# Patient Record
Sex: Male | Born: 1945 | Race: White | Hispanic: No | Marital: Married | Smoking: Former smoker
Health system: Southern US, Community
[De-identification: ages and names within clinical notes are randomized; demographics above are authoritative.]

## PROBLEM LIST (undated history)

## (undated) DIAGNOSIS — K589 Irritable bowel syndrome without diarrhea: Secondary | ICD-10-CM

## (undated) DIAGNOSIS — J309 Allergic rhinitis, unspecified: Secondary | ICD-10-CM

## (undated) DIAGNOSIS — I4891 Unspecified atrial fibrillation: Secondary | ICD-10-CM

## (undated) DIAGNOSIS — K219 Gastro-esophageal reflux disease without esophagitis: Secondary | ICD-10-CM

## (undated) DIAGNOSIS — E785 Hyperlipidemia, unspecified: Secondary | ICD-10-CM

## (undated) DIAGNOSIS — I209 Angina pectoris, unspecified: Secondary | ICD-10-CM

## (undated) DIAGNOSIS — G2581 Restless legs syndrome: Secondary | ICD-10-CM

## (undated) DIAGNOSIS — Z8719 Personal history of other diseases of the digestive system: Secondary | ICD-10-CM

## (undated) DIAGNOSIS — I499 Cardiac arrhythmia, unspecified: Secondary | ICD-10-CM

## (undated) DIAGNOSIS — I739 Peripheral vascular disease, unspecified: Secondary | ICD-10-CM

## (undated) DIAGNOSIS — M1990 Unspecified osteoarthritis, unspecified site: Secondary | ICD-10-CM

## (undated) DIAGNOSIS — J449 Chronic obstructive pulmonary disease, unspecified: Secondary | ICD-10-CM

## (undated) DIAGNOSIS — I2510 Atherosclerotic heart disease of native coronary artery without angina pectoris: Secondary | ICD-10-CM

## (undated) DIAGNOSIS — J45909 Unspecified asthma, uncomplicated: Secondary | ICD-10-CM

## (undated) DIAGNOSIS — I219 Acute myocardial infarction, unspecified: Secondary | ICD-10-CM

## (undated) DIAGNOSIS — R7303 Prediabetes: Secondary | ICD-10-CM

## (undated) DIAGNOSIS — E039 Hypothyroidism, unspecified: Secondary | ICD-10-CM

## (undated) DIAGNOSIS — T7840XA Allergy, unspecified, initial encounter: Secondary | ICD-10-CM

## (undated) DIAGNOSIS — K4190 Unilateral femoral hernia, without obstruction or gangrene, not specified as recurrent: Secondary | ICD-10-CM

## (undated) DIAGNOSIS — I2699 Other pulmonary embolism without acute cor pulmonale: Secondary | ICD-10-CM

## (undated) DIAGNOSIS — J42 Unspecified chronic bronchitis: Secondary | ICD-10-CM

## (undated) DIAGNOSIS — I10 Essential (primary) hypertension: Secondary | ICD-10-CM

## (undated) HISTORY — DX: Allergy, unspecified, initial encounter: T78.40XA

## (undated) HISTORY — DX: Allergic rhinitis, unspecified: J30.9

## (undated) HISTORY — PX: OTHER SURGICAL HISTORY: SHX169

## (undated) HISTORY — PX: CORONARY ANGIOPLASTY: SHX604

## (undated) HISTORY — PX: FEMORAL ARTERY STENT: SHX1583

## (undated) HISTORY — PX: EYE SURGERY: SHX253

## (undated) HISTORY — PX: CORONARY ANGIOPLASTY WITH STENT PLACEMENT: SHX49

## (undated) HISTORY — PX: UPPER GASTROINTESTINAL ENDOSCOPY: SHX188

## (undated) HISTORY — DX: Hyperlipidemia, unspecified: E78.5

## (undated) HISTORY — PX: DOPPLER ECHOCARDIOGRAPHY: SHX263

## (undated) HISTORY — PX: TONSILLECTOMY: SUR1361

---

## 1995-05-19 DIAGNOSIS — I219 Acute myocardial infarction, unspecified: Secondary | ICD-10-CM

## 1995-05-19 DIAGNOSIS — I209 Angina pectoris, unspecified: Secondary | ICD-10-CM

## 1995-05-19 HISTORY — DX: Acute myocardial infarction, unspecified: I21.9

## 1995-05-19 HISTORY — DX: Angina pectoris, unspecified: I20.9

## 1999-04-24 ENCOUNTER — Encounter: Payer: Self-pay | Admitting: Cardiovascular Disease

## 1999-04-24 ENCOUNTER — Ambulatory Visit (HOSPITAL_COMMUNITY): Admission: RE | Admit: 1999-04-24 | Discharge: 1999-04-24 | Payer: Self-pay | Admitting: Cardiovascular Disease

## 1999-12-30 ENCOUNTER — Encounter: Payer: Self-pay | Admitting: Emergency Medicine

## 1999-12-30 ENCOUNTER — Emergency Department (HOSPITAL_COMMUNITY): Admission: EM | Admit: 1999-12-30 | Discharge: 1999-12-30 | Payer: Self-pay | Admitting: Emergency Medicine

## 2002-05-18 HISTORY — PX: CARDIAC CATHETERIZATION: SHX172

## 2002-07-03 ENCOUNTER — Encounter: Payer: Self-pay | Admitting: Emergency Medicine

## 2002-07-03 ENCOUNTER — Inpatient Hospital Stay (HOSPITAL_COMMUNITY): Admission: EM | Admit: 2002-07-03 | Discharge: 2002-07-05 | Payer: Self-pay | Admitting: Emergency Medicine

## 2002-10-22 ENCOUNTER — Encounter: Payer: Self-pay | Admitting: Emergency Medicine

## 2002-10-22 ENCOUNTER — Inpatient Hospital Stay (HOSPITAL_COMMUNITY): Admission: EM | Admit: 2002-10-22 | Discharge: 2002-10-23 | Payer: Self-pay | Admitting: Emergency Medicine

## 2004-12-01 ENCOUNTER — Encounter: Admission: RE | Admit: 2004-12-01 | Discharge: 2004-12-01 | Payer: Self-pay | Admitting: Cardiovascular Disease

## 2004-12-08 ENCOUNTER — Ambulatory Visit (HOSPITAL_COMMUNITY): Admission: RE | Admit: 2004-12-08 | Discharge: 2004-12-09 | Payer: Self-pay | Admitting: Cardiovascular Disease

## 2006-05-20 ENCOUNTER — Ambulatory Visit: Payer: Self-pay | Admitting: Internal Medicine

## 2006-06-21 ENCOUNTER — Ambulatory Visit: Payer: Self-pay | Admitting: Internal Medicine

## 2006-08-16 ENCOUNTER — Ambulatory Visit: Payer: Self-pay | Admitting: Internal Medicine

## 2006-10-18 ENCOUNTER — Ambulatory Visit: Payer: Self-pay | Admitting: Internal Medicine

## 2007-01-11 ENCOUNTER — Observation Stay (HOSPITAL_COMMUNITY): Admission: RE | Admit: 2007-01-11 | Discharge: 2007-01-12 | Payer: Self-pay | Admitting: Cardiovascular Disease

## 2008-06-11 ENCOUNTER — Encounter: Admission: RE | Admit: 2008-06-11 | Discharge: 2008-06-11 | Payer: Self-pay | Admitting: Cardiovascular Disease

## 2008-10-16 HISTORY — PX: HERNIA REPAIR: SHX51

## 2008-10-16 HISTORY — PX: LAPAROSCOPIC CHOLECYSTECTOMY: SUR755

## 2008-11-06 ENCOUNTER — Encounter (INDEPENDENT_AMBULATORY_CARE_PROVIDER_SITE_OTHER): Payer: Self-pay | Admitting: General Surgery

## 2008-11-06 ENCOUNTER — Ambulatory Visit (HOSPITAL_COMMUNITY): Admission: RE | Admit: 2008-11-06 | Discharge: 2008-11-06 | Payer: Self-pay | Admitting: General Surgery

## 2009-03-28 ENCOUNTER — Encounter: Admission: RE | Admit: 2009-03-28 | Discharge: 2009-03-28 | Payer: Self-pay | Admitting: Gastroenterology

## 2009-05-16 ENCOUNTER — Telehealth: Payer: Self-pay | Admitting: Internal Medicine

## 2009-05-21 ENCOUNTER — Encounter: Payer: Self-pay | Admitting: Internal Medicine

## 2009-05-23 ENCOUNTER — Ambulatory Visit: Payer: Self-pay | Admitting: Internal Medicine

## 2009-05-23 DIAGNOSIS — Z720 Tobacco use: Secondary | ICD-10-CM | POA: Insufficient documentation

## 2009-06-26 ENCOUNTER — Ambulatory Visit: Payer: Self-pay | Admitting: Internal Medicine

## 2009-06-26 DIAGNOSIS — R0602 Shortness of breath: Secondary | ICD-10-CM | POA: Insufficient documentation

## 2010-06-17 NOTE — Assessment & Plan Note (Signed)
Summary: 1 month w/pft & smw/apc   Copy to:  Dr. Manson Passey [facility]) Primary Provider/Referring Provider:  Dr. Lawana Pai [facility])  CC:  PFT and SMW results - no complaints.  History of Present Illness: History of Present Illness: 10/21/06 PROBLEM: 1. Asthmatic bronchitis. 2. Esophageal reflux.   HISTORY:  Still with mild congestive cough, productive most days off and on, but does clear his sputum.  No blood, nothing purulent, no chest pain or fever.  He asked for an antibiotic to hold for the summer in case of travel.  He quit Advair partly because of cost and thrush.   May 23, 2009- As/Bronchitis, GERD He had noted more exertional dyspnea in mid December- now improved but still hoarse with some sinus pressure. He declines flu shot- says they make him sick. He had CXR then CT at [facility].- I've reviewed images with him. It looks like pneumonia in left lung at the time. He denies significant fever, chills, sputum. Had scratchy throat. He was given biaxin then injection, prednisone, Avelox. Finished prednisone last night. Chest is better, but head still stuffy. In last 2 days has begun able to cough up some phlegm- gray/ white. Still smokes, estimating 1/2 PPD. We discussed smoking cessation and support. PFT 2008 WNL.  June 26, 2009- Asthmatic bronchitis/ COPD, GERD, Tobacco. CAD Feels well, coming for PFT review. He chooses not to get flu vax- discussed. PFT- WNL despite audible mild wheeze/ rhonchi and his smoking hx. - 97%. 97%, 97% 452 meters. This is a good performance. He notes dry mouth at night- unsure about snoring but assumes mouth breathing.  .  Medications Prior to Update: 1)  Vytorin 10-40 Mg Tabs (Ezetimibe-Simvastatin) .... Take 1 Tablet By Mouth Once A Day 2)  Plavix 75 Mg Tabs (Clopidogrel Bisulfate) .... Take 1 Tablet By Mouth Once A Day 3)  Bystolic 2.5 Mg Tabs (Nebivolol Hcl) .... Take 1 Tablet By Mouth Once A Day 4)  Cozaar 50 Mg Tabs  (Losartan Potassium) .... Take 1 Tablet By Mouth Once A Day 5)  Protonix 40 Mg Tbec (Pantoprazole Sodium) .... Take 1 Tablet By Mouth Once A Day 6)  Ambien 10 Mg Tabs (Zolpidem Tartrate) .... Take 1 Tab By Mouth At Bedtime 7)  Zyrtec Allergy 10 Mg Tabs (Cetirizine Hcl) .... Take 1 Tablet By Mouth Once A Day 8)  Proair Hfa 108 (90 Base) Mcg/act Aers (Albuterol Sulfate) .... 2 Puffs Every 4 To 6 Hours 9)  Atrovent Hfa 17 Mcg/act Aers (Ipratropium Bromide Hfa) .... 2 Puffs Every 4 To 6 Hours 10)  Avelox 400 Mg Tabs (Moxifloxacin Hcl) .... Take 1 Tablet By Mouth Once A Day 11)  Hydromet 5-1.5 Mg/50ml Syrp (Hydrocodone-Homatropine) .... As Directed  Current Medications (verified): 1)  Vytorin 10-40 Mg Tabs (Ezetimibe-Simvastatin) .... Take 1 Tablet By Mouth Once A Day 2)  Plavix 75 Mg Tabs (Clopidogrel Bisulfate) .... Take 1 Tablet By Mouth Once A Day 3)  Bystolic 2.5 Mg Tabs (Nebivolol Hcl) .... Take 1 Tablet By Mouth Once A Day 4)  Cozaar 50 Mg Tabs (Losartan Potassium) .... Take 1 Tablet By Mouth Once A Day 5)  Protonix 40 Mg Tbec (Pantoprazole Sodium) .... Take 1 Tablet By Mouth Once A Day 6)  Ambien 10 Mg Tabs (Zolpidem Tartrate) .... Take 1 Tab By Mouth At Bedtime 7)  Zyrtec Allergy 10 Mg Tabs (Cetirizine Hcl) .... Take 1 Tablet By Mouth Once A Day 8)  Atrovent Hfa 17 Mcg/act Aers (Ipratropium Bromide  Hfa) .... 2 Puffs Every 4 To 6 Hours  Allergies (verified): 1)  ! Sulfa 2)  ! Augmentin 3)  ! Levaquin 4)  ! Mobic (Meloxicam) 5)  ! 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   Risk Factors: Alcohol Use: <1 (05/23/2009)  Risk Factors: Smoking Status: current (05/23/2009) Packs/Day: 0.5 (05/23/2009)  Review of Systems      See HPI  The patient denies anorexia, fever, weight loss, weight gain, vision loss, decreased hearing, hoarseness, chest pain, syncope, dyspnea on exertion, peripheral edema, prolonged cough, headaches, hemoptysis, abdominal pain, and severe indigestion/heartburn.    Vital Signs:  Patient profile:   65 year old male Height:      69.5 inches Weight:      201 pounds BMI:     29.36 O2 Sat:      96 % on Room air Pulse rate:   201 / minute BP sitting:   114 / 66  (left arm) Cuff size:   regular  Vitals Entered By: Boone Master CNA (June 26, 2009 10:45 AM)  O2 Flow:  Room air CC: PFT and SMW results - no complaints Comments Medications reviewed with patient Daytime contact number verified with patient. Boone Master CNA  June 26, 2009 10:46 AM    Physical Exam  Additional Exam:  General: A/Ox3; pleasant and cooperative, NAD, calm SKIN: no rash, lesions NODES: no lymphadenopathy HEENT: [place]/AT, EOM- WNL, Conjuctivae- clear, PERRLA, TM-WNL, Nose- clear, Throat- clear and wnl NECK: Supple w/ fair ROM, JVD- none, normal carotid impulses w/o bruits Thyroid-  CHEST: Wheeze and coarse breath sounds, unlabored HEART: RRR, no m/g/r heard ABDOMEN: Soft and nl; nml bowel sounds; no organomegaly or masses noted UJW:[id number], nl pulses, no edema  NEURO: Grossly intact to observation      Impression & Recommendations:  Problem # 1:  BRONCHITIS, CHRONIC (ICD-491.9)  Mild chronic bronchitis, without impact on PFT. I emphasized his smoking as gradually likely to make his breathing worse and already impacting his coronary disease hx. Smoking cessation reviewed. I offerred to see him as needed or in a year- his choice.  Other Orders: Est. Patient Level III (47829) Tobacco use cessation  intermediate 3-10 minutes (56213)  Patient Instructions: 1)  Please schedule a follow-up appointment as needed. 2)  I'll  be happy to see you as needed.

## 2010-06-17 NOTE — Assessment & Plan Note (Signed)
Summary: SIX MIN WALK  Nurse Visit   Vital Signs:  Patient profile:   65 year old male Pulse rate:   57 / minute BP sitting:   120 / 78  Medications Prior to Update: 1)  Vytorin 10-40 Mg Tabs (Ezetimibe-Simvastatin) .... Take 1 Tablet By Mouth Once A Day 2)  Plavix 75 Mg Tabs (Clopidogrel Bisulfate) .... Take 1 Tablet By Mouth Once A Day 3)  Bystolic 2.5 Mg Tabs (Nebivolol Hcl) .... Take 1 Tablet By Mouth Once A Day 4)  Cozaar 50 Mg Tabs (Losartan Potassium) .... Take 1 Tablet By Mouth Once A Day 5)  Protonix 40 Mg Tbec (Pantoprazole Sodium) .... Take 1 Tablet By Mouth Once A Day 6)  Ambien 10 Mg Tabs (Zolpidem Tartrate) .... Take 1 Tab By Mouth At Bedtime 7)  Zyrtec Allergy 10 Mg Tabs (Cetirizine Hcl) .... Take 1 Tablet By Mouth Once A Day 8)  Proair Hfa 108 (90 Base) Mcg/act Aers (Albuterol Sulfate) .... 2 Puffs Every 4 To 6 Hours 9)  Atrovent Hfa 17 Mcg/act Aers (Ipratropium Bromide Hfa) .... 2 Puffs Every 4 To 6 Hours 10)  Avelox 400 Mg Tabs (Moxifloxacin Hcl) .... Take 1 Tablet By Mouth Once A Day 11)  Hydromet 5-1.5 Mg/40ml Syrp (Hydrocodone-Homatropine) .... As Directed  Allergies: 1)  ! Sulfa 2)  ! Augmentin 3)  ! Levaquin 4)  ! Mobic (Meloxicam) 5)  ! Skelaxin (Metaxalone)  Orders Added: 1)  Pulmonary Stress (6 min walk) [94620]   Six Minute Walk Test Medications taken before test(dose and time): 1)  Vytorin 10-40 Mg Tabs (Ezetimibe-Simvastatin) .... Take 1 Tablet By Mouth Once A Day 2)  Plavix 75 Mg Tabs (Clopidogrel Bisulfate) .... Take 1 Tablet By Mouth Once A Day 3)  Bystolic 2.5 Mg Tabs (Nebivolol Hcl) .... Take 1 Tablet By Mouth Once A Day 4)  Cozaar 50 Mg Tabs (Losartan Potassium) .... Take 1 Tablet By Mouth Once A Day 5)  Protonix 40 Mg Tbec (Pantoprazole Sodium) .... Take 1 Tablet By Mouth Once A Day 7)  Zyrtec Allergy 10 Mg Tabs (Cetirizine Hcl) .... Take 1 Tablet By Mouth Once A Day 10)  Avelox 400 Mg Tabs (Moxifloxacin Hcl) .... Take 1 Tablet By Mouth  Once A Day 11)  Hydromet 5-1.5 Mg/52ml Syrp (Hydrocodone-Homatropine) .... As Directed Pt took theses meds at 7:30 am  Supplemental oxygen during the test: No  Lap counter(place a tick mark inside a square for each lap completed) lap 1 complete  lap 2 complete   lap 3 complete   lap 4 complete  lap 5 complete  lap 6 complete  lap 7 complete   lap 8 complete   lap 9 complete   Baseline  BP sitting: 120/ 78 Heart rate: 57 Dyspnea ( Borg scale) 0 Fatigue (Borg scale) 0 SPO2 97  End Of Test  BP sitting: 128/ 78 Heart rate: 79 Dyspnea ( Borg scale) 0.5 Fatigue (Borg scale) 0 SPO2 97  2 Minutes post  BP sitting: 122/ 74 Heart rate: 61 SPO2 97  Stopped or paused before six minutes? No  Interpretation: Number of laps  9 X 48 meters =   432 meters+ final partial lap: 20 meters =    452 meters   Total distance walked in six minutes: 452 meters  Tech ID: Tivis Ringer, CNA (June 26, 2009 10:18 AM) Jeremy Johann Comments Pt completed test w/ 0 rest breaks and 0 complaints.

## 2010-06-17 NOTE — Miscellaneous (Signed)
Summary: Orders Update pft charges  Clinical Lists Changes  Orders: Added new Service order of Carbon Monoxide diffusing w/capacity (94720) - Signed Added new Service order of Lung Volumes (94240) - Signed Added new Service order of Spirometry (Pre & Post) (94060) - Signed 

## 2010-06-17 NOTE — Assessment & Plan Note (Signed)
Summary: follow up per Dr. Cecille Amsterdam phone note/kcw   Copy to:  Dr. Manson Passey [facility]) Primary Provider/Referring Provider:  Dr. Lawana Pai [facility])  CC:  Pt here for follow up on scan. Pt c/o S.O.B with activity since 05/07/2009.  History of Present Illness: 10/21/06 PROBLEM: 1. Asthmatic bronchitis. 2. Esophageal reflux.   HISTORY:  Still with mild congestive cough, productive most days off and on, but does clear his sputum.  No blood, nothing purulent, no chest pain or fever.  He asked for an antibiotic to hold for the summer in case of travel.  He quit Advair partly because of cost and thrush.   May 23, 2009- As/Bronchitis, GERD He had noted more exertional dyspnea in mid December- now improved but still hoarse with some sinus pressure. He declines flu shot- says they make him sick. He had CXR then CT at [facility].- I've reviewed images with him. It looks like pneumonia in left lung at the time. He denies significant fever, chills, sputum. Had scratchy throat. He was given biaxin then injection, prednisone, Avelox. Finished prednisone last night. Chest is better, but head still stuffy. In last 2 days has begun able to cough up some phlegm- gray/ white. Still smokes, estimating 1/2 PPD. We discussed smoking cessation and support. PFT 2008 WNL. [name]  Preventive Screening-Counseling & Management  Alcohol-Tobacco     Alcohol drinks/day: <1     Alcohol type: all     Smoking Status: current     Packs/Day: 0.5     Year Started: 1960  Current Medications (verified): 1)  Vytorin 10-40 Mg Tabs (Ezetimibe-Simvastatin) .... Take 1 Tablet By Mouth Once A Day 2)  Plavix 75 Mg Tabs (Clopidogrel Bisulfate) .... Take 1 Tablet By Mouth Once A Day 3)  Bystolic 2.5 Mg Tabs (Nebivolol Hcl) .... Take 1 Tablet By Mouth Once A Day 4)  Cozaar 50 Mg Tabs (Losartan Potassium) .... Take 1 Tablet By Mouth Once A Day 5)  Protonix 40 Mg Tbec (Pantoprazole Sodium) .... Take 1 Tablet By Mouth  Once A Day 6)  Ambien 10 Mg Tabs (Zolpidem Tartrate) .... Take 1 Tab By Mouth At Bedtime 7)  Zyrtec Allergy 10 Mg Tabs (Cetirizine Hcl) .... Take 1 Tablet By Mouth Once A Day 8)  Proair Hfa 108 (90 Base) Mcg/act Aers (Albuterol Sulfate) .... 2 Puffs Every 4 To 6 Hours 9)  Atrovent Hfa 17 Mcg/act Aers (Ipratropium Bromide Hfa) .... 2 Puffs Every 4 To 6 Hours 10)  Avelox 400 Mg Tabs (Moxifloxacin Hcl) .... Take 1 Tablet By Mouth Once A Day 11)  Hydromet 5-1.5 Mg/9ml Syrp (Hydrocodone-Homatropine) .... As Directed  Allergies (verified): 1)  ! Sulfa 2)  ! Augmentin 3)  ! Levaquin 4)  ! Mobic (Meloxicam) 5)  ! 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  Smoking Status:  current Packs/Day:  0.5 Alcohol drinks/day:  <1  Review of Systems  See HPI       The patient complains of dyspnea on exertion and prolonged cough.  The patient denies anorexia, fever, weight loss, weight gain, vision loss, decreased hearing, hoarseness, chest pain, syncope, peripheral edema, hemoptysis, abdominal pain, and severe indigestion/heartburn.    Vital Signs:  Patient profile:   65 year old male Height:      69.5 inches Weight:      203.38 pounds BMI:     29.71 O2 Sat:       96 % on Room air Pulse rate:   65 / minute BP sitting:   122 / 72  (left arm) Cuff size:   regular  Vitals Entered By: Zackery Barefoot CMA (May 23, 2009 9:31 AM)  O2 Flow:  Room air  CC: Pt here for follow up on scan. Pt c/o S.O.B with activity since 05/07/2009 Comments Medications reviewed with patient Zackery Barefoot CMA  May 23, 2009 9:32 AM    Physical Exam  Additional Exam:  General: A/Ox3; pleasant and cooperative, NAD, calm SKIN: no rash, lesions NODES: no lymphadenopathy HEENT: [place]/AT, EOM- WNL, Conjuctivae- clear, PERRLA, TM-WNL, Nose- clear, Throat- clear and wnl NECK: Supple w/ fair ROM, JVD- none, normal carotid impulses w/o bruits Thyroid-  CHEST: Wheeze aqnbd coarse breath sounds HEART: RRR, no m/g/r heard ABDOMEN: Soft and nl; nml bowel sounds; no organomegaly or masses noted VPX:TGGY, nl pulses, no edema  NEURO: Grossly intact to observation      Impression & Recommendations:  Problem # 1:  BRONCHITIS, CHRONIC (ICD-491.9)  Probable acute viral type exacerbation superimposed on chronic tobacco abuse with mild chronic bronchitis. It is time to get new PFT for comparison with 3 years ago. He has a mild residual rhinitis that can be treated symptomatically  Problem # 2:  TOBACCO ABUSE (ICD-305.1)  Cessation discussed  Medications Added to Medication List This Visit: 1)  Vytorin 10-40 Mg Tabs (Ezetimibe-simvastatin) .... Take 1 tablet by mouth once a day 2)  Plavix 75 Mg Tabs (Clopidogrel bisulfate) .... Take 1 tablet by mouth once a day 3)  Bystolic 2.5 Mg Tabs (Nebivolol hcl) .... Take 1 tablet by mouth once a day 4)  Cozaar 50 Mg Tabs (Losartan potassium) .... Take 1 tablet by mouth once a day 5)  Protonix 40 Mg Tbec (Pantoprazole sodium) .... Take 1 tablet by mouth once a day 6)  Ambien 10 Mg Tabs (Zolpidem tartrate) .... Take 1 tab by mouth at bedtime 7)  Zyrtec Allergy 10 Mg Tabs (Cetirizine hcl) .... Take 1 tablet by mouth once a  day 8)  Proair Hfa 108 (90 Base) Mcg/act Aers (Albuterol sulfate) .... 2 puffs every 4 to 6 hours 9)  Atrovent Hfa 17 Mcg/act Aers (Ipratropium bromide hfa) .... 2 puffs every 4 to 6 hours 10)  Avelox 400 Mg Tabs (Moxifloxacin hcl) .... Take 1 tablet by mouth once a day 11)  Hydromet 5-1.5 Mg/81ml Syrp (Hydrocodone-homatropine) .... As directed  Other Orders: Est. Patient Level III (69485) Tobacco use cessation intermediate 3-10 minutes (46270)  Patient Instructions: 1)  Please schedule a follow-up appointment in 1 month. 2)  Schedule PFT and 6 MWT 3)   Consider Sudafed-PE as an otc decongestant for head congestion.

## 2010-06-17 NOTE — Progress Notes (Signed)
Summary: dr brown called- requests f/u appt for pt  Phone Note From Other Clinic   Caller: dr Gillis Ends Call For: Sims Laday Summary of Call: dr states that pt's COPD has worsened. sats were 91 % today. dr states that pt had an abnormal cxr and is also scheduled for a CT on 05/21/2009. dr brown would like dr Durward Matranga to see pt after that time (although not the same day). please call office at [phone] and ask for clinical staff (anyone) and give them date/ time of appt w/ cy. note: dr Anaya Bovee last saw pt in 2008.  Initial call taken by: Tivis Ringer,  May 16, 2009 3:45 PM  Follow-up for Phone Call        [facility]. Carron Curie CMA  May 16, 2009 3:49 PM  CY had an opening on 1/4/1 at 2:30 but pt had CT scan scheduled for that time, so I scheduled him to see CY on 05/23/09 at 11:15. Pt aware to bring CT scan with him.Carron Curie CMA  May 16, 2009 4:52 PM

## 2010-06-25 IMAGING — RF DG ESOPHAGUS
12 of 14 series · 19 of 24 positions shown · non-contrast
Comparison: Chest radiograph 11/05/2008

CLINICAL DATA: Dysphagia

ESOPHOGRAM/BARIUM SWALLOW
TECHNIQUE: Combined double contrast and single contrast
examination performed using effervescent crystals, thick barium
liquid, and thin barium liquid.
Fluoroscopy time:  2.3 minutes.

[Series 1: run · 4 of 7 slices shown (1 of 12)]
[im 1/7]
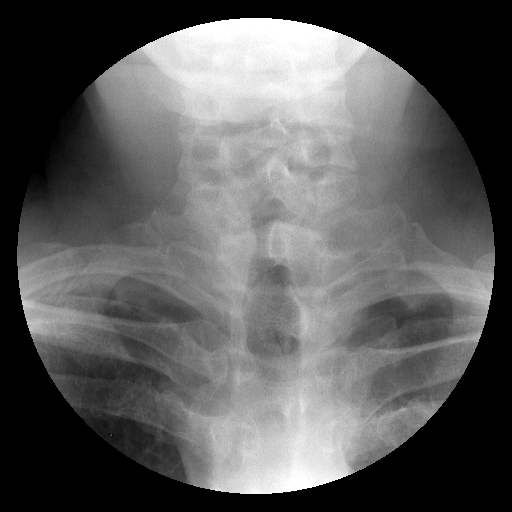
[im 2/7]
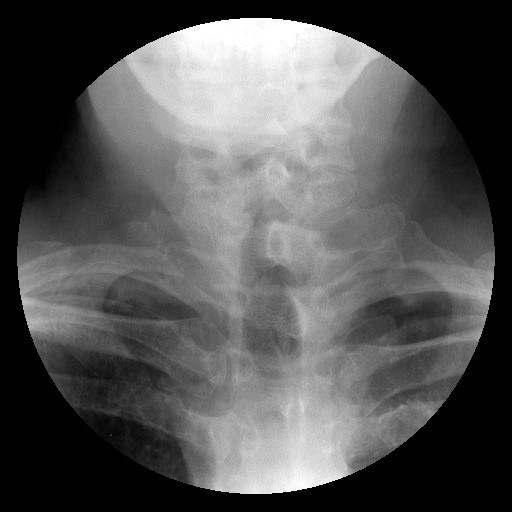
[im 5/7]
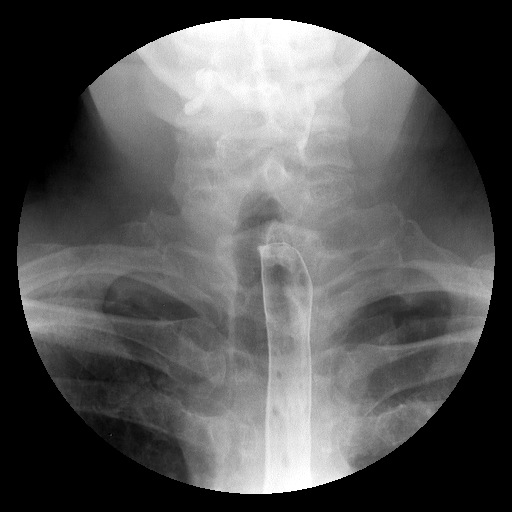
[im 7/7]
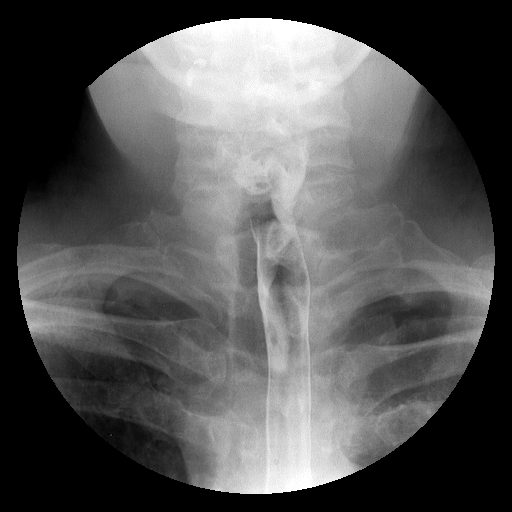

[Series 2: run · 5 of 9 slices shown (2 of 12)]
[im 1/9]
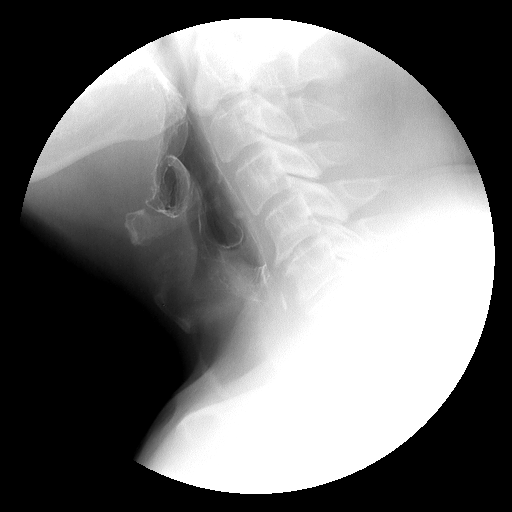
[im 2/9]
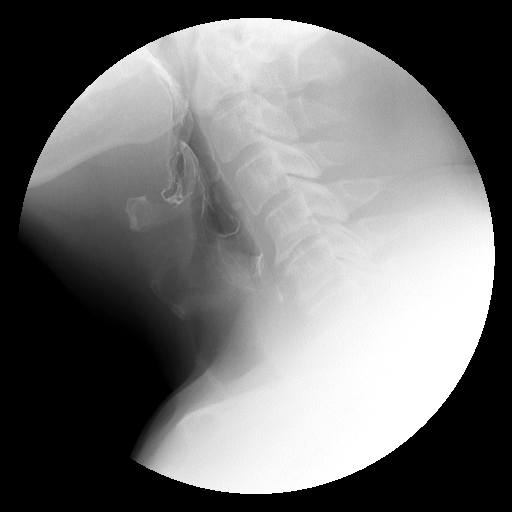
[im 5/9]
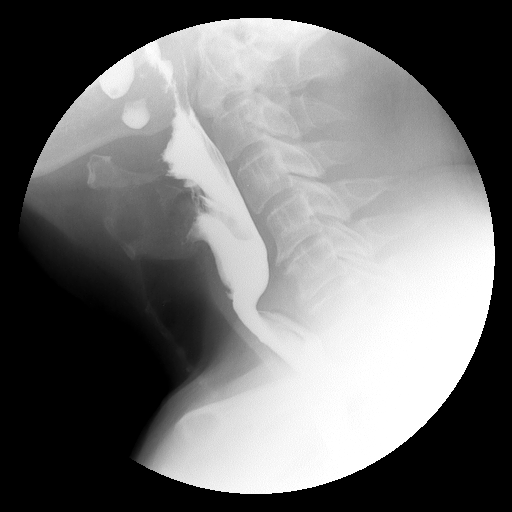
[im 6/9]
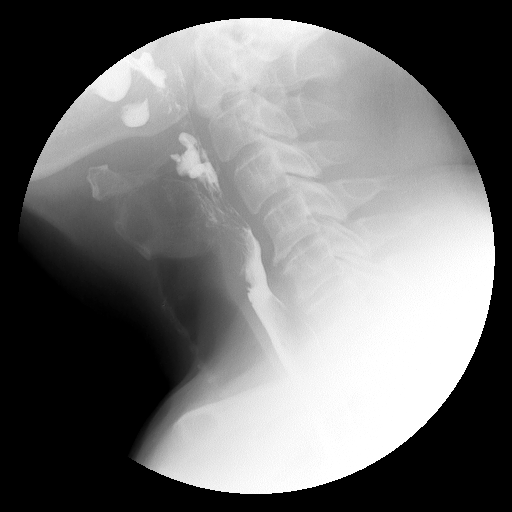
[im 7/9]
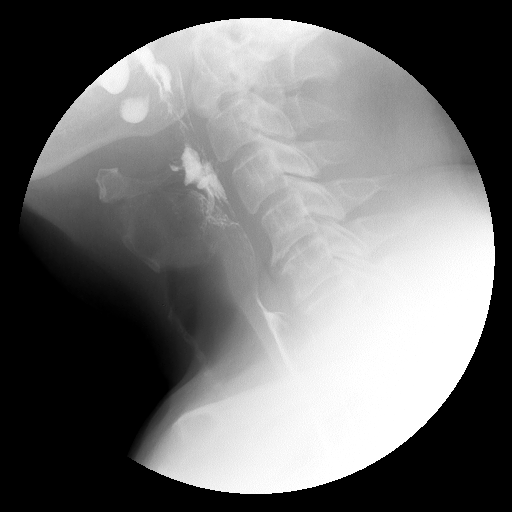

[Series 3: run · 1 of 1 slices shown (3 of 12)]
[im 1/1]
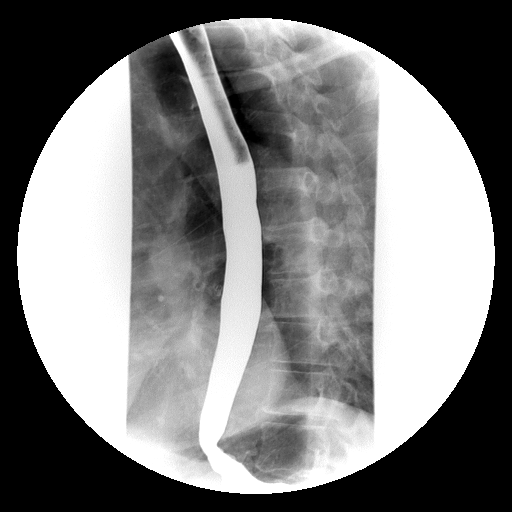

[Series 4: run · 1 of 1 slices shown (4 of 12)]
[im 1/1]
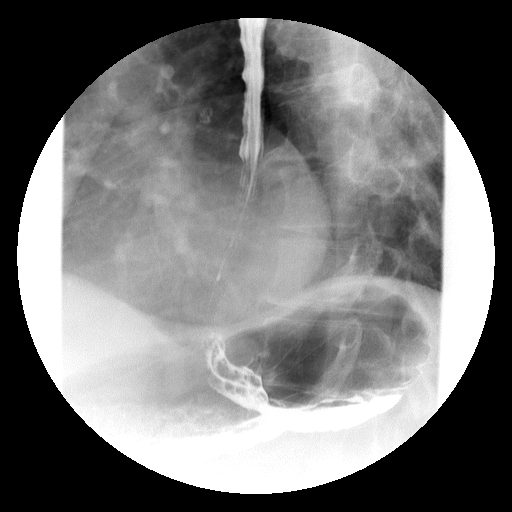

[Series 5: run · 1 of 1 slices shown (5 of 12)]
[im 1/1]
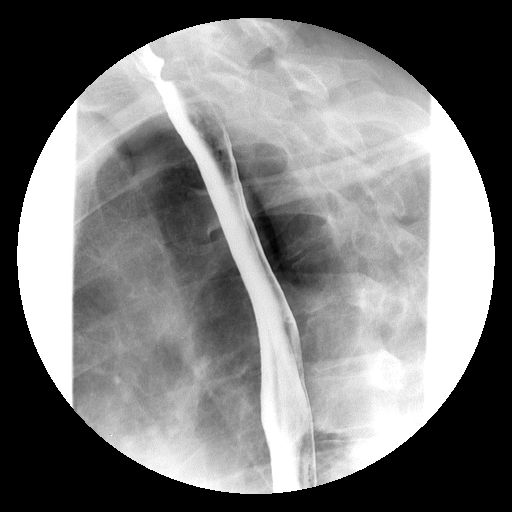

[Series 6: run · 1 of 1 slices shown (6 of 12)]
[im 1/1]
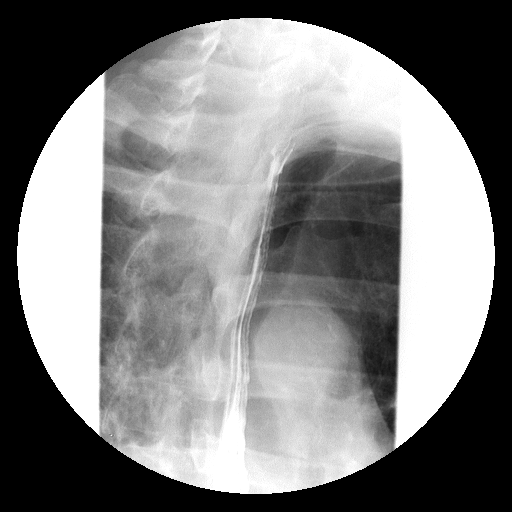

[Series 8: run · 1 of 1 slices shown (7 of 12)]
[im 1/1]
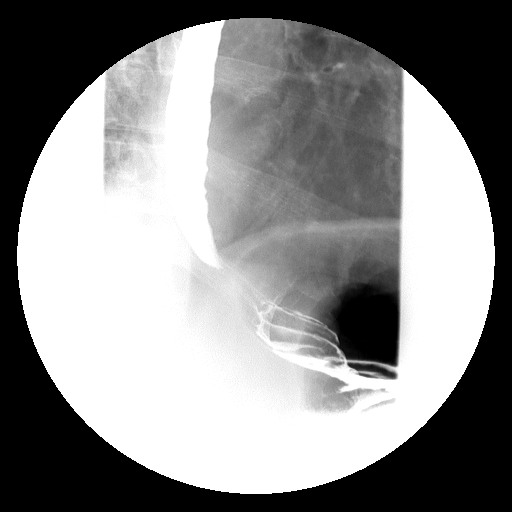

[Series 9: run · 1 of 1 slices shown (8 of 12)]
[im 1/1]
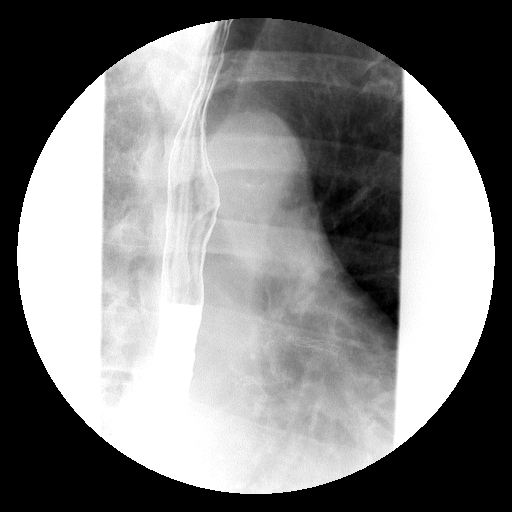

[Series 10: run · 1 of 1 slices shown (9 of 12)]
[im 1/1]
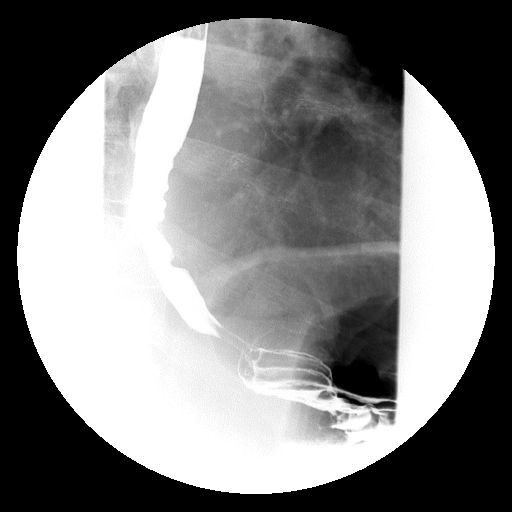

[Series 11: run · 1 of 1 slices shown (10 of 12)]
[im 1/1]
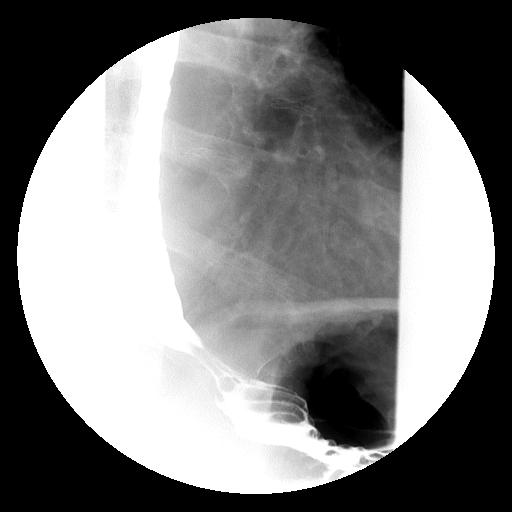

[Series 13: run · 1 of 1 slices shown (11 of 12)]
[im 1/1]
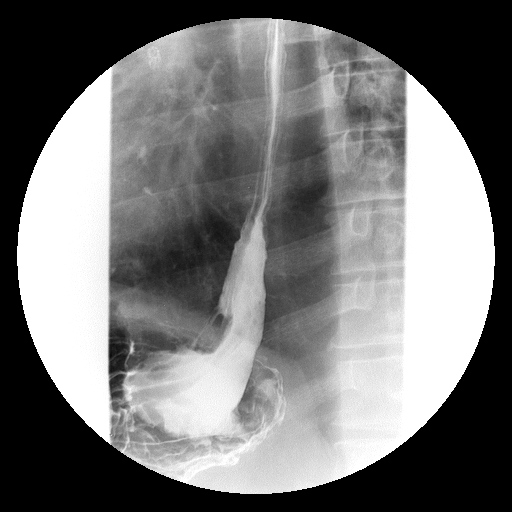

[Series 14: run · 1 of 1 slices shown (12 of 12)]
[im 1/1]
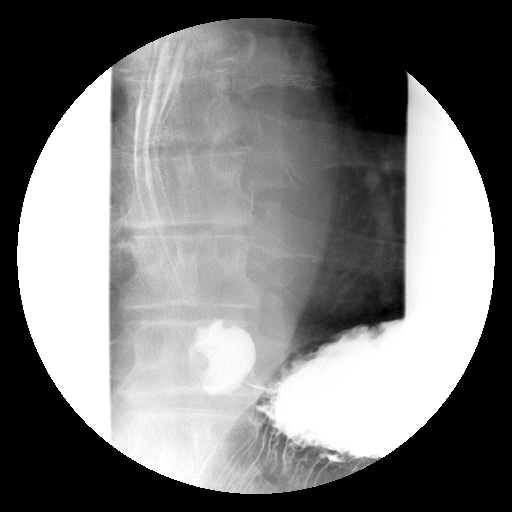

[19 of 24 positions shown; findings below may reference images not displayed]

FINDINGS: No evidence of swallowing dysfunction.  No evidence of
stricture or mass in the high cervical esophagus.  There is no
esophageal mucosal irregularity within the thoracic esophagus,
distal esophagus, or gastroesophageal junction.  A small hiatal
hernia was demonstrated.  13 mm barium tablet passed GE junction
easily.

Mild esophageal dysmotility was observed with the barium bolus
escaping the primary stripping wave.  There was subsequent high
reflux of contrast into the cervical esophagus.  No tertiary
contractions noted.  Mild gastroesophageal reflux is also
demonstrated.
IMPRESSION: 1.  No evidence of mucosal irregularity within the esophagus.
2.  Small hiatal hernia.
3.  Mild esophageal reflux.
4.  Mild esophageal dysmotility resulting in  reflux of barium into
the cervical esophagus.

## 2010-08-25 LAB — DIFFERENTIAL
Basophils Absolute: 0.1 10*3/uL (ref 0.0–0.1)
Basophils Relative: 1 % (ref 0–1)
Eosinophils Absolute: 0.7 10*3/uL (ref 0.0–0.7)
Eosinophils Relative: 6 % — ABNORMAL HIGH (ref 0–5)
Lymphocytes Relative: 22 % (ref 12–46)
Lymphs Abs: 2.7 10*3/uL (ref 0.7–4.0)
Monocytes Absolute: 0.9 10*3/uL (ref 0.1–1.0)
Monocytes Relative: 8 % (ref 3–12)
Neutro Abs: 7.6 10*3/uL (ref 1.7–7.7)
Neutrophils Relative %: 64 % (ref 43–77)

## 2010-08-25 LAB — CBC
HCT: 42 % (ref 39.0–52.0)
Hemoglobin: 14.4 g/dL (ref 13.0–17.0)
MCHC: 34.3 g/dL (ref 30.0–36.0)
MCV: 87.2 fL (ref 78.0–100.0)
Platelets: 197 10*3/uL (ref 150–400)
RBC: 4.81 MIL/uL (ref 4.22–5.81)
RDW: 14 % (ref 11.5–15.5)
WBC: 11.9 10*3/uL — ABNORMAL HIGH (ref 4.0–10.5)

## 2010-08-25 LAB — COMPREHENSIVE METABOLIC PANEL
ALT: 27 U/L (ref 0–53)
AST: 21 U/L (ref 0–37)
Albumin: 3.5 g/dL (ref 3.5–5.2)
Alkaline Phosphatase: 84 U/L (ref 39–117)
BUN: 14 mg/dL (ref 6–23)
CO2: 29 mEq/L (ref 19–32)
Calcium: 9.2 mg/dL (ref 8.4–10.5)
Chloride: 108 mEq/L (ref 96–112)
Creatinine, Ser: 1.18 mg/dL (ref 0.4–1.5)
GFR calc Af Amer: >60 mL/min (ref >60)
GFR calc non Af Amer: >60 mL/min (ref >60)
Glucose, Bld: 98 mg/dL (ref 70–99)
Potassium: 4.5 mEq/L (ref 3.5–5.1)
Sodium: 140 mEq/L (ref 135–145)
Total Bilirubin: 0.6 mg/dL (ref 0.3–1.2)
Total Protein: 6.2 g/dL (ref 6.0–8.3)

## 2010-09-30 NOTE — Cardiovascular Report (Signed)
Brian Brock, PARRILLO NO.:  [id number]   MEDICAL RECORD NO.:  [id number]          PATIENT TYPE:  OBV   LOCATION:  2037                         FACILITY:  [facility]   PHYSICIAN:  Nanetta Batty, M.D.   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 Alanda Amass, now with  recurrent claudication symptoms and Doppler suggesting right SFA  stenosis.  The remainder of his history can be obtained from Dr.  Kandis Cocking diagnostic angiogram report.  Angiogram performed by Dr.  Alanda Amass revealed a 70% segmental mid right SFA stenosis with a patent  left SFA stent.   DESCRIPTION OF PROCEDURE:  The patient received 2500 units of heparin  intravenously.  Contralateral access was obtained with a 6-French  crossover sheath.  A Wholey wire was then advanced across the right SFA  lesion and PTA was performed with a 4 x 2 Powerflex.  Stenting was  performed with a 7 x 4 Smart stent and post dilatation with a 6 x.4  Powerflex at nominal pressures, resulting in reduction of 70% segmental  mid right SFA stenosis to 0% residual.  The patient tolerated the  procedure well.  The diagonal wire was removed and the sheath was  withdrawn back across the iliac bifurcation.  It will be removed if the  ACT is below 200.  Pressure will be on the groin to achieve hemostasis.  The patient left the lab in stable condition.  He will be hydrated,  discharged home the morning and we will get followup Dopplers and ABIs,  after which he will be seen by Dr. Dr. Alanda Amass in the office for  followup.      Nanetta Batty, M.D.  Electronically Signed     JB/MEDQ  D:  01/11/2007  T:  01/12/2007  Job:  [id number]   cc:   [facility] 2nd Floor PV Angiographic Suite  Richard A. Alanda Amass, M.D.  Roney Marion MD  [name], MD, FCCP, FACP

## 2010-09-30 NOTE — Assessment & Plan Note (Signed)
 HEALTHCARE                             PULMONARY OFFICE NOTE   Brian Brock, Brian Brock                    MRN:          [id number]  DATE:10/18/2006                            DOB:          [birth year]    PROBLEM:  1. Asthmatic bronchitis.  2. Esophageal reflux.   HISTORY:  Still with mild congestive cough, productive most days off and  on, but does clear his sputum.  No blood, nothing purulent, no chest  pain or fever.  He asked for an antibiotic to hold for the summer in  case of travel.  He quit Advair partly because of cost and thrush.   MEDICATIONS:  1. Vytorin 10/20 mg.  2. Metoprolol 12.5 mg.  3. Plavix 75 mg.  4. Cozaar 50 mg.  5. AcipHex 10 mg.  6. Zyrtec.  7. Asmanex 1 puff daily.  8. P.r.n. use of Ambien 10 mg.  9. Flomax.  10.Mucinex DM.   Drug intolerant to Sulfa.   OBJECTIVE:  Weight 207 pounds, blood pressure 110/71, pulse 51, room air  saturation 97%.  Breath sounds are diminished with a few crackles.  Work up breathing is  not increased, there is no dullness.  HEART SOUNDS:  Regular and normal.  I find no adenopathy or edema.   IMPRESSION:  Chronic asthmatic bronchitis noting that pulmonary function  tests showed only mild slowly and small airway flows in February.   PLAN:  Try changing Asmanex to Symbicort 160/4.5 mg 2 puffs b.i.d. with  mouth care.  Doxycycline 100 mg for 7 days to hold.  Schedule return in  4 months, earlier p.r.n.     [name], MD, Brian Brock, FACP  Electronically Signed    CDY/MedQ  DD: 10/21/2006  DT: 10/21/2006  Job #: [id number]   cc:   Roney Marion  Richard A. Alanda Amass, M.D.

## 2010-09-30 NOTE — Op Note (Signed)
NAMECASTER, FAYETTE             ACCOUNT NO.:  [id number]   MEDICAL RECORD NO.:  [id number]          PATIENT TYPE:  AMB   LOCATION:  SDS                          FACILITY:  [facility]   PHYSICIAN:  Richard A. Alanda Amass, M.D.DATE OF BIRTH:  February 19, 1946   DATE OF PROCEDURE:  01/11/2007  DATE OF DISCHARGE:                               OPERATIVE REPORT   PROCEDURE:  Retrograde abdominal aortic catheterization, abdominal  aortic angiogram, midstream PA projection, bilateral iliac angiogram  midstream PA projection, left lower extremity runoff using DSA and step  table imaging, right lower extremity runoff using step table and DSA  imaging.   BRIEF HISTORY AND INDICATIONS FOR PROCEDURE:  Mr. Viney is a long  term patient of mine who is 65 years of age married with one child and  two grandchildren.  He still smokes about a half a pack a day and has  hyperlipidemia, COPD, chronic bronchitis.  He is trying quit smoking.  He has had a negative sleep study in the past.  He has coronary artery  disease which is stable and peripheral arterial disease.  Last  catheterization in June 2004 showed widely patent LAD stent, widely  patent proximal circumflex stent, widely patent proximal LAD and  proximal RCA and mid RCA stents with EF of 55%, mild anterolateral  hypokinesis.  He has had a follow up negative Cardiolite for ischemia as  an outpatient on May 24, 2006, and has no angina.  He has known  peripheral arterial disease and underwent left SFA nitinol self  expanding Smart stenting with PTA on December 08, 2004, by me with excellent  angiographic result of the mid left SFA.  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  The patient was brought in for diagnostic  angiography and possible PTA in this setting.  Informed consent was  obtained to proceed with peripheral angiography and with audio and video  taping.   DESCRIPTION OF PROCEDURE:  The patient was brought to the [facility] second  floor lab in a post absorptive state after 5 mg Valium p.o.  premedication.  Preoperative laboratory was normal with creatinine of 1  and BUN 19 with normal coags.  Both groins were prepped and draped in  the usual manner.  1% Xylocaine was used for local anesthesia.  The LCF  was entered with a single anterior puncture using an 18 thin wall needle  and a 6 French short side arm sheath was inserted without difficulty  with the J-tip Teflon coated guidewire.  Abdominal aortic angiogram was  done through a 5 French pigtail catheter at 20 mL 20 mL per second with  the automated Medrad injection above the level of the renal arteries.  A  second injection was done above the iliac bifurcation at the same  settings.  Because of the patient's size and field of view, the left  lower extremity runoffs and right lower extremity  runoffs were done  separately.  The catheter was removed, left lower extremity runoff was  done through the side arm sheath at 35, 5 mL per second, with  visualization to the left foot.  The right iliac was then accessed with  the IMA catheter and a Wholey wire and the sheath and catheter exchanged  for a 6 French Terumo crossover sheath which was stable in the REIA.  Right lower extremity runoff was done at the same settings using DSA and  step table imaging in a slightly oblique projection.  The patient was  given 2 mg of Versed at the beginning of the procedure for sedation.  The side arm sheath was flushed pending review of his cineangiograms  essentially showing progression of disease in the mid right SFA and he  will be prepared for PPI with Dr. Allyson Sabal after further review.   Arterial pressures were monitored throughout the procedure and ranged   100-110.  Fluoroscopy showed good position with no fracture of his left  mid SFA stent from his prior intervention.   Abdominal aortic angiogram in the midstream PA projection showed patent  SMA and celiac axis.  Single normal renal arteries bilaterally which  were smooth.  The infrarenal abdominal aorta had mild irregularity in  the distal third and no stenosis or aneurysm.  There was minimal  calcification of the proximal common iliacs bilaterally and proximally  eccentric 30-40% RCIA narrowing and 30% LCIA narrowing with good  residual lumen.  The hypogastrics were intact bilaterally and the  external iliacs were widely patent and smooth.   Left SFA profunda junction was intact.  There was approximately 30%  irregularity and narrowing within the previously placed mid left SFA  stent that was felt to be minor angiographically.  There was excellent  runoff with normal popliteal and there was good three vessel runoff to  the left foot.   The right SFA profunda junction showed a normal profunda.  There was 30%  tandem narrowing in the proximal right SFA.  There was 70-80% segmental  narrowing of the mid right SFA with mild calcification.  The flow was  slow beyond this and the popliteal had no significant stenosis and there  was slow three vessel runoff to the right foot as noted on previous  angiography.   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  He is a  candidate for a PPI and stenting with probable balloon dilatation and  stenting of his right mid SFA.  He does not have any restenosis of the  left SFA.  Further attempts at cigarettes cessation should be made along  with continued medical therapy.   CATHETERIZATION DIAGNOSIS:  1. Peripheral arterial disease - recurrent claudication, right lower      extremity with increased Doppler velocities compatible with      progression of stenosis -  symptomatic.  2. Prior nitinol self-expanding left mid SFA Smart stenting December 08, 2004, with good long term result patent on this study.  3. Negative Cardiolite for ischemia January 2008 with known coronary      disease.  4. Prior PCI with three vessel coronary stenting and last follow up      angiography October 23, 2002, patent proximal LAD, patent circumflex,      and patent proximal and mid RCA stents.  5. Hyperlipidemia.  6. Systemic hypertension.  7. Cigarette abuse,  continued.  8. Chronic obstructive pulmonary disease, chronic bronchitis.      Richard A. Alanda Amass, M.D.  Electronically Signed     RAW/MEDQ  D:  01/11/2007  T:  01/11/2007  Job:  [id number]   cc:   Nanetta Batty, M.D.  [name], MD, FCCP, FACP

## 2010-09-30 NOTE — Op Note (Signed)
Brian Brock, NAVARRETTE             ACCOUNT NO.:  [id number]   MEDICAL RECORD NO.:  [id number]          PATIENT TYPE:  AMB   LOCATION:  SDS                          FACILITY:  [facility]   PHYSICIAN:  Ollen Gross. Vernell Morgans, M.D. DATE OF BIRTH:  1945/10/03   DATE OF PROCEDURE:  11/06/2008  DATE OF DISCHARGE:  11/06/2008                               OPERATIVE REPORT   PREOPERATIVE DIAGNOSIS:  Gallstones.   POSTOPERATIVE DIAGNOSES:  Gallstones and umbilical hernia.   PROCEDURE:  1. Laparoscopic cholecystectomy with intraoperative cholangiogram.  2. Umbilical hernia repair.   SURGEON:  Ollen Gross. Vernell Morgans, MD   ASSISTANT:  Ardeth Sportsman, MD   ANESTHESIA:  General endotracheal.   PROCEDURE:  After informed consent was obtained, the patient was brought  to the operating room and placed in the supine position on the operating  room table.  After adequate induction of general anesthesia, the  patient's abdomen was prepped with Betadine and draped in the usual  sterile manner.  The area below the umbilicus was infiltrated with 0.25%  Marcaine.  A small incision was made with 15 blade knife.  This incision  was carried down through the subcutaneous tissue bluntly with hemostat  and [employer] retractors until the linea alba was identified.  The linea  alba was incised with a 15 blade knife and each side was grasped with  Kocher clamps and elevated anteriorly.  The preperitoneal space was then  probed bluntly with a hemostat until the peritoneum was opened and  access was gained to the abdominal cavity.  A 0 Vicryl purse-string  stitch was placed in the fascia around the opening.  Hasson cannula was  placed through the opening and anchored in place with the previously  placed Vicryl purse-string stitch.  The abdomen was then insufflated  with carbon dioxide without difficulty.  The patient was placed in head-  up position, rotated slightly with the right side up.  Next, the  laparoscope was inserted  through the Hasson cannula.  The right upper  quadrant was inspected.  The dome of gallbladder and liver were readily  identified.  The epigastric region was then infiltrated with 0.25%  Marcaine.  A small incision was made with a 15 blade knife.  A 10-mm  port was placed bluntly through this incision into the abdominal cavity  under direct vision.  Sites were then chosen laterally on the right side  of the abdomen with placement of 5-mm ports.  Each of these areas was  infiltrated with 0.25% Marcaine.  Small stab incisions were made with a  15 blade knife and 5-mm ports were placed bluntly through these  incisions into the abdominal cavity under direct vision.  A blunt  grasper was placed through the lateral-most 5-mm port and used to grasp  the dome of gallbladder and elevated anteriorly and superiorly.  Another  blunt grasper was placed through the other 5-mm port and used to retract  on the body and neck of the gallbladder and dissectors were placed  through the epigastric port using electrocautery.  The peritoneal  reflection was opened at  the gallbladder neck area.  Blunt dissection  was then carried out in this area until the gallbladder neck and cystic  duct junction was readily identified and a good window was created.  A  single clip was placed on the gallbladder neck.  A small ductotomy was  made with a laparoscopic scissors.  A Cook catheter was then placed  percutaneously through the anterior abdominal wall under direct vision.  The [facility] catheter was flushed and then inserted into the cystic duct,  anchored in place with the clip.  Cholangiogram was obtained that showed  no filling defects, good emptying into the duodenum, and adequate length  on the cystic duct.  Anchoring clip and catheters were removed from the  patient.  Three clips were placed proximally on the cystic duct and duct  was divided between the 2 sets of clips.  Posterior to this, the cystic  artery was  identified and again dissected bluntly in a circumferential  manner until a good window was created.  Two clips were placed  proximally and distally on the artery and the artery was divided between  the 2.  Next, a laparoscopic hook cautery device was used to separate  the gallbladder from liver bed prior to completely detaching the  gallbladder from the liver bed.  The liver bed was inspected and several  small bleeding points were coagulated with electrocautery and the small  vessel was controlled with another clip.  The gallbladder was detached  and wrested away from the liver bed without difficulty.  The laparoscope  was then moved to the epigastric port.  A gallbladder grasper was placed  through the Hasson Cannula and used to grasp the neck of the  gallbladder.  The gallbladder was then removed through the  infraumbilical port with the Hasson cannula without difficulty.  The  fascial defect was closed with the previously placed Vicryl purse-string  stitch as well as another figure-of-eight 0 Vicryl stitch.  The patient  also had a little umbilical hernia just above this and the subcutaneous  dissection was extended sharply with Metzenbaum scissors until the  hernia was opened.  The preperitoneal fat was reduced and the fascial  defect was closed with a couple of figure-of-eight 0 Vicryl stitches as  well.  The abdomen was then irrigated with copious amounts of saline  until the effluent was clear.  The liver bed was inspected again and  found to be hemostatic.  The ports were then all removed under direct  vision.  The gas was allowed to escape.  The skin incisions were all  closed with interrupted 4-0 Monocryl subcuticular stitches.  Dermabond  dressings were applied.  The patient tolerated the procedure well.  At  the end of the case, all needle, sponge, and instrument counts were  correct.      Ollen Gross. Vernell Morgans, M.D.  Electronically Signed     PST/MEDQ  D:  11/06/2008  T:   11/06/2008  Job:  [id number]

## 2010-10-03 NOTE — Cardiovascular Report (Signed)
NAME:  Brian Brock, ESCHBACH                       ACCOUNT NO.:  [id number]   MEDICAL RECORD NO.:  [id number]                   PATIENT TYPE:  INP   LOCATION:  6599                                 FACILITY:  [facility]   PHYSICIAN:  Richard A. Alanda Amass, M.D.          DATE OF BIRTH:  11/20/1945   DATE OF PROCEDURE:  07/04/2002  DATE OF DISCHARGE:                              CARDIAC CATHETERIZATION   PROCEDURES:  Retrograde central aortic catheterization, selective coronary  angiography by Judkins technique, pre and post intracoronary nitroglycerin  administration, left ventricular angiogram, RAO, LAO projections,  subselective left internal mammary artery, abdominal aortic angiogram, mid  stream PA projection, right iliac angiogram, hand injection, weight-adjusted  heparin, Plavix 300, continued Integrilin infusion, intravascular ultrasound  interrogation, high-grade proximal - mid stenosis, direct stenting, DES  Cypher stent, proximal - mid circumflex, intravascular ultrasound  interrogation left anterior descending.   BRIEF HISTORY:  The patient's history is well-outlined in his accompanying  records, and old charts.  He is well known to me long-term.  He is a 65-year-  old white married father of one child with 2 grandchildren.  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  He is a chronic smoker and  still smokes 1 to 1-1/2 packs of cigarettes per day for almost 40 years.  He  has advanced strong family history of coronary disease with his father dying  of an MI at 13, one brother with open heart surgery at 62.  The patient has  had multiple prior coronary procedures for symptomatic angina.   He had LAD stent with a PS 1530, April 06, 1996.  At that time, he had  mid RCA stent with a 3.0 x 15 ACS Multi-Link stent.   He had progression of disease, and on April 02, 1997, he had a tandem  stent to the LAD and a proximal 3.0 x 15 AVE and a proximal RCA stent  placed  (3.0/15 AVE).  He had an old total diagonal #2 occlusion with a small  vessel, EF 50-55% with a mild mid anterolateral wall motion abnormality.   He was restudied April 24, 1999, and found to have no re-stenosis of his  tandem LAD, proximal and mid RCA stents with 40-50% mid LAD narrowing and  40% proximal - mid circumflex narrowing.  He has been treated medically.  He  does have some iliac disease and a history of mild claudication.  Lipids  have been under control with medical therapy and he has not had recurrent  angina until the day prior to admission.  He has had two days of typical  substernal chest pain with rest and exertion, compatible with acute coronary  syndrome and was admitted on 07/03/2002.  Myocardial __________ of his PA  valve. Serial enzymes and EKGs, creatinine is 1.1.  CPK MB with troponins  were negative.   DESCRIPTION OF PROCEDURE:  The patient was taken  to cath room #7 in the  postabsorptive state after 5 mg of Valium p.o. premedication.  The right  groin was prepped, draped in the usual manner.  Xylocaine 1% was used for  local anesthesia and the RFA was entered with a single anterior puncture  using an 18 thin wall needle and a 6 [place] short Daig side-arm sheath was  inserted without difficulty. Diagnostic catheterization was done with a 6  French 4 cm taper preformed Cordis coronary and pigtail catheters using  Omnipaque dye throughout the procedure. LV angiogram was done at 25 mL 14  mL/sec RAO and 20 mL 12 mL/sec LAO projection.  Pullback pressure of the CA  showed no gradient across the aortic valve.  Subselective LIMA showed widely  patent LIMA.  No subclavian stenosis.  Abdominal angiogram was done in the  mid stream PA projection.  The injector malfunctioned and he got about 10 to  15 mL above the level of the renal arteries, but this demonstrated patency  single normal renal arteries bilaterally.  The infrarenal aorta showed mild   atherosclerotic disease with no obstruction or aneurysm formation. A second  injection was done above the iliac bifurcation showing 30% RCIA calcific  narrowing with good lumen and 40-50% calcific LCIA narrowing.  The  hypogastrics were intact bilaterally.  There was apparent high-grade 90%  stenosis of the right hypogastric with good antegrade flow.  The right  external iliac appeared normal.   PRESSURES:  1. LV:  130/0, LVEDP 16-18 mmHg.  CA 130/70 mmHg.  There is no gradient     across the aortic valve on catheter pullback.   Fluoroscopy showed the stents in the LAD and RCA and +2 calcification of the  LAD and RCA.   The LV angiogram demonstrated a small area of hypo - akinesis of the mid  anterolateral wall and posterior apical segment with EF approximately 55%  and no mitral regurgitation present.   The patient tolerated the diagnostic procedure well.   The main left coronary had no significant stenosis and was relatively short.   The LAD had segmental smooth narrowing of approximately 40% angiographically  from the ostia up to the first diagonal.  The first diagonal was moderately  large with good antegrade flow and no significant stenosis.   The second diagonal from the junction of the proximal and mid third of the  LAD was occluded and this was old and there was very faint antegrade filling  of a very small branch.   Two tandem stents were well positioned between the first and second diagonal  covering a large septal perforator which had good flow.  There was 30-40%  intimal proliferation and narrowing in the proximal third, 20% in the  midportion, no significant narrowing in the distal portion of the  overlapping stents with excellent residual lumen and smooth.  There was some  eccentric 40-50% narrowing of the LAD beyond the diagonal #2 that was  clearly focal in the bend and another 30-40% smooth narrowing in the mid LAD before the third diagonal.  The remainder of the  LAD was large, coursed the  apex of the heart where it bifurcated with no significant stenosis.   The circumflex gave off a very small thin OM-1 that had a 70% proximal  narrowing with good flow.   There was then segmental 50% narrowing and focal 75% eccentric narrowing of  the mid circumflex at the level of a very small OM-2 crossing the PAVG  branch which gave off an atrial branch superiorly and a PAVG branch  inferiorly with good flow.   The right coronary was a dominant vessel.  The proximal stent was well-  visualized with less than 30% narrowing. The mid stent was well-visualized  with 40% smooth narrowing.  The vessel was widely patent with good flow to a  large PDA and small PLA branch and a large bifurcating RV branch in the  midportion.   DISCUSSION:  It was difficult to tell what this patient's culprit lesion  was.  His history is very typical of acute coronary syndrome and unstable  angina.  It was felt best to proceed with IVUS interrogation in this setting  with angiographic suspicion that there was progression of disease at least  in the mid circumflex.  The right coronary appeared good angiographically  with no significant stenosis.   The patient was given weight-adjusted heparin monitoring ACTs throughout the  procedure, received a total of 4000 units.  ACTs were monitored.  The final  ACT after the procedure was done was 223 seconds, but ranged up to 280  seconds during IVUS interrogation and intervention.   The circumflex was intubated with a JL4 6 [place] Scimed guiding catheter.  The circumflex lesion was crossed with an Asahi 0.014 inch soft wire.  Lantus probe Scimed IVUS was then used for interrogation of the circumflex  artery across the mid lesion and PAVG branch.   This demonstrated a lesion diameter of 1.7 x 2.1 mm, less than 3 mm sq  across the CSA.  The proximal reference circ was 3.4 mm and the mid to  distal reference circ was 2.8 mm.  It was felt  that this was a lesion of  approximately 75% probably hemodynamically significant, especially from the  focal angiographic appearance, and it was elected to proceed with stenting.  Initially, we chose a Cypher 3.0/13 stent. It was difficult to cross because  of the bend and backup, so a stent was removed.  The buddy wire was then  crossed with a HTF 0.014 extra-support wire.  The side wire was then removed  and then we were able to pass the stent across the lesion.  Dilatation was  done at 11-33. The lesion was then again dilated at 12-35.  Injection showed  some question of pseudo dissection proximal to the stent which felt was  probably due to the extra-support guide wire and circ tortuosity.  The  patient was given 200 of IC nitroglycerin and repeat injections were taken  and then the guide wire was removed and another repeat injection was taken.  There appeared to be a slightly hypodense area just proximal to the stent. It was not clear whether this was a focal dissection of size mismatch.  There was good TIMI-3 flow throughout the vessel.  Integrilin was continued.  The patient was given 300 mg of Plavix during the procedure.   It was felt best to re-IVUS the circ and this was done crossing this with a  Asahi soft guide wire and the same Atlantis IVUS.  This demonstrated  excellent stent expansion in the circumflex with diameter of 3.0/3.2 mm and  a cross-sectional area of greater than 6.1 mm sq.  The stent expansion was  3.0 to 3.2 mm by IVUS interrogation.  Specifically, there was no dissection  visualized proximal to the stent and the side branch PAVG was intact.   We then approached the LAD.  The guiding catheter was changed.  The IVUS  system was removed from the circumflex to a JL 3.5. 6 [place] guiding  catheter.  The LAD was crossed with an Asahi soft 0.014 inch guide wire and  the same Atlantis catheter was used for IVUS interrogation of the LAD.   The mid LAD segmental  smooth area was 2.2 x 2.1 mm (CSA 3.1 mm sq).   The proximal - mid focal area of the LAD was 2.6 x 2.2 mm (CSA 4.5 mm sq).  The segmental proximal LAD was 3.4 x 2.9 mm (CSA 7.5 mm sq).  Based on this  IVUS interrogation, there was felt to be adequate lumen and there was no  significant change from the prior IVUS interrogation and a flow of 700 to  the LAD so no intervention was performed.   The dilatation system was removed, final ACT was 223 seconds.  The patient  did have some indigestion which was relieved with sitting up, which she felt  might be related to his Plavix, but no ischemic chest pain or ECG changes.  He tolerated the procedure well, transferred to the holding area for ACT  measurement and subsequent sheath removal.  We plan to continue Integrilin  for 18 hours, aspirin and Plavix, GI therapy and counseling for smoking  cessation.   If the patient has recurrent chest pain, he may require followup Cardiolite  for further assessment of his LAD, although his lesions did not appear  hemodynamically significant angiographically or by IVUS interrogation as  outlined above.   I can also not rule out associated upper GI symptoms in this patient.   CATHETERIZATION DIAGNOSES:  1. Arteriosclerotic heart disease - unstable angina without myocardial     infarction.  2. Successful culprit lesion primary stent with intravascular ultrasound     guidance pre and post mid circumflex for progression of disease.  3. Intravascular ultrasound interrogation to the left anterior descending.     No significant change since April 24, 1999, (see above), moderate     disease with no re-stenosis of tandem proximal left anterior descending     stents.  4. No re-stenosis proximal and mid right coronary artery stents (see above).  5. Well preserved left ventricular function __________ anterior wall motion     abnormality, 55% ejection fraction. 6. Peripheral vascular disease, stable, mild common  iliac disease, left     greater than right.  7. Normal renal arteries, mild hypertension.  8. Hyperlipidemia, on therapy.  9. Continued cigarette abuse, chronic obstructive pulmonary disease, chronic     bronchitis.  10.      Gastroesophageal reflux disease.                                                  Richard A. Alanda Amass, M.D.    RAW/MEDQ  D:  07/04/2002  T:  07/04/2002  Job:  [id number]   cc:   [facility]   Nanetta Batty, M.D.  [address], [address]  [place]  [place] [address]  Fax: [phone]   Lawana Pai, M.D.  [place]

## 2010-10-03 NOTE — Discharge Summary (Signed)
NAME:  Brian Brock, Brian Brock                       ACCOUNT NO.:  [id number]   MEDICAL RECORD NO.:  [id number]                   PATIENT TYPE:  INP   LOCATION:  2001                                 FACILITY:  [facility]   PHYSICIAN:  Darlin Priestly, M.D.             DATE OF BIRTH:  1945-08-22   DATE OF ADMISSION:  10/22/2002  DATE OF DISCHARGE:  10/23/2002                                 DISCHARGE SUMMARY   DISCHARGE DIAGNOSES:  1. Chest pain, negative myocardial infarction.  2. Hypertension, controlled.  3. Hyperlipidemia, controlled.  4. Chronic obstructive pulmonary disease.  5. Peripheral vascular disease.  6. History of coronary disease with previous stent.   CONDITION ON DISCHARGE:  Improved.   PROCEDURES:  October 23, 2002, Combined left heart catheterization by Dr.  Madaline Savage.   DISCHARGE MEDICATIONS:  1. Toprol XL 25 mg daily.  2. Plavix 75 mg daily.  3. Cozaar 50 mg daily.  4. Zocor 40 mg daily.  5. Zyrtec 10 mg daily.  6. Enteric coated aspirin 325 mg daily.  7. Pletal 50 mg b.i.d.  8. Ambien 5 mg every evening.  9. Protonix 40 mg daily.  10.      Nitroglycerin sublingual 1/150th under tongue as needed for chest     pain.   DISCHARGE INSTRUCTIONS:  1. If further chest pain please call the office at [phone].  2. No strenuous activity, no sexual activity, no lifting over 10 pounds for     3 days and then resume regular activities.  May return to work November 03, 2002.  3. Low fat, low salt diet.  4. Wash catheterization site with soap and water.  Call if any bleeding,     swelling or drainage.  5. On November 02, 2002 have your 2-dimensional echocardiogram done as     scheduled.  6. Follow up with Darcella Gasman. Annie Paras, N.P. on November 10, 2002 at 9:45 A.M.   HISTORY OF PRESENT ILLNESS:  The patient is a 65 year old male with history  of hypertension, hyperlipidemia, chronic obstructive pulmonary disease,  peripheral vascular disease and coronary disease including  stenting to the  left anterior descending and right coronary artery.  He was admitted in  February of 2004 with chest pain, had cardiac catheterization on the 17th of  February and received a Cypher stent to the left circumflex.  The patient  had done well until October 22, 2002 on the Friday prior to admission he  developed substernal chest pain radiating to the epigastric area that lasted  20 minutes, resolved after nitroglycerin.   The patient felt fairly well but then had right arm pain with shortness of  breath the morning of his admission. He had no nausea or diaphoresis.  He  took one nitroglycerin without relief.  By the time he got to the emergency  room the chest pain was resolved.  He was admitted, placed  on intravenous  heparin and nitroglycerin with plans for cardiac catheterization.   PAST MEDICAL HISTORY:  As stated.   ALLERGIES:  SULFA.   FAMILY HISTORY, SOCIAL HISTORY, REVIEW OF SYSTEMS:  Please see history and  physical.   MEDICATIONS PRIOR TO ADMISSION:  1. Toprol 25 mg daily.  2. Plavix 75 mg daily.  3. Cozaar 50.  4. Zocor 40.  5. Zyrtec 10.  6. Aspirin 325  7. Ambien 5.  8. Pletal 50 twice  a day.   PHYSICAL EXAMINATION:  VITAL SIGNS:  At discharge blood pressure was 115/60,  pulse 61, respirations 20, temperature 97.3.  GENERAL:  Alert, oriented male.  LUNGS:  Clear to auscultation bilaterally.  HEART:  Regular rate and rhythm.  Normal S1 and S2.  EXTREMITIES:  Without edema.  Catheterization site stable.   LABORATORY DATA:  Hemoglobin 13.8, hematocrit 40.7, white blood cell count  6.3, MCV 84.6, platelet count 220,000, neutrophils 58, lymphs 33, monos 7,  eosinophils 1, basos 1.   PTT 68 on heparin. ProTime 12.3, INR 0.9.  Sodium 137, potassium 4, chloride  105, cO2 25, glucose 99, BUN 17, creatinine 1.0. Calcium 9.5, total protein  6.6, albumin 3.8, AST 29, ALT 28, ALP 81, total bilirubin 0.7.   Cardiac enzymes, initial CK 219, MB 7.9, troponin 0.01,  second CK 175, MB  5.9, troponin less than 0.01, third CK 142, MB 5.0.   Cholesterol total 161, triglycerides 104, HDL 45, LDL 95.   Chest x-ray on admission stable chest. No active findings. Lungs clear.   Electrocardiogram on admission sinus bradycardia, rate 53.  No other  changes.  Normal electrocardiogram.   Cardiac catheterization done October 23, 2002 previous stents to right coronary  artery, circumflex and left anterior descending were patent.  Ejection  fraction was 55%, he had no MR.   HOSPITAL COURSE:  The patient was admitted by Dr. Jenne Campus on October 22, 2002  with unstable angina and was placed on intravenous heparin and nitroglycerin  and admitted to a telemetry bed.   Cardiac enzymes initially CK-MB were mildly elevated, troponin remained  negative throughout the hospitalization.  The patient was taken to the  cardiac catheterization laboratory on October 23, 2002 and stents were found to  be patent.  He did have 100% diagonal lesion that was stenosed and  chronically occluded.  He was given medical therapy, reassurance to patient  and was discharged home on that day post procedure.  He will follow up with  the nurse practitioner and Dr. Jenne Campus.     Darcella Gasman. Valarie Merino                     Darlin Priestly, M.D.    LRI/MEDQ  D:  11/20/2002  T:  11/20/2002  Job:  [id number]   cc:   Dr. Soyla Dryer [facility]    cc:   Dr. Soyla Dryer [facility]

## 2010-10-03 NOTE — Assessment & Plan Note (Signed)
[facility]                             PULMONARY OFFICE NOTE   Brian Brock, Brian Brock                    MRN:          [id number]  DATE:06/21/2006                            DOB:          January 17, 1946    PROBLEM:  1. Asthmatic bronchitis.  2. Esophageal reflux.   HISTORY:  Head and chest congestion come and go. He has not been using  the Advair. He says his typical bronchitis season is through fall until  spring. Advair had given a sore mouth. He does wake with a dry mouth and  agrees he probably snores some. We discussed this.   MEDICATIONS:  1. Vytorin 10/20.  2. Toprol.  3. Plavix 75 mg.  4. Cozaar 50 mg.  5. AcipHex 10 mg.  6. Zyrtec 10 mg.  7. Advair 150/50.  8. Spiriva.  9. P.r.n. use of Ambien.  10.Flomax.  11.Mucinex D.M.   DRUG INTOLERANCES:  SEPTRA.   OBJECTIVE:  Weight is 211 pounds.  Blood pressure is 108/60.  Pulse  regular at 52.  Room air saturation is 96%.  There is minimal nasal congestion and turbinate edema with no  significant mucus. His oropharynx is clear with no pharyngeal erythema  or post-nasal drip. No stridor. Voice quality is normal.  CHEST: Is quiet and clear.  Heart sounds normal.   PFTs: Minimal obstructive airways disease, small airway flows, where  there is insignificant response to bronchodilator. Normal measured lung  volumes and diffusion capacity.   IMPRESSION:  Mild bronchitis and rhinitis.   PLAN:  1. Try Breathe Right strips.  2. Try Asmanex sample one puff daily with backup prescription to use      instead of Advair. We      are looking at this is easier on his mouth and on whether it works      as well at lower cost.  3. Schedule return two months, but earlier p.r.n.     [name], MD, Tonny Bollman, FACP  Electronically Signed    CDY/MedQ  DD: 06/22/2006  DT: 06/22/2006  Job #: [id number]   cc:   Roney Marion, MD  Richard A. Alanda Amass, M.D.

## 2010-10-03 NOTE — Assessment & Plan Note (Signed)
[facility]                             PULMONARY OFFICE NOTE   COTE, MAYABB                    MRN:          [id number]  DATE:05/20/2006                            DOB:          1945/12/21    PROBLEM:  A 65 year old man referred through the courtesy of Dr. Lawana Pai  because of persistent bronchitis with chest congestion and dyspnea.   HISTORY:  Starting about 3 months ago he has had a sense of chest  congestion and gurgle, especially supine with cough which is non-  positional.  He is better on antibiotics.  He wakes during the night  coughing.  There is history of bronchitis routinely in October and  November of the last several years.  He is not bringing up much phlegm.  There is a history of allergic rhinitis and asthma.  He is currently  finishing a course of Levaquin and Advair being used over the past 2  weeks seems to help.  Dr. Lawana Pai sent notes describing 4 visits to that  office since October.  He had been treated previously with Avalox.   MEDICATIONS:  1. Vytorin 10/20.  2. Toprol 12.5 mg.  3. Plavix 75 mg.  4. Cozaar 50 mg.  5. AcipHex 10 mg.  6. Zyrtec 10 mg.  7. Advair 250/50.  8. Levaquin for 2 more days.  9. Ambien p.r.n.  10.Flomax.  11.Mucinex DM.   He is allergic to SULFA.   REVIEW OF SYSTEMS:  Some exertional shortness of breath.  Cough is  productive of scant amounts of white or yellow sputum is noted with no  blood.  No chest pain.  No definite fever or chills.  Nasal congestion,  occasionally difficulty breathing through the nose and sneezing,  especially in the spring and fall.  AcipHex prevents heartburn, which he  would otherwise notice.  He does not recognize that he is refluxing.  There has been no rash.  No peripheral edema.   PAST HISTORY:  1. Pneumonia.  2. Myocardial infarction.  3. Asthma.  4. Elevated cholesterol.  5. Allergic rhinitis.  6. Angioplasty with stent x3.  7. He converted to  tuberculosis skin test and took INH in the past.  8. No intolerance to latex, contrast dye or aspirin.   SOCIAL HISTORY:  Smoking 1 pack per day until 1 week ago when he says he  quit.  He does drink alcohol.  Married with children.  He is retired  from Patent examiner.  Wife is a patient of ours.  He sees Dr. Alanda Amass  for his cardiac care.   FAMILY HISTORY:  Father had myocardial infarction.  Mother had  bronchitis, died with Alzheimer's.   OBJECTIVE:  Weight 210 pounds.  BP 108/64.  Pulse regular 65.  Room air  saturation 96%.  Well-developed, well-nourished man.  Nasal airway unobstructed.  Voice quality is normal.  Pharynx is red.  He has a congested-sounding cough with end expiratory wheeze  bilaterally.  No dullness or increased work at breathing.  I do not find adenopathy or neck vein distention.  There is no stridor.  HEART:  Sounds are regular without murmur or gallop.  There is no enlargement of liver or spleen.  EXTREMITIES:  Without tremor, cyanosis, clubbing or edema.  CHEST X-RAY:  Films brought from his primary office show some prominent  markings, but no obvious acute process.   IMPRESSION:  Cough consistent with a chronic asthmatic bronchitis,  likely related to his prolonged smoking use, allergy and reflux.   PLAN:  1. Reflux precautions and elevate head of bed.  2. Continue efforts at smoking cessation.  He has been able to stop      for a week, and we discussed supportive measures available.  3. Trial of Advair 250/50 one puff b.i.d.  4. Spiriva 1 daily.  5. Schedule pulmonary function test.  6. Finish the Levaquin.  7. Schedule return in 1 month, earlier p.r.n.   I appreciate the chance to meet him.     [name], MD, Tonny Bollman, FACP  Electronically Signed    CDY/MedQ  DD: 05/22/2006  DT: 05/22/2006  Job #: [id number]   cc:   Roney Marion, MD  Richard A. Alanda Amass, M.D.

## 2010-10-03 NOTE — Discharge Summary (Signed)
NAME:  Brian Brock, Brian Brock                       ACCOUNT NO.:  [id number]   MEDICAL RECORD NO.:  [id number]                   PATIENT TYPE:  INP   LOCATION:  6522                                 FACILITY:  [facility]   PHYSICIAN:  Richard A. Alanda Amass, M.D.          DATE OF BIRTH:  [birth year]   DATE OF ADMISSION:  07/03/2002  DATE OF DISCHARGE:  07/05/2002                                 DISCHARGE SUMMARY   ADMISSION DIAGNOSES:  1. Unstable angina.  2. Coronary artery disease.     a. History of left anterior descending tandem stenting in 1997 and 1998.     b. History of right coronary artery stenting in 1997 and 1998.     c. Known occluded second diagonal.     d. Last catheterization on April 24, 1999.     e. Last Cardiolite on November 10, 2001, with mild anterolateral ischemia.  3. Normal left ventricular function.  4. Hypertension.  5. Hyperlipidemia.  6. Chronic obstructive pulmonary disease.  7. Peripheral vascular disease with decreased ankle brachial indexes of 0.88     bilaterally.  8. Tobacco use.   DISCHARGE DIAGNOSES:  1. Unstable angina.  2. Coronary artery disease.     a. History of left anterior descending tandem stenting in 1997 and 1998.     b. History of right coronary artery stenting in 1997 and 1998.     c. Known occluded second diagonal.     d. Last catheterization on April 24, 1999.     e. Last Cardiolite on November 10, 2001, with mild anterolateral ischemia.  3. Normal left ventricular function.  4. Hypertension.  5. Hyperlipidemia.  6. Chronic obstructive pulmonary disease.  7. Peripheral vascular disease with decreased ankle brachial indexes of 0.88     bilaterally.  8. Tobacco use.  9. Status post cardiac catheterization on July 04, 2002, by Richard A.     Alanda Amass, M.D.  He did perform IVUS-guided PTCA and stent to the left     circumflex using a Cypher stent.   HISTORY OF PRESENT ILLNESS:  The patient is a 65 year old, white, married  male who  presented to the emergency room on July 03, 2002, with a  complaint of chest pain.  His chest pain began about 11:30 p.m. and lasted  about 20 minutes.  There was radiation to bilateral arms, as well as the  left lateral breast/axilla region.  As well, he did develop some shortness  of breath.  He walked in the hallways and it seemed to ease off some on its  own.  He had been lying, watching TV, and brushing his teeth when the pain  had originally started.  He then had recurrent pain at about 8:30 a.m.,  which then eased off and he went to the dentist as planned.  He then had  recurrent pain multiple times and finally took some nitroglycerin and had  relief.  He continued to  have intermittent pain and came to the emergency  room.   At that time, EKG showed no significant abnormalities.  He was stable with  blood pressure 124/70 and heart rate 57.   At that time, he was seen and evaluated by Richard A. Alanda Amass, M.D.  It  was felt that his symptoms were worrisome for angina.  He planned for repeat  cardiac catheterization.  The risks and benefits of the procedure were  discussed and the patient was willing to proceed.   HOSPITAL COURSE:  On July 04, 2002, the patient underwent cardiac  catheterization by Richard A. Alanda Amass, M.D.  Please see the dictated  report as it is lengthy.  He was found to have no restenosis in the  previously placed stents.  He did succeed with IVUS-guided intervention to  the circumflex.  He used a Cypher stent.  He also performed IVUS to the LAD.  This was stable and no intervention required.  The patient tolerated the  procedure well with no complications.   On July 05, 2002, the patient is without any further chest pain.  He is  afebrile at 97.1 degrees and pulse 68.  The blood pressure has been between  108-140 systolic over 50.  Labs were stable post procedure.  He is  maintaining sinus rhythm.  His groin site is well healed without hematoma  or  bleed.  At this time, he was seen and evaluated by Nanetta Batty, M.D., who  deemed him stable for discharge home.   HOSPITAL CONSULTS:  None.   HOSPITAL PROCEDURES:  1. Cardiac catheterization on July 04, 2002, by Richard A. Alanda Amass,     M.D.  Please see his dictated report for details as it is not available     to me at this time.  He was found to have no restenosis of the LAD     proximal stent x 2.  There was 40% segmental proximal LAD disease which     was unchanged.  There was an old totally occluded second diagonal.  There     was no restenosis in the proximal RCA stenosis.  No restenosis of the mid     RCA stent.  There was some borderline segmental and focal mid circumflex     and mid LAD lesions.  Dr. Alanda Amass planned to proceed with IVUS     interrogation.   He performed IVUS to the circumflex lesion.  He then proceeded with  intervention and stenting using Cypher stent there.  He then proceed with  IVUS of the LAD.  This was felt to require no intervention.  The patient  tolerated the procedure well and had no complications.  He did use  Integrilin and planned to continue this for 18 hours post procedure.   EKGs had shown normal sinus rhythm and nonspecific ST-T change.  He  maintained sinus rhythm throughout the hospitalization with no arrhythmia.   LABORATORY DATA:  TSH normal at 1.528.  The lipid profile showed a total  cholesterol of 147, triglycerides 231, HDL 39, and LDL 62.  Cardiac enzymes  showed CKs of 164, 143, and 130, CK-MBs of 4.6, 3.5, and 2.9, and troponins  of 0.02, 0.01, and 0.01.  Liver function tests were all normal.  Sodium 139,  potassium 3.9, glucose 94, BUN 16, creatinine 1.0.  These remained stable  post procedure.  White count 8000, hemoglobin 14.6, hematocrit 42.6,  platelets 269.  These also remained stable.  PT 12.5, INR 0.9, PTT  29. Thereafter, the PTT was elevated with IV heparin.   DISCHARGE MEDICATIONS:  1. Plavix 75 mg once  a day for three months.  2. Enteric-coated aspirin 325 mg once a day.  3. Pletal 100 mg twice a day.  4. Zyrtec 10 mg as needed.  5. Toprol XL 50 mg a day.  6. Cozaar 50 mg a day.  7. Zocor 40 mg each night.  8. Vitamins E, C, and folic acid as before.  9. Wellbutrin 150 mg once a day for five days and then twice a day.  10.      Nitroglycerin 0.4 mg sublingual as directed.   ACTIVITY:  No strenuous activity, lifting greater than 5 pounds, driving, or  sexual activity for three days.  He is also given a note to stay out of work  until his follow-up visit on July 12, 2002.  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   DIET:  Low-salt, low-fat, low-cholesterol diet.   WOUND CARE:  May wash the groin site with warm water and soap.  Call [phone] for any bleeding or increased size or pain of the groin site.   SPECIAL INSTRUCTIONS:  We have discussed smoking cessation at length.  As  well, we have discussed the need for dietary modification at length.   FOLLOW-UP:  He has an appointment to see Abelino Derrick, P.A., on July 12, 2002, at 2 p.m.  At that time, he will need to address return to work  issues.  As well, he can follow up on his smoking cessation at that time.     Mary B. Easley, P.A.-C.                   Richard A. Alanda Amass, M.D.    MBE/MEDQ  D:  07/05/2002  T:  07/05/2002  Job:  [id number]

## 2010-10-03 NOTE — Assessment & Plan Note (Signed)
[facility]                             PULMONARY OFFICE NOTE   Brian Brock, Brian Brock                    MRN:          [id number]  DATE:08/16/2006                            DOB:          [birth year]    PROBLEM:  1. Asthmatic bronchitis.  2. Esophageal reflux.   HISTORY:  Asmanex is being taking at one puff daily, but he is concerned  about the cost.  Persistent congested cough.  Doxycycline may have  helped in the past.  He describes thick yellow sputum, variable amounts  of esophageal dysphagia, nothing really acute, no blood, no chest pain  or fever.   MEDICATIONS:  1. Vytorin 10/20.  2. Toprol 12.5 mg.  3. Plavix 75 mg.  4. Cozaar 50 mg.  5. AcipHex 10 mg.  6. Zyrtec 10 mg.  7. Asmanex one puff daily.  8. He uses Ambien, Flonase and Mucinex DM p.r.n.   He is off of Advair and Spiriva.   DRUG INTOLERANCES:  SULFA.   OBJECTIVE:  Weight 212 pounds.  BP 104/64, pulse regular at 56, room air  saturation 97%.  Somewhat congested cough, unlabored, no dullness.  Heart sounds regular without murmur.  No adenopathy.  Pharynx is not red.   IMPRESSION:  Chronic bronchitis.   PLAN:  Biaxin 500 mg b.i.d. for 10 days.  Try increasing Asmanex while  he still has it to use one puff b.i.d. and we will reassess for longer-  term when we see how he responds to these therapies.  Consider  prednisone taper later.  Schedule return in 2 months, earlier p.r.n.     [name], MD, Brian Brock, FACP  Electronically Signed    CDY/MedQ  DD: 08/22/2006  DT: 08/23/2006  Job #: [id number]   cc:   Roney Marion MD  Richard A. Alanda Amass, M.D.

## 2010-10-03 NOTE — Discharge Summary (Signed)
NAMERICHARDS, PHERIGO NO.:  [id number]   MEDICAL RECORD NO.:  [id number]          PATIENT TYPE:  OBV   LOCATION:  2037                         FACILITY:  [facility]   PHYSICIAN:  Nanetta Batty, M.D.   DATE OF BIRTH:  [birth year]   DATE OF ADMISSION:  01/11/2007  DATE OF DISCHARGE:  01/12/2007                               DISCHARGE SUMMARY   Mr. Roessner is a 65 year old male patient of Dr. Susa Griffins who  came into the hospital for PV angiogram.  He had a history of past  peripheral vascular disease with a Smart stent placed to his mid left  SFA in July 2006.  He, at that time, had a 50-70% stenosis in his mid  right SFA.  He also has coronary artery disease with a multiple PCIs in  the past, last cath June 2004 at which time he had a patent LAD stent,  circumflex stent, and a patent proximal and mid-RCA.  He apparently was  having increased claudication, and his Dopplers had worsened, thus he  came in for a PV angiogram.  This revealed he had a progression of his  mid right SFA stenosis 70-80%.  His left SFA and was patent.  Dr. Allyson Sabal  then performed a PTA and stenting using a Smart stent a 7 x 4 to his  right SFA.  The result was reduced from 70 to 0% .  The following day he  was stable.  His pressure was 118/61, pulse of 53, respirations 20,  temperature 98.5.  He had walked in the hall.  He had no complaints of  discomfort.  He was seen by Dr. Allyson Sabal and discharged home.   LABS:  Hemoglobin 12.9, hematocrit 37.8, WBC 9.1, platelets 229.  Sodium  141, potassium 3.7, BUN 17, creatinine 1.05.  His total cholesterol was  127, triglycerides 354, HDL was 22 and LDL was 34.   DISCHARGE MEDICATIONS:  1. Aspirin 81 mg a day.  2. Toprol XL 25 mg one-half every day.  3. Plavix 75 mg a day.  4. Cozaar 50 mg a day.  5. Zyrtec 10 mg a day.  6. Ambien 10 mg a day.  7. Aciphex 20 mg a day.  8. His Zocor was reduced to 40 mg a day.  9. He was told to start Tricor 145  mg a day.  10.He will also continue his fish oil capsules two per day.   DISCHARGE DIAGNOSES:  1. Claudication and worsened lower extremity Dopplers.  2. Status post PV angiogram revealing progression of his right SFA      disease with subsequent Smart stenting performed by Dr. Allyson Sabal.  3. Known coronary disease, stable with no recent angina.  4. Dyslipidemia, medication suggested, he has high triglycerides and      low HDL.  5. History of hypertension.  6. History of tobacco abuse.      Lezlie Octave, N.P.      Nanetta Batty, M.D.  Electronically Signed    BB/MEDQ  D:  02/11/2007  T:  02/11/2007  Job:  [id number]   cc:   Quita Skye.  Artis Flock, M.D.  [name], MD, FCCP, FACP

## 2010-10-03 NOTE — Op Note (Signed)
NAMEWILLEM, Brock             ACCOUNT NO.:  [id number]   MEDICAL RECORD NO.:  [id number]          PATIENT TYPE:  OIB   LOCATION:  2899                         FACILITY:  [facility]   PHYSICIAN:  Richard A. Alanda Amass, M.D.DATE OF BIRTH:  [birth year]   DATE OF PROCEDURE:  12/08/2004  DATE OF DISCHARGE:                                 OPERATIVE REPORT   PROCEDURE:  Retrograde abdominal aortic catheterization, abdominal aortic  angiogram, midstream PA projection, bilateral iliac angiography PA and  oblique projections, bilateral lower extremity runoff using DSA and step  table imaging, left SFA angiogram, left SFA PTA and self-expanding nitinol  smart 7 x 4 stent with dilatation with 6 x 4 Cordis Powerflex balloon,  weight adjusted heparin, continued aspirin and Plavix.   ESTIMATED BLOOD LOSS:  Approximately 15 mL.   ACCESS:  R CFA percutaneous uncomplicated - contralateral crossover 6 -  French Terumo sheath.   PROCEDURE:  Patient brought to the 6th floor PV lab in a post absorptive  state after 5 milligrams Valium p.o. premedication.  He was given 2  milligrams of Versed for sedation in the lab. 1% Xylocaine was used for  local anesthesia and RCFA was entered with single anterior puncture using 18  thin-wall needle and a 6-French short side-arm sheath was inserted without  difficulty. A Wholey wire was used to traverse the iliac system and for  guidewire exchange. A 5-French pigtail catheter was placed above the level  of the renal arteries and abdominal aortic angiogram was done in the  midstream PA projection. The second injection was done above the iliac  bifurcation both with DSA. Bilateral lower extremity runoff was done at 85  mL 8 cc per second through the pigtail catheter using step table imaging and  DSA. The catheter was removed right iliac angiography was done in the  oblique projection by hand with DSA. A 5-French crossover catheter was then  used to access the left  common iliac system. Left external left iliac  angiography was done in the oblique projections by hand. The patient  tolerated the diagnostic procedure well.   Arterial pressures were monitored throughout the procedure and ranged 120 to  140 mmHg and the patient was in sinus rhythm. It is noteworthy that he had  significant snoring and mild sleep apnea with sedation during the procedure  and he has a history of this along with continued cigarette abuse.   The abdominal angiogram: The celiac and SMA were widely patent. The renal  arteries were single with no significant stenosis bilaterally. The IMA was  intact. There was mild to moderate diffuse infrarenal atherosclerotic  disease with no aneurysm or severe stenosis.   The iliacs were relatively small bilaterally. There was irregularity of both  common iliacs and 20-30% narrowing bilaterally. The hypogastrics were intact  bilaterally.   The external iliacs had mild irregularity were otherwise with 30% narrowing  on the left and no other significant narrowing with good flow.   The SFA profunda junctions were intact bilaterally.   The right SFA had mild irregularities proximally and the area of the mid SFA  mildly segmental to the proximal portion of Hunter's canal there was 50-70%  irregularity and narrowing. There is good flow beyond this and the popliteal  was widely patent and smooth. The anterior tibial came off below the knee  joint but rather high but was patent and was three-vessel runoff to the  right lower extremity.   Left SFA had irregularities without significant stenosis proximally. There  was mildly segmental approximately 3 cm area of 80-85% complex  atherosclerotic stenosis of the mid SFA just before Hunter's canal. Beyond  this there was mild irregularities. The popliteal had no significant  stenosis. The L A T arose rather high superiorly near the joint space. There  is three-vessel runoff to the left foot.   It  was elected to proceed with PPI in this setting with symptomatic  claudication, positive Dopplers from November 05, 2004, showing 360 cm/sec  stenosis in the left mid SFA corresponding to his high-grade lesion. A right  SFA had 234 cm/sec velocities in the midportion. RABI 0.995 LADI 0.94.   The patient was given 3500 units of heparin, kept on aspirin and Plavix.  ACT was monitored. The left common iliac had been accessed with a crossover  catheter.  A Wholey wire was used to traverse the iliac system and a 6-  [place] Terumo sheath was passed over the wire. The dilator was removed and  because the narrow angle iliac bifurcation, we then used a crossover  catheter within the sheath to help move the sheath around the iliac  bifurcation. It was positioned in the L E I A and stable with the catheter  removed. Spot injections of the left SFA and oblique projection was done  with DSA. The lesion was crossed with 0.035 inches Wholey wire. The was  dilated with a 5 mm x 2 cm Cordis Powerflex balloon at 8-30. Scout injection  was obtained and the lesion was then stented with a 7 mm x 4 cm Cordis Smart  stent which was positioned fluoroscopically and deployed under fluoroscopic  control covering the lesion. The stent was then postdilated with an upgraded  6 mm x 4 cm Cordis Powerflex balloon at 9-40 and repeated at 10-40. The  balloon was removed and final injection shows excellent angiographic results  of 80-85% reduced to 0% with no dissection and good flow. The sheath was  pulled back to the right external iliac area under fluoroscopic control.  Guidewire removed, sheath flushed. The patient was transferred to the  holding area for ACT measurement, sheath removal and pressure hemostasis. He  tolerated the procedure well.   The patient has had successful left SFA PTA and nitinol self-expanding stent  4 x 4 claudication and abnormal Dopplers. He also has right lower extremity claudication not as  severe as the left and moderately increased velocities  in the right SFA. I would recommend continued medical therapy and  observation of this.  He is a candidate for right SFA intervention is  necessary. Strong counseling again on discontinuation of smoking which he is  trying to do and continued medical therapy of his associated problems.  Fortunately from a cardiac standpoint, he has been stable and without angina  and with a borderline Cardiolite December 2005. Would have a low threshold  for recatheterization should he have any recurrent angina in this setting.   CATHETERIZATION DIAGNOSIS:  1.  ASPVD - bilateral claudication.  2.  Successful left SFA PTA and stent.  3.  Moderately severe right SFA stenosis  with elevated velocities on duplex      scanning three-vessel runoff bilaterally.  4.  Systemic hypertension under control, normal renal arteries.  5.  Coronary artery disease. (A) LAD PS 1530 stent 11/97 plus mid RCA ACS      stent. (B) tandem LAD and proximal RCA stainless steel balloon      expandable stents 11/98 (C) Cypher stent with IVUS interrogation      circumflex 02/04 (D) no restenosis on recatheterization 06/04 with EF 50-      55%, mild lateral hypokinesis, medical therapy recommended.  6.  Continued cigarette abuse.  7.  Hyperlipidemia.  8.  GERD.  9.  Probable sleep apnea.   Fardeen is retired from [employer] in 2004.  He does some  part time work now.  His wife still works for the police department. He is a  smoker, has PAD, CAD as outlined above with associated hyperlipidemia and  continued cigarette abuse. Probable sleep apnea. He has one child and two  grandchildren.       RAW/MEDQ  D:  12/08/2004  T:  12/08/2004  Job:  [id number]   cc:   CP lab   Dr. Roney Marion, [place]

## 2010-10-03 NOTE — Cardiovascular Report (Signed)
NAME:  Brian Brock, Brian Brock                       ACCOUNT NO.:  [id number]   MEDICAL RECORD NO.:  [id number]                   PATIENT TYPE:  INP   LOCATION:  2001                                 FACILITY:  [facility]   PHYSICIAN:  Madaline Savage, M.D.             DATE OF BIRTH:  Apr 06, 1946   DATE OF PROCEDURE:  10/23/2002  DATE OF DISCHARGE:                              CARDIAC CATHETERIZATION   PROCEDURES PERFORMED:  1. Selective coronary angiography by Judkins technique.  2. Retrograde left heart catheterization.  3. Left ventricular angiography.   ENTRY SITE:  Right femoral.   DYE USED:  Omnipaque.   COMPLICATIONS:  None.   PATIENT PROFILE:  The patient is a 65 year old white married male patient of  Brian Brock, M.D. who last had percutaneous intervention of his  left circumflex on July 04, 2002 by Brian Brock, M.D.  A Cypher  3 x 13 mm stent was placed in the proximal portion of the vessel.   The patient presented to the hospital on October 22, 2002 with shortness of  breath and some atypical chest pain.  His EKGs have shown to be normal.  His  troponin was 0.01 and his CK was 219 with 7.9 and a positive relative index.  The patient entered the catheterization laboratory today with stable vital  signs.  His laboratory work showed the following results:  His hemoglobin  was 13.1, hematocrit 39%, platelet count 218,000, white blood cell count  7100.  Electrolytes were normal.  Blood urea nitrogen 17, creatinine 1.0,  and liver function studies were normal.   RESULTS:  PRESSURES: The left ventricular pressure was 120/4, end-diastolic  pressure 8.  Central aortic pressure was 120/65, mean of 85.  No aortic  valve gradient by pullback technique.   ANGIOGRAPHIC RESULTS:  1. The left main coronary artery was fairly short and normal in appearance.  2. The left anterior descending coronary artery was patent throughout its     course with no lesions.  There was  noted to be a radial opaque stent in     the proximal portion of the LAD just after a tiny optional diagonal     branch and just before the septal perforator branch.  There was an     occluded second diagonal branch that was long and hair like that received     collateral flow from the first diagonal.  3. The left circumflex coronary artery was nondominant.  It gave rise to one     bifurcating obtuse marginal branch and a tiny hair like intermediate     ramus branch off of the left main.  There was a patent radial opaque     stent in the circumflex just before the origin of the first obtuse     marginal branch.  This appeared widely patent.  The distal circumflex was     normal.  4. The right coronary  artery was large and dominant giving rise to both     posterolateral and posterior descending branches.  There were two radial     opaque stents in the RCA, one was proximal just beyond the origin which     was patent and the second stent was mid portion of the vessel just before     the acute angle of the heart with patent distal right coronary artery,     posterior descending artery, and posterolateral branches.   LEFT VENTRICULAR ANGIOGRAPHY:  Left ventricular angiography in an RAO view  showed normal ejection fraction estimate of 55%.  There was anterolateral  wall hypokinesis of mild degree.  No mitral regurgitation was seen.   FINAL DIAGNOSES:  1. Widely patent stents to left anterior descending, proximal circumflex,     proximal right and mid right.  2. Chronically totally occluded second diagonal branch of the left anterior     descending.  3. Mild hypokinesis anterolateral wall of left ventricle with ejection     fraction 55%.   PLAN:  Medical therapy and reassurance to patient.  If more chest pain  occurs a Cardiolite may help delineate if there is an area that needs to be  reevaluated.                                               Madaline Savage, M.D.    WHG/MEDQ  D:   10/23/2002  T:  10/23/2002  Job:  [id number]   cc:   Gerlene Burdock A. Alanda Brock, M.D.  [phone] [address]  [place]  [place] [address]  Fax: [phone]   Cath Lab

## 2011-02-27 LAB — BASIC METABOLIC PANEL
BUN: 17
BUN: 19
CO2: 27
CO2: 28
Calcium: 9
Calcium: 9.4
Chloride: 105
Chloride: 105
Creatinine, Ser: 1
Creatinine, Ser: 1.05
GFR calc Af Amer: >60
GFR calc Af Amer: >60
GFR calc non Af Amer: >60
GFR calc non Af Amer: >60
Glucose, Bld: 107 — ABNORMAL HIGH
Glucose, Bld: 97
Potassium: 3.7
Potassium: 4
Sodium: 139
Sodium: 141

## 2011-02-27 LAB — LIPID PANEL
Cholesterol: 127
HDL: 22 — ABNORMAL LOW
LDL Cholesterol: 34
Total CHOL/HDL Ratio: 5.8
Triglycerides: 354 — ABNORMAL HIGH
VLDL: 71 — ABNORMAL HIGH

## 2011-02-27 LAB — CBC
HCT: 37.8 — ABNORMAL LOW
Hemoglobin: 12.9 — ABNORMAL LOW
MCHC: 34.2
MCV: 84.8
Platelets: 229
RBC: 4.46
RDW: 13.4
WBC: 9.1

## 2011-05-21 DIAGNOSIS — J441 Chronic obstructive pulmonary disease with (acute) exacerbation: Secondary | ICD-10-CM | POA: Diagnosis not present

## 2011-07-14 DIAGNOSIS — J988 Other specified respiratory disorders: Secondary | ICD-10-CM | POA: Diagnosis not present

## 2011-07-14 DIAGNOSIS — R0989 Other specified symptoms and signs involving the circulatory and respiratory systems: Secondary | ICD-10-CM | POA: Diagnosis not present

## 2011-09-01 DIAGNOSIS — I739 Peripheral vascular disease, unspecified: Secondary | ICD-10-CM | POA: Diagnosis not present

## 2011-09-01 DIAGNOSIS — E782 Mixed hyperlipidemia: Secondary | ICD-10-CM | POA: Diagnosis not present

## 2011-09-01 DIAGNOSIS — I2510 Atherosclerotic heart disease of native coronary artery without angina pectoris: Secondary | ICD-10-CM | POA: Diagnosis not present

## 2011-09-02 DIAGNOSIS — Z79899 Other long term (current) drug therapy: Secondary | ICD-10-CM | POA: Diagnosis not present

## 2011-09-02 DIAGNOSIS — Z125 Encounter for screening for malignant neoplasm of prostate: Secondary | ICD-10-CM | POA: Diagnosis not present

## 2011-10-19 DIAGNOSIS — R300 Dysuria: Secondary | ICD-10-CM | POA: Diagnosis not present

## 2011-10-19 DIAGNOSIS — B3749 Other urogenital candidiasis: Secondary | ICD-10-CM | POA: Diagnosis not present

## 2012-02-19 DIAGNOSIS — J441 Chronic obstructive pulmonary disease with (acute) exacerbation: Secondary | ICD-10-CM | POA: Diagnosis not present

## 2012-03-25 DIAGNOSIS — I739 Peripheral vascular disease, unspecified: Secondary | ICD-10-CM | POA: Diagnosis not present

## 2012-03-25 DIAGNOSIS — I70219 Atherosclerosis of native arteries of extremities with intermittent claudication, unspecified extremity: Secondary | ICD-10-CM | POA: Diagnosis not present

## 2012-03-25 HISTORY — PX: OTHER SURGICAL HISTORY: SHX169

## 2012-03-29 DIAGNOSIS — I2510 Atherosclerotic heart disease of native coronary artery without angina pectoris: Secondary | ICD-10-CM | POA: Diagnosis not present

## 2012-03-29 DIAGNOSIS — I739 Peripheral vascular disease, unspecified: Secondary | ICD-10-CM | POA: Diagnosis not present

## 2012-04-05 DIAGNOSIS — Z79899 Other long term (current) drug therapy: Secondary | ICD-10-CM | POA: Diagnosis not present

## 2012-04-29 ENCOUNTER — Other Ambulatory Visit (HOSPITAL_COMMUNITY): Payer: Self-pay | Admitting: Cardiovascular Disease

## 2012-04-29 DIAGNOSIS — I739 Peripheral vascular disease, unspecified: Secondary | ICD-10-CM

## 2012-04-29 DIAGNOSIS — I2510 Atherosclerotic heart disease of native coronary artery without angina pectoris: Secondary | ICD-10-CM

## 2012-05-06 ENCOUNTER — Ambulatory Visit (HOSPITAL_COMMUNITY)
Admission: RE | Admit: 2012-05-06 | Discharge: 2012-05-06 | Disposition: A | Discharge status: Home / Self Care | Payer: Medicare Other | Source: Ambulatory Visit | Attending: Cardiovascular Disease | Admitting: Cardiovascular Disease

## 2012-05-06 DIAGNOSIS — F17200 Nicotine dependence, unspecified, uncomplicated: Secondary | ICD-10-CM | POA: Diagnosis not present

## 2012-05-06 DIAGNOSIS — I10 Essential (primary) hypertension: Secondary | ICD-10-CM | POA: Diagnosis not present

## 2012-05-06 DIAGNOSIS — E785 Hyperlipidemia, unspecified: Secondary | ICD-10-CM | POA: Diagnosis not present

## 2012-05-06 DIAGNOSIS — E669 Obesity, unspecified: Secondary | ICD-10-CM | POA: Insufficient documentation

## 2012-05-06 DIAGNOSIS — Z8249 Family history of ischemic heart disease and other diseases of the circulatory system: Secondary | ICD-10-CM | POA: Insufficient documentation

## 2012-05-06 DIAGNOSIS — I739 Peripheral vascular disease, unspecified: Secondary | ICD-10-CM | POA: Insufficient documentation

## 2012-05-06 DIAGNOSIS — I2510 Atherosclerotic heart disease of native coronary artery without angina pectoris: Secondary | ICD-10-CM | POA: Insufficient documentation

## 2012-05-06 HISTORY — PX: NM MYOVIEW LTD: HXRAD82

## 2012-05-06 MED ORDER — REGADENOSON 0.4 MG/5ML IV SOLN
0.4000 mg | Freq: Once | INTRAVENOUS | Status: AC
  Administered 2012-05-06: 0.4 mg via INTRAVENOUS

## 2012-05-06 MED ORDER — TECHNETIUM TC 99M SESTAMIBI GENERIC - CARDIOLITE
11.0000 | Freq: Once | INTRAVENOUS | Status: AC | PRN
  Administered 2012-05-06: 11 via INTRAVENOUS

## 2012-05-06 MED ORDER — TECHNETIUM TC 99M SESTAMIBI GENERIC - CARDIOLITE
30.4000 | Freq: Once | INTRAVENOUS | Status: AC | PRN
  Administered 2012-05-06: 30 via INTRAVENOUS

## 2012-05-06 NOTE — Procedures (Addendum)
[facility] [address] [place] [address] [place] [address] [phone]  Cardiology Nuclear Med Study  Brian Brock is a 66 y.o. male     MRN : [id number]     DOB: 07-24-1945  Procedure Date: 05/06/2012  Nuclear Med Background Indication for Stress Test:  Evaluation for Ischemia,PTCA/Stent Patency, Abnormal EKG (NS-ST changes) History:  '97 Stents LAD,RCA, '98 Stent LAD, '06 (L) SFA Stent, '08 (R) SFA, 01-11-07 Last cath Cardiac Risk Factors: Family History - CAD, Hypertension, Lipids, Obesity and Smoker  Symptoms:  Chest Pain and PVD   Nuclear Pre-Procedure Caffeine/Decaff Intake:  8:00pm NPO After: 6:00am   IV Site: R Antecubital x 1, tolerated well IV 0.9% NS with Angio Cath:  22g  Chest Size (in):  44 IV Started by: Irean Hong, RN  Height: 5\' 9"  (1.753 m)  Cup Size: n/a  BMI:  Body mass index is 28.35 kg/(m^2). Weight:  192 lb (87.091 kg)   Tech Comments:  Took Bystolic this am    Nuclear Med Study 1 or 2 day study: 1 day  Stress Test Type:  Pension scheme manager  Order Authorizing Provider:  Susa Griffins, MD   Resting Radionuclide: Technetium 27m Sestamibi  Resting Radionuclide Dose: 11.0 mCi   Stress Radionuclide:  Technetium 10m Sestamibi  Stress Radionuclide Dose: 30.3 mCi           Stress Protocol Rest HR: 49 Stress HR: 73  Rest BP: 137/77 Stress BP: 137/72  Exercise Time (min): n/a METS: n/a   Predicted Max HR: 154 bpm % Max HR: 47.4 bpm Rate Pressure Product: 21308   Dose of Adenosine (mg):  n/a Dose of Lexiscan: 0.4 mg  Dose of Atropine (mg): n/a Dose of Dobutamine: n/a mcg/kg/min (at max HR)  Stress Test Technologist: Esperanza Sheets, CCT Nuclear Technologist: Gonzella Lex, CNMT   Rest Procedure:  Myocardial perfusion imaging was performed at rest 45 minutes following the intravenous administration of Technetium 70m Sestamibi. Stress Procedure:  The patient received IV Lexiscan 0.4 mg over 15-seconds.   Technetium 19m Sestamibi injected at 30-seconds.  There were no significant changes with Lexiscan.  Quantitative spect images were obtained after a 45 minute delay.  Transient Ischemic Dilatation (Normal <1.22):  1.13 Lung/Heart Ratio (Normal <0.45):  0.41 QGS EDV:  n/a ml QGS ESV:  n/a ml LV Ejection Fraction: Study not gated        Rest ECG: NSR - Normal EKG  Stress ECG: No significant change from baseline ECG  QPS Raw Data Images:  Normal; no motion artifact; normal heart/lung ratio. Stress Images:  Normal homogeneous uptake in all areas of the myocardium. Rest Images:  Normal homogeneous uptake in all areas of the myocardium. Subtraction (SDS):  No evidence of ischemia.  Impression Exercise Capacity:  Lexiscan with no exercise. BP Response:  Normal blood pressure response. Clinical Symptoms:  Jaw pain ECG Impression:  No significant ST segment change suggestive of ischemia. Comparison with Prior Nuclear Study: No significant change from previous study  Overall Impression:  Normal stress nuclear study.  LV Wall Motion:  NL LV Function; NL Wall Motion   Runell Gess, MD  05/06/2012 2:33 PM

## 2012-05-13 ENCOUNTER — Encounter (HOSPITAL_COMMUNITY): Payer: Self-pay

## 2012-05-24 DIAGNOSIS — J018 Other acute sinusitis: Secondary | ICD-10-CM | POA: Diagnosis not present

## 2012-05-24 DIAGNOSIS — J209 Acute bronchitis, unspecified: Secondary | ICD-10-CM | POA: Diagnosis not present

## 2012-08-04 DIAGNOSIS — J209 Acute bronchitis, unspecified: Secondary | ICD-10-CM | POA: Diagnosis not present

## 2012-08-17 DIAGNOSIS — B356 Tinea cruris: Secondary | ICD-10-CM | POA: Diagnosis not present

## 2012-08-17 DIAGNOSIS — L538 Other specified erythematous conditions: Secondary | ICD-10-CM | POA: Diagnosis not present

## 2012-08-17 DIAGNOSIS — B351 Tinea unguium: Secondary | ICD-10-CM | POA: Diagnosis not present

## 2012-08-23 DIAGNOSIS — B9689 Other specified bacterial agents as the cause of diseases classified elsewhere: Secondary | ICD-10-CM | POA: Diagnosis not present

## 2012-08-23 DIAGNOSIS — A499 Bacterial infection, unspecified: Secondary | ICD-10-CM | POA: Diagnosis not present

## 2012-08-23 DIAGNOSIS — J209 Acute bronchitis, unspecified: Secondary | ICD-10-CM | POA: Diagnosis not present

## 2012-08-23 DIAGNOSIS — R062 Wheezing: Secondary | ICD-10-CM | POA: Diagnosis not present

## 2012-09-05 ENCOUNTER — Emergency Department (HOSPITAL_COMMUNITY): Payer: Medicare Other

## 2012-09-05 ENCOUNTER — Encounter (HOSPITAL_COMMUNITY): Payer: Self-pay | Admitting: *Deleted

## 2012-09-05 ENCOUNTER — Encounter: Payer: Self-pay | Admitting: Cardiology

## 2012-09-05 ENCOUNTER — Encounter: Payer: Self-pay | Admitting: Emergency Medicine

## 2012-09-05 ENCOUNTER — Inpatient Hospital Stay (HOSPITAL_COMMUNITY)
Admission: EM | Admit: 2012-09-05 | Discharge: 2012-09-18 | DRG: 234 | Disposition: A | Discharge status: Home / Self Care | Payer: Medicare Other | Attending: Thoracic Surgery (Cardiothoracic Vascular Surgery) | Admitting: Thoracic Surgery (Cardiothoracic Vascular Surgery)

## 2012-09-05 DIAGNOSIS — E785 Hyperlipidemia, unspecified: Secondary | ICD-10-CM

## 2012-09-05 DIAGNOSIS — R7309 Other abnormal glucose: Secondary | ICD-10-CM | POA: Diagnosis not present

## 2012-09-05 DIAGNOSIS — Z79899 Other long term (current) drug therapy: Secondary | ICD-10-CM | POA: Diagnosis not present

## 2012-09-05 DIAGNOSIS — I498 Other specified cardiac arrhythmias: Secondary | ICD-10-CM | POA: Diagnosis not present

## 2012-09-05 DIAGNOSIS — I200 Unstable angina: Secondary | ICD-10-CM | POA: Diagnosis not present

## 2012-09-05 DIAGNOSIS — I252 Old myocardial infarction: Secondary | ICD-10-CM

## 2012-09-05 DIAGNOSIS — F17200 Nicotine dependence, unspecified, uncomplicated: Secondary | ICD-10-CM

## 2012-09-05 DIAGNOSIS — Z9861 Coronary angioplasty status: Secondary | ICD-10-CM | POA: Diagnosis not present

## 2012-09-05 DIAGNOSIS — I519 Heart disease, unspecified: Secondary | ICD-10-CM | POA: Diagnosis not present

## 2012-09-05 DIAGNOSIS — I739 Peripheral vascular disease, unspecified: Secondary | ICD-10-CM | POA: Diagnosis not present

## 2012-09-05 DIAGNOSIS — I70209 Unspecified atherosclerosis of native arteries of extremities, unspecified extremity: Secondary | ICD-10-CM | POA: Diagnosis present

## 2012-09-05 DIAGNOSIS — E8779 Other fluid overload: Secondary | ICD-10-CM | POA: Diagnosis not present

## 2012-09-05 DIAGNOSIS — R7989 Other specified abnormal findings of blood chemistry: Secondary | ICD-10-CM | POA: Diagnosis present

## 2012-09-05 DIAGNOSIS — J438 Other emphysema: Secondary | ICD-10-CM | POA: Diagnosis not present

## 2012-09-05 DIAGNOSIS — I808 Phlebitis and thrombophlebitis of other sites: Secondary | ICD-10-CM | POA: Diagnosis not present

## 2012-09-05 DIAGNOSIS — J9819 Other pulmonary collapse: Secondary | ICD-10-CM | POA: Diagnosis not present

## 2012-09-05 DIAGNOSIS — Z7982 Long term (current) use of aspirin: Secondary | ICD-10-CM

## 2012-09-05 DIAGNOSIS — J90 Pleural effusion, not elsewhere classified: Secondary | ICD-10-CM | POA: Diagnosis not present

## 2012-09-05 DIAGNOSIS — Z881 Allergy status to other antibiotic agents status: Secondary | ICD-10-CM

## 2012-09-05 DIAGNOSIS — Z882 Allergy status to sulfonamides status: Secondary | ICD-10-CM

## 2012-09-05 DIAGNOSIS — Z720 Tobacco use: Secondary | ICD-10-CM | POA: Diagnosis present

## 2012-09-05 DIAGNOSIS — I2510 Atherosclerotic heart disease of native coronary artery without angina pectoris: Secondary | ICD-10-CM | POA: Diagnosis not present

## 2012-09-05 DIAGNOSIS — D62 Acute posthemorrhagic anemia: Secondary | ICD-10-CM | POA: Diagnosis not present

## 2012-09-05 DIAGNOSIS — I4891 Unspecified atrial fibrillation: Secondary | ICD-10-CM | POA: Diagnosis not present

## 2012-09-05 DIAGNOSIS — R079 Chest pain, unspecified: Secondary | ICD-10-CM | POA: Diagnosis not present

## 2012-09-05 DIAGNOSIS — R072 Precordial pain: Secondary | ICD-10-CM | POA: Diagnosis not present

## 2012-09-05 DIAGNOSIS — Z8249 Family history of ischemic heart disease and other diseases of the circulatory system: Secondary | ICD-10-CM | POA: Diagnosis not present

## 2012-09-05 DIAGNOSIS — Z888 Allergy status to other drugs, medicaments and biological substances status: Secondary | ICD-10-CM

## 2012-09-05 DIAGNOSIS — J449 Chronic obstructive pulmonary disease, unspecified: Secondary | ICD-10-CM | POA: Diagnosis present

## 2012-09-05 DIAGNOSIS — Y832 Surgical operation with anastomosis, bypass or graft as the cause of abnormal reaction of the patient, or of later complication, without mention of misadventure at the time of the procedure: Secondary | ICD-10-CM | POA: Diagnosis not present

## 2012-09-05 DIAGNOSIS — Z7902 Long term (current) use of antithrombotics/antiplatelets: Secondary | ICD-10-CM | POA: Diagnosis not present

## 2012-09-05 DIAGNOSIS — J4489 Other specified chronic obstructive pulmonary disease: Secondary | ICD-10-CM | POA: Diagnosis present

## 2012-09-05 DIAGNOSIS — Y9210 Unspecified residential institution as the place of occurrence of the external cause: Secondary | ICD-10-CM | POA: Diagnosis not present

## 2012-09-05 HISTORY — DX: Atherosclerotic heart disease of native coronary artery without angina pectoris: I25.10

## 2012-09-05 HISTORY — DX: Hyperlipidemia, unspecified: E78.5

## 2012-09-05 HISTORY — DX: Chronic obstructive pulmonary disease, unspecified: J44.9

## 2012-09-05 HISTORY — DX: Acute myocardial infarction, unspecified: I21.9

## 2012-09-05 HISTORY — DX: Peripheral vascular disease, unspecified: I73.9

## 2012-09-05 LAB — COMPREHENSIVE METABOLIC PANEL
ALT: 56 U/L — ABNORMAL HIGH (ref 0–53)
AST: 38 U/L — ABNORMAL HIGH (ref 0–37)
Albumin: 3.2 g/dL — ABNORMAL LOW (ref 3.5–5.2)
Alkaline Phosphatase: 94 U/L (ref 39–117)
BUN: 14 mg/dL (ref 6–23)
CO2: 26 mEq/L (ref 19–32)
Calcium: 9.2 mg/dL (ref 8.4–10.5)
Chloride: 101 mEq/L (ref 96–112)
Creatinine, Ser: 1 mg/dL (ref 0.50–1.35)
GFR calc Af Amer: 89 mL/min — ABNORMAL LOW (ref >90)
GFR calc non Af Amer: 76 mL/min — ABNORMAL LOW (ref >90)
Glucose, Bld: 126 mg/dL — ABNORMAL HIGH (ref 70–99)
Potassium: 3.8 mEq/L (ref 3.5–5.1)
Sodium: 136 mEq/L (ref 135–145)
Total Bilirubin: 0.5 mg/dL (ref 0.3–1.2)
Total Protein: 6.4 g/dL (ref 6.0–8.3)

## 2012-09-05 LAB — HEMOGLOBIN A1C
Hgb A1c MFr Bld: 6.5 % — ABNORMAL HIGH (ref <5.7)
Mean Plasma Glucose: 140 mg/dL — ABNORMAL HIGH (ref <117)

## 2012-09-05 LAB — POCT I-STAT TROPONIN I: Troponin i, poc: 0.01 ng/mL (ref 0.00–0.08)

## 2012-09-05 LAB — CBC WITH DIFFERENTIAL/PLATELET
Basophils Absolute: 0 10*3/uL (ref 0.0–0.1)
Basophils Relative: 0 % (ref 0–1)
Eosinophils Absolute: 0.1 10*3/uL (ref 0.0–0.7)
Eosinophils Relative: 1 % (ref 0–5)
HCT: 40.9 % (ref 39.0–52.0)
Hemoglobin: 14.5 g/dL (ref 13.0–17.0)
Lymphocytes Relative: 21 % (ref 12–46)
Lymphs Abs: 2.9 10*3/uL (ref 0.7–4.0)
MCH: 29.3 pg (ref 26.0–34.0)
MCHC: 35.5 g/dL (ref 30.0–36.0)
MCV: 82.6 fL (ref 78.0–100.0)
Monocytes Absolute: 1 10*3/uL (ref 0.1–1.0)
Monocytes Relative: 7 % (ref 3–12)
Neutro Abs: 9.9 10*3/uL — ABNORMAL HIGH (ref 1.7–7.7)
Neutrophils Relative %: 71 % (ref 43–77)
Platelets: 186 10*3/uL (ref 150–400)
RBC: 4.95 MIL/uL (ref 4.22–5.81)
RDW: 14.3 % (ref 11.5–15.5)
WBC: 14 10*3/uL — ABNORMAL HIGH (ref 4.0–10.5)

## 2012-09-05 LAB — TROPONIN I
Troponin I: <0.3 ng/mL (ref <0.30)
Troponin I: <0.3 ng/mL (ref <0.30)

## 2012-09-05 LAB — HEPARIN LEVEL (UNFRACTIONATED): Heparin Unfractionated: 0.17 IU/mL — ABNORMAL LOW (ref 0.30–0.70)

## 2012-09-05 MED ORDER — ZOLPIDEM TARTRATE 5 MG PO TABS: 5.0000 mg | ORAL_TABLET | Freq: Every evening | ORAL | Status: DC | PRN

## 2012-09-05 MED ORDER — ASPIRIN EC 81 MG PO TBEC
81.0000 mg | DELAYED_RELEASE_TABLET | Freq: Every day | ORAL | Status: DC
  Administered 2012-09-06 – 2012-09-11 (×5): 81 mg via ORAL
  Filled 2012-09-05 (×7): qty 1

## 2012-09-05 MED ORDER — ASPIRIN 81 MG PO CHEW
324.0000 mg | CHEWABLE_TABLET | Freq: Once | ORAL | Status: AC
  Administered 2012-09-05: 324 mg via ORAL

## 2012-09-05 MED ORDER — CHOLESTYRAMINE 4 G PO PACK
1.0000 | PACK | Freq: Two times a day (BID) | ORAL | Status: DC
  Administered 2012-09-05 – 2012-09-18 (×23): 1 via ORAL
  Filled 2012-09-05 (×33): qty 1

## 2012-09-05 MED ORDER — ALPRAZOLAM 0.25 MG PO TABS: 0.2500 mg | ORAL_TABLET | Freq: Two times a day (BID) | ORAL | Status: DC | PRN

## 2012-09-05 MED ORDER — RISAQUAD PO CAPS
1.0000 | ORAL_CAPSULE | Freq: Every day | ORAL | Status: DC
  Administered 2012-09-07 – 2012-09-11 (×4): 1 via ORAL
  Filled 2012-09-05 (×7): qty 1

## 2012-09-05 MED ORDER — NITROGLYCERIN 0.4 MG SL SUBL: 0.4000 mg | SUBLINGUAL_TABLET | SUBLINGUAL | Status: DC | PRN

## 2012-09-05 MED ORDER — ASPIRIN 81 MG PO CHEW: 324.0000 mg | CHEWABLE_TABLET | ORAL | Status: DC

## 2012-09-05 MED ORDER — MOMETASONE FURO-FORMOTEROL FUM 100-5 MCG/ACT IN AERO: 2.0000 | INHALATION_SPRAY | Freq: Two times a day (BID) | RESPIRATORY_TRACT | Status: DC | PRN

## 2012-09-05 MED ORDER — EZETIMIBE 10 MG PO TABS
10.0000 mg | ORAL_TABLET | Freq: Every day | ORAL | Status: DC
  Administered 2012-09-05 – 2012-09-17 (×12): 10 mg via ORAL
  Filled 2012-09-05 (×15): qty 1

## 2012-09-05 MED ORDER — NEBIVOLOL HCL 2.5 MG PO TABS
2.5000 mg | ORAL_TABLET | Freq: Every day | ORAL | Status: DC
  Administered 2012-09-06 – 2012-09-11 (×5): 2.5 mg via ORAL
  Filled 2012-09-05 (×7): qty 1

## 2012-09-05 MED ORDER — REGADENOSON 0.4 MG/5ML IV SOLN
0.4000 mg | Freq: Once | INTRAVENOUS | Status: AC
  Administered 2012-09-06: 0.4 mg via INTRAVENOUS
  Filled 2012-09-05: qty 5

## 2012-09-05 MED ORDER — ONDANSETRON HCL 4 MG/2ML IJ SOLN: 4.0000 mg | Freq: Four times a day (QID) | INTRAMUSCULAR | Status: DC | PRN

## 2012-09-05 MED ORDER — HYDROCODONE-ACETAMINOPHEN 5-325 MG PO TABS
1.0000 | ORAL_TABLET | Freq: Four times a day (QID) | ORAL | Status: DC | PRN
  Administered 2012-09-08 (×2): 1 via ORAL
  Filled 2012-09-05: qty 2
  Filled 2012-09-05: qty 1

## 2012-09-05 MED ORDER — CLOPIDOGREL BISULFATE 75 MG PO TABS
75.0000 mg | ORAL_TABLET | Freq: Every day | ORAL | Status: DC
  Administered 2012-09-06 – 2012-09-07 (×2): 75 mg via ORAL
  Filled 2012-09-05 (×3): qty 1

## 2012-09-05 MED ORDER — ACETAMINOPHEN 325 MG PO TABS: 650.0000 mg | ORAL_TABLET | ORAL | Status: DC | PRN

## 2012-09-05 MED ORDER — ASPIRIN 300 MG RE SUPP
300.0000 mg | RECTAL | Status: DC
  Filled 2012-09-05: qty 1

## 2012-09-05 MED ORDER — LOSARTAN POTASSIUM 50 MG PO TABS
50.0000 mg | ORAL_TABLET | Freq: Every day | ORAL | Status: DC
  Administered 2012-09-05 – 2012-09-07 (×3): 50 mg via ORAL
  Filled 2012-09-05 (×5): qty 1

## 2012-09-05 MED ORDER — HEPARIN (PORCINE) IN NACL 100-0.45 UNIT/ML-% IJ SOLN
1500.0000 [IU]/h | INTRAMUSCULAR | Status: DC
  Administered 2012-09-05: 1150 [IU]/h via INTRAVENOUS
  Administered 2012-09-06 (×2): 1500 [IU]/h via INTRAVENOUS
  Filled 2012-09-05 (×4): qty 250

## 2012-09-05 MED ORDER — ASPIRIN 81 MG PO CHEW
CHEWABLE_TABLET | ORAL | Status: AC
  Filled 2012-09-05: qty 4

## 2012-09-05 MED ORDER — ATORVASTATIN CALCIUM 40 MG PO TABS
40.0000 mg | ORAL_TABLET | Freq: Every day | ORAL | Status: DC
  Administered 2012-09-06: 40 mg via ORAL
  Filled 2012-09-05 (×3): qty 1

## 2012-09-05 MED ORDER — PANTOPRAZOLE SODIUM 40 MG PO TBEC
40.0000 mg | DELAYED_RELEASE_TABLET | Freq: Two times a day (BID) | ORAL | Status: DC
  Administered 2012-09-05 – 2012-09-11 (×13): 40 mg via ORAL
  Filled 2012-09-05 (×13): qty 1

## 2012-09-05 MED ORDER — HEPARIN BOLUS VIA INFUSION
4000.0000 [IU] | Freq: Once | INTRAVENOUS | Status: AC
  Administered 2012-09-05: 4000 [IU] via INTRAVENOUS

## 2012-09-05 NOTE — ED Notes (Signed)
Pt has had mi and seven stents in past.  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  No sob or sweating.  No pain, but reports tightness

## 2012-09-05 NOTE — H&P (Signed)
Brian Brock is an 67 y.o. male.   Chief Complaint:  Chest Pain  HPI: The patient is a 67 y/o male, who has been followed at [facility] by Dr. Alanda Amass. He has known CAD. He has had prior proximal LAD, proximal circumflex and proximal-mid RCA stents from prior interventions by Dr. Alanda Amass. His last catheterization was in 2004 which showed patent LAD, patent circumflex and patent RCA stents. His last Myoview was in November 2013 and was normal. He had an Echo at that time which revealed normal systolic function with an EF of >55%. He also has known PAD with remote left SFA stenting in 2006 and right SFA stenting in 2008.  His most rencent ABIs, measured in November 2013, demonstrated a right ABI of 1.0 and a left ABI of 1.0 with no significant increased velocities and no evidence of restenosis. His other past history includes tobacco abuse. He reports smoking 1/2 ppd. He reports that he was in his usual state of health until this morning when he developed resting chest pain. He had no pain associated with his prior stents/MIs. This was a new type of pain, described as substernal and dull/achy/pressure-like. He had radiation to the neck. No exacerbating factors. Non-positional and did not change with exertion. No other associated symptoms. He denies SOB, diaphoresis, n/v, syncope/presyncope. No PND/orthopnea, hematochezia, melena or hematuria. The pain was responsive to SL NTG x 2. The pain was relieved within 20 minutes. He denies further chest pain. He reports compliance with all of his home medications, including daily ASA and plavix.     Past Medical History  Diagnosis Date  . Coronary artery disease   . Myocardial infarct   . Asthma   . COPD (chronic obstructive pulmonary disease)     Past Surgical History  Procedure Laterality Date  . Stents      seven cardiac stents    No family history on file. Social History:  reports that he has been smoking.  He does not have any smokeless tobacco  history on file. He reports that  drinks alcohol. He reports that he does not use illicit drugs.  Allergies:  Allergies  Allergen Reactions  . Amoxicillin-Pot Clavulanate   . Levofloxacin   . Meloxicam   . Metaxalone   . Sulfonamide Derivatives      (Not in a hospital admission)  Results for orders placed during the hospital encounter of 09/05/12 (from the past 48 hour(s))  CBC WITH DIFFERENTIAL     Status: Abnormal   Collection Time    09/05/12 12:10 PM      Result Value Range   WBC 14.0 (*) 4.0 - 10.5 K/uL   RBC 4.95  4.22 - 5.81 MIL/uL   Hemoglobin 14.5  13.0 - 17.0 g/dL   HCT 16.1  09.6 - 04.5 %   MCV 82.6  78.0 - 100.0 fL   MCH 29.3  26.0 - 34.0 pg   MCHC 35.5  30.0 - 36.0 g/dL   RDW 40.9  81.1 - 91.4 %   Platelets 186  150 - 400 K/uL   Neutrophils Relative 71  43 - 77 %   Neutro Abs 9.9 (*) 1.7 - 7.7 K/uL   Lymphocytes Relative 21  12 - 46 %   Lymphs Abs 2.9  0.7 - 4.0 K/uL   Monocytes Relative 7  3 - 12 %   Monocytes Absolute 1.0  0.1 - 1.0 K/uL   Eosinophils Relative 1  0 - 5 %  Eosinophils Absolute 0.1  0.0 - 0.7 K/uL   Basophils Relative 0  0 - 1 %   Basophils Absolute 0.0  0.0 - 0.1 K/uL  COMPREHENSIVE METABOLIC PANEL     Status: Abnormal   Collection Time    09/05/12 12:10 PM      Result Value Range   Sodium 136  135 - 145 mEq/L   Potassium 3.8  3.5 - 5.1 mEq/L   Chloride 101  96 - 112 mEq/L   CO2 26  19 - 32 mEq/L   Glucose, Bld 126 (*) 70 - 99 mg/dL   BUN 14  6 - 23 mg/dL   Creatinine, Ser 4.09  0.50 - 1.35 mg/dL   Calcium 9.2  8.4 - 81.1 mg/dL   Total Protein 6.4  6.0 - 8.3 g/dL   Albumin 3.2 (*) 3.5 - 5.2 g/dL   AST 38 (*) 0 - 37 U/L   ALT 56 (*) 0 - 53 U/L   Alkaline Phosphatase 94  39 - 117 U/L   Total Bilirubin 0.5  0.3 - 1.2 mg/dL   GFR calc non Af Amer 76 (*) >90 mL/min   GFR calc Af Amer 89 (*) >90 mL/min   Comment:            The eGFR has been calculated     using the CKD EPI equation.     This calculation has not been      validated in all clinical     situations.     eGFR's persistently     <90 mL/min signify     possible Chronic Kidney Disease.  POCT I-STAT TROPONIN I     Status: None   Collection Time    09/05/12 12:28 PM      Result Value Range   Troponin i, poc 0.01  0.00 - 0.08 ng/mL   Comment 3            Comment: Due to the release kinetics of cTnI,     a negative result within the first hours     of the onset of symptoms does not rule out     myocardial infarction with certainty.     If myocardial infarction is still suspected,     repeat the test at appropriate intervals.   Dg Chest 2 View  09/05/2012  *RADIOLOGY REPORT*  Clinical Data: Chest pain  CHEST - 2 VIEW  Comparison: Chest radiograph November 05, 2008 and chest CT May 21, 2009  Findings:  There is underlying emphysematous change.  There is minimal scarring in the left base.  There is no edema or consolidation.  Heart size and pulmonary vascularity are normal. No adenopathy.  No bone lesions.  IMPRESSION: No edema or consolidation.  There is a degree of underlying emphysematous change.   Original Report Authenticated By: Bretta Bang, M.D.     Review of Systems  Constitutional: Negative for fever, chills, malaise/fatigue and diaphoresis.  HENT: Positive for neck pain. Negative for congestion and sore throat.   Respiratory: Positive for cough. Negative for shortness of breath and wheezing.   Cardiovascular: Positive for chest pain. Negative for palpitations, orthopnea, claudication, leg swelling and PND.  Gastrointestinal: Negative for nausea, vomiting, abdominal pain, blood in stool and melena.  Genitourinary: Negative for hematuria.  Musculoskeletal: Negative for back pain.  Neurological: Negative for dizziness, loss of consciousness and weakness.    Blood pressure 138/70, pulse 55, temperature 97.6 F (36.4 C), temperature source Oral, resp. rate  15, SpO2 97.00%. Physical Exam  Constitutional: He appears well-developed and  well-nourished. No distress.  HENT:  Head: Normocephalic and atraumatic.  Eyes: Conjunctivae and EOM are normal. Pupils are equal, round, and reactive to light.  Neck: Normal range of motion. No JVD present. Carotid bruit is not present.  Cardiovascular: Normal rate, regular rhythm, normal heart sounds and intact distal pulses.  Exam reveals no gallop and no friction rub.   No murmur heard. Pulses:      Radial pulses are 2+ on the right side, and 2+ on the left side.       Dorsalis pedis pulses are 2+ on the right side, and 2+ on the left side.  Respiratory: Effort normal and breath sounds normal. No respiratory distress. He has no wheezes. He has no rales.  Musculoskeletal: He exhibits no edema.  Skin: Skin is warm and dry. He is not diaphoretic.  Psychiatric: He has a normal mood and affect. His behavior is normal.     Assessment/Plan Principal Problem:   Chest pain at rest Active Problems:   CAD- remote stenting to LAD, Circumflex and RCA   HYPERLIPIDEMIA   TOBACCO ABUSE   BRONCHITIS, CHRONIC  Plan: Pt describes resting substernal chest pressure that was relieved with SL NTG. The patient's symptoms are concerning for unstable angina. His EKG also shows new TWIs, compared to prior EKG in 11/13. Considering past history of CAD, history of tobacco abuse, symptomatology and EKG findings, will admit to our service. Will cycle enzymes to rule out MI. If he rules in, plan for [facility] tomorrow. If he rules out, plan for [facility] NST tomorrow. Will place on IV heparin. Will make NPO starting at midnight.   Allayne Butcher, PA-C  09/05/2012, 2:36 PM   Agree with note written by Boyce Medici  PAC  Known CAD as outlined above as well as PVOD and ongoing CRF. Pt of RAW. Pt had worrisome CP lasting 15-20 min relieved with SL NTG. There are more pronounced lat TWI. Currently pain free. Exam benign. Labs ok. Plan admit, IV hep, cycle enz, lexiscan myoview in am if enz neg and no recurrent  CP. Otherwise cath.   Runell Gess 09/05/2012 4:02 PM

## 2012-09-05 NOTE — Progress Notes (Signed)
ANTICOAGULATION CONSULT NOTE - Initial Consult  Pharmacy Consult for Heparin Indication: chest pain/ACS  Allergies  Allergen Reactions  . Amoxicillin-Pot Clavulanate     Unknown reaction  . Levofloxacin     Unknown reaction  . Meloxicam     Unknown reaction  . Metaxalone     Unknown reaction  . Sulfonamide Derivatives     unknown reaction    Patient Measurements: Height: 5' 9.5" (176.5 cm) Weight: 192 lb (87.091 kg) IBW/kg (Calculated) : 71.85 Heparin Dosing Weight: 87 kg  Vital Signs: Temp: 97.6 F (36.4 C) (04/21 1159) Temp src: Oral (04/21 1159) BP: 135/69 mmHg (04/21 1500) Pulse Rate: 59 (04/21 1500)  Labs:  Recent Labs  09/05/12 1210  HGB 14.5  HCT 40.9  PLT 186  CREATININE 1.00    Estimated Creatinine Clearance: 80.2 ml/min (by C-G formula based on Cr of 1).   Medical History: Past Medical History  Diagnosis Date  . Coronary artery disease   . Myocardial infarct   . Asthma   . COPD (chronic obstructive pulmonary disease)     Assessment: 67 y.o. M who presented to the [facility] on 09/05/12 with CP. The patient has known CAD and prior stents. Pharmacy was consulted to start heparin for anticoagulation while awaiting further cardiac evaluation.  Per discussion with the patient, he has had no recent bleeding or surgeries, or hx CVA. The patient was taking ASA and plavix PTA. Baseline Hgb/Hct/Plt wnl, CrCl>60 ml/min, Heparin Wt: 87 kg  Goal of Therapy:  Heparin level 0.3-0.7 units/ml Monitor platelets by anticoagulation protocol: Yes   Plan:  1. Heparin bolus of 4000 units x 1  2. Initiate heparin drip at a rate of 1150 units/hr (11.5 ml/hr) 3. Will continue to monitor for any signs/symptoms of bleeding and will follow up with heparin level in 6 hours   Georgina Pillion, PharmD, BCPS Clinical Pharmacist Pager: [phone] 09/05/2012 3:29 PM

## 2012-09-05 NOTE — Plan of Care (Signed)
Problem: Phase II Progression Outcomes Goal: Stress Test if indicated Outcome: Progressing Scheduled for 4/22

## 2012-09-05 NOTE — ED Provider Notes (Signed)
History     CSN: [id number]  Arrival date & time 09/05/12  1151   First MD Initiated Contact with Patient 09/05/12 1208      Chief Complaint  Patient presents with  . Chest Pain    (Consider location/radiation/quality/duration/timing/severity/associated sxs/prior treatment) Patient is a 67 y.o. male presenting with chest pain. The history is provided by the patient and the spouse.  Chest Pain Associated symptoms: fatigue   Associated symptoms: no abdominal pain, no back pain, no headache, no nausea, no shortness of breath and not vomiting    acute onset of chest pain at 0955. Lasted for 25 minutes eventually resolved with taking 2 nitroglycerin. Patient did not have any aspirin today so far. Patient has a history of coronary artery disease is followed by [facility] heart and vascular. Has had 7 stents last one was 4-5 years ago. Patient is now chest pain-free the pain when present was on both sides left and right was described as a 6-8/10. As stated lasted for 25 minutes and went away with nitroglycerin. Associated with shortness of breath diaphoresis nausea vomiting.  Past Medical History  Diagnosis Date  . Coronary artery disease   . Myocardial infarct   . Asthma   . COPD (chronic obstructive pulmonary disease)     Past Surgical History  Procedure Laterality Date  . Stents      seven cardiac stents    No family history on file.  History  Substance Use Topics  . Smoking status: Current Every Day Smoker  . Smokeless tobacco: Not on file  . Alcohol Use: Yes     Comment: occ      Review of Systems  Constitutional: Positive for fatigue.  HENT: Negative for neck pain.   Eyes: Negative for redness.  Respiratory: Negative for shortness of breath.   Cardiovascular: Positive for chest pain. Negative for leg swelling.  Gastrointestinal: Negative for nausea, vomiting and abdominal pain.  Genitourinary: Negative for dysuria.  Musculoskeletal: Negative for back pain.   Skin: Negative for rash.  Neurological: Negative for headaches.  Hematological: Does not bruise/bleed easily.  Psychiatric/Behavioral: Negative for confusion.    Allergies  Amoxicillin-pot clavulanate; Levofloxacin; Meloxicam; Metaxalone; and Sulfonamide derivatives  Home Medications  No current outpatient prescriptions on file.  BP 132/73  Pulse 57  Temp(Src) 97.6 F (36.4 C) (Oral)  Resp 20  SpO2 96%  Physical Exam  Nursing note and vitals reviewed. Constitutional: He is oriented to person, place, and time. He appears well-developed and well-nourished. No distress.  HENT:  Head: Normocephalic and atraumatic.  Mouth/Throat: Oropharynx is clear and moist.  Eyes: Conjunctivae and EOM are normal. Pupils are equal, round, and reactive to light.  Neck: Normal range of motion. Neck supple.  Cardiovascular: Normal rate and regular rhythm.   No murmur heard. Pulmonary/Chest: Effort normal and breath sounds normal. He has no rales.  Abdominal: Bowel sounds are normal. There is no tenderness.  Musculoskeletal: Normal range of motion. He exhibits no edema.  Neurological: He is alert and oriented to person, place, and time. No cranial nerve deficit. He exhibits normal muscle tone.  Skin: Skin is warm. No rash noted.    ED Course  Procedures (including critical care time)  Labs Reviewed  CBC WITH DIFFERENTIAL - Abnormal; Notable for the following:    WBC 14.0 (*)    Neutro Abs 9.9 (*)    All other components within normal limits  COMPREHENSIVE METABOLIC PANEL - Abnormal; Notable for the following:    Glucose,  Bld 126 (*)    Albumin 3.2 (*)    AST 38 (*)    ALT 56 (*)    GFR calc non Af Amer 76 (*)    GFR calc Af Amer 89 (*)    All other components within normal limits  POCT I-STAT TROPONIN I   Dg Chest 2 View  09/05/2012  *RADIOLOGY REPORT*  Clinical Data: Chest pain  CHEST - 2 VIEW  Comparison: Chest radiograph November 05, 2008 and chest CT May 21, 2009  Findings:  There  is underlying emphysematous change.  There is minimal scarring in the left base.  There is no edema or consolidation.  Heart size and pulmonary vascularity are normal. No adenopathy.  No bone lesions.  IMPRESSION: No edema or consolidation.  There is a degree of underlying emphysematous change.   Original Report Authenticated By: Bretta Bang, M.D.    Results for orders placed during the hospital encounter of 09/05/12  CBC WITH DIFFERENTIAL      Result Value Range   WBC 14.0 (*) 4.0 - 10.5 K/uL   RBC 4.95  4.22 - 5.81 MIL/uL   Hemoglobin 14.5  13.0 - 17.0 g/dL   HCT 40.9  81.1 - 91.4 %   MCV 82.6  78.0 - 100.0 fL   MCH 29.3  26.0 - 34.0 pg   MCHC 35.5  30.0 - 36.0 g/dL   RDW 78.2  95.6 - 21.3 %   Platelets 186  150 - 400 K/uL   Neutrophils Relative 71  43 - 77 %   Neutro Abs 9.9 (*) 1.7 - 7.7 K/uL   Lymphocytes Relative 21  12 - 46 %   Lymphs Abs 2.9  0.7 - 4.0 K/uL   Monocytes Relative 7  3 - 12 %   Monocytes Absolute 1.0  0.1 - 1.0 K/uL   Eosinophils Relative 1  0 - 5 %   Eosinophils Absolute 0.1  0.0 - 0.7 K/uL   Basophils Relative 0  0 - 1 %   Basophils Absolute 0.0  0.0 - 0.1 K/uL  COMPREHENSIVE METABOLIC PANEL      Result Value Range   Sodium 136  135 - 145 mEq/L   Potassium 3.8  3.5 - 5.1 mEq/L   Chloride 101  96 - 112 mEq/L   CO2 26  19 - 32 mEq/L   Glucose, Bld 126 (*) 70 - 99 mg/dL   BUN 14  6 - 23 mg/dL   Creatinine, Ser 0.86  0.50 - 1.35 mg/dL   Calcium 9.2  8.4 - 57.8 mg/dL   Total Protein 6.4  6.0 - 8.3 g/dL   Albumin 3.2 (*) 3.5 - 5.2 g/dL   AST 38 (*) 0 - 37 U/L   ALT 56 (*) 0 - 53 U/L   Alkaline Phosphatase 94  39 - 117 U/L   Total Bilirubin 0.5  0.3 - 1.2 mg/dL   GFR calc non Af Amer 76 (*) >90 mL/min   GFR calc Af Amer 89 (*) >90 mL/min  POCT I-STAT TROPONIN I      Result Value Range   Troponin i, poc 0.01  0.00 - 0.08 ng/mL   Comment 3             Date: 09/05/2012  Rate: 59  Rhythm: sinus bradycardia  QRS Axis: normal  Intervals: normal   ST/T Wave abnormalities: nonspecific T wave changes  Conduction Disutrbances:none  Narrative Interpretation:   Old EKG Reviewed: none available  1. Chest pain       MDM  Patient with known coronary artery disease. Has had 7 stents in the past last one placed 4-5 years ago. New onset of chest pain this morning at 955. Lasted for 25 minutes and resolved. No chest pain here in the emergency department. It resolved with 2 nitroglycerin. Have contacted his cardiologist's [facility] to see him. Troponin was negative EKG without acute changes and chest x-ray without evidence of pneumonia or pulmonary edema. Cardiology will see and disc though. The patient has remained pain-free in the department.        Shelda Jakes, MD 09/05/12 1318

## 2012-09-06 ENCOUNTER — Observation Stay (HOSPITAL_COMMUNITY): Payer: Medicare Other

## 2012-09-06 ENCOUNTER — Encounter (HOSPITAL_COMMUNITY): Payer: Self-pay | Admitting: Cardiology

## 2012-09-06 DIAGNOSIS — I739 Peripheral vascular disease, unspecified: Secondary | ICD-10-CM | POA: Diagnosis present

## 2012-09-06 DIAGNOSIS — R7989 Other specified abnormal findings of blood chemistry: Secondary | ICD-10-CM | POA: Diagnosis present

## 2012-09-06 DIAGNOSIS — R072 Precordial pain: Secondary | ICD-10-CM | POA: Diagnosis not present

## 2012-09-06 HISTORY — PX: OTHER SURGICAL HISTORY: SHX169

## 2012-09-06 LAB — BASIC METABOLIC PANEL
BUN: 15 mg/dL (ref 6–23)
CO2: 27 mEq/L (ref 19–32)
Calcium: 8.6 mg/dL (ref 8.4–10.5)
Chloride: 103 mEq/L (ref 96–112)
Creatinine, Ser: 1.02 mg/dL (ref 0.50–1.35)
GFR calc Af Amer: 86 mL/min — ABNORMAL LOW (ref >90)
GFR calc non Af Amer: 75 mL/min — ABNORMAL LOW (ref >90)
Glucose, Bld: 125 mg/dL — ABNORMAL HIGH (ref 70–99)
Potassium: 3.9 mEq/L (ref 3.5–5.1)
Sodium: 138 mEq/L (ref 135–145)

## 2012-09-06 LAB — LIPID PANEL
Cholesterol: 155 mg/dL (ref 0–200)
HDL: 52 mg/dL (ref >39)
LDL Cholesterol: 67 mg/dL (ref 0–99)
Total CHOL/HDL Ratio: 3 RATIO
Triglycerides: 179 mg/dL — ABNORMAL HIGH (ref <150)
VLDL: 36 mg/dL (ref 0–40)

## 2012-09-06 LAB — CBC
HCT: 39.3 % (ref 39.0–52.0)
Hemoglobin: 13.8 g/dL (ref 13.0–17.0)
MCH: 28.9 pg (ref 26.0–34.0)
MCHC: 35.1 g/dL (ref 30.0–36.0)
MCV: 82.4 fL (ref 78.0–100.0)
Platelets: 173 10*3/uL (ref 150–400)
RBC: 4.77 MIL/uL (ref 4.22–5.81)
RDW: 14.4 % (ref 11.5–15.5)
WBC: 12.2 10*3/uL — ABNORMAL HIGH (ref 4.0–10.5)

## 2012-09-06 LAB — HEPARIN LEVEL (UNFRACTIONATED)
Heparin Unfractionated: 0.43 IU/mL (ref 0.30–0.70)
Heparin Unfractionated: 0.49 IU/mL (ref 0.30–0.70)

## 2012-09-06 LAB — PROTIME-INR
INR: 0.94 (ref 0.00–1.49)
Prothrombin Time: 12.5 seconds (ref 11.6–15.2)

## 2012-09-06 LAB — TROPONIN I: Troponin I: <0.3 ng/mL (ref <0.30)

## 2012-09-06 MED ORDER — SODIUM CHLORIDE 0.9 % IV SOLN: 1.0000 mL/kg/h | INTRAVENOUS | Status: DC

## 2012-09-06 MED ORDER — TECHNETIUM TC 99M SESTAMIBI GENERIC - CARDIOLITE
10.0000 | Freq: Once | INTRAVENOUS | Status: AC | PRN
  Administered 2012-09-06: 10 via INTRAVENOUS

## 2012-09-06 MED ORDER — REGADENOSON 0.4 MG/5ML IV SOLN
INTRAVENOUS | Status: AC
  Filled 2012-09-06: qty 5

## 2012-09-06 MED ORDER — ALPRAZOLAM 0.25 MG PO TABS: 0.2500 mg | ORAL_TABLET | Freq: Three times a day (TID) | ORAL | Status: DC | PRN

## 2012-09-06 MED ORDER — DIAZEPAM 5 MG PO TABS
5.0000 mg | ORAL_TABLET | ORAL | Status: AC
  Administered 2012-09-07: 5 mg via ORAL
  Filled 2012-09-06: qty 1

## 2012-09-06 MED ORDER — HEPARIN BOLUS VIA INFUSION
2500.0000 [IU] | Freq: Once | INTRAVENOUS | Status: AC
  Administered 2012-09-06: 2500 [IU] via INTRAVENOUS
  Filled 2012-09-06: qty 2500

## 2012-09-06 MED ORDER — TECHNETIUM TC 99M SESTAMIBI GENERIC - CARDIOLITE
30.0000 | Freq: Once | INTRAVENOUS | Status: AC | PRN
  Administered 2012-09-06: 30 via INTRAVENOUS

## 2012-09-06 NOTE — Progress Notes (Signed)
Subjective:  No chest pain this am.  Objective:  Vital Signs in the last 24 hours: Temp:  [97.6 F (36.4 C)-98.2 F (36.8 C)] 98.2 F (36.8 C) (04/22 0556) Pulse Rate:  [51-68] 55 (04/22 0928) Resp:  [15-22] 18 (04/22 0556) BP: (102-147)/(61-77) 120/61 mmHg (04/22 0928) SpO2:  [93 %-98 %] 93 % (04/22 0556) Weight:  [87.091 kg (192 lb)] 87.091 kg (192 lb) (04/21 1430)  Intake/Output from previous day:  Intake/Output Summary (Last 24 hours) at 09/06/12 0929 Last data filed at 09/05/12 1700  Gross per 24 hour  Intake 394.88 ml  Output      0 ml  Net 394.88 ml    Physical Exam: General appearance: alert, cooperative and no distress Lungs: clear to auscultation bilaterally Heart: regular rate and rhythm   Rate: 68  Rhythm: normal sinus rhythm  Lab Results:  Recent Labs  09/05/12 1210 09/06/12 0455  WBC 14.0* 12.2*  HGB 14.5 13.8  PLT 186 173    Recent Labs  09/05/12 1210 09/06/12 0455  NA 136 138  K 3.8 3.9  CL 101 103  CO2 26 27  GLUCOSE 126* 125*  BUN 14 15  CREATININE 1.00 1.02    Recent Labs  09/05/12 2217 09/06/12 0455  TROPONINI <0.30 <0.30   Hepatic Function Panel  Recent Labs  09/05/12 1210  PROT 6.4  ALBUMIN 3.2*  AST 38*  ALT 56*  ALKPHOS 94  BILITOT 0.5    Recent Labs  09/06/12 0455  CHOL 155    Recent Labs  09/06/12 0455  INR 0.94    Imaging: Imaging results have been reviewed  Cardiac Studies:  Assessment/Plan:   Principal Problem:   Unstable angina Active Problems:    EKG abnormality- transient TWI   HYPERLIPIDEMIA   BRONCHITIS, CHRONIC- finnished course of ABs and steroids 4/19   CAD- prior LAD and RCA stenting ('97 and '98) CFX stent '04   Elevated LFTs   TOBACCO ABUSE   PVD- Lt SFA stent '04, Rt SFA stent '08   Plan- Myoview today. Troponin negative X 3.   Corine Shelter PA-C 09/06/2012, 9:29 AM

## 2012-09-06 NOTE — Progress Notes (Signed)
ANTICOAGULATION CONSULT NOTE - Follow Up Consult  Pharmacy Consult for Heparin Indication: chest pain/ACS  Allergies  Allergen Reactions  . Amoxicillin-Pot Clavulanate     Unknown reaction  . Levofloxacin     Unknown reaction  . Meloxicam     Unknown reaction  . Metaxalone     Unknown reaction  . Sulfonamide Derivatives     unknown reaction    Patient Measurements: Height: 5' 9.5" (176.5 cm) Weight: 192 lb (87.091 kg) IBW/kg (Calculated) : 71.85 Heparin Dosing Weight: 87 kg  Vital Signs: Temp: 98.4 F (36.9 C) (04/22 1345) Temp src: Oral (04/22 0556) BP: 125/74 mmHg (04/22 1345) Pulse Rate: 61 (04/22 1345)  Labs:  Recent Labs  09/05/12 1210 09/05/12 1714 09/05/12 2217 09/05/12 2324 09/06/12 0455 09/06/12 1256  HGB 14.5  --   --   --  13.8  --   HCT 40.9  --   --   --  39.3  --   PLT 186  --   --   --  173  --   LABPROT  --   --   --   --  12.5  --   INR  --   --   --   --  0.94  --   HEPARINUNFRC  --   --   --  0.17* 0.43 0.49  CREATININE 1.00  --   --   --  1.02  --   TROPONINI  --  <0.30 <0.30  --  <0.30  --     Estimated Creatinine Clearance: 78.6 ml/min (by C-G formula based on Cr of 1.02).   Medical History: Past Medical History  Diagnosis Date  . Coronary artery disease     Multiple stents  . Myocardial infarct   . Asthma   . COPD (chronic obstructive pulmonary disease)   . Peripheral vascular disease     prior stenting  . Dyslipidemia     Assessment: 67 y.o. M who presented to the [facility] on 09/05/12 with CP. Pt now s/p stress test with abnormal results and plans for cardiac cath tomorrow.  Heparin level (0.49) is at-goal on 1500 units/hr.   Goal of Therapy:  Heparin level 0.3-0.7 units/ml Monitor platelets by anticoagulation protocol: Yes   Plan:  1. Continue IV heparin infusion at 1500 units/hr.  2. Daily heparin level and CBC 3. Follow-up cath plans for 4/23.  [employer], Pharm.D., BCPS Clinical Pharmacist Pager  [phone] 09/06/2012 2:49 PM

## 2012-09-06 NOTE — Care Management (Signed)
UR Completed Capri Veals Graves-Bigelow, RN,BSN [phone]  

## 2012-09-06 NOTE — Progress Notes (Signed)
ANTICOAGULATION CONSULT NOTE - Follow Up Consult  Pharmacy Consult for Heparin Indication: chest pain/ACS  Allergies  Allergen Reactions  . Amoxicillin-Pot Clavulanate     Unknown reaction  . Levofloxacin     Unknown reaction  . Meloxicam     Unknown reaction  . Metaxalone     Unknown reaction  . Sulfonamide Derivatives     unknown reaction    Patient Measurements: Height: 5' 9.5" (176.5 cm) Weight: 192 lb (87.091 kg) IBW/kg (Calculated) : 71.85 Heparin Dosing Weight: 87 kg  Vital Signs: Temp: 98.1 F (36.7 C) (04/21 2119) Temp src: Oral (04/21 2119) BP: 118/71 mmHg (04/21 2119) Pulse Rate: 58 (04/21 2119)  Labs:  Recent Labs  09/05/12 1210 09/05/12 1714 09/05/12 2217 09/05/12 2324  HGB 14.5  --   --   --   HCT 40.9  --   --   --   PLT 186  --   --   --   HEPARINUNFRC  --   --   --  0.17*  CREATININE 1.00  --   --   --   TROPONINI  --  <0.30 <0.30  --     Estimated Creatinine Clearance: 80.2 ml/min (by C-G formula based on Cr of 1).   Medical History: Past Medical History  Diagnosis Date  . Coronary artery disease   . Myocardial infarct   . Asthma   . COPD (chronic obstructive pulmonary disease)     Assessment: 67 y.o. M who presented to the [facility] on 09/05/12 with CP. The patient has known CAD and prior stents. Pharmacy was consulted to start heparin for anticoagulation while awaiting further cardiac evaluation.  Per discussion with the patient, he has had no recent bleeding or surgeries, or hx CVA. The patient was taking ASA and plavix PTA. Baseline Hgb/Hct/Plt wnl, CrCl>60 ml/min, Heparin Wt: 87 kg  Heparin level (0.17) is below-goal on 1150 units/hr. No problem with line / infusion and no bleeding per RN.   Goal of Therapy:  Heparin level 0.3-0.7 units/ml Monitor platelets by anticoagulation protocol: Yes   Plan:  1. Heparin IV bolus 2500 units x 1, then increase IV infusion to 1500 units/hr.  2. Heparin level in 6 hours.   09/06/2012 12:07  AM

## 2012-09-06 NOTE — Progress Notes (Signed)
Myoview abnormal, read as small ant/apical ischemia. I reviewed EKG changes with MD and they are impressive-TWI in lead 1 and AVL that completely resolved. He is not the kind of pt to come to the hospital unless something was wrong. He is set up for cath with Dr Tresa Endo in am. Discussed with the pt and his wife and they are agreeable.  Corine Shelter PA-C 09/06/2012 1:21 PM

## 2012-09-06 NOTE — Progress Notes (Signed)
ANTICOAGULATION CONSULT NOTE - Follow Up Consult  Pharmacy Consult for Heparin Indication: chest pain/ACS  Allergies  Allergen Reactions  . Amoxicillin-Pot Clavulanate     Unknown reaction  . Levofloxacin     Unknown reaction  . Meloxicam     Unknown reaction  . Metaxalone     Unknown reaction  . Sulfonamide Derivatives     unknown reaction    Patient Measurements: Height: 5' 9.5" (176.5 cm) Weight: 192 lb (87.091 kg) IBW/kg (Calculated) : 71.85 Heparin Dosing Weight: 87 kg  Vital Signs: Temp: 98.2 F (36.8 C) (04/22 0556) Temp src: Oral (04/22 0556) BP: 102/66 mmHg (04/22 0556) Pulse Rate: 68 (04/22 0556)  Labs:  Recent Labs  09/05/12 1210 09/05/12 1714 09/05/12 2217 09/05/12 2324 09/06/12 0455  HGB 14.5  --   --   --  13.8  HCT 40.9  --   --   --  39.3  PLT 186  --   --   --  173  LABPROT  --   --   --   --  12.5  INR  --   --   --   --  0.94  HEPARINUNFRC  --   --   --  0.17* 0.43  CREATININE 1.00  --   --   --  1.02  TROPONINI  --  <0.30 <0.30  --  <0.30    Estimated Creatinine Clearance: 78.6 ml/min (by C-G formula based on Cr of 1.02).   Medical History: Past Medical History  Diagnosis Date  . Coronary artery disease   . Myocardial infarct   . Asthma   . COPD (chronic obstructive pulmonary disease)     Assessment: 67 y.o. M who presented to the [facility] on 09/05/12 with CP. The patient has known CAD and prior stents. Pharmacy was consulted to start heparin for anticoagulation while awaiting further cardiac evaluation.  Per discussion with the patient, he has had no recent bleeding or surgeries, or hx CVA. The patient was taking ASA and plavix PTA. Baseline Hgb/Hct/Plt wnl, CrCl>60 ml/min, Heparin Wt: 87 kg  Heparin level (0.43) is at-goal on 1500 units/hr.   Goal of Therapy:  Heparin level 0.3-0.7 units/ml Monitor platelets by anticoagulation protocol: Yes   Plan:  1. Continue IV heparin infusion at 1500 units/hr.  2. Heparin level in 6  hours to confirm dosing.  09/06/2012 7:23 AM

## 2012-09-07 ENCOUNTER — Encounter (HOSPITAL_COMMUNITY)
Admission: EM | Disposition: A | Discharge status: Home / Self Care | Payer: Self-pay | Source: Home / Self Care | Attending: Cardiovascular Disease

## 2012-09-07 ENCOUNTER — Encounter (HOSPITAL_COMMUNITY): Payer: Self-pay | Admitting: Thoracic Surgery (Cardiothoracic Vascular Surgery)

## 2012-09-07 ENCOUNTER — Other Ambulatory Visit: Payer: Self-pay | Admitting: *Deleted

## 2012-09-07 DIAGNOSIS — I2510 Atherosclerotic heart disease of native coronary artery without angina pectoris: Secondary | ICD-10-CM

## 2012-09-07 HISTORY — PX: LEFT HEART CATHETERIZATION WITH CORONARY ANGIOGRAM: SHX5451

## 2012-09-07 HISTORY — PX: CARDIAC CATHETERIZATION: SHX172

## 2012-09-07 LAB — SURGICAL PCR SCREEN
MRSA, PCR: NEGATIVE
Staphylococcus aureus: NEGATIVE

## 2012-09-07 LAB — BASIC METABOLIC PANEL
BUN: 13 mg/dL (ref 6–23)
CO2: 26 mEq/L (ref 19–32)
Calcium: 8.6 mg/dL (ref 8.4–10.5)
Chloride: 108 mEq/L (ref 96–112)
Creatinine, Ser: 1 mg/dL (ref 0.50–1.35)
GFR calc Af Amer: 89 mL/min — ABNORMAL LOW (ref >90)
GFR calc non Af Amer: 76 mL/min — ABNORMAL LOW (ref >90)
Glucose, Bld: 115 mg/dL — ABNORMAL HIGH (ref 70–99)
Potassium: 3.8 mEq/L (ref 3.5–5.1)
Sodium: 140 mEq/L (ref 135–145)

## 2012-09-07 LAB — CBC
HCT: 37 % — ABNORMAL LOW (ref 39.0–52.0)
Hemoglobin: 12.9 g/dL — ABNORMAL LOW (ref 13.0–17.0)
MCH: 28.9 pg (ref 26.0–34.0)
MCHC: 34.9 g/dL (ref 30.0–36.0)
MCV: 82.8 fL (ref 78.0–100.0)
Platelets: 180 10*3/uL (ref 150–400)
RBC: 4.47 MIL/uL (ref 4.22–5.81)
RDW: 14.7 % (ref 11.5–15.5)
WBC: 10.1 10*3/uL (ref 4.0–10.5)

## 2012-09-07 LAB — HEPARIN LEVEL (UNFRACTIONATED): Heparin Unfractionated: 0.54 IU/mL (ref 0.30–0.70)

## 2012-09-07 LAB — POCT ACTIVATED CLOTTING TIME: Activated Clotting Time: 116 seconds

## 2012-09-07 SURGERY — LEFT HEART CATHETERIZATION WITH CORONARY ANGIOGRAM
Anesthesia: LOCAL

## 2012-09-07 MED ORDER — HEPARIN (PORCINE) IN NACL 100-0.45 UNIT/ML-% IJ SOLN
1600.0000 [IU]/h | INTRAMUSCULAR | Status: DC
  Administered 2012-09-07: 1500 [IU]/h via INTRAVENOUS
  Administered 2012-09-08: 1600 [IU]/h via INTRAVENOUS
  Filled 2012-09-07 (×2): qty 250

## 2012-09-07 MED ORDER — FENTANYL CITRATE 0.05 MG/ML IJ SOLN
INTRAMUSCULAR | Status: AC
  Filled 2012-09-07: qty 2

## 2012-09-07 MED ORDER — ACETAMINOPHEN 325 MG PO TABS
650.0000 mg | ORAL_TABLET | ORAL | Status: DC | PRN
  Administered 2012-09-07 – 2012-09-08 (×4): 650 mg via ORAL
  Filled 2012-09-07 (×4): qty 2

## 2012-09-07 MED ORDER — ATORVASTATIN CALCIUM 80 MG PO TABS
80.0000 mg | ORAL_TABLET | Freq: Every day | ORAL | Status: DC
  Administered 2012-09-07 – 2012-09-17 (×10): 80 mg via ORAL
  Filled 2012-09-07 (×12): qty 1

## 2012-09-07 MED ORDER — HEPARIN (PORCINE) IN NACL 2-0.9 UNIT/ML-% IJ SOLN
INTRAMUSCULAR | Status: AC
  Filled 2012-09-07: qty 1000

## 2012-09-07 MED ORDER — MIDAZOLAM HCL 2 MG/2ML IJ SOLN
INTRAMUSCULAR | Status: AC
  Filled 2012-09-07: qty 2

## 2012-09-07 MED ORDER — LIDOCAINE HCL (PF) 1 % IJ SOLN
INTRAMUSCULAR | Status: AC
  Filled 2012-09-07: qty 30

## 2012-09-07 MED ORDER — ONDANSETRON HCL 4 MG/2ML IJ SOLN: 4.0000 mg | Freq: Four times a day (QID) | INTRAMUSCULAR | Status: DC | PRN

## 2012-09-07 MED ORDER — SODIUM CHLORIDE 0.9 % IV SOLN
INTRAVENOUS | Status: DC
  Administered 2012-09-07: 19:00:00 via INTRAVENOUS
  Administered 2012-09-10: 10 mL/h via INTRAVENOUS

## 2012-09-07 NOTE — Consult Note (Signed)
Reason for Consult:2 vessel CAD with unstable coronary syndrome Referring Physician: Dr. Kelly  Brian Brock is an 66 y.o. male.  HPI: 66 yo male with a long history of CAD and multiple previous stents presents with a cc/o chest pain.  Brian Brock is a 66 yo man who has a strong family history of CAD(father and brother), dyslipdemia, and a long standing history of tobacco abuse. He has had multiple previous PCI with stents in all 3 major coronaries, most recently in 2004. He had done well since that time until 3 days ago when he experienced an episode of prolonged CP. He was at work talking with a customer when he developed the sudden onset of severe substernal chest pain. This lasted about 15- 20 minutes. He suspected it was his heart and sought medical attention. He was admitted and ruled out for MI but a stress Cardiolite study was positive for apical ischemia. Today he underwent cardiac cathererization which revealed severe 2 vessel CAD with tandem 90% stenoses in the LAD and a 60-70% stenosis in the circumflex. The stent in the RCA was patent and there was no significant disease in the RCA. He currently is pain free.  Past Medical History  Diagnosis Date  . Coronary artery disease     Multiple stents  . Myocardial infarct   . Asthma   . COPD (chronic obstructive pulmonary disease)   . Peripheral vascular disease     prior stenting  . Dyslipidemia     Past Surgical History  Procedure Laterality Date  . Coronary stent placement  '97, '98, '04    seven cardiac stents  . Peripheral vascular dis Bilateral 7/06  8/08    Lt SFA '06, Rt SFA 8/08  . Cholecystectomy  June 2010    Family History  Problem Relation Age of Onset  . Coronary artery disease Father 44  . Coronary artery disease Brother 60    CABG in 2000    Social History:  reports that he has been smoking Cigarettes.  He has a 54 pack-year smoking history. He does not have any smokeless tobacco history on file. He  reports that  drinks alcohol. He reports that he does not use illicit drugs.  Allergies:  Allergies  Allergen Reactions  . Amoxicillin-Pot Clavulanate     Unknown reaction  . Levofloxacin     Unknown reaction  . Meloxicam     Unknown reaction  . Metaxalone     Unknown reaction  . Sulfonamide Derivatives     unknown reaction    Medications:  Prior to Admission:  Prescriptions prior to admission  Medication Sig Dispense Refill  . acidophilus (RISAQUAD) CAPS Take 1 capsule by mouth daily.      . aspirin EC 81 MG tablet Take 81 mg by mouth daily.      . cholestyramine (QUESTRAN) 4 G packet Take 1 packet by mouth 2 (two) times daily with a meal.      . clopidogrel (PLAVIX) 75 MG tablet Take 75 mg by mouth daily.      . ezetimibe (ZETIA) 10 MG tablet Take 10 mg by mouth every evening.      . losartan (COZAAR) 50 MG tablet Take 50 mg by mouth every evening.      . nebivolol (BYSTOLIC) 2.5 MG tablet Take 2.5 mg by mouth daily.      . pantoprazole (PROTONIX) 40 MG tablet Take 40 mg by mouth 2 (two) times daily.      .   simvastatin (ZOCOR) 80 MG tablet Take 80 mg by mouth at bedtime.      . HYDROcodone-acetaminophen (NORCO/VICODIN) 5-325 MG per tablet Take 1 tablet by mouth every 6 (six) hours as needed for pain.        Results for orders placed during the hospital encounter of 09/05/12 (from the past 48 hour(s))  HEMOGLOBIN A1C     Status: Abnormal   Collection Time    09/05/12  5:05 PM      Result Value Range   Hemoglobin A1C 6.5 (*) <5.7 %   Comment: (NOTE)                                                                               According to the ADA Clinical Practice Recommendations for 2011, when     HbA1c is used as a screening test:      >=6.5%   Diagnostic of Diabetes Mellitus               (if abnormal result is confirmed)     5.7-6.4%   Increased risk of developing Diabetes Mellitus     References:Diagnosis and Classification of Diabetes Mellitus,Diabetes      Care,2011,34(Suppl 1):S62-S69 and Standards of Medical Care in             Diabetes - 2011,Diabetes Care,2011,34 (Suppl 1):S11-S61.   Mean Plasma Glucose 140 (*) <117 mg/dL  TROPONIN I     Status: None   Collection Time    09/05/12  5:14 PM      Result Value Range   Troponin I <0.30  <0.30 ng/mL   Comment:            Due to the release kinetics of cTnI,     a negative result within the first hours     of the onset of symptoms does not rule out     myocardial infarction with certainty.     If myocardial infarction is still suspected,     repeat the test at appropriate intervals.  TROPONIN I     Status: None   Collection Time    09/05/12 10:17 PM      Result Value Range   Troponin I <0.30  <0.30 ng/mL   Comment:            Due to the release kinetics of cTnI,     a negative result within the first hours     of the onset of symptoms does not rule out     myocardial infarction with certainty.     If myocardial infarction is still suspected,     repeat the test at appropriate intervals.  HEPARIN LEVEL (UNFRACTIONATED)     Status: Abnormal   Collection Time    09/05/12 11:24 PM      Result Value Range   Heparin Unfractionated 0.17 (*) 0.30 - 0.70 IU/mL   Comment:            IF HEPARIN RESULTS ARE BELOW     EXPECTED VALUES, AND PATIENT     DOSAGE HAS BEEN CONFIRMED,     SUGGEST FOLLOW UP TESTING     OF ANTITHROMBIN III LEVELS.  TROPONIN   I     Status: None   Collection Time    09/06/12  4:55 AM      Result Value Range   Troponin I <0.30  <0.30 ng/mL   Comment:            Due to the release kinetics of cTnI,     a negative result within the first hours     of the onset of symptoms does not rule out     myocardial infarction with certainty.     If myocardial infarction is still suspected,     repeat the test at appropriate intervals.  CBC     Status: Abnormal   Collection Time    09/06/12  4:55 AM      Result Value Range   WBC 12.2 (*) 4.0 - 10.5 K/uL   RBC 4.77  4.22 -  5.81 MIL/uL   Hemoglobin 13.8  13.0 - 17.0 g/dL   HCT 39.3  39.0 - 52.0 %   MCV 82.4  78.0 - 100.0 fL   MCH 28.9  26.0 - 34.0 pg   MCHC 35.1  30.0 - 36.0 g/dL   RDW 14.4  11.5 - 15.5 %   Platelets 173  150 - 400 K/uL  BASIC METABOLIC PANEL     Status: Abnormal   Collection Time    09/06/12  4:55 AM      Result Value Range   Sodium 138  135 - 145 mEq/L   Potassium 3.9  3.5 - 5.1 mEq/L   Chloride 103  96 - 112 mEq/L   CO2 27  19 - 32 mEq/L   Glucose, Bld 125 (*) 70 - 99 mg/dL   BUN 15  6 - 23 mg/dL   Creatinine, Ser 1.02  0.50 - 1.35 mg/dL   Calcium 8.6  8.4 - 10.5 mg/dL   GFR calc non Af Amer 75 (*) >90 mL/min   GFR calc Af Amer 86 (*) >90 mL/min   Comment:            The eGFR has been calculated     using the CKD EPI equation.     This calculation has not been     validated in all clinical     situations.     eGFR's persistently     <90 mL/min signify     possible Chronic Kidney Disease.  PROTIME-INR     Status: None   Collection Time    09/06/12  4:55 AM      Result Value Range   Prothrombin Time 12.5  11.6 - 15.2 seconds   INR 0.94  0.00 - 1.49  LIPID PANEL     Status: Abnormal   Collection Time    09/06/12  4:55 AM      Result Value Range   Cholesterol 155  0 - 200 mg/dL   Triglycerides 179 (*) <150 mg/dL   HDL 52  >39 mg/dL   Total CHOL/HDL Ratio 3.0     VLDL 36  0 - 40 mg/dL   LDL Cholesterol 67  0 - 99 mg/dL   Comment:            Total Cholesterol/HDL:CHD Risk     Coronary Heart Disease Risk Table                         Men   Women      1/2 Average Risk   3.4     3.3      Average Risk       5.0   4.4      2 X Average Risk   9.6   7.1      3 X Average Risk  23.4   11.0                Use the calculated Patient Ratio     above and the CHD Risk Table     to determine the patient's CHD Risk.                ATP III CLASSIFICATION (LDL):      <100     mg/dL   Optimal      100-129  mg/dL   Near or Above                        Optimal      130-159  mg/dL    Borderline      160-189  mg/dL   High      >190     mg/dL   Very High  HEPARIN LEVEL (UNFRACTIONATED)     Status: None   Collection Time    09/06/12  4:55 AM      Result Value Range   Heparin Unfractionated 0.43  0.30 - 0.70 IU/mL   Comment:            IF HEPARIN RESULTS ARE BELOW     EXPECTED VALUES, AND PATIENT     DOSAGE HAS BEEN CONFIRMED,     SUGGEST FOLLOW UP TESTING     OF ANTITHROMBIN III LEVELS.  HEPARIN LEVEL (UNFRACTIONATED)     Status: None   Collection Time    09/06/12 12:56 PM      Result Value Range   Heparin Unfractionated 0.49  0.30 - 0.70 IU/mL   Comment:            IF HEPARIN RESULTS ARE BELOW     EXPECTED VALUES, AND PATIENT     DOSAGE HAS BEEN CONFIRMED,     SUGGEST FOLLOW UP TESTING     OF ANTITHROMBIN III LEVELS.  CBC     Status: Abnormal   Collection Time    09/07/12  5:12 AM      Result Value Range   WBC 10.1  4.0 - 10.5 K/uL   RBC 4.47  4.22 - 5.81 MIL/uL   Hemoglobin 12.9 (*) 13.0 - 17.0 g/dL   HCT 37.0 (*) 39.0 - 52.0 %   MCV 82.8  78.0 - 100.0 fL   MCH 28.9  26.0 - 34.0 pg   MCHC 34.9  30.0 - 36.0 g/dL   RDW 14.7  11.5 - 15.5 %   Platelets 180  150 - 400 K/uL  HEPARIN LEVEL (UNFRACTIONATED)     Status: None   Collection Time    09/07/12  5:12 AM      Result Value Range   Heparin Unfractionated 0.54  0.30 - 0.70 IU/mL   Comment:            IF HEPARIN RESULTS ARE BELOW     EXPECTED VALUES, AND PATIENT     DOSAGE HAS BEEN CONFIRMED,     SUGGEST FOLLOW UP TESTING     OF ANTITHROMBIN III LEVELS.  BASIC METABOLIC PANEL     Status: Abnormal   Collection Time    09/07/12  5:12 AM      Result Value   Range   Sodium 140  135 - 145 mEq/L   Potassium 3.8  3.5 - 5.1 mEq/L   Chloride 108  96 - 112 mEq/L   CO2 26  19 - 32 mEq/L   Glucose, Bld 115 (*) 70 - 99 mg/dL   BUN 13  6 - 23 mg/dL   Creatinine, Ser 1.00  0.50 - 1.35 mg/dL   Calcium 8.6  8.4 - 10.5 mg/dL   GFR calc non Af Amer 76 (*) >90 mL/min   GFR calc Af Amer 89 (*) >90 mL/min    Comment:            The eGFR has been calculated     using the CKD EPI equation.     This calculation has not been     validated in all clinical     situations.     eGFR's persistently     <90 mL/min signify     possible Chronic Kidney Disease.    Nm Myocar Multi W/spect W/wall Motion / Ef  09/06/2012  *RADIOLOGY REPORT*  Clinical Data:  66-year-old male with substernal chest pain. History of myocardial infarction and prior coronary artery stenting.  MYOCARDIAL IMAGING WITH SPECT (REST AND PHARMACOLOGIC-STRESS) GATED LEFT VENTRICULAR WALL MOTION STUDY LEFT VENTRICULAR EJECTION FRACTION  Technique:  Resting myocardial SPECT imaging was initially performed after intravenous administration of radiopharmaceutical. Myocardial SPECT was subsequently performed after additional radiopharmaceutical injection during pharmacologic-stress supervised by the Cardiology staff.  Quantitative gated imaging was also performed to evaluate left ventricular wall motion, and estimate left ventricular ejection fraction.  Radiopharmaceutical:  Tc-99m Cardiolite 10mCi at rest and 30mCi during stress.  Comparison: none  Findings:  Technique: Study is adequate.  Perfusion: There is a small focus of decreased counts within the apical segment of the anterolateral wall which improves from stress to rest.  No additional evidence of ischemia or infarction.  Wall motion:  No focal wall motion abnormality.  Normal contractility.  Left ventricular ejection fraction:  Calculated left ventricular ejection fraction = 66%.  End-diastolic volume equals 72 ml End-systolic volume equals 25 ml  IMPRESSION:  1. Small region of reversible ischemia within the apical segment of the anterolateral wall. 2.  Normal wall motion. 3.  Left ventricular ejection fraction equal66%  These results will be called to the ordering clinician or representative by the Radiologist Assistant, and communication documented in the PACS Dashboard.   Original Report  Authenticated By: Stewart Edmunds, M.D.     Review of Systems  Constitutional: Positive for malaise/fatigue.  Respiratory: Positive for shortness of breath.        Recent exacerbation of COPD/ bronchitis, treated with prednisone and antibiotics  Cardiovascular: Positive for chest pain.  Neurological: Negative.   All other systems reviewed and are negative.   Blood pressure 184/67, pulse 53, temperature 97.6 F (36.4 C), temperature source Oral, resp. rate 18, height 5' 9.5" (1.765 m), weight 192 lb (87.091 kg), SpO2 99.00%. Physical Exam  Vitals reviewed. Constitutional: He is oriented to person, place, and time. He appears well-developed and well-nourished. No distress.  HENT:  Head: Normocephalic and atraumatic.  Eyes: EOM are normal. Pupils are equal, round, and reactive to light.  Neck: Neck supple. No thyromegaly present.  No carotid bruits  Cardiovascular: Normal rate, regular rhythm, normal heart sounds and intact distal pulses.  Exam reveals no gallop and no friction rub.   No murmur heard. Respiratory: Effort normal and breath sounds normal. He has no wheezes. He has no   rales.  GI: Soft. There is no tenderness.  Musculoskeletal: He exhibits no edema.  Lymphadenopathy:    He has no cervical adenopathy.  Neurological: He is alert and oriented to person, place, and time. No cranial nerve deficit.  No focal motor deficit  Skin: Skin is warm and dry.  Psychiatric: He has a normal mood and affect.    CARDIAC CATHETERIZATION 09/07/12 AO: 130/65  LV: 130/12  LM: 30 - 40% smooth distal  LAD: 90% eccentric ostial, patent proximal stent, 90% eccentric stenosis post stent and septal  LCx: 60% ostial and proximal with patent LCX stent prior to OM take-off  RCA: patent proximal stent; without significant disease  L subclavian/LIMA: patent and suitable for CABG  LV: EF 60% with minimal focal mid inferior hypokinesis  AO: No RAS  Tolerated well  Rec: Surgical consultation for  probable CABG.  KELLY,THOMAS A, MD, FACC  09/07/2012  Assessment/Plan: 66 yo man with multiple CRF and known CAD presents with new onset unstable angina. At catheterization he has severe 2 vessel CAD including ostial LAD disease. CABG is indicated for survival benefit and relief of symptoms.   He is a candidate for off pump grafting  I have discussed with the patient and his wife the general nature of the procedure, the need for general anesthesia, and the incisions to be used. I discussed the expected hospital stay, overall recovery and short and long term outcomes. They understand the risks include, but are not limited to death, stroke, MI, DVT/PE, bleeding, possible need for transfusion, infections, irregular heart rhythms, and other organ system dysfunction, including respiratory, renal, or GI complications. He accepts these risks and agrees to proceed.  He has been on plavix and we need to allow for plavix "wash out" prior to surgery to avoid an excessive risk of bleeding and associated complications. Will stop plavix today and plan for CABG on Monday 4/28  Babita Amaker C 09/07/2012, 12:50 PM      

## 2012-09-07 NOTE — CV Procedure (Signed)
Cardiac Catheterization  [name], 67 y.o., male  Full note dictated; see diagram in chart  DICTATION # [phone], [id number]  AO: 130/65 LV: 130/12  LM: 30 - 40% smooth distal LAD: 90% eccentric ostial, patent proximal stent, 90% eccentric stenosis post stent and septal LCx: 60% ostial and proximal with patent LCX stent prior to OM take-off RCA: patent proximal stent; without significant disease  L subclavian/LIMA: patent and suitable for CABG  LV: EF 60% with minimal focal mid inferior hypokinesis AO: No RAS  Tolerated well  Rec: Surgical consultation for probable CABG.  Lennette Bihari, MD, [facility] 09/07/2012 11:46 AM

## 2012-09-07 NOTE — Progress Notes (Signed)
ANTICOAGULATION CONSULT NOTE - Follow Up Consult  Pharmacy Consult for Heparin Indication: chest pain/ACS  Allergies  Allergen Reactions  . Amoxicillin-Pot Clavulanate     Unknown reaction  . Levofloxacin     Unknown reaction  . Meloxicam     Unknown reaction  . Metaxalone     Unknown reaction  . Sulfonamide Derivatives     unknown reaction    Patient Measurements: Height: 5' 9.5" (176.5 cm) Weight: 192 lb (87.091 kg) IBW/kg (Calculated) : 71.85 Heparin Dosing Weight: 87 kg  Vital Signs: Temp: 97.6 F (36.4 C) (04/23 1228) Temp src: Oral (04/23 1228) BP: 184/67 mmHg (04/23 1200) Pulse Rate: 53 (04/23 1228)  Labs:  Recent Labs  09/05/12 1210 09/05/12 1714 09/05/12 2217  09/06/12 0455 09/06/12 1256 09/07/12 0512  HGB 14.5  --   --   --  13.8  --  12.9*  HCT 40.9  --   --   --  39.3  --  37.0*  PLT 186  --   --   --  173  --  180  LABPROT  --   --   --   --  12.5  --   --   INR  --   --   --   --  0.94  --   --   HEPARINUNFRC  --   --   --   < > 0.43 0.49 0.54  CREATININE 1.00  --   --   --  1.02  --  1.00  TROPONINI  --  <0.30 <0.30  --  <0.30  --   --   < > = values in this interval not displayed.  Estimated Creatinine Clearance: 80.2 ml/min (by C-G formula based on Cr of 1).  Assessment: 67 y.o. M who presented to the [facility] on 09/05/12 with CP. Pt now s/p stress test with abnormal results, cath today shows multivessel disease. Surgical consult pending.  Heparin level (0.5) is at-goal on 1500 units/hr prior to cath this morning. Orders to resume tonight   Goal of Therapy:  Heparin level 0.3-0.7 units/ml Monitor platelets by anticoagulation protocol: Yes   Plan:  1. Restart IV heparin infusion at 1500 units/hr.  2. Daily heparin level and CBC 3. Follow-up surgical plans  Sheppard Coil PharmD., BCPS Clinical Pharmacist Pager [phone] 09/07/2012 1:03 PM

## 2012-09-07 NOTE — Cardiovascular Report (Signed)
Brian Brock, Brian Brock NO.:  [id number]  MEDICAL RECORD NO.:  [id number]  LOCATION:  2917                         FACILITY:  [facility]  PHYSICIAN:  Brian Brock, M.D.     DATE OF BIRTH:  Mar 15, 1946  DATE OF PROCEDURE:  09/07/2012 DATE OF DISCHARGE:                           CARDIAC CATHETERIZATION   INDICATIONS:  Brian Brock is a 67 year old gentleman who has known coronary artery disease and has had prior percutaneous coronary interventions with stenting to his LAD and right coronary artery in 1997, in 1998, and in 2004 to his proximal left circumflex coronary artery.  He also has peripheral vascular disease and is status post stenting to his left SFA in 2004 and right SFA in 2008.  He had been relatively stable.  Unfortunately, there is a history of tobacco use, hyperlipidemia, and the buddy presented to [facility] on this admission on September 05, 2012, with chest pain and T-wave changes.  He was seen by Dr. Allyson Brock.  He subsequently was referred for nuclear perfusion study which demonstrated reversible ischemia in the apical segment of the antral wall with preserved ejection fraction at 66%.  Patient now presents for definitive diagnostic cardiac catheterization.  PROCEDURE:  After premedication with Versed 2 mg plus fentanyl 50 mcg, the patient was prepped and draped in usual fashion.  His right femoral artery was punctured anteriorly and a 5-French sheath was inserted without difficulty.  Diagnostic cardiac catheterization was done utilizing 5-French Judkins 4 left and right coronary catheters.  The right catheter was also used for selective angiography into the left subclavian internal mammary artery system due to high likelihood for CABG revascularization surgery.  A 5-French pigtail catheter was used for RAO ventriculography.  Distal aortography was also performed to further evaluate aortoiliac disease.  At this point, I did break scrub, discussed  options with the patient.  Hemostasis was obtained by direct manual pressure.  The patient tolerated the procedure well.  He is pain free upon leaving the cath lab, but due to his significant coronary anatomy, he will be transported to transitional care unit for closer observation.  HEMODYNAMIC DATA:  Central aortic pressure 130/65.  Left ventricular pressure 130/12.  ANGIOGRAPHIC DATA:  Left main coronary artery had a 30-40% smooth distal taper and bifurcated into LAD and left circumflex system.  The LAD had eccentric 90% very ostial stenosis proximal to the previously placed LAD stent.  The LAD stent was patent.  However, beyond the stent after septal perforating artery, there was 90% eccentric proximal mid LAD stenosis.  The LAD did extend and wrap around the LV apex.  The circumflex vessel did have ostial narrowing of approximately 60% followed by another area of 60% narrowing before the stent.  The stent was patent.  Beyond the stented segment, the vessel gave rise to a moderate-sized obtuse marginal branch which was bifurcated distally and seemed to reach the LV apex.  Selective angiography into the right coronary artery revealed a patent RCA stent.  The RCA supplied a PDA vessel.  There was faint collateralization from the PDA to possible septal vessel.  This was hard to visualize most views.  Selective angiography in the left subclavian system  revealed a patent left subclavian artery and LIMA system suitable for CABG surgery.  RAO ventriculography revealed normal global LV function.  There was a suggestion of very minimal inferior hypocontractility.  Ejection fraction was approximately 60%.  Distal aortography revealed widely patent renal arteries.  There did not appear to be any significant abnormality in the abdominal aorta and the proximal iliac system.  IMPRESSION: 1. Preserved LV function with minimal focal region of mild inferior     hypocontractility. 2.  Native coronary artery disease with smooth 30-40% distal tapering     of the left main, 90% eccentric ostial LAD stenosis proximal to a     previously placed proximal LAD stent, and 90% LAD stenosis after     the stented segment and septal perforating artery; 60% ostial     circumflex stenosis followed by 60% proximal stenosis prior to the     previously placed patent circumflex stents. 3. Patent proximal right coronary artery stent with no significant     obstructive disease and evidence for faint collateralization from     the PDA vessel to the left system. 4. No evidence for renal artery stenosis or significant aortic disease     with patent proximal iliac vessels.  RECOMMENDATIONS:  [name] has a history of longstanding multivessel disease.  He now has evidence for ostial 90% eccentric stenosis extending to the left main takeoff with 60% ostial circumflex narrowing consistent with left main equivalent disease.  Surgical consultation will be obtained for consideration of CABG revascularization surgery. The patient has been on Plavix which may delay timing of surgery.  He will be transported to the transitional care unit.  He will subsequently be heparinized pending CABG surgery.          ______________________________ Brian Brock, M.D.     Brian Brock/MEDQ  D:  09/07/2012  T:  09/07/2012  Job:  [id number]  cc:   Brian Brock, M.D.

## 2012-09-07 NOTE — Plan of Care (Signed)
Problem: Phase II Progression Outcomes Goal: Cath/PCI Day Path if indicated Outcome: Progressing Scheduled 4/23

## 2012-09-07 NOTE — Progress Notes (Signed)
[facility] Progress Note  Subjective:  No further chest pain  Objective:   Vital Signs in the last 24 hours: Temp:  [98 F (36.7 C)-98.6 F (37 C)] 98 F (36.7 C) (04/23 0639) Pulse Rate:  [55-81] 62 (04/23 0639) Resp:  [17-18] 18 (04/23 0639) BP: (113-144)/(56-77) 144/77 mmHg (04/23 0639) SpO2:  [93 %-94 %] 94 % (04/23 0639)  Intake/Output from previous day:    Scheduled: . acidophilus  1 capsule Oral Daily  . aspirin EC  81 mg Oral Daily  . atorvastatin  40 mg Oral q1800  . cholestyramine  1 packet Oral BID WC  . clopidogrel  75 mg Oral Daily  . ezetimibe  10 mg Oral q1800  . losartan  50 mg Oral q1800  . nebivolol  2.5 mg Oral Daily  . pantoprazole  40 mg Oral BID    Physical Exam:   General appearance: alert, cooperative and no distress Neck: no carotid bruit, no JVD and supple, symmetrical, trachea midline Lungs: clear to auscultation bilaterally Heart: regular rate and rhythm Abdomen: soft, non-tender; bowel sounds normal; no masses,  no organomegaly Extremities: no edema, redness or tenderness in the calves or thighs   Rate: 62   Rhythm: normal sinus rhythm  Lab Results:    Recent Labs  09/06/12 0455 09/07/12 0512  NA 138 140  K 3.9 3.8  CL 103 108  CO2 27 26  GLUCOSE 125* 115*  BUN 15 13  CREATININE 1.02 1.00    Recent Labs  09/05/12 2217 09/06/12 0455  TROPONINI <0.30 <0.30   Hepatic Function Panel  Recent Labs  09/05/12 1210  PROT 6.4  ALBUMIN 3.2*  AST 38*  ALT 56*  ALKPHOS 94  BILITOT 0.5    Recent Labs  09/06/12 0455  INR 0.94   BNP (last 3 results) No results found for this basename: PROBNP,  in the last 8760 hours Lipid Panel  Lipid Panel     Component Value Date/Time   CHOL 155 09/06/2012 0455   TRIG 179* 09/06/2012 0455   HDL 52 09/06/2012 0455   CHOLHDL 3.0 09/06/2012 0455   VLDL 36 09/06/2012 0455   LDLCALC 67 09/06/2012 0455      Imaging:  Dg Chest 2 View  09/05/2012   *RADIOLOGY REPORT*  Clinical Data: Chest pain  CHEST - 2 VIEW  Comparison: Chest radiograph November 05, 2008 and chest CT May 21, 2009  Findings:  There is underlying emphysematous change.  There is minimal scarring in the left base.  There is no edema or consolidation.  Heart size and pulmonary vascularity are normal. No adenopathy.  No bone lesions.  IMPRESSION: No edema or consolidation.  There is a degree of underlying emphysematous change.   Original Report Authenticated By: Bretta Bang, M.D.    Nm Myocar Multi W/spect W/wall Motion / Ef  09/06/2012  *RADIOLOGY REPORT*  Clinical Data:  67 year old male with substernal chest pain. History of myocardial infarction and prior coronary artery stenting.  MYOCARDIAL IMAGING WITH SPECT (REST AND PHARMACOLOGIC-STRESS) GATED LEFT VENTRICULAR WALL MOTION STUDY LEFT VENTRICULAR EJECTION FRACTION  Technique:  Resting myocardial SPECT imaging was initially performed after intravenous administration of radiopharmaceutical. Myocardial SPECT was subsequently performed after additional radiopharmaceutical injection during pharmacologic-stress supervised by the Cardiology staff.  Quantitative gated imaging was also performed to evaluate left ventricular wall motion, and estimate left ventricular ejection fraction.  Radiopharmaceutical:  Tc-8m Cardiolite at rest and during stress.  Comparison: none  Findings:  Technique: Study is adequate.  Perfusion: There is a small focus of decreased counts within the apical segment of the anterolateral wall which improves from stress to rest.  No additional evidence of ischemia or infarction.  Wall motion:  No focal wall motion abnormality.  Normal contractility.  Left ventricular ejection fraction:  Calculated left ventricular ejection fraction = 66%.  End-diastolic volume equals 72 ml End-systolic volume equals 25 ml  IMPRESSION:  1. Small region of reversible ischemia within the apical segment of the anterolateral wall.  2.  Normal wall motion. 3.  Left ventricular ejection fraction equal66%  These results will be called to the ordering clinician or representative by the Radiologist Assistant, and communication documented in the PACS Dashboard.   Original Report Authenticated By: Genevive Bi, M.D.       Assessment/Plan:   Principal Problem:   Unstable angina Active Problems:   HYPERLIPIDEMIA   TOBACCO ABUSE   BRONCHITIS, CHRONIC- finnished course of ABs and steroids 4/19   CAD- prior LAD and RCA stenting ('97 and '98) CFX stent '04   Elevated LFTs    EKG abnormality- transient TWI   PVD- Lt SFA stent '04, Rt SFA stent '08  Discussed nuclear findings and plan for cath with possible PCI if necessary with patient and wife. Plan this am.    Lennette Bihari, MD, [facility] 09/07/2012, 8:32 AM

## 2012-09-08 ENCOUNTER — Inpatient Hospital Stay (HOSPITAL_COMMUNITY): Payer: Medicare Other

## 2012-09-08 LAB — HEPARIN LEVEL (UNFRACTIONATED)
Heparin Unfractionated: 0.23 IU/mL — ABNORMAL LOW (ref 0.30–0.70)
Heparin Unfractionated: 0.36 IU/mL (ref 0.30–0.70)

## 2012-09-08 LAB — CBC
HCT: 35.9 % — ABNORMAL LOW (ref 39.0–52.0)
Hemoglobin: 12.3 g/dL — ABNORMAL LOW (ref 13.0–17.0)
MCH: 28.5 pg (ref 26.0–34.0)
MCHC: 34.3 g/dL (ref 30.0–36.0)
MCV: 83.3 fL (ref 78.0–100.0)
Platelets: 168 10*3/uL (ref 150–400)
RBC: 4.31 MIL/uL (ref 4.22–5.81)
RDW: 14.6 % (ref 11.5–15.5)
WBC: 8.6 10*3/uL (ref 4.0–10.5)

## 2012-09-08 MED ORDER — LORATADINE 10 MG PO TABS
10.0000 mg | ORAL_TABLET | Freq: Every day | ORAL | Status: DC
  Administered 2012-09-08 – 2012-09-18 (×10): 10 mg via ORAL
  Filled 2012-09-08 (×11): qty 1

## 2012-09-08 MED ORDER — HEPARIN (PORCINE) IN NACL 100-0.45 UNIT/ML-% IJ SOLN
1500.0000 [IU]/h | INTRAMUSCULAR | Status: DC
  Administered 2012-09-08 – 2012-09-10 (×2): 1700 [IU]/h via INTRAVENOUS
  Administered 2012-09-10 – 2012-09-11 (×2): 1500 [IU]/h via INTRAVENOUS
  Filled 2012-09-08 (×8): qty 250

## 2012-09-08 MED ORDER — ALBUTEROL SULFATE (5 MG/ML) 0.5% IN NEBU
2.5000 mg | INHALATION_SOLUTION | Freq: Once | RESPIRATORY_TRACT | Status: AC
  Administered 2012-09-08: 2.5 mg via RESPIRATORY_TRACT

## 2012-09-08 MED ORDER — LOSARTAN POTASSIUM 50 MG PO TABS
100.0000 mg | ORAL_TABLET | Freq: Every day | ORAL | Status: DC
  Administered 2012-09-08 – 2012-09-11 (×4): 100 mg via ORAL
  Filled 2012-09-08 (×5): qty 2

## 2012-09-08 NOTE — Progress Notes (Signed)
ANTICOAGULATION CONSULT NOTE - Follow Up Consult  Pharmacy Consult for Heparin Indication: chest pain/ACS  Allergies  Allergen Reactions  . Amoxicillin-Pot Clavulanate     Unknown reaction  . Levofloxacin     Unknown reaction  . Meloxicam     Unknown reaction  . Metaxalone     Unknown reaction  . Sulfonamide Derivatives     unknown reaction    Patient Measurements: Height: 5' 9.5" (176.5 cm) Weight: 192 lb (87.091 kg) IBW/kg (Calculated) : Brian.85 Heparin Dosing Weight: 87 kg  Vital Signs: Temp: 97.6 F (36.4 C) (04/24 0405) Temp src: Oral (04/24 0405) BP: 126/65 mmHg (04/24 0405)  Labs:  Recent Labs  09/05/12 1210 09/05/12 1714 09/05/12 2217  09/06/12 0455 09/06/12 1256 09/07/12 0512 09/08/12 0450  HGB 14.5  --   --   --  13.8  --  12.9* 12.3*  HCT 40.9  --   --   --  39.3  --  37.0* 35.9*  PLT 186  --   --   --  173  --  180 168  LABPROT  --   --   --   --  12.5  --   --   --   INR  --   --   --   --  0.94  --   --   --   HEPARINUNFRC  --   --   --   < > 0.43 0.49 0.54 0.23*  CREATININE 1.00  --   --   --  1.02  --  1.00  --   TROPONINI  --  <0.30 <0.30  --  <0.30  --   --   --   < > = values in this interval not displayed.  Estimated Creatinine Clearance: 80.2 ml/min (by C-G formula based on Cr of 1).  Assessment: 67 y.o. Brock who presented to the [facility] on 09/05/12 with CP.  Heparin level (0.23) is below goal on 1500 units/hr.  Was previously therapeutic on this dose.  No issues with infusion per RN  Goal of Therapy:  Heparin level 0.3-0.7 units/ml Monitor platelets by anticoagulation protocol: Yes   Plan:  1.Increase IV heparin infusion to 1600 units/hr.  2. Heparin level in 6 hours 3. Follow-up surgical plans  Talbert Cage PharmD Clinical Pharmacist Pager [phone] 09/08/2012 6:09 AM

## 2012-09-08 NOTE — Progress Notes (Signed)
CARDIAC REHAB PHASE I   PRE:  Rate/Rhythm: 59SB  BP:  Supine:   Sitting: 142/80  Standing:    SaO2:   MODE:  Ambulation: 170 ft   POST:  Rate/Rhythm: 77SR  BP:  Supine:   Sitting: 132/56  Standing:    SaO2: 97%RA 1405-1443 Received preop consult. Pt walked 170 ft with steady gait. Tolerated well. Pt could have walked farther but pt's RN stated that cardiologist only wants short walks. Pt denied CP. Gave pt [facility] booklet and encouraged him to watch preop video with wife before surgery. Gave him information needed to put video on. Pt demonstrated 2500 ml on IS. Encouraged use in preparation for surgery. Discussed importance of sternal precautions and pt demonstrated getting up without using arms. Discussed importance of increasing activity after surgery and pt states he will be ready to walk. Luetta Nutting RN BSN   Luetta Nutting, RN BSN  09/08/2012 2:38 PM

## 2012-09-08 NOTE — Progress Notes (Signed)
PFT completed. Unconfirmed results placed in Progress Notes of [facility]. 

## 2012-09-08 NOTE — Progress Notes (Signed)
[facility]  Subjective: No complaints other than a HA.  Objective: Vital signs in last 24 hours: Temp:  [97.6 F (36.4 C)-98.2 F (36.8 C)] 97.9 F (36.6 C) (04/24 0745) Pulse Rate:  [50-69] 64 (04/23 1600) Resp:  [17-21] 18 (04/23 2355) BP: (126-184)/(59-77) 159/68 mmHg (04/24 0745) SpO2:  [95 %-100 %] 97 % (04/24 0745) Weight:  [87.5 kg (192 lb 14.4 oz)] 87.5 kg (192 lb 14.4 oz) (04/24 0600) Last BM Date: 09/06/12  Intake/Output from previous day: 04/23 0701 - 04/24 0700 In: 1841.2 [P.O.:670; I.V.:1171.2] Out: 1050 [Urine:1050] Intake/Output this shift:    Medications Current Facility-Administered Medications  Medication Dose Route Frequency Provider Last Rate Last Dose  . 0.9 %  sodium chloride infusion   Intravenous Continuous Wilburt Finlay, PA-C 10 mL/hr at 09/07/12 1900    . acetaminophen (TYLENOL) tablet 650 mg  650 mg Oral Q4H PRN Lennette Bihari, MD   650 mg at 09/07/12 2159  . acidophilus (RISAQUAD) capsule 1 capsule  1 capsule Oral Daily Brittainy Simmons, PA-C   1 capsule at 09/07/12 0801  . ALPRAZolam Prudy Feeler) tablet 0.25 mg  0.25 mg Oral TID PRN Abelino Derrick, PA-C      . aspirin EC tablet 81 mg  81 mg Oral Daily Brittainy Simmons, PA-C   81 mg at 09/06/12 2110  . atorvastatin (LIPITOR) tablet 80 mg  80 mg Oral q1800 Lennette Bihari, MD   80 mg at 09/07/12 1829  . cholestyramine (QUESTRAN) packet 1 packet  1 packet Oral BID WC Robbie Lis, PA-C   1 packet at 09/07/12 2159  . ezetimibe (ZETIA) tablet 10 mg  10 mg Oral q1800 Brittainy Simmons, PA-C   10 mg at 09/07/12 2142  . heparin ADULT infusion 100 units/mL (25000 units/250 mL)  1,600 Units/hr Intravenous Continuous Runell Gess, MD 16 mL/hr at 09/08/12 0620 1,600 Units/hr at 09/08/12 0620  . HYDROcodone-acetaminophen (NORCO/VICODIN) 5-325 MG per tablet 1 tablet  1 tablet Oral Q6H PRN Brittainy Simmons, PA-C      . losartan (COZAAR) tablet 50 mg  50 mg Oral q1800 Eda Paschal  Kilroy, PA-C   50 mg at 09/07/12 2100  . nebivolol (BYSTOLIC) tablet 2.5 mg  2.5 mg Oral Daily Brittainy Simmons, PA-C   2.5 mg at 09/06/12 1206  . nitroGLYCERIN (NITROSTAT) SL tablet 0.4 mg  0.4 mg Sublingual Q5 Min x 3 PRN Brittainy Simmons, PA-C      . ondansetron (ZOFRAN) injection 4 mg  4 mg Intravenous Q6H PRN Lennette Bihari, MD      . pantoprazole (PROTONIX) EC tablet 40 mg  40 mg Oral BID Brittainy Simmons, PA-C   40 mg at 09/07/12 2141  . zolpidem (AMBIEN) tablet 5 mg  5 mg Oral QHS PRN,MR X 1 Brittainy Simmons, PA-C        PE: General appearance: alert, cooperative and no distress Lungs: clear to auscultation bilaterally Heart: regular rate and rhythm, S1, S2 normal, no murmur, click, rub or gallop Extremities: No LEE Pulses: 2+ and symmetric Skin: Right groin:  Moderate size area on ecchymosis. No hematoma and nontender. Neurologic: Grossly normal  Lab Results:   Recent Labs  09/06/12 0455 09/07/12 0512 09/08/12 0450  WBC 12.2* 10.1 8.6  HGB 13.8 12.9* 12.3*  HCT 39.3 37.0* 35.9*  PLT 173 180 168   BMET  Recent Labs  09/05/12 1210 09/06/12 0455 09/07/12 0512  NA 136 138 140  K 3.8 3.9 3.8  CL 101 103 108  CO2 26 27 26   GLUCOSE 126* 125* 115*  BUN 14 15 13   CREATININE 1.00 1.02 1.00  CALCIUM 9.2 8.6 8.6   PT/INR  Recent Labs  09/06/12 0455  LABPROT 12.5  INR 0.94   Cholesterol  Recent Labs  09/06/12 0455  CHOL 155   Lipid Panel     Component Value Date/Time   CHOL 155 09/06/2012 0455   TRIG 179* 09/06/2012 0455   HDL 52 09/06/2012 0455   CHOLHDL 3.0 09/06/2012 0455   VLDL 36 09/06/2012 0455   LDLCALC 67 09/06/2012 0455   Cardiac Panel (last 3 results)  Recent Labs  09/05/12 1714 09/05/12 2217 09/06/12 0455  TROPONINI <0.30 <0.30 <0.30   Left heart cath Full note dictated; see diagram in chart  DICTATION # [id number], [id number]  AO: 130/65  LV: 130/12  LM: 30 - 40% smooth distal  LAD: 90% eccentric ostial, patent proximal stent, 90%  eccentric stenosis post stent and septal  LCx: 60% ostial and proximal with patent LCX stent prior to OM take-off  RCA: patent proximal stent; without significant disease  L subclavian/LIMA: patent and suitable for CABG  LV: EF 60% with minimal focal mid inferior hypokinesis  AO: No RAS  Tolerated well  Rec: Surgical consultation for probable CABG.  Lennette Bihari, MD, [facility]  09/07/2012  11:46 AM  Assessment/Plan  Principal Problem:   Unstable angina Active Problems:   HYPERLIPIDEMIA   TOBACCO ABUSE   BRONCHITIS, CHRONIC- finnished course of ABs and steroids 4/19   CAD- prior LAD and RCA stenting ('97 and '98) CFX stent '04   Elevated LFTs    EKG abnormality- transient TWI   PVD- Lt SFA stent '04, Rt SFA stent '08  Plan:    S/P Left heart cath by Dr. Tresa Endo revealing LM: 30 - 40% smooth distal  LAD: 90% eccentric ostial, patent proximal stent, 90% eccentric stenosis post stent and septal  LCx: 60% ostial and proximal with patent LCX stent prior to OM take-off  RCA: patent proximal stent; without significant disease. EF preserved at 60%  Surgical consult completed and he is scheduled for CABG Monday, 4/28.   Plavix stopped.  Current meds: ASA, lipitor, cozaar, bystolic.    LOS: 3 days    HAGER, BRYAN 09/08/2012 8:31 AM  I have seen and evaluated the patient this AM along with Wilburt Finlay, PA. I agree with his findings, examination as well as impression recommendations.  Cath with severe ostial & proximal LAD stenosis -- plan is Plavix "washout" & CABG on Monday.  No further CP or SOB -- has strolled in hallway.    BP is elevated -- will increase ARB dose.  On low dose Bystolic, but has had some bradycardia in low 50s, so will hold off on increasing dose for now.  On Statin + Zetia.  Undergoing pre-CABG evaluation.  Will keep in TCU/SDU today, but may consider transfer to [facility] over the weekend if he remains relatively stable.  ON IV Heparin (full anticoagulation - so no  additional VTE prophylaxis needed) On PPI for GI prophylaxis.  Marykay Lex, M.D., M.S. [facility] [address] [place], [place]  [address]  [phone] Pager # [phone] 09/08/2012 8:57 AM

## 2012-09-08 NOTE — Progress Notes (Signed)
Pre-op Cardiac Surgery  Carotid Findings:  No ICA stenosis.  Vertebral artery flow is antegrade.  Upper Extremity Right Left  Brachial Pressures    Radial Waveforms    Ulnar Waveforms    Palmar Arch (Allen's Test)     Findings:      Lower  Extremity Right Left  Dorsalis Pedis    Anterior Tibial    Posterior Tibial    Ankle/Brachial Indices      Findings:

## 2012-09-08 NOTE — Progress Notes (Signed)
ANTICOAGULATION CONSULT NOTE - Follow Up Consult  Pharmacy Consult for Heparin Indication: chest pain/ACS  Allergies  Allergen Reactions  . Amoxicillin-Pot Clavulanate     Unknown reaction  . Levofloxacin     Unknown reaction  . Meloxicam     Unknown reaction  . Metaxalone     Unknown reaction  . Sulfonamide Derivatives     unknown reaction    Patient Measurements: Height: 5' 9.5" (176.5 cm) Weight: 192 lb 14.4 oz (87.5 kg) IBW/kg (Calculated) : 71.85 Heparin Dosing Weight: 87 kg  Vital Signs: Temp: 97.5 F (36.4 C) (04/24 1145) Temp src: Oral (04/24 1145) BP: 138/66 mmHg (04/24 1145)  Labs:  Recent Labs  09/05/12 1714 09/05/12 2217  09/06/12 0455  09/07/12 0512 09/08/12 0450 09/08/12 1150  HGB  --   --   < > 13.8  --  12.9* 12.3*  --   HCT  --   --   --  39.3  --  37.0* 35.9*  --   PLT  --   --   --  173  --  180 168  --   LABPROT  --   --   --  12.5  --   --   --   --   INR  --   --   --  0.94  --   --   --   --   HEPARINUNFRC  --   --   < > 0.43  < > 0.54 0.23* 0.36  CREATININE  --   --   --  1.02  --  1.00  --   --   TROPONINI <0.30 <0.30  --  <0.30  --   --   --   --   < > = values in this interval not displayed.  Estimated Creatinine Clearance: 80.3 ml/min (by C-G formula based on Cr of 1).  Assessment: 67 y.o. M who presented to the [facility] on 09/05/12 with CP.  Heparin level (0.3) is at goal on 1600 units/hr.  No issues with infusion per RN, no bleeding has been noted.  Noted plans for plavix washout over weekend and CABG 4/28.  Goal of Therapy:  Heparin level 0.3-0.7 units/ml Monitor platelets by anticoagulation protocol: Yes   Plan:  1.Increase IV heparin infusion to 1700 units/hr.  2. Daily HL  Sheppard Coil PharmD., BCPS Clinical Pharmacist Pager [phone] 09/08/2012 12:45 PM

## 2012-09-09 LAB — CBC
HCT: 36.9 % — ABNORMAL LOW (ref 39.0–52.0)
Hemoglobin: 12.7 g/dL — ABNORMAL LOW (ref 13.0–17.0)
MCH: 28.7 pg (ref 26.0–34.0)
MCHC: 34.4 g/dL (ref 30.0–36.0)
MCV: 83.3 fL (ref 78.0–100.0)
Platelets: 179 10*3/uL (ref 150–400)
RBC: 4.43 MIL/uL (ref 4.22–5.81)
RDW: 14.5 % (ref 11.5–15.5)
WBC: 8.7 10*3/uL (ref 4.0–10.5)

## 2012-09-09 LAB — HEPARIN LEVEL (UNFRACTIONATED): Heparin Unfractionated: 0.58 IU/mL (ref 0.30–0.70)

## 2012-09-09 MED ORDER — ALPRAZOLAM 0.25 MG PO TABS: 0.2500 mg | ORAL_TABLET | ORAL | Status: DC | PRN

## 2012-09-09 NOTE — Progress Notes (Addendum)
No complaints  BP 131/55  Pulse 55  Temp(Src) 98 F (36.7 C) (Oral)  Resp 18  Ht 5' 9.5" (1.765 m)  Wt 192 lb 14.4 oz (87.5 kg)  BMI 28.09 kg/m2  SpO2 100%   Intake/Output Summary (Last 24 hours) at 09/09/12 1632 Last data filed at 09/09/12 1500  Gross per 24 hour  Intake   1221 ml  Output      0 ml  Net   1221 ml    Exam unchanged  Carotids normal   For CABG Monday 4/28, possibly off pump.   All questions answered  Preop orders written

## 2012-09-09 NOTE — Progress Notes (Signed)
[facility]  Subjective: Feels fine. Denies CP/SOB.  Objective: Vital signs in last 24 hours: Temp:  [97.5 F (36.4 C)-98.2 F (36.8 C)] 97.7 F (36.5 C) (04/25 0725) Pulse Rate:  [55] 55 (04/25 0202) Resp:  [16-20] 20 (04/24 1600) BP: (100-138)/(60-68) 122/62 mmHg (04/25 0202) SpO2:  [95 %-98 %] 95 % (04/25 0725) Last BM Date: 09/06/12  Intake/Output from previous day: 04/24 0701 - 04/25 0700 In: 855 [P.O.:240; I.V.:615] Out: -  Intake/Output this shift:    Medications Current Facility-Administered Medications  Medication Dose Route Frequency Provider Last Rate Last Dose  . 0.9 %  sodium chloride infusion   Intravenous Continuous Wilburt Finlay, PA-C 10 mL/hr at 09/09/12 0600    . acetaminophen (TYLENOL) tablet 650 mg  650 mg Oral Q4H PRN Lennette Bihari, MD   650 mg at 09/08/12 1643  . acidophilus (RISAQUAD) capsule 1 capsule  1 capsule Oral Daily Brittainy Simmons, PA-C   1 capsule at 09/07/12 0801  . ALPRAZolam Prudy Feeler) tablet 0.25 mg  0.25 mg Oral TID PRN Abelino Derrick, PA-C      . aspirin EC tablet 81 mg  81 mg Oral Daily Brittainy Simmons, PA-C   81 mg at 09/08/12 0900  . atorvastatin (LIPITOR) tablet 80 mg  80 mg Oral q1800 Lennette Bihari, MD   80 mg at 09/08/12 1700  . cholestyramine (QUESTRAN) packet 1 packet  1 packet Oral BID WC Brittainy Simmons, PA-C   1 packet at 09/08/12 2214  . ezetimibe (ZETIA) tablet 10 mg  10 mg Oral q1800 Brittainy Simmons, PA-C   10 mg at 09/08/12 1700  . heparin ADULT infusion 100 units/mL (25000 units/250 mL)  1,700 Units/hr Intravenous Continuous Severiano Gilbert, RPH 17 mL/hr at 09/09/12 0600 1,700 Units/hr at 09/09/12 0600  . HYDROcodone-acetaminophen (NORCO/VICODIN) 5-325 MG per tablet 1 tablet  1 tablet Oral Q6H PRN Robbie Lis, PA-C   1 tablet at 09/08/12 2213  . loratadine (CLARITIN) tablet 10 mg  10 mg Oral Daily Wilburt Finlay, PA-C   10 mg at 09/08/12 1134  . losartan (COZAAR) tablet 100 mg  100 mg  Oral q1800 Wilburt Finlay, PA-C   100 mg at 09/08/12 1700  . nebivolol (BYSTOLIC) tablet 2.5 mg  2.5 mg Oral Daily Brittainy Simmons, PA-C   2.5 mg at 09/08/12 0900  . nitroGLYCERIN (NITROSTAT) SL tablet 0.4 mg  0.4 mg Sublingual Q5 Min x 3 PRN Brittainy Simmons, PA-C      . ondansetron (ZOFRAN) injection 4 mg  4 mg Intravenous Q6H PRN Lennette Bihari, MD      . pantoprazole (PROTONIX) EC tablet 40 mg  40 mg Oral BID Brittainy Simmons, PA-C   40 mg at 09/08/12 2213  . zolpidem (AMBIEN) tablet 5 mg  5 mg Oral QHS PRN,MR X 1 Brittainy Simmons, PA-C        PE: General appearance: alert, cooperative and no distress Lungs: clear to auscultation bilaterally Heart: regular rate and rhythm Extremities: no LEE Pulses: 2+ and symmetric Skin: warm and dry Neurologic: Grossly normal  Lab Results:   Recent Labs  09/07/12 0512 09/08/12 0450 09/09/12 0415  WBC 10.1 8.6 8.7  HGB 12.9* 12.3* 12.7*  HCT 37.0* 35.9* 36.9*  PLT 180 168 179   BMET  Recent Labs  09/07/12 0512  NA 140  K 3.8  CL 108  CO2 26  GLUCOSE 115*  BUN 13  CREATININE 1.00  CALCIUM 8.6  Assessment/Plan  Principal Problem:   Unstable angina Active Problems:   CAD- prior LAD and RCA stenting ('97 and '98) CFX stent '04   HYPERLIPIDEMIA   TOBACCO ABUSE   BRONCHITIS, CHRONIC- finnished course of ABs and steroids 4/19   Elevated LFTs    EKG abnormality- transient TWI   PVD- Lt SFA stent '04, Rt SFA stent '08  Plan: Admitted with unstable angina. NST was abnormal. Subsequent LHC revealed severe ostial and proximal LAD stenosis. CABG is tentatively planned for Monday, 4/28. He is CP free. Continue Plavix washout. Keep on IV heparin. HR and BP both stable. NSR on telemetry. Will continue to monitor over the weekend.     LOS: 4 days    Brittainy M. Delmer Islam 09/09/2012 7:45 AM  I have seen and evaluated the patient this AM along with Boyce Medici, PA. I agree with her findings, examination as well as  impression recommendations.  Remains relatively stable with Multi-Vessel CAD - tolerated short walks. Continue current med Rx in addition to IV Heparin.    I think he is probably stable enough to move to Telemetry.    Marykay Lex, M.D., M.S. [facility] [address] [place], [place]  [address]  [phone] Pager # [phone] 09/09/2012 8:47 AM

## 2012-09-09 NOTE — Progress Notes (Signed)
Report called to receiving Rn [id number]. Will transfer via WC/monitor.

## 2012-09-09 NOTE — Progress Notes (Signed)
ANTICOAGULATION CONSULT NOTE - Follow Up Consult  Pharmacy Consult for Heparin Indication: chest pain/ACS-awaiting CABG  Allergies  Allergen Reactions  . Amoxicillin-Pot Clavulanate     Unknown reaction  . Levofloxacin     Unknown reaction  . Meloxicam     Unknown reaction  . Metaxalone     Unknown reaction  . Sulfonamide Derivatives     unknown reaction    Patient Measurements: Height: 5' 9.5" (176.5 cm) Weight: 192 lb 14.4 oz (87.5 kg) IBW/kg (Calculated) : 71.85 Heparin Dosing Weight: 87 kg  Vital Signs: Temp: 97.7 F (36.5 C) (04/25 0725) Temp src: Oral (04/25 0725) BP: 135/67 mmHg (04/25 0727) Pulse Rate: 55 (04/25 0202)  Labs:  Recent Labs  09/07/12 0512 09/08/12 0450 09/08/12 1150 09/09/12 0415  HGB 12.9* 12.3*  --  12.7*  HCT 37.0* 35.9*  --  36.9*  PLT 180 168  --  179  HEPARINUNFRC 0.54 0.23* 0.36 0.58  CREATININE 1.00  --   --   --     Estimated Creatinine Clearance: 80.3 ml/min (by C-G formula based on Cr of 1).  Assessment: 67 y.o. M who presented to the [facility] on 09/05/12 with CP.  Heparin level (0.5) is at goal on 1700 units/hr.  No issues with infusion per RN, no bleeding has been noted.  Noted plans for plavix washout over weekend and CABG 4/28.  Goal of Therapy:  Heparin level 0.3-0.7 units/ml Monitor platelets by anticoagulation protocol: Yes   Plan:  1.Continue IV heparin infusion to 1700 units/hr.  2. Daily HL  Sheppard Coil PharmD., BCPS Clinical Pharmacist Pager [phone] 09/09/2012 9:28 AM

## 2012-09-09 NOTE — Progress Notes (Signed)
1430 Pt observed walking independently. Since ed completed we will see after surgery.Brian Brock DunlapRNBSN

## 2012-09-10 LAB — BASIC METABOLIC PANEL
BUN: 12 mg/dL (ref 6–23)
CO2: 23 mEq/L (ref 19–32)
Calcium: 9.4 mg/dL (ref 8.4–10.5)
Chloride: 104 mEq/L (ref 96–112)
Creatinine, Ser: 1.03 mg/dL (ref 0.50–1.35)
GFR calc Af Amer: 85 mL/min — ABNORMAL LOW (ref >90)
GFR calc non Af Amer: 74 mL/min — ABNORMAL LOW (ref >90)
Glucose, Bld: 103 mg/dL — ABNORMAL HIGH (ref 70–99)
Potassium: 4.1 mEq/L (ref 3.5–5.1)
Sodium: 138 mEq/L (ref 135–145)

## 2012-09-10 LAB — CBC
HCT: 36.6 % — ABNORMAL LOW (ref 39.0–52.0)
HCT: 39 % (ref 39.0–52.0)
Hemoglobin: 12.6 g/dL — ABNORMAL LOW (ref 13.0–17.0)
Hemoglobin: 13.7 g/dL (ref 13.0–17.0)
MCH: 28.7 pg (ref 26.0–34.0)
MCH: 29.3 pg (ref 26.0–34.0)
MCHC: 34.4 g/dL (ref 30.0–36.0)
MCHC: 35.1 g/dL (ref 30.0–36.0)
MCV: 83.4 fL (ref 78.0–100.0)
MCV: 83.3 fL (ref 78.0–100.0)
Platelets: 162 10*3/uL (ref 150–400)
Platelets: 188 10*3/uL (ref 150–400)
RBC: 4.39 MIL/uL (ref 4.22–5.81)
RBC: 4.68 MIL/uL (ref 4.22–5.81)
RDW: 14.5 % (ref 11.5–15.5)
RDW: 14.3 % (ref 11.5–15.5)
WBC: 9.5 10*3/uL (ref 4.0–10.5)
WBC: 9.7 10*3/uL (ref 4.0–10.5)

## 2012-09-10 LAB — TYPE AND SCREEN
ABO/RH(D): B POS
Antibody Screen: NEGATIVE

## 2012-09-10 LAB — HEPARIN LEVEL (UNFRACTIONATED)
Heparin Unfractionated: 0.76 IU/mL — ABNORMAL HIGH (ref 0.30–0.70)
Heparin Unfractionated: 0.59 IU/mL (ref 0.30–0.70)

## 2012-09-10 LAB — ABO/RH: ABO/RH(D): B POS

## 2012-09-10 MED ORDER — BISACODYL 5 MG PO TBEC
5.0000 mg | DELAYED_RELEASE_TABLET | Freq: Once | ORAL | Status: AC
  Administered 2012-09-11: 5 mg via ORAL
  Filled 2012-09-10: qty 1

## 2012-09-10 NOTE — Progress Notes (Signed)
Pre-op Cardiac Surgery  Carotid Findings:  No ICA stenosis.  Vertebral artery flow is antegrade.  Upper Extremity Right Left  Brachial Pressures 111 Triphasic 114 Triphasic  Radial Waveforms Triphasic Triphasic  Ulnar Waveforms Triphasic Triphasic  Palmar Arch (Allen's Test) Abnormal Normal    Findings:  Doppler waveforms on the right diminish 50% with radial compression and remain normal with ulnar compression  Lower  Extremity Right Left  Dorsalis Pedis 123 Triphasic 123 Triphasic      Posterior Tibial 118 Triphasic 127 Biphasic  Ankle/Brachial Indices 1.01 1.1   Findings:  ABIs and Doppler waveforms are within normal limits bilaterally at rest.

## 2012-09-10 NOTE — Progress Notes (Signed)
Subjective:  Continues to be stable with no further angina.   Tolerating short walks.  Objective:  Vital Signs in the last 24 hours: Temp:  [97.7 F (36.5 C)-98.2 F (36.8 C)] 97.7 F (36.5 C) (04/26 0509) Pulse Rate:  [62] 62 (04/26 0509) Resp:  [18] 18 (04/26 0509) BP: (108-131)/(55-71) 108/65 mmHg (04/26 0509) SpO2:  [94 %-100 %] 94 % (04/26 0509)  Intake/Output from previous day: 04/25 0701 - 04/26 0700 In: 1056 [P.O.:840; I.V.:216] Out: -  Intake/Output from this shift: Total I/O In: 360 [P.O.:360] Out: -   Physical Exam: General appearance: alert, cooperative, appears stated age and no distress Neck: no adenopathy, no carotid bruit and no JVD Lungs: clear to auscultation bilaterally, normal percussion bilaterally and non-labored Heart: regular rate and rhythm, S1, S2 normal, no murmur, click, rub or gallop Abdomen: soft, non-tender; bowel sounds normal; no masses,  no organomegaly Extremities: extremities normal, atraumatic, no cyanosis or edema Pulses: 2+ and symmetric Neurologic: Alert and oriented X 3, normal strength and tone. Normal symmetric reflexes. Normal coordination and gait  Lab Results:  Recent Labs  09/09/12 0415 09/10/12 0545  WBC 8.7 9.5  HGB 12.7* 12.6*  PLT 179 162    Cardiac Studies: Cardiac Cath: LM: 30 - 40% smooth distal; LAD: 90% eccentric ostial, patent proximal stent, 90% eccentric stenosis post stent and septal; LCx: 60% ostial and proximal with patent LCX stent prior to OM take-off.  RCA: patent proximal stent; without significant disease;; L subclavian/LIMA: patent and suitable for CABG. LV: EF 60% with minimal focal mid inferior hypokinesis  Assessment/Plan:  Principal Problem:   Unstable angina Active Problems:   CAD (coronary artery disease), native coronary artery - Severe Ostial & Proximal LAD ~90%; Ostial Circumflex ostial ~60%; Plan CABG   HYPERLIPIDEMIA   TOBACCO ABUSE   CAD- prior LAD and RCA stenting ('97 and '98) CFX  stent '04   PVD- Lt SFA stent '04, Rt SFA stent '08   BRONCHITIS, CHRONIC- finnished course of ABs and steroids 4/19   Elevated LFTs    EKG abnormality- transient TWI  Continues to be stable -- BP & HR stable on BB & ACE (mild bradycardia to high 40s o/n -- no room to titrate Bystolic).  No further Angina; PRN NTG  On statin & Zetia & Questran for HLD.  Continuing IV Heparin due to severity of LAD disease.   On PPI for GI prophylaxis.    LOS: 5 days  15 min with patient  Brian Brock W 09/10/2012, 9:46 AM

## 2012-09-10 NOTE — Progress Notes (Signed)
6:53 AM    Heparin level 0.76 no bleeding noted. Decreasing heparin to 1500 units/hr with 6 hour recheck HL in 6 hours.  Janice Coffin

## 2012-09-10 NOTE — Progress Notes (Signed)
ANTICOAGULATION CONSULT NOTE - Follow Up Consult  Pharmacy Consult for Heparin Indication: chest pain/ACS-awaiting CABG  Allergies  Allergen Reactions  . Amoxicillin-Pot Clavulanate     Unknown reaction  . Levofloxacin     Unknown reaction  . Meloxicam     Unknown reaction  . Metaxalone     Unknown reaction  . Sulfonamide Derivatives     unknown reaction    Patient Measurements: Height: 5' 9.5" (176.5 cm) Weight: 192 lb 14.4 oz (87.5 kg) IBW/kg (Calculated) : 71.85 Heparin Dosing Weight: 87 kg  Vital Signs: Temp: 97.7 F (36.5 C) (04/26 0509) Temp src: Oral (04/26 0509) BP: 108/65 mmHg (04/26 0509) Pulse Rate: 62 (04/26 0509)  Labs:  Recent Labs  09/09/12 0415 09/10/12 0545 09/10/12 0942 09/10/12 1305  HGB 12.7* 12.6* 13.7  --   HCT 36.9* 36.6* 39.0  --   PLT 179 162 188  --   HEPARINUNFRC 0.58 0.76*  --  0.59  CREATININE  --   --  1.03  --     Estimated Creatinine Clearance: 77.9 ml/min (by C-G formula based on Cr of 1.03).  Assessment: 67 y.o. M who presented to the [facility] on 09/05/12 with CP.  Heparin level (0.59) is at goal on 1500 units/hr.    Noted plans for plavix washout over weekend and CABG 4/28.  Goal of Therapy:  Heparin level 0.3-0.7 units/ml Monitor platelets by anticoagulation protocol: Yes   Plan:  1.Continue IV heparin infusion to 1500 units/hr.  2. Daily heparin level and CBC  Zondra Lawlor, Pharm.D., BCPS Clinical Pharmacist Pager [phone] 09/10/2012 2:06 PM

## 2012-09-11 LAB — URINALYSIS, ROUTINE W REFLEX MICROSCOPIC
Bilirubin Urine: NEGATIVE
Glucose, UA: NEGATIVE mg/dL
Hgb urine dipstick: NEGATIVE
Ketones, ur: NEGATIVE mg/dL
Leukocytes, UA: NEGATIVE
Nitrite: NEGATIVE
Protein, ur: NEGATIVE mg/dL
Specific Gravity, Urine: 1.013 (ref 1.005–1.030)
Urobilinogen, UA: 0.2 mg/dL (ref 0.0–1.0)
pH: 5.5 (ref 5.0–8.0)

## 2012-09-11 LAB — BLOOD GAS, ARTERIAL
Acid-Base Excess: 1.5 mmol/L (ref 0.0–2.0)
Bicarbonate: 25.5 mEq/L — ABNORMAL HIGH (ref 20.0–24.0)
Drawn by: 36274
FIO2: 0.21 %
O2 Saturation: 97.1 %
Patient temperature: 98.6
TCO2: 26.7 mmol/L (ref 0–100)
pCO2 arterial: 39.8 mmHg (ref 35.0–45.0)
pH, Arterial: 7.422 (ref 7.350–7.450)
pO2, Arterial: 72.4 mmHg — ABNORMAL LOW (ref 80.0–100.0)

## 2012-09-11 LAB — CBC
HCT: 35.3 % — ABNORMAL LOW (ref 39.0–52.0)
Hemoglobin: 12.3 g/dL — ABNORMAL LOW (ref 13.0–17.0)
MCH: 28.8 pg (ref 26.0–34.0)
MCHC: 34.8 g/dL (ref 30.0–36.0)
MCV: 82.7 fL (ref 78.0–100.0)
Platelets: 167 10*3/uL (ref 150–400)
RBC: 4.27 MIL/uL (ref 4.22–5.81)
RDW: 14.8 % (ref 11.5–15.5)
WBC: 8.3 10*3/uL (ref 4.0–10.5)

## 2012-09-11 LAB — HEPARIN LEVEL (UNFRACTIONATED): Heparin Unfractionated: 0.53 IU/mL (ref 0.30–0.70)

## 2012-09-11 MED ORDER — LEVOFLOXACIN IN D5W 500 MG/100ML IV SOLN: 500.0000 mg | INTRAVENOUS | Status: DC

## 2012-09-11 MED ORDER — CHLORHEXIDINE GLUCONATE 4 % EX LIQD
60.0000 mL | Freq: Once | CUTANEOUS | Status: AC
Start: 2012-09-12 — End: 2012-09-12
  Administered 2012-09-12: 4 via TOPICAL
  Filled 2012-09-11: qty 60

## 2012-09-11 MED ORDER — VANCOMYCIN HCL 10 G IV SOLR
1500.0000 mg | INTRAVENOUS | Status: DC
  Administered 2012-09-12: 1500 mg via INTRAVENOUS
  Filled 2012-09-11 (×2): qty 1500

## 2012-09-11 MED ORDER — NITROGLYCERIN IN D5W 200-5 MCG/ML-% IV SOLN
2.0000 ug/min | INTRAVENOUS | Status: AC
  Administered 2012-09-12: 5 ug/min via INTRAVENOUS
  Filled 2012-09-11: qty 250

## 2012-09-11 MED ORDER — CHLORHEXIDINE GLUCONATE 4 % EX LIQD
60.0000 mL | Freq: Once | CUTANEOUS | Status: AC
  Administered 2012-09-11: 4 via TOPICAL
  Filled 2012-09-11: qty 60

## 2012-09-11 MED ORDER — DEXTROSE 5 % IV SOLN
750.0000 mg | INTRAVENOUS | Status: DC
  Filled 2012-09-11: qty 750

## 2012-09-11 MED ORDER — MAGNESIUM SULFATE 50 % IJ SOLN
40.0000 meq | INTRAMUSCULAR | Status: DC
  Filled 2012-09-11: qty 10

## 2012-09-11 MED ORDER — PHENYLEPHRINE HCL 10 MG/ML IJ SOLN
30.0000 ug/min | INTRAVENOUS | Status: AC
  Administered 2012-09-12: 25 ug/min via INTRAVENOUS
  Filled 2012-09-11: qty 2

## 2012-09-11 MED ORDER — TEMAZEPAM 15 MG PO CAPS: 15.0000 mg | ORAL_CAPSULE | Freq: Once | ORAL | Status: AC | PRN

## 2012-09-11 MED ORDER — DOPAMINE-DEXTROSE 3.2-5 MG/ML-% IV SOLN
2.0000 ug/kg/min | INTRAVENOUS | Status: DC
  Filled 2012-09-11: qty 250

## 2012-09-11 MED ORDER — DEXMEDETOMIDINE HCL IN NACL 400 MCG/100ML IV SOLN
0.1000 ug/kg/h | INTRAVENOUS | Status: AC
  Administered 2012-09-12: 0.2 ug/kg/h via INTRAVENOUS
  Filled 2012-09-11: qty 100

## 2012-09-11 MED ORDER — SODIUM CHLORIDE 0.9 % IV SOLN
INTRAVENOUS | Status: AC
  Administered 2012-09-12: 69.8 mL/h via INTRAVENOUS
  Filled 2012-09-11: qty 40

## 2012-09-11 MED ORDER — EPINEPHRINE HCL 1 MG/ML IJ SOLN
0.5000 ug/min | INTRAVENOUS | Status: DC
  Filled 2012-09-11: qty 4

## 2012-09-11 MED ORDER — DEXTROSE 5 % IV SOLN
1.5000 g | INTRAVENOUS | Status: AC
  Administered 2012-09-12: 1.5 g via INTRAVENOUS
  Administered 2012-09-12: .75 g via INTRAVENOUS
  Filled 2012-09-11: qty 1.5

## 2012-09-11 MED ORDER — POTASSIUM CHLORIDE 2 MEQ/ML IV SOLN
80.0000 meq | INTRAVENOUS | Status: DC
  Filled 2012-09-11: qty 40

## 2012-09-11 MED ORDER — DIAZEPAM 5 MG PO TABS: 5.0000 mg | ORAL_TABLET | Freq: Once | ORAL | Status: DC

## 2012-09-11 MED ORDER — SODIUM CHLORIDE 0.9 % IV SOLN
INTRAVENOUS | Status: AC
  Administered 2012-09-12: 1 [IU]/h via INTRAVENOUS
  Filled 2012-09-11: qty 1

## 2012-09-11 MED ORDER — PLASMA-LYTE 148 IV SOLN
INTRAVENOUS | Status: AC
  Administered 2012-09-12: 10:00:00
  Filled 2012-09-11: qty 2.5

## 2012-09-11 MED ORDER — METOPROLOL TARTRATE 12.5 MG HALF TABLET
12.5000 mg | ORAL_TABLET | Freq: Once | ORAL | Status: AC
  Administered 2012-09-12: 12.5 mg via ORAL
  Filled 2012-09-11: qty 1

## 2012-09-11 NOTE — Progress Notes (Signed)
Patient ID: Brian Brock, male   DOB: [birth year], 67 y.o.   MRN: [id number] Subjective:  Continues to be stable with no further angina.   Tolerating short walks.  Enjoyed a bath today.    Objective:  Vital Signs in the last 24 hours: Temp:  [97.8 F (36.6 C)-98.6 F (37 C)] 97.8 F (36.6 C) (04/27 0500) Pulse Rate:  [65-67] 65 (04/27 0935) Resp:  [16-20] 20 (04/27 0500) BP: (119-125)/(61-67) 124/66 mmHg (04/27 0935) SpO2:  [97 %-98 %] 98 % (04/27 0500)  Intake/Output from previous day: 04/26 0701 - 04/27 0700 In: 1120 [P.O.:1120] Out: -  Intake/Output from this shift: Total I/O In: 720 [P.O.:720] Out: -   Physical Exam: General appearance: alert, cooperative, appears stated age and no distress Neck: no adenopathy, no carotid bruit and no JVD Lungs: CATB, normal percussion bilaterally and non-labored Heart: regular rate and rhythm, S1, S2 normal, no M</R/G Abdomen: soft, NT/ND/NABS; no HSM Extremities: extremities normal, atraumatic, no cyanosis or edema Pulses: 2+ and symmetric Neurologic: Alert and oriented X 3,  Lab Results:  Recent Labs  09/10/12 0942 09/11/12 0515  WBC 9.7 8.3  HGB 13.7 12.3*  PLT 188 167    Cardiac Studies: Cardiac Cath: LM: 30 - 40% smooth distal; LAD: 90% eccentric ostial, patent proximal stent, 90% eccentric stenosis post stent and septal; LCx: 60% ostial and proximal with patent LCX stent prior to OM take-off.  RCA: patent proximal stent; without significant disease;; L subclavian/LIMA: patent and suitable for CABG. LV: EF 60% with minimal focal mid inferior hypokinesis  Assessment/Plan:  Principal Problem:   Unstable angina Active Problems:   CAD (coronary artery disease), native coronary artery - Severe Ostial & Proximal LAD ~90%; Ostial Circumflex ostial ~60%; Plan CABG   HYPERLIPIDEMIA   TOBACCO ABUSE   CAD- prior LAD and RCA stenting ('97 and '98) CFX stent '04   PVD- Lt SFA stent '04, Rt SFA stent '08   BRONCHITIS, CHRONIC-  finnished course of ABs and steroids 4/19   Elevated LFTs    EKG abnormality- transient TWI  Remains stable -- No recurrent angina or dyspnea, tolerating wals, BP & HR stable on BB & ACE (mild bradycardia to high 40s o/n -- no room to titrate Bystolic).  ; PRN NTG  On statin & Zetia & Questran for HLD.  Continuing IV Heparin due to severity of LAD disease.   On PPI for GI prophylaxis.  Plan for CABG in AM    LOS: 6 days  10 min with patient  Anna-Marie Coller W 09/11/2012, 9:49 AM

## 2012-09-11 NOTE — Progress Notes (Signed)
ANTICOAGULATION CONSULT NOTE - Follow Up Consult  Pharmacy Consult for Heparin Indication: chest pain/ACS-awaiting CABG  Allergies  Allergen Reactions  . Amoxicillin-Pot Clavulanate     Unknown reaction  . Levofloxacin     Unknown reaction  . Meloxicam     Unknown reaction  . Metaxalone     Unknown reaction  . Sulfonamide Derivatives     unknown reaction    Patient Measurements: Height: 5' 9.5" (176.5 cm) Weight: 192 lb 14.4 oz (87.5 kg) IBW/kg (Calculated) : 71.85 Heparin Dosing Weight: 87 kg  Vital Signs: Temp: 97.8 F (36.6 C) (04/27 0500) BP: 124/66 mmHg (04/27 0935) Pulse Rate: 65 (04/27 0935)  Labs:  Recent Labs  09/10/12 0545 09/10/12 0942 09/10/12 1305 09/11/12 0515  HGB 12.6* 13.7  --  12.3*  HCT 36.6* 39.0  --  35.3*  PLT 162 188  --  167  HEPARINUNFRC 0.76*  --  0.59 0.53  CREATININE  --  1.03  --   --     Estimated Creatinine Clearance: 77.9 ml/min (by C-G formula based on Cr of 1.03).  Assessment: 67 y.o. M who presented to the [facility] on 09/05/12 with CP.  Heparin level (0.53) is at goal on 1500 units/hr.    Noted plans for plavix washout over weekend and CABG 4/28.  Goal of Therapy:  Heparin level 0.3-0.7 units/ml Monitor platelets by anticoagulation protocol: Yes   Plan:  1.Continue IV heparin infusion to 1500 units/hr.  2. Daily heparin level and CBC  Newel Oien, Pharm.D., BCPS Clinical Pharmacist Pager [phone] 09/11/2012 11:30 AM

## 2012-09-12 ENCOUNTER — Inpatient Hospital Stay (HOSPITAL_COMMUNITY): Payer: Medicare Other

## 2012-09-12 ENCOUNTER — Encounter (HOSPITAL_COMMUNITY): Payer: Self-pay | Admitting: Certified Registered"

## 2012-09-12 ENCOUNTER — Encounter (HOSPITAL_COMMUNITY)
Admission: EM | Disposition: A | Discharge status: Home / Self Care | Payer: Self-pay | Source: Home / Self Care | Attending: Cardiovascular Disease

## 2012-09-12 ENCOUNTER — Encounter: Payer: Self-pay | Admitting: Cardiovascular Disease

## 2012-09-12 ENCOUNTER — Inpatient Hospital Stay (HOSPITAL_COMMUNITY): Payer: Medicare Other | Admitting: Certified Registered"

## 2012-09-12 DIAGNOSIS — I2510 Atherosclerotic heart disease of native coronary artery without angina pectoris: Secondary | ICD-10-CM

## 2012-09-12 HISTORY — PX: CORONARY ARTERY BYPASS GRAFT: SHX141

## 2012-09-12 LAB — SURGICAL PCR SCREEN
MRSA, PCR: NEGATIVE
Staphylococcus aureus: NEGATIVE

## 2012-09-12 LAB — POCT I-STAT 3, ART BLOOD GAS (G3+)
Acid-base deficit: 1 mmol/L (ref 0.0–2.0)
Acid-base deficit: 2 mmol/L (ref 0.0–2.0)
Bicarbonate: 26.1 mEq/L — ABNORMAL HIGH (ref 20.0–24.0)
Bicarbonate: 24.3 mEq/L — ABNORMAL HIGH (ref 20.0–24.0)
Bicarbonate: 23.8 mEq/L (ref 20.0–24.0)
O2 Saturation: 99 %
O2 Saturation: 98 %
O2 Saturation: 90 %
Patient temperature: 34.2
Patient temperature: 36.7
Patient temperature: 37.3
TCO2: 28 mmol/L (ref 0–100)
TCO2: 26 mmol/L (ref 0–100)
TCO2: 25 mmol/L (ref 0–100)
pCO2 arterial: 44 mmHg (ref 35.0–45.0)
pCO2 arterial: 43.6 mmHg (ref 35.0–45.0)
pCO2 arterial: 43.6 mmHg (ref 35.0–45.0)
pH, Arterial: 7.368 (ref 7.350–7.450)
pH, Arterial: 7.353 (ref 7.350–7.450)
pH, Arterial: 7.346 — ABNORMAL LOW (ref 7.350–7.450)
pO2, Arterial: 127 mmHg — ABNORMAL HIGH (ref 80.0–100.0)
pO2, Arterial: 107 mmHg — ABNORMAL HIGH (ref 80.0–100.0)
pO2, Arterial: 64 mmHg — ABNORMAL LOW (ref 80.0–100.0)

## 2012-09-12 LAB — CBC
HCT: 34 % — ABNORMAL LOW (ref 39.0–52.0)
HCT: 34.3 % — ABNORMAL LOW (ref 39.0–52.0)
HCT: 34.5 % — ABNORMAL LOW (ref 39.0–52.0)
Hemoglobin: 12.1 g/dL — ABNORMAL LOW (ref 13.0–17.0)
Hemoglobin: 11.9 g/dL — ABNORMAL LOW (ref 13.0–17.0)
Hemoglobin: 11.9 g/dL — ABNORMAL LOW (ref 13.0–17.0)
MCH: 29.5 pg (ref 26.0–34.0)
MCH: 28.9 pg (ref 26.0–34.0)
MCH: 28.6 pg (ref 26.0–34.0)
MCHC: 35.6 g/dL (ref 30.0–36.0)
MCHC: 34.7 g/dL (ref 30.0–36.0)
MCHC: 34.5 g/dL (ref 30.0–36.0)
MCV: 82.9 fL (ref 78.0–100.0)
MCV: 83.3 fL (ref 78.0–100.0)
MCV: 82.9 fL (ref 78.0–100.0)
Platelets: 172 10*3/uL (ref 150–400)
Platelets: 142 10*3/uL — ABNORMAL LOW (ref 150–400)
Platelets: 152 10*3/uL (ref 150–400)
RBC: 4.1 MIL/uL — ABNORMAL LOW (ref 4.22–5.81)
RBC: 4.12 MIL/uL — ABNORMAL LOW (ref 4.22–5.81)
RBC: 4.16 MIL/uL — ABNORMAL LOW (ref 4.22–5.81)
RDW: 14.6 % (ref 11.5–15.5)
RDW: 14.6 % (ref 11.5–15.5)
RDW: 14.5 % (ref 11.5–15.5)
WBC: 9 10*3/uL (ref 4.0–10.5)
WBC: 10.3 10*3/uL (ref 4.0–10.5)
WBC: 12.4 10*3/uL — ABNORMAL HIGH (ref 4.0–10.5)

## 2012-09-12 LAB — POCT I-STAT GLUCOSE
Glucose, Bld: 126 mg/dL — ABNORMAL HIGH (ref 70–99)
Operator id: 3402

## 2012-09-12 LAB — POCT I-STAT 4, (NA,K, GLUC, HGB,HCT)
Glucose, Bld: 93 mg/dL (ref 70–99)
Glucose, Bld: 92 mg/dL (ref 70–99)
Glucose, Bld: 128 mg/dL — ABNORMAL HIGH (ref 70–99)
Glucose, Bld: 134 mg/dL — ABNORMAL HIGH (ref 70–99)
HCT: 35 % — ABNORMAL LOW (ref 39.0–52.0)
HCT: 32 % — ABNORMAL LOW (ref 39.0–52.0)
HCT: 34 % — ABNORMAL LOW (ref 39.0–52.0)
HCT: 37 % — ABNORMAL LOW (ref 39.0–52.0)
Hemoglobin: 11.9 g/dL — ABNORMAL LOW (ref 13.0–17.0)
Hemoglobin: 10.9 g/dL — ABNORMAL LOW (ref 13.0–17.0)
Hemoglobin: 11.6 g/dL — ABNORMAL LOW (ref 13.0–17.0)
Hemoglobin: 12.6 g/dL — ABNORMAL LOW (ref 13.0–17.0)
Potassium: 3.9 mEq/L (ref 3.5–5.1)
Potassium: 4.2 mEq/L (ref 3.5–5.1)
Potassium: 4.4 mEq/L (ref 3.5–5.1)
Potassium: 4.3 mEq/L (ref 3.5–5.1)
Sodium: 141 mEq/L (ref 135–145)
Sodium: 140 mEq/L (ref 135–145)
Sodium: 140 mEq/L (ref 135–145)
Sodium: 140 mEq/L (ref 135–145)

## 2012-09-12 LAB — POCT I-STAT, CHEM 8
BUN: 11 mg/dL (ref 6–23)
Calcium, Ion: 1.25 mmol/L (ref 1.13–1.30)
Chloride: 108 mEq/L (ref 96–112)
Creatinine, Ser: 0.9 mg/dL (ref 0.50–1.35)
Glucose, Bld: 118 mg/dL — ABNORMAL HIGH (ref 70–99)
HCT: 36 % — ABNORMAL LOW (ref 39.0–52.0)
Hemoglobin: 12.2 g/dL — ABNORMAL LOW (ref 13.0–17.0)
Potassium: 4.6 mEq/L (ref 3.5–5.1)
Sodium: 137 mEq/L (ref 135–145)
TCO2: 25 mmol/L (ref 0–100)

## 2012-09-12 LAB — PROTIME-INR
INR: 0.99 (ref 0.00–1.49)
Prothrombin Time: 13 seconds (ref 11.6–15.2)

## 2012-09-12 LAB — GLUCOSE, CAPILLARY
Glucose-Capillary: 106 mg/dL — ABNORMAL HIGH (ref 70–99)
Glucose-Capillary: 108 mg/dL — ABNORMAL HIGH (ref 70–99)
Glucose-Capillary: 102 mg/dL — ABNORMAL HIGH (ref 70–99)
Glucose-Capillary: 96 mg/dL (ref 70–99)
Glucose-Capillary: 114 mg/dL — ABNORMAL HIGH (ref 70–99)
Glucose-Capillary: 124 mg/dL — ABNORMAL HIGH (ref 70–99)

## 2012-09-12 LAB — APTT: aPTT: 29 seconds (ref 24–37)

## 2012-09-12 LAB — CREATININE, SERUM
Creatinine, Ser: 0.99 mg/dL (ref 0.50–1.35)
GFR calc Af Amer: >90 mL/min (ref >90)
GFR calc non Af Amer: 83 mL/min — ABNORMAL LOW (ref >90)

## 2012-09-12 LAB — HEPARIN LEVEL (UNFRACTIONATED): Heparin Unfractionated: 0.49 IU/mL (ref 0.30–0.70)

## 2012-09-12 LAB — MAGNESIUM: Magnesium: 2.6 mg/dL — ABNORMAL HIGH (ref 1.5–2.5)

## 2012-09-12 SURGERY — OFF PUMP CORONARY ARTERY BYPASS GRAFTING (CABG)
Anesthesia: General | Site: Chest | Wound class: Clean

## 2012-09-12 MED ORDER — MIDAZOLAM HCL 5 MG/5ML IJ SOLN
INTRAMUSCULAR | Status: DC | PRN
  Administered 2012-09-12: 3 mg via INTRAVENOUS
  Administered 2012-09-12 (×2): 2 mg via INTRAVENOUS
  Administered 2012-09-12: 3 mg via INTRAVENOUS

## 2012-09-12 MED ORDER — PHENYLEPHRINE HCL 10 MG/ML IJ SOLN
0.0000 ug/min | INTRAVENOUS | Status: DC
  Administered 2012-09-12: 5 ug/min via INTRAVENOUS
  Administered 2012-09-13: 20 ug/min via INTRAVENOUS
  Filled 2012-09-12: qty 2

## 2012-09-12 MED ORDER — ASPIRIN EC 325 MG PO TBEC
325.0000 mg | DELAYED_RELEASE_TABLET | Freq: Every day | ORAL | Status: DC
  Administered 2012-09-13 – 2012-09-18 (×6): 325 mg via ORAL
  Filled 2012-09-12 (×6): qty 1

## 2012-09-12 MED ORDER — LACTATED RINGERS IV SOLN
INTRAVENOUS | Status: DC | PRN
  Administered 2012-09-12 (×2): via INTRAVENOUS

## 2012-09-12 MED ORDER — DEXTROSE 5 % IV SOLN
1.5000 g | Freq: Two times a day (BID) | INTRAVENOUS | Status: AC
  Administered 2012-09-12 – 2012-09-14 (×4): 1.5 g via INTRAVENOUS
  Filled 2012-09-12 (×4): qty 1.5

## 2012-09-12 MED ORDER — PHENYLEPHRINE HCL 10 MG/ML IJ SOLN: 10.0000 mg | INTRAVENOUS | Status: DC | PRN

## 2012-09-12 MED ORDER — ASPIRIN 81 MG PO CHEW
324.0000 mg | CHEWABLE_TABLET | Freq: Every day | ORAL | Status: DC
  Filled 2012-09-12: qty 4

## 2012-09-12 MED ORDER — PROPOFOL 10 MG/ML IV BOLUS
INTRAVENOUS | Status: DC | PRN
  Administered 2012-09-12: 30 mg via INTRAVENOUS

## 2012-09-12 MED ORDER — METOPROLOL TARTRATE 25 MG/10 ML ORAL SUSPENSION
12.5000 mg | Freq: Two times a day (BID) | ORAL | Status: DC
  Filled 2012-09-12 (×7): qty 5

## 2012-09-12 MED ORDER — MAGNESIUM SULFATE 40 MG/ML IJ SOLN
4.0000 g | Freq: Once | INTRAMUSCULAR | Status: AC
  Administered 2012-09-12: 4 g via INTRAVENOUS
  Filled 2012-09-12: qty 100

## 2012-09-12 MED ORDER — INSULIN ASPART 100 UNIT/ML ~~LOC~~ SOLN
0.0000 [IU] | SUBCUTANEOUS | Status: DC
  Administered 2012-09-13: 4 [IU] via SUBCUTANEOUS

## 2012-09-12 MED ORDER — HEMOSTATIC AGENTS (NO CHARGE) OPTIME
TOPICAL | Status: DC | PRN
  Administered 2012-09-12: 1 via TOPICAL

## 2012-09-12 MED ORDER — LACTATED RINGERS IV SOLN
INTRAVENOUS | Status: DC | PRN
  Administered 2012-09-12: 07:00:00 via INTRAVENOUS

## 2012-09-12 MED ORDER — SODIUM CHLORIDE 0.9 % IJ SOLN
3.0000 mL | Freq: Two times a day (BID) | INTRAMUSCULAR | Status: DC
  Administered 2012-09-13 – 2012-09-14 (×3): 3 mL via INTRAVENOUS

## 2012-09-12 MED ORDER — SODIUM CHLORIDE 0.9 % IJ SOLN: 3.0000 mL | INTRAMUSCULAR | Status: DC | PRN

## 2012-09-12 MED ORDER — ONDANSETRON HCL 4 MG/2ML IJ SOLN
4.0000 mg | Freq: Four times a day (QID) | INTRAMUSCULAR | Status: DC | PRN
  Administered 2012-09-13: 4 mg via INTRAVENOUS
  Filled 2012-09-12: qty 2

## 2012-09-12 MED ORDER — DEXMEDETOMIDINE HCL IN NACL 200 MCG/50ML IV SOLN: 0.1000 ug/kg/h | INTRAVENOUS | Status: DC

## 2012-09-12 MED ORDER — INSULIN ASPART 100 UNIT/ML ~~LOC~~ SOLN
0.0000 [IU] | SUBCUTANEOUS | Status: AC
  Administered 2012-09-12: 2 [IU] via SUBCUTANEOUS

## 2012-09-12 MED ORDER — ARTIFICIAL TEARS OP OINT
TOPICAL_OINTMENT | OPHTHALMIC | Status: DC | PRN
  Administered 2012-09-12: 1 via OPHTHALMIC

## 2012-09-12 MED ORDER — FAMOTIDINE IN NACL 20-0.9 MG/50ML-% IV SOLN
20.0000 mg | Freq: Two times a day (BID) | INTRAVENOUS | Status: AC
  Administered 2012-09-12: 20 mg via INTRAVENOUS

## 2012-09-12 MED ORDER — 0.9 % SODIUM CHLORIDE (POUR BTL) OPTIME
TOPICAL | Status: DC | PRN
  Administered 2012-09-12: 1000 mL

## 2012-09-12 MED ORDER — ACETAMINOPHEN 160 MG/5ML PO SOLN
975.0000 mg | Freq: Four times a day (QID) | ORAL | Status: AC
  Filled 2012-09-12: qty 40.6

## 2012-09-12 MED ORDER — METOPROLOL TARTRATE 12.5 MG HALF TABLET
12.5000 mg | ORAL_TABLET | Freq: Two times a day (BID) | ORAL | Status: DC
  Administered 2012-09-13 – 2012-09-15 (×4): 12.5 mg via ORAL
  Filled 2012-09-12 (×7): qty 1

## 2012-09-12 MED ORDER — SODIUM CHLORIDE 0.9 % IV SOLN: 250.0000 mL | INTRAVENOUS | Status: DC

## 2012-09-12 MED ORDER — HEPARIN SODIUM (PORCINE) 1000 UNIT/ML IJ SOLN
INTRAMUSCULAR | Status: DC | PRN
  Administered 2012-09-12: 12000 [IU] via INTRAVENOUS
  Administered 2012-09-12: 2000 [IU] via INTRAVENOUS

## 2012-09-12 MED ORDER — METOCLOPRAMIDE HCL 5 MG/ML IJ SOLN
10.0000 mg | Freq: Four times a day (QID) | INTRAMUSCULAR | Status: AC
  Administered 2012-09-12 – 2012-09-13 (×4): 10 mg via INTRAVENOUS
  Filled 2012-09-12 (×4): qty 2

## 2012-09-12 MED ORDER — SODIUM CHLORIDE 0.9 % IV SOLN
INTRAVENOUS | Status: DC
  Administered 2012-09-12: 12:00:00 via INTRAVENOUS

## 2012-09-12 MED ORDER — MIDAZOLAM HCL 2 MG/2ML IJ SOLN
2.0000 mg | INTRAMUSCULAR | Status: DC | PRN
  Filled 2012-09-12: qty 2

## 2012-09-12 MED ORDER — LACTATED RINGERS IV SOLN: 500.0000 mL | Freq: Once | INTRAVENOUS | Status: AC | PRN

## 2012-09-12 MED ORDER — INSULIN REGULAR BOLUS VIA INFUSION
0.0000 [IU] | Freq: Three times a day (TID) | INTRAVENOUS | Status: DC
  Filled 2012-09-12: qty 10

## 2012-09-12 MED ORDER — LIDOCAINE HCL (CARDIAC) 20 MG/ML IV SOLN
INTRAVENOUS | Status: DC | PRN
  Administered 2012-09-12: 20 mg via INTRAVENOUS

## 2012-09-12 MED ORDER — DOCUSATE SODIUM 100 MG PO CAPS
200.0000 mg | ORAL_CAPSULE | Freq: Every day | ORAL | Status: DC
  Administered 2012-09-13 – 2012-09-18 (×6): 200 mg via ORAL
  Filled 2012-09-12 (×6): qty 2

## 2012-09-12 MED ORDER — ACETAMINOPHEN 500 MG PO TABS
1000.0000 mg | ORAL_TABLET | Freq: Four times a day (QID) | ORAL | Status: AC
  Administered 2012-09-13 – 2012-09-17 (×18): 1000 mg via ORAL
  Filled 2012-09-12 (×23): qty 2

## 2012-09-12 MED ORDER — ALBUMIN HUMAN 5 % IV SOLN
250.0000 mL | INTRAVENOUS | Status: AC | PRN
  Administered 2012-09-12 – 2012-09-13 (×3): 250 mL via INTRAVENOUS
  Filled 2012-09-12: qty 250

## 2012-09-12 MED ORDER — METOPROLOL TARTRATE 1 MG/ML IV SOLN: 2.5000 mg | INTRAVENOUS | Status: DC | PRN

## 2012-09-12 MED ORDER — SODIUM CHLORIDE 0.9 % IJ SOLN
OROMUCOSAL | Status: DC | PRN
  Administered 2012-09-12: 10:00:00 via TOPICAL

## 2012-09-12 MED ORDER — PANTOPRAZOLE SODIUM 40 MG PO TBEC
40.0000 mg | DELAYED_RELEASE_TABLET | Freq: Every day | ORAL | Status: DC
  Administered 2012-09-14 – 2012-09-18 (×5): 40 mg via ORAL
  Filled 2012-09-12 (×5): qty 1

## 2012-09-12 MED ORDER — CLOPIDOGREL BISULFATE 75 MG PO TABS
75.0000 mg | ORAL_TABLET | Freq: Every day | ORAL | Status: DC
  Administered 2012-09-13 – 2012-09-18 (×6): 75 mg via ORAL
  Filled 2012-09-12 (×8): qty 1

## 2012-09-12 MED ORDER — BISACODYL 5 MG PO TBEC
10.0000 mg | DELAYED_RELEASE_TABLET | Freq: Every day | ORAL | Status: DC
  Administered 2012-09-13 – 2012-09-16 (×3): 10 mg via ORAL
  Filled 2012-09-12 (×4): qty 2

## 2012-09-12 MED ORDER — INSULIN REGULAR HUMAN 100 UNIT/ML IJ SOLN
INTRAMUSCULAR | Status: DC
  Filled 2012-09-12: qty 1

## 2012-09-12 MED ORDER — POTASSIUM CHLORIDE 10 MEQ/50ML IV SOLN: 10.0000 meq | INTRAVENOUS | Status: AC

## 2012-09-12 MED ORDER — MORPHINE SULFATE 2 MG/ML IJ SOLN
1.0000 mg | INTRAMUSCULAR | Status: AC | PRN
  Administered 2012-09-12 (×2): 2 mg via INTRAVENOUS

## 2012-09-12 MED ORDER — LACTATED RINGERS IV SOLN
INTRAVENOUS | Status: DC
  Administered 2012-09-12: 12:00:00 via INTRAVENOUS

## 2012-09-12 MED ORDER — DIAZEPAM 5 MG PO TABS
5.0000 mg | ORAL_TABLET | Freq: Once | ORAL | Status: AC
  Administered 2012-09-12: 5 mg via ORAL
  Filled 2012-09-12: qty 1

## 2012-09-12 MED ORDER — NITROGLYCERIN IN D5W 200-5 MCG/ML-% IV SOLN: 0.0000 ug/min | INTRAVENOUS | Status: DC

## 2012-09-12 MED ORDER — PROTAMINE SULFATE 10 MG/ML IV SOLN
INTRAVENOUS | Status: DC | PRN
  Administered 2012-09-12: 30 mg via INTRAVENOUS
  Administered 2012-09-12 (×2): 20 mg via INTRAVENOUS
  Administered 2012-09-12: 50 mg via INTRAVENOUS

## 2012-09-12 MED ORDER — SODIUM CHLORIDE 0.45 % IV SOLN
INTRAVENOUS | Status: DC
  Administered 2012-09-12: 12:00:00 via INTRAVENOUS
  Administered 2012-09-13: 20 mL/h via INTRAVENOUS

## 2012-09-12 MED ORDER — BISACODYL 10 MG RE SUPP: 10.0000 mg | Freq: Every day | RECTAL | Status: DC

## 2012-09-12 MED ORDER — CEFUROXIME SODIUM 1.5 G IJ SOLR
1.5000 g | Freq: Two times a day (BID) | INTRAMUSCULAR | Status: DC
  Filled 2012-09-12 (×2): qty 1.5

## 2012-09-12 MED ORDER — OXYCODONE HCL 5 MG PO TABS
5.0000 mg | ORAL_TABLET | ORAL | Status: DC | PRN
  Administered 2012-09-13: 10 mg via ORAL
  Administered 2012-09-13: 5 mg via ORAL
  Administered 2012-09-13: 10 mg via ORAL
  Administered 2012-09-14 – 2012-09-15 (×5): 5 mg via ORAL
  Administered 2012-09-16: 10 mg via ORAL
  Administered 2012-09-16 – 2012-09-17 (×2): 5 mg via ORAL
  Filled 2012-09-12: qty 1
  Filled 2012-09-12: qty 2
  Filled 2012-09-12: qty 1
  Filled 2012-09-12: qty 2
  Filled 2012-09-12: qty 1
  Filled 2012-09-12 (×2): qty 2
  Filled 2012-09-12 (×4): qty 1

## 2012-09-12 MED ORDER — ROCURONIUM BROMIDE 100 MG/10ML IV SOLN
INTRAVENOUS | Status: DC | PRN
  Administered 2012-09-12: 60 mg via INTRAVENOUS
  Administered 2012-09-12 (×2): 50 mg via INTRAVENOUS
  Administered 2012-09-12: 40 mg via INTRAVENOUS

## 2012-09-12 MED ORDER — MORPHINE SULFATE 2 MG/ML IJ SOLN
2.0000 mg | INTRAMUSCULAR | Status: DC | PRN
  Administered 2012-09-12: 4 mg via INTRAVENOUS
  Administered 2012-09-12: 2 mg via INTRAVENOUS
  Administered 2012-09-13 (×2): 4 mg via INTRAVENOUS
  Administered 2012-09-13 (×2): 2 mg via INTRAVENOUS
  Administered 2012-09-13 – 2012-09-14 (×2): 4 mg via INTRAVENOUS
  Filled 2012-09-12 (×2): qty 2
  Filled 2012-09-12 (×2): qty 1
  Filled 2012-09-12 (×3): qty 2
  Filled 2012-09-12 (×3): qty 1

## 2012-09-12 MED ORDER — ACETAMINOPHEN 10 MG/ML IV SOLN
1000.0000 mg | Freq: Once | INTRAVENOUS | Status: AC
  Administered 2012-09-12: 1000 mg via INTRAVENOUS
  Filled 2012-09-12: qty 100

## 2012-09-12 MED ORDER — SUFENTANIL CITRATE 50 MCG/ML IV SOLN
INTRAVENOUS | Status: DC | PRN
  Administered 2012-09-12: 20 ug via INTRAVENOUS
  Administered 2012-09-12: 10 ug via INTRAVENOUS
  Administered 2012-09-12: 5 ug via INTRAVENOUS
  Administered 2012-09-12: 10 ug via INTRAVENOUS
  Administered 2012-09-12 (×2): 20 ug via INTRAVENOUS
  Administered 2012-09-12: 10 ug via INTRAVENOUS
  Administered 2012-09-12: 20 ug via INTRAVENOUS
  Administered 2012-09-12: 5 ug via INTRAVENOUS
  Administered 2012-09-12: 10 ug via INTRAVENOUS
  Administered 2012-09-12 (×2): 5 ug via INTRAVENOUS

## 2012-09-12 MED ORDER — VANCOMYCIN HCL IN DEXTROSE 1-5 GM/200ML-% IV SOLN
1000.0000 mg | Freq: Once | INTRAVENOUS | Status: AC
  Administered 2012-09-12: 1000 mg via INTRAVENOUS
  Filled 2012-09-12: qty 200

## 2012-09-12 MED FILL — Sodium Chloride Irrigation Soln 0.9%: Qty: 3000 | Status: AC

## 2012-09-12 MED FILL — Sodium Chloride IV Soln 0.9%: INTRAVENOUS | Qty: 2000 | Status: AC

## 2012-09-12 MED FILL — Heparin Sodium (Porcine) Inj 1000 Unit/ML: INTRAMUSCULAR | Qty: 30 | Status: AC

## 2012-09-12 SURGICAL SUPPLY — 92 items
ATTRACTOMAT 16X20 MAGNETIC DRP (DRAPES) ×2 IMPLANT
BAG DECANTER FOR FLEXI CONT (MISCELLANEOUS) ×2 IMPLANT
BANDAGE ELASTIC 4 VELCRO ST LF (GAUZE/BANDAGES/DRESSINGS) ×2 IMPLANT
BANDAGE ELASTIC 6 VELCRO ST LF (GAUZE/BANDAGES/DRESSINGS) ×2 IMPLANT
BANDAGE GAUZE ELAST BULKY 4 IN (GAUZE/BANDAGES/DRESSINGS) ×2 IMPLANT
BASKET HEART (ORDER IN 25'S) (MISCELLANEOUS)
BASKET HEART (ORDER IN 25S) (MISCELLANEOUS) IMPLANT
BENZOIN TINCTURE PRP APPL 2/3 (GAUZE/BANDAGES/DRESSINGS) ×2 IMPLANT
BLADE STERNUM SYSTEM 6 (BLADE) ×2 IMPLANT
CANISTER SUCTION 2500CC (MISCELLANEOUS) ×2 IMPLANT
CANNULA VESSEL W/WING W/VALVE (CANNULA) ×2 IMPLANT
CARDIAC SUCTION (MISCELLANEOUS) ×2 IMPLANT
CATH CPB KIT HENDRICKSON (MISCELLANEOUS) IMPLANT
CATH ROBINSON RED A/P 18FR (CATHETERS) ×2 IMPLANT
CATH THORACIC 36FR (CATHETERS) IMPLANT
CATH THORACIC 36FR RT ANG (CATHETERS) IMPLANT
CLIP RETRACTION 3.0MM CORONARY (MISCELLANEOUS) ×2 IMPLANT
CLIP TI MEDIUM 24 (CLIP) IMPLANT
CLIP TI WIDE RED SMALL 24 (CLIP) ×4 IMPLANT
CLOTH BEACON ORANGE TIMEOUT ST (SAFETY) IMPLANT
CLSR STERI-STRIP ANTIMIC 1/2X4 (GAUZE/BANDAGES/DRESSINGS) ×2 IMPLANT
CONN ST 1/4X3/8  BEN (MISCELLANEOUS) ×2
CONN ST 1/4X3/8 BEN (MISCELLANEOUS) ×2 IMPLANT
COVER SURGICAL LIGHT HANDLE (MISCELLANEOUS) ×2 IMPLANT
CRADLE DONUT ADULT HEAD (MISCELLANEOUS) ×2 IMPLANT
DRAIN CHANNEL 32F RND 10.7 FF (WOUND CARE) ×4 IMPLANT
DRAPE CARDIOVASCULAR INCISE (DRAPES) ×1
DRAPE SLUSH/WARMER DISC (DRAPES) ×2 IMPLANT
DRAPE SRG 135X102X78XABS (DRAPES) ×1 IMPLANT
DRSG COVADERM 4X14 (GAUZE/BANDAGES/DRESSINGS) ×2 IMPLANT
ELECT REM PT RETURN 9FT ADLT (ELECTROSURGICAL) ×4
ELECTRODE REM PT RTRN 9FT ADLT (ELECTROSURGICAL) ×2 IMPLANT
GLOVE BIO SURGEON STRL SZ 6 (GLOVE) ×10 IMPLANT
GLOVE BIOGEL PI IND STRL 7.0 (GLOVE) ×9 IMPLANT
GLOVE BIOGEL PI INDICATOR 7.0 (GLOVE) ×9
GLOVE EUDERMIC 7 POWDERFREE (GLOVE) ×6 IMPLANT
GOWN PREVENTION PLUS XLARGE (GOWN DISPOSABLE) ×4 IMPLANT
GOWN STRL NON-REIN LRG LVL3 (GOWN DISPOSABLE) ×16 IMPLANT
HEMOSTAT POWDER SURGIFOAM 1G (HEMOSTASIS) ×6 IMPLANT
HEMOSTAT SURGICEL 2X14 (HEMOSTASIS) ×2 IMPLANT
INSERT FOGARTY XLG (MISCELLANEOUS) IMPLANT
KIT BASIN OR (CUSTOM PROCEDURE TRAY) ×2 IMPLANT
KIT ROOM TURNOVER OR (KITS) ×2 IMPLANT
KIT SUCTION CATH 14FR (SUCTIONS) ×4 IMPLANT
KIT VASOVIEW W/TROCAR VH 2000 (KITS) ×2 IMPLANT
MARKER GRAFT CORONARY BYPASS (MISCELLANEOUS) ×2 IMPLANT
MISTER BLOWER ×2 IMPLANT
NS IRRIG 1000ML POUR BTL (IV SOLUTION) ×10 IMPLANT
PACK OPEN HEART (CUSTOM PROCEDURE TRAY) ×2 IMPLANT
PAD ARMBOARD 7.5X6 YLW CONV (MISCELLANEOUS) ×4 IMPLANT
PENCIL BUTTON HOLSTER BLD 10FT (ELECTRODE) ×2 IMPLANT
PUNCH AORTIC ROTATE 4.0MM (MISCELLANEOUS) IMPLANT
PUNCH AORTIC ROTATE 4.5MM 8IN (MISCELLANEOUS) IMPLANT
PUNCH AORTIC ROTATE 5MM 8IN (MISCELLANEOUS) IMPLANT
SPONGE GAUZE 4X4 12PLY (GAUZE/BANDAGES/DRESSINGS) ×4 IMPLANT
STRIP CLOSURE SKIN 1/2X4 (GAUZE/BANDAGES/DRESSINGS) ×2 IMPLANT
SUT BONE WAX W31G (SUTURE) ×2 IMPLANT
SUT MNCRL AB 4-0 PS2 18 (SUTURE) ×2 IMPLANT
SUT PROLENE 3 0 SH DA (SUTURE) ×2 IMPLANT
SUT PROLENE 4 0 RB 1 (SUTURE)
SUT PROLENE 4 0 SH DA (SUTURE) IMPLANT
SUT PROLENE 4-0 RB1 .5 CRCL 36 (SUTURE) IMPLANT
SUT PROLENE 6 0 C 1 30 (SUTURE) ×10 IMPLANT
SUT PROLENE 7 0 BV1 MDA (SUTURE) ×2 IMPLANT
SUT PROLENE 8 0 BV175 6 (SUTURE) IMPLANT
SUT SILK  1 MH (SUTURE) ×2
SUT SILK 1 MH (SUTURE) ×2 IMPLANT
SUT STEEL 6MS V (SUTURE) IMPLANT
SUT STEEL STERNAL CCS#1 18IN (SUTURE) IMPLANT
SUT STEEL SZ 6 DBL 3X14 BALL (SUTURE) ×6 IMPLANT
SUT VIC AB 1 CTX 36 (SUTURE) ×2
SUT VIC AB 1 CTX36XBRD ANBCTR (SUTURE) ×2 IMPLANT
SUT VIC AB 2-0 CT1 27 (SUTURE) ×1
SUT VIC AB 2-0 CT1 TAPERPNT 27 (SUTURE) ×1 IMPLANT
SUT VIC AB 2-0 CTX 27 (SUTURE) IMPLANT
SUT VIC AB 3-0 SH 27 (SUTURE)
SUT VIC AB 3-0 SH 27X BRD (SUTURE) IMPLANT
SUT VIC AB 3-0 X1 27 (SUTURE) IMPLANT
SUT VICRYL 4-0 PS2 18IN ABS (SUTURE) IMPLANT
SUTURE E-PAK OPEN HEART (SUTURE) ×2 IMPLANT
SYS GUIDANT ACHIEVE OFF PUMP (MISCELLANEOUS) ×2 IMPLANT
SYSTEM HEARTSTRING SEAL 3.8 (VASCULAR PRODUCTS) ×1 IMPLANT
SYSTEM HEARTSTRING SEAL 3.8MM (VASCULAR PRODUCTS) ×1
SYSTEM SAHARA CHEST DRAIN ATS (WOUND CARE) ×2 IMPLANT
TAPES RETRACTO (MISCELLANEOUS) ×2 IMPLANT
TOWEL OR 17X24 6PK STRL BLUE (TOWEL DISPOSABLE) ×4 IMPLANT
TOWEL OR 17X26 10 PK STRL BLUE (TOWEL DISPOSABLE) ×4 IMPLANT
TRAY FOLEY IC TEMP SENS 14FR (CATHETERS) ×2 IMPLANT
TUBE FEEDING 8FR 16IN STR KANG (MISCELLANEOUS) IMPLANT
TUBING INSUFFLATION 10FT LAP (TUBING) ×2 IMPLANT
UNDERPAD 30X30 INCONTINENT (UNDERPADS AND DIAPERS) ×2 IMPLANT
WATER STERILE IRR 1000ML POUR (IV SOLUTION) ×4 IMPLANT

## 2012-09-12 NOTE — Anesthesia Preprocedure Evaluation (Addendum)
Anesthesia Evaluation  Patient identified by MRN, date of birth, ID band Patient awake    Reviewed: Allergy & Precautions, H&P , NPO status , Patient's Chart, lab work & pertinent test results, reviewed documented beta blocker date and time   Airway Mallampati: II TM Distance: >3 FB Neck ROM: Full    Dental  (+) Edentulous Upper, Dental Advisory Given and Partial Lower   Pulmonary shortness of breath, asthma , COPD COPD inhaler, Current Smoker,  breath sounds clear to auscultation  Pulmonary exam normal       Cardiovascular hypertension, Pt. on medications and Pt. on home beta blockers + angina with exertion and at rest + CAD (stents x2 occluded, RCA stent patent, normal LVF), + Past MI, + Cardiac Stents and + Peripheral Vascular Disease Rhythm:Regular Rate:Bradycardia     Neuro/Psych negative neurological ROS     GI/Hepatic negative GI ROS, Neg liver ROS,   Endo/Other  negative endocrine ROS  Renal/GU negative Renal ROS     Musculoskeletal   Abdominal   Peds  Hematology   Anesthesia Other Findings   Reproductive/Obstetrics                          Anesthesia Physical Anesthesia Plan  ASA: III  Anesthesia Plan: General   Post-op Pain Management:    Induction: Intravenous  Airway Management Planned: Oral ETT  Additional Equipment: Arterial line, CVP and PA Cath  Intra-op Plan:   Post-operative Plan: Post-operative intubation/ventilation  Informed Consent: I have reviewed the patients History and Physical, chart, labs and discussed the procedure including the risks, benefits and alternatives for the proposed anesthesia with the patient or authorized representative who has indicated his/her understanding and acceptance.   Dental advisory given  Plan Discussed with: CRNA and Surgeon  Anesthesia Plan Comments: (Plan routine monitors, A line, PA catheter, GETA with post op ventilation)         Anesthesia Quick Evaluation

## 2012-09-12 NOTE — Preoperative (Signed)
Beta Blockers   Reason not to administer Beta Blockers:Not Applicable, last dose 09/12/12 at 05:32am

## 2012-09-12 NOTE — Progress Notes (Addendum)
Received from OR with bedside report from CRNA.  VSS on low dose neo gtt.  MAP 70's, CI 1.8.  SR without ectopy, pacemaker off.  Incisions intact, dressings C/D/I.  CT output minimal.  UOP adequate.  Lab values WNL.  Bair hugger on for core temp 93.7.  See flowsheet for full assessment details.  Will continue to closely monitor.

## 2012-09-12 NOTE — Transfer of Care (Signed)
Immediate Anesthesia Transfer of Care Note  Patient: Brian Brock  Procedure(s) Performed: Procedure(s) with comments: OFF PUMP CORONARY ARTERY BYPASS GRAFTING (CABG) (N/A) - Times two using left internal mammary artery and endoscopically harvested right saphenous vein  Patient Location: SICU  Anesthesia Type:General  Level of Consciousness: Patient remains intubated per anesthesia plan  Airway & Oxygen Therapy: Patient remains intubated per anesthesia plan and Patient placed on Ventilator (see vital sign flow sheet for setting)  Post-op Assessment: Report given to PACU RN  Post vital signs: Reviewed and stable  Complications: No apparent anesthesia complications

## 2012-09-12 NOTE — Anesthesia Postprocedure Evaluation (Signed)
  Anesthesia Post-op Note  Patient: Brian Brock  Procedure(s) Performed: Procedure(s) with comments: OFF PUMP CORONARY ARTERY BYPASS GRAFTING (CABG) (N/A) - Times two using left internal mammary artery and endoscopically harvested right saphenous vein  Patient Location: SICU  Anesthesia Type:General  Level of Consciousness: Patient remains intubated per anesthesia plan  Airway and Oxygen Therapy: Patient remains intubated per anesthesia plan and Patient placed on Ventilator (see vital sign flow sheet for setting)  Post-op Pain: none  Post-op Assessment: Post-op Vital signs reviewed, Patient's Cardiovascular Status Stable and Respiratory Function Stable  Post-op Vital Signs: Reviewed and stable  Complications: No apparent anesthesia complications

## 2012-09-12 NOTE — Anesthesia Procedure Notes (Addendum)
Procedure Name: Intubation Date/Time: 09/12/2012 7:48 AM Performed by: Jefm Miles E Pre-anesthesia Checklist: Patient identified, Timeout performed, Emergency Drugs available, Suction available and Patient being monitored Patient Re-evaluated:Patient Re-evaluated prior to inductionOxygen Delivery Method: Circle system utilized Preoxygenation: Pre-oxygenation with 100% oxygen Intubation Type: IV induction Laryngoscope Size: Miller and 2 Grade View: Grade I Tube type: Oral Tube size: 8.0 mm Number of attempts: 1 Airway Equipment and Method: Stylet and Oral airway Placement Confirmation: ETT inserted through vocal cords under direct vision,  positive ETCO2 and breath sounds checked- equal and bilateral Secured at: 24 cm Tube secured with: Tape Dental Injury: Teeth and Oropharynx as per pre-operative assessment     PA catheter:  Routine monitors. Timeout, sterile prep, drape, FBP R neck.  Trendelenburg position.  1% Lido local, finder and trocar RIJ 1st pass with US guidance.  Cordis placed over J wire. PA catheter in easily.  Sterile dressing applied.  Patient tolerated well, VSS.  Sandford Craze, MD  [phone]

## 2012-09-12 NOTE — Interval H&P Note (Signed)
History and Physical Interval Note:  09/12/2012 7:30 AM  Brian Brock  has presented today for surgery, with the diagnosis of CAD  The various methods of treatment have been discussed with the patient and family. After consideration of risks, benefits and other options for treatment, the patient has consented to  Procedure(s): CORONARY ARTERY BYPASS GRAFTING (CABG) (N/A) as a surgical intervention .  The patient's history has been reviewed, patient examined, no change in status, stable for surgery.  I have reviewed the patient's chart and labs.  Questions were answered to the patient's satisfaction.     Bretta Fees C

## 2012-09-12 NOTE — Procedures (Signed)
Extubation Procedure Note  Patient Details:   Name: Brian Brock DOB: 26-Jul-1945 MRN: [id number]   Airway Documentation:     Evaluation  O2 sats: stable throughout Complications: No apparent complications Patient did tolerate procedure well. Bilateral Breath Sounds: Clear   Yes  Pt tolerated rapid wean, VC, -20 NIF, and positive for cuff leak. Pt extubated to 4lpm White, no dypsnea or stridor noted after extubation. Pt resting comfortably at this time, vitals are within normal limits. RT will continue to monitor.   Beatris Si 09/12/2012, 6:13 PM

## 2012-09-12 NOTE — Progress Notes (Signed)
5% albumin x 2 given for CI 1.6, and pacer on AAI @ 80.  Index improved to > 1.8.  MAP 70's.  Will continue to monitor.

## 2012-09-12 NOTE — Progress Notes (Signed)
Just extubated  C/o pain  BP 109/62  Pulse 79  Temp(Src) 98.2 F (36.8 C) (Core (Comment))  Resp 20  Ht 5\' 9"  (1.753 m)  Wt 192 lb 14.4 oz (87.5 kg)  BMI 28.47 kg/m2  SpO2 95%  CI= 2.42   Intake/Output Summary (Last 24 hours) at 09/12/12 1813 Last data filed at 09/12/12 1800  Gross per 24 hour  Intake 3933.39 ml  Output   2590 ml  Net 1343.39 ml    Doing well Will not use toradol due to his reaction to meloxicam

## 2012-09-12 NOTE — H&P (View-Only) (Signed)
Reason for Consult:2 vessel CAD with unstable coronary syndrome Referring Physician: Dr. [name] is an 67 y.o. male.  HPI: 67 yo male with a long history of CAD and multiple previous stents presents with a cc/o chest pain.  Brian Brock is a 67 yo man who has a strong family history of CAD(father and brother), dyslipdemia, and a long standing history of tobacco abuse. He has had multiple previous PCI with stents in all 3 major coronaries, most recently in 2004. He had done well since that time until 3 days ago when he experienced an episode of prolonged CP. He was at work talking with a customer when he developed the sudden onset of severe substernal chest pain. This lasted about 15- 20 minutes. He suspected it was his heart and sought medical attention. He was admitted and ruled out for MI but a stress Cardiolite study was positive for apical ischemia. Today he underwent cardiac cathererization which revealed severe 2 vessel CAD with tandem 90% stenoses in the LAD and a 60-70% stenosis in the circumflex. The stent in the RCA was patent and there was no significant disease in the RCA. He currently is pain free.  Past Medical History  Diagnosis Date  . Coronary artery disease     Multiple stents  . Myocardial infarct   . Asthma   . COPD (chronic obstructive pulmonary disease)   . Peripheral vascular disease     prior stenting  . Dyslipidemia     Past Surgical History  Procedure Laterality Date  . Coronary stent placement  '97, '98, '04    seven cardiac stents  . Peripheral vascular dis Bilateral 7/06  8/08    Lt SFA '06, Rt SFA 8/08  . Cholecystectomy  June 2010    Family History  Problem Relation Age of Onset  . Coronary artery disease Father 64  . Coronary artery disease Brother 68    CABG in 2000    Social History:  reports that he has been smoking Cigarettes.  He has a 54 pack-year smoking history. He does not have any smokeless tobacco history on file. He  reports that  drinks alcohol. He reports that he does not use illicit drugs.  Allergies:  Allergies  Allergen Reactions  . Amoxicillin-Pot Clavulanate     Unknown reaction  . Levofloxacin     Unknown reaction  . Meloxicam     Unknown reaction  . Metaxalone     Unknown reaction  . Sulfonamide Derivatives     unknown reaction    Medications:  Prior to Admission:  Prescriptions prior to admission  Medication Sig Dispense Refill  . acidophilus (RISAQUAD) CAPS Take 1 capsule by mouth daily.      [name] aspirin EC 81 MG tablet Take 81 mg by mouth daily.      . cholestyramine (QUESTRAN) 4 G packet Take 1 packet by mouth 2 (two) times daily with a meal.      . clopidogrel (PLAVIX) 75 MG tablet Take 75 mg by mouth daily.      [name] ezetimibe (ZETIA) 10 MG tablet Take 10 mg by mouth every evening.      [name] losartan (COZAAR) 50 MG tablet Take 50 mg by mouth every evening.      . nebivolol (BYSTOLIC) 2.5 MG tablet Take 2.5 mg by mouth daily.      . pantoprazole (PROTONIX) 40 MG tablet Take 40 mg by mouth 2 (two) times daily.      [name]  simvastatin (ZOCOR) 80 MG tablet Take 80 mg by mouth at bedtime.      [name] HYDROcodone-acetaminophen (NORCO/VICODIN) 5-325 MG per tablet Take 1 tablet by mouth every 6 (six) hours as needed for pain.        Results for orders placed during the hospital encounter of 09/05/12 (from the past 48 hour(s))  HEMOGLOBIN A1C     Status: Abnormal   Collection Time    09/05/12  5:05 PM      Result Value Range   Hemoglobin A1C 6.5 (*) <5.7 %   Comment: (NOTE)                                                                               According to the ADA Clinical Practice Recommendations for 2011, when     HbA1c is used as a screening test:      >=6.5%   Diagnostic of Diabetes Mellitus               (if abnormal result is confirmed)     5.7-6.4%   Increased risk of developing Diabetes Mellitus     References:Diagnosis and Classification of Diabetes Mellitus,Diabetes      Care,2011,34(Suppl 1):S62-S69 and Standards of Medical Care in             Diabetes - 2011,Diabetes Care,2011,34 (Suppl 1):S11-S61.   Mean Plasma Glucose 140 (*) <117 mg/dL  TROPONIN I     Status: None   Collection Time    09/05/12  5:14 PM      Result Value Range   Troponin I <0.30  <0.30 ng/mL   Comment:            Due to the release kinetics of cTnI,     a negative result within the first hours     of the onset of symptoms does not rule out     myocardial infarction with certainty.     If myocardial infarction is still suspected,     repeat the test at appropriate intervals.  TROPONIN I     Status: None   Collection Time    09/05/12 10:17 PM      Result Value Range   Troponin I <0.30  <0.30 ng/mL   Comment:            Due to the release kinetics of cTnI,     a negative result within the first hours     of the onset of symptoms does not rule out     myocardial infarction with certainty.     If myocardial infarction is still suspected,     repeat the test at appropriate intervals.  HEPARIN LEVEL (UNFRACTIONATED)     Status: Abnormal   Collection Time    09/05/12 11:24 PM      Result Value Range   Heparin Unfractionated 0.17 (*) 0.30 - 0.70 IU/mL   Comment:            IF HEPARIN RESULTS ARE BELOW     EXPECTED VALUES, AND PATIENT     DOSAGE HAS BEEN CONFIRMED,     SUGGEST FOLLOW UP TESTING     OF ANTITHROMBIN III LEVELS.  TROPONIN  I     Status: None   Collection Time    09/06/12  4:55 AM      Result Value Range   Troponin I <0.30  <0.30 ng/mL   Comment:            Due to the release kinetics of cTnI,     a negative result within the first hours     of the onset of symptoms does not rule out     myocardial infarction with certainty.     If myocardial infarction is still suspected,     repeat the test at appropriate intervals.  CBC     Status: Abnormal   Collection Time    09/06/12  4:55 AM      Result Value Range   WBC 12.2 (*) 4.0 - 10.5 K/uL   RBC 4.77  4.22 -  5.81 MIL/uL   Hemoglobin 13.8  13.0 - 17.0 g/dL   HCT 16.1  09.6 - 04.5 %   MCV 82.4  78.0 - 100.0 fL   MCH 28.9  26.0 - 34.0 pg   MCHC 35.1  30.0 - 36.0 g/dL   RDW 40.9  81.1 - 91.4 %   Platelets 173  150 - 400 K/uL  BASIC METABOLIC PANEL     Status: Abnormal   Collection Time    09/06/12  4:55 AM      Result Value Range   Sodium 138  135 - 145 mEq/L   Potassium 3.9  3.5 - 5.1 mEq/L   Chloride 103  96 - 112 mEq/L   CO2 27  19 - 32 mEq/L   Glucose, Bld 125 (*) 70 - 99 mg/dL   BUN 15  6 - 23 mg/dL   Creatinine, Ser 7.82  0.50 - 1.35 mg/dL   Calcium 8.6  8.4 - 95.6 mg/dL   GFR calc non Af Amer 75 (*) >90 mL/min   GFR calc Af Amer 86 (*) >90 mL/min   Comment:            The eGFR has been calculated     using the CKD EPI equation.     This calculation has not been     validated in all clinical     situations.     eGFR's persistently     <90 mL/min signify     possible Chronic Kidney Disease.  PROTIME-INR     Status: None   Collection Time    09/06/12  4:55 AM      Result Value Range   Prothrombin Time 12.5  11.6 - 15.2 seconds   INR 0.94  0.00 - 1.49  LIPID PANEL     Status: Abnormal   Collection Time    09/06/12  4:55 AM      Result Value Range   Cholesterol 155  0 - 200 mg/dL   Triglycerides 213 (*) <150 mg/dL   HDL 52  >08 mg/dL   Total CHOL/HDL Ratio 3.0     VLDL 36  0 - 40 mg/dL   LDL Cholesterol 67  0 - 99 mg/dL   Comment:            Total Cholesterol/HDL:CHD Risk     Coronary Heart Disease Risk Table                         Men   Women      1/2 Average Risk   3.4  3.3      Average Risk       5.0   4.4      2 X Average Risk   9.6   7.1      3 X Average Risk  23.4   11.0                Use the calculated Patient Ratio     above and the CHD Risk Table     to determine the patient's CHD Risk.                ATP III CLASSIFICATION (LDL):      <100     mg/dL   Optimal      811-914  mg/dL   Near or Above                        Optimal      130-159  mg/dL    Borderline      782-956  mg/dL   High      >213     mg/dL   Very High  HEPARIN LEVEL (UNFRACTIONATED)     Status: None   Collection Time    09/06/12  4:55 AM      Result Value Range   Heparin Unfractionated 0.43  0.30 - 0.70 IU/mL   Comment:            IF HEPARIN RESULTS ARE BELOW     EXPECTED VALUES, AND PATIENT     DOSAGE HAS BEEN CONFIRMED,     SUGGEST FOLLOW UP TESTING     OF ANTITHROMBIN III LEVELS.  HEPARIN LEVEL (UNFRACTIONATED)     Status: None   Collection Time    09/06/12 12:56 PM      Result Value Range   Heparin Unfractionated 0.49  0.30 - 0.70 IU/mL   Comment:            IF HEPARIN RESULTS ARE BELOW     EXPECTED VALUES, AND PATIENT     DOSAGE HAS BEEN CONFIRMED,     SUGGEST FOLLOW UP TESTING     OF ANTITHROMBIN III LEVELS.  CBC     Status: Abnormal   Collection Time    09/07/12  5:12 AM      Result Value Range   WBC 10.1  4.0 - 10.5 K/uL   RBC 4.47  4.22 - 5.81 MIL/uL   Hemoglobin 12.9 (*) 13.0 - 17.0 g/dL   HCT 08.6 (*) 57.8 - 46.9 %   MCV 82.8  78.0 - 100.0 fL   MCH 28.9  26.0 - 34.0 pg   MCHC 34.9  30.0 - 36.0 g/dL   RDW 62.9  52.8 - 41.3 %   Platelets 180  150 - 400 K/uL  HEPARIN LEVEL (UNFRACTIONATED)     Status: None   Collection Time    09/07/12  5:12 AM      Result Value Range   Heparin Unfractionated 0.54  0.30 - 0.70 IU/mL   Comment:            IF HEPARIN RESULTS ARE BELOW     EXPECTED VALUES, AND PATIENT     DOSAGE HAS BEEN CONFIRMED,     SUGGEST FOLLOW UP TESTING     OF ANTITHROMBIN III LEVELS.  BASIC METABOLIC PANEL     Status: Abnormal   Collection Time    09/07/12  5:12 AM      Result Value  Range   Sodium 140  135 - 145 mEq/L   Potassium 3.8  3.5 - 5.1 mEq/L   Chloride 108  96 - 112 mEq/L   CO2 26  19 - 32 mEq/L   Glucose, Bld 115 (*) 70 - 99 mg/dL   BUN 13  6 - 23 mg/dL   Creatinine, Ser 1.61  0.50 - 1.35 mg/dL   Calcium 8.6  8.4 - 09.6 mg/dL   GFR calc non Af Amer 76 (*) >90 mL/min   GFR calc Af Amer 89 (*) >90 mL/min    Comment:            The eGFR has been calculated     using the CKD EPI equation.     This calculation has not been     validated in all clinical     situations.     eGFR's persistently     <90 mL/min signify     possible Chronic Kidney Disease.    Nm Myocar Multi W/spect W/wall Motion / Ef  09/06/2012  *RADIOLOGY REPORT*  Clinical Data:  67 year old male with substernal chest pain. History of myocardial infarction and prior coronary artery stenting.  MYOCARDIAL IMAGING WITH SPECT (REST AND PHARMACOLOGIC-STRESS) GATED LEFT VENTRICULAR WALL MOTION STUDY LEFT VENTRICULAR EJECTION FRACTION  Technique:  Resting myocardial SPECT imaging was initially performed after intravenous administration of radiopharmaceutical. Myocardial SPECT was subsequently performed after additional radiopharmaceutical injection during pharmacologic-stress supervised by the Cardiology staff.  Quantitative gated imaging was also performed to evaluate left ventricular wall motion, and estimate left ventricular ejection fraction.  Radiopharmaceutical:  Tc-59m Cardiolite at rest and during stress.  Comparison: none  Findings:  Technique: Study is adequate.  Perfusion: There is a small focus of decreased counts within the apical segment of the anterolateral wall which improves from stress to rest.  No additional evidence of ischemia or infarction.  Wall motion:  No focal wall motion abnormality.  Normal contractility.  Left ventricular ejection fraction:  Calculated left ventricular ejection fraction = 66%.  End-diastolic volume equals 72 ml End-systolic volume equals 25 ml  IMPRESSION:  1. Small region of reversible ischemia within the apical segment of the anterolateral wall. 2.  Normal wall motion. 3.  Left ventricular ejection fraction equal66%  These results will be called to the ordering clinician or representative by the Radiologist Assistant, and communication documented in the PACS Dashboard.   Original Report  Authenticated By: Genevive Bi, M.D.     Review of Systems  Constitutional: Positive for malaise/fatigue.  Respiratory: Positive for shortness of breath.        Recent exacerbation of COPD/ bronchitis, treated with prednisone and antibiotics  Cardiovascular: Positive for chest pain.  Neurological: Negative.   All other systems reviewed and are negative.   Blood pressure 184/67, pulse 53, temperature 97.6 F (36.4 C), temperature source Oral, resp. rate 18, height 5' 9.5" (1.765 m), weight 192 lb (87.091 kg), SpO2 99.00%. Physical Exam  Vitals reviewed. Constitutional: He is oriented to person, place, and time. He appears well-developed and well-nourished. No distress.  HENT:  Head: Normocephalic and atraumatic.  Eyes: EOM are normal. Pupils are equal, round, and reactive to light.  Neck: Neck supple. No thyromegaly present.  No carotid bruits  Cardiovascular: Normal rate, regular rhythm, normal heart sounds and intact distal pulses.  Exam reveals no gallop and no friction rub.   No murmur heard. Respiratory: Effort normal and breath sounds normal. He has no wheezes. He has no  rales.  GI: Soft. There is no tenderness.  Musculoskeletal: He exhibits no edema.  Lymphadenopathy:    He has no cervical adenopathy.  Neurological: He is alert and oriented to person, place, and time. No cranial nerve deficit.  No focal motor deficit  Skin: Skin is warm and dry.  Psychiatric: He has a normal mood and affect.    CARDIAC CATHETERIZATION 09/07/12 AO: 130/65  LV: 130/12  LM: 30 - 40% smooth distal  LAD: 90% eccentric ostial, patent proximal stent, 90% eccentric stenosis post stent and septal  LCx: 60% ostial and proximal with patent LCX stent prior to OM take-off  RCA: patent proximal stent; without significant disease  L subclavian/LIMA: patent and suitable for CABG  LV: EF 60% with minimal focal mid inferior hypokinesis  AO: No RAS  Tolerated well  Rec: Surgical consultation for  probable CABG.  Brian Bihari, MD, [facility]  09/07/2012  Assessment/Plan: 66 yo man with multiple CRF and known CAD presents with new onset unstable angina. At catheterization he has severe 2 vessel CAD including ostial LAD disease. CABG is indicated for survival benefit and relief of symptoms.   He is a candidate for off pump grafting  I have discussed with the patient and his wife the general nature of the procedure, the need for general anesthesia, and the incisions to be used. I discussed the expected hospital stay, overall recovery and short and long term outcomes. They understand the risks include, but are not limited to death, stroke, MI, DVT/PE, bleeding, possible need for transfusion, infections, irregular heart rhythms, and other organ system dysfunction, including respiratory, renal, or GI complications. He accepts these risks and agrees to proceed.  He has been on plavix and we need to allow for plavix "wash out" prior to surgery to avoid an excessive risk of bleeding and associated complications. Will stop plavix today and plan for CABG on Monday 4/28  Cherokee Boccio C 09/07/2012, 12:50 PM

## 2012-09-12 NOTE — Brief Op Note (Addendum)
09/05/2012 - 09/12/2012  10:37 AM  PATIENT:  Brian Brock  67 y.o. male  PRE-OPERATIVE DIAGNOSIS:  Coronary Artery Disease  POST-OPERATIVE DIAGNOSIS:  Coronary Artery Disease  PROCEDURE:    OFF PUMP CORONARY ARTERY BYPASS GRAFTING x 2 (LIMA-LAD, SVG-Cx)   ENDOSCOPIC VEIN HARVEST RIGHT THIGH  SURGEON:  Loreli Slot, MD  ASSISTANT: Coral Ceo, PA-C  ANESTHESIA:   general  PATIENT CONDITION:  ICU - intubated and hemodynamically stable.  PRE-OPERATIVE WEIGHT: 87.5 kg   Good targets and conduits

## 2012-09-12 NOTE — Progress Notes (Signed)
Precedex off since 1500.  Pt now wakes to verbal stimuli, follows commands, nods in answer to questions.  CPAP trial initiated per RT.  Pt tolerating well.  VSS.  Will continue to monitor.

## 2012-09-13 ENCOUNTER — Inpatient Hospital Stay (HOSPITAL_COMMUNITY): Payer: Medicare Other

## 2012-09-13 LAB — POCT I-STAT, CHEM 8
BUN: 15 mg/dL (ref 6–23)
Calcium, Ion: 1.23 mmol/L (ref 1.13–1.30)
Chloride: 98 mEq/L (ref 96–112)
Creatinine, Ser: 1 mg/dL (ref 0.50–1.35)
Glucose, Bld: 139 mg/dL — ABNORMAL HIGH (ref 70–99)
HCT: 35 % — ABNORMAL LOW (ref 39.0–52.0)
Hemoglobin: 11.9 g/dL — ABNORMAL LOW (ref 13.0–17.0)
Potassium: 4.5 mEq/L (ref 3.5–5.1)
Sodium: 133 mEq/L — ABNORMAL LOW (ref 135–145)
TCO2: 27 mmol/L (ref 0–100)

## 2012-09-13 LAB — GLUCOSE, CAPILLARY
Glucose-Capillary: 114 mg/dL — ABNORMAL HIGH (ref 70–99)
Glucose-Capillary: 123 mg/dL — ABNORMAL HIGH (ref 70–99)
Glucose-Capillary: 128 mg/dL — ABNORMAL HIGH (ref 70–99)
Glucose-Capillary: 144 mg/dL — ABNORMAL HIGH (ref 70–99)
Glucose-Capillary: 167 mg/dL — ABNORMAL HIGH (ref 70–99)

## 2012-09-13 LAB — BASIC METABOLIC PANEL
BUN: 15 mg/dL (ref 6–23)
CO2: 24 mEq/L (ref 19–32)
Calcium: 8.3 mg/dL — ABNORMAL LOW (ref 8.4–10.5)
Chloride: 102 mEq/L (ref 96–112)
Creatinine, Ser: 1.14 mg/dL (ref 0.50–1.35)
GFR calc Af Amer: 76 mL/min — ABNORMAL LOW (ref >90)
GFR calc non Af Amer: 65 mL/min — ABNORMAL LOW (ref >90)
Glucose, Bld: 140 mg/dL — ABNORMAL HIGH (ref 70–99)
Potassium: 5.1 mEq/L (ref 3.5–5.1)
Sodium: 134 mEq/L — ABNORMAL LOW (ref 135–145)

## 2012-09-13 LAB — CBC
HCT: 33.2 % — ABNORMAL LOW (ref 39.0–52.0)
HCT: 33.3 % — ABNORMAL LOW (ref 39.0–52.0)
Hemoglobin: 11.5 g/dL — ABNORMAL LOW (ref 13.0–17.0)
Hemoglobin: 11.4 g/dL — ABNORMAL LOW (ref 13.0–17.0)
MCH: 28.8 pg (ref 26.0–34.0)
MCH: 28.6 pg (ref 26.0–34.0)
MCHC: 34.6 g/dL (ref 30.0–36.0)
MCHC: 34.2 g/dL (ref 30.0–36.0)
MCV: 83.2 fL (ref 78.0–100.0)
MCV: 83.7 fL (ref 78.0–100.0)
Platelets: 141 10*3/uL — ABNORMAL LOW (ref 150–400)
Platelets: 123 K/uL — ABNORMAL LOW (ref 150–400)
RBC: 3.99 MIL/uL — ABNORMAL LOW (ref 4.22–5.81)
RBC: 3.98 MIL/uL — ABNORMAL LOW (ref 4.22–5.81)
RDW: 14.5 % (ref 11.5–15.5)
RDW: 14.9 % (ref 11.5–15.5)
WBC: 14.8 10*3/uL — ABNORMAL HIGH (ref 4.0–10.5)
WBC: 13.3 K/uL — ABNORMAL HIGH (ref 4.0–10.5)

## 2012-09-13 LAB — CREATININE, SERUM
Creatinine, Ser: 1.02 mg/dL (ref 0.50–1.35)
GFR calc Af Amer: 86 mL/min — ABNORMAL LOW (ref >90)
GFR calc non Af Amer: 75 mL/min — ABNORMAL LOW (ref >90)

## 2012-09-13 LAB — MAGNESIUM
Magnesium: 2.3 mg/dL (ref 1.5–2.5)
Magnesium: 2 mg/dL (ref 1.5–2.5)

## 2012-09-13 MED ORDER — ENOXAPARIN SODIUM 40 MG/0.4ML ~~LOC~~ SOLN
40.0000 mg | Freq: Every day | SUBCUTANEOUS | Status: DC
  Administered 2012-09-13 – 2012-09-17 (×5): 40 mg via SUBCUTANEOUS
  Filled 2012-09-13 (×6): qty 0.4

## 2012-09-13 MED ORDER — INSULIN DETEMIR 100 UNIT/ML ~~LOC~~ SOLN
20.0000 [IU] | Freq: Every day | SUBCUTANEOUS | Status: DC
  Filled 2012-09-13: qty 0.2

## 2012-09-13 MED ORDER — INSULIN ASPART 100 UNIT/ML ~~LOC~~ SOLN
0.0000 [IU] | SUBCUTANEOUS | Status: DC
  Administered 2012-09-13: 4 [IU] via SUBCUTANEOUS
  Administered 2012-09-13 – 2012-09-14 (×5): 2 [IU] via SUBCUTANEOUS

## 2012-09-13 MED ORDER — INSULIN DETEMIR 100 UNIT/ML ~~LOC~~ SOLN
10.0000 [IU] | Freq: Every day | SUBCUTANEOUS | Status: DC
  Administered 2012-09-13 – 2012-09-16 (×3): 10 [IU] via SUBCUTANEOUS
  Filled 2012-09-13 (×5): qty 0.1

## 2012-09-13 MED ORDER — ALBUMIN HUMAN 5 % IV SOLN
12.5000 g | Freq: Once | INTRAVENOUS | Status: AC
  Administered 2012-09-13: 12.5 g via INTRAVENOUS
  Filled 2012-09-13: qty 250

## 2012-09-13 MED ORDER — FUROSEMIDE 10 MG/ML IJ SOLN
20.0000 mg | Freq: Once | INTRAMUSCULAR | Status: AC
  Administered 2012-09-13: 20 mg via INTRAVENOUS

## 2012-09-13 NOTE — Progress Notes (Signed)
Patient ID: Brian Brock, male   DOB: [birth year], 67 y.o.   MRN: [id number]                   [address]            [place] [address]          [phone]     1 Day Post-Op Procedure(s) (LRB): OFF PUMP CORONARY ARTERY BYPASS GRAFTING (CABG) (N/A)  Total Length of Stay:  LOS: 8 days  BP 138/67  Pulse 89  Temp(Src) 97.9 F (36.6 C) (Axillary)  Resp 17  Ht 5\' 9"  (1.753 m)  Wt 199 lb 4.7 oz (90.4 kg)  BMI 29.42 kg/m2  SpO2 96%  .Intake/Output     04/29 0701 - 04/30 0700   P.O. 720   I.V. (mL/kg) 322.6 (3.6)   NG/GT    IV Piggyback 300   Total Intake(mL/kg) 1342.6 (14.9)   Urine (mL/kg/hr) 915 (0.8)   Other    Blood    Chest Tube 50 (0)   Total Output 965   Net +377.6         . sodium chloride 20 mL/hr (09/13/12 1741)  . sodium chloride 20 mL/hr at 09/12/12 1200  . sodium chloride    . dexmedetomidine Stopped (09/13/12 0500)  . lactated ringers 20 mL/hr at 09/12/12 1200  . nitroGLYCERIN    . phenylephrine (NEO-SYNEPHRINE) Adult infusion 5 mcg/min (09/13/12 1000)     Lab Results  Component Value Date   WBC 13.3* 09/13/2012   HGB 11.9* 09/13/2012   HCT 35.0* 09/13/2012   PLT 123* 09/13/2012   GLUCOSE 139* 09/13/2012   CHOL 155 09/06/2012   TRIG 179* 09/06/2012   HDL 52 09/06/2012   LDLCALC 67 09/06/2012   ALT 56* 09/05/2012   AST 38* 09/05/2012   NA 133* 09/13/2012   K 4.5 09/13/2012   CL 98 09/13/2012   CREATININE 1.00 09/13/2012   BUN 15 09/13/2012   CO2 24 09/13/2012   INR 0.99 09/12/2012   HGBA1C 6.5* 09/05/2012   Stable post op, sinus rhythm   Delight Ovens MD  [facility] [phone] Office [phone] 09/13/2012 7:30 PM

## 2012-09-13 NOTE — Progress Notes (Signed)
1 Day Post-Op Procedure(s) (LRB): OFF PUMP CORONARY ARTERY BYPASS GRAFTING (CABG) (N/A) Subjective: Some incisional pain denies nausea  Objective: Vital signs in last 24 hours: Temp:  [93 F (33.9 C)-100.8 F (38.2 C)] 99.1 F (37.3 C) (04/29 0500) Pulse Rate:  [68-80] 79 (04/29 0700) Cardiac Rhythm:  [-] Atrial paced (04/28 2000) Resp:  [12-24] 13 (04/29 0700) BP: (81-114)/(51-72) 90/54 mmHg (04/29 0700) SpO2:  [95 %-100 %] 97 % (04/29 0700) Arterial Line BP: (83-137)/(45-69) 96/69 mmHg (04/29 0600) FiO2 (%):  [40 %-50 %] 40 % (04/28 1715) Weight:  [192 lb 14.4 oz (87.5 kg)-199 lb 4.7 oz (90.4 kg)] 199 lb 4.7 oz (90.4 kg) (04/29 0500)  Hemodynamic parameters for last 24 hours: PAP: (22-46)/(8-27) 38/23 mmHg CO:  [3.7 L/min-6.8 L/min] 5.2 L/min CI:  [1.8 L/min/m2-3.4 L/min/m2] 2.6 L/min/m2  Intake/Output from previous day: 04/28 0701 - 04/29 0700 In: 4973.2 [I.V.:3723.2; NG/GT:30; IV Piggyback:1220] Out: 3385 [Urine:2585; Blood:200; Chest Tube:550] Intake/Output this shift:    General appearance: alert and no distress Neurologic: intact Heart: regular rate and rhythm Lungs: diminished breath sounds bibasilar Abdomen: normal findings: soft, non-tender  Lab Results:  Recent Labs  09/12/12 1745 09/12/12 1746 09/13/12 0405  WBC 12.4*  --  14.8*  HGB 11.9* 12.2* 11.5*  HCT 34.5* 36.0* 33.2*  PLT 152  --  141*   BMET:  Recent Labs  09/10/12 0942  09/12/12 1746 09/13/12 0405  NA 138  < > 137 134*  K 4.1  < > 4.6 5.1  CL 104  --  108 102  CO2 23  --   --  24  GLUCOSE 103*  < > 118* 140*  BUN 12  --  11 15  CREATININE 1.03  < > 0.90 1.14  CALCIUM 9.4  --   --  8.3*  < > = values in this interval not displayed.  PT/INR:  Recent Labs  09/12/12 1145  LABPROT 13.0  INR 0.99   ABG    Component Value Date/Time   PHART 7.346* 09/12/2012 1859   HCO3 23.8 09/12/2012 1859   TCO2 25 09/12/2012 1859   ACIDBASEDEF 2.0 09/12/2012 1859   O2SAT 90.0 09/12/2012 1859    CBG (last 3)   Recent Labs  09/12/12 2001 09/13/12 0009 09/13/12 0412  GLUCAP 124* 114* 144*    Assessment/Plan: S/P Procedure(s) (LRB): OFF PUMP CORONARY ARTERY BYPASS GRAFTING (CABG) (N/A) POD # 1 OPCAB x 2 CV- good index, relatively hypotensive and still on neosynephrine- will give albumin, wean neo  Resume plavix- still has patent stent in RCA  RESP- pulmonary hygiene  RENAL- lytes and creatinine OK, weight up 9 lbs- diurese as BP allows  Anemia secondary to ABL - very mild, follow  CBG well controlled- transition to levimir + SSI  Lovenox for DVT proph   LOS: 8 days    Arturo Sofranko C 09/13/2012

## 2012-09-13 NOTE — Progress Notes (Signed)
Utilization Review Completed. 09/13/2012  

## 2012-09-13 NOTE — Progress Notes (Signed)
[facility]  Subjective: Chest sore.   Objective: Vital signs in last 24 hours: Temp:  [93 F (33.9 C)-100.8 F (38.2 C)] 99.1 F (37.3 C) (04/29 0500) Pulse Rate:  [68-80] 80 (04/29 0745) Resp:  [12-24] 13 (04/29 0745) BP: (81-114)/(51-72) 90/54 mmHg (04/29 0700) SpO2:  [95 %-100 %] 97 % (04/29 0745) Arterial Line BP: (73-137)/(45-69) 76/63 mmHg (04/29 0745) FiO2 (%):  [40 %-50 %] 40 % (04/28 1715) Weight:  [192 lb 14.4 oz (87.5 kg)-199 lb 4.7 oz (90.4 kg)] 199 lb 4.7 oz (90.4 kg) (04/29 0500) Last BM Date: 09/12/12  Intake/Output from previous day: 04/28 0701 - 04/29 0700 In: 4973.2 [I.V.:3723.2; NG/GT:30; IV Piggyback:1220] Out: 3385 [Urine:2585; Blood:200; Chest Tube:550] Intake/Output this shift:    Medications Current Facility-Administered Medications  Medication Dose Route Frequency Provider Last Rate Last Dose  . 0.45 % sodium chloride infusion   Intravenous Continuous Wilmon Pali, PA-C 20 mL/hr at 09/12/12 1200    . 0.9 %  sodium chloride infusion   Intravenous Continuous Wilmon Pali, PA-C 20 mL/hr at 09/12/12 1200    . 0.9 %  sodium chloride infusion  250 mL Intravenous Continuous Wilmon Pali, PA-C      . acetaminophen (TYLENOL) tablet 1,000 mg  1,000 mg Oral Q6H Wilmon Pali, PA-C   1,000 mg at 09/13/12 0534   Or  . acetaminophen (TYLENOL) solution 975 mg  975 mg Per Tube Q6H Gina L Collins, PA-C      . albumin human 5 % solution 250 mL  250 mL Intravenous Q15 min PRN Wilmon Pali, PA-C   250 mL at 09/13/12 0530  . aspirin EC tablet 325 mg  325 mg Oral Daily Wilmon Pali, PA-C   325 mg at 09/13/12 1610   Or  . aspirin chewable tablet 324 mg  324 mg Per Tube Daily Gina L Collins, PA-C      . atorvastatin (LIPITOR) tablet 80 mg  80 mg Oral q1800 Lennette Bihari, MD   80 mg at 09/11/12 1743  . bisacodyl (DULCOLAX) EC tablet 10 mg  10 mg Oral Daily Wilmon Pali, PA-C   10 mg at 09/13/12 9604   Or  . bisacodyl (DULCOLAX)  suppository 10 mg  10 mg Rectal Daily Gina L Collins, PA-C      . cefUROXime (ZINACEF) 1.5 g in dextrose 5 % 50 mL IVPB  1.5 g Intravenous Q12H Gina L Collins, PA-C   1.5 g at 09/13/12 0409  . cholestyramine (QUESTRAN) packet 1 packet  1 packet Oral BID WC Robbie Lis, PA-C   1 packet at 09/13/12 0931  . clopidogrel (PLAVIX) tablet 75 mg  75 mg Oral Q breakfast Loreli Slot, MD   75 mg at 09/13/12 0931  . dexmedetomidine (PRECEDEX) 200 MCG/50ML infusion  0.1-0.7 mcg/kg/hr Intravenous Continuous Wilmon Pali, PA-C   0.1 mcg/kg/hr at 09/13/12 0400  . docusate sodium (COLACE) capsule 200 mg  200 mg Oral Daily Wilmon Pali, PA-C   200 mg at 09/13/12 5409  . enoxaparin (LOVENOX) injection 40 mg  40 mg Subcutaneous QHS Loreli Slot, MD      . ezetimibe (ZETIA) tablet 10 mg  10 mg Oral q1800 Brittainy Simmons, PA-C   10 mg at 09/11/12 1743  . insulin aspart (novoLOG) injection 0-24 Units  0-24 Units Subcutaneous Q4H Loreli Slot, MD   2 Units at 09/13/12 [phone]  . insulin detemir (LEVEMIR) injection  20 Units  20 Units Subcutaneous Q1200 Loreli Slot, MD      . lactated ringers infusion   Intravenous Continuous Wilmon Pali, PA-C 20 mL/hr at 09/12/12 1200    . loratadine (CLARITIN) tablet 10 mg  10 mg Oral Daily Wilburt Finlay, PA-C   10 mg at 09/13/12 0939  . metoprolol (LOPRESSOR) injection 2.5-5 mg  2.5-5 mg Intravenous Q2H PRN Wilmon Pali, PA-C      . metoprolol tartrate (LOPRESSOR) tablet 12.5 mg  12.5 mg Oral BID Wilmon Pali, PA-C       Or  . metoprolol tartrate (LOPRESSOR) 25 mg/10 mL oral suspension 12.5 mg  12.5 mg Per Tube BID Wilmon Pali, PA-C      . midazolam (VERSED) injection 2 mg  2 mg Intravenous Q1H PRN Wilmon Pali, PA-C      . morphine 2 MG/ML injection 2-5 mg  2-5 mg Intravenous Q1H PRN Wilmon Pali, PA-C   4 mg at 09/13/12 0854  . nitroGLYCERIN 0.2 mg/mL in dextrose 5 % infusion  0-100 mcg/min Intravenous Continuous Gina L Collins,  PA-C      . ondansetron (ZOFRAN) injection 4 mg  4 mg Intravenous Q6H PRN Wilmon Pali, PA-C      . oxyCODONE (Oxy IR/ROXICODONE) immediate release tablet 5-10 mg  5-10 mg Oral Q3H PRN Wilmon Pali, PA-C      . [START ON 09/14/2012] pantoprazole (PROTONIX) EC tablet 40 mg  40 mg Oral Daily Gina L Collins, PA-C      . phenylephrine (NEO-SYNEPHRINE) 20,000 mcg in dextrose 5 % 250 mL infusion  0-100 mcg/min Intravenous Continuous Wilmon Pali, PA-C 15 mL/hr at 09/13/12 0535 20 mcg/min at 09/13/12 0535  . sodium chloride 0.9 % injection 3 mL  3 mL Intravenous Q12H Wilmon Pali, PA-C   3 mL at 09/13/12 0938  . sodium chloride 0.9 % injection 3 mL  3 mL Intravenous PRN Wilmon Pali, PA-C        PE: Lungs: clear to auscultation bilaterally Heart: regular rate and rhythm, S1, S2 normal, no murmur, click, rub or gallop Extremities: Trace right LEE.  None on the left. Pulses: 2+ and symmetric  Lab Results:   Recent Labs  09/12/12 1145  09/12/12 1745 09/12/12 1746 09/13/12 0405  WBC 10.3  --  12.4*  --  14.8*  HGB 11.9*  < > 11.9* 12.2* 11.5*  HCT 34.3*  < > 34.5* 36.0* 33.2*  PLT 142*  --  152  --  141*  < > = values in this interval not displayed. BMET  Recent Labs  09/12/12 1149 09/12/12 1745 09/12/12 1746 09/13/12 0405  NA 140  --  137 134*  K 4.3  --  4.6 5.1  CL  --   --  108 102  CO2  --   --   --  24  GLUCOSE 134*  --  118* 140*  BUN  --   --  11 15  CREATININE  --  0.99 0.90 1.14  CALCIUM  --   --   --  8.3*   PT/INR  Recent Labs  09/12/12 1145  LABPROT 13.0  INR 0.99    Assessment/Plan   Principal Problem:   Unstable angina Active Problems:   HYPERLIPIDEMIA   TOBACCO ABUSE   BRONCHITIS, CHRONIC- finnished course of ABs and steroids 4/19   CAD- prior LAD and RCA stenting ('97 and '98) CFX stent '04  Elevated LFTs    EKG abnormality- transient TWI   PVD- Lt SFA stent '04, Rt SFA stent '08   CAD (coronary artery disease), native coronary artery  - Severe Ostial & Proximal LAD ~90%; Ostial Circumflex ostial ~60%; Plan CABG  Plan:  POD #1 CABG x 2(LIMA to LAD, SVG to Cx).  Doing well.  Off pressors with stable BP. ASA, lipitior, plavix, Zetia, low dose.  lopressor.   LOS: 8 days    HAGER, BRYAN 09/13/2012 10:31 AM  I have seen and evaluated the patient this AM along with Wilburt Finlay, PA. I agree with his findings, examination as well as impression recommendations.  Looks well POD#1 -- pressors off, on low dose BB. Only notes chest soreness, otherwise stable.  BP&HR stable on current medications.  No bleed on Plavix that was restarted for RCA stent. On statin & zetia & Questran.  Will monitor, anticipate Txfr to tele today & d/c soon.   Marykay Lex, M.D., M.S. [facility] [address] [place], [place]  [address]  [phone] Pager # [phone] 09/13/2012 10:57 AM

## 2012-09-13 NOTE — Op Note (Signed)
NAMEMARIUS, BETTS NO.:  [id number]  MEDICAL RECORD NO.:  [id number]  LOCATION:  2301                         FACILITY:  [facility]  PHYSICIAN:  Salvatore Decent. Dorris Fetch, M.D.DATE OF BIRTH:  [birth year]  DATE OF PROCEDURE:  09/12/2012 DATE OF DISCHARGE:                              OPERATIVE REPORT   PREOPERATIVE DIAGNOSIS:  Severe two-vessel coronary disease with unstable angina.  POSTOPERATIVE DIAGNOSIS:  Severe two-vessel coronary disease with unstable angina.  PROCEDURE:  Median sternotomy, off pump coronary artery bypass grafting x2 (left internal mammary artery to left anterior descending, saphenous vein graft to obtuse marginal 1).  SURGEON:  Loreli Slot, MD.  ASSISTANT:  Coral Ceo, PA.  ANESTHESIA:  General.  FINDINGS:  Good quality targets, good quality conduits.  CLINICAL NOTE:  This patient is a 67 year old gentleman with a long history of coronary disease and multiple previous angioplasties.  He presented following an episode of prolonged chest pain.  He was admitted and ruled out for myocardial infarction, but a Cardiolite was positive. He underwent cardiac catheterization which revealed severe two-vessel disease with critical disease in the proximal LAD as well as 60% to 70% stenosis in the circumflex.  The stent to the right coronary was widely patent, and they were no hemodynamically significant stenoses in the right coronary circulation.  The patient was advised to undergo coronary artery bypass grafting.  The indications, risks, benefits, and alternatives were discussed in detail with the patient.  He understood and accepted the risks and agreed to proceed.  OPERATIVE NOTE:  This patient was brought to the preoperative holding area on September 12, 2012, there Anesthesia placed a Swan-Ganz catheter and arterial blood pressure monitoring line.  Intravenous antibiotics were administered.  He was taken to the operating room,  anesthetized, and intubated.  A Foley catheter was placed.  The chest, abdomen, and legs were prepped and draped in usual sterile fashion.  Incision made in the medial aspect of the right leg at the level of the knee.  The greater saphenous vein was harvested endoscopically. 2000 units of heparin was administered during the vessel harvest.  Simultaneously with this, a median sternotomy was performed and the left internal mammary artery was harvested using standard technique.  The left mammary artery and saphenous vein were good quality.  After the conduits have been harvested, the remainder of the full heparin dose was given. Anticoagulation was confirmed with ACT measurement of greater than 300. The patient was placed in Trendelenburg position and the table tipped slightly to the patient's right.  Deep pericardial stay sutures were placed and covered with Rumel tourniquets to prevent epicardial laceration. Traction on these brought the apex of the heart anteriorly.  The Maquet off-pump grafting device was used to expose the left circumflex. The suction cup was placed on the apex of the left ventricle and retracted to the right and then the stabilizer was placed at the site chosen for the anastomosis. Silastic tapes were placed proimally ans distally to the anastomotic site. There was transient hypotension with positioning, but recovered nicely with Neo-Synephrine to maintain adequate blood pressure throughout.  The distal end of the vein graft was Beveled. An arteriotomy was made in  the first obtuse marginal.  Traction was placed on silastic tapes proximal and distal to the anastomosis to achieve hemostasis.  An end-to-side anastomosis then was performed with a running 7-0 Prolene suture.  The anastomosis was de-aired and the silastic tapes were released before tying the suture.  The graft flushed easily.  The stabilizer was removed as well as the apical suction cup. The heart was allowed  to rest back in the pericardium in its normal location.  The vein graft was cut to length.  A heartstring 3 proximal anastomotic device was used.  A 3.8-mm punch aortotomy was made in the aorta.  The heartstring device was deployed within the aorta and there was acceptable hemostasis.  The proximal anastomosis for the vein graft then was performed to this arteriotomy with a running 6-0 Prolene suture.  At the completion of the anastomosis, the heartstring occlusion device was removed, de-airing the anastomosis as it was taken out.  The suture was tied.  There was no residual air in the vein graft.  Next, the left internal mammary artery was brought through a window in the pericardium.  The distal end was beveled.  This was a 2-mm good quality conduit with excellent flow.  Traction was applied on the deep pericardial stay sutures, bringing the apex of the heart anteriorly. This allowed adequate exposure of the LAD without requiring use of the apical suction.  The stabilizer was placed at the site chosen for the anastomosis.  Silastic tapes were placed proximally and distally, and an arteriotomy was made.  Gentle traction was applied on the silastic tapes.  The LAD was a 2-mm good quality conduit.  The mammary to LAD anastomosis was performed end-to-side with a running 8-0 Prolene suture. At the completion of the anastomosis, the bulldog clamp was removed from the mammary artery and the tapes were loosened bith proximally and distally.  After the anastomosis was de-aired, the suture was tied.  There was good hemostasis at the anastomosis.  The mammary pedicle was tacked to the epicardial surface of the heart with 6-0 Prolene sutures.  Final inspection of all anastomoses was made for hemostasis.  The deep pericardial stay sutures were removed.  Epicardial pacing wires were placed on the right ventricle and right atrium.  The chest was copiously irrigated with warm saline.  A test dose of  protamine was administered and was well tolerated.  The remainder of the protamine was administered without incident.  Hemostasis was achieved.  The pericardium was loosely reapproximated over the ascending aorta and base of the heart with interrupted 3-0 silk sutures.  A 32-French Blake drains were placed in the left pleural space and the mediastinum through separate subcostal incisions.  The sternum was closed with interrupted heavy gauge stainless steel wires.  The pectoralis fascia, subcutaneous tissue, and skin were closed in standard fashion as were the leg incisions. All sponge, Needle, and instrument counts were correct at the end of the procedure.  The patient was taken from the operating room to the surgical intensive care unit in good condition.     Salvatore Decent Dorris Fetch, M.D.     SCH/MEDQ  D:  09/12/2012  T:  09/13/2012  Job:  [id number]

## 2012-09-14 ENCOUNTER — Encounter (HOSPITAL_COMMUNITY): Payer: Self-pay | Admitting: Thoracic Surgery (Cardiothoracic Vascular Surgery)

## 2012-09-14 ENCOUNTER — Inpatient Hospital Stay (HOSPITAL_COMMUNITY): Payer: Medicare Other

## 2012-09-14 LAB — GLUCOSE, CAPILLARY
Glucose-Capillary: 128 mg/dL — ABNORMAL HIGH (ref 70–99)
Glucose-Capillary: 121 mg/dL — ABNORMAL HIGH (ref 70–99)
Glucose-Capillary: 125 mg/dL — ABNORMAL HIGH (ref 70–99)
Glucose-Capillary: 131 mg/dL — ABNORMAL HIGH (ref 70–99)
Glucose-Capillary: 134 mg/dL — ABNORMAL HIGH (ref 70–99)
Glucose-Capillary: 126 mg/dL — ABNORMAL HIGH (ref 70–99)
Glucose-Capillary: 155 mg/dL — ABNORMAL HIGH (ref 70–99)

## 2012-09-14 LAB — CBC
HCT: 33.6 % — ABNORMAL LOW (ref 39.0–52.0)
Hemoglobin: 11.4 g/dL — ABNORMAL LOW (ref 13.0–17.0)
MCH: 28.8 pg (ref 26.0–34.0)
MCHC: 33.9 g/dL (ref 30.0–36.0)
MCV: 84.8 fL (ref 78.0–100.0)
Platelets: 125 10*3/uL — ABNORMAL LOW (ref 150–400)
RBC: 3.96 MIL/uL — ABNORMAL LOW (ref 4.22–5.81)
RDW: 14.5 % (ref 11.5–15.5)
WBC: 13.3 10*3/uL — ABNORMAL HIGH (ref 4.0–10.5)

## 2012-09-14 LAB — BASIC METABOLIC PANEL
BUN: 16 mg/dL (ref 6–23)
CO2: 27 mEq/L (ref 19–32)
Calcium: 9.1 mg/dL (ref 8.4–10.5)
Chloride: 96 mEq/L (ref 96–112)
Creatinine, Ser: 0.98 mg/dL (ref 0.50–1.35)
GFR calc Af Amer: >90 mL/min (ref >90)
GFR calc non Af Amer: 84 mL/min — ABNORMAL LOW (ref >90)
Glucose, Bld: 128 mg/dL — ABNORMAL HIGH (ref 70–99)
Potassium: 4.7 mEq/L (ref 3.5–5.1)
Sodium: 130 mEq/L — ABNORMAL LOW (ref 135–145)

## 2012-09-14 MED ORDER — GUAIFENESIN ER 600 MG PO TB12
600.0000 mg | ORAL_TABLET | Freq: Two times a day (BID) | ORAL | Status: DC | PRN
  Administered 2012-09-14: 600 mg via ORAL
  Filled 2012-09-14: qty 1

## 2012-09-14 MED ORDER — AMIODARONE LOAD VIA INFUSION
150.0000 mg | Freq: Once | INTRAVENOUS | Status: AC
  Administered 2012-09-14: 150 mg via INTRAVENOUS
  Filled 2012-09-14: qty 83.34

## 2012-09-14 MED ORDER — AMIODARONE HCL IN DEXTROSE 360-4.14 MG/200ML-% IV SOLN
30.0000 mg/h | INTRAVENOUS | Status: AC
  Administered 2012-09-15 (×2): 30 mg/h via INTRAVENOUS
  Filled 2012-09-14 (×4): qty 200

## 2012-09-14 MED ORDER — ZOLPIDEM TARTRATE 5 MG PO TABS: 5.0000 mg | ORAL_TABLET | Freq: Every evening | ORAL | Status: DC | PRN

## 2012-09-14 MED ORDER — MAGNESIUM HYDROXIDE 400 MG/5ML PO SUSP: 30.0000 mL | Freq: Every day | ORAL | Status: DC | PRN

## 2012-09-14 MED ORDER — METOPROLOL TARTRATE 1 MG/ML IV SOLN
5.0000 mg | Freq: Once | INTRAVENOUS | Status: AC
  Administered 2012-09-14: 5 mg via INTRAVENOUS

## 2012-09-14 MED ORDER — ALPRAZOLAM 0.25 MG PO TABS
0.2500 mg | ORAL_TABLET | Freq: Four times a day (QID) | ORAL | Status: DC | PRN
  Administered 2012-09-14: 0.25 mg via ORAL
  Filled 2012-09-14: qty 1

## 2012-09-14 MED ORDER — METOPROLOL TARTRATE 1 MG/ML IV SOLN
INTRAVENOUS | Status: AC
  Filled 2012-09-14: qty 5

## 2012-09-14 MED ORDER — AMIODARONE HCL IN DEXTROSE 360-4.14 MG/200ML-% IV SOLN
60.0000 mg/h | INTRAVENOUS | Status: AC
  Administered 2012-09-14 (×2): 60 mg/h via INTRAVENOUS
  Filled 2012-09-14 (×2): qty 200

## 2012-09-14 MED ORDER — METOCLOPRAMIDE HCL 5 MG/ML IJ SOLN
10.0000 mg | Freq: Four times a day (QID) | INTRAMUSCULAR | Status: AC
  Administered 2012-09-14 (×4): 10 mg via INTRAVENOUS
  Filled 2012-09-14 (×4): qty 2

## 2012-09-14 MED ORDER — MOVING RIGHT ALONG BOOK
Freq: Once | Status: AC
  Administered 2012-09-14: 1
  Filled 2012-09-14: qty 1

## 2012-09-14 MED ORDER — SODIUM CHLORIDE 0.9 % IV SOLN
250.0000 mL | INTRAVENOUS | Status: DC | PRN
  Administered 2012-09-15: 250 mL via INTRAVENOUS

## 2012-09-14 MED ORDER — SODIUM CHLORIDE 0.9 % IJ SOLN: 3.0000 mL | INTRAMUSCULAR | Status: DC | PRN

## 2012-09-14 MED ORDER — ALUM & MAG HYDROXIDE-SIMETH 200-200-20 MG/5ML PO SUSP
15.0000 mL | ORAL | Status: DC | PRN
Start: 2012-09-14 — End: 2012-09-18

## 2012-09-14 MED ORDER — SODIUM CHLORIDE 0.9 % IJ SOLN
3.0000 mL | Freq: Two times a day (BID) | INTRAMUSCULAR | Status: DC
  Administered 2012-09-14 – 2012-09-17 (×8): 3 mL via INTRAVENOUS

## 2012-09-14 MED ORDER — AMIODARONE IV BOLUS ONLY 150 MG/100ML
150.0000 mg | Freq: Once | INTRAVENOUS | Status: DC
  Filled 2012-09-14 (×2): qty 100

## 2012-09-14 MED ORDER — INSULIN ASPART 100 UNIT/ML ~~LOC~~ SOLN
0.0000 [IU] | Freq: Three times a day (TID) | SUBCUTANEOUS | Status: DC
  Administered 2012-09-14 – 2012-09-17 (×7): 2 [IU] via SUBCUTANEOUS

## 2012-09-14 MED ORDER — POTASSIUM CHLORIDE CRYS ER 20 MEQ PO TBCR
20.0000 meq | EXTENDED_RELEASE_TABLET | Freq: Every day | ORAL | Status: DC
  Administered 2012-09-14 – 2012-09-18 (×5): 20 meq via ORAL
  Filled 2012-09-14 (×5): qty 1

## 2012-09-14 MED ORDER — FUROSEMIDE 40 MG PO TABS
40.0000 mg | ORAL_TABLET | Freq: Every day | ORAL | Status: DC
  Administered 2012-09-14 – 2012-09-17 (×4): 40 mg via ORAL
  Filled 2012-09-14 (×5): qty 1

## 2012-09-14 MED FILL — Potassium Chloride Inj 2 mEq/ML: INTRAVENOUS | Qty: 40 | Status: AC

## 2012-09-14 MED FILL — Magnesium Sulfate Inj 50%: INTRAMUSCULAR | Qty: 10 | Status: AC

## 2012-09-14 MED FILL — Sodium Chloride IV Soln 0.9%: INTRAVENOUS | Qty: 250 | Status: AC

## 2012-09-14 MED FILL — Vancomycin HCl For IV Soln 1 GM (Base Equivalent): INTRAVENOUS | Qty: 1500 | Status: AC

## 2012-09-14 NOTE — Progress Notes (Signed)
[facility]  Subjective: No complaints.  Objective: Vital signs in last 24 hours: Temp:  [97.4 F (36.3 C)-99.2 F (37.3 C)] 98.3 F (36.8 C) (04/30 0832) Pulse Rate:  [80-101] 91 (04/30 1000) Resp:  [14-30] 22 (04/30 1000) BP: (110-140)/(63-87) 123/72 mmHg (04/30 0900) SpO2:  [92 %-98 %] 97 % (04/30 1000) Weight:  [205 lb 4 oz (93.1 kg)] 205 lb 4 oz (93.1 kg) (04/30 0600) Last BM Date: 09/12/12  Intake/Output from previous day: 04/29 0701 - 04/30 0700 In: 1582.6 [P.O.:720; I.V.:562.6; IV Piggyback:300] Out: 1365 [Urine:1315; Chest Tube:50] Intake/Output this shift: Total I/O In: 280 [P.O.:240; I.V.:40] Out: -   Medications Current Facility-Administered Medications  Medication Dose Route Frequency Provider Last Rate Last Dose  . 0.45 % sodium chloride infusion   Intravenous Continuous Wilmon Pali, PA-C 20 mL/hr at 09/13/12 1741 20 mL/hr at 09/13/12 1741  . 0.9 %  sodium chloride infusion   Intravenous Continuous Wilmon Pali, PA-C 20 mL/hr at 09/12/12 1200    . 0.9 %  sodium chloride infusion  250 mL Intravenous Continuous Wilmon Pali, PA-C      . acetaminophen (TYLENOL) tablet 1,000 mg  1,000 mg Oral Q6H Wilmon Pali, PA-C   1,000 mg at 09/14/12 4098   Or  . acetaminophen (TYLENOL) solution 975 mg  975 mg Per Tube Q6H Gina L Collins, PA-C      . aspirin EC tablet 325 mg  325 mg Oral Daily Wilmon Pali, PA-C   325 mg at 09/14/12 1191   Or  . aspirin chewable tablet 324 mg  324 mg Per Tube Daily Gina L Collins, PA-C      . atorvastatin (LIPITOR) tablet 80 mg  80 mg Oral q1800 Lennette Bihari, MD   80 mg at 09/13/12 1725  . bisacodyl (DULCOLAX) EC tablet 10 mg  10 mg Oral Daily Wilmon Pali, PA-C   10 mg at 09/13/12 4782   Or  . bisacodyl (DULCOLAX) suppository 10 mg  10 mg Rectal Daily Wilmon Pali, PA-C      . cholestyramine Lanetta Inch) packet 1 packet  1 packet Oral BID WC Brittainy Simmons, PA-C   1 packet at 09/14/12 0900  .  clopidogrel (PLAVIX) tablet 75 mg  75 mg Oral Q breakfast Loreli Slot, MD   75 mg at 09/14/12 0908  . dexmedetomidine (PRECEDEX) 200 MCG/50ML infusion  0.1-0.7 mcg/kg/hr Intravenous Continuous Wilmon Pali, PA-C   0.1 mcg/kg/hr at 09/13/12 0400  . docusate sodium (COLACE) capsule 200 mg  200 mg Oral Daily Wilmon Pali, PA-C   200 mg at 09/14/12 0908  . enoxaparin (LOVENOX) injection 40 mg  40 mg Subcutaneous QHS Loreli Slot, MD   40 mg at 09/13/12 2134  . ezetimibe (ZETIA) tablet 10 mg  10 mg Oral q1800 Brittainy Simmons, PA-C   10 mg at 09/13/12 1725  . furosemide (LASIX) tablet 40 mg  40 mg Oral Daily Loreli Slot, MD   40 mg at 09/14/12 9562  . insulin aspart (novoLOG) injection 0-15 Units  0-15 Units Subcutaneous TID WC Loreli Slot, MD   2 Units at 09/14/12 0930  . insulin detemir (LEVEMIR) injection 10 Units  10 Units Subcutaneous Q1200 Loreli Slot, MD   10 Units at 09/13/12 1511  . lactated ringers infusion   Intravenous Continuous Wilmon Pali, PA-C 20 mL/hr at 09/12/12 1200    . loratadine (CLARITIN) tablet 10  mg  10 mg Oral Daily Wilburt Finlay, PA-C   10 mg at 09/14/12 0908  . metoCLOPramide (REGLAN) injection 10 mg  10 mg Intravenous Q6H Loreli Slot, MD   10 mg at 09/14/12 0950  . metoprolol (LOPRESSOR) injection 2.5-5 mg  2.5-5 mg Intravenous Q2H PRN Wilmon Pali, PA-C      . metoprolol tartrate (LOPRESSOR) tablet 12.5 mg  12.5 mg Oral BID Wilmon Pali, PA-C   12.5 mg at 09/14/12 1610   Or  . metoprolol tartrate (LOPRESSOR) 25 mg/10 mL oral suspension 12.5 mg  12.5 mg Per Tube BID Wilmon Pali, PA-C      . midazolam (VERSED) injection 2 mg  2 mg Intravenous Q1H PRN Wilmon Pali, PA-C      . morphine 2 MG/ML injection 2-5 mg  2-5 mg Intravenous Q1H PRN Wilmon Pali, PA-C   4 mg at 09/14/12 9604  . nitroGLYCERIN 0.2 mg/mL in dextrose 5 % infusion  0-100 mcg/min Intravenous Continuous Gina L Collins, PA-C      . ondansetron  (ZOFRAN) injection 4 mg  4 mg Intravenous Q6H PRN Wilmon Pali, PA-C   4 mg at 09/13/12 1302  . oxyCODONE (Oxy IR/ROXICODONE) immediate release tablet 5-10 mg  5-10 mg Oral Q3H PRN Wilmon Pali, PA-C   5 mg at 09/14/12 0911  . pantoprazole (PROTONIX) EC tablet 40 mg  40 mg Oral Daily Wilmon Pali, PA-C   40 mg at 09/14/12 0911  . phenylephrine (NEO-SYNEPHRINE) 20,000 mcg in dextrose 5 % 250 mL infusion  0-100 mcg/min Intravenous Continuous Wilmon Pali, PA-C 3.8 mL/hr at 09/13/12 1000 5 mcg/min at 09/13/12 1000  . potassium chloride SA (K-DUR,KLOR-CON) CR tablet 20 mEq  20 mEq Oral Daily Loreli Slot, MD   20 mEq at 09/14/12 5409  . sodium chloride 0.9 % injection 3 mL  3 mL Intravenous Q12H Gina L Collins, PA-C   3 mL at 09/14/12 1000  . sodium chloride 0.9 % injection 3 mL  3 mL Intravenous PRN Wilmon Pali, PA-C        PE: Lungs: clear to auscultation bilaterally  Heart: regular rate and rhythm, S1, S2 normal, no murmur, click, rub or gallop  Extremities: Trace right LEE. None on the left.  Pulses: 2+ and symmetric  Lab Results:   Recent Labs  09/13/12 0405 09/13/12 1700 09/13/12 1724 09/14/12 0500  WBC 14.8* 13.3*  --  13.3*  HGB 11.5* 11.4* 11.9* 11.4*  HCT 33.2* 33.3* 35.0* 33.6*  PLT 141* 123*  --  125*   BMET  Recent Labs  09/13/12 0405 09/13/12 1700 09/13/12 1724 09/14/12 0500  NA 134*  --  133* 130*  K 5.1  --  4.5 4.7  CL 102  --  98 96  CO2 24  --   --  27  GLUCOSE 140*  --  139* 128*  BUN 15  --  15 16  CREATININE 1.14 1.02 1.00 0.98  CALCIUM 8.3*  --   --  9.1   PT/INR  Recent Labs  09/12/12 1145  LABPROT 13.0  INR 0.99     Assessment/Plan  Principal Problem:   Unstable angina Active Problems:   HYPERLIPIDEMIA   TOBACCO ABUSE   BRONCHITIS, CHRONIC- finnished course of ABs and steroids 4/19   CAD- prior LAD and RCA stenting ('97 and '98) CFX stent '04   Elevated LFTs    EKG abnormality- transient TWI  PVD- Lt SFA stent  '04, Rt SFA stent '08   CAD (coronary artery disease), native coronary artery - Severe Ostial & Proximal LAD ~90%; Ostial Circumflex ostial ~60%; Plan CABG  Plan: POD #2 CABG x 2(LIMA to LAD, SVG to Cx). Doing well. Off pressors with stable BP. ASA, lipitior, plavix, Zetia, low dose. Lopressor.  He is transferring to 2000.  BP an HR stable.     LOS: 9 days    Brian Brock 09/14/2012 10:48 AM

## 2012-09-14 NOTE — Progress Notes (Signed)
Patient transferred to room 2038 via wheelchair to chair. All belongings and meds sent with patient and given to receiving RN, Vernona Rieger. Family updated and made aware of transfer. No acute distress noted. Safety maintained. Denies pain.

## 2012-09-14 NOTE — Progress Notes (Signed)
2 Days Post-Op Procedure(s) (LRB): OFF PUMP CORONARY ARTERY BYPASS GRAFTING (CABG) (N/A) Subjective: Feels better today Mild incisional pain Denies nausea  Objective: Vital signs in last 24 hours: Temp:  [97.4 F (36.3 Brock)-99.2 F (37.3 Brock)] 98.8 F (37.1 Brock) (04/30 0400) Pulse Rate:  [79-101] 89 (04/30 0700) Cardiac Rhythm:  [-] Normal sinus rhythm (04/30 0600) Resp:  [14-30] 16 (04/30 0700) BP: (104-140)/(57-87) 114/65 mmHg (04/30 0700) SpO2:  [92 %-98 %] 92 % (04/30 0700) Arterial Line BP: (75-77)/(62-65) 77/65 mmHg (04/29 0900) Weight:  [205 lb 4 oz (93.1 kg)] 205 lb 4 oz (93.1 kg) (04/30 0600)  Hemodynamic parameters for last 24 hours: PAP: (39-42)/(21-24) 39/21 mmHg  Intake/Output from previous day: 04/29 0701 - 04/30 0700 In: 1562.6 [P.O.:720; I.V.:542.6; IV Piggyback:300] Out: 1365 [Urine:1315; Chest Tube:50] Intake/Output this shift:    General appearance: alert and no distress Neurologic: intact Heart: regular rate and rhythm Lungs: diminished breath sounds bibasilar Abdomen: mildly distended, nontender Wound: clean and dry  Lab Results:  Recent Labs  09/13/12 1700 09/13/12 1724 09/14/12 0500  WBC 13.3*  --  13.3*  HGB 11.4* 11.9* 11.4*  HCT 33.3* 35.0* 33.6*  PLT 123*  --  125*   BMET:  Recent Labs  09/13/12 0405  09/13/12 1724 09/14/12 0500  NA 134*  --  133* 130*  K 5.1  --  4.5 4.7  CL 102  --  98 96  CO2 24  --   --  27  GLUCOSE 140*  --  139* 128*  BUN 15  --  15 16  CREATININE 1.14  < > 1.00 0.98  CALCIUM 8.3*  --   --  9.1  < > = values in this interval not displayed.  PT/INR:  Recent Labs  09/12/12 1145  LABPROT 13.0  INR 0.99   ABG    Component Value Date/Time   PHART 7.346* 09/12/2012 1859   HCO3 23.8 09/12/2012 1859   TCO2 27 09/13/2012 1724   ACIDBASEDEF 2.0 09/12/2012 1859   O2SAT 90.0 09/12/2012 1859   CBG (last 3)   Recent Labs  09/13/12 1941 09/13/12 2327 09/14/12 0358  GLUCAP 167* 121* 125*     Assessment/Plan: S/P Procedure(s) (LRB): OFF PUMP CORONARY ARTERY BYPASS GRAFTING (CABG) (N/A) Plan for transfer to step-down: see transfer orders CV- stable  RESP- bibasilar atelectasis L>R- pulmonary hygiene  RENAL- lytes and creatinine OK- wt up will diurese  ENDO- CBG mildly elevated- continue levimir for now  GI- gastric dilatation on CXR- reglan x 24 hours   LOS: 9 days    Brian Brock 09/14/2012

## 2012-09-14 NOTE — Progress Notes (Signed)
Verbal-phone report given to "Vernona Rieger, RN" on unit 2000. Patient to be transferred to room 2038. Family made aware of transfer. Central line DC'd in ICU as ordered. No acute distress noted. VSS. A/O x 4. Safety maintained.

## 2012-09-14 NOTE — Progress Notes (Signed)
I have seen and evaluated the patient this AM along with Wilburt Finlay, PA. I agree with his findings, examination as well as impression recommendations.  Looks good POD #2.  BP & HR stable.  No gross volume overload -- on low dose Lasix. No active issues -- no CP, arrhythmia or dyspnea. On aggressive Lipid regimen.  Back on ASA/Plavix for RCA stent.  With transfer to Tele today, anticipate d/c in the next few days.  Marykay Lex, M.D., M.S. [facility] [address] [place], [place]  [address]  [phone] Pager # [phone] 09/14/2012 11:10 AM

## 2012-09-14 NOTE — Progress Notes (Signed)
Dr. Laneta Simmers was notified after patient and family member verbalized the patient didn't feel well and according to remote monitoring patient's heart rate was sustaining 202. Immediately patient was returned to bed and an  EKG was performed. Md gave orders to give bolus of amiodarone and start a continuous drip. Patient's heart rate decreased slightly but continued to verbalize discomfort. Md. Laneta Simmers was notified once more and an order to give 5mg  of lopressor was given. Patient's heart rate decreased to 120's-140's. Oncoming nurse was instructed to call doctor again if the patient continues to look and feel bad and sustain elevated heart rates.

## 2012-09-14 NOTE — Progress Notes (Signed)
09/14/2012 1855 Nursing note Pt. HR still Afib rate 170 and sustaining after ordered amiodarone bolus and drip initiation. Pt. States he does not feel well. BP 115/79.  o2 saturation 93% on 4L [place]. Dr. Laneta Simmers paged and made aware. Verbal orders received and enacted for metoprolol 5mg  IV x1.  Pt. Tolerated well. BP after Metoprolol administration 107/69 HR 143. Will continue to closely monitor patient.  Walsie Smeltz, Blanchard Kelch

## 2012-09-15 ENCOUNTER — Inpatient Hospital Stay (HOSPITAL_COMMUNITY): Payer: Medicare Other

## 2012-09-15 LAB — CBC
HCT: 33.2 % — ABNORMAL LOW (ref 39.0–52.0)
Hemoglobin: 11.3 g/dL — ABNORMAL LOW (ref 13.0–17.0)
MCH: 28.5 pg (ref 26.0–34.0)
MCHC: 34 g/dL (ref 30.0–36.0)
MCV: 83.6 fL (ref 78.0–100.0)
Platelets: 122 10*3/uL — ABNORMAL LOW (ref 150–400)
RBC: 3.97 MIL/uL — ABNORMAL LOW (ref 4.22–5.81)
RDW: 14 % (ref 11.5–15.5)
WBC: 8 10*3/uL (ref 4.0–10.5)

## 2012-09-15 LAB — BASIC METABOLIC PANEL
BUN: 15 mg/dL (ref 6–23)
CO2: 27 mEq/L (ref 19–32)
Calcium: 9 mg/dL (ref 8.4–10.5)
Chloride: 96 mEq/L (ref 96–112)
Creatinine, Ser: 0.92 mg/dL (ref 0.50–1.35)
GFR calc Af Amer: >90 mL/min (ref >90)
GFR calc non Af Amer: 86 mL/min — ABNORMAL LOW (ref >90)
Glucose, Bld: 125 mg/dL — ABNORMAL HIGH (ref 70–99)
Potassium: 4.2 mEq/L (ref 3.5–5.1)
Sodium: 134 mEq/L — ABNORMAL LOW (ref 135–145)

## 2012-09-15 LAB — GLUCOSE, CAPILLARY
Glucose-Capillary: 115 mg/dL — ABNORMAL HIGH (ref 70–99)
Glucose-Capillary: 139 mg/dL — ABNORMAL HIGH (ref 70–99)
Glucose-Capillary: 103 mg/dL — ABNORMAL HIGH (ref 70–99)

## 2012-09-15 MED ORDER — AMIODARONE LOAD VIA INFUSION
150.0000 mg | Freq: Once | INTRAVENOUS | Status: AC
  Administered 2012-09-15: 150 mg via INTRAVENOUS
  Filled 2012-09-15: qty 83.34

## 2012-09-15 MED ORDER — AMIODARONE IV BOLUS ONLY 150 MG/100ML: 150.0000 mg | Freq: Once | INTRAVENOUS | Status: DC

## 2012-09-15 MED ORDER — LOSARTAN POTASSIUM 50 MG PO TABS
50.0000 mg | ORAL_TABLET | Freq: Every day | ORAL | Status: DC
  Administered 2012-09-15 – 2012-09-18 (×4): 50 mg via ORAL
  Filled 2012-09-15 (×4): qty 1

## 2012-09-15 MED ORDER — AMIODARONE HCL 200 MG PO TABS: 200.0000 mg | ORAL_TABLET | Freq: Two times a day (BID) | ORAL | Status: DC

## 2012-09-15 MED ORDER — METOPROLOL TARTRATE 25 MG PO TABS
25.0000 mg | ORAL_TABLET | Freq: Two times a day (BID) | ORAL | Status: DC
  Administered 2012-09-15 – 2012-09-17 (×5): 25 mg via ORAL
  Filled 2012-09-15 (×8): qty 1

## 2012-09-15 MED ORDER — AMIODARONE HCL 200 MG PO TABS
400.0000 mg | ORAL_TABLET | Freq: Two times a day (BID) | ORAL | Status: DC
  Administered 2012-09-16 – 2012-09-18 (×5): 400 mg via ORAL
  Filled 2012-09-15 (×7): qty 2

## 2012-09-15 NOTE — Progress Notes (Addendum)
[address]            [place] [address]          [phone]     3 Days Post-Op Procedure(s) (LRB): OFF PUMP CORONARY ARTERY BYPASS GRAFTING (CABG) (N/A)  Subjective: Feels "rough" this am.  Went into AF last night with rapid rate.  Now back in SR on Amio.  Appetite ok, breathing stable.   Objective: Vital signs in last 24 hours: Patient Vitals for the past 24 hrs:  BP Temp Temp src Pulse Resp SpO2 Weight  09/15/12 0445 137/63 mmHg 98.7 F (37.1 C) Oral 83 19 94 % 216 lb 0.8 oz (98 kg)  09/14/12 2338 - - - - - 94 % -  09/14/12 2109 118/62 mmHg - - - - - -  09/14/12 2015 113/76 mmHg 99.3 F (37.4 C) - 148 22 92 % -  09/14/12 1930 116/71 mmHg 98.9 F (37.2 C) - 145 - - -  09/14/12 1910 107/65 mmHg - - 160 - - -  09/14/12 1900 115/79 mmHg - - 174 - - -  09/14/12 1830 142/82 mmHg 101 F (38.3 C) Oral 93 - - -  09/14/12 1700 150/77 mmHg 98.7 F (37.1 C) Oral 93 - 94 % -  09/14/12 1300 125/68 mmHg 98.5 F (36.9 C) Oral 89 19 99 % -  09/14/12 1000 - - - 91 22 97 % -  09/14/12 0900 123/72 mmHg - - 87 16 97 % -  09/14/12 0832 - 98.3 F (36.8 C) Oral - - - -   Current Weight  09/15/12 216 lb 0.8 oz (98 kg)  PRE-OPERATIVE WEIGHT: 87.5 kg    Intake/Output from previous day: 04/30 0701 - 05/01 0700 In: 520 [P.O.:480; I.V.:40] Out: 500 [Urine:500]   CBGs 126-155-125-115   PHYSICAL EXAM:  Heart: RRR Lungs: Diminished BS in bases bilaterally Wound: Clean and dry Extremities: Mild LE edema    Lab Results: CBC: Recent Labs  09/14/12 0500 09/15/12 0540  WBC 13.3* 8.0  HGB 11.4* 11.3*  HCT 33.6* 33.2*  PLT 125* 122*   BMET:  Recent Labs  09/14/12 0500 09/15/12 0540  NA 130* 134*  K 4.7 4.2  CL 96 96  CO2 27 27  GLUCOSE 128* 125*  BUN 16 15  CREATININE 0.98 0.92  CALCIUM 9.1 9.0    PT/INR:  Recent Labs  09/12/12 1145  LABPROT 13.0  INR 0.99    CXR: Findings:  Right jugular line removed.  Enlargement  of cardiac silhouette post CABG.  Stable slightly prominent mediastinum likely postsurgical.  Pulmonary vascularity normal.  Bibasilar effusions and atelectasis greater on left.  Upper lungs clear.  No pneumothorax or acute osseous findings.  IMPRESSION:  Persistent bibasilar effusions atelectasis greater on left.  Little interval change.    Assessment/Plan: S/P Procedure(s) (LRB): OFF PUMP CORONARY ARTERY BYPASS GRAFTING (CABG) (N/A)  CV- AF, now in SR.  Continue po Lopressor, IV Amio for now. Hopefully can switch to po Amio soon if he remains in SR.  BPs trending up and Cr stable.  Will restart ARB.  Vol overload- diurese.  Hyperglycemia- continue low dose Levemir.  A1C= 6.5.  Tmax=101 overnight, likely atelectasis.  Continue aggressive pulm toilet and watch.  WBC trending down.  CRPI.    LOS: 10 days    COLLINS,GINA H 09/15/2012  patient seen and examined. Agree  with above. He had rapid a fib last night, now in SR on amiodarone. Will convert amiodarone to PO this evening.

## 2012-09-15 NOTE — Progress Notes (Signed)
Pt just back into afib, rate 140. Notified RN, will not ambulate right now. Ethelda Chick CES, ACSM 1:01 PM. 09/15/2012

## 2012-09-15 NOTE — Progress Notes (Signed)
09/14/12 2050  Pt converted to NSR before second Amio bolus could be given. EKG obtained. Pt NSR HR 90 on monitor.  Pt reports feeling less short of breath and more comfortable in the bed.  VSS. Will continue to monitor. Tansy Lorek, Avie Echevaria , RN

## 2012-09-15 NOTE — Progress Notes (Signed)
Pt given 12.5mg  PO Metoprolol at this time; will cont. To monitor; HR down to 114.

## 2012-09-15 NOTE — Progress Notes (Signed)
Pt HR back into a-fib; rate 130's-140's; pt sitting up in chair at this time; wife in room; PA paged to make aware; BP stable; Amio gtt still in place; plan to stop gtt tonight and start PO Amio; will cont. To monitor.

## 2012-09-15 NOTE — Progress Notes (Signed)
Pt is exhausted from afib last night. Requests to rest. Will f/u after lunch. Ethelda Chick CES, ACSM

## 2012-09-15 NOTE — Progress Notes (Signed)
PA in room at this time; order for Amio bolus; bolus infusing at this time; also order to increase Metoprolol; additional 12.5mg  PO to be given now; pt sitting in chair; wife in room; will cont. To monitor.

## 2012-09-15 NOTE — Progress Notes (Signed)
09/14/12 2030  MD notified for HR 130-150s Afib on the monitor after Amio bolus and drip initiation and a 5mg  IV lopressor dose. Pt BP 113/76 with c/o feeling "fluttery", clammy, and SHOB. New order received for another Amio IV bolus.  Will continue to monitor. Shallyn Constancio, Avie Echevaria , RN

## 2012-09-15 NOTE — Progress Notes (Signed)
Pt now in SR; HR 78; Amio gtt in place; will cont. To monitor.

## 2012-09-15 NOTE — Progress Notes (Signed)
[facility]  Subjective: Was short of breath last PM when in a-fib. Feels better this AM.  Objective: Vital signs in last 24 hours: Temp:  [98.5 F (36.9 C)-101 F (38.3 C)] 98.7 F (37.1 C) (05/01 0445) Pulse Rate:  [83-174] 83 (05/01 0445) Resp:  [19-22] 19 (05/01 0445) BP: (107-150)/(62-82) 137/63 mmHg (05/01 0445) SpO2:  [92 %-99 %] 94 % (05/01 0445) Weight:  [216 lb 0.8 oz (98 kg)] 216 lb 0.8 oz (98 kg) (05/01 0445) Last BM Date: 09/12/12  Intake/Output from previous day: 04/30 0701 - 05/01 0700 In: 520 [P.O.:480; I.V.:40] Out: 500 [Urine:500] Intake/Output this shift:    Medications Current Facility-Administered Medications  Medication Dose Route Frequency Provider Last Rate Last Dose  . 0.9 %  sodium chloride infusion  250 mL Intravenous PRN Loreli Slot, MD      . acetaminophen (TYLENOL) tablet 1,000 mg  1,000 mg Oral Q6H Wilmon Pali, PA-C   1,000 mg at 09/15/12 2952   Or  . acetaminophen (TYLENOL) solution 975 mg  975 mg Per Tube Q6H Wilmon Pali, PA-C      . ALPRAZolam Prudy Feeler) tablet 0.25 mg  0.25 mg Oral Q6H PRN Loreli Slot, MD   0.25 mg at 09/14/12 2340  . alum & mag hydroxide-simeth (MAALOX/MYLANTA) 200-200-20 MG/5ML suspension 15-30 mL  15-30 mL Oral Q4H PRN Loreli Slot, MD      . amiodarone (NEXTERONE PREMIX) 360 mg/200 mL dextrose IV infusion  30 mg/hr Intravenous Continuous Alleen Borne, MD 16.7 mL/hr at 09/15/12 0551 30 mg/hr at 09/15/12 0551  . amiodarone (NEXTERONE) IV bolus only 150 mg/100 mL  150 mg Intravenous Once Alleen Borne, MD      . aspirin EC tablet 325 mg  325 mg Oral Daily Wilmon Pali, PA-C   325 mg at 09/14/12 8413   Or  . aspirin chewable tablet 324 mg  324 mg Per Tube Daily Gina L Collins, PA-C      . atorvastatin (LIPITOR) tablet 80 mg  80 mg Oral q1800 Lennette Bihari, MD   80 mg at 09/14/12 1751  . bisacodyl (DULCOLAX) EC tablet 10 mg  10 mg Oral Daily Wilmon Pali,  PA-C   10 mg at 09/13/12 2440   Or  . bisacodyl (DULCOLAX) suppository 10 mg  10 mg Rectal Daily Wilmon Pali, PA-C      . cholestyramine Lanetta Inch) packet 1 packet  1 packet Oral BID WC Robbie Lis, PA-C   1 packet at 09/15/12 0831  . clopidogrel (PLAVIX) tablet 75 mg  75 mg Oral Q breakfast Loreli Slot, MD   75 mg at 09/15/12 0831  . docusate sodium (COLACE) capsule 200 mg  200 mg Oral Daily Wilmon Pali, PA-C   200 mg at 09/14/12 0908  . enoxaparin (LOVENOX) injection 40 mg  40 mg Subcutaneous QHS Loreli Slot, MD   40 mg at 09/14/12 2156  . ezetimibe (ZETIA) tablet 10 mg  10 mg Oral q1800 Brittainy Simmons, PA-C   10 mg at 09/14/12 1751  . furosemide (LASIX) tablet 40 mg  40 mg Oral Daily Loreli Slot, MD   40 mg at 09/14/12 1027  . guaiFENesin (MUCINEX) 12 hr tablet 600 mg  600 mg Oral Q12H PRN Loreli Slot, MD   600 mg at 09/14/12 2340  . insulin aspart (novoLOG) injection 0-15 Units  0-15 Units Subcutaneous TID WC Loreli Slot,  MD   2 Units at 09/14/12 1753  . insulin detemir (LEVEMIR) injection 10 Units  10 Units Subcutaneous Q1200 Loreli Slot, MD   10 Units at 09/14/12 1205  . loratadine (CLARITIN) tablet 10 mg  10 mg Oral Daily Wilburt Finlay, PA-C   10 mg at 09/14/12 0908  . losartan (COZAAR) tablet 50 mg  50 mg Oral Daily Gina L Collins, PA-C      . magnesium hydroxide (MILK OF MAGNESIA) suspension 30 mL  30 mL Oral Daily PRN Loreli Slot, MD      . metoprolol tartrate (LOPRESSOR) tablet 12.5 mg  12.5 mg Oral BID Wilmon Pali, PA-C   12.5 mg at 09/14/12 2156   Or  . metoprolol tartrate (LOPRESSOR) 25 mg/10 mL oral suspension 12.5 mg  12.5 mg Per Tube BID Wilmon Pali, PA-C      . ondansetron (ZOFRAN) injection 4 mg  4 mg Intravenous Q6H PRN Wilmon Pali, PA-C   4 mg at 09/13/12 1302  . oxyCODONE (Oxy IR/ROXICODONE) immediate release tablet 5-10 mg  5-10 mg Oral Q3H PRN Wilmon Pali, PA-C   5 mg at 09/14/12 2317   . pantoprazole (PROTONIX) EC tablet 40 mg  40 mg Oral Daily Wilmon Pali, PA-C   40 mg at 09/14/12 0911  . potassium chloride SA (K-DUR,KLOR-CON) CR tablet 20 mEq  20 mEq Oral Daily Loreli Slot, MD   20 mEq at 09/14/12 4098  . sodium chloride 0.9 % injection 3 mL  3 mL Intravenous Q12H Loreli Slot, MD   3 mL at 09/14/12 2135  . sodium chloride 0.9 % injection 3 mL  3 mL Intravenous PRN Loreli Slot, MD      . zolpidem [facility]) tablet 5 mg  5 mg Oral QHS PRN Loreli Slot, MD        PE: General appearance: alert, cooperative and no distress Lungs: clear to auscultation bilaterally Heart: regular rate and rhythm Extremities: no LEE Pulses: 2+ and symmetric Skin: warm and dry Neurologic: Grossly normal  Lab Results:   Recent Labs  09/13/12 1700 09/13/12 1724 09/14/12 0500 09/15/12 0540  WBC 13.3*  --  13.3* 8.0  HGB 11.4* 11.9* 11.4* 11.3*  HCT 33.3* 35.0* 33.6* 33.2*  PLT 123*  --  125* 122*   BMET  Recent Labs  09/13/12 0405  09/13/12 1724 09/14/12 0500 09/15/12 0540  NA 134*  --  133* 130* 134*  K 5.1  --  4.5 4.7 4.2  CL 102  --  98 96 96  CO2 24  --   --  27 27  GLUCOSE 140*  --  139* 128* 125*  BUN 15  --  15 16 15   CREATININE 1.14  < > 1.00 0.98 0.92  CALCIUM 8.3*  --   --  9.1 9.0  < > = values in this interval not displayed. PT/INR  Recent Labs  09/12/12 1145  LABPROT 13.0  INR 0.99    Assessment/Plan  Principal Problem:   Unstable angina Active Problems:   CAD- prior LAD and RCA stenting ('97 and '98) CFX stent '04   HYPERLIPIDEMIA   TOBACCO ABUSE   BRONCHITIS, CHRONIC- finnished course of ABs and steroids 4/19   Elevated LFTs    EKG abnormality- transient TWI   PVD- Lt SFA stent '04, Rt SFA stent '08   CAD (coronary artery disease), native coronary artery - Severe Ostial & Proximal LAD ~90%; Ostial Circumflex  ostial ~60%; Plan CABG  Plan: Pt went into a-fib last PM with a ventricular rate into the 130s.  He was placed on IV amiodarone and converted back into NSR. Currently in SR with HR in the 90s. Continue on IV amiodarone and PO Lopressor. BP is stable. No chest pain or SOB. He is on a good heart regimen of BB, ARB, ASA + Statin. Will continue to monitor. Will need f/u with Dr. Alanda Amass after discharge.     LOS: 10 days   Brittainy M. Delmer Islam 09/15/2012 9:48 AM  I have seen and evaluated the patient this AM along with Boyce Medici, PA. I agree with her findings, examination as well as impression recommendations.  Briefly went into Afib with rates in 160s -- chemically converted with Amiodarone & currently on IV load.  Feels better in NSR -- was quite SOB with some chest tightness while in Afib.  Can probably convert to PO amiodarone after IV Load complete -- 400mg  bid x 1 week, 200 mg bid x 1-2 weeks, then 200 mg daily (but would anticipate that it would not be necessary long term).  Gentle diuresis per CVTS  Suspect that Afib may be related to low grade Fever o/n -- ? Atelectasis related, counseled to use Incentive Spirometry. -- watch for SSx of infection\  BB & ARB -- SBP range from 110-160 makes titration tricky. On aggressive Lipid control regimen.    Marykay Lex, M.D., M.S. [facility] [address] [place], [place]  [address]  [phone] Pager # [phone] 09/15/2012 10:11 AM

## 2012-09-15 NOTE — Progress Notes (Signed)
Pt still not wanting to ambulate at this time; pt extremely fatigued; wife in room; will attempt ambulation at a later time; will cont. To monitor.

## 2012-09-15 NOTE — Progress Notes (Signed)
PA on unit at this time made aware of pt's current HR and rhythm; PA to see pt; will cont. To monitor.

## 2012-09-16 LAB — GLUCOSE, CAPILLARY
Glucose-Capillary: 118 mg/dL — ABNORMAL HIGH (ref 70–99)
Glucose-Capillary: 139 mg/dL — ABNORMAL HIGH (ref 70–99)
Glucose-Capillary: 96 mg/dL (ref 70–99)
Glucose-Capillary: 148 mg/dL — ABNORMAL HIGH (ref 70–99)

## 2012-09-16 MED ORDER — AMIODARONE HCL IN DEXTROSE 360-4.14 MG/200ML-% IV SOLN
30.0000 mg/h | INTRAVENOUS | Status: DC
  Administered 2012-09-16: 30 mg/h via INTRAVENOUS
  Filled 2012-09-16 (×6): qty 200

## 2012-09-16 NOTE — Progress Notes (Signed)
[facility]  Subjective: Currently w/o symptoms. He reports that he was very SOB last PM, when he went into a-fib. Ambulating with cardiac rehab w/o difficulty.  Objective: Vital signs in last 24 hours: Temp:  [98.6 F (37 C)-98.9 F (37.2 C)] 98.6 F (37 C) (05/02 0439) Pulse Rate:  [62-130] 83 (05/02 0439) Resp:  [21] 21 (05/01 1304) BP: (104-122)/(51-79) 109/59 mmHg (05/02 0439) SpO2:  [96 %-100 %] 100 % (05/02 0900) Weight:  [200 lb 13.4 oz (91.1 kg)] 200 lb 13.4 oz (91.1 kg) (05/02 0439) Last BM Date: 09/12/12  Intake/Output from previous day: 05/01 0701 - 05/02 0700 In: 2605 [P.O.:480; I.V.:1991] Out: 1350 [Urine:1350] Intake/Output this shift: Total I/O In: 240 [P.O.:240] Out: -   Medications Current Facility-Administered Medications  Medication Dose Route Frequency Provider Last Rate Last Dose  . 0.9 %  sodium chloride infusion  250 mL Intravenous PRN Loreli Slot, MD 10 mL/hr at 09/15/12 1333 250 mL at 09/15/12 1333  . acetaminophen (TYLENOL) tablet 1,000 mg  1,000 mg Oral Q6H Wilmon Pali, PA-C   1,000 mg at 09/16/12 0536   Or  . acetaminophen (TYLENOL) solution 975 mg  975 mg Per Tube Q6H Wilmon Pali, PA-C      . ALPRAZolam Prudy Feeler) tablet 0.25 mg  0.25 mg Oral Q6H PRN Loreli Slot, MD   0.25 mg at 09/14/12 2340  . alum & mag hydroxide-simeth (MAALOX/MYLANTA) 200-200-20 MG/5ML suspension 15-30 mL  15-30 mL Oral Q4H PRN Loreli Slot, MD      . amiodarone (NEXTERONE PREMIX) 360 mg/200 mL dextrose IV infusion  30 mg/hr Intravenous Continuous Purcell Nails, MD 16.7 mL/hr at 09/16/12 0249 30 mg/hr at 09/16/12 0249  . amiodarone (PACERONE) tablet 400 mg  400 mg Oral BID Loreli Slot, MD   400 mg at 09/16/12 1001  . aspirin EC tablet 325 mg  325 mg Oral Daily Wilmon Pali, PA-C   325 mg at 09/16/12 1002   Or  . aspirin chewable tablet 324 mg  324 mg Per Tube Daily Gina L Collins, PA-C      .  atorvastatin (LIPITOR) tablet 80 mg  80 mg Oral q1800 Lennette Bihari, MD   80 mg at 09/15/12 1648  . bisacodyl (DULCOLAX) EC tablet 10 mg  10 mg Oral Daily Wilmon Pali, PA-C   10 mg at 09/16/12 1002   Or  . bisacodyl (DULCOLAX) suppository 10 mg  10 mg Rectal Daily Wilmon Pali, PA-C      . cholestyramine Lanetta Inch) packet 1 packet  1 packet Oral BID WC Brittainy Simmons, PA-C   1 packet at 09/16/12 0801  . clopidogrel (PLAVIX) tablet 75 mg  75 mg Oral Q breakfast Loreli Slot, MD   75 mg at 09/16/12 0801  . docusate sodium (COLACE) capsule 200 mg  200 mg Oral Daily Wilmon Pali, PA-C   200 mg at 09/16/12 1002  . enoxaparin (LOVENOX) injection 40 mg  40 mg Subcutaneous QHS Loreli Slot, MD   40 mg at 09/15/12 2239  . ezetimibe (ZETIA) tablet 10 mg  10 mg Oral q1800 Brittainy Simmons, PA-C   10 mg at 09/15/12 1648  . furosemide (LASIX) tablet 40 mg  40 mg Oral Daily Loreli Slot, MD   40 mg at 09/16/12 1002  . guaiFENesin (MUCINEX) 12 hr tablet 600 mg  600 mg Oral Q12H PRN Loreli Slot, MD  600 mg at 09/14/12 2340  . insulin aspart (novoLOG) injection 0-15 Units  0-15 Units Subcutaneous TID WC Loreli Slot, MD   2 Units at 09/15/12 1157  . insulin detemir (LEVEMIR) injection 10 Units  10 Units Subcutaneous Q1200 Loreli Slot, MD   10 Units at 09/14/12 1205  . loratadine (CLARITIN) tablet 10 mg  10 mg Oral Daily Wilburt Finlay, PA-C   10 mg at 09/16/12 1001  . losartan (COZAAR) tablet 50 mg  50 mg Oral Daily Wilmon Pali, PA-C   50 mg at 09/16/12 1001  . magnesium hydroxide (MILK OF MAGNESIA) suspension 30 mL  30 mL Oral Daily PRN Loreli Slot, MD      . metoprolol tartrate (LOPRESSOR) tablet 25 mg  25 mg Oral BID Ardelle Balls, PA-C   25 mg at 09/16/12 1001  . ondansetron (ZOFRAN) injection 4 mg  4 mg Intravenous Q6H PRN Wilmon Pali, PA-C   4 mg at 09/13/12 1302  . oxyCODONE (Oxy IR/ROXICODONE) immediate release tablet 5-10 mg   5-10 mg Oral Q3H PRN Wilmon Pali, PA-C   5 mg at 09/16/12 0811  . pantoprazole (PROTONIX) EC tablet 40 mg  40 mg Oral Daily Wilmon Pali, PA-C   40 mg at 09/16/12 1002  . potassium chloride SA (K-DUR,KLOR-CON) CR tablet 20 mEq  20 mEq Oral Daily Loreli Slot, MD   20 mEq at 09/16/12 1001  . sodium chloride 0.9 % injection 3 mL  3 mL Intravenous Q12H Loreli Slot, MD   3 mL at 09/16/12 1003  . sodium chloride 0.9 % injection 3 mL  3 mL Intravenous PRN Loreli Slot, MD      . zolpidem [facility]) tablet 5 mg  5 mg Oral QHS PRN Loreli Slot, MD        PE: General appearance: alert, cooperative and no distress Lungs: decreased breath sounds over bilateral lung bases Heart: regular rate and rhythm Extremities: no LEE Pulses: 2+ and symmetric Skin: warm and dry Neurologic: Grossly normal  Lab Results:   Recent Labs  09/13/12 1700 09/13/12 1724 09/14/12 0500 09/15/12 0540  WBC 13.3*  --  13.3* 8.0  HGB 11.4* 11.9* 11.4* 11.3*  HCT 33.3* 35.0* 33.6* 33.2*  PLT 123*  --  125* 122*   BMET  Recent Labs  09/13/12 1724 09/14/12 0500 09/15/12 0540  NA 133* 130* 134*  K 4.5 4.7 4.2  CL 98 96 96  CO2  --  27 27  GLUCOSE 139* 128* 125*  BUN 15 16 15   CREATININE 1.00 0.98 0.92  CALCIUM  --  9.1 9.0    Assessment/Plan  Principal Problem:   Unstable angina Active Problems:   CAD- prior LAD and RCA stenting ('97 and '98) CFX stent '04   HYPERLIPIDEMIA   TOBACCO ABUSE   BRONCHITIS, CHRONIC- finnished course of ABs and steroids 4/19   Elevated LFTs    EKG abnormality- transient TWI   PVD- Lt SFA stent '04, Rt SFA stent '08   CAD (coronary artery disease), native coronary artery - Severe Ostial & Proximal LAD ~90%; Ostial Circumflex ostial ~60%; Plan CABG  Plan: S/P CABG. POD #4. Pt went into A-fib again last night and was placed back on IV Amiodarone. He converted back to NSR. He continues in NS with HR in the 80s. He is currently off of IV Amio  and back on PO, 400 mg BID. No increase in BB due to  BP. Per [facility]TT[facility], they will consider anticoagulation if AF persist. For now, continue with ASA, BB, ARB and statin. Will continue to follow.     LOS: 11 days    Brittainy M. Delmer Islam 09/16/2012 11:33 AM   Patient seen and examined. Agree with assessment and plan. Currently in sinus rhythm with PAC's. Tolerating amiodarone. Cardiac rehab post dc.   Lennette Bihari, MD, [facility] 09/16/2012 2:12 PM

## 2012-09-16 NOTE — Progress Notes (Signed)
09/16/2012 0110 Pt back in NSR HR 77. Will continue to monitor. Leelan Rajewski, Avie Echevaria , RN

## 2012-09-16 NOTE — Progress Notes (Signed)
CARDIAC REHAB PHASE I   PRE:  Rate/Rhythm: 85 SR    BP: sitting 128/62    SaO2: 90-91 RA  MODE:  Ambulation: 510 ft   POST:  Rate/Rhythm: 103 ST    BP: sitting 120/70     SaO2: 96 2L  Tolerated well. Much improved today from yesterday. Walked on O2 as SaO2 was borderline before walk. Used RW and 2L. Return to recliner. Left on O2 at pts request.  [id number]   Elissa Lovett Britton CES, ACSM 09/16/2012 12:44 PM

## 2012-09-16 NOTE — Progress Notes (Signed)
09/15/2012 2200  MD notified per PA request to clarify the d/c of IV amiodarone and the initiation of PO amiodarone tonight after a couple of episodes of Afib today.  New order received to continue IV amiodarone until the AM and d/c the IV amiodarone after first PO dose tomorrow 09/16/12. Will continue to monitor. Kaelan Amble, Avie Echevaria , RN

## 2012-09-16 NOTE — Progress Notes (Signed)
09/15/2012 2340 Pt back in Afib on the monitor HR 110's.  Pt states he can feel his heart "racing".  Pt remains on IV amiodarone.  No other complaints at this time. Will continue to monitor. Ediberto Sens, Avie Echevaria , RN

## 2012-09-16 NOTE — Progress Notes (Signed)
                    [address]            [place] [address]          [phone]     4 Days Post-Op Procedure(s) (LRB): OFF PUMP CORONARY ARTERY BYPASS GRAFTING (CABG) (N/A)  Subjective: Had recurrent AF overnight requiring restart of IV Amio.  Feels lousy this am.  Rested poorly, breathing short with exertion.   Objective: Vital signs in last 24 hours: Patient Vitals for the past 24 hrs:  BP Temp Temp src Pulse Resp SpO2 Weight  09/16/12 0439 109/59 mmHg 98.6 F (37 C) Oral 83 - 100 % 200 lb 13.4 oz (91.1 kg)  09/15/12 1953 104/51 mmHg 98.7 F (37.1 C) Oral 62 - 96 % -  09/15/12 1341 115/69 mmHg - - 114 - - -  09/15/12 1334 121/63 mmHg - - 128 - - -  09/15/12 1304 122/79 mmHg 98.9 F (37.2 C) Oral 130 21 97 % -  09/15/12 1110 178/75 mmHg - - 90 - 95 % -   Current Weight  09/16/12 200 lb 13.4 oz (91.1 kg)  PRE-OPERATIVE WEIGHT: 87.5 kg    Intake/Output from previous day: 05/01 0701 - 05/02 0700 In: 2605 [P.O.:480; I.V.:1991] Out: 1350 [Urine:1350]    PHYSICAL EXAM:  Heart: RRR Lungs: Decreased BS in bases Wound: Clean and dry Extremities: +LE edema    Lab Results: CBC: Recent Labs  09/14/12 0500 09/15/12 0540  WBC 13.3* 8.0  HGB 11.4* 11.3*  HCT 33.6* 33.2*  PLT 125* 122*   BMET:  Recent Labs  09/14/12 0500 09/15/12 0540  NA 130* 134*  K 4.7 4.2  CL 96 96  CO2 27 27  GLUCOSE 128* 125*  BUN 16 15  CREATININE 0.98 0.92  CALCIUM 9.1 9.0    PT/INR: No results found for this basename: LABPROT, INR,  in the last 72 hours    Assessment/Plan: S/P Procedure(s) (LRB): OFF PUMP CORONARY ARTERY BYPASS GRAFTING (CABG) (N/A) CV- AF, back in SR. Will try again toswitch to po Amio this am and stop IV. BPs low normal, so will not push beta blocker yet.  Consider anticoagulation if AF persists. Vol overload- diurese.  Hyperglycemia- wean Levemir, continue SSI. A1C= 6.5.   CRPI.    LOS: 11 days    Caroleena Paolini  H 09/16/2012

## 2012-09-16 NOTE — Progress Notes (Signed)
Pt ambulated 800 ft in hallway with RN using front wheel walker on 2L 02, pt tolerated well. Resting 02 level: 94%, after ambulation back in chair resting, 02 95%. Will continue to monitor.

## 2012-09-17 LAB — CBC
HCT: 29.6 % — ABNORMAL LOW (ref 39.0–52.0)
Hemoglobin: 10.2 g/dL — ABNORMAL LOW (ref 13.0–17.0)
MCH: 28.8 pg (ref 26.0–34.0)
MCHC: 34.5 g/dL (ref 30.0–36.0)
MCV: 83.6 fL (ref 78.0–100.0)
Platelets: 197 10*3/uL (ref 150–400)
RBC: 3.54 MIL/uL — ABNORMAL LOW (ref 4.22–5.81)
RDW: 14.1 % (ref 11.5–15.5)
WBC: 6.6 10*3/uL (ref 4.0–10.5)

## 2012-09-17 LAB — BASIC METABOLIC PANEL
BUN: 15 mg/dL (ref 6–23)
CO2: 28 mEq/L (ref 19–32)
Calcium: 8.8 mg/dL (ref 8.4–10.5)
Chloride: 100 mEq/L (ref 96–112)
Creatinine, Ser: 1.07 mg/dL (ref 0.50–1.35)
GFR calc Af Amer: 82 mL/min — ABNORMAL LOW (ref >90)
GFR calc non Af Amer: 70 mL/min — ABNORMAL LOW (ref >90)
Glucose, Bld: 91 mg/dL (ref 70–99)
Potassium: 3.8 mEq/L (ref 3.5–5.1)
Sodium: 138 mEq/L (ref 135–145)

## 2012-09-17 LAB — GLUCOSE, CAPILLARY
Glucose-Capillary: 101 mg/dL — ABNORMAL HIGH (ref 70–99)
Glucose-Capillary: 123 mg/dL — ABNORMAL HIGH (ref 70–99)
Glucose-Capillary: 127 mg/dL — ABNORMAL HIGH (ref 70–99)
Glucose-Capillary: 133 mg/dL — ABNORMAL HIGH (ref 70–99)

## 2012-09-17 NOTE — Progress Notes (Signed)
Pt ambulated just prior to my arrival for 329ft. Was not using O2, felt fine, no SOB at rest. Education completed with pt and spouse. Pt interested in CRPII, will refer to [place].   [phone] Blima Dessert, MS, ACSM-CES

## 2012-09-17 NOTE — Progress Notes (Signed)
Patient walked independently with wife in hallway w/out O2 or walker. No distress noted. Returned to room w/out incidence. Call bell and family near. Brian Brock

## 2012-09-17 NOTE — Progress Notes (Signed)
Chest tube sutures removed per protocol/order with steri-strips applied. Atrial EPW removed w/out difficulty; no bleeding or ectopy noted. LV EPW difficult to remove. Spoke with Coral Ceo; if Dr. Tyrone Sage unable to remove this evening will remove in am. Dr. Tyrone Sage aware of situation. LV EPW re-taped for future removal. Patient instructed to remain in bed x1 hr post removal; instructed to inform RN of any difficulties. VSS. Call bell and family near. Mamie Levers

## 2012-09-17 NOTE — Progress Notes (Addendum)
                    [address]            [place] [address]          [phone]     5 Days Post-Op Procedure(s) (LRB): OFF PUMP CORONARY ARTERY BYPASS GRAFTING (CABG) (N/A)  Subjective: Feels well, walking in halls with wife this am. No further AF since yesterday.   Objective: Vital signs in last 24 hours: Patient Vitals for the past 24 hrs:  BP Temp Temp src Pulse Resp SpO2 Weight  09/17/12 0559 102/62 mmHg 97.8 F (36.6 C) Oral 78 18 96 % 199 lb 8.3 oz (90.5 kg)  09/16/12 2119 - 98.6 F (37 C) Oral 89 18 100 % -  09/16/12 2116 109/65 mmHg - - 89 - - -  09/16/12 1411 103/51 mmHg 98.4 F (36.9 C) Oral 80 18 100 % -   Current Weight  09/17/12 199 lb 8.3 oz (90.5 kg)   PRE-OPERATIVE WEIGHT: 87.5 kg   Intake/Output from previous day: 05/02 0701 - 05/03 0700 In: 723 [P.O.:720; I.V.:3] Out: 400 [Urine:400]  CBGs [phone]    PHYSICAL EXAM:  Heart: RRR Lungs: Clear Wound: Clean and dry Extremities: Trace LE edema    Lab Results: CBC: Recent Labs  09/15/12 0540 09/17/12 0438  WBC 8.0 6.6  HGB 11.3* 10.2*  HCT 33.2* 29.6*  PLT 122* 197   BMET:  Recent Labs  09/15/12 0540 09/17/12 0438  NA 134* 138  K 4.2 3.8  CL 96 100  CO2 27 28  GLUCOSE 125* 91  BUN 15 15  CREATININE 0.92 1.07  CALCIUM 9.0 8.8    PT/INR: No results found for this basename: LABPROT, INR,  in the last 72 hours    Assessment/Plan: S/P Procedure(s) (LRB): OFF PUMP CORONARY ARTERY BYPASS GRAFTING (CABG) (N/A) CV- AF, now maintaining SR. Continue Amio, Lopressor. Consider anticoagulation if AF recurs.  Vol overload- diurese.  CRPI, pulm toilet. Possibly home in am if remains stable and no further arrhythmias.    LOS: 12 days    COLLINS,GINA H 09/17/2012  Home in am if no afib I have seen and examined [name] and agree with the above assessment  and plan.  Delight Ovens MD Beeper [phone] Office [phone] 09/17/2012 11:18  AM

## 2012-09-17 NOTE — Progress Notes (Signed)
[facility] Progress Note  Subjective:  No chest pain.  Objective:   Vital Signs in the last 24 hours: Temp:  [97.8 F (36.6 C)-98.6 F (37 C)] 97.8 F (36.6 C) (05/03 0559) Pulse Rate:  [78-89] 78 (05/03 0559) Resp:  [18] 18 (05/03 0559) BP: (102-109)/(51-65) 102/62 mmHg (05/03 0559) SpO2:  [96 %-100 %] 96 % (05/03 0559) Weight:  [90.5 kg (199 lb 8.3 oz)] 90.5 kg (199 lb 8.3 oz) (05/03 0559)  Intake/Output from previous day: 05/02 0701 - 05/03 0700 In: 723 [P.O.:720; I.V.:3] Out: 400 [Urine:400]  Scheduled: . acetaminophen  1,000 mg Oral Q6H   Or  . acetaminophen (TYLENOL) oral liquid 160 mg/5 mL  975 mg Per Tube Q6H  . amiodarone  400 mg Oral BID  . aspirin EC  325 mg Oral Daily   Or  . aspirin  324 mg Per Tube Daily  . atorvastatin  80 mg Oral q1800  . bisacodyl  10 mg Oral Daily   Or  . bisacodyl  10 mg Rectal Daily  . cholestyramine  1 packet Oral BID WC  . clopidogrel  75 mg Oral Q breakfast  . docusate sodium  200 mg Oral Daily  . enoxaparin (LOVENOX) injection  40 mg Subcutaneous QHS  . ezetimibe  10 mg Oral q1800  . furosemide  40 mg Oral Daily  . insulin aspart  0-15 Units Subcutaneous TID WC  . insulin detemir  10 Units Subcutaneous Q1200  . loratadine  10 mg Oral Daily  . losartan  50 mg Oral Daily  . metoprolol tartrate  25 mg Oral BID  . pantoprazole  40 mg Oral Daily  . potassium chloride  20 mEq Oral Daily  . sodium chloride  3 mL Intravenous Q12H    Physical Exam:   General appearance: alert, cooperative and no distress Neck: no JVD and supple, symmetrical, trachea midline Lungs: clear to auscultation bilaterally Heart: regular rate and rhythm and 1/6 sem Abdomen: soft, non-tender; bowel sounds normal; no masses,  no organomegaly Extremities: trivial edema   Rate: 86  Rhythm: normal sinus rhythm  Lab Results:    Recent Labs  09/15/12 0540 09/17/12 0438  NA 134* 138  K 4.2 3.8  CL 96 100  CO2 27 28   GLUCOSE 125* 91  BUN 15 15  CREATININE 0.92 1.07   No results found for this basename: TROPONINI, CK, MB,  in the last 72 hours Hepatic Function Panel No results found for this basename: PROT, ALBUMIN, AST, ALT, ALKPHOS, BILITOT, BILIDIR, IBILI,  in the last 72 hours No results found for this basename: INR,  in the last 72 hours BNP (last 3 results) No results found for this basename: PROBNP,  in the last 8760 hours  Lipid Panel     Component Value Date/Time   CHOL 155 09/06/2012 0455   TRIG 179* 09/06/2012 0455   HDL 52 09/06/2012 0455   CHOLHDL 3.0 09/06/2012 0455   VLDL 36 09/06/2012 0455   LDLCALC 67 09/06/2012 0455      Imaging:  No results found.    Assessment/Plan:   Principal Problem:   Unstable angina Active Problems:   HYPERLIPIDEMIA   TOBACCO ABUSE   BRONCHITIS, CHRONIC- finnished course of ABs and steroids 4/19   CAD- prior LAD and RCA stenting ('97 and '98) CFX stent '04   Elevated LFTs    EKG abnormality- transient TWI   PVD- Lt SFA stent '04, Rt SFA stent '  08   CAD (coronary artery disease), native coronary artery - Severe Ostial & Proximal LAD ~90%; Ostial Circumflex ostial ~60%; Plan CABG   Feels much better. No recurrent AF. Oxygen desaturation to mid 80'S with walking. Tolerating oral amiodarone.   Lennette Bihari, MD, [facility] 09/17/2012, 8:57 AM

## 2012-09-17 NOTE — Discharge Summary (Signed)
[address]            [place] [address]          [phone]         Discharge Summary  Name: Brian Brock DOB: [birth year] 67 y.o. MRN: [id number]   Admission Date: 09/05/2012 Discharge Date: 09/18/2012    Admitting Diagnosis: Chest pain   Discharge Diagnosis:  Severe 2 vessel coronary artery disease Unstable angina Postoperative atrial fibrillation Expected postoperative blood loss anemia History of tobacco abuse Right forearm thrombophlebitis  Past Medical History  Diagnosis Date  . Coronary artery disease     Multiple stents  . Myocardial infarct   . Asthma   . COPD (chronic obstructive pulmonary disease)   . Peripheral vascular disease     prior stenting  . Dyslipidemia      Procedures: OFF PUMP CORONARY ARTERY BYPASS GRAFTING x 2 (Left internal mammary artery to left anterior descending, saphenous vein graft to obtuse marginal)- 09/12/2012    HPI:  The patient is a 67 y.o. male with a known history of coronary artery disease, status post multiple previous stents.  He had been in his usual state of health until the date of this admission, when he developed acute onset substernal chest pain.  He took 2 sublingual nitroglycerin with relief.  He presented to the ER for further evaluation.  He was seen by cardiology, and was noted on EKG to have lateral T wave inversions.  He was subsequently admitted for workup and treatment.    Hospital Course:  The patient was admitted to [facility] on 09/05/2012.  He ruled out for myocardial infarction. A myoview study was performed which showed anterior and apical ischemia.  Cardiac catheterization was performed by Dr. Tresa Endo on 09/07/2012, and he was noted to have severe two vessel disease, which was not felt to be amenable to further percutaneous intervention.  A cardiac surgery consult was requested, and Dr. Dorris Fetch saw the patient. He felt the patient would benefit from CABG at  this time.  All risks, benefits and alternatives of surgery were explained in detail, and the patient agreed to proceed.   The patient was taken to the operating room and underwent the above procedure.    The postoperative course has been notable for atrial fibrillation.  He was initially started on IV Amiodarone, and converted to sinus rhythm. He did have one recurrence of rapid A Fib, but once again converted to sinus.  His rhythm has remained stable, and he has been switched to an oral dose of Amiodarone.  He has been started on a beta blocker, but the dose has not been titrated due to low systolic blood pressures.  Likewise, he has not been started on an ACE-inhibitor.  He was started on aspirin and a statin, and Plavix was resumed for his patent stent to the right coronary.  Overall, he is progressing well.  He was started on Lasix for volume overload and is diuresing well. He is tolerating a regular diet and is ambulating independently in the halls.  His hemoglobin A1C was 6.5 on admission, and he has been treated with sliding scale insulin. This will need further outpatient monitoring by his primary care MD.  He was started on Keflex for a right forearm thrombophlebitis.  He was evaluated on today's date and is ready for discharge home.     Recent  vital signs:  Filed Vitals:   09/17/12 0559  BP: 102/62  Pulse: 78  Temp: 97.8 F (36.6 C)  Resp: 18    Recent laboratory studies:  CBC: Recent Labs  09/15/12 0540 09/17/12 0438  WBC 8.0 6.6  HGB 11.3* 10.2*  HCT 33.2* 29.6*  PLT 122* 197   BMET:  Recent Labs  09/15/12 0540 09/17/12 0438  NA 134* 138  K 4.2 3.8  CL 96 100  CO2 27 28  GLUCOSE 125* 91  BUN 15 15  CREATININE 0.92 1.07  CALCIUM 9.0 8.8    PT/INR: No results found for this basename: LABPROT, INR,  in the last 72 hours   Discharge Medications:     Medication List    STOP taking these medications       HYDROcodone-acetaminophen 5-325 MG per tablet    Commonly known as:  NORCO/VICODIN     nebivolol 2.5 MG tablet  Commonly known as:  BYSTOLIC     simvastatin 80 MG tablet  Commonly known as:  ZOCOR      TAKE these medications       acidophilus Caps  Take 1 capsule by mouth daily.     amiodarone 400 MG tablet  Commonly known as:  PACERONE  Take 1 tablet (400 mg total) by mouth 2 (two) times daily. X 1 week, then decrease to 200 mg po bid     aspirin EC 81 MG tablet  Take 81 mg by mouth daily.     atorvastatin 80 MG tablet  Commonly known as:  LIPITOR  Take 1 tablet (80 mg total) by mouth daily.     cephALEXin 500 MG capsule  Commonly known as:  KEFLEX  Take 1 capsule (500 mg total) by mouth 3 (three) times daily. X 1 week     cholestyramine 4 G packet  Commonly known as:  QUESTRAN  Take 1 packet by mouth 2 (two) times daily with a meal.     clopidogrel 75 MG tablet  Commonly known as:  PLAVIX  Take 75 mg by mouth daily.     ezetimibe 10 MG tablet  Commonly known as:  ZETIA  Take 10 mg by mouth every evening.     furosemide 40 MG tablet  Commonly known as:  LASIX  Take 1 tablet (40 mg total) by mouth daily. X 1 week     losartan 50 MG tablet  Commonly known as:  COZAAR  Take 50 mg by mouth every evening.     metoprolol tartrate 25 MG tablet  Commonly known as:  LOPRESSOR  Take 1.5 tablets (37.5 mg total) by mouth 2 (two) times daily.     oxyCODONE 5 MG immediate release tablet  Commonly known as:  Oxy IR/ROXICODONE  Take 1-2 tablets (5-10 mg total) by mouth every 3 (three) hours as needed for pain.     pantoprazole 40 MG tablet  Commonly known as:  PROTONIX  Take 40 mg by mouth 2 (two) times daily.     potassium chloride SA 20 MEQ tablet  Commonly known as:  K-DUR,KLOR-CON  Take 1 tablet (20 mEq total) by mouth daily. X 1 week        Discharge Instructions:  The patient is to refrain from driving, heavy lifting or strenuous activity.  May shower daily and clean incisions with soap and water.  May  resume regular diet.   Follow Up:        Follow-up Information   Follow up  with Loreli Slot, MD. (Office will call to arrange an appointment)    Contact information:   [address] [place] [address] [phone]       Follow up with Nicki Guadalajara A, MD. Schedule an appointment as soon as possible for a visit in 2 weeks.   Contact information:   [address] [place] [address] [phone]        Adella Hare 09/17/2012, 10:13 AM

## 2012-09-17 NOTE — Progress Notes (Signed)
Ambulated 350 ft in hallway using front wheel walker without difficulty. Ambulated on room air; O2 sat 93%. Returned to room w/out incidence. Call bell and family near. Brian Brock

## 2012-09-17 NOTE — Progress Notes (Signed)
The patient ambulated around the unit one time using a front wheel walker and 3L of O2.  The patient's O2 saturation rate was 95% on 2L while resting before ambulation.  During ambulation, the patient's O2 fell into the mid-80s and was allowed to recover on 3L of O2 by standing in the hall.  The patient ambulated the rest of the way on 3L of O2 and his O2 sat remained around 93% for the rest of the walk.

## 2012-09-18 LAB — GLUCOSE, CAPILLARY: Glucose-Capillary: 108 mg/dL — ABNORMAL HIGH (ref 70–99)

## 2012-09-18 MED ORDER — FUROSEMIDE 40 MG PO TABS: 40.0000 mg | ORAL_TABLET | Freq: Every day | ORAL | Status: DC

## 2012-09-18 MED ORDER — CEPHALEXIN 500 MG PO CAPS
500.0000 mg | ORAL_CAPSULE | Freq: Three times a day (TID) | ORAL | Status: DC
  Administered 2012-09-18: 500 mg via ORAL
  Filled 2012-09-18 (×3): qty 1

## 2012-09-18 MED ORDER — OXYCODONE HCL 5 MG PO TABS: 5.0000 mg | ORAL_TABLET | ORAL | Status: DC | PRN

## 2012-09-18 MED ORDER — POTASSIUM CHLORIDE CRYS ER 20 MEQ PO TBCR: 20.0000 meq | EXTENDED_RELEASE_TABLET | Freq: Every day | ORAL | Status: DC

## 2012-09-18 MED ORDER — AMIODARONE HCL 400 MG PO TABS: 400.0000 mg | ORAL_TABLET | Freq: Two times a day (BID) | ORAL | Status: DC

## 2012-09-18 MED ORDER — METOPROLOL TARTRATE 25 MG PO TABS: 37.5000 mg | ORAL_TABLET | Freq: Two times a day (BID) | ORAL | Status: DC

## 2012-09-18 MED ORDER — ATORVASTATIN CALCIUM 80 MG PO TABS: 80.0000 mg | ORAL_TABLET | Freq: Every day | ORAL | Status: DC

## 2012-09-18 MED ORDER — METOPROLOL TARTRATE 12.5 MG HALF TABLET
37.5000 mg | ORAL_TABLET | Freq: Two times a day (BID) | ORAL | Status: DC
  Administered 2012-09-18: 37.5 mg via ORAL
  Filled 2012-09-18 (×2): qty 1

## 2012-09-18 MED ORDER — CEPHALEXIN 500 MG PO CAPS: 500.0000 mg | ORAL_CAPSULE | Freq: Three times a day (TID) | ORAL | Status: DC

## 2012-09-18 NOTE — Progress Notes (Signed)
[facility] Progress Note  Subjective:  No chest pain  Objective:   Vital Signs in the last 24 hours: Temp:  [98.4 F (36.9 C)-99.5 F (37.5 C)] 99.5 F (37.5 C) (05/04 0410) Pulse Rate:  [66-84] 72 (05/04 0410) Resp:  [16-20] 18 (05/04 0410) BP: (98-114)/(47-68) 106/64 mmHg (05/04 0410) SpO2:  [93 %-98 %] 94 % (05/04 0410) Weight:  [90.855 kg (200 lb 4.8 oz)] 90.855 kg (200 lb 4.8 oz) (05/04 0410)  Intake/Output from previous day: 05/03 0701 - 05/04 0700 In: 240 [P.O.:240] Out: -   Scheduled: . amiodarone  400 mg Oral BID  . aspirin EC  325 mg Oral Daily   Or  . aspirin  324 mg Per Tube Daily  . atorvastatin  80 mg Oral q1800  . bisacodyl  10 mg Oral Daily   Or  . bisacodyl  10 mg Rectal Daily  . cholestyramine  1 packet Oral BID WC  . clopidogrel  75 mg Oral Q breakfast  . docusate sodium  200 mg Oral Daily  . enoxaparin (LOVENOX) injection  40 mg Subcutaneous QHS  . ezetimibe  10 mg Oral q1800  . furosemide  40 mg Oral Daily  . insulin aspart  0-15 Units Subcutaneous TID WC  . loratadine  10 mg Oral Daily  . losartan  50 mg Oral Daily  . metoprolol tartrate  25 mg Oral BID  . pantoprazole  40 mg Oral Daily  . potassium chloride  20 mEq Oral Daily  . sodium chloride  3 mL Intravenous Q12H    Physical Exam:   General appearance: alert, cooperative and no distress Neck: no JVD and supple, symmetrical, trachea midline Lungs: clear to auscultation bilaterally Heart: regular rate and rhythm and 1/6 sem Abdomen: soft, non-tender; bowel sounds normal; no masses,  no organomegaly Extremities: trace edema   Rate: 95  Rhythm: normal sinus rhythm with PAC's.  Lab Results:    Recent Labs  09/17/12 0438  NA 138  K 3.8  CL 100  CO2 28  GLUCOSE 91  BUN 15  CREATININE 1.07   No results found for this basename: TROPONINI, CK, MB,  in the last 72 hours Hepatic Function Panel No results found for this basename: PROT, ALBUMIN,  AST, ALT, ALKPHOS, BILITOT, BILIDIR, IBILI,  in the last 72 hours No results found for this basename: INR,  in the last 72 hours BNP (last 3 results) No results found for this basename: PROBNP,  in the last 8760 hours Lipid Panel  Lipid Panel     Component Value Date/Time   CHOL 155 09/06/2012 0455   TRIG 179* 09/06/2012 0455   HDL 52 09/06/2012 0455   CHOLHDL 3.0 09/06/2012 0455   VLDL 36 09/06/2012 0455   LDLCALC 67 09/06/2012 0455      Imaging:  No results found.    Assessment/Plan:   Principal Problem:   Unstable angina Active Problems:   HYPERLIPIDEMIA   TOBACCO ABUSE   BRONCHITIS, CHRONIC- finnished course of ABs and steroids 4/19   CAD- prior LAD and RCA stenting ('97 and '98) CFX stent '04   Elevated LFTs    EKG abnormality- transient TWI   PVD- Lt SFA stent '04, Rt SFA stent '08   CAD (coronary artery disease), native coronary artery - Severe Ostial & Proximal LAD ~90%; Ostial Circumflex ostial ~60%; Plan CABG   DAy 6 s/p CABG. No recurrent AF. LV pacing wire still in, for removal today. Will  titrate lopressor for improved rate control to 37.5 mg bid. F/U with Dr. Alanda Amass post DC.   Lennette Bihari, MD, [facility] 09/18/2012, 8:31 AM

## 2012-09-18 NOTE — Progress Notes (Signed)
                    [address]            [place] [address]          [phone]     6 Days Post-Op Procedure(s) (LRB): OFF PUMP CORONARY ARTERY BYPASS GRAFTING (CABG) (N/A)  Subjective: Feels well, wants to go home.  Objective: Vital signs in last 24 hours: Patient Vitals for the past 24 hrs:  BP Temp Temp src Pulse Resp SpO2 Weight  09/18/12 0936 129/64 mmHg - - 80 - - -  09/18/12 0410 106/64 mmHg 99.5 F (37.5 C) Oral 72 18 94 % 200 lb 4.8 oz (90.855 kg)  09/17/12 2022 109/52 mmHg 98.4 F (36.9 C) Oral 84 20 98 % -  09/17/12 1356 98/47 mmHg 98.6 F (37 C) Oral 70 16 94 % -  09/17/12 1231 114/60 mmHg - - 66 - 93 % -  09/17/12 1215 103/61 mmHg - - 66 - 97 % -  09/17/12 1200 113/68 mmHg - - 73 - 95 % -  09/17/12 1028 110/64 mmHg - - 74 - - -   Current Weight  09/18/12 200 lb 4.8 oz (90.855 kg)  PRE-OPERATIVE WEIGHT: 87.5 kg    Intake/Output from previous day: 05/03 0701 - 05/04 0700 In: 240 [P.O.:240] Out: -   CBGs 127-133-108   PHYSICAL EXAM:  Heart: RRR Lungs: Clear Wound: Clean and dry Extremities: Trace LE edema.  R wrist with redness, firmness at old IV site.    Lab Results: CBC: Recent Labs  09/17/12 0438  WBC 6.6  HGB 10.2*  HCT 29.6*  PLT 197   BMET:  Recent Labs  09/17/12 0438  NA 138  K 3.8  CL 100  CO2 28  GLUCOSE 91  BUN 15  CREATININE 1.07  CALCIUM 8.8    PT/INR: No results found for this basename: LABPROT, INR,  in the last 72 hours    Assessment/Plan: S/P Procedure(s) (LRB): OFF PUMP CORONARY ARTERY BYPASS GRAFTING (CABG) (N/A) CV- AF, now maintaining SR. Continue Amio, Lopressor.  Vol overload- diurese.  R wrist thrombophlebitis- will start Keflex. D/c home today- instructions reviewed with patient.   LOS: 13 days    Lynde Ludwig H 09/18/2012

## 2012-09-18 NOTE — Progress Notes (Signed)
D/c instructions along with med list and scripts provided. IV d/c'd with catheter intact. Patient transported via wheelchair to lobby for d/c home. Belongings with patient and family. Mamie Levers

## 2012-09-20 NOTE — Addendum Note (Signed)
Addendum created 09/20/12 1224 by Adair Laundry, CRNA   Modules edited: Anesthesia Medication Administration

## 2012-09-30 ENCOUNTER — Telehealth: Payer: Self-pay | Admitting: Cardiovascular Disease

## 2012-09-30 ENCOUNTER — Other Ambulatory Visit: Payer: Self-pay | Admitting: *Deleted

## 2012-09-30 DIAGNOSIS — R609 Edema, unspecified: Secondary | ICD-10-CM

## 2012-09-30 MED ORDER — FUROSEMIDE 40 MG PO TABS: 40.0000 mg | ORAL_TABLET | Freq: Every day | ORAL | Status: DC

## 2012-09-30 MED ORDER — POTASSIUM CHLORIDE CRYS ER 20 MEQ PO TBCR: 20.0000 meq | EXTENDED_RELEASE_TABLET | Freq: Every day | ORAL | Status: DC

## 2012-09-30 NOTE — Telephone Encounter (Signed)
Mr Nephew called complaining of edema of his Rt foot. He just had CABG and did have a venectomy on that leg. He denies any calf pain or tenderness. I reassured him he may have some swelling in that leg and that it did not sound like anything to worry about at this time.  Corine Shelter PA-C 09/30/2012 3:09 PM

## 2012-09-30 NOTE — Telephone Encounter (Signed)
Brian Brock (R) foot is swelling and not sure if it may be a blood clot. Wants to know what he should do .Also he has some pain

## 2012-09-30 NOTE — Telephone Encounter (Signed)
Returned call.  Pt c/o R foot swelling since yesterday.  C/o sore/numbness in R calf only.  Stated his calf feels like the numbness you get when you go to the dentist.  Denied warmness or redness.  Stated both feet look and feel the same in color and temperature.  Denied CP or SOB.  Stated he has been walking a lot  Stated he has had feet elevated all day and they are still the same.  Stated he had open heart surgery on May 5th.

## 2012-10-04 ENCOUNTER — Other Ambulatory Visit: Payer: Self-pay | Admitting: *Deleted

## 2012-10-04 DIAGNOSIS — Z951 Presence of aortocoronary bypass graft: Secondary | ICD-10-CM | POA: Diagnosis not present

## 2012-10-04 DIAGNOSIS — I2510 Atherosclerotic heart disease of native coronary artery without angina pectoris: Secondary | ICD-10-CM | POA: Diagnosis not present

## 2012-10-04 DIAGNOSIS — E782 Mixed hyperlipidemia: Secondary | ICD-10-CM | POA: Diagnosis not present

## 2012-10-04 DIAGNOSIS — I739 Peripheral vascular disease, unspecified: Secondary | ICD-10-CM | POA: Diagnosis not present

## 2012-10-05 DIAGNOSIS — R5381 Other malaise: Secondary | ICD-10-CM | POA: Diagnosis not present

## 2012-10-05 DIAGNOSIS — R5383 Other fatigue: Secondary | ICD-10-CM | POA: Diagnosis not present

## 2012-10-05 DIAGNOSIS — R6889 Other general symptoms and signs: Secondary | ICD-10-CM | POA: Diagnosis not present

## 2012-10-11 ENCOUNTER — Encounter: Payer: Self-pay | Admitting: Thoracic Surgery (Cardiothoracic Vascular Surgery)

## 2012-10-11 ENCOUNTER — Ambulatory Visit
Admission: RE | Admit: 2012-10-11 | Discharge: 2012-10-11 | Disposition: A | Discharge status: Home / Self Care | Payer: Medicare Other | Source: Ambulatory Visit | Attending: Thoracic Surgery (Cardiothoracic Vascular Surgery) | Admitting: Thoracic Surgery (Cardiothoracic Vascular Surgery)

## 2012-10-11 ENCOUNTER — Ambulatory Visit (INDEPENDENT_AMBULATORY_CARE_PROVIDER_SITE_OTHER): Payer: Self-pay | Admitting: Thoracic Surgery (Cardiothoracic Vascular Surgery)

## 2012-10-11 ENCOUNTER — Other Ambulatory Visit: Payer: Self-pay | Admitting: *Deleted

## 2012-10-11 VITALS — Ht 69.0 in | Wt 200.0 lb

## 2012-10-11 DIAGNOSIS — I2510 Atherosclerotic heart disease of native coronary artery without angina pectoris: Secondary | ICD-10-CM

## 2012-10-11 DIAGNOSIS — Z951 Presence of aortocoronary bypass graft: Secondary | ICD-10-CM

## 2012-10-11 DIAGNOSIS — J90 Pleural effusion, not elsewhere classified: Secondary | ICD-10-CM | POA: Diagnosis not present

## 2012-10-11 MED ORDER — SIMVASTATIN 80 MG PO TABS: 80.0000 mg | ORAL_TABLET | Freq: Every day | ORAL | Status: DC

## 2012-10-11 MED ORDER — NEBIVOLOL HCL 2.5 MG PO TABS: 2.5000 mg | ORAL_TABLET | Freq: Every day | ORAL | Status: DC

## 2012-10-11 NOTE — Progress Notes (Signed)
HPI:  [name] is a 67 year old gentleman who had off-pump coronary bypass grafting x2 on April 28. Postoperatively he had some atrial fibrillation, that resolved with amiodarone and Lopressor. He did not require anticoagulation. He was discharged home on postoperative day #6.  He has multiple complaints. He says that he is tired, fatigues easily, is not sleeping well, and has a poor appetite. He has some discomfort, but rarely is taking any narcotics. He says that he felt this way in the past when he was treated with metoprolol. He says at that time he felt so poorly that he lost is will to live until the metoprolol was discontinued. He had been on Bystolic prior to surgery and did not have any of those affects from that medication.  Past Medical History  Diagnosis Date  . Coronary artery disease     Multiple stents  . Myocardial infarct   . Asthma   . COPD (chronic obstructive pulmonary disease)   . Peripheral vascular disease     prior stenting  . Dyslipidemia      Current Outpatient Prescriptions  Medication Sig Dispense Refill  . acidophilus (RISAQUAD) CAPS Take 1 capsule by mouth daily.      [name] aspirin EC 81 MG tablet Take 81 mg by mouth daily.      . cholestyramine (QUESTRAN) 4 G packet Take 1 packet by mouth 2 (two) times daily with a meal.      . clopidogrel (PLAVIX) 75 MG tablet Take 75 mg by mouth daily.      [name] ezetimibe (ZETIA) 10 MG tablet Take 10 mg by mouth every evening.      [name] losartan (COZAAR) 50 MG tablet Take 50 mg by mouth every evening.      [name] oxyCODONE (OXY IR/ROXICODONE) 5 MG immediate release tablet Take 1-2 tablets (5-10 mg total) by mouth every 3 (three) hours as needed for pain.  30 tablet  0  . pantoprazole (PROTONIX) 40 MG tablet Take 40 mg by mouth 2 (two) times daily.      [name] zolpidem (AMBIEN) 10 MG tablet Take 10 mg by mouth at bedtime as needed for sleep.      [name] HYDROcodone-acetaminophen (NORCO) 7.5-325 MG per tablet       . nebivolol (BYSTOLIC)  2.5 MG tablet Take 1 tablet (2.5 mg total) by mouth daily.  30 tablet  6  . simvastatin (ZOCOR) 80 MG tablet Take 1 tablet (80 mg total) by mouth at bedtime.  30 tablet  6  . triamcinolone cream (KENALOG) 0.5 %        No current facility-administered medications for this visit.    Physical Exam Ht 5\' 9"  (1.753 m)  Wt 200 lb (90.719 kg)  BMI 29.52 kg/m2  SpO25 33% 67 year old male in no acute distress Neurologic alert and oriented x3 with no focal deficits Lungs clear with equal breath sounds bilaterally Cardiac bradycardic and regular, normal S1 and S2 Sternum stable, incision clean dry and intact The leg incision well-healed no edema  Diagnostic Tests: Chest x-ray 10/11/2012 shows near complete resolution of his left pleural effusion  Impression: 67 year old male status post off-pump coronary bypass grafting x2 about a month ago. He really feels poorly currently. He is having a lot of trouble with nausea, abnormal taste sensations, and difficulty sleeping. He says he feels very tired and rundown. He has had all these issues in the past when treated with metoprolol. We will stop his metoprolol and start him on Bystolic 2.5  mg daily. I also think amiodarone may be playing a role, so we will stop that as he is now 4 weeks out and has had no evidence of recurrent atrial fibrillation. He also was switched from simvastatin to atorvastatin while in the hospital. We will put him back on simvastatin 80 mg daily and DC the atorvastatin. Hopefully with these medication changes he will be able to see some improvement in his symptomatology.  He drove to the office today without difficulty. I recommended that he only dry when he feels well. He should avoid high speeds and heavy traffic for the next several weeks. He should not drive if he feels poorly.  He is not to lift anything over 10 pounds for another 2 weeks.    Plan: Discontinue amiodarone  Discontinue metoprolol and start Bystolic 2.5 mg  by mouth daily  Discontinue atorvastatin and start simvastatin 80 mg by mouth daily  Return in 3 weeks to check on his progress.

## 2012-10-12 ENCOUNTER — Encounter: Payer: Self-pay | Admitting: Cardiovascular Disease

## 2012-10-13 DIAGNOSIS — Z79899 Other long term (current) drug therapy: Secondary | ICD-10-CM | POA: Diagnosis not present

## 2012-10-13 DIAGNOSIS — R7301 Impaired fasting glucose: Secondary | ICD-10-CM | POA: Diagnosis not present

## 2012-10-20 ENCOUNTER — Telehealth: Payer: Self-pay | Admitting: Cardiovascular Disease

## 2012-10-20 MED ORDER — CHOLESTYRAMINE 4 G PO PACK: 1.0000 | PACK | Freq: Two times a day (BID) | ORAL | Status: DC

## 2012-10-20 NOTE — Telephone Encounter (Signed)
Been 2 days waiting for his refill on Cholestyramine_please call today to [facility]-[phone]!

## 2012-10-20 NOTE — Telephone Encounter (Signed)
Called pharmacy refill [employer] [facility]

## 2012-10-25 ENCOUNTER — Telehealth: Payer: Self-pay | Admitting: Cardiovascular Disease

## 2012-10-25 NOTE — Telephone Encounter (Signed)
Brian Brock is needing a letter faxed over to his Therapist saying that he can have cardiac rehab.. Had open heart surgery about a month ago.[name] Please fax over to [facility].. Fax number [phone].[name] Attention Aniceto Boss   Thanks

## 2012-10-27 ENCOUNTER — Other Ambulatory Visit: Payer: Self-pay | Admitting: *Deleted

## 2012-10-27 DIAGNOSIS — I2510 Atherosclerotic heart disease of native coronary artery without angina pectoris: Secondary | ICD-10-CM

## 2012-10-27 NOTE — Telephone Encounter (Signed)
Talked to [facility] and informed him that it was faxed over . Pt. Stated understanding of information.

## 2012-10-31 DIAGNOSIS — M6281 Muscle weakness (generalized): Secondary | ICD-10-CM | POA: Diagnosis not present

## 2012-10-31 DIAGNOSIS — I2510 Atherosclerotic heart disease of native coronary artery without angina pectoris: Secondary | ICD-10-CM | POA: Diagnosis not present

## 2012-10-31 DIAGNOSIS — I4891 Unspecified atrial fibrillation: Secondary | ICD-10-CM | POA: Diagnosis not present

## 2012-11-01 ENCOUNTER — Ambulatory Visit (INDEPENDENT_AMBULATORY_CARE_PROVIDER_SITE_OTHER): Payer: Self-pay | Admitting: Thoracic Surgery (Cardiothoracic Vascular Surgery)

## 2012-11-01 ENCOUNTER — Encounter: Payer: Self-pay | Admitting: Thoracic Surgery (Cardiothoracic Vascular Surgery)

## 2012-11-01 VITALS — BP 124/73 | HR 52 | Resp 16 | Ht 69.0 in | Wt 191.0 lb

## 2012-11-01 DIAGNOSIS — I2510 Atherosclerotic heart disease of native coronary artery without angina pectoris: Secondary | ICD-10-CM

## 2012-11-01 DIAGNOSIS — Z951 Presence of aortocoronary bypass graft: Secondary | ICD-10-CM

## 2012-11-01 NOTE — Progress Notes (Signed)
HPI:  [name] returns today for a second postoperative visit. He had off-pump coronary bypass grafting x2 on April 28. His postoperative course was uncomplicated.  When he came or his first postoperative visit he complained of feeling tired and rundown. He was having a lot of problems with nausea. He is having difficulty sleeping. Food tasted funny. He felt that a lot of this was due to metoprolol which it had problems with before. He had taken nebivolol without any adverse effects previously. We stopped his amiodarone, changed his metoprolol to nebivolol, and changed his atorvastatin to simvastatin.  He says that he began feeling better almost immediately after the medication changes. His appetite is improved. He is starting sleep a little better. His exercise tolerance is improved. He is anxious to increase his physical activities. He specifically has questions about doing yard work.  Past Medical History  Diagnosis Date  . Coronary artery disease     Multiple stents  . Myocardial infarct   . Asthma   . COPD (chronic obstructive pulmonary disease)   . Peripheral vascular disease     prior stenting  . Dyslipidemia       Current Outpatient Prescriptions  Medication Sig Dispense Refill  . acidophilus (RISAQUAD) CAPS Take 1 capsule by mouth daily.      [name] aspirin EC 81 MG tablet Take 81 mg by mouth daily.      . cholestyramine (QUESTRAN) 4 G packet Take 1 packet by mouth 2 (two) times daily with a meal.  60 each  11  . clopidogrel (PLAVIX) 75 MG tablet Take 75 mg by mouth daily.      [name] ezetimibe (ZETIA) 10 MG tablet Take 10 mg by mouth every evening.      [name] HYDROcodone-acetaminophen (NORCO) 7.5-325 MG per tablet       . losartan (COZAAR) 50 MG tablet Take 50 mg by mouth every evening.      . nebivolol (BYSTOLIC) 2.5 MG tablet Take 1 tablet (2.5 mg total) by mouth daily.  30 tablet  6  . pantoprazole (PROTONIX) 40 MG tablet Take 40 mg by mouth 2 (two) times daily.      .  simvastatin (ZOCOR) 80 MG tablet Take 1 tablet (80 mg total) by mouth at bedtime.  30 tablet  6  . triamcinolone cream (KENALOG) 0.5 %       . zolpidem (AMBIEN) 10 MG tablet Take 10 mg by mouth at bedtime as needed for sleep.       No current facility-administered medications for this visit.    Physical Exam BP 124/73  Pulse 52  Resp 16  Ht 5\' 9"  (1.753 m)  Wt 191 lb (86.637 kg)  BMI 28.19 kg/m2  SpO4 31% 67 year old male in no acute distress Neurologic alert and oriented x3 with no focal deficits Cardiac regular rate and rhythm normal S1-S2 Sternum stable, incision clean dry and intact Lungs good breath sounds bilaterally Leg incision healing well, no peripheral edema  Diagnostic Tests: none  Impression: 67 year old gentleman who is now 6 weeks out from off-pump 40 bypass grafting x2. He looks great at this time. His exercise tolerance is good and continues to improve. He is still having some discomfort but has not taken any narcotics since the first week he was home. He will occasionally use ibuprofen.  At this point his activities are unrestricted. His sternum appears to be well-healed. I did advise him to build into his new activities gradually.  Plan: He will followup  with Dr. Susa Brock.  I will be happy to see him back any time if I can be of any further assistance with his care

## 2012-11-02 DIAGNOSIS — M6281 Muscle weakness (generalized): Secondary | ICD-10-CM | POA: Diagnosis not present

## 2012-11-02 DIAGNOSIS — I2510 Atherosclerotic heart disease of native coronary artery without angina pectoris: Secondary | ICD-10-CM | POA: Diagnosis not present

## 2012-11-02 DIAGNOSIS — I4891 Unspecified atrial fibrillation: Secondary | ICD-10-CM | POA: Diagnosis not present

## 2012-11-04 ENCOUNTER — Ambulatory Visit (INDEPENDENT_AMBULATORY_CARE_PROVIDER_SITE_OTHER): Payer: Medicare Other | Admitting: Cardiology

## 2012-11-04 ENCOUNTER — Encounter: Payer: Self-pay | Admitting: Cardiology

## 2012-11-04 ENCOUNTER — Telehealth: Payer: Self-pay | Admitting: Cardiovascular Disease

## 2012-11-04 VITALS — BP 116/76 | HR 43 | Ht 69.5 in | Wt 189.4 lb

## 2012-11-04 DIAGNOSIS — I959 Hypotension, unspecified: Secondary | ICD-10-CM

## 2012-11-04 DIAGNOSIS — R42 Dizziness and giddiness: Secondary | ICD-10-CM | POA: Diagnosis not present

## 2012-11-04 DIAGNOSIS — I480 Paroxysmal atrial fibrillation: Secondary | ICD-10-CM | POA: Insufficient documentation

## 2012-11-04 DIAGNOSIS — I2510 Atherosclerotic heart disease of native coronary artery without angina pectoris: Secondary | ICD-10-CM | POA: Diagnosis not present

## 2012-11-04 DIAGNOSIS — I498 Other specified cardiac arrhythmias: Secondary | ICD-10-CM

## 2012-11-04 DIAGNOSIS — I4891 Unspecified atrial fibrillation: Secondary | ICD-10-CM

## 2012-11-04 DIAGNOSIS — R001 Bradycardia, unspecified: Secondary | ICD-10-CM | POA: Insufficient documentation

## 2012-11-04 NOTE — Assessment & Plan Note (Signed)
No syncope or pre syncope 

## 2012-11-04 NOTE — Assessment & Plan Note (Signed)
Post CABG. Amiodarone stopped 2 weeks ago

## 2012-11-04 NOTE — Assessment & Plan Note (Signed)
HR 43 in the office today

## 2012-11-04 NOTE — Telephone Encounter (Signed)
Returned call.  Pt stated his BP has been dropping over the last 4 days.  Pt c/o dizziness and intermittent light HA w/ low BP.  Denied CP, SOB or vision changes.  BP: 89/58, 94/60, 98/59.  Today, BP 126/72 and 108/60s.  Pt had appt next Thursday w/ Dr. Alanda Amass and informed Dr. Alanda Amass is out of the office next week  Appt scheduled for today at 4:20pm w/ L. Diona Fanti, PA-C for evaluation and then f/u w/ Dr. Alanda Amass.  Pt verbalized understanding and agreed w/ plan.

## 2012-11-04 NOTE — Assessment & Plan Note (Signed)
Stable

## 2012-11-04 NOTE — Progress Notes (Signed)
14-day cardionet monitor placed. Patient given instructions and voiced understanding.

## 2012-11-04 NOTE — Telephone Encounter (Signed)
PLEASE CALL BLOOD PRESSURE GOING UP AND DOWN-SOME HEART FLUTERING!

## 2012-11-04 NOTE — Progress Notes (Signed)
11/04/2012 AMADI FRADY   12-Feb-1946  [id number]  Primary Physicia Governor Rooks, MD Primary Cardiologist: Dr Alanda Amass  HPI:  67 y/o with a history of CAD s/p multiple PCI in the past. He ultimately had CABG X 17 August 2012. Post op he had PAF. He was on Amiodarone and Metoprolol but felt poorly on this and his Metoprolol was changed to Bystolic 2.5mg  and his Amiodarone was stopped.  He has been chronically bradycardic but asymptomatic, even before his CABG. He was worried his B/P was too low and is seen now as an add on. He denies syncope. He says at times he gets "dizzy". He denies any palpitations.     Current Outpatient Prescriptions  Medication Sig Dispense Refill  . acidophilus (RISAQUAD) CAPS Take 1 capsule by mouth daily.      [name] aspirin EC 81 MG tablet Take 81 mg by mouth daily.      . cholestyramine (QUESTRAN) 4 G packet Take 1 packet by mouth 2 (two) times daily with a meal.  60 each  11  . clopidogrel (PLAVIX) 75 MG tablet Take 75 mg by mouth daily.      [name] ezetimibe (ZETIA) 10 MG tablet Take 10 mg by mouth every evening.      [name] losartan (COZAAR) 50 MG tablet Take 50 mg by mouth every evening.      . nebivolol (BYSTOLIC) 2.5 MG tablet Take 1 tablet (2.5 mg total) by mouth daily.  30 tablet  6  . pantoprazole (PROTONIX) 40 MG tablet Take 40 mg by mouth 2 (two) times daily.      . simvastatin (ZOCOR) 80 MG tablet Take 1 tablet (80 mg total) by mouth at bedtime.  30 tablet  6  . triamcinolone cream (KENALOG) 0.5 %       . zolpidem (AMBIEN) 10 MG tablet Take 10 mg by mouth at bedtime as needed for sleep.      [name] HYDROcodone-acetaminophen (NORCO) 7.5-325 MG per tablet        No current facility-administered medications for this visit.    Allergies  Allergen Reactions  . Metoprolol Other (See Comments)    Fatigue and malaise  . Amoxicillin-Pot Clavulanate     Unknown reaction - possibly rash or increased heart (patient cannot remember)   . Levofloxacin     Unknown  reaction  . Meloxicam     Unknown reaction  . Metaxalone     Unknown reaction  . Sulfonamide Derivatives     unknown reaction - bad rash    History   Social History  . Marital Status: Married    Spouse Name: N/A    Number of Children: N/A  . Years of Education: N/A   Occupational History  . Not on file.   Social History Main Topics  . Smoking status: Former Smoker -- 1.00 packs/day for 54 years    Types: Cigarettes    Quit date: 09/05/2012  . Smokeless tobacco: Not on file  . Alcohol Use: No  . Drug Use: No  . Sexually Active: Not on file   Other Topics Concern  . Not on file   Social History Narrative  . No narrative on file     Review of Systems: General: negative for chills, fever, night sweats or weight changes.  Cardiovascular: negative for chest pain, dyspnea on exertion, edema, orthopnea, palpitations, paroxysmal nocturnal dyspnea or shortness of breath Dermatological: negative for rash Respiratory: negative for cough or wheezing Urologic: negative for hematuria  Abdominal: negative for nausea, vomiting, diarrhea, bright red blood per rectum, melena, or hematemesis Neurologic: negative for visual changes, syncope, or dizziness All other systems reviewed and are otherwise negative except as noted above.    Blood pressure 116/76, pulse 43, height 5' 9.5" (1.765 m), weight 189 lb 6.4 oz (85.911 kg).  General appearance: alert, cooperative and no distress Lungs: clear to auscultation bilaterally Heart: regular rate and rhythm  EKG  EKG: normal EKG, normal sinus rhythm, unchanged from previous tracings, sinus bradycardia.  ASSESSMENT AND PLAN:   Bradycardia HR 43 in the office today  Dizziness No syncope or pre syncope  PAF (paroxysmal atrial fibrillation) Post CABG. Amiodarone stopped 2 weeks ago  CAD- CABG X 17 August 2012 Stable   PLAN  Stop Losartan. Check 2 week monitor, follow up with Dr Alanda Amass.   [facility] KPA-C 11/04/2012 5:45 PM

## 2012-11-04 NOTE — Patient Instructions (Signed)
Stop Losartan. Monitor Monday.

## 2012-11-07 DIAGNOSIS — I4891 Unspecified atrial fibrillation: Secondary | ICD-10-CM | POA: Diagnosis not present

## 2012-11-07 DIAGNOSIS — M6281 Muscle weakness (generalized): Secondary | ICD-10-CM | POA: Diagnosis not present

## 2012-11-07 DIAGNOSIS — I2510 Atherosclerotic heart disease of native coronary artery without angina pectoris: Secondary | ICD-10-CM | POA: Diagnosis not present

## 2012-11-09 DIAGNOSIS — I2510 Atherosclerotic heart disease of native coronary artery without angina pectoris: Secondary | ICD-10-CM | POA: Diagnosis not present

## 2012-11-09 DIAGNOSIS — I4891 Unspecified atrial fibrillation: Secondary | ICD-10-CM | POA: Diagnosis not present

## 2012-11-09 DIAGNOSIS — M6281 Muscle weakness (generalized): Secondary | ICD-10-CM | POA: Diagnosis not present

## 2012-11-11 DIAGNOSIS — M6281 Muscle weakness (generalized): Secondary | ICD-10-CM | POA: Diagnosis not present

## 2012-11-11 DIAGNOSIS — I4891 Unspecified atrial fibrillation: Secondary | ICD-10-CM | POA: Diagnosis not present

## 2012-11-11 DIAGNOSIS — I2510 Atherosclerotic heart disease of native coronary artery without angina pectoris: Secondary | ICD-10-CM | POA: Diagnosis not present

## 2012-11-14 DIAGNOSIS — M6281 Muscle weakness (generalized): Secondary | ICD-10-CM | POA: Diagnosis not present

## 2012-11-14 DIAGNOSIS — I4891 Unspecified atrial fibrillation: Secondary | ICD-10-CM | POA: Diagnosis not present

## 2012-11-14 DIAGNOSIS — I2510 Atherosclerotic heart disease of native coronary artery without angina pectoris: Secondary | ICD-10-CM | POA: Diagnosis not present

## 2012-11-16 DIAGNOSIS — I2510 Atherosclerotic heart disease of native coronary artery without angina pectoris: Secondary | ICD-10-CM | POA: Diagnosis not present

## 2012-11-16 DIAGNOSIS — M6281 Muscle weakness (generalized): Secondary | ICD-10-CM | POA: Diagnosis not present

## 2012-11-16 DIAGNOSIS — I4891 Unspecified atrial fibrillation: Secondary | ICD-10-CM | POA: Diagnosis not present

## 2012-11-17 ENCOUNTER — Other Ambulatory Visit: Payer: Self-pay | Admitting: *Deleted

## 2012-11-17 DIAGNOSIS — I4891 Unspecified atrial fibrillation: Secondary | ICD-10-CM | POA: Diagnosis not present

## 2012-11-17 DIAGNOSIS — M6281 Muscle weakness (generalized): Secondary | ICD-10-CM | POA: Diagnosis not present

## 2012-11-17 DIAGNOSIS — I2510 Atherosclerotic heart disease of native coronary artery without angina pectoris: Secondary | ICD-10-CM

## 2012-11-17 DIAGNOSIS — I495 Sick sinus syndrome: Secondary | ICD-10-CM | POA: Diagnosis not present

## 2012-11-17 DIAGNOSIS — R011 Cardiac murmur, unspecified: Secondary | ICD-10-CM

## 2012-11-21 DIAGNOSIS — I4891 Unspecified atrial fibrillation: Secondary | ICD-10-CM | POA: Diagnosis not present

## 2012-11-21 DIAGNOSIS — Z79899 Other long term (current) drug therapy: Secondary | ICD-10-CM | POA: Diagnosis not present

## 2012-11-21 DIAGNOSIS — I2510 Atherosclerotic heart disease of native coronary artery without angina pectoris: Secondary | ICD-10-CM | POA: Diagnosis not present

## 2012-11-21 DIAGNOSIS — M6281 Muscle weakness (generalized): Secondary | ICD-10-CM | POA: Diagnosis not present

## 2012-11-22 DIAGNOSIS — I2510 Atherosclerotic heart disease of native coronary artery without angina pectoris: Secondary | ICD-10-CM | POA: Diagnosis not present

## 2012-11-22 DIAGNOSIS — I4891 Unspecified atrial fibrillation: Secondary | ICD-10-CM | POA: Diagnosis not present

## 2012-11-22 DIAGNOSIS — M6281 Muscle weakness (generalized): Secondary | ICD-10-CM | POA: Diagnosis not present

## 2012-11-23 ENCOUNTER — Ambulatory Visit (HOSPITAL_COMMUNITY)
Admission: RE | Admit: 2012-11-23 | Discharge: 2012-11-23 | Disposition: A | Discharge status: Home / Self Care | Payer: Medicare Other | Source: Ambulatory Visit | Attending: Cardiovascular Disease | Admitting: Cardiovascular Disease

## 2012-11-23 DIAGNOSIS — I4891 Unspecified atrial fibrillation: Secondary | ICD-10-CM | POA: Insufficient documentation

## 2012-11-23 DIAGNOSIS — R011 Cardiac murmur, unspecified: Secondary | ICD-10-CM

## 2012-11-23 DIAGNOSIS — I959 Hypotension, unspecified: Secondary | ICD-10-CM | POA: Insufficient documentation

## 2012-11-23 DIAGNOSIS — R42 Dizziness and giddiness: Secondary | ICD-10-CM | POA: Insufficient documentation

## 2012-11-23 DIAGNOSIS — I2510 Atherosclerotic heart disease of native coronary artery without angina pectoris: Secondary | ICD-10-CM | POA: Diagnosis not present

## 2012-11-23 DIAGNOSIS — R0989 Other specified symptoms and signs involving the circulatory and respiratory systems: Secondary | ICD-10-CM | POA: Diagnosis not present

## 2012-11-23 DIAGNOSIS — R0609 Other forms of dyspnea: Secondary | ICD-10-CM | POA: Diagnosis not present

## 2012-11-23 DIAGNOSIS — I10 Essential (primary) hypertension: Secondary | ICD-10-CM | POA: Diagnosis not present

## 2012-11-23 NOTE — Progress Notes (Signed)
2D Echo Performed 11/23/2012    Aela Bohan, RCS  

## 2012-11-25 ENCOUNTER — Encounter: Payer: Self-pay | Admitting: Cardiovascular Disease

## 2012-11-25 DIAGNOSIS — I959 Hypotension, unspecified: Secondary | ICD-10-CM | POA: Diagnosis not present

## 2012-11-28 ENCOUNTER — Telehealth: Payer: Self-pay | Admitting: Cardiovascular Disease

## 2012-11-28 DIAGNOSIS — I2510 Atherosclerotic heart disease of native coronary artery without angina pectoris: Secondary | ICD-10-CM | POA: Diagnosis not present

## 2012-11-28 DIAGNOSIS — I4891 Unspecified atrial fibrillation: Secondary | ICD-10-CM | POA: Diagnosis not present

## 2012-11-28 DIAGNOSIS — M6281 Muscle weakness (generalized): Secondary | ICD-10-CM | POA: Diagnosis not present

## 2012-11-28 NOTE — Telephone Encounter (Signed)
JC, Mr. Goetze  Is returning your call .[name]  Thanks

## 2012-11-30 DIAGNOSIS — M6281 Muscle weakness (generalized): Secondary | ICD-10-CM | POA: Diagnosis not present

## 2012-11-30 DIAGNOSIS — I4891 Unspecified atrial fibrillation: Secondary | ICD-10-CM | POA: Diagnosis not present

## 2012-11-30 DIAGNOSIS — I2510 Atherosclerotic heart disease of native coronary artery without angina pectoris: Secondary | ICD-10-CM | POA: Diagnosis not present

## 2012-11-30 NOTE — Telephone Encounter (Signed)
Spoke to pt. And results given instructed pt. If he continued to have problems to let us know

## 2012-11-30 NOTE — Telephone Encounter (Signed)
Returning call from Friday. °

## 2012-12-02 DIAGNOSIS — I2510 Atherosclerotic heart disease of native coronary artery without angina pectoris: Secondary | ICD-10-CM | POA: Diagnosis not present

## 2012-12-02 DIAGNOSIS — M6281 Muscle weakness (generalized): Secondary | ICD-10-CM | POA: Diagnosis not present

## 2012-12-02 DIAGNOSIS — I4891 Unspecified atrial fibrillation: Secondary | ICD-10-CM | POA: Diagnosis not present

## 2012-12-05 DIAGNOSIS — M6281 Muscle weakness (generalized): Secondary | ICD-10-CM | POA: Diagnosis not present

## 2012-12-05 DIAGNOSIS — I4891 Unspecified atrial fibrillation: Secondary | ICD-10-CM | POA: Diagnosis not present

## 2012-12-05 DIAGNOSIS — I2510 Atherosclerotic heart disease of native coronary artery without angina pectoris: Secondary | ICD-10-CM | POA: Diagnosis not present

## 2012-12-07 DIAGNOSIS — M6281 Muscle weakness (generalized): Secondary | ICD-10-CM | POA: Diagnosis not present

## 2012-12-07 DIAGNOSIS — I4891 Unspecified atrial fibrillation: Secondary | ICD-10-CM | POA: Diagnosis not present

## 2012-12-07 DIAGNOSIS — I2510 Atherosclerotic heart disease of native coronary artery without angina pectoris: Secondary | ICD-10-CM | POA: Diagnosis not present

## 2012-12-08 DIAGNOSIS — M9981 Other biomechanical lesions of cervical region: Secondary | ICD-10-CM | POA: Diagnosis not present

## 2012-12-08 DIAGNOSIS — M999 Biomechanical lesion, unspecified: Secondary | ICD-10-CM | POA: Diagnosis not present

## 2012-12-08 DIAGNOSIS — M47812 Spondylosis without myelopathy or radiculopathy, cervical region: Secondary | ICD-10-CM | POA: Diagnosis not present

## 2012-12-09 DIAGNOSIS — M9981 Other biomechanical lesions of cervical region: Secondary | ICD-10-CM | POA: Diagnosis not present

## 2012-12-09 DIAGNOSIS — I2510 Atherosclerotic heart disease of native coronary artery without angina pectoris: Secondary | ICD-10-CM | POA: Diagnosis not present

## 2012-12-09 DIAGNOSIS — M47812 Spondylosis without myelopathy or radiculopathy, cervical region: Secondary | ICD-10-CM | POA: Diagnosis not present

## 2012-12-09 DIAGNOSIS — I4891 Unspecified atrial fibrillation: Secondary | ICD-10-CM | POA: Diagnosis not present

## 2012-12-09 DIAGNOSIS — M999 Biomechanical lesion, unspecified: Secondary | ICD-10-CM | POA: Diagnosis not present

## 2012-12-09 DIAGNOSIS — M6281 Muscle weakness (generalized): Secondary | ICD-10-CM | POA: Diagnosis not present

## 2012-12-12 DIAGNOSIS — I4891 Unspecified atrial fibrillation: Secondary | ICD-10-CM | POA: Diagnosis not present

## 2012-12-12 DIAGNOSIS — M25519 Pain in unspecified shoulder: Secondary | ICD-10-CM | POA: Diagnosis not present

## 2012-12-12 DIAGNOSIS — M6281 Muscle weakness (generalized): Secondary | ICD-10-CM | POA: Diagnosis not present

## 2012-12-12 DIAGNOSIS — I2510 Atherosclerotic heart disease of native coronary artery without angina pectoris: Secondary | ICD-10-CM | POA: Diagnosis not present

## 2012-12-13 DIAGNOSIS — J449 Chronic obstructive pulmonary disease, unspecified: Secondary | ICD-10-CM | POA: Diagnosis not present

## 2012-12-13 DIAGNOSIS — J4489 Other specified chronic obstructive pulmonary disease: Secondary | ICD-10-CM | POA: Diagnosis not present

## 2012-12-13 DIAGNOSIS — I10 Essential (primary) hypertension: Secondary | ICD-10-CM | POA: Diagnosis not present

## 2012-12-14 DIAGNOSIS — I2510 Atherosclerotic heart disease of native coronary artery without angina pectoris: Secondary | ICD-10-CM | POA: Diagnosis not present

## 2012-12-14 DIAGNOSIS — M6281 Muscle weakness (generalized): Secondary | ICD-10-CM | POA: Diagnosis not present

## 2012-12-14 DIAGNOSIS — I4891 Unspecified atrial fibrillation: Secondary | ICD-10-CM | POA: Diagnosis not present

## 2012-12-16 DIAGNOSIS — M6281 Muscle weakness (generalized): Secondary | ICD-10-CM | POA: Diagnosis not present

## 2012-12-16 DIAGNOSIS — I4891 Unspecified atrial fibrillation: Secondary | ICD-10-CM | POA: Diagnosis not present

## 2012-12-16 DIAGNOSIS — I2510 Atherosclerotic heart disease of native coronary artery without angina pectoris: Secondary | ICD-10-CM | POA: Diagnosis not present

## 2012-12-19 ENCOUNTER — Other Ambulatory Visit: Payer: Self-pay | Admitting: *Deleted

## 2012-12-19 DIAGNOSIS — M6281 Muscle weakness (generalized): Secondary | ICD-10-CM | POA: Diagnosis not present

## 2012-12-19 DIAGNOSIS — E782 Mixed hyperlipidemia: Secondary | ICD-10-CM

## 2012-12-19 DIAGNOSIS — I4891 Unspecified atrial fibrillation: Secondary | ICD-10-CM | POA: Diagnosis not present

## 2012-12-19 DIAGNOSIS — I2510 Atherosclerotic heart disease of native coronary artery without angina pectoris: Secondary | ICD-10-CM | POA: Diagnosis not present

## 2012-12-20 ENCOUNTER — Encounter: Payer: Self-pay | Admitting: Cardiovascular Disease

## 2012-12-21 ENCOUNTER — Other Ambulatory Visit: Payer: Self-pay

## 2012-12-26 DIAGNOSIS — I10 Essential (primary) hypertension: Secondary | ICD-10-CM | POA: Diagnosis not present

## 2012-12-30 DIAGNOSIS — M7530 Calcific tendinitis of unspecified shoulder: Secondary | ICD-10-CM | POA: Diagnosis not present

## 2012-12-30 DIAGNOSIS — M25519 Pain in unspecified shoulder: Secondary | ICD-10-CM | POA: Diagnosis not present

## 2012-12-30 DIAGNOSIS — M19019 Primary osteoarthritis, unspecified shoulder: Secondary | ICD-10-CM | POA: Diagnosis not present

## 2013-01-25 ENCOUNTER — Ambulatory Visit (INDEPENDENT_AMBULATORY_CARE_PROVIDER_SITE_OTHER): Payer: Medicare Other | Admitting: Internal Medicine

## 2013-01-25 ENCOUNTER — Encounter: Payer: Self-pay | Admitting: Internal Medicine

## 2013-01-25 VITALS — BP 140/82 | HR 75 | Ht 69.0 in | Wt 196.2 lb

## 2013-01-25 DIAGNOSIS — F17200 Nicotine dependence, unspecified, uncomplicated: Secondary | ICD-10-CM

## 2013-01-25 DIAGNOSIS — J42 Unspecified chronic bronchitis: Secondary | ICD-10-CM

## 2013-01-25 DIAGNOSIS — Z23 Encounter for immunization: Secondary | ICD-10-CM

## 2013-01-25 MED ORDER — MOMETASONE FURO-FORMOTEROL FUM 100-5 MCG/ACT IN AERO: 2.0000 | INHALATION_SPRAY | Freq: Two times a day (BID) | RESPIRATORY_TRACT | Status: DC

## 2013-01-25 MED ORDER — TIOTROPIUM BROMIDE MONOHYDRATE 18 MCG IN CAPS: 18.0000 ug | ORAL_CAPSULE | Freq: Every day | RESPIRATORY_TRACT | Status: DC

## 2013-01-25 NOTE — Patient Instructions (Addendum)
Sample Dulera 100   2 puffs then rinse mouth well, twice daily     Try this instead of the 200  Sample Spiriva 1 daily  Flu vax

## 2013-01-25 NOTE — Progress Notes (Signed)
01/25/13- 58 yoM former smoker (54 pk yrs) Former Investment banker, corporate and re-establish for asthma/ bronchitis complicated by CAD, GERD He had been followed here in 2011 for asthma with bronchitis complicated by GERD He thought it was time for a checkup, having noticed some increased chest congestion in the last 3 months. Persistent dry cough started 2 or 3 months after 2V CABG April, 2014. Primary physician gave prednisone for arthralgias, and amoxicillin. Tried a pro air inhaler in June for mild wheeze. Dropped off of Dulera inhaler which made his throat sore. Occasional brown phlegm. Denies blood, fever or shortness of breath with exertion Quit smoking April, 2014. Previous diagnosis of sleep apnea. PFT 06/26/09- WNL FEV1/FVC 0.75. CXR 10/11/12 IMPRESSION:  Improved effusions as described. No other focal abnormality is  noted.  Original Report Authenticated By: Alcide Clever, M.D.  Prior to Admission medications   Medication Sig Start Date End Date Taking? Authorizing Provider  albuterol (PROVENTIL HFA;VENTOLIN HFA) 108 (90 BASE) MCG/ACT inhaler Inhale 2 puffs into the lungs every 6 (six) hours as needed for wheezing.   Yes Historical Provider, MD  aspirin EC 81 MG tablet Take 81 mg by mouth daily.   Yes Historical Provider, MD  cholestyramine Lanetta Inch) 4 G packet Take 1 packet by mouth 2 (two) times daily with a meal. 10/20/12  Yes Governor Rooks, MD  clopidogrel (PLAVIX) 75 MG tablet Take 75 mg by mouth daily.   Yes Historical Provider, MD  ezetimibe (ZETIA) 10 MG tablet Take 10 mg by mouth every evening.   Yes Historical Provider, MD  levothyroxine (SYNTHROID, LEVOTHROID) 50 MCG tablet Take 1 tablet by mouth daily. 01/20/13  Yes Historical Provider, MD  mometasone-formoterol (DULERA) 200-5 MCG/ACT AERO Inhale 2 puffs into the lungs 2 (two) times daily.   Yes Historical Provider, MD  pantoprazole (PROTONIX) 40 MG tablet Take 40 mg by mouth 2 (two) times daily.   Yes Historical Provider, MD   simvastatin (ZOCOR) 80 MG tablet Take 1 tablet (80 mg total) by mouth at bedtime. 10/11/12  Yes Loreli Slot, MD  zolpidem (AMBIEN) 10 MG tablet Take 10 mg by mouth at bedtime as needed for sleep.   Yes Historical Provider, MD  mometasone-formoterol (DULERA) 100-5 MCG/ACT AERO Inhale 2 puffs into the lungs 2 (two) times daily. 01/25/13   Waymon Budge, MD  tiotropium (SPIRIVA) 18 MCG inhalation capsule Place 1 capsule (18 mcg total) into inhaler and inhale daily. 01/25/13   Waymon Budge, MD   Past Medical History  Diagnosis Date  . Coronary artery disease     Multiple stents  . Myocardial infarct   . Asthma   . COPD (chronic obstructive pulmonary disease)   . Peripheral vascular disease     prior stenting  . Dyslipidemia   . Hyperlipemia   . Sleep apnea   . Allergic rhinitis    Past Surgical History  Procedure Laterality Date  . Coronary stent placement  '97, '98, '04    seven cardiac stents  . Peripheral vascular dis Bilateral 7/06  8/08    Lt SFA '06, Rt SFA 8/08  . Cholecystectomy  June 2010  . Coronary artery bypass graft N/A 09/12/2012    Procedure: OFF PUMP CORONARY ARTERY BYPASS GRAFTING (CABG);  Surgeon: Loreli Slot, MD;  Location: [facility] OR;  Service: Open Heart Surgery;  Laterality: N/A;  Times two using left internal mammary artery and endoscopically harvested right saphenous vein   Family History  Problem Relation Age of Onset  .  Coronary artery disease Father 68  . Coronary artery disease Brother 26    CABG in 2000   History   Social History  . Marital Status: Married    Spouse Name: N/A    Number of Children: N/A  . Years of Education: N/A   Occupational History  . Not on file.   Social History Main Topics  . Smoking status: Former Smoker -- 1.00 packs/day for 54 years    Types: Cigarettes    Quit date: 09/05/2012  . Smokeless tobacco: Not on file  . Alcohol Use: No  . Drug Use: No  . Sexual Activity: Not on file   Other Topics  Concern  . Not on file   Social History Narrative  . No narrative on file   ROS-see HPI Constitutional:   No-   weight loss, night sweats, fevers, chills, fatigue, lassitude. HEENT:   No-  headaches, difficulty swallowing, tooth/dental problems, sore throat,       No-  sneezing, itching, ear ache, nasal congestion, post nasal drip,  CV:  No-   chest pain, orthopnea, PND, swelling in lower extremities, anasarca, dizziness, palpitations Resp: No-   shortness of breath with exertion or at rest.              No-   productive cough,  + non-productive cough,  No- coughing up of blood.              No-   change in color of mucus.  + wheezing.   Skin: No-   rash or lesions. GI:  No-   heartburn, indigestion, abdominal pain, nausea, vomiting, diarrhea,                 change in bowel habits, loss of appetite GU: No-   dysuria, change in color of urine, no urgency or frequency.  No- flank pain. MS:  No-   joint pain or swelling.  No- decreased range of motion.  No- back pain. Neuro-     nothing unusual Psych:  No- change in mood or affect. No depression or anxiety.  No memory loss.  OBJ- Physical Exam General- Alert, Oriented, Affect-appropriate, Distress- none acute Skin- rash-none, lesions- none, excoriation- none Lymphadenopathy- none Head- atraumatic            Eyes- Gross vision intact, PERRLA, conjunctivae and secretions clear            Ears- Hearing, canals-normal            Nose- Clear, no-Septal dev, mucus, polyps, erosion, perforation             Throat- Mallampati II , mucosa clear , drainage- none, tonsils- atrophic, +raspy voice, dentures Neck- flexible , trachea midline, no stridor , thyroid nl, carotid no bruit Chest - symmetrical excursion , unlabored           Heart/CV- RRR , no murmur , no gallop  , no rub, nl s1 s2                           - JVD- none , edema- none, stasis changes- none, varices- none           Lung- clear to P&A, wheeze- none, cough- none ,  dullness-none, rub- none           Chest wall-  Abd- tender-no, distended-no, bowel sounds-present, HSM- no Br/ Gen/ Rectal- Not done, not indicated Extrem- cyanosis- none, clubbing,  none, atrophy- none, strength- nl Neuro- grossly intact to observation

## 2013-02-04 NOTE — Assessment & Plan Note (Signed)
Stopped when he had MI/ CABG. Strongly motivated not to restart.

## 2013-02-04 NOTE — Assessment & Plan Note (Signed)
Plan- adjust Dulera to reduce hoarseness, give sample Spiriva and Flu vax. Anticipate need for PFT

## 2013-02-09 DIAGNOSIS — M25519 Pain in unspecified shoulder: Secondary | ICD-10-CM | POA: Diagnosis not present

## 2013-02-09 DIAGNOSIS — M4802 Spinal stenosis, cervical region: Secondary | ICD-10-CM | POA: Diagnosis not present

## 2013-02-09 DIAGNOSIS — M19019 Primary osteoarthritis, unspecified shoulder: Secondary | ICD-10-CM | POA: Diagnosis not present

## 2013-02-14 DIAGNOSIS — M25819 Other specified joint disorders, unspecified shoulder: Secondary | ICD-10-CM | POA: Diagnosis not present

## 2013-02-14 DIAGNOSIS — M542 Cervicalgia: Secondary | ICD-10-CM | POA: Diagnosis not present

## 2013-02-14 DIAGNOSIS — M25519 Pain in unspecified shoulder: Secondary | ICD-10-CM | POA: Diagnosis not present

## 2013-02-22 ENCOUNTER — Encounter: Payer: Self-pay | Admitting: Internal Medicine

## 2013-02-22 ENCOUNTER — Ambulatory Visit (INDEPENDENT_AMBULATORY_CARE_PROVIDER_SITE_OTHER): Payer: Medicare Other | Admitting: Internal Medicine

## 2013-02-22 VITALS — BP 140/82 | HR 55 | Ht 69.0 in | Wt 196.4 lb

## 2013-02-22 DIAGNOSIS — F17200 Nicotine dependence, unspecified, uncomplicated: Secondary | ICD-10-CM | POA: Diagnosis not present

## 2013-02-22 DIAGNOSIS — J42 Unspecified chronic bronchitis: Secondary | ICD-10-CM

## 2013-02-22 MED ORDER — AEROCHAMBER MV MISC: Status: DC

## 2013-02-22 NOTE — Patient Instructions (Signed)
Script for Aerochamber spacer tube to use with [facility]- See if this reduces the throat irritation but still allows the [facility] to manage your chronic bronchitis

## 2013-02-22 NOTE — Progress Notes (Signed)
01/25/13- 5 yoM former smoker (54 pk yrs) Former Investment banker, corporate and re-establish for asthma/ bronchitis complicated by CAD, GERD He had been followed here in 2011 for asthma with bronchitis complicated by GERD He thought it was time for a checkup, having noticed some increased chest congestion in the last 3 months. Persistent dry cough started 2 or 3 months after 2V CABG April, 2014. Primary physician gave prednisone for arthralgias, and amoxicillin. Tried a pro air inhaler in June for mild wheeze. Dropped off of Dulera inhaler which made his throat sore. Occasional brown phlegm. Denies blood, fever or shortness of breath with exertion Quit smoking April, 2014. Previous diagnosis of sleep apnea. PFT 06/26/09- WNL FEV1/FVC 0.75. CXR 10/11/12 IMPRESSION:  Improved effusions as described. No other focal abnormality is  noted.  Original Report Authenticated By: Alcide Clever, M.D.  02/22/13- 33 yoM former smoker (54 pk yrs) Former Patient- re-established for asthma/ bronchitis complicated by CAD/ CABG, GERD FOLLOWS FOR:  Still having some chest congestion. Had flu vax. Just off prednisone for shoulder. Still min congestion chest. Scant clear phlegm. Took Dulera 200, the 100. Both made him very hoarse despite rinse, so he quit. Used sample Spiriva.  ROS-see HPI Constitutional:   No-   weight loss, night sweats, fevers, chills, fatigue, lassitude. HEENT:   No-  headaches, difficulty swallowing, tooth/dental problems, sore throat,       No-  sneezing, itching, ear ache, nasal congestion, post nasal drip,  CV:  No-   chest pain, orthopnea, PND, swelling in lower extremities, anasarca, dizziness, palpitations Resp: No-   shortness of breath with exertion or at rest.              No-   productive cough,  + non-productive cough,  No- coughing up of blood.              No-   change in color of mucus.  + wheezing.   Skin: No-   rash or lesions. GI:  No-   heartburn, indigestion, abdominal pain, nausea,  vomiting,  GU:  MS:  No-   joint pain or swelling.  . Neuro-     nothing unusual Psych:  No- change in mood or affect. No depression or anxiety.  No memory loss.  OBJ- Physical Exam General- Alert, Oriented, Affect-appropriate, Distress- none acute Skin- rash-none, lesions- none, excoriation- none Lymphadenopathy- none Head- atraumatic            Eyes- Gross vision intact, PERRLA, conjunctivae and secretions clear            Ears- Hearing, canals-normal            Nose- Clear, no-Septal dev, mucus, polyps, erosion, perforation             Throat- Mallampati II , mucosa clear , drainage- none, tonsils- atrophic, +raspy voice, dentures Neck- flexible , trachea midline, no stridor , thyroid nl, carotid no bruit Chest - symmetrical excursion , unlabored           Heart/CV- RRR , no murmur , no gallop  , no rub, nl s1 s2                           - JVD- none , edema- none, stasis changes- none, varices- none           Lung- + coarse breath sounds, wheeze- none, cough- none , dullness-none, rub- none  Chest wall-  Abd-  Br/ Gen/ Rectal- Not done, not indicated Extrem- cyanosis- none, clubbing, none, atrophy- none, strength- nl Neuro- grossly intact to observation

## 2013-02-23 DIAGNOSIS — M25819 Other specified joint disorders, unspecified shoulder: Secondary | ICD-10-CM | POA: Diagnosis not present

## 2013-02-24 DIAGNOSIS — Z125 Encounter for screening for malignant neoplasm of prostate: Secondary | ICD-10-CM | POA: Diagnosis not present

## 2013-02-24 DIAGNOSIS — R300 Dysuria: Secondary | ICD-10-CM | POA: Diagnosis not present

## 2013-02-24 DIAGNOSIS — R82998 Other abnormal findings in urine: Secondary | ICD-10-CM | POA: Diagnosis not present

## 2013-03-02 DIAGNOSIS — M542 Cervicalgia: Secondary | ICD-10-CM | POA: Diagnosis not present

## 2013-03-02 DIAGNOSIS — M25819 Other specified joint disorders, unspecified shoulder: Secondary | ICD-10-CM | POA: Diagnosis not present

## 2013-03-02 DIAGNOSIS — M25519 Pain in unspecified shoulder: Secondary | ICD-10-CM | POA: Diagnosis not present

## 2013-03-02 DIAGNOSIS — M75120 Complete rotator cuff tear or rupture of unspecified shoulder, not specified as traumatic: Secondary | ICD-10-CM | POA: Diagnosis not present

## 2013-03-09 NOTE — Assessment & Plan Note (Signed)
Intends to stay off tobacco

## 2013-03-09 NOTE — Assessment & Plan Note (Signed)
Mild residual sense of chest congestion may be chronic bronchitis vs low level pulmonary vascular congestion. Plan- Add Aerochamber spacer to see if he can tolerate longer trial of Dulera.

## 2013-03-14 ENCOUNTER — Encounter: Payer: Self-pay | Admitting: Cardiovascular Disease

## 2013-03-14 ENCOUNTER — Ambulatory Visit (INDEPENDENT_AMBULATORY_CARE_PROVIDER_SITE_OTHER): Payer: Medicare Other | Admitting: Cardiovascular Disease

## 2013-03-14 VITALS — BP 140/80 | HR 76 | Ht 69.0 in | Wt 193.8 lb

## 2013-03-14 DIAGNOSIS — Z0000 Encounter for general adult medical examination without abnormal findings: Secondary | ICD-10-CM | POA: Diagnosis not present

## 2013-03-14 DIAGNOSIS — R5381 Other malaise: Secondary | ICD-10-CM

## 2013-03-14 DIAGNOSIS — N3000 Acute cystitis without hematuria: Secondary | ICD-10-CM | POA: Diagnosis not present

## 2013-03-14 DIAGNOSIS — I10 Essential (primary) hypertension: Secondary | ICD-10-CM

## 2013-03-14 DIAGNOSIS — I739 Peripheral vascular disease, unspecified: Secondary | ICD-10-CM

## 2013-03-14 DIAGNOSIS — I2510 Atherosclerotic heart disease of native coronary artery without angina pectoris: Secondary | ICD-10-CM | POA: Diagnosis not present

## 2013-03-14 DIAGNOSIS — K648 Other hemorrhoids: Secondary | ICD-10-CM | POA: Diagnosis not present

## 2013-03-14 DIAGNOSIS — R5383 Other fatigue: Secondary | ICD-10-CM

## 2013-03-14 DIAGNOSIS — Z79899 Other long term (current) drug therapy: Secondary | ICD-10-CM | POA: Diagnosis not present

## 2013-03-14 DIAGNOSIS — E119 Type 2 diabetes mellitus without complications: Secondary | ICD-10-CM | POA: Diagnosis not present

## 2013-03-14 DIAGNOSIS — Z01818 Encounter for other preprocedural examination: Secondary | ICD-10-CM

## 2013-03-14 DIAGNOSIS — E785 Hyperlipidemia, unspecified: Secondary | ICD-10-CM

## 2013-03-14 DIAGNOSIS — K602 Anal fissure, unspecified: Secondary | ICD-10-CM | POA: Diagnosis not present

## 2013-03-14 MED ORDER — ATORVASTATIN CALCIUM 40 MG PO TABS: 40.0000 mg | ORAL_TABLET | Freq: Every day | ORAL | Status: DC

## 2013-03-14 MED ORDER — LOSARTAN POTASSIUM 50 MG PO TABS: 50.0000 mg | ORAL_TABLET | Freq: Every day | ORAL | Status: DC

## 2013-03-14 NOTE — Patient Instructions (Addendum)
Your physician has recommended you make the following change in your medication: Stop Simvastatin, Start Atorvastatin 40 mg, Start losartan 50 mg  Your physician recommends that you return for lab work CMP, CBC, NMR LIPIDS, TSH  Your physician recommends that you return for lab work in: 2 weeks BMP  You are cleared for surgery   Your physician recommends that you schedule a follow-up appointment in: 2-3 months

## 2013-03-23 DIAGNOSIS — E119 Type 2 diabetes mellitus without complications: Secondary | ICD-10-CM | POA: Diagnosis not present

## 2013-03-24 DIAGNOSIS — S43499A Other sprain of unspecified shoulder joint, initial encounter: Secondary | ICD-10-CM | POA: Diagnosis not present

## 2013-03-24 DIAGNOSIS — M75110 Incomplete rotator cuff tear or rupture of unspecified shoulder, not specified as traumatic: Secondary | ICD-10-CM | POA: Diagnosis not present

## 2013-03-24 DIAGNOSIS — G8918 Other acute postprocedural pain: Secondary | ICD-10-CM | POA: Diagnosis not present

## 2013-03-24 DIAGNOSIS — M25819 Other specified joint disorders, unspecified shoulder: Secondary | ICD-10-CM | POA: Diagnosis not present

## 2013-03-24 DIAGNOSIS — S46819A Strain of other muscles, fascia and tendons at shoulder and upper arm level, unspecified arm, initial encounter: Secondary | ICD-10-CM | POA: Diagnosis not present

## 2013-03-24 DIAGNOSIS — M24119 Other articular cartilage disorders, unspecified shoulder: Secondary | ICD-10-CM | POA: Diagnosis not present

## 2013-03-29 ENCOUNTER — Other Ambulatory Visit (HOSPITAL_COMMUNITY): Payer: Self-pay | Admitting: Internal Medicine

## 2013-03-29 ENCOUNTER — Encounter (HOSPITAL_COMMUNITY): Payer: Self-pay | Admitting: *Deleted

## 2013-03-29 DIAGNOSIS — I739 Peripheral vascular disease, unspecified: Secondary | ICD-10-CM

## 2013-03-30 DIAGNOSIS — E291 Testicular hypofunction: Secondary | ICD-10-CM | POA: Diagnosis not present

## 2013-03-30 DIAGNOSIS — Z79899 Other long term (current) drug therapy: Secondary | ICD-10-CM | POA: Diagnosis not present

## 2013-04-04 DIAGNOSIS — M6281 Muscle weakness (generalized): Secondary | ICD-10-CM | POA: Diagnosis not present

## 2013-04-04 DIAGNOSIS — M25519 Pain in unspecified shoulder: Secondary | ICD-10-CM | POA: Diagnosis not present

## 2013-04-04 DIAGNOSIS — M75120 Complete rotator cuff tear or rupture of unspecified shoulder, not specified as traumatic: Secondary | ICD-10-CM | POA: Diagnosis not present

## 2013-04-07 DIAGNOSIS — M75120 Complete rotator cuff tear or rupture of unspecified shoulder, not specified as traumatic: Secondary | ICD-10-CM | POA: Diagnosis not present

## 2013-04-07 DIAGNOSIS — M6281 Muscle weakness (generalized): Secondary | ICD-10-CM | POA: Diagnosis not present

## 2013-04-07 DIAGNOSIS — M25519 Pain in unspecified shoulder: Secondary | ICD-10-CM | POA: Diagnosis not present

## 2013-04-10 DIAGNOSIS — M75120 Complete rotator cuff tear or rupture of unspecified shoulder, not specified as traumatic: Secondary | ICD-10-CM | POA: Diagnosis not present

## 2013-04-10 DIAGNOSIS — M6281 Muscle weakness (generalized): Secondary | ICD-10-CM | POA: Diagnosis not present

## 2013-04-10 DIAGNOSIS — M25519 Pain in unspecified shoulder: Secondary | ICD-10-CM | POA: Diagnosis not present

## 2013-04-11 ENCOUNTER — Encounter: Payer: Self-pay | Admitting: Cardiovascular Disease

## 2013-04-12 DIAGNOSIS — M6281 Muscle weakness (generalized): Secondary | ICD-10-CM | POA: Diagnosis not present

## 2013-04-12 DIAGNOSIS — M25519 Pain in unspecified shoulder: Secondary | ICD-10-CM | POA: Diagnosis not present

## 2013-04-12 DIAGNOSIS — M75120 Complete rotator cuff tear or rupture of unspecified shoulder, not specified as traumatic: Secondary | ICD-10-CM | POA: Diagnosis not present

## 2013-04-15 ENCOUNTER — Encounter: Payer: Self-pay | Admitting: Cardiovascular Disease

## 2013-04-15 DIAGNOSIS — I10 Essential (primary) hypertension: Secondary | ICD-10-CM | POA: Insufficient documentation

## 2013-04-15 NOTE — Progress Notes (Signed)
Patient ID: Brian Brock, male   DOB: 1945-06-16, 67 y.o.   MRN: [id number]     HPI: Brian Brock is a 67 y.o. male who is a former patient of Dr. Susa Griffins. He now presents to the office today for preoperative evaluation prior to undergoing rotator cuff surgery to be done by Dr. Isaias Cowman.  Brian Brock has known coronary artery disease and has undergone prior percutaneous coronary interventions with stenting to his LAD and right coronary artery in 1997, 1998, and in 2004 to his proximal left circumflex coronary artery. He also has established peripheral vascular disease and underwent stenting to his left SFA in 2004 and right SFA in 2008. There is a history of tobacco use, hyperlipidemia in April 2004 he presented to [facility] with chest pain and T wave abnormalities. He was seen by Dr. Allyson Sabal at that time and a nuclear perfusion study demonstrated reversible ischemia in the apical segment of the anterior wall. He underwent repeat cardiac catheterization on 09/07/2012 by me and was found to have preserved LV function with minimal focal region of mild inferior hypocontractility. He had significant progression of CAD with ostial 90% eccentric stenosis of the LAD extending into the distal left main proximal to a previously placed proximal LAD stent. There also is 90% stenosis of the LAD after the stented segment. The circumflex vessel is 60% ostial stenosis followed by 60% proximal stenosis prior to the previously placed stent. The RCA stent was patent.  CABG surgery was recommended and this was done by Dr. Dorris Fetch on 09/12/2012 with an off-pump LIMA to LAD and SVG to circumflex marginal 1 vessel. The patient has seen Dr. Alanda Amass in follow-up on 12/19/2012. He now is in need for rotator cuff surgery. He denies recent chest pain. He denies awareness of palpitations. He does have a history of hypertension, hyperlipidemia, hypothyroidism, and peripheral vascular disease.  Past  Medical History  Diagnosis Date  . Coronary artery disease     Multiple stents  . Myocardial infarct   . Asthma   . COPD (chronic obstructive pulmonary disease)   . Peripheral vascular disease     prior stenting  . Dyslipidemia   . Hyperlipemia   . Sleep apnea   . Allergic rhinitis     Past Surgical History  Procedure Laterality Date  . Coronary stent placement  '97, '98, '04    seven cardiac stents  . Peripheral vascular dis Bilateral 7/06  8/08    Lt SFA '06, Rt SFA 8/08  . Cholecystectomy  June 2010  . Coronary artery bypass graft N/A 09/12/2012    Procedure: OFF PUMP CORONARY ARTERY BYPASS GRAFTING (CABG);  Surgeon: Loreli Slot, MD;  Location: [facility] OR;  Service: Open Heart Surgery;  Laterality: N/A;  Times two using left internal mammary artery and endoscopically harvested right saphenous vein    Allergies  Allergen Reactions  . Metoprolol Other (See Comments)    Fatigue and malaise  . Amoxicillin-Pot Clavulanate     Unknown reaction - possibly rash or increased heart (patient cannot remember)   . Levofloxacin     Unknown reaction  . Meloxicam     Unknown reaction  . Metaxalone     Unknown reaction  . Sulfonamide Derivatives     unknown reaction - bad rash    Current Outpatient Prescriptions  Medication Sig Dispense Refill  . albuterol (PROVENTIL HFA;VENTOLIN HFA) 108 (90 BASE) MCG/ACT inhaler Inhale 2 puffs into the lungs every 6 (six) hours  as needed for wheezing.      [name] aspirin EC 81 MG tablet Take 81 mg by mouth daily.      . cetirizine (ZYRTEC) 10 MG tablet Take 10 mg by mouth daily.      . cholestyramine (QUESTRAN) 4 G packet Take 1 packet by mouth 2 (two) times daily with a meal.  60 each  11  . clopidogrel (PLAVIX) 75 MG tablet Take 75 mg by mouth daily.      [name] HYDROcodone-acetaminophen (NORCO) 7.5-325 MG per tablet Take 1 tablet by mouth every 6 (six) hours as needed for pain.      [name] levothyroxine (SYNTHROID, LEVOTHROID) 50 MCG tablet Take 1 tablet  by mouth daily.      . mometasone-formoterol (DULERA) 100-5 MCG/ACT AERO Inhale 2 puffs into the lungs 2 (two) times daily.  1 Inhaler  0  . NON FORMULARY daily. lacithin granules 2 TBSP daily.      . pantoprazole (PROTONIX) 40 MG tablet Take 40 mg by mouth 2 (two) times daily.      . simvastatin (ZOCOR) 80 MG tablet Take 1 tablet (80 mg total) by mouth at bedtime.  30 tablet  6  . Spacer/Aero-Holding Chambers (AEROCHAMBER MV) inhaler Use as instructed  1 each  0  . zolpidem (AMBIEN) 10 MG tablet Take 10 mg by mouth at bedtime as needed for sleep.      [name] atorvastatin (LIPITOR) 40 MG tablet Take 1 tablet (40 mg total) by mouth daily.  30 tablet  11  . ezetimibe (ZETIA) 10 MG tablet Take 10 mg by mouth every evening.      [name] losartan (COZAAR) 50 MG tablet Take 1 tablet (50 mg total) by mouth daily.  30 tablet  11   No current facility-administered medications for this visit.    History   Social History  . Marital Status: Married    Spouse Name: N/A    Number of Children: N/A  . Years of Education: N/A   Occupational History  . Not on file.   Social History Main Topics  . Smoking status: Former Smoker -- 1.00 packs/day for 54 years    Types: Cigarettes    Quit date: 09/05/2012  . Smokeless tobacco: Not on file  . Alcohol Use: No  . Drug Use: No  . Sexual Activity: Not on file   Other Topics Concern  . Not on file   Social History Narrative  . No narrative on file    Family History  Problem Relation Age of Onset  . Coronary artery disease Father 6  . 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 He is married has one child 4 grandchildren. He quit smoking in April 2014 after 54 years of tobacco use.  ROS is negative for fevers, chills or night sweats.  He denies skin rash. He denies visual changes. He denies change in hearing. He does take albuterol on an  as-needed basis for wheezing. He denies chest pressure. He denies recurrent anginal type symptoms. He denies change in bowel or bladder habits. He is unaware of bleeding in his urine but in the past has had bleeding issues rectally. He has been on Plavix. He denies recent claudication. He denies significant leg swelling. Other comprehensive 12 point system review is negative.  PE BP 140/80  Pulse 76  Ht 5\' 9"  (  1.753 m)  Wt 193 lb 12.8 oz (87.907 kg)  BMI 28.61 kg/m2  General: Alert, oriented, no distress.  Skin: normal turgor, no rashes HEENT: Normocephalic, atraumatic. Pupils round and reactive; sclera anicteric;no lid lag.  Nose without nasal septal hypertrophy Mouth/Parynx benign; Mallinpatti scale 3 Neck: No JVD, no carotid briuts Lungs: Decreased breath sounds without audible wheezing or rales Heart: RRR, s1 s2 normal with a 2/6 systolic murmur at the left sternal border. No diastolic murmur. Abdomen: soft, nontender; no hepatosplenomehaly, BS+; abdominal aorta nontender and not dilated by palpation. Pulses: Slightly decreased pulses at the dorsalis pedis and PT bilaterally. Extremities: no clubbing cyanosis or edema, Homan's sign negative  Neurologic: grossly nonfocal Psychologic: normal affect and mood.  ECG: Normal sinus rhythm at 76 beats per minute  LABS:  BMET    Component Value Date/Time   NA 138 09/17/2012 0438   K 3.8 09/17/2012 0438   CL 100 09/17/2012 0438   CO2 28 09/17/2012 0438   GLUCOSE 91 09/17/2012 0438   BUN 15 09/17/2012 0438   CREATININE 1.07 09/17/2012 0438   CALCIUM 8.8 09/17/2012 0438   GFRNONAA 70* 09/17/2012 0438   GFRAA 82* 09/17/2012 0438     Hepatic Function Panel     Component Value Date/Time   PROT 6.4 09/05/2012 1210   ALBUMIN 3.2* 09/05/2012 1210   AST 38* 09/05/2012 1210   ALT 56* 09/05/2012 1210   ALKPHOS 94 09/05/2012 1210   BILITOT 0.5 09/05/2012 1210     CBC    Component Value Date/Time   WBC 6.6 09/17/2012 0438   RBC 3.54* 09/17/2012 0438   HGB  10.2* 09/17/2012 0438   HCT 29.6* 09/17/2012 0438   PLT 197 09/17/2012 0438   MCV 83.6 09/17/2012 0438   MCH 28.8 09/17/2012 0438   MCHC 34.5 09/17/2012 0438   RDW 14.1 09/17/2012 0438   LYMPHSABS 2.9 09/05/2012 1210   MONOABS 1.0 09/05/2012 1210   EOSABS 0.1 09/05/2012 1210   BASOSABS 0.0 09/05/2012 1210     BNP No results found for this basename: probnp    Lipid Panel     Component Value Date/Time   CHOL 155 09/06/2012 0455   TRIG 179* 09/06/2012 0455   HDL 52 09/06/2012 0455   CHOLHDL 3.0 09/06/2012 0455   VLDL 36 09/06/2012 0455   LDLCALC 67 09/06/2012 0455     RADIOLOGY: No results found.    ASSESSMENT AND PLAN: Brian Brock has established coronary as well as peripheral vascular disease. Due to progressive CAD he underwent CABG surgery in April 2014 by Dr. Dorris Fetch. Previously he had undergone prior stenting to his proximal LAD circumflex coronary artery as well as right coronary artery. He is now doing well following his bypass surgery. Blood pressure however continues to be elevated. I am recommending resumption of losartan 50 mg. He apparently has been on simvastatin 80 mg per recommendations I recommend he discontinue simvastatin and change this to atorvastatin 40 mg. He was given clearance for his planned rotator cuff surgery. He will need to discontinue his Plavix. I am checking laboratory consisting of a CBC, CMP, and NMR lipoprofile as well as a TSH level. 2 weeks after initiating losartan therapy a Bmet will be obtained.  I'll see him in the office in 2-3 months for neurologic reassessment.     Lennette Bihari, MD, [facility]  04/15/2013 12:45 PM

## 2013-04-17 DIAGNOSIS — M6281 Muscle weakness (generalized): Secondary | ICD-10-CM | POA: Diagnosis not present

## 2013-04-17 DIAGNOSIS — M25519 Pain in unspecified shoulder: Secondary | ICD-10-CM | POA: Diagnosis not present

## 2013-04-17 DIAGNOSIS — M75120 Complete rotator cuff tear or rupture of unspecified shoulder, not specified as traumatic: Secondary | ICD-10-CM | POA: Diagnosis not present

## 2013-04-18 ENCOUNTER — Ambulatory Visit (HOSPITAL_COMMUNITY)
Admission: RE | Admit: 2013-04-18 | Discharge: 2013-04-18 | Disposition: A | Discharge status: Home / Self Care | Payer: Medicare Other | Source: Ambulatory Visit | Attending: Internal Medicine | Admitting: Internal Medicine

## 2013-04-18 DIAGNOSIS — I70209 Unspecified atherosclerosis of native arteries of extremities, unspecified extremity: Secondary | ICD-10-CM | POA: Insufficient documentation

## 2013-04-18 DIAGNOSIS — I70219 Atherosclerosis of native arteries of extremities with intermittent claudication, unspecified extremity: Secondary | ICD-10-CM | POA: Diagnosis not present

## 2013-04-18 DIAGNOSIS — I708 Atherosclerosis of other arteries: Secondary | ICD-10-CM | POA: Diagnosis not present

## 2013-04-18 DIAGNOSIS — I739 Peripheral vascular disease, unspecified: Secondary | ICD-10-CM | POA: Diagnosis not present

## 2013-04-18 NOTE — Progress Notes (Signed)
Arterial Duplex Lower Ext. Completed. Satomi Buda, BS, RDMS, RVT  

## 2013-04-20 DIAGNOSIS — M6281 Muscle weakness (generalized): Secondary | ICD-10-CM | POA: Diagnosis not present

## 2013-04-20 DIAGNOSIS — M75120 Complete rotator cuff tear or rupture of unspecified shoulder, not specified as traumatic: Secondary | ICD-10-CM | POA: Diagnosis not present

## 2013-04-20 DIAGNOSIS — M25519 Pain in unspecified shoulder: Secondary | ICD-10-CM | POA: Diagnosis not present

## 2013-04-24 DIAGNOSIS — M6281 Muscle weakness (generalized): Secondary | ICD-10-CM | POA: Diagnosis not present

## 2013-04-24 DIAGNOSIS — M75120 Complete rotator cuff tear or rupture of unspecified shoulder, not specified as traumatic: Secondary | ICD-10-CM | POA: Diagnosis not present

## 2013-04-24 DIAGNOSIS — M25519 Pain in unspecified shoulder: Secondary | ICD-10-CM | POA: Diagnosis not present

## 2013-04-27 DIAGNOSIS — J209 Acute bronchitis, unspecified: Secondary | ICD-10-CM | POA: Diagnosis not present

## 2013-04-27 DIAGNOSIS — M75120 Complete rotator cuff tear or rupture of unspecified shoulder, not specified as traumatic: Secondary | ICD-10-CM | POA: Diagnosis not present

## 2013-04-27 DIAGNOSIS — M25519 Pain in unspecified shoulder: Secondary | ICD-10-CM | POA: Diagnosis not present

## 2013-04-27 DIAGNOSIS — J218 Acute bronchiolitis due to other specified organisms: Secondary | ICD-10-CM | POA: Diagnosis not present

## 2013-04-27 DIAGNOSIS — M6281 Muscle weakness (generalized): Secondary | ICD-10-CM | POA: Diagnosis not present

## 2013-05-01 ENCOUNTER — Telehealth: Payer: Self-pay | Admitting: *Deleted

## 2013-05-02 DIAGNOSIS — M6281 Muscle weakness (generalized): Secondary | ICD-10-CM | POA: Diagnosis not present

## 2013-05-02 DIAGNOSIS — M75120 Complete rotator cuff tear or rupture of unspecified shoulder, not specified as traumatic: Secondary | ICD-10-CM | POA: Diagnosis not present

## 2013-05-02 DIAGNOSIS — M25519 Pain in unspecified shoulder: Secondary | ICD-10-CM | POA: Diagnosis not present

## 2013-05-04 DIAGNOSIS — M6281 Muscle weakness (generalized): Secondary | ICD-10-CM | POA: Diagnosis not present

## 2013-05-04 DIAGNOSIS — M25519 Pain in unspecified shoulder: Secondary | ICD-10-CM | POA: Diagnosis not present

## 2013-05-04 DIAGNOSIS — M75120 Complete rotator cuff tear or rupture of unspecified shoulder, not specified as traumatic: Secondary | ICD-10-CM | POA: Diagnosis not present

## 2013-05-08 DIAGNOSIS — M25519 Pain in unspecified shoulder: Secondary | ICD-10-CM | POA: Diagnosis not present

## 2013-05-08 DIAGNOSIS — M75120 Complete rotator cuff tear or rupture of unspecified shoulder, not specified as traumatic: Secondary | ICD-10-CM | POA: Diagnosis not present

## 2013-05-08 DIAGNOSIS — M6281 Muscle weakness (generalized): Secondary | ICD-10-CM | POA: Diagnosis not present

## 2013-05-10 DIAGNOSIS — M75120 Complete rotator cuff tear or rupture of unspecified shoulder, not specified as traumatic: Secondary | ICD-10-CM | POA: Diagnosis not present

## 2013-05-10 DIAGNOSIS — M25519 Pain in unspecified shoulder: Secondary | ICD-10-CM | POA: Diagnosis not present

## 2013-05-10 DIAGNOSIS — M6281 Muscle weakness (generalized): Secondary | ICD-10-CM | POA: Diagnosis not present

## 2013-05-15 DIAGNOSIS — M6281 Muscle weakness (generalized): Secondary | ICD-10-CM | POA: Diagnosis not present

## 2013-05-15 DIAGNOSIS — M75120 Complete rotator cuff tear or rupture of unspecified shoulder, not specified as traumatic: Secondary | ICD-10-CM | POA: Diagnosis not present

## 2013-05-15 DIAGNOSIS — M25519 Pain in unspecified shoulder: Secondary | ICD-10-CM | POA: Diagnosis not present

## 2013-05-17 DIAGNOSIS — M25519 Pain in unspecified shoulder: Secondary | ICD-10-CM | POA: Diagnosis not present

## 2013-05-17 DIAGNOSIS — R5383 Other fatigue: Secondary | ICD-10-CM | POA: Diagnosis not present

## 2013-05-17 DIAGNOSIS — E785 Hyperlipidemia, unspecified: Secondary | ICD-10-CM | POA: Diagnosis not present

## 2013-05-17 DIAGNOSIS — M6281 Muscle weakness (generalized): Secondary | ICD-10-CM | POA: Diagnosis not present

## 2013-05-17 DIAGNOSIS — M75120 Complete rotator cuff tear or rupture of unspecified shoulder, not specified as traumatic: Secondary | ICD-10-CM | POA: Diagnosis not present

## 2013-05-17 DIAGNOSIS — Z79899 Other long term (current) drug therapy: Secondary | ICD-10-CM | POA: Diagnosis not present

## 2013-05-17 DIAGNOSIS — R5381 Other malaise: Secondary | ICD-10-CM | POA: Diagnosis not present

## 2013-05-22 DIAGNOSIS — M25519 Pain in unspecified shoulder: Secondary | ICD-10-CM | POA: Diagnosis not present

## 2013-05-22 DIAGNOSIS — M75120 Complete rotator cuff tear or rupture of unspecified shoulder, not specified as traumatic: Secondary | ICD-10-CM | POA: Diagnosis not present

## 2013-05-22 DIAGNOSIS — M6281 Muscle weakness (generalized): Secondary | ICD-10-CM | POA: Diagnosis not present

## 2013-05-24 ENCOUNTER — Ambulatory Visit: Payer: Medicare Other | Admitting: Cardiovascular Disease

## 2013-05-25 DIAGNOSIS — M6281 Muscle weakness (generalized): Secondary | ICD-10-CM | POA: Diagnosis not present

## 2013-05-25 DIAGNOSIS — M25519 Pain in unspecified shoulder: Secondary | ICD-10-CM | POA: Diagnosis not present

## 2013-05-25 DIAGNOSIS — M47812 Spondylosis without myelopathy or radiculopathy, cervical region: Secondary | ICD-10-CM | POA: Diagnosis not present

## 2013-05-25 DIAGNOSIS — M75120 Complete rotator cuff tear or rupture of unspecified shoulder, not specified as traumatic: Secondary | ICD-10-CM | POA: Diagnosis not present

## 2013-05-25 DIAGNOSIS — M9981 Other biomechanical lesions of cervical region: Secondary | ICD-10-CM | POA: Diagnosis not present

## 2013-05-25 DIAGNOSIS — M999 Biomechanical lesion, unspecified: Secondary | ICD-10-CM | POA: Diagnosis not present

## 2013-05-29 ENCOUNTER — Ambulatory Visit (INDEPENDENT_AMBULATORY_CARE_PROVIDER_SITE_OTHER): Payer: Medicare Other | Admitting: Cardiovascular Disease

## 2013-05-29 ENCOUNTER — Encounter: Payer: Self-pay | Admitting: Cardiovascular Disease

## 2013-05-29 VITALS — BP 154/84 | HR 67 | Ht 69.0 in | Wt 209.7 lb

## 2013-05-29 DIAGNOSIS — M25519 Pain in unspecified shoulder: Secondary | ICD-10-CM | POA: Diagnosis not present

## 2013-05-29 DIAGNOSIS — M6281 Muscle weakness (generalized): Secondary | ICD-10-CM | POA: Diagnosis not present

## 2013-05-29 DIAGNOSIS — M75120 Complete rotator cuff tear or rupture of unspecified shoulder, not specified as traumatic: Secondary | ICD-10-CM | POA: Diagnosis not present

## 2013-05-29 DIAGNOSIS — E8881 Metabolic syndrome: Secondary | ICD-10-CM

## 2013-05-29 DIAGNOSIS — I739 Peripheral vascular disease, unspecified: Secondary | ICD-10-CM

## 2013-05-29 DIAGNOSIS — I10 Essential (primary) hypertension: Secondary | ICD-10-CM

## 2013-05-29 DIAGNOSIS — I2510 Atherosclerotic heart disease of native coronary artery without angina pectoris: Secondary | ICD-10-CM | POA: Diagnosis not present

## 2013-05-29 MED ORDER — LOSARTAN POTASSIUM 100 MG PO TABS: 100.0000 mg | ORAL_TABLET | Freq: Every day | ORAL | Status: DC

## 2013-05-29 MED ORDER — EZETIMIBE 10 MG PO TABS: 10.0000 mg | ORAL_TABLET | Freq: Every day | ORAL | Status: DC

## 2013-05-29 NOTE — Progress Notes (Signed)
Patient ID: Brian Brock, male   DOB: Dec 10, 1945, 68 y.o.   MRN: [id number]     HPI: Brian Brock is a 68 y.o. male who is a former patient of Dr. Terance Ice. He had established cardiology care with me in October prior to undergoing rotator cuff surgery which was done by Dr. Ardyth Gal.   Brian Brock has known coronary artery disease and also has a history of hypertension, hyperlipidemia, hypothyroidism, and peripheral vascular disease. Laboratory is also suggested metabolic syndrome. He has undergone prior percutaneous coronary interventions with stenting to his LAD and right coronary artery in 1997, 1998, and in 2004 to his proximal left circumflex coronary artery. He also has established peripheral vascular disease and underwent stenting to his left SFA in 2004 and right SFA in 2008. There is a history of tobacco use, hyperlipidemia in Brian 2004 he presented to [facility] with chest pain and T wave abnormalities. He was seen by Dr. Gwenlyn Found at that time and a nuclear perfusion study demonstrated reversible ischemia in the apical segment of the anterior wall. He underwent repeat cardiac catheterization on 09/07/2012 by me and was found to have preserved LV function with minimal focal region of mild inferior hypocontractility. He had significant progression of CAD with ostial 90% eccentric stenosis of the LAD extending into the distal left main proximal to a previously placed proximal LAD stent. There also is 90% stenosis of the LAD after the stented segment. The circumflex vessel is 60% ostial stenosis followed by 60% proximal stenosis prior to the previously placed stent. The RCA stent was patent.  CABG surgery was recommended and this was done by Dr. Roxan Hockey on 09/12/2012 with an off-pump LIMA to LAD and SVG to circumflex marginal 1 vessel.  When I saw Brian Brock, I gave him clearance to undergo rotator cuff surgery. He states he tolerated this well. He initially was pain-free  in his left shoulder but recently again has noticed some discomfort. He was also told by Dr. Maxie Better that he has some nerve issues as well that will need to be treated. He denies recent anginal symptoms. He does have a history of peripheral vascular disease and is status post right common femoral and proximal SFA stenting with left SFA stenting. He underwent a one-year followup [place] a duplex study on 04/18/2013. ABIs were excellent bilaterally at 1.1. It is less than 49% diameter reduction noted in the right common iliac, right common femoral and SFA stent, and was less than 50% diameter reduction in the right distal SFA. There was 50-69% diameter reduction in the left external iliac artery and less than 49% and left common femoral. The left SFA stent was widely patent.  Brian Brock also underwent recent lab work by Dr. Unk Lightning. Hemoglobin 14.5 hematocrit 43.43, she profile was normal. Of note serum glucose was 105. An MR profile revealed a significant increase LDL particle #[id number] due to predominant increase in small LDL particles at 1304. He did have significant increased insulin resistance cord 75 concordant with an atherogenic dyslipidemia pattern and metabolic syndrome.  Past Medical History  Diagnosis Date  . Coronary artery disease     Multiple stents  . Myocardial infarct   . Asthma   . COPD (chronic obstructive pulmonary disease)   . Peripheral vascular disease     prior stenting  . Dyslipidemia   . Hyperlipemia   . Sleep apnea   . Allergic rhinitis     Past Surgical History  Procedure Laterality Date  .  Coronary stent placement  '97, '98, '04    seven cardiac stents  . Peripheral vascular dis Bilateral 7/06  8/08    Lt SFA '06, Rt SFA 8/08  . Cholecystectomy  June 2010  . Coronary artery bypass graft N/A 09/12/2012    Procedure: OFF PUMP CORONARY ARTERY BYPASS GRAFTING (CABG);  Surgeon: Melrose Nakayama, MD;  Location: [facility];  Service: Open Heart Surgery;  Laterality: N/A;   Times two using left internal mammary artery and endoscopically harvested right saphenous vein    Allergies  Allergen Reactions  . Metoprolol Other (See Comments)    Fatigue and malaise  . Amoxicillin-Pot Clavulanate     Unknown reaction - possibly rash or increased heart (patient cannot remember)   . Levofloxacin     Unknown reaction  . Meloxicam     Unknown reaction  . Metaxalone     Unknown reaction  . Sulfonamide Derivatives     unknown reaction - bad rash    Current Outpatient Prescriptions  Medication Sig Dispense Refill  . albuterol (PROVENTIL HFA;VENTOLIN HFA) 108 (90 BASE) MCG/ACT inhaler Inhale 2 puffs into the lungs every 6 (six) hours as needed for wheezing.      [name] aspirin EC 81 MG tablet Take 81 mg by mouth daily.      [name] atorvastatin (LIPITOR) 40 MG tablet Take 1 tablet (40 mg total) by mouth daily.  30 tablet  11  . cetirizine (ZYRTEC) 10 MG tablet Take 10 mg by mouth daily.      . cholestyramine (QUESTRAN) 4 G packet Take 1 packet by mouth 2 (two) times daily with a meal.  60 each  11  . HYDROcodone-acetaminophen (NORCO) 7.5-325 MG per tablet Take 1 tablet by mouth every 6 (six) hours as needed for pain.      [name] levothyroxine (SYNTHROID, LEVOTHROID) 50 MCG tablet Take 1 tablet by mouth daily.      [name] losartan (COZAAR) 50 MG tablet Take 1 tablet (50 mg total) by mouth daily.  30 tablet  11  . mometasone-formoterol (DULERA) 100-5 MCG/ACT AERO Inhale 2 puffs into the lungs 2 (two) times daily.  1 Inhaler  0  . NON FORMULARY daily. lacithin granules 2 TBSP daily.      [name] zolpidem (AMBIEN) 10 MG tablet Take 10 mg by mouth at bedtime as needed for sleep.       No current facility-administered medications for this visit.    History   Social History  . Marital Status: Married    Spouse Name: N/A    Number of Children: N/A  . Years of Education: N/A   Occupational History  . Not on file.   Social History Main Topics  . Smoking status: Former Smoker -- 1.00 packs/day  for 54 years    Types: Cigarettes    Quit date: 09/05/2012  . Smokeless tobacco: Not on file  . Alcohol Use: No  . Drug Use: No  . Sexual Activity: Not on file   Other Topics Concern  . Not on file   Social History Narrative  . No narrative on file    Family History  Problem Relation Age of Onset  . Coronary artery disease Father 34  . 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 He is married has one child 4 grandchildren. He quit smoking in Brian 2014 after  54 years of tobacco use.  ROS is negative for fevers, chills or night sweats.  He denies skin rash. He denies visual changes. He denies change in hearing. He does take albuterol on an as-needed basis for wheezing. He denies chest pressure. He denies recurrent anginal type symptoms. He denies change in bowel or bladder habits. He is unaware of bleeding in his urine but in the past has had bleeding issues rectally. He has not been able to be active initially since his rotator cuff surgery. He does note improved range of motion in the left shoulder but also does note some twinges of pain which will followup with Dr. Maxie Better. He denies claudication. He denies tremor. He is unaware of overt diabetes. He denies cold or heat intolerance. Other copy is a 14 point system review is negative.   PE BP 154/84  Pulse 67  Ht 5\' 9"  (1.753 m)  Wt 209 lb 11.2 oz (95.119 kg)  BMI 30.95 kg/m2  General: Alert, oriented, no distress.  Skin: normal turgor, no rashes HEENT: Normocephalic, atraumatic. Pupils round and reactive; sclera anicteric;no lid lag.  Nose without nasal septal hypertrophy Mouth/Parynx benign; Mallinpatti scale 3 Neck: No JVD, no carotid briuts Lungs: Decreased breath sounds without audible wheezing or rales Chest wall: Nontender to palpation Heart: RRR, s1 s2 normal with a 2/6 systolic murmur at the left sternal  border. No diastolic murmur. Abdomen: soft, nontender; no hepatosplenomehaly, BS+; abdominal aorta nontender and not dilated by palpation. Pulses: Slightly decreased pulses at the dorsalis pedis and PT bilaterally. Back: No CVA tenderness Extremities: no clubbing cyanosis or edema, Homan's sign negative  Neurologic: grossly nonfocal; cranial nerves intact Psychologic: normal affect and mood.  ECG (independently read by me): Normal sinus rhythm at 67 beats per minute; normal intervals.  LABS:  BMET    Component Value Date/Time   NA 138 09/17/2012 0438   K 3.8 09/17/2012 0438   CL 100 09/17/2012 0438   CO2 28 09/17/2012 0438   GLUCOSE 91 09/17/2012 0438   BUN 15 09/17/2012 0438   CREATININE 1.07 09/17/2012 0438   CALCIUM 8.8 09/17/2012 0438   GFRNONAA 70* 09/17/2012 0438   GFRAA 82* 09/17/2012 0438     Hepatic Function Panel     Component Value Date/Time   PROT 6.4 09/05/2012 1210   ALBUMIN 3.2* 09/05/2012 1210   AST 38* 09/05/2012 1210   ALT 56* 09/05/2012 1210   ALKPHOS 94 09/05/2012 1210   BILITOT 0.5 09/05/2012 1210     CBC    Component Value Date/Time   WBC 6.6 09/17/2012 0438   RBC 3.54* 09/17/2012 0438   HGB 10.2* 09/17/2012 0438   HCT 29.6* 09/17/2012 0438   PLT 197 09/17/2012 0438   MCV 83.6 09/17/2012 0438   MCH 28.8 09/17/2012 0438   MCHC 34.5 09/17/2012 0438   RDW 14.1 09/17/2012 0438   LYMPHSABS 2.9 09/05/2012 1210   MONOABS 1.0 09/05/2012 1210   EOSABS 0.1 09/05/2012 1210   BASOSABS 0.0 09/05/2012 1210     BNP No results found for this basename: probnp    Lipid Panel     Component Value Date/Time   CHOL 155 09/06/2012 0455   TRIG 179* 09/06/2012 0455   HDL 52 09/06/2012 0455   CHOLHDL 3.0 09/06/2012 0455   VLDL 36 09/06/2012 0455   LDLCALC 67 09/06/2012 0455     RADIOLOGY: No results found.    ASSESSMENT AND PLAN: Mr. Antony Haste has established coronary as well as peripheral vascular  disease. Due to progressive CAD he underwent CABG surgery in Brian 2014 by Dr.  Roxan Hockey. Previously he had undergone prior stenting to his proximal LAD, circumflex coronary artery as well as right coronary artery. He is now doing well following his bypass surgery. Blood pressure however continues to be elevated. When I last saw him, I recommended he resume losartan at 50 mg daily. Presently, blood pressure today continues still to be somewhat labile and I will further titrate this to 100 mg. I reviewed his laboratory in detail with him. He has been on atorvastatin 40 mg which I placed him on in place of his previous dose of simvastatin 80 mg which he was on by Dr. Rollene Fare. He still has significant increased LDL particle number and increased LDL small particles. We discussed the possibility of adding Niaspan versus adding Zetia. At present, I will add Zetia 10 mg in an attempt to reduce his LDL particle number ideally less than 1000 and preferably less than 700. I discussed metabolic syndrome and hyperinsulinemia as well as insulin resistance and potential need for weight loss and increased exercise to avoid overt diabetes. I will see him in 6 proper evaluation   Troy Sine, MD, [facility]  05/29/2013 4:07 PM

## 2013-05-29 NOTE — Patient Instructions (Signed)
Your physician has recommended you make the following change in your medication: restart the zetia. Increase the losartan from 50 mg to 100 mg daily.  Your physician recommends that you schedule a follow-up appointment in: 6 MONTHS.

## 2013-05-30 DIAGNOSIS — M9981 Other biomechanical lesions of cervical region: Secondary | ICD-10-CM | POA: Diagnosis not present

## 2013-05-30 DIAGNOSIS — M999 Biomechanical lesion, unspecified: Secondary | ICD-10-CM | POA: Diagnosis not present

## 2013-05-30 DIAGNOSIS — M47812 Spondylosis without myelopathy or radiculopathy, cervical region: Secondary | ICD-10-CM | POA: Diagnosis not present

## 2013-06-01 DIAGNOSIS — M999 Biomechanical lesion, unspecified: Secondary | ICD-10-CM | POA: Diagnosis not present

## 2013-06-01 DIAGNOSIS — M47812 Spondylosis without myelopathy or radiculopathy, cervical region: Secondary | ICD-10-CM | POA: Diagnosis not present

## 2013-06-01 DIAGNOSIS — M9981 Other biomechanical lesions of cervical region: Secondary | ICD-10-CM | POA: Diagnosis not present

## 2013-06-02 DIAGNOSIS — M6281 Muscle weakness (generalized): Secondary | ICD-10-CM | POA: Diagnosis not present

## 2013-06-02 DIAGNOSIS — M25519 Pain in unspecified shoulder: Secondary | ICD-10-CM | POA: Diagnosis not present

## 2013-06-02 DIAGNOSIS — M75120 Complete rotator cuff tear or rupture of unspecified shoulder, not specified as traumatic: Secondary | ICD-10-CM | POA: Diagnosis not present

## 2013-06-05 DIAGNOSIS — M6281 Muscle weakness (generalized): Secondary | ICD-10-CM | POA: Diagnosis not present

## 2013-06-05 DIAGNOSIS — M75120 Complete rotator cuff tear or rupture of unspecified shoulder, not specified as traumatic: Secondary | ICD-10-CM | POA: Diagnosis not present

## 2013-06-05 DIAGNOSIS — M25519 Pain in unspecified shoulder: Secondary | ICD-10-CM | POA: Diagnosis not present

## 2013-06-06 DIAGNOSIS — M999 Biomechanical lesion, unspecified: Secondary | ICD-10-CM | POA: Diagnosis not present

## 2013-06-06 DIAGNOSIS — M9981 Other biomechanical lesions of cervical region: Secondary | ICD-10-CM | POA: Diagnosis not present

## 2013-06-06 DIAGNOSIS — M47812 Spondylosis without myelopathy or radiculopathy, cervical region: Secondary | ICD-10-CM | POA: Diagnosis not present

## 2013-06-08 DIAGNOSIS — M25519 Pain in unspecified shoulder: Secondary | ICD-10-CM | POA: Diagnosis not present

## 2013-06-08 DIAGNOSIS — M6281 Muscle weakness (generalized): Secondary | ICD-10-CM | POA: Diagnosis not present

## 2013-06-08 DIAGNOSIS — M75120 Complete rotator cuff tear or rupture of unspecified shoulder, not specified as traumatic: Secondary | ICD-10-CM | POA: Diagnosis not present

## 2013-06-12 DIAGNOSIS — M6281 Muscle weakness (generalized): Secondary | ICD-10-CM | POA: Diagnosis not present

## 2013-06-12 DIAGNOSIS — M25519 Pain in unspecified shoulder: Secondary | ICD-10-CM | POA: Diagnosis not present

## 2013-06-12 DIAGNOSIS — M75120 Complete rotator cuff tear or rupture of unspecified shoulder, not specified as traumatic: Secondary | ICD-10-CM | POA: Diagnosis not present

## 2013-06-13 DIAGNOSIS — M999 Biomechanical lesion, unspecified: Secondary | ICD-10-CM | POA: Diagnosis not present

## 2013-06-13 DIAGNOSIS — J218 Acute bronchiolitis due to other specified organisms: Secondary | ICD-10-CM | POA: Diagnosis not present

## 2013-06-13 DIAGNOSIS — J018 Other acute sinusitis: Secondary | ICD-10-CM | POA: Diagnosis not present

## 2013-06-13 DIAGNOSIS — M47812 Spondylosis without myelopathy or radiculopathy, cervical region: Secondary | ICD-10-CM | POA: Diagnosis not present

## 2013-06-13 DIAGNOSIS — E039 Hypothyroidism, unspecified: Secondary | ICD-10-CM | POA: Diagnosis not present

## 2013-06-13 DIAGNOSIS — M9981 Other biomechanical lesions of cervical region: Secondary | ICD-10-CM | POA: Diagnosis not present

## 2013-06-13 DIAGNOSIS — J209 Acute bronchitis, unspecified: Secondary | ICD-10-CM | POA: Diagnosis not present

## 2013-06-15 DIAGNOSIS — M75120 Complete rotator cuff tear or rupture of unspecified shoulder, not specified as traumatic: Secondary | ICD-10-CM | POA: Diagnosis not present

## 2013-06-15 DIAGNOSIS — M25519 Pain in unspecified shoulder: Secondary | ICD-10-CM | POA: Diagnosis not present

## 2013-06-15 DIAGNOSIS — M6281 Muscle weakness (generalized): Secondary | ICD-10-CM | POA: Diagnosis not present

## 2013-06-19 DIAGNOSIS — M75120 Complete rotator cuff tear or rupture of unspecified shoulder, not specified as traumatic: Secondary | ICD-10-CM | POA: Diagnosis not present

## 2013-06-19 DIAGNOSIS — M6281 Muscle weakness (generalized): Secondary | ICD-10-CM | POA: Diagnosis not present

## 2013-06-19 DIAGNOSIS — M25519 Pain in unspecified shoulder: Secondary | ICD-10-CM | POA: Diagnosis not present

## 2013-06-20 DIAGNOSIS — M47812 Spondylosis without myelopathy or radiculopathy, cervical region: Secondary | ICD-10-CM | POA: Diagnosis not present

## 2013-06-20 DIAGNOSIS — M999 Biomechanical lesion, unspecified: Secondary | ICD-10-CM | POA: Diagnosis not present

## 2013-06-20 DIAGNOSIS — N419 Inflammatory disease of prostate, unspecified: Secondary | ICD-10-CM | POA: Diagnosis not present

## 2013-06-20 DIAGNOSIS — M9981 Other biomechanical lesions of cervical region: Secondary | ICD-10-CM | POA: Diagnosis not present

## 2013-07-07 DIAGNOSIS — R059 Cough, unspecified: Secondary | ICD-10-CM | POA: Diagnosis not present

## 2013-07-07 DIAGNOSIS — J40 Bronchitis, not specified as acute or chronic: Secondary | ICD-10-CM | POA: Diagnosis not present

## 2013-07-07 DIAGNOSIS — R05 Cough: Secondary | ICD-10-CM | POA: Diagnosis not present

## 2013-07-07 DIAGNOSIS — J218 Acute bronchiolitis due to other specified organisms: Secondary | ICD-10-CM | POA: Diagnosis not present

## 2013-07-07 DIAGNOSIS — J209 Acute bronchitis, unspecified: Secondary | ICD-10-CM | POA: Diagnosis not present

## 2013-07-10 DIAGNOSIS — M25819 Other specified joint disorders, unspecified shoulder: Secondary | ICD-10-CM | POA: Diagnosis not present

## 2013-07-10 DIAGNOSIS — M75120 Complete rotator cuff tear or rupture of unspecified shoulder, not specified as traumatic: Secondary | ICD-10-CM | POA: Diagnosis not present

## 2013-07-10 DIAGNOSIS — M25519 Pain in unspecified shoulder: Secondary | ICD-10-CM | POA: Diagnosis not present

## 2013-07-10 DIAGNOSIS — M6281 Muscle weakness (generalized): Secondary | ICD-10-CM | POA: Diagnosis not present

## 2013-07-10 DIAGNOSIS — Z9889 Other specified postprocedural states: Secondary | ICD-10-CM | POA: Diagnosis not present

## 2013-07-18 DIAGNOSIS — M9981 Other biomechanical lesions of cervical region: Secondary | ICD-10-CM | POA: Diagnosis not present

## 2013-07-18 DIAGNOSIS — M75120 Complete rotator cuff tear or rupture of unspecified shoulder, not specified as traumatic: Secondary | ICD-10-CM | POA: Diagnosis not present

## 2013-07-18 DIAGNOSIS — M6281 Muscle weakness (generalized): Secondary | ICD-10-CM | POA: Diagnosis not present

## 2013-07-18 DIAGNOSIS — M47812 Spondylosis without myelopathy or radiculopathy, cervical region: Secondary | ICD-10-CM | POA: Diagnosis not present

## 2013-07-18 DIAGNOSIS — M999 Biomechanical lesion, unspecified: Secondary | ICD-10-CM | POA: Diagnosis not present

## 2013-07-18 DIAGNOSIS — M25519 Pain in unspecified shoulder: Secondary | ICD-10-CM | POA: Diagnosis not present

## 2013-07-20 DIAGNOSIS — M6281 Muscle weakness (generalized): Secondary | ICD-10-CM | POA: Diagnosis not present

## 2013-07-20 DIAGNOSIS — M75120 Complete rotator cuff tear or rupture of unspecified shoulder, not specified as traumatic: Secondary | ICD-10-CM | POA: Diagnosis not present

## 2013-07-20 DIAGNOSIS — M25519 Pain in unspecified shoulder: Secondary | ICD-10-CM | POA: Diagnosis not present

## 2013-07-21 DIAGNOSIS — N138 Other obstructive and reflux uropathy: Secondary | ICD-10-CM | POA: Diagnosis not present

## 2013-07-21 DIAGNOSIS — J441 Chronic obstructive pulmonary disease with (acute) exacerbation: Secondary | ICD-10-CM | POA: Diagnosis not present

## 2013-07-21 DIAGNOSIS — N401 Enlarged prostate with lower urinary tract symptoms: Secondary | ICD-10-CM | POA: Diagnosis not present

## 2013-07-21 DIAGNOSIS — N139 Obstructive and reflux uropathy, unspecified: Secondary | ICD-10-CM | POA: Diagnosis not present

## 2013-07-24 DIAGNOSIS — M6281 Muscle weakness (generalized): Secondary | ICD-10-CM | POA: Diagnosis not present

## 2013-07-24 DIAGNOSIS — M25519 Pain in unspecified shoulder: Secondary | ICD-10-CM | POA: Diagnosis not present

## 2013-07-24 DIAGNOSIS — M75120 Complete rotator cuff tear or rupture of unspecified shoulder, not specified as traumatic: Secondary | ICD-10-CM | POA: Diagnosis not present

## 2013-07-27 DIAGNOSIS — M6281 Muscle weakness (generalized): Secondary | ICD-10-CM | POA: Diagnosis not present

## 2013-07-27 DIAGNOSIS — M75120 Complete rotator cuff tear or rupture of unspecified shoulder, not specified as traumatic: Secondary | ICD-10-CM | POA: Diagnosis not present

## 2013-07-27 DIAGNOSIS — M25519 Pain in unspecified shoulder: Secondary | ICD-10-CM | POA: Diagnosis not present

## 2013-07-31 DIAGNOSIS — M75120 Complete rotator cuff tear or rupture of unspecified shoulder, not specified as traumatic: Secondary | ICD-10-CM | POA: Diagnosis not present

## 2013-07-31 DIAGNOSIS — M6281 Muscle weakness (generalized): Secondary | ICD-10-CM | POA: Diagnosis not present

## 2013-07-31 DIAGNOSIS — M25519 Pain in unspecified shoulder: Secondary | ICD-10-CM | POA: Diagnosis not present

## 2013-08-03 DIAGNOSIS — M25519 Pain in unspecified shoulder: Secondary | ICD-10-CM | POA: Diagnosis not present

## 2013-08-03 DIAGNOSIS — M6281 Muscle weakness (generalized): Secondary | ICD-10-CM | POA: Diagnosis not present

## 2013-08-03 DIAGNOSIS — M75120 Complete rotator cuff tear or rupture of unspecified shoulder, not specified as traumatic: Secondary | ICD-10-CM | POA: Diagnosis not present

## 2013-08-07 DIAGNOSIS — M6281 Muscle weakness (generalized): Secondary | ICD-10-CM | POA: Diagnosis not present

## 2013-08-07 DIAGNOSIS — M75120 Complete rotator cuff tear or rupture of unspecified shoulder, not specified as traumatic: Secondary | ICD-10-CM | POA: Diagnosis not present

## 2013-08-07 DIAGNOSIS — M25519 Pain in unspecified shoulder: Secondary | ICD-10-CM | POA: Diagnosis not present

## 2013-08-10 DIAGNOSIS — M75120 Complete rotator cuff tear or rupture of unspecified shoulder, not specified as traumatic: Secondary | ICD-10-CM | POA: Diagnosis not present

## 2013-08-10 DIAGNOSIS — M25519 Pain in unspecified shoulder: Secondary | ICD-10-CM | POA: Diagnosis not present

## 2013-08-10 DIAGNOSIS — M6281 Muscle weakness (generalized): Secondary | ICD-10-CM | POA: Diagnosis not present

## 2013-08-14 DIAGNOSIS — M6281 Muscle weakness (generalized): Secondary | ICD-10-CM | POA: Diagnosis not present

## 2013-08-14 DIAGNOSIS — M25519 Pain in unspecified shoulder: Secondary | ICD-10-CM | POA: Diagnosis not present

## 2013-08-14 DIAGNOSIS — M75120 Complete rotator cuff tear or rupture of unspecified shoulder, not specified as traumatic: Secondary | ICD-10-CM | POA: Diagnosis not present

## 2013-08-15 DIAGNOSIS — M999 Biomechanical lesion, unspecified: Secondary | ICD-10-CM | POA: Diagnosis not present

## 2013-08-15 DIAGNOSIS — M9981 Other biomechanical lesions of cervical region: Secondary | ICD-10-CM | POA: Diagnosis not present

## 2013-08-15 DIAGNOSIS — M47812 Spondylosis without myelopathy or radiculopathy, cervical region: Secondary | ICD-10-CM | POA: Diagnosis not present

## 2013-08-17 DIAGNOSIS — M47812 Spondylosis without myelopathy or radiculopathy, cervical region: Secondary | ICD-10-CM | POA: Diagnosis not present

## 2013-08-17 DIAGNOSIS — M75120 Complete rotator cuff tear or rupture of unspecified shoulder, not specified as traumatic: Secondary | ICD-10-CM | POA: Diagnosis not present

## 2013-08-17 DIAGNOSIS — M6281 Muscle weakness (generalized): Secondary | ICD-10-CM | POA: Diagnosis not present

## 2013-08-17 DIAGNOSIS — M999 Biomechanical lesion, unspecified: Secondary | ICD-10-CM | POA: Diagnosis not present

## 2013-08-17 DIAGNOSIS — M9981 Other biomechanical lesions of cervical region: Secondary | ICD-10-CM | POA: Diagnosis not present

## 2013-08-17 DIAGNOSIS — M25519 Pain in unspecified shoulder: Secondary | ICD-10-CM | POA: Diagnosis not present

## 2013-08-21 DIAGNOSIS — M5030 Other cervical disc degeneration, unspecified cervical region: Secondary | ICD-10-CM | POA: Diagnosis not present

## 2013-08-21 DIAGNOSIS — M5020 Other cervical disc displacement, unspecified cervical region: Secondary | ICD-10-CM | POA: Diagnosis not present

## 2013-08-21 DIAGNOSIS — Z9889 Other specified postprocedural states: Secondary | ICD-10-CM | POA: Diagnosis not present

## 2013-08-24 ENCOUNTER — Ambulatory Visit (INDEPENDENT_AMBULATORY_CARE_PROVIDER_SITE_OTHER): Payer: Medicare Other | Admitting: Internal Medicine

## 2013-08-24 ENCOUNTER — Encounter: Payer: Self-pay | Admitting: Internal Medicine

## 2013-08-24 VITALS — BP 124/64 | HR 56 | Ht 69.0 in | Wt 201.6 lb

## 2013-08-24 DIAGNOSIS — I2510 Atherosclerotic heart disease of native coronary artery without angina pectoris: Secondary | ICD-10-CM | POA: Diagnosis not present

## 2013-08-24 DIAGNOSIS — J42 Unspecified chronic bronchitis: Secondary | ICD-10-CM

## 2013-08-24 DIAGNOSIS — F17200 Nicotine dependence, unspecified, uncomplicated: Secondary | ICD-10-CM | POA: Diagnosis not present

## 2013-08-24 MED ORDER — THEOPHYLLINE ER 200 MG PO TB12: ORAL_TABLET | ORAL | Status: DC

## 2013-08-24 MED ORDER — FLUTICASONE FUROATE-VILANTEROL 100-25 MCG/INH IN AEPB: INHALATION_SPRAY | RESPIRATORY_TRACT | Status: DC

## 2013-08-24 NOTE — Progress Notes (Signed)
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 Persistent dry cough started 2 or 3 months after 2V CABG April, 2014. Primary physician gave prednisone for arthralgias, and amoxicillin. Tried a pro air inhaler in June for mild wheeze. Dropped off of Dulera inhaler which made his throat sore. Occasional brown phlegm. Denies blood, fever or shortness of breath with exertion Quit smoking April, 2014. Previous diagnosis of sleep apnea. PFT 06/26/09- WNL FEV1/FVC 0.75. CXR 10/11/12 IMPRESSION:  Improved effusions as described. No other focal abnormality is  noted.  Original Report Authenticated By: Inez Catalina, M.D.  02/22/13- 53 yoM former smoker (27 pk yrs) Former Patient- re-established for asthma/ bronchitis complicated by CAD/ CABG, GERD FOLLOWS FOR:  Still having some chest congestion. Had flu vax. Just off prednisone for shoulder. Still min congestion chest. Scant clear phlegm. Took Dulera 200, the 100. Both made him very hoarse despite rinse, so he quit. Used sample Spiriva.  08/24/13- 67 yoM former smoker (45 pk yrs) Former Patient- re-established for asthma/ bronchitis complicated by CAD/ CABG, GERD FOLLOWS FOR:c/o chest congestion since Jan.-finished 2 abx since then, pred. taper on 5mg  now,cough-yellow,no fcs,mild sob with exertion,occass. wheezing Dulera makes him hoarse. Minimal DOE.  Says CXR "ok" in January at [place].  ROS-see HPI Constitutional:   No-   weight loss, night sweats, fevers, chills, fatigue, lassitude. HEENT:   No-  headaches, difficulty swallowing, tooth/dental problems, sore throat,       No-  sneezing, itching, ear ache, nasal congestion, post nasal drip,  CV:  No-   chest pain, orthopnea, PND,  swelling in lower extremities, anasarca, dizziness, palpitations Resp: +shortness of breath with exertion or at rest.              No-   productive cough,  + non-productive cough,  No- coughing up of blood.              No-   change in color of mucus.  + wheezing.   Skin: No-   rash or lesions. GI:  No-   heartburn, indigestion, abdominal pain, nausea, vomiting,  GU:  MS:  No-   joint pain or swelling.  . Neuro-     nothing unusual Psych:  No- change in mood or affect. No depression or anxiety.  No memory loss.  OBJ- Physical Exam General- Alert, Oriented, Affect-appropriate, Distress- none acute Skin- rash-none, lesions- none, excoriation- none Lymphadenopathy- none Head- atraumatic            Eyes- Gross vision intact, PERRLA, conjunctivae and secretions clear            Ears- Hearing, canals-normal            Nose- Clear, no-Septal dev, mucus, polyps, erosion, perforation             Throat- Mallampati II , mucosa clear , drainage- none, tonsils- atrophic, +mild                     hoarseness, dentures Neck- flexible , trachea midline, no stridor , thyroid nl, carotid no bruit Chest - symmetrical excursion , unlabored           Heart/CV- RRR , no murmur , no gallop  , no rub,  nl s1 s2                           - JVD- none , edema- none, stasis changes- none, varices- none           Lung- + basilar wheeze, wheeze- none, cough- none , dullness-none, rub- none           Chest wall-  Abd-  Br/ Gen/ Rectal- Not done, not indicated Extrem- cyanosis- none, clubbing, none, atrophy- none, strength- nl Neuro- grossly intact to observation

## 2013-08-24 NOTE — Patient Instructions (Addendum)
Sample Breo Ellipta inhaler   1 puff then rinse mouth one time each day      Try this instead of Dulera  You can still use the albuterol rescue inhaler if needed  Script to try Theophylline for your breathing  We will call [facility] for your Xray report from January for our record

## 2013-08-31 DIAGNOSIS — M47812 Spondylosis without myelopathy or radiculopathy, cervical region: Secondary | ICD-10-CM | POA: Diagnosis not present

## 2013-08-31 DIAGNOSIS — M999 Biomechanical lesion, unspecified: Secondary | ICD-10-CM | POA: Diagnosis not present

## 2013-08-31 DIAGNOSIS — M9981 Other biomechanical lesions of cervical region: Secondary | ICD-10-CM | POA: Diagnosis not present

## 2013-09-07 DIAGNOSIS — M47812 Spondylosis without myelopathy or radiculopathy, cervical region: Secondary | ICD-10-CM | POA: Diagnosis not present

## 2013-09-07 DIAGNOSIS — M9981 Other biomechanical lesions of cervical region: Secondary | ICD-10-CM | POA: Diagnosis not present

## 2013-09-07 DIAGNOSIS — M999 Biomechanical lesion, unspecified: Secondary | ICD-10-CM | POA: Diagnosis not present

## 2013-09-07 NOTE — Telephone Encounter (Signed)
Encounter Closed on 09/07/13 by TP 

## 2013-09-13 DIAGNOSIS — M9981 Other biomechanical lesions of cervical region: Secondary | ICD-10-CM | POA: Diagnosis not present

## 2013-09-13 DIAGNOSIS — M47812 Spondylosis without myelopathy or radiculopathy, cervical region: Secondary | ICD-10-CM | POA: Diagnosis not present

## 2013-09-13 DIAGNOSIS — M999 Biomechanical lesion, unspecified: Secondary | ICD-10-CM | POA: Diagnosis not present

## 2013-09-24 NOTE — Assessment & Plan Note (Signed)
Exacerbation since January. Plan-try sample Breo instead of Dulera, Theophylline

## 2013-09-24 NOTE — Assessment & Plan Note (Signed)
Has remained off of cigarettes

## 2013-11-01 DIAGNOSIS — E119 Type 2 diabetes mellitus without complications: Secondary | ICD-10-CM | POA: Diagnosis not present

## 2013-11-01 DIAGNOSIS — F17200 Nicotine dependence, unspecified, uncomplicated: Secondary | ICD-10-CM | POA: Diagnosis not present

## 2013-11-01 DIAGNOSIS — E782 Mixed hyperlipidemia: Secondary | ICD-10-CM | POA: Diagnosis not present

## 2013-11-01 DIAGNOSIS — J029 Acute pharyngitis, unspecified: Secondary | ICD-10-CM | POA: Diagnosis not present

## 2013-11-30 DIAGNOSIS — M9981 Other biomechanical lesions of cervical region: Secondary | ICD-10-CM | POA: Diagnosis not present

## 2013-11-30 DIAGNOSIS — M47812 Spondylosis without myelopathy or radiculopathy, cervical region: Secondary | ICD-10-CM | POA: Diagnosis not present

## 2013-11-30 DIAGNOSIS — M999 Biomechanical lesion, unspecified: Secondary | ICD-10-CM | POA: Diagnosis not present

## 2014-02-08 DIAGNOSIS — S058X9A Other injuries of unspecified eye and orbit, initial encounter: Secondary | ICD-10-CM | POA: Diagnosis not present

## 2014-02-14 ENCOUNTER — Ambulatory Visit (INDEPENDENT_AMBULATORY_CARE_PROVIDER_SITE_OTHER): Payer: Medicare Other | Admitting: Cardiovascular Disease

## 2014-02-14 ENCOUNTER — Encounter: Payer: Self-pay | Admitting: Cardiovascular Disease

## 2014-02-14 VITALS — BP 118/76 | HR 66 | Ht 69.0 in | Wt 186.6 lb

## 2014-02-14 DIAGNOSIS — I4891 Unspecified atrial fibrillation: Secondary | ICD-10-CM | POA: Diagnosis not present

## 2014-02-14 DIAGNOSIS — E8881 Metabolic syndrome: Secondary | ICD-10-CM

## 2014-02-14 DIAGNOSIS — I739 Peripheral vascular disease, unspecified: Secondary | ICD-10-CM

## 2014-02-14 DIAGNOSIS — E782 Mixed hyperlipidemia: Secondary | ICD-10-CM

## 2014-02-14 DIAGNOSIS — I2510 Atherosclerotic heart disease of native coronary artery without angina pectoris: Secondary | ICD-10-CM | POA: Diagnosis not present

## 2014-02-14 DIAGNOSIS — I480 Paroxysmal atrial fibrillation: Secondary | ICD-10-CM

## 2014-02-14 DIAGNOSIS — R002 Palpitations: Secondary | ICD-10-CM

## 2014-02-14 DIAGNOSIS — E785 Hyperlipidemia, unspecified: Secondary | ICD-10-CM

## 2014-02-14 DIAGNOSIS — I10 Essential (primary) hypertension: Secondary | ICD-10-CM | POA: Diagnosis not present

## 2014-02-14 NOTE — Patient Instructions (Signed)
Your physician recommends that you return for lab work fasting.  Your physician wants you to follow-up in: 6 months or sooner if needed. You will receive a reminder letter in the mail two months in advance. If you don't receive a letter, please call our office to schedule the follow-up appointment. No changes were made today in your therapy.

## 2014-02-14 NOTE — Progress Notes (Signed)
Patient ID: Brian Brock, male   DOB: July 20, 1945, 68 y.o.   MRN: [id number]     HPI: Brian Brock is a 68 y.o. male who is a former patient of Dr. Terance Ice. He had established cardiology care with me in October 2014 prior to undergoing rotator cuff surgery by Dr. Ardyth Gal.   Brian Brock has known coronary artery disease and also has a history of hypertension, hyperlipidemia, hypothyroidism, peripheral vascular disease and probable metabolic syndrome. He has undergone prior percutaneous coronary interventions with stenting to his LAD and right coronary artery in 1997, 1998, and in 2004 to his proximal left circumflex coronary artery. He also has established peripheral vascular disease and underwent stenting to his left SFA in 2004 and right SFA in 2008. There is a history of tobacco use, hyperlipidemia in April 2004 he presented to [facility] with chest pain and T wave abnormalities. He was seen by Dr. Gwenlyn Found at that time and a nuclear perfusion study demonstrated reversible ischemia in the apical segment of the anterior wall. He underwent repeat cardiac catheterization on 09/07/2012 by me and was found to have preserved LV function with minimal focal region of mild inferior hypocontractility. He had significant progression of CAD with ostial 90% eccentric stenosis of the LAD extending into the distal left main proximal to a previously placed proximal LAD stent. There also is 90% stenosis of the LAD after the stented segment. The circumflex vessel is 60% ostial stenosis followed by 60% proximal stenosis prior to the previously placed stent. The RCA stent was patent.  CABG surgery was recommended and this was done by Dr. Roxan Hockey on 09/12/2012 with an off-pump LIMA to LAD and SVG to circumflex marginal 1 vessel.  When I saw Brian Brock, I gave him clearance to undergo rotator cuff surgery. He  tolerated this well.  He denies recent anginal symptoms. He does have a history of  peripheral vascular disease and is status post right common femoral and proximal SFA stenting with left SFA stenting. He underwent a one-year followup [place] a duplex study on 04/18/2013. ABIs were excellent bilaterally at 1.1. It is less than 49% diameter reduction noted in the right common iliac, right common femoral and SFA stent, and was less than 50% diameter reduction in the right distal SFA. There was 50-69% diameter reduction in the left external iliac artery and less than 49% and left common femoral. The left SFA stent was widely patent.  Lab work by Dr. Unk Lightning showed  Hemoglobin 14.5 hematocrit 43.43, serum glucose was 105. An NMR profile revealed a significant increase LDL particle #[id number] due to predominant increase in small LDL particles at 1304. He did have significant increased insulin resistance cord 75 concordant with an atherogenic dyslipidemia pattern and metabolic syndrome.  Approximately 2 weeks ago, after having Pepsi.  He had noticed some palpitations.  This seemed to resolve.  He denies any recent episodes of chest pressure.  He denies PND or orthopnea.  He denies presyncope or syncope.  He presents today for cardiology reevaluation.  Past Medical History  Diagnosis Date  . Coronary artery disease     Multiple stents  . Myocardial infarct   . Asthma   . COPD (chronic obstructive pulmonary disease)   . Peripheral vascular disease     prior stenting  . Dyslipidemia   . Hyperlipemia   . Sleep apnea   . Allergic rhinitis     Past Surgical History  Procedure Laterality Date  . Coronary stent  placement  '97, '98, '04    seven cardiac stents  . Peripheral vascular dis Bilateral 7/06  8/08    Lt SFA '06, Rt SFA 8/08  . Cholecystectomy  June 2010  . Coronary artery bypass graft N/A 09/12/2012    Procedure: OFF PUMP CORONARY ARTERY BYPASS GRAFTING (CABG);  Surgeon: Melrose Nakayama, MD;  Location: [facility];  Service: Open Heart Surgery;  Laterality: N/A;  Times two using  left internal mammary artery and endoscopically harvested right saphenous vein    Allergies  Allergen Reactions  . Metoprolol Other (See Comments)    Fatigue and malaise  . Amoxicillin-Pot Clavulanate     Unknown reaction - possibly rash or increased heart (patient cannot remember)   . Levofloxacin     Unknown reaction  . Meloxicam     Unknown reaction  . Metaxalone     Unknown reaction  . Sulfonamide Derivatives     unknown reaction - bad rash    Current Outpatient Prescriptions  Medication Sig Dispense Refill  . aspirin EC 81 MG tablet Take 81 mg by mouth daily.      [name] atorvastatin (LIPITOR) 40 MG tablet Take 1 tablet (40 mg total) by mouth daily.  30 tablet  11  . cetirizine (ZYRTEC) 10 MG tablet Take 10 mg by mouth daily.      . cholestyramine (QUESTRAN) 4 G packet Take 1 packet by mouth 2 (two) times daily with a meal.  60 each  11  . ezetimibe (ZETIA) 10 MG tablet Take 1 tablet (10 mg total) by mouth daily.  3090 tablet  3  . levothyroxine (SYNTHROID, LEVOTHROID) 50 MCG tablet Take 1 tablet by mouth daily.      [name] losartan (COZAAR) 100 MG tablet Take 1 tablet (100 mg total) by mouth daily.  90 tablet  3  . NON FORMULARY daily. lacithin granules 2 TBSP daily.       No current facility-administered medications for this visit.    History   Social History  . Marital Status: Married    Spouse Name: N/A    Number of Children: N/A  . Years of Education: N/A   Occupational History  . Not on file.   Social History Main Topics  . Smoking status: Former Smoker -- 0.50 packs/day for 54 years    Types: Cigarettes  . Smokeless tobacco: Not on file  . Alcohol Use: No  . Drug Use: No  . Sexual Activity: Not on file   Other Topics Concern  . Not on file   Social History Narrative  . No narrative on file    Family History  Problem Relation Age of Onset  . Coronary artery disease Father 56  . 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 He is married has one child 4 grandchildren. He quit smoking in April 2014 after 54 years of tobacco use.  ROS General: Negative; No fevers, chills, or night sweats;  HEENT: Negative; No changes in vision or hearing, sinus congestion, difficulty swallowing Pulmonary: Negative; No cough, wheezing, shortness of breath, hemoptysis Cardiovascular: Negative; No chest pain, presyncope, syncope, palpitations GI: Negative; No nausea, vomiting, diarrhea, or abdominal pain GU: Negative; No dysuria, hematuria, or difficulty voiding Musculoskeletal: Improved range of motion in the left shoulder, but also notes intermittent twinges of pain; no myalgias, or weakness Hematologic/Oncology: Negative; no  easy bruising, bleeding Endocrine: Negative; no heat/cold intolerance; no diabetes Neuro: Negative; no changes in balance, headaches Skin: Negative; No rashes or skin lesions Psychiatric: Negative; No behavioral problems, depression Sleep: Negative; No snoring, daytime sleepiness, hypersomnolence, bruxism, restless legs, hypnogognic hallucinations, no cataplexy Other comprehensive 14 point system review is negative.  PE BP 118/76  Pulse 66  Ht 5\' 9"  (1.753 m)  Wt 186 lb 9.6 oz (84.641 kg)  BMI 27.54 kg/m2  General: Alert, oriented, no distress.  Skin: normal turgor, no rashes HEENT: Normocephalic, atraumatic. Pupils round and reactive; sclera anicteric;no lid lag.  Nose without nasal septal hypertrophy Mouth/Parynx benign; Mallinpatti scale 3 Neck: No JVD, no carotid briuts Lungs: Decreased breath sounds without audible wheezing or rales Chest wall: Nontender to palpation Heart: RRR, s1 s2 normal with a 2/6 systolic murmur at the left sternal border. No diastolic murmur. Abdomen: soft, nontender; no hepatosplenomehaly, BS+; abdominal aorta nontender and not dilated by palpation. Pulses: Slightly decreased pulses  at the dorsalis pedis and PT bilaterally. Back: No CVA tenderness Extremities: no clubbing cyanosis or edema, Homan's sign negative  Neurologic: grossly nonfocal; cranial nerves intact Psychologic: normal affect and mood.  ECG (independently read by me): Normal sinus rhythm at 66 beats per minute.  No ectopy.  PR interval 150 ms.  QTc interval 421 ms  January 2015 ECG (independently read by me): Normal sinus rhythm at 67 beats per minute; normal intervals.  LABS:  BMET    Component Value Date/Time   NA 138 09/17/2012 0438   K 3.8 09/17/2012 0438   CL 100 09/17/2012 0438   CO2 28 09/17/2012 0438   GLUCOSE 91 09/17/2012 0438   BUN 15 09/17/2012 0438   CREATININE 1.07 09/17/2012 0438   CALCIUM 8.8 09/17/2012 0438   GFRNONAA 70* 09/17/2012 0438   GFRAA 82* 09/17/2012 0438     Hepatic Function Panel     Component Value Date/Time   PROT 6.4 09/05/2012 1210   ALBUMIN 3.2* 09/05/2012 1210   AST 38* 09/05/2012 1210   ALT 56* 09/05/2012 1210   ALKPHOS 94 09/05/2012 1210   BILITOT 0.5 09/05/2012 1210     CBC    Component Value Date/Time   WBC 6.6 09/17/2012 0438   RBC 3.54* 09/17/2012 0438   HGB 10.2* 09/17/2012 0438   HCT 29.6* 09/17/2012 0438   PLT 197 09/17/2012 0438   MCV 83.6 09/17/2012 0438   MCH 28.8 09/17/2012 0438   MCHC 34.5 09/17/2012 0438   RDW 14.1 09/17/2012 0438   LYMPHSABS 2.9 09/05/2012 1210   MONOABS 1.0 09/05/2012 1210   EOSABS 0.1 09/05/2012 1210   BASOSABS 0.0 09/05/2012 1210     BNP No results found for this basename: probnp    Lipid Panel     Component Value Date/Time   CHOL 155 09/06/2012 0455   TRIG 179* 09/06/2012 0455   HDL 52 09/06/2012 0455   CHOLHDL 3.0 09/06/2012 0455   VLDL 36 09/06/2012 0455   LDLCALC 67 09/06/2012 0455     RADIOLOGY: No results found.    ASSESSMENT AND PLAN: Brian Brock has established coronary as well as peripheral vascular disease. Due to progressive CAD he underwent CABG surgery in April 2014 by Dr. Roxan Hockey. Previously he had  undergone prior stenting to his proximal LAD, circumflex coronary artery as well as right coronary artery. He is now doing well following his bypass surgery.  His blood pressure today is well controlled with the increase in losartan to 100 mg daily.  He now is on atorvastatin and Zetia for treatment of significant hyperlipidemia.  Remotely, I have taken him off high-dose simvastatin.  He is on levothyroxine for hypothyroidism.  He is on aspirin for antiplatelet therapy.  I suspect his recent palpitations for caffeine mediated secondary to his Pepsi soft drinks.  I am recommending followup laboratory consisting of a CBC, comprehensive metabolic panel, TSH, lipid panel and will also check a hemoglobin A1c.  I will see him in 6 months for cardiology reevaluation.   Troy Sine, MD, [facility]  02/14/2014 12:51 PM

## 2014-02-15 ENCOUNTER — Encounter: Payer: Self-pay | Admitting: Cardiovascular Disease

## 2014-02-15 DIAGNOSIS — R002 Palpitations: Secondary | ICD-10-CM | POA: Insufficient documentation

## 2014-02-15 DIAGNOSIS — E785 Hyperlipidemia, unspecified: Secondary | ICD-10-CM | POA: Insufficient documentation

## 2014-02-20 DIAGNOSIS — E039 Hypothyroidism, unspecified: Secondary | ICD-10-CM | POA: Diagnosis not present

## 2014-02-20 DIAGNOSIS — E119 Type 2 diabetes mellitus without complications: Secondary | ICD-10-CM | POA: Diagnosis not present

## 2014-02-20 DIAGNOSIS — E8881 Metabolic syndrome: Secondary | ICD-10-CM | POA: Diagnosis not present

## 2014-02-20 DIAGNOSIS — I10 Essential (primary) hypertension: Secondary | ICD-10-CM | POA: Diagnosis not present

## 2014-02-23 ENCOUNTER — Ambulatory Visit: Payer: Medicare Other | Admitting: Internal Medicine

## 2014-02-27 ENCOUNTER — Ambulatory Visit (INDEPENDENT_AMBULATORY_CARE_PROVIDER_SITE_OTHER): Payer: Medicare Other | Admitting: Internal Medicine

## 2014-02-27 ENCOUNTER — Encounter: Payer: Self-pay | Admitting: Internal Medicine

## 2014-02-27 VITALS — BP 120/70 | HR 72 | Ht 69.0 in | Wt 189.0 lb

## 2014-02-27 DIAGNOSIS — I480 Paroxysmal atrial fibrillation: Secondary | ICD-10-CM

## 2014-02-27 DIAGNOSIS — I2510 Atherosclerotic heart disease of native coronary artery without angina pectoris: Secondary | ICD-10-CM | POA: Diagnosis not present

## 2014-02-27 DIAGNOSIS — Z23 Encounter for immunization: Secondary | ICD-10-CM | POA: Diagnosis not present

## 2014-02-27 DIAGNOSIS — J441 Chronic obstructive pulmonary disease with (acute) exacerbation: Secondary | ICD-10-CM

## 2014-02-27 DIAGNOSIS — Z720 Tobacco use: Secondary | ICD-10-CM | POA: Diagnosis not present

## 2014-02-27 DIAGNOSIS — F17200 Nicotine dependence, unspecified, uncomplicated: Secondary | ICD-10-CM

## 2014-02-27 NOTE — Assessment & Plan Note (Signed)
No obvious infection or wheeze Plan- smoking cessation, CXR, flu vax, medication talk

## 2014-02-27 NOTE — Progress Notes (Signed)
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 Persistent dry cough started 2 or 3 months after 2V CABG April, 2014. Primary physician gave prednisone for arthralgias, and amoxicillin. Tried a pro air inhaler in June for mild wheeze. Dropped off of Dulera inhaler which made his throat sore. Occasional brown phlegm. Denies blood, fever or shortness of breath with exertion Quit smoking April, 2014. Previous diagnosis of sleep apnea. PFT 06/26/09- WNL FEV1/FVC 0.75. CXR 10/11/12 IMPRESSION:  Improved effusions as described. No other focal abnormality is  noted.  Original Report Authenticated By: Inez Catalina, M.D.  02/22/13- 36 yoM former smoker (37 pk yrs) Former Patient- re-established for asthma/ bronchitis complicated by CAD/ CABG, GERD FOLLOWS FOR:  Still having some chest congestion. Had flu vax. Just off prednisone for shoulder. Still min congestion chest. Scant clear phlegm. Took Dulera 200, the 100. Both made him very hoarse despite rinse, so he quit. Used sample Spiriva.  08/24/13- 67 yoM former smoker (12 pk yrs) Former Patient- re-established for asthma/ bronchitis complicated by CAD/ CABG, GERD FOLLOWS FOR:c/o chest congestion since Jan.-finished 2 abx since then, pred. taper on 5mg  now,cough-yellow,no fcs,mild sob with exertion,occass. wheezing Dulera makes him hoarse. Minimal DOE.  Says CXR "ok" in January at [place].  02/27/14- 68 yoM smoker (54 pk yrs) Former Patient- re-established for asthma/ bronchitis complicated by CAD/ CABG, GERD FOLLOWS FOR:  breathing is doing ok.  congestion at times from smoking He has been smoking again- discussed. Variable congestion not related to weather, scant clear. Denies chest  pain, nodes, shortness of breath. Tried Breo- too expensive. Rarely uses SABA.  ROS-see HPI Constitutional:   No-   weight loss, night sweats, fevers, chills, fatigue, lassitude. HEENT:   No-  headaches, difficulty swallowing, tooth/dental problems, sore throat,       No-  sneezing, itching, ear ache, nasal congestion, post nasal drip,  CV:  No-   chest pain, orthopnea, PND, swelling in lower extremities, anasarca, dizziness, palpitations Resp: No-shortness of breath with exertion or at rest.              No-   productive cough,  + non-productive cough,  No- coughing up of blood.              No-   change in color of mucus.  No- wheezing.   Skin: No-   rash or lesions. GI:  No-   heartburn, indigestion, abdominal pain, nausea, vomiting,  GU:  MS:  No-   joint pain or swelling.  . Neuro-     nothing unusual Psych:  No- change in mood or affect. No depression or anxiety.  No memory loss.  OBJ- Physical Exam General- Alert, Oriented, Affect-appropriate, Distress- none acute Skin- rash-none, lesions- none, excoriation- none Lymphadenopathy- none Head- atraumatic            Eyes- Gross vision intact, PERRLA, conjunctivae and secretions clear            Ears- Hearing, canals-normal            Nose- Clear, no-Septal dev, mucus, polyps, erosion, perforation             Throat- Mallampati II , mucosa clear , drainage- none, tonsils- atrophic, dentures Neck- flexible ,  trachea midline, no stridor , thyroid nl, carotid no bruit Chest - symmetrical excursion , unlabored           Heart/CV- RRR , no murmur , no gallop  , no rub, nl s1 s2                           - JVD- none , edema- none, stasis changes- none, varices- none           Lung- + few sticky squeeks, wheeze- none, cough- none , dullness-none, rub- none           Chest wall-  Abd-  Br/ Gen/ Rectal- Not done, not indicated Extrem- cyanosis- none, clubbing, none, atrophy- none, strength- nl Neuro- grossly intact to observation

## 2014-02-27 NOTE — Assessment & Plan Note (Signed)
Rhythm feels regular on exam

## 2014-02-27 NOTE — Patient Instructions (Signed)
Please try harder to stop smoking- you know you want to quit !  Flu vax  Please call as needed

## 2014-02-27 NOTE — Assessment & Plan Note (Signed)
Counseling done 

## 2014-03-08 DIAGNOSIS — Z720 Tobacco use: Secondary | ICD-10-CM | POA: Diagnosis not present

## 2014-03-08 DIAGNOSIS — I10 Essential (primary) hypertension: Secondary | ICD-10-CM | POA: Diagnosis not present

## 2014-03-08 DIAGNOSIS — E119 Type 2 diabetes mellitus without complications: Secondary | ICD-10-CM | POA: Diagnosis not present

## 2014-03-08 DIAGNOSIS — E039 Hypothyroidism, unspecified: Secondary | ICD-10-CM | POA: Diagnosis not present

## 2014-03-15 ENCOUNTER — Other Ambulatory Visit: Payer: Self-pay | Admitting: Cardiovascular Disease

## 2014-03-15 NOTE — Telephone Encounter (Signed)
Rx was sent to pharmacy electronically. Patient needs lab work

## 2014-03-27 ENCOUNTER — Ambulatory Visit: Payer: Medicare Other | Admitting: Cardiovascular Disease

## 2014-04-10 ENCOUNTER — Other Ambulatory Visit: Payer: Self-pay | Admitting: Internal Medicine

## 2014-04-10 MED ORDER — ALBUTEROL SULFATE 108 (90 BASE) MCG/ACT IN AEPB: 2.0000 | INHALATION_SPRAY | Freq: Four times a day (QID) | RESPIRATORY_TRACT | Status: DC | PRN

## 2014-04-26 ENCOUNTER — Encounter (HOSPITAL_COMMUNITY): Payer: Self-pay | Admitting: Cardiovascular Disease

## 2014-05-04 DIAGNOSIS — J209 Acute bronchitis, unspecified: Secondary | ICD-10-CM | POA: Diagnosis not present

## 2014-05-08 DIAGNOSIS — M7541 Impingement syndrome of right shoulder: Secondary | ICD-10-CM | POA: Diagnosis not present

## 2014-05-08 DIAGNOSIS — M25511 Pain in right shoulder: Secondary | ICD-10-CM | POA: Diagnosis not present

## 2014-05-09 DIAGNOSIS — R062 Wheezing: Secondary | ICD-10-CM | POA: Diagnosis not present

## 2014-05-09 DIAGNOSIS — J209 Acute bronchitis, unspecified: Secondary | ICD-10-CM | POA: Diagnosis not present

## 2014-05-14 ENCOUNTER — Other Ambulatory Visit: Payer: Self-pay | Admitting: Cardiovascular Disease

## 2014-05-14 NOTE — Telephone Encounter (Signed)
Rx refill sent to patient pharmacy   

## 2014-05-30 DIAGNOSIS — M25552 Pain in left hip: Secondary | ICD-10-CM | POA: Diagnosis not present

## 2014-06-11 ENCOUNTER — Other Ambulatory Visit: Payer: Self-pay | Admitting: Cardiovascular Disease

## 2014-06-11 NOTE — Telephone Encounter (Signed)
Rx sent to pharmacy   

## 2014-06-18 ENCOUNTER — Telehealth: Payer: Self-pay | Admitting: Cardiovascular Disease

## 2014-06-18 ENCOUNTER — Other Ambulatory Visit: Payer: Self-pay | Admitting: Cardiovascular Disease

## 2014-06-18 DIAGNOSIS — S53402A Unspecified sprain of left elbow, initial encounter: Secondary | ICD-10-CM | POA: Diagnosis not present

## 2014-06-18 DIAGNOSIS — M79602 Pain in left arm: Secondary | ICD-10-CM | POA: Diagnosis not present

## 2014-06-18 DIAGNOSIS — I519 Heart disease, unspecified: Secondary | ICD-10-CM | POA: Diagnosis not present

## 2014-06-18 NOTE — Telephone Encounter (Signed)
Close this encounter,already one on this pt.m

## 2014-06-18 NOTE — Telephone Encounter (Signed)
EKG reviewed by Dr. Claiborne Billings. Q-waves noted, but not concerning. Mammie Lorenzo, PA sent patient for cardiac enzymes as precautionary measure. She thanked Therapist, sports for follow up call

## 2014-06-18 NOTE — Telephone Encounter (Signed)
Please call,have the lab results for the enzyme test.

## 2014-06-18 NOTE — Telephone Encounter (Signed)
Rx refill sent to patient pharmacy   

## 2014-06-18 NOTE — Telephone Encounter (Signed)
Spoke with [place], [place]. CK-MB was elevated. Troponin normal. She will fax labs to our office.   This is in reference to patient who presented with left neck and arm pain - reproducible with range of motion and noted with activity - same arm as previous rotator cuff surgery. Went to UC and had EKG. Was reviewed by cards and our recent EKG from Sept was sent to [place], [place] for review

## 2014-06-18 NOTE — Telephone Encounter (Signed)
Spoke with Mammie Lorenzo, PA @ Urgent Care. Patient presents with left neck and arm pain - same side as rotator cuff repair. It has been going on for 4-5 days. It is reproducible with ROM and activity. He denies chest pain. C/O "vauge" shortness of breath but this is not unusual for him. They did an EKG, which Lattie Haw, [place] states is abnormal. Faxed over copy of last EKG from September. They will fax copy of their EKG performed today for review.

## 2014-06-18 NOTE — Telephone Encounter (Signed)
Shown results to Dr. Claiborne Billings. He advised PCP follow up to assess if MSK like symptoms have improved.

## 2014-06-18 NOTE — Telephone Encounter (Signed)
Brian Brock, [place] faxed over cardiac enzymes  CKMB    17.1 Relative % Index  3.5 Troponin I   <0.01 CPK Total   488  Routed to Dr. Claiborne Billings to review labs  Labs have been placed in scan-basket for Dr. Claiborne Billings to review

## 2014-06-19 NOTE — Telephone Encounter (Signed)
acknowledged

## 2014-06-22 ENCOUNTER — Telehealth: Payer: Self-pay | Admitting: *Deleted

## 2014-06-22 DIAGNOSIS — E782 Mixed hyperlipidemia: Secondary | ICD-10-CM | POA: Diagnosis not present

## 2014-06-22 DIAGNOSIS — Z125 Encounter for screening for malignant neoplasm of prostate: Secondary | ICD-10-CM | POA: Diagnosis not present

## 2014-06-22 DIAGNOSIS — E1165 Type 2 diabetes mellitus with hyperglycemia: Secondary | ICD-10-CM | POA: Diagnosis not present

## 2014-06-22 DIAGNOSIS — Z79899 Other long term (current) drug therapy: Secondary | ICD-10-CM | POA: Diagnosis not present

## 2014-06-22 DIAGNOSIS — Z0000 Encounter for general adult medical examination without abnormal findings: Secondary | ICD-10-CM | POA: Diagnosis not present

## 2014-06-22 DIAGNOSIS — R300 Dysuria: Secondary | ICD-10-CM | POA: Diagnosis not present

## 2014-06-22 DIAGNOSIS — E039 Hypothyroidism, unspecified: Secondary | ICD-10-CM | POA: Diagnosis not present

## 2014-06-22 NOTE — Telephone Encounter (Signed)
Called patient - left message to call back.   Had spoke with UC PA on Monday 2/1 r/t neck/shoulder pain - slightly abnormal EKG per PA (reviewed by Dr. Claiborne Billings) and elevated CKMB (labs ordered by UC PA - also reviewed by Dr. Claiborne Billings). Dr. Claiborne Billings advised to contact patient in a few days for follow up to make sure he was doing ok.

## 2014-06-26 DIAGNOSIS — M5416 Radiculopathy, lumbar region: Secondary | ICD-10-CM | POA: Diagnosis not present

## 2014-06-26 DIAGNOSIS — M9903 Segmental and somatic dysfunction of lumbar region: Secondary | ICD-10-CM | POA: Diagnosis not present

## 2014-06-26 DIAGNOSIS — M9902 Segmental and somatic dysfunction of thoracic region: Secondary | ICD-10-CM | POA: Diagnosis not present

## 2014-06-26 DIAGNOSIS — M5413 Radiculopathy, cervicothoracic region: Secondary | ICD-10-CM | POA: Diagnosis not present

## 2014-07-05 ENCOUNTER — Encounter: Payer: Self-pay | Admitting: *Deleted

## 2014-07-06 ENCOUNTER — Ambulatory Visit (INDEPENDENT_AMBULATORY_CARE_PROVIDER_SITE_OTHER): Payer: Medicare Other | Admitting: Physician Assistant

## 2014-07-06 ENCOUNTER — Encounter: Payer: Self-pay | Admitting: Physician Assistant

## 2014-07-06 ENCOUNTER — Encounter: Payer: Self-pay | Admitting: Cardiovascular Disease

## 2014-07-06 VITALS — BP 146/80 | HR 66 | Ht 69.0 in | Wt 195.3 lb

## 2014-07-06 DIAGNOSIS — E785 Hyperlipidemia, unspecified: Secondary | ICD-10-CM | POA: Diagnosis not present

## 2014-07-06 DIAGNOSIS — I10 Essential (primary) hypertension: Secondary | ICD-10-CM | POA: Diagnosis not present

## 2014-07-06 DIAGNOSIS — I200 Unstable angina: Secondary | ICD-10-CM

## 2014-07-06 DIAGNOSIS — I25110 Atherosclerotic heart disease of native coronary artery with unstable angina pectoris: Secondary | ICD-10-CM | POA: Diagnosis not present

## 2014-07-06 DIAGNOSIS — R0789 Other chest pain: Secondary | ICD-10-CM

## 2014-07-06 NOTE — Patient Instructions (Signed)
Your physician has requested that you have a lexiscan myoview. For further information please visit [email or web address]. Please follow instruction sheet, as given.  Your physician recommends that you schedule a follow-up appointment in: after myoview with Dr. Claiborne Billings or an extender for your test results.

## 2014-07-06 NOTE — Progress Notes (Addendum)
Patient ID: Brian Brock, male   DOB: [birth year], 69 y.o.   MRN: [id number]    Date:  07/06/2014   ID:  Brian Brock, DOB [birth year], MRN [id number]  PCP:  [name], MD  Primary Cardiologist:  Claiborne Billings   Chief Complaint  Patient presents with  . Follow-up    First of the month experienced diaphoresis and SOB for about 3 days - seen at [facility] - told EKG looked different.  Recently walked the dog in 28 degree weather and had a discomfort in chest with SOB.  BP found to be 180/82 after 10 minutes 113/72.  Yesterday picked up 4 40 lb bags of salt and experienced a discomfort across chest lasting about 10 minutes.     History of Present Illness: Brian Brock is a 70 y.o. male  with known coronary artery disease and also has a history of hypertension, hyperlipidemia, hypothyroidism, peripheral vascular disease and probable metabolic syndrome.  He has undergone prior percutaneous coronary interventions with stenting to his LAD and right coronary artery in 1997, 1998, and in 2004 to his proximal left circumflex coronary artery. He also has established peripheral vascular disease and underwent stenting to his left SFA in 2004 and right SFA in 2008. There is a history of tobacco use, hyperlipidemia in April 2004 he presented to [facility] with chest pain and T wave abnormalities. He was seen by Dr. Gwenlyn Found at that time and a nuclear perfusion study demonstrated reversible ischemia in the apical segment of the anterior wall. He underwent repeat cardiac catheterization on 09/07/2012 by me and was found to have preserved LV function with minimal focal region of mild inferior hypocontractility. He had significant progression of CAD with ostial 90% eccentric stenosis of the LAD extending into the distal left main proximal to a previously placed proximal LAD stent. There also is 90% stenosis of the LAD after the stented segment. The circumflex vessel is 60% ostial stenosis followed by  60% proximal stenosis prior to the previously placed stent. The RCA stent was patent. CABG surgery was recommended and this was done by Dr. Roxan Hockey on 09/12/2012 with an off-pump LIMA to LAD and SVG to circumflex marginal 1 vessel.  He does have a history of peripheral vascular disease and is status post right common femoral and proximal SFA stenting with left SFA stenting. He underwent a one-year followup LE dopplers on 04/18/2013. ABIs were excellent bilaterally at 1.1. It is less than 49% diameter reduction noted in the right common iliac, right common femoral and SFA stent, and was less than 50% diameter reduction in the right distal SFA. There was 50-69% diameter reduction in the left external iliac artery and less than 49% and left common femoral. The left SFA stent was widely patent.   An NMR profile revealed a significant increase LDL particle #[id number] due to predominant increase in small LDL particles at 1304. He did have significant increased insulin resistance cord 75 concordant with an atherogenic dyslipidemia pattern and metabolic syndrome.  He presents chest discomfort, hypertension, diaphoresis. On February 1 experienced an episode of diaphoresis of breath.  The shortness of breath lasted approximately 3 days. Chest pain lasted for about 10 minutes.  This past Monday he also had some chest pain she described as a hurt like microwave of sensation which spread up and was arms. He checks his blood pressure was in the 180s in about 10 minutes later it subsided. His blood pressure returned normal. Yesterday he was moving  40 pound bags of salt he had put him into a wheelbarrow. This caused him to develop shortness of breath and chest pain. His blood pressure was elevated in the 170s as it did previously. And then after sure. His blood pressure returned to normal. 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  this was not for prolonged period he did not have any  symptoms.  Labs collected on 06/22/2014: Total cholesterol 151, triglycerides 170, HDL 37, LDL 80, free T4 1 1.02.  The patient currently denies nausea, vomiting, fever, orthopnea, dizziness, PND, cough, congestion, abdominal pain, hematochezia, melena, lower extremity edema, claudication.  Wt Readings from Last 3 Encounters:  07/06/14 195 lb 4.8 oz (88.587 kg)  02/27/14 189 lb (85.73 kg)  02/14/14 186 lb 9.6 oz (84.641 kg)     Past Medical History  Diagnosis Date  . Coronary artery disease     Multiple stents  . Myocardial infarct   . Asthma   . COPD (chronic obstructive pulmonary disease)   . Peripheral vascular disease     prior stenting  . Dyslipidemia   . Hyperlipemia   . Sleep apnea 12/29/2004    sleep study  interpretation  . Allergic rhinitis     Current Outpatient Prescriptions  Medication Sig Dispense Refill  . Albuterol Sulfate (PROAIR RESPICLICK) 209 (90 BASE) MCG/ACT AEPB Inhale 2 puffs into the lungs 4 (four) times daily as needed. 1 each prn  . aspirin EC 81 MG tablet Take 81 mg by mouth daily.    [name] atorvastatin (LIPITOR) 40 MG tablet Take 1 tablet (40 mg total) by mouth daily. *NEED LAB WORK* 30 tablet 0  . cetirizine (ZYRTEC) 10 MG tablet Take 10 mg by mouth daily.    . cholestyramine (QUESTRAN) 4 G packet Take 1 packet by mouth 2 (two) times daily with a meal. 60 each 11  . ciprofloxacin (CIPRO) 500 MG tablet Take 1 tablet by mouth 2 (two) times daily.    [name] levothyroxine (SYNTHROID, LEVOTHROID) 50 MCG tablet Take 1 tablet by mouth daily.    [name] losartan (COZAAR) 100 MG tablet TAKE ONE TABLET BY MOUTH EVERY DAY 90 tablet 1  . nabumetone (RELAFEN) 500 MG tablet Take 1 tablet by mouth 2 (two) times daily.    [name] NITROSTAT 0.4 MG SL tablet as needed.    . NON FORMULARY daily. lacithin granules 2 TBSP daily.    . pantoprazole (PROTONIX) 40 MG tablet Take 1 tablet by mouth daily.     No current facility-administered medications for this visit.    Allergies:     Allergies  Allergen Reactions  . Metoprolol Other (See Comments)    Fatigue and malaise  . Amoxicillin-Pot Clavulanate     Unknown reaction - possibly rash or increased heart (patient cannot remember)   . Fish Oil   . Levofloxacin     Unknown reaction  . Meloxicam     Unknown reaction  . Metaxalone     Unknown reaction  . Niaspan [Niacin]   . Sulfonamide Derivatives     unknown reaction - bad rash  . Tricor [Fenofibrate]     Social History:  The patient  reports that he has been smoking Cigarettes.  He has a 27 pack-year smoking history. He does not have any smokeless tobacco history on file. He reports that he does not drink alcohol or use illicit drugs.   Family history:   Family History  Problem Relation Age of Onset  .  Coronary artery disease Father 44  . Heart attack Father   . Coronary artery disease Brother 30    CABG in 2000    ROS:  Please see the history of present illness.  All other systems reviewed and negative.   PHYSICAL EXAM: VS:  BP 146/80 mmHg  Pulse 66  Ht 5\' 9"  (1.753 m)  Wt 195 lb 4.8 oz (88.587 kg)  BMI 28.83 kg/m2 Well nourished, well developed, in no acute distress HEENT: Pupils are equal round react to light accommodation extraocular movements are intact.  Neck: no JVDNo cervical lymphadenopathy. Cardiac: Regular rate and rhythm without murmurs rubs or gallops. Lungs:  clear to auscultation bilaterally, no wheezing, rhonchi or rales Abd: soft, nontender, positive bowel sounds all quadrants, no hepatosplenomegaly Ext: no lower extremity edema.  2+ radial and dorsalis pedis pulses. Skin: warm and dry Neuro:  Grossly normal  EKG:  Normal sinus rhythm rate 66 bpm T-wave inversion in aVL   ASSESSMENT AND PLAN:  Problem List Items Addressed This Visit    Unstable angina    Patient's episodes of chest discomfort seem to be correlated with the exertion. Symptoms are not similar to his previous angina however, they have subsided with rest. It  may be that his blood pressure is spiking and then he is developing this chest discomfort. This resolves usually after about 10 minutes. On one occasion he had been walking his dog and cold air. Perhaps some of this is a bronchoconstriction. Patient be scheduled for Lexiscan Myoview to rule out a cardiac etiology.  EKG shows no acute changes. He is in normal sinus rhythm with a rate of 66 bpm. The EKG from urgent care is scanned in the computer did show more prominent Q waves inferiorly. Lateral T-wave inversion was on prior EKGs      Relevant Medications   NITROSTAT 0.4 MG SL tablet   Hyperlipidemia (Chronic)    Continue statin      Relevant Medications   NITROSTAT 0.4 MG SL tablet   HTN (hypertension)    Blood pressure is currently elevated mildly. His blood pressure is usually well controlled at home except for when he is having these episodes of chest discomfort. He will use the sublingual nitroglycerin to help with blood pressure during these intermittent episodes. He'll use it cautiously is not to make himself hypotensive we discussed waiting 10 minutes in between each dose.      Relevant Medications   NITROSTAT 0.4 MG SL tablet   CAD (coronary artery disease), native coronary artery - Severe Ostial & Proximal LAD ~90%; Ostial Circumflex ostial ~60%; Plan CABG   Relevant Medications   NITROSTAT 0.4 MG SL tablet    Other Visit Diagnoses    Chest discomfort    -  Primary    Relevant Orders    EKG 12-Lead    Myocardial Perfusion Imaging

## 2014-07-06 NOTE — Assessment & Plan Note (Addendum)
Patient's episodes of chest discomfort seem to be correlated with the exertion. Symptoms are not similar to his previous angina however, they have subsided with rest. It may be that his blood pressure is spiking and then he is developing this chest discomfort. This resolves usually after about 10 minutes. On one occasion he had been walking his dog and cold air. Perhaps some of this is a bronchoconstriction. Patient be scheduled for Lexiscan Myoview to rule out a cardiac etiology.  EKG shows no acute changes. He is in normal sinus rhythm with a rate of 66 bpm. The EKG from urgent care is scanned in the computer did show more prominent Q waves inferiorly. Lateral T-wave inversion was on prior EKGs

## 2014-07-06 NOTE — Assessment & Plan Note (Signed)
Blood pressure is currently elevated mildly. His blood pressure is usually well controlled at home except for when he is having these episodes of chest discomfort. He will use the sublingual nitroglycerin to help with blood pressure during these intermittent episodes. He'll use it cautiously is not to make himself hypotensive we discussed waiting 10 minutes in between each dose.

## 2014-07-06 NOTE — Assessment & Plan Note (Signed)
Continue statin. 

## 2014-07-10 ENCOUNTER — Encounter: Payer: Self-pay | Admitting: Cardiovascular Disease

## 2014-07-11 ENCOUNTER — Telehealth (HOSPITAL_COMMUNITY): Payer: Self-pay

## 2014-07-11 NOTE — Telephone Encounter (Signed)
Encounter complete. 

## 2014-07-13 ENCOUNTER — Ambulatory Visit (HOSPITAL_COMMUNITY)
Admission: RE | Admit: 2014-07-13 | Discharge: 2014-07-13 | Disposition: A | Discharge status: Home / Self Care | Payer: Medicare Other | Source: Ambulatory Visit | Attending: Cardiology | Admitting: Cardiology

## 2014-07-13 DIAGNOSIS — Z8249 Family history of ischemic heart disease and other diseases of the circulatory system: Secondary | ICD-10-CM | POA: Diagnosis not present

## 2014-07-13 DIAGNOSIS — J449 Chronic obstructive pulmonary disease, unspecified: Secondary | ICD-10-CM | POA: Insufficient documentation

## 2014-07-13 DIAGNOSIS — R0609 Other forms of dyspnea: Secondary | ICD-10-CM | POA: Diagnosis not present

## 2014-07-13 DIAGNOSIS — Z951 Presence of aortocoronary bypass graft: Secondary | ICD-10-CM | POA: Insufficient documentation

## 2014-07-13 DIAGNOSIS — R0789 Other chest pain: Secondary | ICD-10-CM | POA: Insufficient documentation

## 2014-07-13 DIAGNOSIS — J45909 Unspecified asthma, uncomplicated: Secondary | ICD-10-CM | POA: Diagnosis not present

## 2014-07-13 DIAGNOSIS — E669 Obesity, unspecified: Secondary | ICD-10-CM | POA: Diagnosis not present

## 2014-07-13 DIAGNOSIS — I2510 Atherosclerotic heart disease of native coronary artery without angina pectoris: Secondary | ICD-10-CM | POA: Diagnosis not present

## 2014-07-13 DIAGNOSIS — I480 Paroxysmal atrial fibrillation: Secondary | ICD-10-CM | POA: Diagnosis not present

## 2014-07-13 DIAGNOSIS — I10 Essential (primary) hypertension: Secondary | ICD-10-CM | POA: Diagnosis not present

## 2014-07-13 DIAGNOSIS — I252 Old myocardial infarction: Secondary | ICD-10-CM | POA: Diagnosis not present

## 2014-07-13 MED ORDER — REGADENOSON 0.4 MG/5ML IV SOLN
0.4000 mg | Freq: Once | INTRAVENOUS | Status: AC
  Administered 2014-07-13: 0.4 mg via INTRAVENOUS

## 2014-07-13 MED ORDER — AMINOPHYLLINE 25 MG/ML IV SOLN
75.0000 mg | Freq: Once | INTRAVENOUS | Status: AC
  Administered 2014-07-13: 75 mg via INTRAVENOUS

## 2014-07-13 MED ORDER — TECHNETIUM TC 99M SESTAMIBI GENERIC - CARDIOLITE
10.5000 | Freq: Once | INTRAVENOUS | Status: AC | PRN
  Administered 2014-07-13: 11 via INTRAVENOUS

## 2014-07-13 MED ORDER — TECHNETIUM TC 99M SESTAMIBI GENERIC - CARDIOLITE
30.4000 | Freq: Once | INTRAVENOUS | Status: AC | PRN
  Administered 2014-07-13: 30 via INTRAVENOUS

## 2014-07-13 NOTE — Procedures (Addendum)
[place] [address] [place] [address] [phone]  Cardiology Nuclear Med Study  Brian Brock is a 69 y.o. male     MRN : [id number]     DOB: 1945-09-19  Procedure Date: 07/13/2014  Nuclear Med Background Indication for Stress Test:  Evaluation for Ischemia and Abnormal EKG History:  Asthma, COPD and CAD;MI;CABG x4-08/2012;STENT/PTCA- 8;PAF;BRADYCARDIA;CHRONIC BRONCHITIS Cardiac Risk Factors: Family History - CAD, Hypertension, Lipids, Obesity and PVD  Symptoms:  Chest Pain, DOE and Palpitations   Nuclear Pre-Procedure Caffeine/Decaff Intake:  7:00pm NPO After: 3:00am   IV Site: R Forearm  IV 0.9% NS with Angio Cath:  22g  Chest Size (in):  46" IV Started by: Rolene Course, RN  Height: 5\' 9"  (1.753 m)  Cup Size: n/a  BMI:  Body mass index is 28.78 kg/(m^2). Weight:  195 lb (88.451 kg)   Tech Comments:  n/a    Nuclear Med Study 1 or 2 day study: 1 day  Stress Test Type:  [place] Provider:  Shelva Majestic, MD   Resting Radionuclide: Technetium 90m Sestamibi  Resting Radionuclide Dose: 10.5 mCi   Stress Radionuclide:  Technetium 74m Sestamibi  Stress Radionuclide Dose: 30.4 mCi           Stress Protocol Rest HR: 56 Stress HR: 90  Rest BP: 125/77 Stress BP: 151/63  Exercise Time (min): n/a METS: n/a          Dose of Adenosine (mg):  n/a Dose of Lexiscan: 0.4 mg  Dose of Atropine (mg): n/a Dose of Dobutamine: n/a mcg/kg/min (at max HR)  Stress Test Technologist: Mellody Memos, CCT Nuclear Technologist: Imagene Riches, CNMT   Rest Procedure:  Myocardial perfusion imaging was performed at rest 45 minutes following the intravenous administration of Technetium 11m Sestamibi. Stress Procedure:  The patient received IV Lexiscan 0.4 mg over 15-seconds.  Technetium 58m Sestamibi injected IV at 30-seconds.  Patient experienced shortness of breath, head pressure, flushing and was administered 75  mg of Aminophylline IV at 5 minutes. There were no significant changes with Lexiscan.  Quantitative spect images were obtained after a 45 minute delay.  Transient Ischemic Dilatation (Normal <1.22):  1.20  QGS EDV:  113 ml QGS ESV:  55 ml LV Ejection Fraction: 52%    Rest ECG: Sinus bradycardia with lateral T wave changes.  Stress ECG: No significant ST segment change suggestive of ischemia.  QPS Raw Data Images:  Acquisition technically good; normal left ventricular size. Stress Images:  There is decreased uptake in the anterior wall and apex. Rest Images:  There is decreased uptake in the apex. Subtraction (SDS):  These findings are consistent with ischemia.  Impression Exercise Capacity:  Lexiscan with no exercise. BP Response:  Normal blood pressure response. Clinical Symptoms:  There is dyspnea. ECG Impression:  No significant ST segment change suggestive of ischemia. Comparison with Prior Nuclear Study: Compared to 09/06/12, ischemia is worse  Overall Impression:  High risk stress nuclear study with a large, severe, partially reversible anterior and apical defect consistent with prior apical infarct and severe ischemia in the distal anterior wall and apex.  LV Wall Motion:  Apical hypokinesis.   Kirk Ruths, MD  07/13/2014 12:37 PM

## 2014-07-16 ENCOUNTER — Other Ambulatory Visit: Payer: Self-pay | Admitting: Physician Assistant

## 2014-07-16 DIAGNOSIS — I200 Unstable angina: Secondary | ICD-10-CM

## 2014-07-16 MED ORDER — ISOSORBIDE MONONITRATE ER 30 MG PO TB24: 15.0000 mg | ORAL_TABLET | Freq: Every day | ORAL | Status: DC

## 2014-07-17 ENCOUNTER — Encounter: Payer: Self-pay | Admitting: Cardiovascular Disease

## 2014-07-17 ENCOUNTER — Ambulatory Visit (INDEPENDENT_AMBULATORY_CARE_PROVIDER_SITE_OTHER): Payer: Medicare Other | Admitting: Cardiovascular Disease

## 2014-07-17 VITALS — BP 135/76 | HR 76 | Ht 69.0 in | Wt 194.8 lb

## 2014-07-17 DIAGNOSIS — E8881 Metabolic syndrome: Secondary | ICD-10-CM

## 2014-07-17 DIAGNOSIS — E785 Hyperlipidemia, unspecified: Secondary | ICD-10-CM

## 2014-07-17 DIAGNOSIS — I25119 Atherosclerotic heart disease of native coronary artery with unspecified angina pectoris: Secondary | ICD-10-CM | POA: Diagnosis not present

## 2014-07-17 DIAGNOSIS — I2510 Atherosclerotic heart disease of native coronary artery without angina pectoris: Secondary | ICD-10-CM

## 2014-07-17 DIAGNOSIS — I739 Peripheral vascular disease, unspecified: Secondary | ICD-10-CM

## 2014-07-17 DIAGNOSIS — I480 Paroxysmal atrial fibrillation: Secondary | ICD-10-CM | POA: Diagnosis not present

## 2014-07-17 DIAGNOSIS — R9439 Abnormal result of other cardiovascular function study: Secondary | ICD-10-CM

## 2014-07-17 DIAGNOSIS — Z01812 Encounter for preprocedural laboratory examination: Secondary | ICD-10-CM | POA: Diagnosis not present

## 2014-07-17 DIAGNOSIS — I200 Unstable angina: Secondary | ICD-10-CM

## 2014-07-17 MED ORDER — METOPROLOL TARTRATE 25 MG PO TABS: 25.0000 mg | ORAL_TABLET | Freq: Two times a day (BID) | ORAL | Status: DC

## 2014-07-17 MED ORDER — LORAZEPAM 0.5 MG PO TABS: ORAL_TABLET | ORAL | Status: DC

## 2014-07-17 MED ORDER — LORAZEPAM 0.5 MG PO TABS: 0.5000 mg | ORAL_TABLET | Freq: Three times a day (TID) | ORAL | Status: DC

## 2014-07-17 NOTE — Patient Instructions (Addendum)
A chest x-ray takes a picture of the organs and structures inside the chest, including the heart, lungs, and blood vessels. This test can show several things, including, whether the heart is  enlarges; whether fluid is building up in the lungs. ( have done the same time as the blood work)  Your physician recommends that you return for lab work between 07/18/14 and 07/20/14  Your physician recommends that you schedule a follow-up appointment 2 weeks following the cath. This will be scheduled at the time of your discharge, and will probably be with the extender.  Your physician has requested that you have a cardiac catheterization. Cardiac catheterization is used to diagnose and/or treat various heart conditions. Doctors may recommend this procedure for a number of different reasons. The most common reason is to evaluate chest pain. Chest pain can be a symptom of coronary artery disease (CAD), and cardiac catheterization can show whether plaque is narrowing or blocking your heart's arteries. This procedure is also used to evaluate the valves, as well as measure the blood flow and oxygen levels in different parts of your heart. For further information please visit [email or web address]. Please follow instruction sheet, as given.  this will be done March 8th by Dr. Claiborne Billings.  Your physician has recommended you make the following change in your medication: INCREASE the isosorbide 30 mg to 1 pill. If symptoms persist increase to 2 tablets. Start new prescription for ativan.

## 2014-07-17 NOTE — Progress Notes (Signed)
Patient ID: [name], male   DOB: 01/05/1946, 69 y.o.   MRN: [id number]     HPI: FILMORE MOLYNEUX is a 69 y.o. male who is a former patient of Dr. Terance Ice. He had established cardiology care with me in October 2014 prior to undergoing rotator cuff surgery by Dr. Ardyth Gal.  I last saw him in September 2015.  He was recently evaluated by [name] with complaints of chest pain and presents for follow-up evaluation.  Mr. Layton has known CAD and has a history of hypertension, hyperlipidemia, hypothyroidism, peripheral vascular disease,probable metabolic syndrome and ongoing tobacco use. He has undergone prior percutaneous coronary interventions with stenting to his LAD and RCA  in 1997, 1998, and in 2004 to his proximal left circumflex coronary artery. He also has established PVD and underwent stenting to his left SFA in 2004 and right SFA in 2008. There is a history of tobacco use, hyperlipidemia and in April 2014 he presented to [facility] with chest pain and T wave abnormalities.  A nuclear perfusion study demonstrated reversible ischemia in the apical segment of the anterior wall. He underwent repeat cardiac catheterization on 09/07/2012 by me and was found to have preserved LV function with minimal focal region of mild inferior hypocontractility. He had significant progression of CAD with ostial 90% eccentric stenosis of the LAD extending into the distal left main proximal to a previously placed proximal LAD stent. There also is 90% stenosis of the LAD after the stented segment. The circumflex vessel is 60% ostial stenosis followed by 60% proximal stenosis prior to the previously placed stent. The RCA stent was patent.  CABG surgery was recommended and this was done by Dr. Roxan Hockey on 09/12/2012 with an off-pump LIMA to LAD and SVG to circumflex marginal 1 vessel.  When I saw Mr. Peddie, I gave him clearance to undergo rotator cuff surgery. He  tolerated this well.  He  denies recent anginal symptoms. He does have a history of peripheral vascular disease and is status post right common femoral and proximal SFA stenting with left SFA stenting. He underwent a one-year followup [place] a duplex study on 04/18/2013. ABIs were excellent bilaterally at 1.1. It is less than 49% diameter reduction noted in the right common iliac, right common femoral and SFA stent, and was less than 50% diameter reduction in the right distal SFA. There was 50-69% diameter reduction in the left external iliac artery and less than 49% and left common femoral. The left SFA stent was widely patent.  He states that he was remaining stable until February 1, when he developed an episode of increasing shortness of breath associated with diaphoresis.  Two weeks later in the cold weather he developed severe chest tightness with shortness of breath and blood pressure elevation to 253 systolically.  He was seen by [name] on 07/06/2014 after he had again developed some chest pain while moving 40 pounds of bags of salt that he had put in a wheelbarrow.  I reviewed his office note. He was referred for a nuclear perfusion study which was done on 07/13/2014.  This was significantly changed from his prior study and now was felt to be high risk with a large severe partially reversible anterior apical defect consistent with prior apical infarct, but now with evidence for severe ischemia in the distal anterior wall and apex.  There was apical hypokinesis.  Ejection fraction was 52%.  He was started on Imdur 15 mg by [name] and  presents to the office to see me today for follow-up evaluation.  Unfortunately he is still smoking approximately 7 cigarettes per day.   Past Medical History  Diagnosis Date  . Coronary artery disease     Multiple stents  . Myocardial infarct   . Asthma   . COPD (chronic obstructive pulmonary disease)   . Peripheral vascular disease     prior stenting  . Dyslipidemia   .  Hyperlipemia   . Sleep apnea 12/29/2004    sleep study  interpretation  . Allergic rhinitis     Past Surgical History  Procedure Laterality Date  . Coronary stent placement  '97, '98, '04    seven cardiac stents  . Peripheral vascular dis Bilateral 7/06  8/08    Lt SFA '06, Rt SFA 8/08  . Cholecystectomy  June 2010  . Left heart catheterization with coronary angiogram N/A 09/07/2012    Procedure: LEFT HEART CATHETERIZATION WITH CORONARY ANGIOGRAM;  Surgeon: Troy Sine, MD;  Location: [facility] CATH LAB;  Service: Cardiovascular;  Laterality: N/A;  . Coronary artery bypass graft N/A 09/12/2012    Procedure: OFF PUMP CORONARY ARTERY BYPASS GRAFTING (CABG);  Surgeon: Melrose Nakayama, MD;  Location: [place];  Service: Open Heart Surgery;  Laterality: N/A;  Times two using left internal mammary artery and endoscopically harvested right saphenous vein  ;LIMA-LAD and SVG -OM1  . Cardiac catheterization  2004    had remote tandem stents in 1997 and 1998 for LAD lesions, RCA stent in 1997 , midprto=ion, and a proximal RCA  SENT IN 1999, all DES; some were IVIS  guide.  . Coronary angioplasty    . Cardiac catheterization  09/07/2012    demonstrated severe 2- vessel disease,patent LAD,patient circ and patent right stents, but 90% ostial LAD whic was new.  . Doppler echocardiography  11/23/2012,09/30/2010     LV EF 50-55%----09/30/2010-LV EF = >55%  . Lexiscan stress test  09/06/2012  . Nm myoview ltd  05/06/2012    EF -STUDY NOT GATED, NORMAL STRESS NUCLEAR ,NORMAL LV FUNCTION  . Lower arterial doppler  03/25/2012    FGT CIA 0-49%; RGT CFA AND SFA AT STENT 0-49%; RGT DISTAL SFA = <50%; LFT. EIA  50-69%; LFT. CFA 0-49% ;;LFT. SFA  AT STENT NORMAL PATENCY  . Carotid doppler  100/24/11    RGT BULB and PROXIMAL ICA 0-49%; LFT. ICA  0-49 ; RGT SUBCLAVIAN ARTERY < 50% ; LFT SUBCLAVIAN normal patency; RGT  and LFT  VERTEBRAL ARTERIES  . Event monitor  11/04/2012-11/17/2012    Allergies  Allergen  Reactions  . Metoprolol Other (See Comments)    Fatigue and malaise  . Fish Oil   . Levofloxacin     Unknown reaction  . Meloxicam     Unknown reaction  . Metaxalone     Unknown reaction  . Niaspan [Niacin]   . Sulfonamide Derivatives     unknown reaction - bad rash  . Tricor [Fenofibrate]     Current Outpatient Prescriptions  Medication Sig Dispense Refill  . Albuterol Sulfate (PROAIR RESPICLICK) 462 (90 BASE) MCG/ACT AEPB Inhale 2 puffs into the lungs 4 (four) times daily as needed. 1 each prn  . aspirin EC 81 MG tablet Take 81 mg by mouth daily.    [name] atorvastatin (LIPITOR) 40 MG tablet Take 1 tablet (40 mg total) by mouth daily. *NEED LAB WORK* 30 tablet 0  . cetirizine (ZYRTEC) 10 MG tablet Take 10 mg by mouth daily.    [name]  cholestyramine (QUESTRAN) 4 G packet Take 1 packet by mouth 2 (two) times daily with a meal. 60 each 11  . isosorbide mononitrate (IMDUR) 30 MG 24 hr tablet Take 15 mg by mouth daily.     [name] levothyroxine (SYNTHROID, LEVOTHROID) 50 MCG tablet Take 1 tablet by mouth daily.    [name] losartan (COZAAR) 100 MG tablet TAKE ONE TABLET BY MOUTH EVERY DAY 90 tablet 1  . nabumetone (RELAFEN) 500 MG tablet Take 1 tablet by mouth 2 (two) times daily.    [name] NITROSTAT 0.4 MG SL tablet as needed.    . NON FORMULARY daily. lacithin granules 2 TBSP daily.    . pantoprazole (PROTONIX) 40 MG tablet Take 1 tablet by mouth daily.     No current facility-administered medications for this visit.    History   Social History  . Marital Status: Married    Spouse Name: N/A  . Number of Children: N/A  . Years of Education: N/A   Occupational History  . Not on file.   Social History Main Topics  . Smoking status: Current Every Day Smoker -- 0.50 packs/day for 54 years    Types: Cigarettes  . Smokeless tobacco: Not on file  . Alcohol Use: No  . Drug Use: No  . Sexual Activity: Not on file   Other Topics Concern  . 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 Heis married , is one child and 4 grandchildren.[name]  He continues to work part time in [employer].  His work is physical and often requires lifting.  He had smoked heavily for 54 years. He is now smoking 3-7 cigarettes per day.  He does not drink alcohol or use illicit drugs.  Family History  Problem Relation Age of Onset  . Coronary artery disease Father 29  . Heart attack Father   . Coronary artery disease Brother 6    CABG in 2000    ROS General: Negative; No fevers, chills, or night sweats;  HEENT: Negative; No changes in vision or hearing, sinus congestion, difficulty swallowing Pulmonary: Negative; No cough, wheezing, shortness of breath, hemoptysis Cardiovascular: Negative; No chest pain, presyncope, syncope, palpitations GI: Negative; No nausea, vomiting, diarrhea, or abdominal pain GU: Negative; No dysuria, hematuria, or difficulty voiding Musculoskeletal: Improved range of motion in the left shoulder, but also notes intermittent twinges of pain; no myalgias, or weakness Hematologic/Oncology: Negative; no easy bruising, bleeding Endocrine: Negative; no heat/cold intolerance; no diabetes Neuro: Negative; no changes in balance, headaches Skin: Negative; No rashes or skin lesions Psychiatric: Negative; No behavioral problems, depression Sleep: Negative; No snoring, daytime sleepiness, hypersomnolence, bruxism, restless legs, hypnogognic hallucinations, no cataplexy Other comprehensive 14 point system review is negative.  PE BP 135/76 mmHg  Pulse 76  Ht '5\' 9"'  (1.753 m)  Wt 194 lb 12.8 oz (88.361 kg)  BMI 28.75 kg/m2  General: Alert, oriented, no distress.  Skin: normal turgor, no rashes HEENT: Normocephalic, atraumatic. Pupils round and reactive; sclera anicteric;no lid lag.  Nose without nasal septal hypertrophy Mouth/Parynx benign; Mallinpatti scale 3 Neck: No JVD, no carotid bruits Lungs: Decreased breath sounds without audible wheezing  or rales Chest wall: Nontender to palpation Heart: RRR, s1 s2 normal with a 2/6 systolic murmur at the left sternal border. No diastolic murmur. Abdomen: soft, nontender; no hepatosplenomehaly, BS+; abdominal aorta nontender and not dilated by palpation. Pulses: Slightly decreased pulses at the dorsalis pedis and PT bilaterally. Back:  No CVA tenderness Extremities: no clubbing cyanosis or edema, Homan's sign negative  Neurologic: grossly nonfocal; cranial nerves intact Psychologic: normal affect and mood.  Prior ECG (independently read by me): Normal sinus rhythm at 66 beats per minute.  No ectopy.  PR interval 150 ms.  QTc interval 421 ms  January 2015 ECG (independently read by me): Normal sinus rhythm at 67 beats per minute; normal intervals.  LABS:  BMET  BMP Latest Ref Rng 09/17/2012 09/15/2012 09/14/2012  Glucose 70 - 99 mg/dL 91 125(H) 128(H)  BUN 6 - 23 mg/dL '15 15 16  ' Creatinine 0.50 - 1.35 mg/dL 1.07 0.92 0.98  Sodium 135 - 145 mEq/L 138 134(L) 130(L)  Potassium 3.5 - 5.1 mEq/L 3.8 4.2 4.7  Chloride 96 - 112 mEq/L 100 96 96  CO2 19 - 32 mEq/L '28 27 27  ' Calcium 8.4 - 10.5 mg/dL 8.8 9.0 9.1     Hepatic Function Panel    Hepatic Function Latest Ref Rng 09/05/2012 11/05/2008  Total Protein 6.0 - 8.3 g/dL 6.4 6.2  Albumin 3.5 - 5.2 g/dL 3.2(L) 3.5  AST 0 - 37 U/L 38(H) 21  ALT 0 - 53 U/L 56(H) 27  Alk Phosphatase 39 - 117 U/L 94 84  Total Bilirubin 0.3 - 1.2 mg/dL 0.5 0.6    CBC  CBC Latest Ref Rng 09/17/2012 09/15/2012 09/14/2012  WBC 4.0 - 10.5 K/uL 6.6 8.0 13.3(H)  Hemoglobin 13.0 - 17.0 g/dL 10.2(L) 11.3(L) 11.4(L)  Hematocrit 39.0 - 52.0 % 29.6(L) 33.2(L) 33.6(L)  Platelets 150 - 400 K/uL 197 122(L) 125(L)     BNP No results found for: PROBNP  Lipid Panel     Component Value Date/Time   CHOL 155 09/06/2012 0455   TRIG 179* 09/06/2012 0455   HDL 52 09/06/2012 0455   CHOLHDL 3.0 09/06/2012 0455   VLDL 36 09/06/2012 0455   LDLCALC 67 09/06/2012 0455      RADIOLOGY: No results found.    ASSESSMENT AND PLAN: Mr. Antony Haste has established coronary as well as peripheral vascular disease.  He is 2 years status post CABG surgery in April 2014 by Dr. Roxan Hockey. Previously he had undergone prior stenting to his proximal LAD, circumflex coronary artery as well as right coronary artery.  Over the past month he has experienced several episodes of chest pressure and  Diaphoresis worrisome for recurrent angina..  His nuclear perfusion study reveals a significant area of ischemia and a region of scar in the distal LAD territory.  I reviewed this with his old study and showed both the old and the new study to both he and his wife.  He is recently been started on isosorbide mononitrate 15 mg.  I recommended he increase this to 30 mg and if tolerates, he can increase this to 60 mg daily.  I am starting him on very low-dose beta blocker therapy since in the past he did not tolerate higher doses due to significant fatigue.  I recommended definitive cardiac catheterization. The risks and benefits of a cardiac catheterization including, but not limited to, death, stroke, MI, kidney damage and bleeding were discussed with the patient who indicates understanding and agrees to proceed.  The catheterization will be performed week on 07/24/2014 at [facility].  A complete set of blood work will be obtained for the catheterization.  He is on atorvastatin for hyperlipidemia.  I discussed the absolute importance of smoking cessation.  I gave him a prescription for lorazepam to assist with his anxiety with  reference to quitting smoking this week.  Time spent: 40 minutes   Troy Sine, MD, [facility]  07/17/2014 2:44 PM

## 2014-07-18 ENCOUNTER — Ambulatory Visit
Admission: RE | Admit: 2014-07-18 | Discharge: 2014-07-18 | Disposition: A | Discharge status: Home / Self Care | Payer: Medicare Other | Source: Ambulatory Visit | Attending: Cardiovascular Disease | Admitting: Cardiovascular Disease

## 2014-07-18 ENCOUNTER — Telehealth: Payer: Self-pay | Admitting: Cardiovascular Disease

## 2014-07-18 DIAGNOSIS — R9439 Abnormal result of other cardiovascular function study: Secondary | ICD-10-CM

## 2014-07-18 DIAGNOSIS — R079 Chest pain, unspecified: Secondary | ICD-10-CM | POA: Diagnosis not present

## 2014-07-18 DIAGNOSIS — Z01812 Encounter for preprocedural laboratory examination: Secondary | ICD-10-CM

## 2014-07-18 DIAGNOSIS — I2510 Atherosclerotic heart disease of native coronary artery without angina pectoris: Secondary | ICD-10-CM

## 2014-07-18 LAB — APTT: aPTT: 33 seconds (ref 24–37)

## 2014-07-18 LAB — CBC
HCT: 44.8 % (ref 39.0–52.0)
Hemoglobin: 14.5 g/dL (ref 13.0–17.0)
MCH: 28.2 pg (ref 26.0–34.0)
MCHC: 32.4 g/dL (ref 30.0–36.0)
MCV: 87 fL (ref 78.0–100.0)
MPV: 9.8 fL (ref 8.6–12.4)
Platelets: 273 10*3/uL (ref 150–400)
RBC: 5.15 MIL/uL (ref 4.22–5.81)
RDW: 14.1 % (ref 11.5–15.5)
WBC: 12.7 10*3/uL — ABNORMAL HIGH (ref 4.0–10.5)

## 2014-07-18 LAB — TSH: TSH: 3.875 u[IU]/mL (ref 0.350–4.500)

## 2014-07-18 LAB — PROTIME-INR
INR: 0.94 (ref <1.50)
Prothrombin Time: 12.6 seconds (ref 11.6–15.2)

## 2014-07-18 NOTE — Telephone Encounter (Signed)
[facility] requested ICD-10 code for patient for his PTT. Provided this information and order was approved.

## 2014-07-19 ENCOUNTER — Other Ambulatory Visit: Payer: Self-pay | Admitting: *Deleted

## 2014-07-19 DIAGNOSIS — Z01818 Encounter for other preprocedural examination: Secondary | ICD-10-CM

## 2014-07-19 LAB — COMPREHENSIVE METABOLIC PANEL
ALT: 21 U/L (ref 0–53)
AST: 22 U/L (ref 0–37)
Albumin: 3.9 g/dL (ref 3.5–5.2)
Alkaline Phosphatase: 91 U/L (ref 39–117)
BUN: 18 mg/dL (ref 6–23)
CO2: 25 mEq/L (ref 19–32)
Calcium: 9.1 mg/dL (ref 8.4–10.5)
Chloride: 105 mEq/L (ref 96–112)
Creat: 1.05 mg/dL (ref 0.50–1.35)
Glucose, Bld: 59 mg/dL — ABNORMAL LOW (ref 70–99)
Potassium: 4.4 mEq/L (ref 3.5–5.3)
Sodium: 140 mEq/L (ref 135–145)
Total Bilirubin: 0.7 mg/dL (ref 0.2–1.2)
Total Protein: 6.2 g/dL (ref 6.0–8.3)

## 2014-07-20 ENCOUNTER — Other Ambulatory Visit: Payer: Self-pay | Admitting: Cardiovascular Disease

## 2014-07-20 NOTE — Telephone Encounter (Signed)
Rx(s) sent to pharmacy electronically.  

## 2014-07-23 ENCOUNTER — Other Ambulatory Visit: Payer: Self-pay | Admitting: *Deleted

## 2014-07-23 ENCOUNTER — Ambulatory Visit: Payer: BLUE CROSS/BLUE SHIELD | Admitting: Cardiovascular Disease

## 2014-07-23 DIAGNOSIS — R9439 Abnormal result of other cardiovascular function study: Secondary | ICD-10-CM

## 2014-07-24 ENCOUNTER — Encounter (HOSPITAL_COMMUNITY)
Admission: RE | Disposition: A | Discharge status: Home / Self Care | Payer: Medicare Other | Source: Ambulatory Visit | Attending: Cardiovascular Disease

## 2014-07-24 ENCOUNTER — Encounter (HOSPITAL_COMMUNITY): Payer: Self-pay | Admitting: General Practice

## 2014-07-24 ENCOUNTER — Ambulatory Visit (HOSPITAL_COMMUNITY)
Admission: RE | Admit: 2014-07-24 | Discharge: 2014-07-25 | Disposition: A | Discharge status: Home / Self Care | Payer: Medicare Other | Source: Ambulatory Visit | Attending: Cardiovascular Disease | Admitting: Cardiovascular Disease

## 2014-07-24 DIAGNOSIS — E785 Hyperlipidemia, unspecified: Secondary | ICD-10-CM | POA: Insufficient documentation

## 2014-07-24 DIAGNOSIS — Z8249 Family history of ischemic heart disease and other diseases of the circulatory system: Secondary | ICD-10-CM | POA: Diagnosis not present

## 2014-07-24 DIAGNOSIS — J449 Chronic obstructive pulmonary disease, unspecified: Secondary | ICD-10-CM | POA: Diagnosis not present

## 2014-07-24 DIAGNOSIS — Z951 Presence of aortocoronary bypass graft: Secondary | ICD-10-CM | POA: Diagnosis not present

## 2014-07-24 DIAGNOSIS — Z7982 Long term (current) use of aspirin: Secondary | ICD-10-CM | POA: Insufficient documentation

## 2014-07-24 DIAGNOSIS — F17210 Nicotine dependence, cigarettes, uncomplicated: Secondary | ICD-10-CM | POA: Diagnosis not present

## 2014-07-24 DIAGNOSIS — I2510 Atherosclerotic heart disease of native coronary artery without angina pectoris: Secondary | ICD-10-CM

## 2014-07-24 DIAGNOSIS — J45909 Unspecified asthma, uncomplicated: Secondary | ICD-10-CM | POA: Insufficient documentation

## 2014-07-24 DIAGNOSIS — I25810 Atherosclerosis of coronary artery bypass graft(s) without angina pectoris: Secondary | ICD-10-CM | POA: Insufficient documentation

## 2014-07-24 DIAGNOSIS — E039 Hypothyroidism, unspecified: Secondary | ICD-10-CM | POA: Diagnosis not present

## 2014-07-24 DIAGNOSIS — I10 Essential (primary) hypertension: Secondary | ICD-10-CM | POA: Insufficient documentation

## 2014-07-24 DIAGNOSIS — I252 Old myocardial infarction: Secondary | ICD-10-CM | POA: Diagnosis not present

## 2014-07-24 DIAGNOSIS — Z01818 Encounter for other preprocedural examination: Secondary | ICD-10-CM

## 2014-07-24 DIAGNOSIS — I739 Peripheral vascular disease, unspecified: Secondary | ICD-10-CM | POA: Insufficient documentation

## 2014-07-24 DIAGNOSIS — Z955 Presence of coronary angioplasty implant and graft: Secondary | ICD-10-CM | POA: Diagnosis not present

## 2014-07-24 HISTORY — PX: LEFT HEART CATHETERIZATION WITH CORONARY/GRAFT ANGIOGRAM: SHX5450

## 2014-07-24 HISTORY — DX: Essential (primary) hypertension: I10

## 2014-07-24 HISTORY — DX: Angina pectoris, unspecified: I20.9

## 2014-07-24 HISTORY — DX: Unspecified asthma, uncomplicated: J45.909

## 2014-07-24 HISTORY — DX: Unspecified chronic bronchitis: J42

## 2014-07-24 HISTORY — PX: PERCUTANEOUS CORONARY STENT INTERVENTION (PCI-S): SHX5485

## 2014-07-24 HISTORY — DX: Unspecified osteoarthritis, unspecified site: M19.90

## 2014-07-24 HISTORY — DX: Gastro-esophageal reflux disease without esophagitis: K21.9

## 2014-07-24 HISTORY — DX: Hypothyroidism, unspecified: E03.9

## 2014-07-24 LAB — POCT ACTIVATED CLOTTING TIME: Activated Clotting Time: 491 seconds

## 2014-07-24 LAB — TROPONIN I: Troponin I: 0.03 ng/mL (ref <0.031)

## 2014-07-24 SURGERY — LEFT HEART CATHETERIZATION WITH CORONARY/GRAFT ANGIOGRAM
Anesthesia: LOCAL | Site: Groin | Laterality: Right

## 2014-07-24 MED ORDER — ISOSORBIDE MONONITRATE ER 30 MG PO TB24
30.0000 mg | ORAL_TABLET | Freq: Every day | ORAL | Status: DC
  Administered 2014-07-24: 18:00:00 30 mg via ORAL
  Filled 2014-07-24 (×3): qty 1

## 2014-07-24 MED ORDER — CYCLOBENZAPRINE HCL 10 MG PO TABS: 10.0000 mg | ORAL_TABLET | Freq: Three times a day (TID) | ORAL | Status: DC | PRN

## 2014-07-24 MED ORDER — CHOLESTYRAMINE 4 G PO PACK
1.0000 | PACK | Freq: Two times a day (BID) | ORAL | Status: DC
  Administered 2014-07-24: 1 via ORAL
  Filled 2014-07-24 (×5): qty 1

## 2014-07-24 MED ORDER — PANTOPRAZOLE SODIUM 40 MG PO TBEC
40.0000 mg | DELAYED_RELEASE_TABLET | Freq: Every day | ORAL | Status: DC
  Administered 2014-07-24: 40 mg via ORAL
  Filled 2014-07-24 (×2): qty 1

## 2014-07-24 MED ORDER — ACETAMINOPHEN 325 MG PO TABS: 650.0000 mg | ORAL_TABLET | ORAL | Status: DC | PRN

## 2014-07-24 MED ORDER — NITROGLYCERIN 0.4 MG SL SUBL: 0.4000 mg | SUBLINGUAL_TABLET | SUBLINGUAL | Status: DC | PRN

## 2014-07-24 MED ORDER — ASPIRIN 81 MG PO CHEW
81.0000 mg | CHEWABLE_TABLET | ORAL | Status: AC
  Administered 2014-07-24: 81 mg via ORAL

## 2014-07-24 MED ORDER — ASPIRIN EC 81 MG PO TBEC: 81.0000 mg | DELAYED_RELEASE_TABLET | Freq: Every day | ORAL | Status: DC

## 2014-07-24 MED ORDER — LEVOTHYROXINE SODIUM 50 MCG PO TABS
50.0000 ug | ORAL_TABLET | Freq: Every day | ORAL | Status: DC
  Filled 2014-07-24 (×4): qty 1

## 2014-07-24 MED ORDER — DIAZEPAM 5 MG PO TABS
ORAL_TABLET | ORAL | Status: AC
  Administered 2014-07-24: 5 mg via ORAL
  Filled 2014-07-24: qty 1

## 2014-07-24 MED ORDER — METOPROLOL TARTRATE 25 MG PO TABS
25.0000 mg | ORAL_TABLET | Freq: Two times a day (BID) | ORAL | Status: DC
  Administered 2014-07-24: 12.5 mg via ORAL
  Filled 2014-07-24 (×2): qty 1

## 2014-07-24 MED ORDER — ATORVASTATIN CALCIUM 80 MG PO TABS
80.0000 mg | ORAL_TABLET | Freq: Every day | ORAL | Status: DC
  Filled 2014-07-24 (×3): qty 1

## 2014-07-24 MED ORDER — HYDRALAZINE HCL 20 MG/ML IJ SOLN
10.0000 mg | Freq: Once | INTRAMUSCULAR | Status: AC
  Administered 2014-07-24: 13:00:00 10 mg via INTRAVENOUS
  Filled 2014-07-24: qty 1

## 2014-07-24 MED ORDER — IBUPROFEN 200 MG PO TABS: 600.0000 mg | ORAL_TABLET | Freq: Four times a day (QID) | ORAL | Status: DC | PRN

## 2014-07-24 MED ORDER — ALBUTEROL SULFATE (2.5 MG/3ML) 0.083% IN NEBU: 2.5000 mg | INHALATION_SOLUTION | Freq: Four times a day (QID) | RESPIRATORY_TRACT | Status: DC | PRN

## 2014-07-24 MED ORDER — SODIUM CHLORIDE 0.9 % IV SOLN
INTRAVENOUS | Status: DC
  Administered 2014-07-24: 11:00:00 via INTRAVENOUS

## 2014-07-24 MED ORDER — HYDRALAZINE HCL 20 MG/ML IJ SOLN: 10.0000 mg | Freq: Once | INTRAMUSCULAR | Status: DC

## 2014-07-24 MED ORDER — SODIUM CHLORIDE 0.9 % IJ SOLN: 3.0000 mL | Freq: Two times a day (BID) | INTRAMUSCULAR | Status: DC

## 2014-07-24 MED ORDER — TICAGRELOR 90 MG PO TABS
90.0000 mg | ORAL_TABLET | Freq: Two times a day (BID) | ORAL | Status: DC
  Administered 2014-07-24 – 2014-07-25 (×2): 90 mg via ORAL
  Filled 2014-07-24 (×3): qty 1

## 2014-07-24 MED ORDER — HYDRALAZINE HCL 20 MG/ML IJ SOLN
INTRAMUSCULAR | Status: AC
  Administered 2014-07-24: 12:00:00 10 mg
  Filled 2014-07-24: qty 1

## 2014-07-24 MED ORDER — METOPROLOL TARTRATE 25 MG PO TABS: 25.0000 mg | ORAL_TABLET | Freq: Two times a day (BID) | ORAL | Status: DC

## 2014-07-24 MED ORDER — ASPIRIN 81 MG PO CHEW
CHEWABLE_TABLET | ORAL | Status: AC
  Administered 2014-07-24: 81 mg via ORAL
  Filled 2014-07-24: qty 1

## 2014-07-24 MED ORDER — ASPIRIN 81 MG PO CHEW: 81.0000 mg | CHEWABLE_TABLET | Freq: Every day | ORAL | Status: DC

## 2014-07-24 MED ORDER — DIAZEPAM 5 MG PO TABS
5.0000 mg | ORAL_TABLET | ORAL | Status: AC
  Administered 2014-07-24: 5 mg via ORAL

## 2014-07-24 MED ORDER — ONDANSETRON HCL 4 MG/2ML IJ SOLN: 4.0000 mg | Freq: Four times a day (QID) | INTRAMUSCULAR | Status: DC | PRN

## 2014-07-24 MED ORDER — SODIUM CHLORIDE 0.9 % IJ SOLN: 3.0000 mL | INTRAMUSCULAR | Status: DC | PRN

## 2014-07-24 MED ORDER — ATORVASTATIN CALCIUM 40 MG PO TABS: 40.0000 mg | ORAL_TABLET | Freq: Every day | ORAL | Status: DC

## 2014-07-24 MED ORDER — SODIUM CHLORIDE 0.9 % IV SOLN
250.0000 mL | INTRAVENOUS | Status: DC | PRN
Start: 2014-07-24 — End: 2014-07-24
  Administered 2014-07-24: 07:00:00 via INTRAVENOUS

## 2014-07-24 NOTE — Progress Notes (Signed)
Patient c/o SOB.  Lung sounds clear, sats upper 90's.  Possible Brilinta effect.  Given caffeine drinks.  Patient states that SOB is worse during inflation of SCD or BP cuff.  B Blocker held earlier for low HR.  Given this evening along with isosorbide.  Clarification needed on home dose of BB; gave 1/2 dose ordered per pt. pref.

## 2014-07-24 NOTE — H&P (View-Only) (Signed)
Patient ID: Brian Brock, male   DOB: 1945-07-27, 69 y.o.   MRN: [id number]     HPI: Brian Brock is a 69 y.o. male who is a former patient of Dr. Terance Ice. He had established cardiology care with me in October 2014 prior to undergoing rotator cuff surgery by Dr. Ardyth Gal.  I last saw him in September 2015.  He was recently evaluated by [name] with complaints of chest pain and presents for follow-up evaluation.  Brian Brock has known CAD and has a history of hypertension, hyperlipidemia, hypothyroidism, peripheral vascular disease,probable metabolic syndrome and ongoing tobacco use. He has undergone prior percutaneous coronary interventions with stenting to his LAD and RCA  in 1997, 1998, and in 2004 to his proximal left circumflex coronary artery. He also has established PVD and underwent stenting to his left SFA in 2004 and right SFA in 2008. There is a history of tobacco use, hyperlipidemia and in April 2014 he presented to [facility] with chest pain and T wave abnormalities.  A nuclear perfusion study demonstrated reversible ischemia in the apical segment of the anterior wall. He underwent repeat cardiac catheterization on 09/07/2012 by me and was found to have preserved LV function with minimal focal region of mild inferior hypocontractility. He had significant progression of CAD with ostial 90% eccentric stenosis of the LAD extending into the distal left main proximal to a previously placed proximal LAD stent. There also is 90% stenosis of the LAD after the stented segment. The circumflex vessel is 60% ostial stenosis followed by 60% proximal stenosis prior to the previously placed stent. The RCA stent was patent.  CABG surgery was recommended and this was done by Dr. Roxan Hockey on 09/12/2012 with an off-pump LIMA to LAD and SVG to circumflex marginal 1 vessel.  When I saw Brian Brock, I gave him clearance to undergo rotator cuff surgery. He  tolerated this well.  He  denies recent anginal symptoms. He does have a history of peripheral vascular disease and is status post right common femoral and proximal SFA stenting with left SFA stenting. He underwent a one-year followup [place] a duplex study on 04/18/2013. ABIs were excellent bilaterally at 1.1. It is less than 49% diameter reduction noted in the right common iliac, right common femoral and SFA stent, and was less than 50% diameter reduction in the right distal SFA. There was 50-69% diameter reduction in the left external iliac artery and less than 49% and left common femoral. The left SFA stent was widely patent.  He states that he was remaining stable until February 1, when he developed an episode of increasing shortness of breath associated with diaphoresis.  Two weeks later in the cold weather he developed severe chest tightness with shortness of breath and blood pressure elevation to 283 systolically.  He was seen by [name] on 07/06/2014 after he had again developed some chest pain while moving 40 pounds of bags of salt that he had put in a wheelbarrow.  I reviewed his office note. He was referred for a nuclear perfusion study which was done on 07/13/2014.  This was significantly changed from his prior study and now was felt to be high risk with a large severe partially reversible anterior apical defect consistent with prior apical infarct, but now with evidence for severe ischemia in the distal anterior wall and apex.  There was apical hypokinesis.  Ejection fraction was 52%.  He was started on Imdur 15 mg by [name] and  presents to the office to see me today for follow-up evaluation.  Unfortunately he is still smoking approximately 7 cigarettes per day.   Past Medical History  Diagnosis Date  . Coronary artery disease     Multiple stents  . Myocardial infarct   . Asthma   . COPD (chronic obstructive pulmonary disease)   . Peripheral vascular disease     prior stenting  . Dyslipidemia   .  Hyperlipemia   . Sleep apnea 12/29/2004    sleep study  interpretation  . Allergic rhinitis     Past Surgical History  Procedure Laterality Date  . Coronary stent placement  '97, '98, '04    seven cardiac stents  . Peripheral vascular dis Bilateral 7/06  8/08    Lt SFA '06, Rt SFA 8/08  . Cholecystectomy  June 2010  . Left heart catheterization with coronary angiogram N/A 09/07/2012    Procedure: LEFT HEART CATHETERIZATION WITH CORONARY ANGIOGRAM;  Surgeon: Troy Sine, MD;  Location: [facility] CATH LAB;  Service: Cardiovascular;  Laterality: N/A;  . Coronary artery bypass graft N/A 09/12/2012    Procedure: OFF PUMP CORONARY ARTERY BYPASS GRAFTING (CABG);  Surgeon: Melrose Nakayama, MD;  Location: [place];  Service: Open Heart Surgery;  Laterality: N/A;  Times two using left internal mammary artery and endoscopically harvested right saphenous vein  ;LIMA-LAD and SVG -OM1  . Cardiac catheterization  2004    had remote tandem stents in 1997 and 1998 for LAD lesions, RCA stent in 1997 , midprto=ion, and a proximal RCA  SENT IN 1999, all DES; some were IVIS  guide.  . Coronary angioplasty    . Cardiac catheterization  09/07/2012    demonstrated severe 2- vessel disease,patent LAD,patient circ and patent right stents, but 90% ostial LAD whic was new.  . Doppler echocardiography  11/23/2012,09/30/2010     LV EF 50-55%----09/30/2010-LV EF = >55%  . Lexiscan stress test  09/06/2012  . Nm myoview ltd  05/06/2012    EF -STUDY NOT GATED, NORMAL STRESS NUCLEAR ,NORMAL LV FUNCTION  . Lower arterial doppler  03/25/2012    FGT CIA 0-49%; RGT CFA AND SFA AT STENT 0-49%; RGT DISTAL SFA = <50%; LFT. EIA  50-69%; LFT. CFA 0-49% ;;LFT. SFA  AT STENT NORMAL PATENCY  . Carotid doppler  100/24/11    RGT BULB and PROXIMAL ICA 0-49%; LFT. ICA  0-49 ; RGT SUBCLAVIAN ARTERY < 50% ; LFT SUBCLAVIAN normal patency; RGT  and LFT  VERTEBRAL ARTERIES  . Event monitor  11/04/2012-11/17/2012    Allergies  Allergen  Reactions  . Metoprolol Other (See Comments)    Fatigue and malaise  . Fish Oil   . Levofloxacin     Unknown reaction  . Meloxicam     Unknown reaction  . Metaxalone     Unknown reaction  . Niaspan [Niacin]   . Sulfonamide Derivatives     unknown reaction - bad rash  . Tricor [Fenofibrate]     Current Outpatient Prescriptions  Medication Sig Dispense Refill  . Albuterol Sulfate (PROAIR RESPICLICK) 267 (90 BASE) MCG/ACT AEPB Inhale 2 puffs into the lungs 4 (four) times daily as needed. 1 each prn  . aspirin EC 81 MG tablet Take 81 mg by mouth daily.    [name] atorvastatin (LIPITOR) 40 MG tablet Take 1 tablet (40 mg total) by mouth daily. *NEED LAB WORK* 30 tablet 0  . cetirizine (ZYRTEC) 10 MG tablet Take 10 mg by mouth daily.    [name]  cholestyramine (QUESTRAN) 4 G packet Take 1 packet by mouth 2 (two) times daily with a meal. 60 each 11  . isosorbide mononitrate (IMDUR) 30 MG 24 hr tablet Take 15 mg by mouth daily.     [name] levothyroxine (SYNTHROID, LEVOTHROID) 50 MCG tablet Take 1 tablet by mouth daily.    [name] losartan (COZAAR) 100 MG tablet TAKE ONE TABLET BY MOUTH EVERY DAY 90 tablet 1  . nabumetone (RELAFEN) 500 MG tablet Take 1 tablet by mouth 2 (two) times daily.    [name] NITROSTAT 0.4 MG SL tablet as needed.    . NON FORMULARY daily. lacithin granules 2 TBSP daily.    . pantoprazole (PROTONIX) 40 MG tablet Take 1 tablet by mouth daily.     No current facility-administered medications for this visit.    History   Social History  . Marital Status: Married    Spouse Name: N/A  . Number of Children: N/A  . Years of Education: N/A   Occupational History  . Not on file.   Social History Main Topics  . Smoking status: Current Every Day Smoker -- 0.50 packs/day for 54 years    Types: Cigarettes  . Smokeless tobacco: Not on file  . Alcohol Use: No  . Drug Use: No  . Sexual Activity: Not on file   Other Topics Concern  . 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 Heis married , is one child and 4 grandchildren.[name]  He continues to work part time in [employer].  His work is physical and often requires lifting.  He had smoked heavily for 54 years. He is now smoking 3-7 cigarettes per day.  He does not drink alcohol or use illicit drugs.  Family History  Problem Relation Age of Onset  . Coronary artery disease Father 54  . Heart attack Father   . Coronary artery disease Brother 12    CABG in 2000    ROS General: Negative; No fevers, chills, or night sweats;  HEENT: Negative; No changes in vision or hearing, sinus congestion, difficulty swallowing Pulmonary: Negative; No cough, wheezing, shortness of breath, hemoptysis Cardiovascular: Negative; No chest pain, presyncope, syncope, palpitations GI: Negative; No nausea, vomiting, diarrhea, or abdominal pain GU: Negative; No dysuria, hematuria, or difficulty voiding Musculoskeletal: Improved range of motion in the left shoulder, but also notes intermittent twinges of pain; no myalgias, or weakness Hematologic/Oncology: Negative; no easy bruising, bleeding Endocrine: Negative; no heat/cold intolerance; no diabetes Neuro: Negative; no changes in balance, headaches Skin: Negative; No rashes or skin lesions Psychiatric: Negative; No behavioral problems, depression Sleep: Negative; No snoring, daytime sleepiness, hypersomnolence, bruxism, restless legs, hypnogognic hallucinations, no cataplexy Other comprehensive 14 point system review is negative.  PE BP 135/76 mmHg  Pulse 76  Ht '5\' 9"'  (1.753 m)  Wt 194 lb 12.8 oz (88.361 kg)  BMI 28.75 kg/m2  General: Alert, oriented, no distress.  Skin: normal turgor, no rashes HEENT: Normocephalic, atraumatic. Pupils round and reactive; sclera anicteric;no lid lag.  Nose without nasal septal hypertrophy Mouth/Parynx benign; Mallinpatti scale 3 Neck: No JVD, no carotid bruits Lungs: Decreased breath sounds without audible wheezing  or rales Chest wall: Nontender to palpation Heart: RRR, s1 s2 normal with a 2/6 systolic murmur at the left sternal border. No diastolic murmur. Abdomen: soft, nontender; no hepatosplenomehaly, BS+; abdominal aorta nontender and not dilated by palpation. Pulses: Slightly decreased pulses at the dorsalis pedis and PT bilaterally. Back:  No CVA tenderness Extremities: no clubbing cyanosis or edema, Homan's sign negative  Neurologic: grossly nonfocal; cranial nerves intact Psychologic: normal affect and mood.  Prior ECG (independently read by me): Normal sinus rhythm at 66 beats per minute.  No ectopy.  PR interval 150 ms.  QTc interval 421 ms  January 2015 ECG (independently read by me): Normal sinus rhythm at 67 beats per minute; normal intervals.  LABS:  BMET  BMP Latest Ref Rng 09/17/2012 09/15/2012 09/14/2012  Glucose 70 - 99 mg/dL 91 125(H) 128(H)  BUN 6 - 23 mg/dL '15 15 16  ' Creatinine 0.50 - 1.35 mg/dL 1.07 0.92 0.98  Sodium 135 - 145 mEq/L 138 134(L) 130(L)  Potassium 3.5 - 5.1 mEq/L 3.8 4.2 4.7  Chloride 96 - 112 mEq/L 100 96 96  CO2 19 - 32 mEq/L '28 27 27  ' Calcium 8.4 - 10.5 mg/dL 8.8 9.0 9.1     Hepatic Function Panel    Hepatic Function Latest Ref Rng 09/05/2012 11/05/2008  Total Protein 6.0 - 8.3 g/dL 6.4 6.2  Albumin 3.5 - 5.2 g/dL 3.2(L) 3.5  AST 0 - 37 U/L 38(H) 21  ALT 0 - 53 U/L 56(H) 27  Alk Phosphatase 39 - 117 U/L 94 84  Total Bilirubin 0.3 - 1.2 mg/dL 0.5 0.6    CBC  CBC Latest Ref Rng 09/17/2012 09/15/2012 09/14/2012  WBC 4.0 - 10.5 K/uL 6.6 8.0 13.3(H)  Hemoglobin 13.0 - 17.0 g/dL 10.2(L) 11.3(L) 11.4(L)  Hematocrit 39.0 - 52.0 % 29.6(L) 33.2(L) 33.6(L)  Platelets 150 - 400 K/uL 197 122(L) 125(L)     BNP No results found for: PROBNP  Lipid Panel     Component Value Date/Time   CHOL 155 09/06/2012 0455   TRIG 179* 09/06/2012 0455   HDL 52 09/06/2012 0455   CHOLHDL 3.0 09/06/2012 0455   VLDL 36 09/06/2012 0455   LDLCALC 67 09/06/2012 0455      RADIOLOGY: No results found.    ASSESSMENT AND PLAN: Brian Brock has established coronary as well as peripheral vascular disease.  He is 2 years status post CABG surgery in April 2014 by Dr. Roxan Hockey. Previously he had undergone prior stenting to his proximal LAD, circumflex coronary artery as well as right coronary artery.  Over the past month he has experienced several episodes of chest pressure and  Diaphoresis worrisome for recurrent angina..  His nuclear perfusion study reveals a significant area of ischemia and a region of scar in the distal LAD territory.  I reviewed this with his old study and showed both the old and the new study to both he and his wife.  He is recently been started on isosorbide mononitrate 15 mg.  I recommended he increase this to 30 mg and if tolerates, he can increase this to 60 mg daily.  I am starting him on very low-dose beta blocker therapy since in the past he did not tolerate higher doses due to significant fatigue.  I recommended definitive cardiac catheterization. The risks and benefits of a cardiac catheterization including, but not limited to, death, stroke, MI, kidney damage and bleeding were discussed with the patient who indicates understanding and agrees to proceed.  The catheterization will be performed week on 07/24/2014 at [facility].  A complete set of blood work will be obtained for the catheterization.  He is on atorvastatin for hyperlipidemia.  I discussed the absolute importance of smoking cessation.  I gave him a prescription for lorazepam to assist with his anxiety with  reference to quitting smoking this week.  Time spent: 40 minutes   Troy Sine, MD, [facility]  07/17/2014 2:44 PM

## 2014-07-24 NOTE — Progress Notes (Signed)
Site area: right groin  Site Prior to Removal:  Level 0  Pressure Applied For 20 MINUTES    Minutes Beginning at 1430  Manual:   Yes.    Patient Status During Pull:  stable  Post Pull Groin Site:  Level 0  Post Pull Instructions Given:  Yes.    Post Pull Pulses Present:  Yes.    Dressing Applied:  Yes.    Comments:

## 2014-07-24 NOTE — CV Procedure (Signed)
Brian Brock is a 69 y.o. male    [id number]  [id number] LOCATION:  FACILITY: [place]  PHYSICIAN: Troy Sine, MD, [facility] 1946-03-27   DATE OF PROCEDURE:  07/24/2014    CARDIAC CATHETERIZATION     HISTORY:    RODDERICK HOLTZER is a 69 y.o. male with known CAD who remotely had undergone prior stenting of his proximal LAD, circumflex, and RCA.  He is 2 years status post CBG surgery in 2014.  He has experienced recurrent episodes of chest pain associated with diaphoresis over the past month.  A recent nuclear perfusion study revealed significant area of new ischemia in the mid distal LAD territory with evidence for  previous  region of scar.  He is referred for definitive cardiac catheterization and possible percutaneous coronary intervention.   PROCEDURE:  Left heart catheterization: coronary angiography of native coronary arteries and selective angiography into the vein graft supplying the circumflex marginal vessel in the LIMA graft, left ventriculography; percutaneous coronary intervention of the ostium and mid LAD with Angiosculpt scoring balloon and DES stenting.  The patient was brought to the [facility] cardiac catherization laboratory in the fasting state. He  was premedicated with Versed 2 mg and fentanyl 50 g initially. His right groin was prepped and shaved in usual sterile fashion. Xylocaine 1% was used for local anesthesia. A 5 French sheath was inserted into the R femoral artery. Diagnostic catheterizatiion was done with 5 [place] FL4, FR4, left bypass graft catheter,AL-1 catheter and pigtail catheters. Left ventriculography was done with 25cc Omnipaque contrast.  With the demonstration of an occluded left internal mammary artery graft which had supplied the LAD and with the 70% ostial LAD stenosis before the proximal LAD stent and 95-99% stenosis after the stent septal perforating artery, the decision was made to perform intervention on the native LAD system.  Angiomax bolus  plus infusion was administered.  The patient received Brilinta 100 mg orally.  He had received aspirin earlier this morning.  Since there was ostial LAD disease  initial predilatation was done with a 2.010 mm Angiosculpt scoring balloon at the mid LAD and ostium of the LAD with multiple inflations at both sites.  This was then upgraded to a 2.515 mm Angiosculpt scoring balloon and dilatation ostially and at the mid LAD was performed.  With each inflation the patient experienced jaw discomfort which was his anginal equivalent.  Since there was diffuse narrowing of 40-50% in the previously placed stent between both lesions the decision was made to place a long stent from the ostium to cover the mid lesion.  A Resolute integrity DES 3.034 mm stent was then inserted and careful attention was made to position this exactly at the LAD ostium which covered the entire proximal LAD and ended beyond the 99% stenosis.  This was deployed at 14 and 15 atm.  An [place] 3.527 mm balloon was used for post stent dilatation up to 3.41 mm in the entire stented segment.  [place] angiography confirmed an excellent angiographic result.  The patient had received several doses of intracardiac nitroglycerin.  He also received additional Versed and fentanyl during the procedure for additional sedation.  The arterial sheath was sutured in place.  Angiomax was discontinued.  He left the catheterization laboratory chest pain-free with stable hemodynamics.  HEMODYNAMICS:   Central Aorta: 135/61   Left Ventricle: 135/7  ANGIOGRAPHY:  Left main: Moderate size vessel which bifurcated into the LAD and left circumflex coronary artery.  LAD: Moderate size  vessel that had 70% ostial stenosis prior to a moderate size proximal branch..  The remotely placed LAD stent arose after this proximal branch and had narrowing of 40 to less than 50%.  Immediately after the proximal LAD stent was a 95-99% LAD stenosis after the septal perforating  artery which had 60% ostial narrowing.  The LAD was otherwise free of significant disease and extended to and wrapped around the apex.  There was no evidence for any competitive filling from the left internal mammary artery.  Left circumflex: 20% ostial narrowing followed by 30% stenosis.  The stent in the proximal circumflex was patent and ended proximal to the AV groove circumflex and marginal bifurcation.   Right coronary artery: Dominant vessel that had a patent ostial proximal stent and a patent mid stent.  The RCA supplied the PDA and ended in a small posterolateral vessel.  LIMA to LAD: The left internal mammary graft was occluded in its midsegment.  There appeared to be a diffuse region of narrowing.  There was no visualization of the LAD from the LIMA graft.  SVG to the obtuse marginal 1 vessel was widely patent.   Left ventriculography revealed low normal LV function with an ejection fraction of 50-55%.  There was small mild distal anterolateral hypocontractility.   IMPRESSION:  Multi vessel coronary artery disease with 70% ostial LAD stenosis, 40-50% narrowing in the proximal LAD stent and 95-99% stenosis after the stented segment in the native LAD with 60% ostial septal perforating artery stenosis; 20th percent ostial 30% proximal circumflex stenosis with widely patent circumflex stent; and widely patent proximal and mid RCA stents.  Occluded LIMA  graft in its midsegment prior to its anastomosis into the LAD.  Widely patent saphenous vein graft supplying the distal circumflex marginal vessel.  Successful percutaneous coronary intervention to the LAD at its ostium and in the midsegment treated with Angiosculpt scoring balloon, insertion of a 3.034 mm resolute stent extending from the ostium to the mid LAD, postdilated to 3.41 mm with the entire region been reduced to 0% and evidence for brisk TIMI-3 flow.  RECOMMENDATION:  The patient should continue on DAPT indefinitely.   Smoking cessation is essential.     Troy Sine, MD, [facility] 07/24/2014 10:44 AM

## 2014-07-24 NOTE — Interval H&P Note (Signed)
Cath Lab Visit (complete for each Cath Lab visit)  Clinical Evaluation Leading to the Procedure:   ACS: No.  Non-ACS:    Anginal Classification: CCS III  Anti-ischemic medical therapy: Maximal Therapy (2 or more classes of medications)  Non-Invasive Test Results: High-risk stress test findings: cardiac mortality >3%/year  Prior CABG: Previous CABG      History and Physical Interval Note:  07/24/2014 8:15 AM  Brian Brock  has presented today for surgery, with the diagnosis of abnormal stress test  The various methods of treatment have been discussed with the patient and family. After consideration of risks, benefits and other options for treatment, the patient has consented to  Procedure(s): LEFT HEART CATHETERIZATION WITH CORONARY/GRAFT ANGIOGRAM (N/A) as a surgical intervention .  The patient's history has been reviewed, patient examined, no change in status, stable for surgery.  I have reviewed the patient's chart and labs.  Questions were answered to the patient's satisfaction.     [name] A

## 2014-07-25 ENCOUNTER — Encounter (HOSPITAL_COMMUNITY): Payer: Self-pay | Admitting: Physician Assistant

## 2014-07-25 DIAGNOSIS — Z955 Presence of coronary angioplasty implant and graft: Secondary | ICD-10-CM | POA: Diagnosis not present

## 2014-07-25 DIAGNOSIS — Z716 Tobacco abuse counseling: Secondary | ICD-10-CM | POA: Diagnosis not present

## 2014-07-25 DIAGNOSIS — I25810 Atherosclerosis of coronary artery bypass graft(s) without angina pectoris: Secondary | ICD-10-CM | POA: Diagnosis not present

## 2014-07-25 DIAGNOSIS — I2510 Atherosclerotic heart disease of native coronary artery without angina pectoris: Secondary | ICD-10-CM | POA: Diagnosis not present

## 2014-07-25 DIAGNOSIS — Z951 Presence of aortocoronary bypass graft: Secondary | ICD-10-CM | POA: Diagnosis not present

## 2014-07-25 LAB — BASIC METABOLIC PANEL
Anion gap: 9 (ref 5–15)
BUN: 15 mg/dL (ref 6–23)
CO2: 23 mmol/L (ref 19–32)
Calcium: 8.7 mg/dL (ref 8.4–10.5)
Chloride: 109 mmol/L (ref 96–112)
Creatinine, Ser: 1.06 mg/dL (ref 0.50–1.35)
GFR calc Af Amer: 81 mL/min — ABNORMAL LOW (ref >90)
GFR calc non Af Amer: 70 mL/min — ABNORMAL LOW (ref >90)
Glucose, Bld: 96 mg/dL (ref 70–99)
Potassium: 3.9 mmol/L (ref 3.5–5.1)
Sodium: 141 mmol/L (ref 135–145)

## 2014-07-25 LAB — CBC
HCT: 32.4 % — ABNORMAL LOW (ref 39.0–52.0)
Hemoglobin: 10.7 g/dL — ABNORMAL LOW (ref 13.0–17.0)
MCH: 27.4 pg (ref 26.0–34.0)
MCHC: 33 g/dL (ref 30.0–36.0)
MCV: 83.1 fL (ref 78.0–100.0)
Platelets: 243 10*3/uL (ref 150–400)
RBC: 3.9 MIL/uL — ABNORMAL LOW (ref 4.22–5.81)
RDW: 15.4 % (ref 11.5–15.5)
WBC: 4.6 10*3/uL (ref 4.0–10.5)

## 2014-07-25 MED ORDER — IBUPROFEN 200 MG PO TABS: 200.0000 mg | ORAL_TABLET | Freq: Four times a day (QID) | ORAL | Status: DC | PRN

## 2014-07-25 MED ORDER — NICOTINE 14 MG/24HR TD PT24: 14.0000 mg | MEDICATED_PATCH | Freq: Every day | TRANSDERMAL | Status: DC

## 2014-07-25 MED ORDER — TICAGRELOR 90 MG PO TABS: 90.0000 mg | ORAL_TABLET | Freq: Two times a day (BID) | ORAL | Status: DC

## 2014-07-25 MED FILL — Fentanyl Citrate Inj 0.05 MG/ML: INTRAMUSCULAR | Qty: 2 | Status: AC

## 2014-07-25 MED FILL — Ticagrelor Tab 90 MG: ORAL | Qty: 2 | Status: AC

## 2014-07-25 MED FILL — Nitroglycerin IV Soln 100 MCG/ML in D5W: INTRA_ARTERIAL | Qty: 10 | Status: AC

## 2014-07-25 MED FILL — Sodium Chloride IV Soln 0.9%: INTRAVENOUS | Qty: 50 | Status: AC

## 2014-07-25 MED FILL — Midazolam HCl Inj 2 MG/2ML (Base Equivalent): INTRAMUSCULAR | Qty: 2 | Status: AC

## 2014-07-25 MED FILL — Lidocaine HCl Local Preservative Free (PF) Inj 1%: INTRAMUSCULAR | Qty: 30 | Status: AC

## 2014-07-25 MED FILL — Bivalirudin For IV Soln 250 MG: INTRAVENOUS | Qty: 250 | Status: AC

## 2014-07-25 MED FILL — Heparin Sodium (Porcine) 2 Unit/ML in Sodium Chloride 0.9%: INTRAMUSCULAR | Qty: 1500 | Status: AC

## 2014-07-25 NOTE — Discharge Instructions (Signed)
PLEASE REMEMBER TO BRING ALL OF YOUR MEDICATIONS TO EACH OF YOUR FOLLOW-UP OFFICE VISITS. ° °PLEASE ATTEND ALL SCHEDULED FOLLOW-UP APPOINTMENTS.  ° °Activity: Increase activity slowly as tolerated. You may shower, but no soaking baths (or swimming) for 1 week. No driving for 2 days. No lifting over 5 lbs for 1 week. No sexual activity for 1 week.  ° °You May Return to Work: in 1 week (if applicable) ° °Wound Care: You may wash cath site gently with soap and water. Keep cath site clean and dry. If you notice pain, swelling, bleeding or pus at your cath site, please call [phone]. ° ° ° °Cardiac Cath Site Care °Refer to this sheet in the next few weeks. These instructions provide you with information on caring for yourself after your procedure. Your caregiver may also give you more specific instructions. Your treatment has been planned according to current medical practices, but problems sometimes occur. Call your caregiver if you have any problems or questions after your procedure. °HOME CARE INSTRUCTIONS °· You may shower 24 hours after the procedure. Remove the bandage (dressing) and gently wash the site with plain soap and water. Gently pat the site dry.  °· Do not apply powder or lotion to the site.  °· Do not sit in a bathtub, swimming pool, or whirlpool for 5 to 7 days.  °· No bending, squatting, or lifting anything over 10 pounds (4.5 kg) as directed by your caregiver.  °· Inspect the site at least twice daily.  °· Do not drive home if you are discharged the same day of the procedure. Have someone else drive you.  °· You may drive 24 hours after the procedure unless otherwise instructed by your caregiver.  °What to expect: °· Any bruising will usually fade within 1 to 2 weeks.  °· Blood that collects in the tissue (hematoma) may be painful to the touch. It should usually decrease in size and tenderness within 1 to 2 weeks.  °SEEK IMMEDIATE MEDICAL CARE IF: °· You have unusual pain at the site or down the  affected limb.  °· You have redness, warmth, swelling, or pain at the site.  °· You have drainage (other than a small amount of blood on the dressing).  °· You have chills.  °· You have a fever or persistent symptoms for more than 72 hours.  °· You have a fever and your symptoms suddenly get worse.  °· Your leg becomes pale, cool, tingly, or numb.  °· You have heavy bleeding from the site. Hold pressure on the site.  °Document Released: 06/06/2010 Document Revised: 04/23/2011 Document Reviewed: 06/06/2010 °Exit[employer]® Patient Information ©2012 [employer]. ° °

## 2014-07-25 NOTE — Progress Notes (Addendum)
CARDIAC REHAB PHASE I   PRE:  Rate/Rhythm: 78 SR  BP:  Supine:   Sitting:   Standing: 153/66   SaO2:   MODE:  Ambulation: 800 ft   POST:  Rate/Rhythm: 93 SR  BP:  Supine:   Sitting: 176/61  Standing:    SaO2:  0805-0903 Pt walked 800 ft with steady gait. No CP. C/o slight SOB. Education completed with pt and wife who voiced understanding. Gave pt fake cigarette and smoking cessation handout and encouraged pt to quit. Gave heart healthy diet guidelines. Reviewed NTG use. Discussed CRP 2 but pt declined. Stated he worked with PT after CABG and would like to discuss with cardiologist returning to that. Stressed importance of brilinta with stent. He has brilinta card.  Graylon Good, RN BSN  07/25/2014 8:57 AM

## 2014-07-25 NOTE — Discharge Summary (Signed)
CARDIOLOGY DISCHARGE SUMMARY   Patient ID: Brian Brock MRN: [id number] DOB/AGE: 16-Dec-1945 69 y.o.  Admit date: 07/24/2014 Discharge date: 07/25/2014  PCP: [name], MD Primary Cardiologist: Dr. Claiborne Billings  Primary Discharge Diagnosis:  Coronary artery disease involving coronary bypass graft with other forms of angina pectoris  Secondary Discharge Diagnosis:      CAD (coronary artery disease) of artery bypass graft     Anemia  Procedures: Left heart catheterization: coronary angiography of native coronary arteries and selective angiography into the vein graft supplying the circumflex marginal vessel in the LIMA graft, left ventriculography; percutaneous coronary intervention of the ostium and mid LAD with Angiosculpt scoring balloon and DES stenting.  Hospital Course: Brian Brock is a 69 y.o. male with a long history of CAD. He had some episodes of chest pain and a nuclear perfusion study was high risk. He was scheduled for cardiac catheterization and came to the hospital for the procedure on 07/24/2014.  Cardiac catheterization results are below. He had percutaneous intervention to the LAD with angina skull balloon and drug-eluting stent. He tolerated the procedure well.  He had problems with hypertension during the night and some shortness of breath possibly secondary to Brilinta, but his symptoms improved with medical therapy.   There was some confusion regarding his beta blocker dosing but the patient states that he is taking the metoprolol for now, and we'll go back on the by systolic once it becomes generic.  On 07/25/2014, he was seen by Dr. Claiborne Billings and all data were reviewed. He was anemic postprocedure, but this is felt secondary to the procedure. His hemoglobin and hematocrit were at similar levels in 2014. He is having no obvious bleeding problems and his platelet count is normal.  His shortness of breath had improved and his vital signs were stable. He was  counseled on smoking cessation and agrees to try nicotine patch. He was seen by cardiac rehabilitation and educated on heart-healthy lifestyle modifications, stent restrictions and exercise guidelines.  No further inpatient workup is indicated and he is considered stable for discharge, to follow up as an outpatient.  BP 124/54 mmHg  Pulse 64  Temp(Src) 97.9 F (36.6 C) (Oral)  Resp 18  Ht 5\' 9"  (1.753 m)  Wt 191 lb 12.8 oz (87 kg)  BMI 28.31 kg/m2  SpO2 96% General: Well developed, well nourished, male in no acute distress Head: Eyes PERRLA, No xanthomas.   Normocephalic and atraumatic  Lungs: Rales bilateral bases Heart: HRRR S1 S2, without MRG.  Pulses are 2+ & equal. No carotid bruit. No JVD.  Abdomen: Bowel sounds are present, abdomen soft and non-tender without masses or  hernias noted. Msk: Normal strength and tone for age. Extremities: No clubbing, cyanosis or edema.  Right groin cath site with minimal ecchymosis, no hematoma. Bilateral femoral bruits are noted Skin:  No rashes or lesions noted. Neuro: Alert and oriented X 3. Psych:  Good affect, responds appropriately   Labs:  Lab Results  Component Value Date   WBC 4.6 07/25/2014   HGB 10.7* 07/25/2014   HCT 32.4* 07/25/2014   MCV 83.1 07/25/2014   PLT 243 07/25/2014     Recent Labs Lab 07/25/14 0507  NA 141  K 3.9  CL 109  CO2 23  BUN 15  CREATININE 1.06  CALCIUM 8.7  GLUCOSE 96    Recent Labs  07/24/14 1300  TROPONINI 0.03      Radiology: Dg Chest 2 View 07/18/2014  CLINICAL DATA:  Initial encounter for abnormal stressed test with 2 week history of chest pain.  EXAM: CHEST  2 VIEW  COMPARISON:  07/07/2013.  FINDINGS: Stable appearance of biapical pleural-parenchymal scarring. The lungs are clear without focal infiltrate, edema, pneumothorax or pleural effusion. Interstitial markings are diffusely coarsened with chronic features. The cardiopericardial silhouette is within normal limits for size. Patient  is status post CABG. Imaged bony structures of the thorax are intact.  IMPRESSION: Stable.  No acute findings.   Electronically Signed   By: Misty Stanley M.D.   On: 07/18/2014 11:49    Cardiac Cath: 07/25/2014 Left main: Moderate size vessel which bifurcated into the LAD and left circumflex coronary artery. LAD: Moderate size vessel that had 70% ostial stenosis prior to a moderate size proximal branch.. The remotely placed LAD stent arose after this proximal branch and had narrowing of 40 to less than 50%. Immediately after the proximal LAD stent was a 95-99% LAD stenosis after the septal perforating artery which had 60% ostial narrowing. The LAD was otherwise free of significant disease and extended to and wrapped around the apex. There was no evidence for any competitive filling from the left internal mammary artery. Left circumflex: 20% ostial narrowing followed by 30% stenosis. The stent in the proximal circumflex was patent and ended proximal to the AV groove circumflex and marginal bifurcation.  Right coronary artery: Dominant vessel that had a patent ostial proximal stent and a patent mid stent. The RCA supplied the PDA and ended in a small posterolateral vessel. LIMA to LAD: The left internal mammary graft was occluded in its midsegment. There appeared to be a diffuse region of narrowing. There was no visualization of the LAD from the LIMA graft. SVG to the obtuse marginal 1 vessel was widely patent. Left ventriculography revealed low normal LV function with an ejection fraction of 50-55%. There was small mild distal anterolateral hypocontractility. IMPRESSION: Multi vessel coronary artery disease with 70% ostial LAD stenosis, 40-50% narrowing in the proximal LAD stent and 95-99% stenosis after the stented segment in the native LAD with 60% ostial septal perforating artery stenosis; 20th percent ostial 30% proximal circumflex stenosis with widely patent circumflex stent; and widely  patent proximal and mid RCA stents. Occluded LIMA graft in its midsegment prior to its anastomosis into the LAD. Widely patent saphenous vein graft supplying the distal circumflex marginal vessel. Successful percutaneous coronary intervention to the LAD at its ostium and in the midsegment treated with Angiosculpt scoring balloon, insertion of a 3.034 mm resolute stent extending from the ostium to the mid LAD, postdilated to 3.41 mm with the entire region been reduced to 0% and evidence for brisk TIMI-3 flow. RECOMMENDATION: The patient should continue on DAPT indefinitely. Smoking cessation is essential.  EKG: 07/25/2014 Sinus rhythm Inferior Q waves and lateral T-wave abnormalities  FOLLOW UP PLANS AND APPOINTMENTS Allergies  Allergen Reactions  . Metoprolol Other (See Comments)    Fatigue and malaise  . Fish Oil   . Levofloxacin     Unknown reaction  . Meloxicam     Unknown reaction  . Metaxalone     Unknown reaction  . Niaspan [Niacin]   . Sulfonamide Derivatives     unknown reaction - bad rash  . Tricor [Fenofibrate]      Medication List    TAKE these medications        Albuterol Sulfate 108 (90 BASE) MCG/ACT Aepb  Commonly known as:  PROAIR RESPICLICK  Inhale 2 puffs into  the lungs 4 (four) times daily as needed.     aspirin EC 81 MG tablet  Take 81 mg by mouth daily.     atorvastatin 40 MG tablet  Commonly known as:  LIPITOR  TAKE ONE TABLET BY MOUTH EVERY DAY     cetirizine 10 MG tablet  Commonly known as:  ZYRTEC  Take 10 mg by mouth daily.     cholestyramine 4 G packet  Commonly known as:  QUESTRAN  Take 1 packet by mouth 2 (two) times daily with a meal.     cyclobenzaprine 10 MG tablet  Commonly known as:  FLEXERIL  Take 10 mg by mouth 3 (three) times daily as needed for muscle spasms.     ibuprofen 200 MG tablet  Commonly known as:  ADVIL,MOTRIN  Take 1 tablet (200 mg total) by mouth every 6 (six) hours as needed (pain).     isosorbide  mononitrate 30 MG 24 hr tablet  Commonly known as:  IMDUR  Take 30 mg by mouth daily.     levothyroxine 50 MCG tablet  Commonly known as:  SYNTHROID, LEVOTHROID  Take 1 tablet by mouth daily.     LORazepam 0.5 MG tablet  Commonly known as:  ATIVAN  Prn. Not to exceed 2 in 24 hours     losartan 100 MG tablet  Commonly known as:  COZAAR  TAKE ONE TABLET BY MOUTH EVERY DAY     metoprolol tartrate 25 MG tablet  Commonly known as:  LOPRESSOR  Take 1 tablet (25 mg total) by mouth 2 (two) times daily.     nicotine 14 mg/24hr patch  Commonly known as:  NICODERM CQ - dosed in mg/24 hours  Place 1 patch (14 mg total) onto the skin daily.     NITROSTAT 0.4 MG SL tablet  Generic drug:  nitroGLYCERIN  Place 0.4 mg under the tongue every 5 (five) minutes as needed for chest pain.     NON FORMULARY  daily. lacithin granules 2 TBSP daily.     pantoprazole 40 MG tablet  Commonly known as:  PROTONIX  Take 1 tablet by mouth daily.     ticagrelor 90 MG Tabs tablet  Commonly known as:  BRILINTA  Take 1 tablet (90 mg total) by mouth 2 (two) times daily.     VITAMIN B-12 PO  Take 1 tablet by mouth daily.         Follow-up Information    Follow up with Troy Sine, MD.   Specialty:  Cardiology   Why:  The office will call.   Contact information:   [address] [place] [address] [phone]       BRING ALL MEDICATIONS WITH YOU TO FOLLOW UP APPOINTMENTS  Time spent with patient to include physician time: 46 min Signed: Rosaria Ferries, PA-C 07/25/2014, 8:15 AM Co-Sign MD   Patient seen and examined. Agree with assessment and plan. Feels well. S/P Angiosculpt and DES stenting of native LAD from ostiom ti mid due to occluded LIMA graft. Discussed smoking cessation. Continue brilinta/81 mg ASA. Had mild dyspnea yesterday. Discussed 14% potential for mild dyspnea with brilinta which usually improves. Ambulate this am and DC.    Troy Sine, MD,  [facility] 07/25/2014 8:23 AM

## 2014-07-26 NOTE — Care Management Note (Signed)
    Page 1 of 1   07/26/2014     3:31:04 PM CARE MANAGEMENT NOTE 07/26/2014  Patient:  Brian Brock, Brian Brock   Account Number:  [id number]  Date Initiated:  07/25/2014  Documentation initiated by:  Kaitlyne Friedhoff  Subjective/Objective Assessment:   Pt s/p PCI on 07/24/14.     Action/Plan:   Pt to dc on Brilinta.   Anticipated DC Date:  07/25/2014   Anticipated DC Plan:  [place]  CM consult  Medication Assistance      Choice offered to / List presented to:             Status of service:  Completed, signed off Medicare Important Message given?  NO (If response is "NO", the following Medicare IM given date fields will be blank) Date Medicare IM given:   Medicare IM given by:   Date Additional Medicare IM given:   Additional Medicare IM given by:    Discharge Disposition:  HOME/SELF CARE  Per UR Regulation:  Reviewed for med. necessity/level of care/duration of stay  If discussed at [place] of Stay Meetings, dates discussed:    Comments:  07/25/14 Ellan Lambert, RN, BSN [phone]  PT COPAY WILL BE $47- [place] NOT REQUIRED

## 2014-07-27 ENCOUNTER — Telehealth: Payer: Self-pay | Admitting: Nurse Practitioner

## 2014-07-27 ENCOUNTER — Telehealth: Payer: Self-pay | Admitting: Cardiovascular Disease

## 2014-07-27 NOTE — Telephone Encounter (Signed)
New message      TCM appt on 08-01-14 per [facility].

## 2014-07-27 NOTE — Telephone Encounter (Signed)
Patient contacted regarding discharge from [facility] on 07/25/2014  Patient understands to follow up with provider Kathrene Alu on 08/01/14 at 9:30AM at [facility]. Patient understands discharge instructions? yes Patient understands medications and regiment? yes Patient understands to bring all medications to this visit? yes  Pt did not understand why his appointment was scheduled at the [facility] with Kathrene Alu since he was a patient of Dr Claiborne Billings at [place].   I tried to explain, pt advised to call [facility] if he had further questions.

## 2014-07-27 NOTE — Telephone Encounter (Signed)
Closed encounter °

## 2014-08-01 ENCOUNTER — Encounter: Payer: Self-pay | Admitting: Nurse Practitioner

## 2014-08-01 ENCOUNTER — Ambulatory Visit (INDEPENDENT_AMBULATORY_CARE_PROVIDER_SITE_OTHER): Payer: Medicare Other | Admitting: Nurse Practitioner

## 2014-08-01 VITALS — BP 110/68 | HR 54 | Ht 69.0 in | Wt 195.8 lb

## 2014-08-01 DIAGNOSIS — Z955 Presence of coronary angioplasty implant and graft: Secondary | ICD-10-CM

## 2014-08-01 DIAGNOSIS — R05 Cough: Secondary | ICD-10-CM

## 2014-08-01 DIAGNOSIS — R0600 Dyspnea, unspecified: Secondary | ICD-10-CM | POA: Diagnosis not present

## 2014-08-01 DIAGNOSIS — R059 Cough, unspecified: Secondary | ICD-10-CM

## 2014-08-01 LAB — BASIC METABOLIC PANEL
BUN: 23 mg/dL (ref 6–23)
CO2: 28 mEq/L (ref 19–32)
Calcium: 9.2 mg/dL (ref 8.4–10.5)
Chloride: 106 mEq/L (ref 96–112)
Creatinine, Ser: 1.15 mg/dL (ref 0.40–1.50)
GFR: 67.09 mL/min (ref >60.00)
Glucose, Bld: 90 mg/dL (ref 70–99)
Potassium: 4.2 mEq/L (ref 3.5–5.1)
Sodium: 138 mEq/L (ref 135–145)

## 2014-08-01 LAB — CBC
HCT: 40.6 % (ref 39.0–52.0)
Hemoglobin: 13.9 g/dL (ref 13.0–17.0)
MCHC: 34.2 g/dL (ref 30.0–36.0)
MCV: 83.8 fl (ref 78.0–100.0)
Platelets: 246 10*3/uL (ref 150.0–400.0)
RBC: 4.85 Mil/uL (ref 4.22–5.81)
RDW: 13.4 % (ref 11.5–15.5)
WBC: 10.9 10*3/uL — ABNORMAL HIGH (ref 4.0–10.5)

## 2014-08-01 MED ORDER — PRASUGREL HCL 10 MG PO TABS: 10.0000 mg | ORAL_TABLET | Freq: Every day | ORAL | Status: DC

## 2014-08-01 NOTE — Progress Notes (Signed)
CARDIOLOGY OFFICE NOTE  Date:  08/01/2014    [name] Date of Birth: 1946-01-12 Medical Record #[id number]  PCP:  [name], MD  Cardiologist:  Claiborne Billings    Chief Complaint  Patient presents with  . Coronary Artery Disease    Post PCI/TOC visit - seen for Dr. Claiborne Billings    History of Present Illness: Brian Brock is a 69 y.o. male who presents today for a post hospital/TOC visit. Seen for Dr. Claiborne Billings. He is a former patient of Dr. Terance Ice.  Mr. Trueheart has known CAD and has a history of hypertension, hyperlipidemia, hypothyroidism, peripheral vascular disease,probable metabolic syndrome and ongoing tobacco use. He has undergone prior percutaneous coronary interventions with stenting to his LAD and RCA in 1997, 1998, and in 2004 to his proximal left circumflex coronary artery. He also has established PVD and underwent stenting to his left SFA in 2004 and right SFA in 2008. In April 2014 he presented to [facility] with chest pain and T wave abnormalities. A nuclear perfusion study demonstrated reversible ischemia in the apical segment of the anterior wall. He underwent repeat cardiac catheterization on 09/07/2012 by me and was found to have preserved LV function with minimal focal region of mild inferior hypocontractility. He had significant progression of CAD with ostial 90% eccentric stenosis of the LAD extending into the distal left main proximal to a previously placed proximal LAD stent. There also is 90% stenosis of the LAD after the stented segment. The circumflex vessel is 60% ostial stenosis followed by 60% proximal stenosis prior to the previously placed stent. The RCA stent was patent. CABG surgery was recommended and this was done by Dr. Roxan Hockey on 09/12/2012 with an off-pump LIMA to LAD and SVG to circumflex marginal 1 vessel.  He was seen last month with severe chest tightness with shortness of breath and blood pressure elevation to 433  systolically. He was seen by [name] on 07/06/2014 after he had again developed some chest pain while moving 40 pounds of bags of salt that he had put in a wheelbarrow. He was referred for a nuclear perfusion study which was done on 07/13/2014. This was significantly changed from his prior study and now was felt to be high risk with a large severe partially reversible anterior apical defect consistent with prior apical infarct, but now with evidence for severe ischemia in the distal anterior wall and apex. There was apical hypokinesis. Ejection fraction was 52%. He was started on Imdur 15 mg and referred on for cardiac cath. He had continued to smoke.  He was started on Brilinta, switched from Coreg to Lopressor and put on Imdur.   Comes back today. Here alone. Doing ok. Brings in a form to return to work. No chest pain. He has stopped smoking for the past 9 days. He is a little short of breath but this seems better. He notes that he has had a cough however - says he is coughing "all the time" - feels like it is related to his medicine. Cath site ok. Not dizzy or lightheaded. Asking about returning to work. 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   Past Medical History  Diagnosis Date  . Coronary artery disease     Multiple stents  . COPD (chronic obstructive pulmonary disease)   . Peripheral vascular disease     prior stenting  . Dyslipidemia   . Hyperlipemia   . Allergic rhinitis   . Hypertension   .  Myocardial infarct 1997    "mild"  . Anginal pain 1997  . Childhood asthma   . Chronic bronchitis     Yearly  . Hypothyroidism   . GERD (gastroesophageal reflux disease)   . Arthritis     Back    Past Surgical History  Procedure Laterality Date  . Coronary angioplasty with stent placement  '97, '98, '04, '06, '08, '10    seven cardiac stents  . Femoral artery stent Bilateral 7/06  8/08    Lt SFA '06, Rt SFA 8/08  . Laparoscopic cholecystectomy  June 2010  . Left  heart catheterization with coronary angiogram N/A 09/07/2012    Procedure: LEFT HEART CATHETERIZATION WITH CORONARY ANGIOGRAM;  Surgeon: Troy Sine, MD;  Location: [facility] CATH LAB;  Service: Cardiovascular;  Laterality: N/A;  . Coronary artery bypass graft N/A 09/12/2012    Procedure: OFF PUMP CORONARY ARTERY BYPASS GRAFTING (CABG);  Surgeon: Melrose Nakayama, MD;  Location: [facility];  Service: Open Heart Surgery;  Laterality: N/A;  Times two using left internal mammary artery and endoscopically harvested right saphenous vein  ;LIMA-LAD and SVG -OM1  . Doppler echocardiography  11/23/2012,09/30/2010     LV EF 50-55%----09/30/2010-LV EF = >55%  . Lexiscan stress test  09/06/2012  . Nm myoview ltd  05/06/2012    EF -STUDY NOT GATED, NORMAL STRESS NUCLEAR ,NORMAL LV FUNCTION  . Lower arterial doppler  03/25/2012    FGT CIA 0-49%; RGT CFA AND SFA AT STENT 0-49%; RGT DISTAL SFA = <50%; LFT. EIA  50-69%; LFT. CFA 0-49% ;;LFT. SFA  AT STENT NORMAL PATENCY  . Carotid doppler  100/24/11    RGT BULB and PROXIMAL ICA 0-49%; LFT. ICA  0-49 ; RGT SUBCLAVIAN ARTERY < 50% ; LFT SUBCLAVIAN normal patency; RGT  and LFT  VERTEBRAL ARTERIES  . Event monitor  11/04/2012-11/17/2012    pt denies this hx on 07/24/2014  . Tonsillectomy  1950's  . Hernia repair  10/2008    "repaired w/gallbladder OR"  . Cardiac catheterization  2004    had remote tandem stents in 1997 and 1998 for LAD lesions, RCA stent in 1997 , midprto=ion, and a proximal RCA  SENT IN 1999, all DES; some were IVIS  guide.  . Coronary angioplasty    . Cardiac catheterization  09/07/2012    demonstrated severe 2- vessel disease,patent LAD,patient circ and patent right stents, but 90% ostial LAD whic was new.  . Left heart catheterization with coronary/graft angiogram N/A 07/24/2014    Procedure: LEFT HEART CATHETERIZATION WITH Beatrix Fetters;  Surgeon: Troy Sine, MD; LAD 70%, 50% ISR, mLAD 99%, CFX 30%, patent stent, RCA patent, LIMA-LAD  occluded, SVG-OM 1 patent, EF 50-55%  . Percutaneous coronary stent intervention (pci-s) Right 07/24/2014    Procedure: PERCUTANEOUS CORONARY STENT INTERVENTION (PCI-S);  Surgeon: Troy Sine, MD; ostial LAD PTCA with angioscope scoring balloon, 3.0 x 34 mm Resolute DES extending from the ostium to the mid LAD, covering all significant stenoses      Medications: Current Outpatient Prescriptions  Medication Sig Dispense Refill  . Albuterol Sulfate (PROAIR RESPICLICK) 099 (90 BASE) MCG/ACT AEPB Inhale 2 puffs into the lungs 4 (four) times daily as needed. 1 each prn  . aspirin EC 81 MG tablet Take 81 mg by mouth daily.    [name] atorvastatin (LIPITOR) 40 MG tablet TAKE ONE TABLET BY MOUTH EVERY DAY 30 tablet 1  . cetirizine (ZYRTEC) 10 MG tablet Take 10 mg by mouth daily.    [name]  cholestyramine (QUESTRAN) 4 G packet Take 1 packet by mouth 2 (two) times daily with a meal. 60 each 11  . Cyanocobalamin (VITAMIN B-12 PO) Take 1 tablet by mouth daily.    . cyclobenzaprine (FLEXERIL) 10 MG tablet Take 10 mg by mouth 3 (three) times daily as needed for muscle spasms.     . isosorbide mononitrate (IMDUR) 30 MG 24 hr tablet Take 15 mg by mouth daily.     [name] levothyroxine (SYNTHROID, LEVOTHROID) 25 MCG tablet Take 25 mcg by mouth daily before breakfast.     . LORazepam (ATIVAN) 0.5 MG tablet Prn. Not to exceed 2 in 24 hours (Patient taking differently: Take 0.5 mg by mouth 2 (two) times daily as needed for anxiety. Prn. Not to exceed 2 in 24 hours) 30 tablet 0  . losartan (COZAAR) 100 MG tablet TAKE ONE TABLET BY MOUTH EVERY DAY 90 tablet 1  . metoprolol tartrate (LOPRESSOR) 25 MG tablet Take 1 tablet (25 mg total) by mouth 2 (two) times daily. (Patient taking differently: Take 12.5 mg by mouth 2 (two) times daily. ) 60 tablet 6  . NITROSTAT 0.4 MG SL tablet Place 0.4 mg under the tongue every 5 (five) minutes as needed for chest pain.     . pantoprazole (PROTONIX) 40 MG tablet Take 1 tablet by mouth 2 (two) times  daily.     . NON FORMULARY daily. lacithin granules 2 TBSP daily.    . prasugrel (EFFIENT) 10 MG TABS tablet Take 1 tablet (10 mg total) by mouth daily. 90 tablet 3   No current facility-administered medications for this visit.    Allergies: Allergies  Allergen Reactions  . Metoprolol Other (See Comments)    Fatigue and malaise  . Fish Oil   . Levofloxacin     Unknown reaction  . Meloxicam     Unknown reaction  . Metaxalone     Unknown reaction  . Niaspan [Niacin]   . Sulfonamide Derivatives     unknown reaction - bad rash  . Tricor [Fenofibrate]     Social History: The patient  reports that he quit smoking 8 days ago. His smoking use included Cigarettes. He has a 14 pack-year smoking history. He has quit using smokeless tobacco. His smokeless tobacco use included Chew. He reports that he drinks about 4.2 oz of alcohol per week. He reports that he does not use illicit drugs.   Family History: The patient's family history includes Coronary artery disease (age of onset: 62) in his father; Coronary artery disease (age of onset: 39) in his brother; Heart attack in his father.   Review of Systems: Please see the history of present illness.   Otherwise, the review of systems is positive for shortness of breath, cough, muscle pain and headaches.   All other systems are reviewed and negative.   Physical Exam: VS:  BP 110/68 mmHg  Pulse 54  Ht 5\' 9"  (1.753 m)  Wt 195 lb 12.8 oz (88.814 kg)  BMI 28.90 kg/m2  SpO2 98% .  BMI Body mass index is 28.9 kg/(m^2).  Wt Readings from Last 3 Encounters:  08/01/14 195 lb 12.8 oz (88.814 kg)  07/25/14 191 lb 12.8 oz (87 kg)  07/17/14 194 lb 12.8 oz (88.361 kg)    General: Pleasant. Well developed, well nourished and in no acute distress.  HEENT: Normal. Neck: Supple, no JVD, carotid bruits, or masses noted.  Cardiac: Regular rate and rhythm. No murmurs, rubs, or gallops. No edema.  Respiratory:  Lungs are decreased bilaterally with  normal work of breathing. He has a strong cough. GI: Soft and nontender.  MS: No deformity or atrophy. Gait and ROM intact. Skin: Warm and dry. Color is normal.  Neuro:  Strength and sensation are intact and no gross focal deficits noted.  Psych: Alert, appropriate and with normal affect.   LABORATORY DATA:  EKG:  EKG is not ordered today.   Lab Results  Component Value Date   WBC 4.6 07/25/2014   HGB 10.7* 07/25/2014   HCT 32.4* 07/25/2014   PLT 243 07/25/2014   GLUCOSE 96 07/25/2014   CHOL 155 09/06/2012   TRIG 179* 09/06/2012   HDL 52 09/06/2012   LDLCALC 67 09/06/2012   ALT 21 07/17/2014   AST 22 07/17/2014   NA 141 07/25/2014   K 3.9 07/25/2014   CL 109 07/25/2014   CREATININE 1.06 07/25/2014   BUN 15 07/25/2014   CO2 23 07/25/2014   TSH 3.875 07/17/2014   INR 0.94 07/17/2014   HGBA1C 6.5* 09/05/2012    BNP (last 3 results) No results for input(s): BNP in the last 8760 hours.  ProBNP (last 3 results) No results for input(s): PROBNP in the last 8760 hours.   Other Studies Reviewed Today:  ANGIOGRAPHY:  Left main: Moderate size vessel which bifurcated into the LAD and left circumflex coronary artery.  LAD: Moderate size vessel that had 70% ostial stenosis prior to a moderate size proximal branch.. The remotely placed LAD stent arose after this proximal branch and had narrowing of 40 to less than 50%. Immediately after the proximal LAD stent was a 95-99% LAD stenosis after the septal perforating artery which had 60% ostial narrowing. The LAD was otherwise free of significant disease and extended to and wrapped around the apex. There was no evidence for any competitive filling from the left internal mammary artery.  Left circumflex: 20% ostial narrowing followed by 30% stenosis. The stent in the proximal circumflex was patent and ended proximal to the AV groove circumflex and marginal bifurcation.   Right coronary artery: Dominant vessel that had a patent  ostial proximal stent and a patent mid stent. The RCA supplied the PDA and ended in a small posterolateral vessel.  LIMA to LAD: The left internal mammary graft was occluded in its midsegment. There appeared to be a diffuse region of narrowing. There was no visualization of the LAD from the LIMA graft.  SVG to the obtuse marginal 1 vessel was widely patent.   Left ventriculography revealed low normal LV function with an ejection fraction of 50-55%. There was small mild distal anterolateral hypocontractility.   IMPRESSION:  Multi vessel coronary artery disease with 70% ostial LAD stenosis, 40-50% narrowing in the proximal LAD stent and 95-99% stenosis after the stented segment in the native LAD with 60% ostial septal perforating artery stenosis; 20th percent ostial 30% proximal circumflex stenosis with widely patent circumflex stent; and widely patent proximal and mid RCA stents.  Occluded LIMA graft in its midsegment prior to its anastomosis into the LAD.  Widely patent saphenous vein graft supplying the distal circumflex marginal vessel.  Successful percutaneous coronary intervention to the LAD at its ostium and in the midsegment treated with Angiosculpt scoring balloon, insertion of a 3.034 mm resolute stent extending from the ostium to the mid LAD, postdilated to 3.41 mm with the entire region been reduced to 0% and evidence for brisk TIMI-3 flow.  RECOMMENDATION:  The patient should continue on DAPT indefinitely. Smoking cessation is  essential.  Troy Sine, MD, [facility] 07/24/2014 10:44 AM   Echo Study Conclusions from 2014  - Left ventricle: The cavity size was normal. Wall thickness was normal. Systolic function was normal. The estimated ejection fraction was in the range of 50% to 55%. Possible mild hypokinesis of the anteroseptal myocardium. Left ventricular diastolic function parameters were normal. - Mitral valve: Valve area by pressure half-time: 2cm^2. -  Atrial septum: No defect or patent foramen ovale was identified.  Assessment/Plan: 1. Extensive CAD - prior CABG and multiple procedures - most recently with percutaneous coronary intervention to the LAD at its ostium and in the midsegment treated with Angiosculpt scoring balloon, insertion of a 3.034 mm resolute stent extending from the ostium to the mid LAD - he is to be on DAPT indefinitely. Little anemic post procedure - needs recheck of his labs today. See Dr. Claiborne Billings back in 6 weeks.   2. Extensive PAD  3. Ongoing tobacco abuse -  Says he has stopped. Hopefully this will be long term.  4. HTN - BP ok  5. HLD -  On statin  6. Cough - I doubt this is Brilinta but will change him to Effient - samples given - he will probably not be able to afford long term. If his cough persists, he will need to see PCP.   Current medicines are reviewed with the patient today.  The patient does not have concerns regarding medicines other than what has been noted above.  The following changes have been made:  See above.  Labs/ tests ordered today include:    Orders Placed This Encounter  Procedures  . Basic metabolic panel  . CBC     Disposition:   FU with Dr. Claiborne Billings in 6 weeks  Patient is agreeable to this plan and will call if any problems develop in the interim.   Signed: Burtis Junes, RN, ANP-C 08/01/2014 10:43 AM  [facility] [address] [place], [place]  [address] Phone: [phone] Fax: [phone]

## 2014-08-01 NOTE — Patient Instructions (Signed)
We will be checking the following labs today BMET and CBC  Stop Brilinta and start Effient 10 mg a day - we will try to give you samples - this has been sent to your pharmacy  Stay on your other medicines  See Dr. Claiborne Billings in 6 weeks  Ok to return to work on March 28  Ok to resume your regular activities  If your cough persists - see your primary care doctor  Congrats for not smoking!!!  Call the [place] office at [phone] if you have any questions, problems or concerns.

## 2014-08-07 DIAGNOSIS — K219 Gastro-esophageal reflux disease without esophagitis: Secondary | ICD-10-CM | POA: Diagnosis not present

## 2014-08-07 DIAGNOSIS — J40 Bronchitis, not specified as acute or chronic: Secondary | ICD-10-CM | POA: Diagnosis not present

## 2014-08-07 DIAGNOSIS — J0100 Acute maxillary sinusitis, unspecified: Secondary | ICD-10-CM | POA: Diagnosis not present

## 2014-09-17 ENCOUNTER — Other Ambulatory Visit: Payer: Self-pay | Admitting: Cardiovascular Disease

## 2014-09-17 NOTE — Telephone Encounter (Signed)
Rx(s) sent to pharmacy electronically.  

## 2014-09-18 ENCOUNTER — Ambulatory Visit (INDEPENDENT_AMBULATORY_CARE_PROVIDER_SITE_OTHER): Payer: Medicare Other | Admitting: Cardiovascular Disease

## 2014-09-18 ENCOUNTER — Encounter: Payer: Self-pay | Admitting: Cardiovascular Disease

## 2014-09-18 VITALS — BP 150/70 | HR 53 | Ht 69.0 in | Wt 200.0 lb

## 2014-09-18 DIAGNOSIS — I10 Essential (primary) hypertension: Secondary | ICD-10-CM

## 2014-09-18 DIAGNOSIS — I739 Peripheral vascular disease, unspecified: Secondary | ICD-10-CM | POA: Diagnosis not present

## 2014-09-18 DIAGNOSIS — R001 Bradycardia, unspecified: Secondary | ICD-10-CM | POA: Diagnosis not present

## 2014-09-18 DIAGNOSIS — Z720 Tobacco use: Secondary | ICD-10-CM

## 2014-09-18 DIAGNOSIS — I25810 Atherosclerosis of coronary artery bypass graft(s) without angina pectoris: Secondary | ICD-10-CM

## 2014-09-18 DIAGNOSIS — I2510 Atherosclerotic heart disease of native coronary artery without angina pectoris: Secondary | ICD-10-CM

## 2014-09-18 DIAGNOSIS — I200 Unstable angina: Secondary | ICD-10-CM

## 2014-09-18 DIAGNOSIS — E8881 Metabolic syndrome: Secondary | ICD-10-CM

## 2014-09-18 DIAGNOSIS — F17200 Nicotine dependence, unspecified, uncomplicated: Secondary | ICD-10-CM

## 2014-09-18 MED ORDER — LOSARTAN POTASSIUM 100 MG PO TABS
100.0000 mg | ORAL_TABLET | Freq: Every day | ORAL | Status: DC
Start: 2014-09-18 — End: 2015-07-26

## 2014-09-18 MED ORDER — ATORVASTATIN CALCIUM 40 MG PO TABS: 40.0000 mg | ORAL_TABLET | Freq: Every day | ORAL | Status: DC

## 2014-09-18 MED ORDER — NEBIVOLOL HCL 5 MG PO TABS: 5.0000 mg | ORAL_TABLET | Freq: Every day | ORAL | Status: DC

## 2014-09-18 NOTE — Patient Instructions (Signed)
Your physician has recommended you make the following change in your medication: start new prescription for Bystolic 5 mg. Start out with 1/2 tablet daily. If tolerating increase to 1 tablet daily.  Your physician recommends that you schedule a follow-up appointment in: 4 months.

## 2014-09-19 ENCOUNTER — Encounter: Payer: Self-pay | Admitting: Cardiovascular Disease

## 2014-09-19 NOTE — Progress Notes (Signed)
Patient ID: [name], male   DOB: [birth year], 69 y.o.   MRN: [id number]     HPI: ROMANO STIGGER is a 69 y.o. male who is a former patient of Dr. Terance Ice. He had established cardiology care with me in October 2014 prior to undergoing rotator cuff surgery by Dr. Ardyth Gal.  Resents to the office in follow-up of his recent cardiac catheterization and percutaneous coronary intervention.   Mr. Pease has known CAD, hypertension, hyperlipidemia, hypothyroidism, peripheral vascular disease,probable metabolic syndrome and ongoing tobacco use. He has undergone prior percutaneous coronary interventions with stenting to his LAD and RCA  in 1997, 1998, and in 2004 to his proximal left circumflex coronary artery. He also has established PVD and underwent stenting to his left SFA in 2004 and right SFA in 2008. There is a history of tobacco use, hyperlipidemia and in April 2014 he presented to [facility] with chest pain and T wave abnormalities.  A nuclear perfusion study demonstrated reversible ischemia in the apical segment of the anterior wall. He underwent repeat cardiac catheterization on 09/07/2012 by me and was found to have preserved LV function with minimal focal region of mild inferior hypocontractility. He had significant progression of CAD with ostial 90% eccentric stenosis of the LAD extending into the distal left main proximal to a previously placed proximal LAD stent. There also is 90% stenosis of the LAD after the stented segment. The circumflex vessel is 60% ostial stenosis followed by 60% proximal stenosis prior to the previously placed stent. The RCA stent was patent.  CABG surgery was recommended and this was done by Dr. Roxan Hockey on 09/12/2012 with an off-pump LIMA to LAD and SVG to circumflex marginal 1 vessel.  When I saw Mr. Nobbe, I gave him clearance to undergo rotator cuff surgery. He  tolerated this well.  He denies recent anginal symptoms. He does have a  history of peripheral vascular disease and is status post right common femoral and proximal SFA stenting with left SFA stenting. A one-year followup  duplex study on 04/18/2013 demonstrated ABIs were excellent bilaterally at 1.1 with less than 49% diameter reduction noted in the right common iliac, right common femoral and SFA stent, and was less than 50% diameter reduction in the right distal SFA. There was 50-69% diameter reduction in the left external iliac artery and less than 49% and left common femoral. The left SFA stent was widely patent.  In  February 2016 he developed an episode of increasing shortness of breath associated with diaphoresis.  Two weeks later in the cold weather he developed severe chest tightness with shortness of breath and blood pressure elevation to 592 systolically.  He was seen by [name] on 07/06/2014 after he had again developed some chest pain while moving 40 pounds of bags of salt that he had put in a wheelbarrow.  I reviewed his office note. A nuclear perfusion study which was done on 07/13/2014.  This was significantly changed from his prior study and now was felt to be high risk with a large severe partially reversible anterior apical defect consistent with prior apical infarct, but now with evidence for severe ischemia in the distal anterior wall and apex.  There was apical hypokinesis.  Ejection fraction was 52%.  He was started on nitrates and presented to my office in follow-up.  I recommended definitive cardiac catheterization and on 07/24/2014.  This was performed revealed multivessel native CAD was 70% ostial LAD stenosis, 40-50% stenosis in the proximal  LAD stent in 1995% stenosis after the stented segment in the native LAD with 60% ostial septal perforating artery stenosis.  There was 20 and 30% proximal circumflex stenoses with a widely patent circumflex stent and had a widely patent proximal and mid RCA stents.  His LIMA graft was occluded in its midsegment  prior to its anastomosis into the LAD.  He had widely patent vein graft supplying the distal circumflex marginal vessel.  He underwent successful PCI to the LAD at its ostium and in its midsegment treated with angioscope scoring balloon, insertion of a 3.034 mm Resolute DES stent extending from the ostium to the mid LAD, postdilated 3.41 mm with entire region been reduced to 0%.  Also, he has been without anginal symptoms.  He's remained off cigarettes for aproximately 3 weeks, but unfortunately he again is starting smoking.  He saw [name] in follow-up.  Because of his cough she changed his Brilinta to Effient.  He presents for evaluation.  Past Medical History  Diagnosis Date  . Coronary artery disease     Multiple stents  . COPD (chronic obstructive pulmonary disease)   . Peripheral vascular disease     prior stenting  . Dyslipidemia   . Hyperlipemia   . Allergic rhinitis   . Hypertension   . Myocardial infarct 1997    "mild"  . Anginal pain 1997  . Childhood asthma   . Chronic bronchitis     Yearly  . Hypothyroidism   . GERD (gastroesophageal reflux disease)   . Arthritis     Back    Past Surgical History  Procedure Laterality Date  . Coronary angioplasty with stent placement  '97, '98, '04, '06, '08, '10    seven cardiac stents  . Femoral artery stent Bilateral 7/06  8/08    Lt SFA '06, Rt SFA 8/08  . Laparoscopic cholecystectomy  June 2010  . Left heart catheterization with coronary angiogram N/A 09/07/2012    Procedure: LEFT HEART CATHETERIZATION WITH CORONARY ANGIOGRAM;  Surgeon: Troy Sine, MD;  Location: [facility] CATH LAB;  Service: Cardiovascular;  Laterality: N/A;  . Coronary artery bypass graft N/A 09/12/2012    Procedure: OFF PUMP CORONARY ARTERY BYPASS GRAFTING (CABG);  Surgeon: Melrose Nakayama, MD;  Location: [place];  Service: Open Heart Surgery;  Laterality: N/A;  Times two using left internal mammary artery and endoscopically harvested right saphenous  vein  ;LIMA-LAD and SVG -OM1  . Doppler echocardiography  11/23/2012,09/30/2010     LV EF 50-55%----09/30/2010-LV EF = >55%  . Lexiscan stress test  09/06/2012  . Nm myoview ltd  05/06/2012    EF -STUDY NOT GATED, NORMAL STRESS NUCLEAR ,NORMAL LV FUNCTION  . Lower arterial doppler  03/25/2012    FGT CIA 0-49%; RGT CFA AND SFA AT STENT 0-49%; RGT DISTAL SFA = <50%; LFT. EIA  50-69%; LFT. CFA 0-49% ;;LFT. SFA  AT STENT NORMAL PATENCY  . Carotid doppler  100/24/11    RGT BULB and PROXIMAL ICA 0-49%; LFT. ICA  0-49 ; RGT SUBCLAVIAN ARTERY < 50% ; LFT SUBCLAVIAN normal patency; RGT  and LFT  VERTEBRAL ARTERIES  . Event monitor  11/04/2012-11/17/2012    pt denies this hx on 07/24/2014  . Tonsillectomy  1950's  . Hernia repair  10/2008    "repaired w/gallbladder OR"  . Cardiac catheterization  2004    had remote tandem stents in 1997 and 1998 for LAD lesions, RCA stent in 1997 , midprto=ion, and a proximal RCA  SENT IN 1999, all DES; some were IVIS  guide.  . Coronary angioplasty    . Cardiac catheterization  09/07/2012    demonstrated severe 2- vessel disease,patent LAD,patient circ and patent right stents, but 90% ostial LAD whic was new.  . Left heart catheterization with coronary/graft angiogram N/A 07/24/2014    Procedure: LEFT HEART CATHETERIZATION WITH Beatrix Fetters;  Surgeon: Troy Sine, MD; LAD 70%, 50% ISR, mLAD 99%, CFX 30%, patent stent, RCA patent, LIMA-LAD occluded, SVG-OM 1 patent, EF 50-55%  . Percutaneous coronary stent intervention (pci-s) Right 07/24/2014    Procedure: PERCUTANEOUS CORONARY STENT INTERVENTION (PCI-S);  Surgeon: Troy Sine, MD; ostial LAD PTCA with angioscope scoring balloon, 3.0 x 34 mm Resolute DES extending from the ostium to the mid LAD, covering all significant stenoses     Allergies  Allergen Reactions  . Metoprolol Other (See Comments)    Fatigue and malaise  . Fish Oil   . Levofloxacin     Unknown reaction  . Meloxicam     Unknown  reaction  . Metaxalone     Unknown reaction  . Niaspan [Niacin]   . Sulfonamide Derivatives     unknown reaction - bad rash  . Tricor [Fenofibrate]     Current Outpatient Prescriptions  Medication Sig Dispense Refill  . Albuterol Sulfate (PROAIR RESPICLICK) 761 (90 BASE) MCG/ACT AEPB Inhale 2 puffs into the lungs 4 (four) times daily as needed. 1 each prn  . aspirin EC 81 MG tablet Take 81 mg by mouth daily.    [name] atorvastatin (LIPITOR) 40 MG tablet Take 1 tablet (40 mg total) by mouth daily. 30 tablet 6  . cetirizine (ZYRTEC) 10 MG tablet Take 10 mg by mouth daily.    . cholestyramine (QUESTRAN) 4 G packet Take 1 packet by mouth 2 (two) times daily with a meal. 60 each 11  . Cyanocobalamin (VITAMIN B-12 PO) Take 1 tablet by mouth daily.    . cyclobenzaprine (FLEXERIL) 10 MG tablet Take 10 mg by mouth 3 (three) times daily as needed for muscle spasms.     . isosorbide mononitrate (IMDUR) 30 MG 24 hr tablet Take 15 mg by mouth daily.     [name] levothyroxine (SYNTHROID, LEVOTHROID) 25 MCG tablet Take 25 mcg by mouth daily before breakfast.     . LORazepam (ATIVAN) 0.5 MG tablet Prn. Not to exceed 2 in 24 hours (Patient taking differently: Take 0.5 mg by mouth 2 (two) times daily as needed for anxiety. Prn. Not to exceed 2 in 24 hours) 30 tablet 0  . losartan (COZAAR) 100 MG tablet Take 1 tablet (100 mg total) by mouth daily. 30 tablet 6  . NITROSTAT 0.4 MG SL tablet Place 0.4 mg under the tongue every 5 (five) minutes as needed for chest pain.     . NON FORMULARY daily. lacithin granules 2 TBSP daily.    . pantoprazole (PROTONIX) 40 MG tablet Take 1 tablet by mouth 2 (two) times daily.     . prasugrel (EFFIENT) 10 MG TABS tablet Take 1 tablet (10 mg total) by mouth daily. 90 tablet 3  . nebivolol (BYSTOLIC) 5 MG tablet Take 1 tablet (5 mg total) by mouth daily. 30 tablet 6   No current facility-administered medications for this visit.    History   Social History  . Marital Status: Married      Spouse Name: N/A  . Number of Children: N/A  . Years of Education: N/A   Occupational History  .  Not on file.   Social History Main Topics  . Smoking status: Former Smoker -- 0.25 packs/day for 56 years    Types: Cigarettes    Quit date: 07/24/2014  . Smokeless tobacco: Former Systems developer    Types: Chew  . Alcohol Use: 4.2 oz/week    7 Glasses of wine per week  . Drug Use: No  . Sexual Activity: Not Currently   Other Topics Concern  . 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 Heis married , is one child and 4 grandchildren.[name]  He continues to work part time in [employer].  His work is physical and often requires lifting.  He had smoked heavily for 54 years. He is now smoking 3-cigarettes per day.  He does not drink alcohol or use illicit drugs.  Family History  Problem Relation Age of Onset  . Coronary artery disease Father 52  . Heart attack Father   . Coronary artery disease Brother 2    CABG in 2000    ROS General: Negative; No fevers, chills, or night sweats;  HEENT: Negative; No changes in vision or hearing, sinus congestion, difficulty swallowing Pulmonary: Negative; No cough, wheezing, shortness of breath, hemoptysis Cardiovascular: Negative; No chest pain, presyncope, syncope, palpitations GI: Negative; No nausea, vomiting, diarrhea, or abdominal pain GU: Negative; No dysuria, hematuria, or difficulty voiding Musculoskeletal: Improved range of motion in the left shoulder, but also notes intermittent twinges of pain; no myalgias, or weakness Hematologic/Oncology: Negative; no easy bruising, bleeding Endocrine: Negative; no heat/cold intolerance; no diabetes Neuro: Negative; no changes in balance, headaches Skin: Negative; No rashes or skin lesions Psychiatric: Negative; No behavioral problems, depression Sleep: Negative; No snoring, daytime sleepiness, hypersomnolence, bruxism, restless legs, hypnogognic  hallucinations, no cataplexy Other comprehensive 14 point system review is negative.  PE BP 150/70 mmHg  Pulse 53  Ht '5\' 9"'  (1.753 m)  Wt 200 lb (90.719 kg)  BMI 29.52 kg/m2  Wt Readings from Last 3 Encounters:  09/18/14 200 lb (90.719 kg)  08/01/14 195 lb 12.8 oz (88.814 kg)  07/25/14 191 lb 12.8 oz (87 kg)   General: Alert, oriented, no distress.  Skin: normal turgor, no rashes HEENT: Normocephalic, atraumatic. Pupils round and reactive; sclera anicteric;no lid lag.  Nose without nasal septal hypertrophy Mouth/Parynx benign; Mallinpatti scale 3 Neck: No JVD, no carotid bruits Lungs: Decreased breath sounds without audible wheezing or rales Chest wall: Nontender to palpation Heart: RRR, s1 s2 normal with a 2/6 systolic murmur at the left sternal border. No diastolic murmur. Abdomen: soft, nontender; no hepatosplenomehaly, BS+; abdominal aorta nontender and not dilated by palpation. Pulses: Slightly decreased pulses at the dorsalis pedis and PT bilaterally. Cardiac catheterization site well-healed Back: No CVA tenderness Extremities: no clubbing cyanosis or edema, Homan's sign negative  Neurologic: grossly nonfocal; cranial nerves intact Psychologic: normal affect and mood.  ECG (independently read by me): Sinus bradycardia 53 bpm.  Normal intervals.  No ST changes.  March 2016 Prior ECG (independently read by me): Normal sinus rhythm at 66 beats per minute.  No ectopy.  PR interval 150 ms.  QTc interval 421 ms  January 2015 ECG (independently read by me): Normal sinus rhythm at 67 beats per minute; normal intervals.  LABS:  BMET  BMP Latest Ref Rng 08/01/2014 07/25/2014 07/17/2014  Glucose 70 - 99 mg/dL 90 96 59(L)  BUN 6 - 23 mg/dL '23 15 18  ' Creatinine 0.40 - 1.50 mg/dL 1.15 1.06  1.05  Sodium 135 - 145 mEq/L 138 141 140  Potassium 3.5 - 5.1 mEq/L 4.2 3.9 4.4  Chloride 96 - 112 mEq/L 106 109 105  CO2 19 - 32 mEq/L '28 23 25  ' Calcium 8.4 - 10.5 mg/dL 9.2 8.7 9.1      Hepatic Function Panel    Hepatic Function Latest Ref Rng 07/17/2014 09/05/2012 11/05/2008  Total Protein 6.0 - 8.3 g/dL 6.2 6.4 6.2  Albumin 3.5 - 5.2 g/dL 3.9 3.2(L) 3.5  AST 0 - 37 U/L 22 38(H) 21  ALT 0 - 53 U/L 21 56(H) 27  Alk Phosphatase 39 - 117 U/L 91 94 84  Total Bilirubin 0.2 - 1.2 mg/dL 0.7 0.5 0.6    CBC  CBC Latest Ref Rng 08/01/2014 07/25/2014 07/17/2014  WBC 4.0 - 10.5 K/uL 10.9(H) 4.6 12.7(H)  Hemoglobin 13.0 - 17.0 g/dL 13.9 10.7(L) 14.5  Hematocrit 39.0 - 52.0 % 40.6 32.4(L) 44.8  Platelets 150.0 - 400.0 K/uL 246.0 243 273     BNP No results found for: PROBNP  Lipid Panel     Component Value Date/Time   CHOL 155 09/06/2012 0455   TRIG 179* 09/06/2012 0455   HDL 52 09/06/2012 0455   CHOLHDL 3.0 09/06/2012 0455   VLDL 36 09/06/2012 0455   LDLCALC 67 09/06/2012 0455     RADIOLOGY: No results found.    ASSESSMENT AND PLAN: Mr. Antony Haste is a 69 year old white male who has established coronary as well as peripheral vascular disease.  He is 2 years status post CABG surgery in April 2014 by Dr. Roxan Hockey. Previously he had undergone prior stenting to his proximal LAD, circumflex coronary artery as well as right coronary artery.  He recently had developed recurrent symptomatology and catheterization revealed an occluded LIMA graft and high-grade 70% ostial LAD stenosis with 95% in-stent restenosis in the previously placed stent.  He underwent successful Angiosculpt scoring balloon insertion of a 3.034 mm Resolute DES stent extending from the ostium to the mid LAD with excellent result.  He is been without anginal symptomatology since.  I doubt the Brilinta was responsible for his cough, but he was changed to Effient when he had seen the extender several weeks ago.  He denies recent bleeding.  He is bradycardic and complains of fatigue.  He has been taking metoprolol at just 12.5 mg twice a day.  I will discontinue metoprolol initially started him on  Bystolic at 2.5 mg.  He will monitor his heart rate and if he tolerates this titration to 5 mg may be done.  Had a long discussion again concerning complete cessation of tobacco.  I will see him in 4 months for reevaluation.  Time spent: 25 minutes   Troy Sine, MD, [facility]  09/19/2014 7:56 PM

## 2014-09-24 ENCOUNTER — Telehealth: Payer: Self-pay | Admitting: Cardiovascular Disease

## 2014-09-24 NOTE — Telephone Encounter (Signed)
Pt. waiting for response from Dr. Claiborne Billings, pt. States pharmacy has sent several request

## 2014-09-24 NOTE — Telephone Encounter (Signed)
Brian Brock is calling because the insurance company needs Dr. Claiborne Billings approval to change from generic to a name brand(which is Bystolic) .[name] Please call .[name]   Thanks

## 2014-09-26 ENCOUNTER — Telehealth: Payer: Self-pay | Admitting: *Deleted

## 2014-09-26 NOTE — Telephone Encounter (Signed)
Called and spoke with Danae Chen @ [employer] to get approval for Bystolic 5 mg. The PA was approved from January 2016 through to December 2016. Pharmacist @ [facility] in [place] notified. He will contact the patient.

## 2014-11-06 DIAGNOSIS — M5136 Other intervertebral disc degeneration, lumbar region: Secondary | ICD-10-CM | POA: Diagnosis not present

## 2014-11-15 DIAGNOSIS — M5136 Other intervertebral disc degeneration, lumbar region: Secondary | ICD-10-CM | POA: Diagnosis not present

## 2014-11-20 DIAGNOSIS — M5136 Other intervertebral disc degeneration, lumbar region: Secondary | ICD-10-CM | POA: Diagnosis not present

## 2014-11-20 DIAGNOSIS — M4806 Spinal stenosis, lumbar region: Secondary | ICD-10-CM | POA: Diagnosis not present

## 2014-12-20 ENCOUNTER — Ambulatory Visit (INDEPENDENT_AMBULATORY_CARE_PROVIDER_SITE_OTHER): Payer: Medicare Other | Admitting: Cardiovascular Disease

## 2014-12-20 ENCOUNTER — Encounter: Payer: Self-pay | Admitting: Cardiovascular Disease

## 2014-12-20 VITALS — BP 104/78 | HR 51 | Ht 69.0 in | Wt 199.1 lb

## 2014-12-20 DIAGNOSIS — E8881 Metabolic syndrome: Secondary | ICD-10-CM

## 2014-12-20 DIAGNOSIS — R001 Bradycardia, unspecified: Secondary | ICD-10-CM

## 2014-12-20 DIAGNOSIS — I200 Unstable angina: Secondary | ICD-10-CM | POA: Diagnosis not present

## 2014-12-20 DIAGNOSIS — I25810 Atherosclerosis of coronary artery bypass graft(s) without angina pectoris: Secondary | ICD-10-CM

## 2014-12-20 DIAGNOSIS — Z720 Tobacco use: Secondary | ICD-10-CM

## 2014-12-20 DIAGNOSIS — E785 Hyperlipidemia, unspecified: Secondary | ICD-10-CM

## 2014-12-20 DIAGNOSIS — F17200 Nicotine dependence, unspecified, uncomplicated: Secondary | ICD-10-CM

## 2014-12-20 DIAGNOSIS — R002 Palpitations: Secondary | ICD-10-CM | POA: Diagnosis not present

## 2014-12-20 DIAGNOSIS — I25700 Atherosclerosis of coronary artery bypass graft(s), unspecified, with unstable angina pectoris: Secondary | ICD-10-CM

## 2014-12-20 MED ORDER — CLOPIDOGREL BISULFATE 75 MG PO TABS: 75.0000 mg | ORAL_TABLET | Freq: Every day | ORAL | Status: DC

## 2014-12-20 MED ORDER — EZETIMIBE 10 MG PO TABS
10.0000 mg | ORAL_TABLET | Freq: Every day | ORAL | Status: DC
Start: 2014-12-20 — End: 2015-03-07

## 2014-12-20 MED ORDER — ATORVASTATIN CALCIUM 20 MG PO TABS
ORAL_TABLET | ORAL | Status: DC
Start: 2014-12-20 — End: 2015-02-12

## 2014-12-20 NOTE — Patient Instructions (Signed)
Your physician wants you to follow-up in: [place] will receive a reminder letter in the mail two months in advance. If you don't receive a letter, please call our office to schedule the follow-up appointment.   DECREASE ATORVASTATIN TO 20 MG EVERY OTHER DAY  START ZETIA 10 MG ONCE DAILY  STOP EFFIENT AFTER CURRENT SUPPLY AND SAMPLES  START PLAVIX 75 MG AFTER STOPPING EFFIENT- ONCE DAILY  Your physician recommends that you return for lab work in: [place] OFF [address]

## 2014-12-22 ENCOUNTER — Encounter: Payer: Self-pay | Admitting: Cardiovascular Disease

## 2014-12-22 NOTE — Progress Notes (Signed)
Patient ID: Brian Brock, male   DOB: [birth year], 69 y.o.   MRN: [id number]     HPI: Brian Brock is a 69 y.o. male who is a former patient of Dr. Terance Ice.  He establish cardiology care with me in October 2014.  I last saw him in May 2016.  He presents for follow up evaluation.  Brian Brock has known CAD, hypertension, hyperlipidemia, hypothyroidism, peripheral vascular disease,probable metabolic syndrome and ongoing tobacco use. He has undergone prior percutaneous coronary interventions with stenting to his LAD and RCA  in 1997, 1998, and in 2004 to his proximal left circumflex coronary artery. He also has established PVD and underwent stenting to his left SFA in 2004 and right SFA in 2008. There is a history of tobacco use, hyperlipidemia and in April 2014 he presented to [facility] with chest pain and T wave abnormalities.  A nuclear perfusion study demonstrated reversible ischemia in the apical segment of the anterior wall. He underwent repeat cardiac catheterization on 09/07/2012 by me and was found to have preserved LV function with minimal focal region of mild inferior hypocontractility. He had significant progression of CAD with ostial 90% eccentric stenosis of the LAD extending into the distal left main proximal to a previously placed proximal LAD stent. There also is 90% stenosis of the LAD after the stented segment. The circumflex vessel is 60% ostial stenosis followed by 60% proximal stenosis prior to the previously placed stent. The RCA stent was patent.  CABG surgery was recommended and this was done by Dr. Roxan Hockey on 09/12/2012 with an off-pump LIMA to LAD and SVG to circumflex marginal 1 vessel.  When I saw Brian Brock, I gave him clearance to undergo rotator cuff surgery. He  tolerated this well.  He denies recent anginal symptoms. He does have a history of peripheral vascular disease and is status post right common femoral and proximal SFA stenting with left SFA  stenting. A one-year followup  duplex study on 04/18/2013 demonstrated ABIs were excellent bilaterally at 1.1 with less than 49% diameter reduction noted in the right common iliac, right common femoral and SFA stent, and was less than 50% diameter reduction in the right distal SFA. There was 50-69% diameter reduction in the left external iliac artery and less than 49% and left common femoral. The left SFA stent was widely patent.  In  February 2016 he developed an episode of increasing shortness of breath associated with diaphoresis.  Two weeks later in the cold weather he developed severe chest tightness with shortness of breath and blood pressure elevation to 494 systolically.  He was seen by [name] on 07/06/2014 after he had again developed some chest pain while moving 40 pounds of bags of salt that he had put in a wheelbarrow.  I reviewed his office note. A nuclear perfusion study which was done on 07/13/2014.  This was significantly changed from his prior study and now was felt to be high risk with a large severe partially reversible anterior apical defect consistent with prior apical infarct, but now with evidence for severe ischemia in the distal anterior wall and apex.  There was apical hypokinesis.  Ejection fraction was 52%.  He was started on nitrates and presented to my office in follow-up.  I recommended definitive cardiac catheterization and on 07/24/2014.  This was performed revealed multivessel native CAD was 70% ostial LAD stenosis, 40-50% stenosis in the proximal LAD stent in 1995% stenosis after the stented segment in the native  LAD with 60% ostial septal perforating artery stenosis.  There was 20 and 30% proximal circumflex stenoses with a widely patent circumflex stent and had a widely patent proximal and mid RCA stents.  His LIMA graft was occluded in its midsegment prior to its anastomosis into the LAD.  He had widely patent vein graft supplying the distal circumflex marginal vessel.   He underwent successful PCI to the LAD at its ostium and in its midsegment treated with angioscope scoring balloon, insertion of a 3.034 mm Resolute DES stent extending from the ostium to the mid LAD, postdilated 3.41 mm with entire region been reduced to 0%.  He has been without anginal symptoms.  He's remained off cigarettes for aproximately 3 weeks, but unfortunately he again is starting smoking.  He saw [name] in follow-up.  Because of his cough she changed his Brilinta to Effient.  I saw him 3 months ago for evaluation at which time, I discontinued metoprolol and started him on Bystolic.  Recently, he has reduced his Lipitor to 20 mg daily.  Due to development of smile myalgias.  He is concerned about the cost of Effient.  He denies recurrent anginal symptoms.  He denies shortness of breath.  He denies PND, orthopnea.  He is unaware of palpitations.  Past Medical History  Diagnosis Date  . Coronary artery disease     Multiple stents  . COPD (chronic obstructive pulmonary disease)   . Peripheral vascular disease     prior stenting  . Dyslipidemia   . Hyperlipemia   . Allergic rhinitis   . Hypertension   . Myocardial infarct 1997    "mild"  . Anginal pain 1997  . Childhood asthma   . Chronic bronchitis     Yearly  . Hypothyroidism   . GERD (gastroesophageal reflux disease)   . Arthritis     Back    Past Surgical History  Procedure Laterality Date  . Coronary angioplasty with stent placement  '97, '98, '04, '06, '08, '10    seven cardiac stents  . Femoral artery stent Bilateral 7/06  8/08    Lt SFA '06, Rt SFA 8/08  . Laparoscopic cholecystectomy  June 2010  . Left heart catheterization with coronary angiogram N/A 09/07/2012    Procedure: LEFT HEART CATHETERIZATION WITH CORONARY ANGIOGRAM;  Surgeon: Troy Sine, MD;  Location: [facility] CATH LAB;  Service: Cardiovascular;  Laterality: N/A;  . Coronary artery bypass graft N/A 09/12/2012    Procedure: OFF PUMP CORONARY  ARTERY BYPASS GRAFTING (CABG);  Surgeon: Melrose Nakayama, MD;  Location: [facility];  Service: Open Heart Surgery;  Laterality: N/A;  Times two using left internal mammary artery and endoscopically harvested right saphenous vein  ;LIMA-LAD and SVG -OM1  . Doppler echocardiography  11/23/2012,09/30/2010     LV EF 50-55%----09/30/2010-LV EF = >55%  . Lexiscan stress test  09/06/2012  . Nm myoview ltd  05/06/2012    EF -STUDY NOT GATED, NORMAL STRESS NUCLEAR ,NORMAL LV FUNCTION  . Lower arterial doppler  03/25/2012    FGT CIA 0-49%; RGT CFA AND SFA AT STENT 0-49%; RGT DISTAL SFA = <50%; LFT. EIA  50-69%; LFT. CFA 0-49% ;;LFT. SFA  AT STENT NORMAL PATENCY  . Carotid doppler  100/24/11    RGT BULB and PROXIMAL ICA 0-49%; LFT. ICA  0-49 ; RGT SUBCLAVIAN ARTERY < 50% ; LFT SUBCLAVIAN normal patency; RGT  and LFT  VERTEBRAL ARTERIES  . Event monitor  11/04/2012-11/17/2012    pt denies  this hx on 07/24/2014  . Tonsillectomy  1950's  . Hernia repair  10/2008    "repaired w/gallbladder OR"  . Cardiac catheterization  2004    had remote tandem stents in 1997 and 1998 for LAD lesions, RCA stent in 1997 , midprto=ion, and a proximal RCA  SENT IN 1999, all DES; some were IVIS  guide.  . Coronary angioplasty    . Cardiac catheterization  09/07/2012    demonstrated severe 2- vessel disease,patent LAD,patient circ and patent right stents, but 90% ostial LAD whic was new.  . Left heart catheterization with coronary/graft angiogram N/A 07/24/2014    Procedure: LEFT HEART CATHETERIZATION WITH Beatrix Fetters;  Surgeon: Troy Sine, MD; LAD 70%, 50% ISR, mLAD 99%, CFX 30%, patent stent, RCA patent, LIMA-LAD occluded, SVG-OM 1 patent, EF 50-55%  . Percutaneous coronary stent intervention (pci-s) Right 07/24/2014    Procedure: PERCUTANEOUS CORONARY STENT INTERVENTION (PCI-S);  Surgeon: Troy Sine, MD; ostial LAD PTCA with angioscope scoring balloon, 3.0 x 34 mm Resolute DES extending from the ostium to the mid  LAD, covering all significant stenoses     Allergies  Allergen Reactions  . Metoprolol Other (See Comments)    Fatigue and malaise  . Fish Oil   . Levofloxacin     Unknown reaction  . Meloxicam     Unknown reaction  . Metaxalone     Unknown reaction  . Niaspan [Niacin]   . Sulfonamide Derivatives     unknown reaction - bad rash  . Tricor [Fenofibrate]     Current Outpatient Prescriptions  Medication Sig Dispense Refill  . Albuterol Sulfate (PROAIR RESPICLICK) 947 (90 BASE) MCG/ACT AEPB Inhale 2 puffs into the lungs 4 (four) times daily as needed. 1 each prn  . aspirin EC 81 MG tablet Take 81 mg by mouth daily.    [name] atorvastatin (LIPITOR) 20 MG tablet Take one tablet every other day 45 tablet 3  . cetirizine (ZYRTEC) 10 MG tablet Take 10 mg by mouth daily.    . cholestyramine (QUESTRAN) 4 G packet Take 1 packet by mouth 2 (two) times daily with a meal. 60 each 11  . Cyanocobalamin (VITAMIN B-12 PO) Take 1 tablet by mouth daily.    . cyclobenzaprine (FLEXERIL) 10 MG tablet Take 10 mg by mouth 3 (three) times daily as needed for muscle spasms.     . isosorbide mononitrate (IMDUR) 30 MG 24 hr tablet Take 15 mg by mouth daily.     [name] levothyroxine (SYNTHROID, LEVOTHROID) 25 MCG tablet Take 25 mcg by mouth daily before breakfast.     . losartan (COZAAR) 100 MG tablet Take 1 tablet (100 mg total) by mouth daily. 30 tablet 6  . nebivolol (BYSTOLIC) 5 MG tablet Take 1 tablet (5 mg total) by mouth daily. 30 tablet 6  . NITROSTAT 0.4 MG SL tablet Place 0.4 mg under the tongue every 5 (five) minutes as needed for chest pain.     . pantoprazole (PROTONIX) 40 MG tablet Take 1 tablet by mouth 2 (two) times daily.     . clopidogrel (PLAVIX) 75 MG tablet Take 1 tablet (75 mg total) by mouth daily. 90 tablet 3  . ezetimibe (ZETIA) 10 MG tablet Take 1 tablet (10 mg total) by mouth daily. 90 tablet 3  . nabumetone (RELAFEN) 500 MG tablet Take 1 tablet by mouth 2 (two) times daily. Take 1 tab twice  daily     No current facility-administered medications for this visit.  History   Social History  . Marital Status: Married    Spouse Name: N/A  . Number of Children: N/A  . Years of Education: N/A   Occupational History  . Not on file.   Social History Main Topics  . Smoking status: Former Smoker -- 0.25 packs/day for 56 years    Types: Cigarettes    Quit date: 07/24/2014  . Smokeless tobacco: Former Systems developer    Types: Chew  . Alcohol Use: 4.2 oz/week    7 Glasses of wine per week  . Drug Use: No  . Sexual Activity: Not Currently   Other Topics Concern  . 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 Heis married , is one child and 4 grandchildren.[name]  He continues to work part time in [employer].  His work is physical and often requires lifting.  He had smoked heavily for 54 years. He is now smoking 3-cigarettes per day.  He does not drink alcohol or use illicit drugs.  Family History  Problem Relation Age of Onset  . Coronary artery disease Father 1  . Heart attack Father   . Coronary artery disease Brother 53    CABG in 2000    ROS General: Negative; No fevers, chills, or night sweats;  HEENT: Negative; No changes in vision or hearing, sinus congestion, difficulty swallowing Pulmonary: Negative; No cough, wheezing, shortness of breath, hemoptysis Cardiovascular: Negative; No chest pain, presyncope, syncope, palpitations GI: Negative; No nausea, vomiting, diarrhea, or abdominal pain GU: Negative; No dysuria, hematuria, or difficulty voiding Musculoskeletal: Improved range of motion in the left shoulder, but also notes intermittent twinges of pain; positive for occasional myalgias  Hematologic/Oncology: Negative; no easy bruising, bleeding Endocrine: Negative; no heat/cold intolerance; no diabetes Neuro: Negative; no changes in balance, headaches Skin: Negative; No rashes or skin lesions Psychiatric: Negative; No  behavioral problems, depression Sleep: Negative; No snoring, daytime sleepiness, hypersomnolence, bruxism, restless legs, hypnogognic hallucinations, no cataplexy Other comprehensive 14 point system review is negative.  PE BP 104/78 mmHg  Pulse 51  Ht 5' 9" (1.753 m)  Wt 199 lb 2 oz (90.323 kg)  BMI 29.39 kg/m2  Wt Readings from Last 3 Encounters:  12/20/14 199 lb 2 oz (90.323 kg)  09/18/14 200 lb (90.719 kg)  08/01/14 195 lb 12.8 oz (88.814 kg)   General: Alert, oriented, no distress.  Skin: normal turgor, no rashes HEENT: Normocephalic, atraumatic. Pupils round and reactive; sclera anicteric;no lid lag.  Nose without nasal septal hypertrophy Mouth/Parynx benign; Mallinpatti scale 3 Neck: No JVD, no carotid bruits Lungs: Decreased breath sounds without audible wheezing or rales Chest wall: Nontender to palpation Heart: RRR, s1 s2 normal with a 2/6 systolic murmur at the left sternal border. No diastolic murmur.  No rubs thrills or heaves. Abdomen: soft, nontender; no hepatosplenomehaly, BS+; abdominal aorta nontender and not dilated by palpation. Pulses: Slightly decreased pulses at the dorsalis pedis and PT bilaterally. Cardiac catheterization site well-healed Back: No CVA tenderness Extremities: no clubbing cyanosis or edema, Homan's sign negative  Neurologic: grossly nonfocal; cranial nerves intact Psychologic: normal affect and mood.  ECG (independently read by me): Sinus bradycardia 51 bpm.  Normal intervals.  No significant ST changes.  May 2016 ECG (independently read by me): Sinus bradycardia 53 bpm.  Normal intervals.  No ST changes.  March 2016 Prior ECG (independently read by me): Normal sinus rhythm at 66 beats per minute.  No ectopy.  PR interval  150 ms.  QTc interval 421 ms  January 2015 ECG (independently read by me): Normal sinus rhythm at 67 beats per minute; normal intervals.  LABS:  BMET  BMP Latest Ref Rng 08/01/2014 07/25/2014 07/17/2014  Glucose 70 - 99  mg/dL 90 96 59(L)  BUN 6 - 23 mg/dL _0 Creatinine 0.40 - 1.50 mg/dL 1.15 1.06 1.05  Sodium 135 - 145 mEq/L 138 141 140  Potassium 3.5 - 5.1 mEq/L 4.2 3.9 4.4  Chloride 96 - 112 mEq/L 106 109 105  CO2 19 - 32 mEq/L _1 Calcium 8.4 - 10.5 mg/dL 9.2 8.7 9.1     Hepatic Function Panel    Hepatic Function Latest Ref Rng 07/17/2014 09/05/2012 11/05/2008  Total Protein 6.0 - 8.3 g/dL 6.2 6.4 6.2  Albumin 3.5 - 5.2 g/dL 3.9 3.2(L) 3.5  AST 0 - 37 U/L 22 38(H) 21  ALT 0 - 53 U/L 21 56(H) 27  Alk Phosphatase 39 - 117 U/L 91 94 84  Total Bilirubin 0.2 - 1.2 mg/dL 0.7 0.5 0.6    CBC  CBC Latest Ref Rng 08/01/2014 07/25/2014 07/17/2014  WBC 4.0 - 10.5 K/uL 10.9(H) 4.6 12.7(H)  Hemoglobin 13.0 - 17.0 g/dL 13.9 10.7(L) 14.5  Hematocrit 39.0 - 52.0 % 40.6 32.4(L) 44.8  Platelets 150.0 - 400.0 K/uL 246.0 243 273     BNP No results found for: PROBNP  Lipid Panel     Component Value Date/Time   CHOL 155 09/06/2012 0455   TRIG 179* 09/06/2012 0455   HDL 52 09/06/2012 0455   CHOLHDL 3.0 09/06/2012 0455   VLDL 36 09/06/2012 0455   LDLCALC 67 09/06/2012 0455     RADIOLOGY: No results found.    ASSESSMENT AND PLAN: Mr. Antony Haste is a 69 year old white male who has established CAD and PVD.   He is status post CABG surgery in April 2014 by Dr. Roxan Hockey. Previously he had undergone prior stenting to his proximal LAD, circumflex coronary artery as well as right coronary artery.  He recently had developed recurrent symptomatology and catheterization revealed an occluded LIMA graft and high-grade 70% ostial LAD stenosis with 95% in-stent restenosis in the previously placed stent.  He underwent successful Angiosculpt scoring balloon insertion of a 3.034 mm Resolute DES stent extending from the ostium to the mid LAD with excellent result.  He is been without anginal symptomatology since.  He is now significantly concerned about the cost of Effient.  I did provide him with a month  supply of samples.  However, we will then resume clopidogrel at 75 mg daily, but I will check a P2 white 12 blood tests to make certain he is Plavix responsive after 1-2 weeks of Plavix therapy.  He has experienced occasional myalgias and is on a reduced dose of atorvastatin, which had been 40 mg and now is 20 mg.  I've suggested he further reduce this to every other day, but so as not to increase his LDL cholesterol significantly.  I'm adding Zetia 10 mg daily.  He's not having any anginal symptoms.  He is bradycardic and controlled on low-dose Bystolic at 5 mg daily.  Again discussed the importance of continued tobacco cessation.  I will see him in 6 months for follow-up evaluation.  Time spent: 25 minutes   Troy Sine, MD, [facility]  12/22/2014 12:18 PM

## 2015-02-05 ENCOUNTER — Telehealth: Payer: Self-pay | Admitting: Cardiovascular Disease

## 2015-02-05 NOTE — Telephone Encounter (Signed)
Wants to change his Zetia Prescription because it is too expensive. He said he spoke with the pharmacist and they recommended, Crestor(generic).   Does he needs to stay on Liptor?  Take last effient tonight, start Plavix tomorrow night  Leave message, RX [place].

## 2015-02-05 NOTE — Telephone Encounter (Signed)
Pt called in stating that he wanted to speak with the nurse about changing his Zetia  Prescription because it is too expensive. He said he spoke with the pharmacist and they recommended, Crestor(generic). Please f/u with this pt.  Thanks

## 2015-02-07 NOTE — Telephone Encounter (Signed)
Can this encounter be closed?

## 2015-02-08 NOTE — Telephone Encounter (Signed)
Can this encounter be closed?

## 2015-02-11 NOTE — Telephone Encounter (Signed)
Cannot be on atorvastatin and crestor;  If no zetia, then dc lipitor and start crestor 20 mg with plan to titrate to 40 mg

## 2015-02-12 MED ORDER — ROSUVASTATIN CALCIUM 20 MG PO TABS: 20.0000 mg | ORAL_TABLET | Freq: Every day | ORAL | Status: DC

## 2015-02-12 NOTE — Telephone Encounter (Signed)
Talked with patient  Will start Crestor 20 mg and stop Lipitor  Explained to patient that his dosage will be increased up to 40 mg over time  Generic Crestor 20 mg RX sent to [facility] in [place]

## 2015-03-04 DIAGNOSIS — J449 Chronic obstructive pulmonary disease, unspecified: Secondary | ICD-10-CM | POA: Diagnosis not present

## 2015-03-06 ENCOUNTER — Other Ambulatory Visit: Payer: Self-pay | Admitting: Cardiovascular Disease

## 2015-03-06 MED ORDER — ISOSORBIDE MONONITRATE ER 30 MG PO TB24: 15.0000 mg | ORAL_TABLET | Freq: Every day | ORAL | Status: DC

## 2015-03-06 NOTE — Telephone Encounter (Signed)
°  STAT if patient is at the pharmacy , call can be transferred to refill team.   1. Which medications need to be refilled? Isosorbide   2. Which pharmacy/location is medication to be sent to?[place]   3. Do they need a 30 day or 90 day supply? [place]

## 2015-03-06 NOTE — Telephone Encounter (Signed)
Rx(s) sent to pharmacy electronically.  

## 2015-03-07 ENCOUNTER — Other Ambulatory Visit (HOSPITAL_COMMUNITY)
Admission: RE | Admit: 2015-03-07 | Discharge: 2015-03-07 | Disposition: A | Discharge status: Home / Self Care | Payer: Medicare Other | Source: Ambulatory Visit | Attending: Cardiovascular Disease | Admitting: Cardiovascular Disease

## 2015-03-07 ENCOUNTER — Ambulatory Visit (INDEPENDENT_AMBULATORY_CARE_PROVIDER_SITE_OTHER): Payer: Medicare Other | Admitting: Internal Medicine

## 2015-03-07 ENCOUNTER — Encounter: Payer: Self-pay | Admitting: Internal Medicine

## 2015-03-07 VITALS — BP 122/70 | HR 54 | Ht 69.0 in | Wt 195.8 lb

## 2015-03-07 DIAGNOSIS — J449 Chronic obstructive pulmonary disease, unspecified: Secondary | ICD-10-CM

## 2015-03-07 DIAGNOSIS — I25700 Atherosclerosis of coronary artery bypass graft(s), unspecified, with unstable angina pectoris: Secondary | ICD-10-CM | POA: Diagnosis not present

## 2015-03-07 DIAGNOSIS — F17200 Nicotine dependence, unspecified, uncomplicated: Secondary | ICD-10-CM

## 2015-03-07 DIAGNOSIS — I200 Unstable angina: Secondary | ICD-10-CM

## 2015-03-07 DIAGNOSIS — I25708 Atherosclerosis of coronary artery bypass graft(s), unspecified, with other forms of angina pectoris: Secondary | ICD-10-CM | POA: Diagnosis not present

## 2015-03-07 DIAGNOSIS — Z23 Encounter for immunization: Secondary | ICD-10-CM

## 2015-03-07 LAB — PLATELET INHIBITION P2Y12: Platelet Function  P2Y12: 101 [PRU] — ABNORMAL LOW (ref 194–418)

## 2015-03-07 MED ORDER — AEROCHAMBER MV MISC: Status: DC

## 2015-03-07 NOTE — Patient Instructions (Addendum)
Flu vax  Order- Script printed for aerochamber spacer tube for use with your Ruthe Mannan     (pt stated at end of visit that he has aerochamber at home-not given at today's OV).

## 2015-03-07 NOTE — Progress Notes (Signed)
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 Persistent dry cough started 2 or 3 months after 2V CABG April, 2014. Primary physician gave prednisone for arthralgias, and amoxicillin. Tried a pro air inhaler in June for mild wheeze. Dropped off of Dulera inhaler which made his throat sore. Occasional brown phlegm. Denies blood, fever or shortness of breath with exertion Quit smoking April, 2014. Previous diagnosis of sleep apnea. PFT 06/26/09- WNL FEV1/FVC 0.75. CXR 10/11/12 IMPRESSION:  Improved effusions as described. No other focal abnormality is  noted.  Original Report Authenticated By: Inez Catalina, M.D.  02/22/13- 39 yoM former smoker (35 pk yrs) Former Patient- re-established for asthma/ bronchitis complicated by CAD/ CABG, GERD FOLLOWS FOR:  Still having some chest congestion. Had flu vax. Just off prednisone for shoulder. Still min congestion chest. Scant clear phlegm. Took Dulera 200, the 100. Both made him very hoarse despite rinse, so he quit. Used sample Spiriva.  08/24/13- 67 yoM former smoker (55 pk yrs) Former Patient- re-established for asthma/ bronchitis complicated by CAD/ CABG, GERD FOLLOWS FOR:c/o chest congestion since Jan.-finished 2 abx since then, pred. taper on 5mg  now,cough-yellow,no fcs,mild sob with exertion,occass. wheezing Dulera makes him hoarse. Minimal DOE.  Says CXR "ok" in January at [place] [facility].  02/27/14- 68 yoM smoker (54 pk yrs) Former Patient- re-established for asthma/ bronchitis complicated by CAD/ CABG, GERD FOLLOWS FOR:  breathing is doing ok.  congestion at times from smoking He has been smoking again- discussed. Variable congestion not related to weather, scant clear. Denies chest  pain, nodes, shortness of breath. Tried Breo- too expensive. Rarely uses SABA.  03/07/15- 28 yoM smoker (54 pk yrs) followed for asthma/ bronchitis complicated by CAD/ CABG, GERD Follows for: pt states that his breathing is doing well. He is currently on doxy for chest congestion x3 days. Pt c/o wet cough with yellow mucus and some wheezing. Pt did use albuterol HFA and with relief.  He tells me he is doing well. Currently on doxycycline for acute bronchitis as noted. Murtis Sink but it makes him hoarse. CXR 07/18/14 IMPRESSION: FINDINGS: Stable appearance of biapical pleural-parenchymal scarring. The lungs are clear without focal infiltrate, edema, pneumothorax or pleural effusion. Interstitial markings are diffusely coarsened with chronic features. The cardiopericardial silhouette is within normal limits for size. Patient is status post CABG. Imaged bony structures of the thorax are intact. Stable. No acute findings. Electronically Signed  By: Misty Stanley M.D.  On: 07/18/2014 11:49  ROS-see HPI Constitutional:   No-   weight loss, night sweats, fevers, chills, fatigue, lassitude. HEENT:   No-  headaches, difficulty swallowing, tooth/dental problems, sore throat,       No-  sneezing, itching, ear ache, nasal congestion, post nasal drip,  CV:  No-   chest pain, orthopnea, PND, swelling in lower extremities, anasarca, dizziness, palpitations Resp: No-shortness of breath with exertion or at rest.              No-   productive cough,  + non-productive cough,  No- coughing up of blood.              No-   change in color of mucus.  No- wheezing.   Skin: No-   rash or lesions. GI:  No-  heartburn, indigestion, abdominal pain, nausea, vomiting,  GU:  MS:  No-   joint pain or swelling.  . Neuro-     nothing unusual Psych:  No- change in mood or affect. No depression or anxiety.  No memory loss.  OBJ- Physical Exam General- Alert, Oriented, Affect-appropriate, Distress- none  acute Skin- rash-none, lesions- none, excoriation- none Lymphadenopathy- none Head- atraumatic            Eyes- Gross vision intact, PERRLA, conjunctivae and secretions clear            Ears- Hearing, canals-normal            Nose- Clear, no-Septal dev, mucus, polyps, erosion, perforation             Throat- Mallampati II , mucosa clear , drainage- none, tonsils- atrophic, dentures Neck- flexible , trachea midline, no stridor , thyroid nl, carotid no bruit Chest - symmetrical excursion , unlabored           Heart/CV- RRR , no murmur , no gallop  , no rub, nl s1 s2                           - JVD- none , edema- none, stasis changes- none, varices- none           Lung- clear, wheeze- none, cough- none , dullness-none, rub- none           Chest wall-  Abd-  Br/ Gen/ Rectal- Not done, not indicated Extrem- cyanosis- none, clubbing, none, atrophy- none, strength- nl Neuro- grossly intact to observation

## 2015-03-09 DIAGNOSIS — J449 Chronic obstructive pulmonary disease, unspecified: Secondary | ICD-10-CM | POA: Insufficient documentation

## 2015-03-09 NOTE — Assessment & Plan Note (Signed)
He denies acute symptoms and continues active follow-up with cardiology

## 2015-03-09 NOTE — Assessment & Plan Note (Signed)
Currently controlled. We discussed use of his AeroChamber with his Dulera to reduce hoarseness so he can continue Dulera maintenance. Flu vaccine

## 2015-03-09 NOTE — Assessment & Plan Note (Signed)
Has remained abstinent since last visit

## 2015-04-23 DIAGNOSIS — J441 Chronic obstructive pulmonary disease with (acute) exacerbation: Secondary | ICD-10-CM | POA: Diagnosis not present

## 2015-05-22 DIAGNOSIS — J449 Chronic obstructive pulmonary disease, unspecified: Secondary | ICD-10-CM | POA: Diagnosis not present

## 2015-05-22 DIAGNOSIS — J45909 Unspecified asthma, uncomplicated: Secondary | ICD-10-CM | POA: Diagnosis not present

## 2015-07-26 ENCOUNTER — Other Ambulatory Visit: Payer: Self-pay | Admitting: Cardiology

## 2015-08-07 ENCOUNTER — Other Ambulatory Visit: Payer: Self-pay | Admitting: *Deleted

## 2015-08-07 MED ORDER — ROSUVASTATIN CALCIUM 20 MG PO TABS: 20.0000 mg | ORAL_TABLET | Freq: Every day | ORAL | Status: DC

## 2015-08-21 ENCOUNTER — Other Ambulatory Visit: Payer: Self-pay | Admitting: Cardiovascular Disease

## 2015-08-21 NOTE — Telephone Encounter (Signed)
Rx(s) sent to pharmacy electronically.  

## 2015-09-11 ENCOUNTER — Ambulatory Visit (INDEPENDENT_AMBULATORY_CARE_PROVIDER_SITE_OTHER): Payer: Medicare Other | Admitting: Physician Assistant

## 2015-09-11 ENCOUNTER — Encounter: Payer: Self-pay | Admitting: Physician Assistant

## 2015-09-11 VITALS — BP 130/60 | HR 56 | Ht 69.0 in | Wt 202.0 lb

## 2015-09-11 DIAGNOSIS — I2583 Coronary atherosclerosis due to lipid rich plaque: Secondary | ICD-10-CM

## 2015-09-11 DIAGNOSIS — E785 Hyperlipidemia, unspecified: Secondary | ICD-10-CM

## 2015-09-11 DIAGNOSIS — I739 Peripheral vascular disease, unspecified: Secondary | ICD-10-CM | POA: Diagnosis not present

## 2015-09-11 DIAGNOSIS — I10 Essential (primary) hypertension: Secondary | ICD-10-CM

## 2015-09-11 DIAGNOSIS — I480 Paroxysmal atrial fibrillation: Secondary | ICD-10-CM | POA: Diagnosis not present

## 2015-09-11 DIAGNOSIS — I2510 Atherosclerotic heart disease of native coronary artery without angina pectoris: Secondary | ICD-10-CM | POA: Diagnosis not present

## 2015-09-11 MED ORDER — NITROSTAT 0.4 MG SL SUBL: 0.4000 mg | SUBLINGUAL_TABLET | SUBLINGUAL | Status: DC | PRN

## 2015-09-11 NOTE — Progress Notes (Signed)
Patient ID: Brian Brock, male   DOB: 1945-05-23, 70 y.o.   MRN: [id number]    Date:  09/11/2015   ID:  Brian Brock, DOB 05-23-1945, MRN [id number]  PCP:  [name], MD  Primary Cardiologist:  Claiborne Billings  Chief Complaint  Patient presents with  . Follow-up    Occ sensation in Lt chest, occ shortness of breath, Pain in both lower legs with walking,      History of Present Illness: Brian Brock is a 70 y.o. male with known CAD, hypertension, hyperlipidemia, hypothyroidism, peripheral vascular disease,probable metabolic syndrome and ongoing tobacco use. He has undergone prior percutaneous coronary interventions with stenting to his LAD and RCA in 1997, 1998, and in 2004 to his proximal left circumflex coronary artery. He also has established PVD and underwent stenting to his left SFA in 2004 and right SFA in 2008. There is a history of tobacco use, hyperlipidemia and in April 2014 he presented to [facility] with chest pain and T wave abnormalities. A nuclear perfusion study demonstrated reversible ischemia in the apical segment of the anterior wall. He underwent repeat cardiac catheterization on 09/07/2012 by me and was found to have preserved LV function with minimal focal region of mild inferior hypocontractility. He had significant progression of CAD with ostial 90% eccentric stenosis of the LAD extending into the distal left main proximal to a previously placed proximal LAD stent. There also is 90% stenosis of the LAD after the stented segment. The circumflex vessel is 60% ostial stenosis followed by 60% proximal stenosis prior to the previously placed stent. The RCA stent was patent. CABG surgery was recommended and this was done by Dr. Roxan Hockey on 09/12/2012 with an off-pump LIMA to LAD and SVG to circumflex marginal 1 vessel.  He underwent rotator cuff surgery. He tolerated this well. He does have a history of peripheral vascular disease and is status post right common  femoral and proximal SFA stenting with left SFA stenting. A one-year followup duplex study on 04/18/2013 demonstrated ABIs were excellent bilaterally at 1.1 with less than 49% diameter reduction noted in the right common iliac, right common femoral and SFA stent, and was less than 50% diameter reduction in the right distal SFA. There was 50-69% diameter reduction in the left external iliac artery and less than 49% and left common femoral. The left SFA stent was widely patent.  In February 2016 he developed an episode of increasing shortness of breath associated with diaphoresis. Two weeks later in the cold weather he developed severe chest tightness with shortness of breath and blood pressure elevation to 99991111 systolically. He was seen by me on 07/06/2014 after he had again developed some chest pain while moving 40 pounds of bags of salt that he had put in a wheelbarrow. A nuclear perfusion study which was done on 07/13/2014. This was significantly changed from his prior study and now was felt to be high risk with a large severe partially reversible anterior apical defect consistent with prior apical infarct, but now with evidence for severe ischemia in the distal anterior wall and apex. There was apical hypokinesis. Ejection fraction was 52%. He was started on nitrates. Dr. Claiborne Billings recommended definitive cardiac catheterization and on 07/24/2014. This was performed revealed multivessel native CAD was 70% ostial LAD stenosis, 40-50% stenosis in the proximal LAD stent in 1995% stenosis after the stented segment in the native LAD with 60% ostial septal perforating artery stenosis. There was 20 and 30% proximal circumflex stenoses with a  widely patent circumflex stent and had a widely patent proximal and mid RCA stents. His LIMA graft was occluded in its midsegment prior to its anastomosis into the LAD. He had widely patent vein graft supplying the distal circumflex marginal vessel. He underwent  successful PCI to the LAD at its ostium and in its midsegment treated with angioscope scoring balloon, insertion of a 3.034 mm Resolute DES stent extending from the ostium to the mid LAD, postdilated 3.41 mm with entire region been reduced to 0%.  Brian Brock presents for his six-month evaluation. He reports doing well.  He's having an infrequent "feeling" in his chest which is nothing like the pain he experienced in the past when he needed intervention. He just got back from a week camping at the [place]. He had a great time. He and his wife are currently building a townhome in [place] and hoping to move into it at the end of May. He does report that he can walk about 5-6 minutes before having severe pain in his lower extremities. It's worse in the right leg compared to left.  The patient currently denies nausea, vomiting, fever, chest pain, shortness of breath, orthopnea, dizziness, PND, cough, congestion, abdominal pain, hematochezia, melena, lower extremity edema,   Wt Readings from Last 3 Encounters:  09/11/15 202 lb (91.627 kg)  03/07/15 195 lb 12.8 oz (88.814 kg)  12/20/14 199 lb 2 oz (90.323 kg)     Past Medical History  Diagnosis Date  . Coronary artery disease     Multiple stents  . COPD (chronic obstructive pulmonary disease) ([place])   . Peripheral vascular disease ([place])     prior stenting  . Dyslipidemia   . Hyperlipemia   . Allergic rhinitis   . Hypertension   . Myocardial infarct ([place]) 1997    "mild"  . Anginal pain ([place]) 1997  . Childhood asthma   . Chronic bronchitis ([place])     Yearly  . Hypothyroidism   . GERD (gastroesophageal reflux disease)   . Arthritis     Back    Current Outpatient Prescriptions  Medication Sig Dispense Refill  . Albuterol Sulfate (PROAIR RESPICLICK) [id number] (90 BASE) MCG/ACT AEPB Inhale 2 puffs into the lungs 4 (four) times daily as needed. 1 each prn  . aspirin EC 81 MG tablet Take 81 mg by mouth daily.    . cetirizine (ZYRTEC) 10 MG tablet  Take 10 mg by mouth daily.    . cholestyramine (QUESTRAN) 4 G packet Take 1 packet by mouth 2 (two) times daily with a meal. 60 each 11  . clopidogrel (PLAVIX) 75 MG tablet Take 1 tablet (75 mg total) by mouth daily. 90 tablet 3  . Cyanocobalamin (VITAMIN B-12 PO) Take 1 tablet by mouth daily.    . cyclobenzaprine (FLEXERIL) 10 MG tablet Take 10 mg by mouth 3 (three) times daily as needed for muscle spasms.     . isosorbide mononitrate (IMDUR) 30 MG 24 hr tablet Take 0.5 tablets (15 mg total) by mouth daily. 15 tablet 10  . Lactobacillus (FLORAJEN ACIDOPHILUS PO) Take by mouth daily.    [name] levothyroxine (SYNTHROID, LEVOTHROID) 25 MCG tablet Take 25 mcg by mouth daily before breakfast.     . losartan (COZAAR) 100 MG tablet Take 1 tablet (100 mg total) by mouth daily. <PLEASE MAKE APPOINTMENT FOR REFILLS> 15 tablet 0  . nabumetone (RELAFEN) 500 MG tablet Take 1 tablet by mouth 2 (two) times daily. Take 1 tab twice daily    .  nebivolol (BYSTOLIC) 5 MG tablet Take 1 tablet (5 mg total) by mouth daily. 30 tablet 6  . NITROSTAT 0.4 MG SL tablet Place 1 tablet (0.4 mg total) under the tongue every 5 (five) minutes as needed for chest pain. 25 tablet 3  . pantoprazole (PROTONIX) 40 MG tablet Take 1 tablet by mouth 2 (two) times daily.     . rosuvastatin (CRESTOR) 20 MG tablet Take 1 tablet (20 mg total) by mouth daily. 90 tablet 0   No current facility-administered medications for this visit.    Allergies:    Allergies  Allergen Reactions  . Metoprolol Other (See Comments)    Fatigue and malaise  . Fish Oil   . Levofloxacin     Unknown reaction  . Meloxicam     Unknown reaction  . Metaxalone     Unknown reaction  . Niaspan [Niacin]   . Sulfonamide Derivatives     unknown reaction - bad rash  . Tricor [Fenofibrate]     Social History:  The patient  reports that he quit smoking about 13 months ago. His smoking use included Cigarettes. He has a 14 pack-year smoking history. He has quit using  smokeless tobacco. His smokeless tobacco use included Chew. He reports that he drinks about 4.2 oz of alcohol per week. He reports that he does not use illicit drugs.   Family history:   Family History  Problem Relation Age of Onset  . Coronary artery disease Father 25  . Heart attack Father   . Coronary artery disease Brother 22    CABG in 2000    ROS:  Please see the history of present illness.  All other systems reviewed and negative.   PHYSICAL EXAM: VS:  BP 130/60 mmHg  Pulse 56  Ht 5\' 9"  (1.753 m)  Wt 202 lb (91.627 kg)  BMI 29.82 kg/m2 Obese, well developed, in no acute distress HEENT: Pupils are equal round react to light accommodation extraocular movements are intact.  Neck: no JVDNo cervical lymphadenopathy. Cardiac: Regular rate and rhythm without murmurs rubs or gallops. Lungs:  clear to auscultation bilaterally, no wheezing, rhonchi or rales Abd: soft, nontender, positive bowel sounds all quadrants, no hepatosplenomegaly Ext: no lower extremity edema.  2+ radial and 0 left dorsalis pedis and PT pulses. 1+ right PT pulse Skin: warm and dry Neuro:  Grossly normal  Lipid Panel     Component Value Date/Time   CHOL 155 09/06/2012 0455   TRIG 179* 09/06/2012 0455   HDL 52 09/06/2012 0455   CHOLHDL 3.0 09/06/2012 0455   VLDL 36 09/06/2012 0455   LDLCALC 67 09/06/2012 0455      ASSESSMENT AND PLAN:  Problem List Items Addressed This Visit    PVD- Lt SFA stent '04, Rt SFA stent '08 (Chronic)   Relevant Medications   NITROSTAT 0.4 MG SL tablet   RESOLVED: Peripheral vascular disease ([place]) - Primary   Relevant Medications   NITROSTAT 0.4 MG SL tablet   Other Relevant Orders   VAS [place] LOWER EXTREMITY ARTERIAL DUPLEX   PAF (paroxysmal atrial fibrillation) (HCC)   Relevant Medications   NITROSTAT 0.4 MG SL tablet   Hyperlipidemia (Chronic)   Relevant Medications   NITROSTAT 0.4 MG SL tablet   HTN (hypertension)   Relevant Medications   NITROSTAT 0.4 MG SL  tablet   Claudication (HCC)   CAD- CABG X 17 August 2012 (Chronic)   Relevant Medications   NITROSTAT 0.4 MG SL tablet  Overall Brian Brock appears to be doing well. He's not experiencing any angina and is able to do most of his normal activities.  He does describe lower extremity pain, right greater than left, after about 5-6 minutes of walking. He does have very poor distal pulses however, his feet are warm. He has known peripheral vascular disease and his last set of lower arterial Dopplers were in 2014. I have ordered repeat Dopplers today.  Blood pressure is well-controlled. I'll continue aspirin, Plavix, imdur 15, by bystolic 5 mg Crestor 20 mg. We refilled his Cozaar.  FU in 6 months with Dr. Claiborne Billings.

## 2015-09-11 NOTE — Patient Instructions (Signed)
Your physician wants you to follow-up in: 6 Months with Dr Claiborne Billings. You will receive a reminder letter in the mail two months in advance. If you don't receive a letter, please call our office to schedule the follow-up appointment.  Your physician has requested that you have a lower extremity arterial duplex. This test is an ultrasound of the arteries in the legs or arms. It looks at arterial blood flow in the legs and arms. Allow one hour for Lower and Upper Arterial scans. There are no restrictions or special instructions

## 2015-09-12 ENCOUNTER — Other Ambulatory Visit: Payer: Self-pay | Admitting: Physician Assistant

## 2015-09-12 ENCOUNTER — Other Ambulatory Visit: Payer: Self-pay | Admitting: Cardiovascular Disease

## 2015-09-12 ENCOUNTER — Other Ambulatory Visit: Payer: Self-pay | Admitting: Cardiology

## 2015-09-12 DIAGNOSIS — I739 Peripheral vascular disease, unspecified: Secondary | ICD-10-CM

## 2015-09-12 NOTE — Telephone Encounter (Signed)
REFILL 

## 2015-09-16 ENCOUNTER — Telehealth: Payer: Self-pay | Admitting: Physician Assistant

## 2015-09-16 ENCOUNTER — Ambulatory Visit (HOSPITAL_COMMUNITY)
Admission: RE | Admit: 2015-09-16 | Discharge: 2015-09-16 | Disposition: A | Discharge status: Home / Self Care | Payer: Medicare Other | Source: Ambulatory Visit | Attending: Physician Assistant | Admitting: Physician Assistant

## 2015-09-16 ENCOUNTER — Other Ambulatory Visit: Payer: Self-pay | Admitting: Physician Assistant

## 2015-09-16 DIAGNOSIS — I2510 Atherosclerotic heart disease of native coronary artery without angina pectoris: Secondary | ICD-10-CM | POA: Diagnosis not present

## 2015-09-16 DIAGNOSIS — I70201 Unspecified atherosclerosis of native arteries of extremities, right leg: Secondary | ICD-10-CM | POA: Diagnosis not present

## 2015-09-16 DIAGNOSIS — I7789 Other specified disorders of arteries and arterioles: Secondary | ICD-10-CM | POA: Insufficient documentation

## 2015-09-16 DIAGNOSIS — R938 Abnormal findings on diagnostic imaging of other specified body structures: Secondary | ICD-10-CM | POA: Insufficient documentation

## 2015-09-16 DIAGNOSIS — K219 Gastro-esophageal reflux disease without esophagitis: Secondary | ICD-10-CM | POA: Insufficient documentation

## 2015-09-16 DIAGNOSIS — I10 Essential (primary) hypertension: Secondary | ICD-10-CM | POA: Diagnosis not present

## 2015-09-16 DIAGNOSIS — E785 Hyperlipidemia, unspecified: Secondary | ICD-10-CM | POA: Insufficient documentation

## 2015-09-16 DIAGNOSIS — I739 Peripheral vascular disease, unspecified: Secondary | ICD-10-CM | POA: Insufficient documentation

## 2015-09-16 NOTE — Telephone Encounter (Signed)
Pt saw bryan last Monday and was to have prescription for Cozar called in.  Pharmacy has requested refill twice and still does not have it.  [place] in [place].  Please call in for patient!

## 2015-09-18 ENCOUNTER — Telehealth: Payer: Self-pay

## 2015-09-18 ENCOUNTER — Other Ambulatory Visit: Payer: Self-pay

## 2015-09-18 DIAGNOSIS — Z01818 Encounter for other preprocedural examination: Secondary | ICD-10-CM

## 2015-09-18 DIAGNOSIS — I739 Peripheral vascular disease, unspecified: Secondary | ICD-10-CM

## 2015-09-18 NOTE — Telephone Encounter (Signed)
Pt needs to be scheduled for PV angiogram on 5/11 with Dr. Gwenlyn Found. Left Groin with Rep. Scott.  Pt scheduled on 5/11 at 9am, to arrive at 6:30am for pre-Op lab work. Pt aware he dose NOT needs to hold his Plavix and ASA before procedure. Pt had chest Xray at PCP office on 05/2015, results being faxed to our office.  Scott notified via text, and Pre-Cert Dept notified. Pt aware that he needs to fasting the night before and to prepare for one night stay.

## 2015-09-20 DIAGNOSIS — K209 Esophagitis, unspecified: Secondary | ICD-10-CM | POA: Diagnosis not present

## 2015-09-25 ENCOUNTER — Other Ambulatory Visit: Payer: Self-pay

## 2015-09-25 ENCOUNTER — Encounter (HOSPITAL_COMMUNITY): Payer: Self-pay | Admitting: Emergency Medicine

## 2015-09-25 ENCOUNTER — Emergency Department (HOSPITAL_COMMUNITY)
Admission: EM | Admit: 2015-09-25 | Discharge: 2015-09-25 | Disposition: A | Discharge status: Home / Self Care | Payer: Medicare Other | Attending: Emergency Medicine | Admitting: Emergency Medicine

## 2015-09-25 ENCOUNTER — Emergency Department (HOSPITAL_COMMUNITY): Payer: Medicare Other

## 2015-09-25 DIAGNOSIS — R002 Palpitations: Secondary | ICD-10-CM | POA: Insufficient documentation

## 2015-09-25 DIAGNOSIS — I10 Essential (primary) hypertension: Secondary | ICD-10-CM | POA: Diagnosis not present

## 2015-09-25 DIAGNOSIS — E039 Hypothyroidism, unspecified: Secondary | ICD-10-CM | POA: Insufficient documentation

## 2015-09-25 DIAGNOSIS — I25111 Atherosclerotic heart disease of native coronary artery with angina pectoris with documented spasm: Secondary | ICD-10-CM

## 2015-09-25 DIAGNOSIS — K219 Gastro-esophageal reflux disease without esophagitis: Secondary | ICD-10-CM | POA: Insufficient documentation

## 2015-09-25 DIAGNOSIS — Z9861 Coronary angioplasty status: Secondary | ICD-10-CM | POA: Insufficient documentation

## 2015-09-25 DIAGNOSIS — I252 Old myocardial infarction: Secondary | ICD-10-CM | POA: Insufficient documentation

## 2015-09-25 DIAGNOSIS — I491 Atrial premature depolarization: Secondary | ICD-10-CM | POA: Diagnosis not present

## 2015-09-25 DIAGNOSIS — J449 Chronic obstructive pulmonary disease, unspecified: Secondary | ICD-10-CM | POA: Diagnosis not present

## 2015-09-25 DIAGNOSIS — Z87891 Personal history of nicotine dependence: Secondary | ICD-10-CM | POA: Insufficient documentation

## 2015-09-25 DIAGNOSIS — Z951 Presence of aortocoronary bypass graft: Secondary | ICD-10-CM | POA: Diagnosis not present

## 2015-09-25 DIAGNOSIS — Z79899 Other long term (current) drug therapy: Secondary | ICD-10-CM | POA: Diagnosis not present

## 2015-09-25 DIAGNOSIS — E785 Hyperlipidemia, unspecified: Secondary | ICD-10-CM | POA: Insufficient documentation

## 2015-09-25 DIAGNOSIS — R0602 Shortness of breath: Secondary | ICD-10-CM | POA: Diagnosis not present

## 2015-09-25 DIAGNOSIS — Z9889 Other specified postprocedural states: Secondary | ICD-10-CM | POA: Diagnosis not present

## 2015-09-25 DIAGNOSIS — M1990 Unspecified osteoarthritis, unspecified site: Secondary | ICD-10-CM | POA: Diagnosis not present

## 2015-09-25 DIAGNOSIS — R05 Cough: Secondary | ICD-10-CM | POA: Diagnosis not present

## 2015-09-25 DIAGNOSIS — Z7982 Long term (current) use of aspirin: Secondary | ICD-10-CM | POA: Insufficient documentation

## 2015-09-25 DIAGNOSIS — I25119 Atherosclerotic heart disease of native coronary artery with unspecified angina pectoris: Secondary | ICD-10-CM | POA: Insufficient documentation

## 2015-09-25 LAB — BASIC METABOLIC PANEL
Anion gap: 10 (ref 5–15)
BUN: 13 mg/dL (ref 6–20)
CO2: 23 mmol/L (ref 22–32)
Calcium: 9.3 mg/dL (ref 8.9–10.3)
Chloride: 109 mmol/L (ref 101–111)
Creatinine, Ser: 1.16 mg/dL (ref 0.61–1.24)
GFR calc Af Amer: >60 mL/min (ref >60)
GFR calc non Af Amer: >60 mL/min (ref >60)
Glucose, Bld: 107 mg/dL — ABNORMAL HIGH (ref 65–99)
Potassium: 4.2 mmol/L (ref 3.5–5.1)
Sodium: 142 mmol/L (ref 135–145)

## 2015-09-25 LAB — CBC
HCT: 41.4 % (ref 39.0–52.0)
Hemoglobin: 13.7 g/dL (ref 13.0–17.0)
MCH: 28.6 pg (ref 26.0–34.0)
MCHC: 33.1 g/dL (ref 30.0–36.0)
MCV: 86.4 fL (ref 78.0–100.0)
Platelets: 274 10*3/uL (ref 150–400)
RBC: 4.79 MIL/uL (ref 4.22–5.81)
RDW: 12.8 % (ref 11.5–15.5)
WBC: 8.8 10*3/uL (ref 4.0–10.5)

## 2015-09-25 LAB — I-STAT TROPONIN, ED: Troponin i, poc: 0 ng/mL (ref 0.00–0.08)

## 2015-09-25 NOTE — Consult Note (Signed)
Patient ID: Brian Brock MRN: [id number], DOB/AGE: 1946/03/25   Admit date: 09/25/2015   Primary Physician: [name], MD Primary Cardiologist: Dr. Claiborne Billings  Pt. Profile:  70 y/o male with h/o CAD s/p CABG in 2014 and PCI to naive LAD in 2016, PVD scheduled for PV angio tomorrow with Dr. Gwenlyn Found, Hypothyroidism on levothyroxine, HTN and HLD, but no h/o arrhthymias, presenting to ED with complaint of palpitations.   Problem List  Past Medical History  Diagnosis Date  . Coronary artery disease     Multiple stents  . COPD (chronic obstructive pulmonary disease) ([place])   . Peripheral vascular disease ([place])     prior stenting  . Dyslipidemia   . Hyperlipemia   . Allergic rhinitis   . Hypertension   . Myocardial infarct ([place]) 1997    "mild"  . Anginal pain ([place]) 1997  . Childhood asthma   . Chronic bronchitis ([place])     Yearly  . Hypothyroidism   . GERD (gastroesophageal reflux disease)   . Arthritis     Back    Past Surgical History  Procedure Laterality Date  . Coronary angioplasty with stent placement  '97, '98, '04, '06, '08, '10    seven cardiac stents  . Femoral artery stent Bilateral 7/06  8/08    Lt SFA '06, Rt SFA 8/08  . Laparoscopic cholecystectomy  June 2010  . Left heart catheterization with coronary angiogram N/A 09/07/2012    Procedure: LEFT HEART CATHETERIZATION WITH CORONARY ANGIOGRAM;  Surgeon: Troy Sine, MD;  Location: [facility] CATH LAB;  Service: Cardiovascular;  Laterality: N/A;  . Coronary artery bypass graft N/A 09/12/2012    Procedure: OFF PUMP CORONARY ARTERY BYPASS GRAFTING (CABG);  Surgeon: Melrose Nakayama, MD;  Location: [place];  Service: Open Heart Surgery;  Laterality: N/A;  Times two using left internal mammary artery and endoscopically harvested right saphenous vein  ;LIMA-LAD and SVG -OM1  . Doppler echocardiography  11/23/2012,09/30/2010     LV EF 50-55%----09/30/2010-LV EF = >55%  . Lexiscan stress test  09/06/2012  . Nm myoview  ltd  05/06/2012    EF -STUDY NOT GATED, NORMAL STRESS NUCLEAR ,NORMAL LV FUNCTION  . Lower arterial doppler  03/25/2012    FGT CIA 0-49%; RGT CFA AND SFA AT STENT 0-49%; RGT DISTAL SFA = <50%; LFT. EIA  50-69%; LFT. CFA 0-49% ;;LFT. SFA  AT STENT NORMAL PATENCY  . Carotid doppler  100/24/11    RGT BULB and PROXIMAL ICA 0-49%; LFT. ICA  0-49 ; RGT SUBCLAVIAN ARTERY < 50% ; LFT SUBCLAVIAN normal patency; RGT  and LFT  VERTEBRAL ARTERIES  . Event monitor  11/04/2012-11/17/2012    pt denies this hx on 07/24/2014  . Tonsillectomy  1950's  . Hernia repair  10/2008    "repaired w/gallbladder OR"  . Cardiac catheterization  2004    had remote tandem stents in 1997 and 1998 for LAD lesions, RCA stent in 1997 , midprto=ion, and a proximal RCA  SENT IN 1999, all DES; some were IVIS  guide.  . Coronary angioplasty    . Cardiac catheterization  09/07/2012    demonstrated severe 2- vessel disease,patent LAD,patient circ and patent right stents, but 90% ostial LAD whic was new.  . Left heart catheterization with coronary/graft angiogram N/A 07/24/2014    Procedure: LEFT HEART CATHETERIZATION WITH Beatrix Fetters;  Surgeon: Troy Sine, MD; LAD 70%, 50% ISR, mLAD 99%, CFX 30%, patent stent, RCA patent, LIMA-LAD occluded, SVG-OM 1  patent, EF 50-55%  . Percutaneous coronary stent intervention (pci-s) Right 07/24/2014    Procedure: PERCUTANEOUS CORONARY STENT INTERVENTION (PCI-S);  Surgeon: Troy Sine, MD; ostial LAD PTCA with angioscope scoring balloon, 3.0 x 34 mm Resolute DES extending from the ostium to the mid LAD, covering all significant stenoses      Allergies  Allergies  Allergen Reactions  . Metoprolol Other (See Comments)    Fatigue and malaise  . Fish Oil   . Levofloxacin     Unknown reaction  . Meloxicam     Unknown reaction  . Metaxalone     Unknown reaction  . Niaspan [Niacin]   . Sulfonamide Derivatives     unknown reaction - bad rash  . Tricor [Fenofibrate]      HPI  The patient is a 70 y/o male, followed by Dr. Claiborne Billings, with known CAD s/p CABG by Dr. Roxan Hockey in 08/2012 with an off pump LIMA-LAD and SVG to circumflex marginal vessel. In March 2016, he underwent repeat LHC by Dr. Claiborne Billings. Cath revealed multivessel native CAD was 70% ostial LAD stenosis, 40-50% stenosis in the proximal LAD stent in 1995% stenosis after the stented segment in the native LAD with 60% ostial septal perforating artery stenosis. There was 20 and 30% proximal circumflex stenoses with a widely patent circumflex stent and had a widely patent proximal and mid RCA stents. His LIMA graft was occluded in its midsegment prior to its anastomosis into the LAD. He had widely patent vein graft supplying the distal circumflex marginal vessel. He underwent successful PCI to the LAD at its ostium and in its midsegment treated with angioscope scoring balloon, insertion of a 3.034 mm Resolute DES stent extending from the ostium to the mid LAD, postdilated 3.41 mm with entire region been reduced to 0%.  He also has PVD, followed by Dr. Gwenlyn Found and is scheduled to undergo an abdominal aortogram/ PV angio with Dr. Gwenlyn Found tomorrow, 09/26/15 at 9:00 AM. Additional PMH is notable for hypothyroidism on levothyroxine, HTN and HLD and ongoing tobacco use.  He presents to the [facility] ED with a complaint of tachy palpitations. Symptoms have been occuring off and on for the past 3-4 days. He has monitored his HR at home with his BP cuff. HR has ranged from the 30s to low 100s. He has noticed mild dyspnea, but no chest pain, dizziness, syncope/ near syncope. No exertional symptoms. He denies any excess caffeine or ETOH. No recent medication changes. No OTC meds. No recent fever, chills, n/v/d. He denies any prior h/o arrhthymias. He is on BB therapy with Bystolic. He reports full medication compliance.   K is WNL at 4.2. Mg and TSH not checked. BMP demonstrates normal renal function. No signs of dehydration. CBC is  normal w/o anemia. White count is normal. POC troponin is negative. EKG shows sinus brady with PACs. QT/QTc is stable at 448/458 ms. CXR is unremarkable.    Home Medications  Prior to Admission medications   Medication Sig Start Date End Date Taking? Authorizing Provider  aspirin EC 81 MG tablet Take 81 mg by mouth daily.   Yes Historical Provider, MD  cholestyramine Lucrezia Starch) 4 G packet Take 1 packet by mouth 2 (two) times daily with a meal. 10/20/12  Yes Terance Ice, MD  clopidogrel (PLAVIX) 75 MG tablet Take 1 tablet (75 mg total) by mouth daily. 12/20/14  Yes Troy Sine, MD  isosorbide mononitrate (IMDUR) 30 MG 24 hr tablet Take 0.5 tablets (15 mg total) by  mouth daily. 03/06/15  Yes Troy Sine, MD  levothyroxine (SYNTHROID, LEVOTHROID) 25 MCG tablet Take 25 mcg by mouth daily before breakfast.  07/31/14  Yes Historical Provider, MD  losartan (COZAAR) 100 MG tablet TAKE ONE TABLET BY MOUTH EVERY DAY. 09/12/15  Yes Troy Sine, MD  nebivolol (BYSTOLIC) 5 MG tablet Take 1 tablet (5 mg total) by mouth daily. 09/18/14  Yes Lelon Perla, MD  pantoprazole (PROTONIX) 40 MG tablet Take 1 tablet by mouth 2 (two) times daily.  02/08/14  Yes Historical Provider, MD  rosuvastatin (CRESTOR) 20 MG tablet Take 1 tablet (20 mg total) by mouth daily. 08/07/15  Yes Troy Sine, MD  Albuterol Sulfate (PROAIR RESPICLICK) 123XX123 (90 BASE) MCG/ACT AEPB Inhale 2 puffs into the lungs 4 (four) times daily as needed. Patient taking differently: Inhale 2 puffs into the lungs 4 (four) times daily as needed. For wheezing 04/10/14   Deneise Lever, MD  cetirizine (ZYRTEC) 10 MG tablet Take 10 mg by mouth daily.    Historical Provider, MD  Cyanocobalamin (VITAMIN B-12 PO) Take 1 tablet by mouth daily.    Historical Provider, MD  cyclobenzaprine (FLEXERIL) 10 MG tablet Take 10 mg by mouth 3 (three) times daily as needed for muscle spasms.  05/30/14   Historical Provider, MD  Lactobacillus (FLORAJEN ACIDOPHILUS  PO) Take by mouth daily.    Historical Provider, MD  nabumetone (RELAFEN) 500 MG tablet Take 1 tablet by mouth 2 (two) times daily. Take 1 tab twice daily 11/03/14   Historical Provider, MD  NITROSTAT 0.4 MG SL tablet Place 1 tablet (0.4 mg total) under the tongue every 5 (five) minutes as needed for chest pain. 09/11/15   Brett Canales, PA-C    Family History  Family History  Problem Relation Age of Onset  . Coronary artery disease Father 57  . Heart attack Father   . Coronary artery disease Brother 1    CABG in 2000    Social History  Social History   Social History  . Marital Status: Married    Spouse Name: N/A  . Number of Children: N/A  . Years of Education: N/A   Occupational History  . Not on file.   Social History Main Topics  . Smoking status: Former Smoker -- 0.25 packs/day for 56 years    Types: Cigarettes    Quit date: 07/24/2014  . Smokeless tobacco: Former Systems developer    Types: Chew  . Alcohol Use: 4.2 oz/week    7 Glasses of wine per week  . Drug Use: No  . Sexual Activity: Not Currently   Other Topics Concern  . Not on file   Social History Narrative     Review of Systems General:  No chills, fever, night sweats or weight changes.  Cardiovascular:  No chest pain, dyspnea on exertion, edema, orthopnea, palpitations, paroxysmal nocturnal dyspnea. Dermatological: No rash, lesions/masses Respiratory: No cough, dyspnea Urologic: No hematuria, dysuria Abdominal:   No nausea, vomiting, diarrhea, bright red blood per rectum, melena, or hematemesis Neurologic:  No visual changes, wkns, changes in mental status. All other systems reviewed and are otherwise negative except as noted above.  Physical Exam  Blood pressure 129/59, pulse 44, temperature 98 F (36.7 C), temperature source Oral, resp. rate 18, SpO2 95 %.  General: Pleasant, NAD Psych: Normal affect. Neuro: Alert and oriented X 3. Moves all extremities spontaneously. HEENT: Normal  Neck: Supple  without bruits or JVD. Lungs:  Resp regular and unlabored,  CTA. Heart: RRR no s3, s4, or murmurs. Abdomen: Soft, non-tender, non-distended, BS + x 4.  Extremities: No clubbing, cyanosis or edema. DP/PT/Radials 2+ and equal bilaterally.  Labs  Troponin [facility] Test)  Recent Labs  09/25/15 1109  TROPIPOC 0.00   No results for input(s): CKTOTAL, CKMB, TROPONINI in the last 72 hours. Lab Results  Component Value Date   WBC 8.8 09/25/2015   HGB 13.7 09/25/2015   HCT 41.4 09/25/2015   MCV 86.4 09/25/2015   PLT 274 09/25/2015    Recent Labs Lab 09/25/15 1055  NA 142  K 4.2  CL 109  CO2 23  BUN 13  CREATININE 1.16  CALCIUM 9.3  GLUCOSE 107*   Lab Results  Component Value Date   CHOL 155 09/06/2012   HDL 52 09/06/2012   LDLCALC 67 09/06/2012   TRIG 179* 09/06/2012   No results found for: DDIMER   Radiology/Studies  Dg Chest 2 View  09/25/2015  CLINICAL DATA:  Shortness of breath, cough, congestion x3 days EXAM: CHEST  2 VIEW COMPARISON:  07/18/2014 FINDINGS: Lungs are clear.  No pleural effusion or pneumothorax. The heart is normal in size. Postsurgical changes related to prior CABG. Median sternotomy. Cholecystectomy clips. IMPRESSION: No evidence of acute cardiopulmonary disease. Electronically Signed   By: Julian Hy M.D.   On: 09/25/2015 11:43    ECG  Sinus brady with PACs   ASSESSMENT AND PLAN  1. Palpations: Telemetry currently shows sinus brady with HR in the mid 40s-50s. He is currently asymptomatic. EKG earlier showed PACs. K is WNL. Recommend checking a TSH (on thyroid supplementation for hypothyroidism) and Mg. Renal function is normal as well as blood counts. He denies any exogenous triggers including no excess caffeine or ETOH. He is currently asymptomatic. No further palpitations since arriving to the ED. He is asymptomatic with his bradycardia. No dizziness, syncope/ near syncope. He is on a low dose of BB, 2.5 mg of Bystolic. I don't  believe he will require admission. Would recommend an outpatient monitor to further assess for arrhthymias/ bradycardia.   2. CAD: s/p CABG in 2014 with LIMA-LAD and SVG to circumflex marginal vessel. S/p PCI to native LAD 07/2014. He denies CP. POC troponin is negative. Continue medical therapy. ASA, Plavix, Crestor, Bystolic, Losartan and Imdur.   3. PVD: scheduled for PV angio with Dr. Gwenlyn Found tomorrow, 09/26/15 at 9:00 AM.  4. HTN: BP is controlled on current regimen.  5. HLD: on statin therapy with Crestor.   6. Hypothyroidism: on hormone replacement with levothyroxine. Recommend checking TSH given recent development of tachypalpitations.   7. Tobacco Abuse: smoking cessation advised given CAD and PVD history.    Signed, Lyda Jester, PA-C 09/25/2015, 2:12 PM   Attending Note:   The patient was seen and examined.  Agree with assessment and plan as noted above.  Changes made to the above note as needed.  Pt presents with palpitations  Has had PACs on the monitor here in the ER.  Electrolytes are normal.  No CP, VS are stable  At this point, I think he can safely be discharged from the ER. He is scheduled to return for a PV procedure tomorrow with Dr. Gwenlyn Found .   Thayer Headings, Brooke Bonito., MD, [facility] 09/25/2015, 7:12 PM [address]  [place] Pager [phone]

## 2015-09-25 NOTE — ED Notes (Signed)
PA made aware that patients HR in the low 40's.  Patient is asymptomatic at this time, denying SOB, chest pain, lightheadedness, nausea or any symptoms.

## 2015-09-25 NOTE — ED Notes (Signed)
Pt is in stable condition upon d/c and ambulates from ED. 

## 2015-09-25 NOTE — ED Provider Notes (Signed)
CSN: [id number]     Arrival date & time 09/25/15  1046 History   First MD Initiated Contact with Patient 09/25/15 1156     Chief Complaint  Patient presents with  . Palpitations    HPI   70 YOM with Significant past medical history of CAD, hypertension, hyperlipidemia, hypothyroidism, PVD, ongoing tobacco use presents today with complaints of palpitations. Patient reports that over the last 3-4 days he's had episodes of palpitations. He describes this as a sensation that his heart was beating several times in a row rapidly. He reports that this morning he was significantly worse with continued symptoms, he had some associated shortness of breath with the palpitations. Patient reports he was monitoring his pulse at that time and noted that it was ranging from the 50s up to the 130s. Patient reports taking nitroglycerin this morning. Patient denies any associated chest pain, presently denies any chest pain, shortness of breath, dictations  Patient notes a significant past cardiac history including numerous stents most recently in March 2016. Patient has had coronary artery bypass surgery. Patient is expected to undergo lower extremity vessel stenting tomorrow performed by Dr. Alvester Chou. Patient's cardiologist is Dr. Georgina Peer, most recent office visit on 09/11/2015. Patient reports he's been taking medications as directed. Patient reports that he has 1 Pepsi a day, denies any significant caffeine intake, denies any drugs or alcohol, no current increase in stress.     Past Medical History  Diagnosis Date  . Coronary artery disease     Multiple stents  . COPD (chronic obstructive pulmonary disease) ([place])   . Peripheral vascular disease ([place])     prior stenting  . Dyslipidemia   . Hyperlipemia   . Allergic rhinitis   . Hypertension   . Myocardial infarct ([place]) 1997    "mild"  . Anginal pain ([place]) 1997  . Childhood asthma   . Chronic bronchitis ([place])     Yearly  . Hypothyroidism   . GERD  (gastroesophageal reflux disease)   . Arthritis     Back   Past Surgical History  Procedure Laterality Date  . Coronary angioplasty with stent placement  '97, '98, '04, '06, '08, '10    seven cardiac stents  . Femoral artery stent Bilateral 7/06  8/08    Lt SFA '06, Rt SFA 8/08  . Laparoscopic cholecystectomy  June 2010  . Left heart catheterization with coronary angiogram N/A 09/07/2012    Procedure: LEFT HEART CATHETERIZATION WITH CORONARY ANGIOGRAM;  Surgeon: Troy Sine, MD;  Location: [facility] CATH LAB;  Service: Cardiovascular;  Laterality: N/A;  . Coronary artery bypass graft N/A 09/12/2012    Procedure: OFF PUMP CORONARY ARTERY BYPASS GRAFTING (CABG);  Surgeon: Melrose Nakayama, MD;  Location: [place];  Service: Open Heart Surgery;  Laterality: N/A;  Times two using left internal mammary artery and endoscopically harvested right saphenous vein  ;LIMA-LAD and SVG -OM1  . Doppler echocardiography  11/23/2012,09/30/2010     LV EF 50-55%----09/30/2010-LV EF = >55%  . Lexiscan stress test  09/06/2012  . Nm myoview ltd  05/06/2012    EF -STUDY NOT GATED, NORMAL STRESS NUCLEAR ,NORMAL LV FUNCTION  . Lower arterial doppler  03/25/2012    FGT CIA 0-49%; RGT CFA AND SFA AT STENT 0-49%; RGT DISTAL SFA = <50%; LFT. EIA  50-69%; LFT. CFA 0-49% ;;LFT. SFA  AT STENT NORMAL PATENCY  . Carotid doppler  100/24/11    RGT BULB and PROXIMAL ICA 0-49%; LFT. ICA  0-49 ;  RGT SUBCLAVIAN ARTERY < 50% ; LFT SUBCLAVIAN normal patency; RGT  and LFT  VERTEBRAL ARTERIES  . Event monitor  11/04/2012-11/17/2012    pt denies this hx on 07/24/2014  . Tonsillectomy  1950's  . Hernia repair  10/2008    "repaired w/gallbladder OR"  . Cardiac catheterization  2004    had remote tandem stents in 1997 and 1998 for LAD lesions, RCA stent in 1997 , midprto=ion, and a proximal RCA  SENT IN 1999, all DES; some were IVIS  guide.  . Coronary angioplasty    . Cardiac catheterization  09/07/2012    demonstrated severe 2- vessel  disease,patent LAD,patient circ and patent right stents, but 90% ostial LAD whic was new.  . Left heart catheterization with coronary/graft angiogram N/A 07/24/2014    Procedure: LEFT HEART CATHETERIZATION WITH Beatrix Fetters;  Surgeon: Troy Sine, MD; LAD 70%, 50% ISR, mLAD 99%, CFX 30%, patent stent, RCA patent, LIMA-LAD occluded, SVG-OM 1 patent, EF 50-55%  . Percutaneous coronary stent intervention (pci-s) Right 07/24/2014    Procedure: PERCUTANEOUS CORONARY STENT INTERVENTION (PCI-S);  Surgeon: Troy Sine, MD; ostial LAD PTCA with angioscope scoring balloon, 3.0 x 34 mm Resolute DES extending from the ostium to the mid LAD, covering all significant stenoses    Family History  Problem Relation Age of Onset  . Coronary artery disease Father 39  . Heart attack Father   . Coronary artery disease Brother 7    CABG in 2000   Social History  Substance Use Topics  . Smoking status: Former Smoker -- 0.25 packs/day for 56 years    Types: Cigarettes    Quit date: 07/24/2014  . Smokeless tobacco: Former Systems developer    Types: Chew  . Alcohol Use: 4.2 oz/week    7 Glasses of wine per week    Review of Systems  All other systems reviewed and are negative.   Allergies  Metoprolol; Fish oil; Levofloxacin; Meloxicam; Metaxalone; Niaspan; Sulfonamide derivatives; and Tricor  Home Medications   Prior to Admission medications   Medication Sig Start Date End Date Taking? Authorizing Provider  aspirin EC 81 MG tablet Take 81 mg by mouth daily.   Yes Historical Provider, MD  cholestyramine Lucrezia Starch) 4 G packet Take 1 packet by mouth 2 (two) times daily with a meal. 10/20/12  Yes Terance Ice, MD  clopidogrel (PLAVIX) 75 MG tablet Take 1 tablet (75 mg total) by mouth daily. 12/20/14  Yes Troy Sine, MD  isosorbide mononitrate (IMDUR) 30 MG 24 hr tablet Take 0.5 tablets (15 mg total) by mouth daily. 03/06/15  Yes Troy Sine, MD  levothyroxine (SYNTHROID, LEVOTHROID) 25 MCG tablet  Take 25 mcg by mouth daily before breakfast.  07/31/14  Yes Historical Provider, MD  losartan (COZAAR) 100 MG tablet TAKE ONE TABLET BY MOUTH EVERY DAY. 09/12/15  Yes Troy Sine, MD  nebivolol (BYSTOLIC) 5 MG tablet Take 1 tablet (5 mg total) by mouth daily. 09/18/14  Yes Lelon Perla, MD  pantoprazole (PROTONIX) 40 MG tablet Take 1 tablet by mouth 2 (two) times daily.  02/08/14  Yes Historical Provider, MD  rosuvastatin (CRESTOR) 20 MG tablet Take 1 tablet (20 mg total) by mouth daily. 08/07/15  Yes Troy Sine, MD  Albuterol Sulfate (PROAIR RESPICLICK) 123XX123 (90 BASE) MCG/ACT AEPB Inhale 2 puffs into the lungs 4 (four) times daily as needed. Patient taking differently: Inhale 2 puffs into the lungs 4 (four) times daily as needed. For wheezing 04/10/14  Deneise Lever, MD  cetirizine (ZYRTEC) 10 MG tablet Take 10 mg by mouth daily.    Historical Provider, MD  Cyanocobalamin (VITAMIN B-12 PO) Take 1 tablet by mouth daily.    Historical Provider, MD  cyclobenzaprine (FLEXERIL) 10 MG tablet Take 10 mg by mouth 3 (three) times daily as needed for muscle spasms.  05/30/14   Historical Provider, MD  Lactobacillus (FLORAJEN ACIDOPHILUS PO) Take by mouth daily.    Historical Provider, MD  nabumetone (RELAFEN) 500 MG tablet Take 1 tablet by mouth 2 (two) times daily. Take 1 tab twice daily 11/03/14   Historical Provider, MD  NITROSTAT 0.4 MG SL tablet Place 1 tablet (0.4 mg total) under the tongue every 5 (five) minutes as needed for chest pain. 09/11/15   Brett Canales, PA-C   BP 134/96 mmHg  Pulse 56  Temp(Src) 98 F (36.7 C) (Oral)  Resp 24  SpO2 96%   Physical Exam  Constitutional: He is oriented to person, place, and time. He appears well-developed and well-nourished.  HENT:  Head: Normocephalic and atraumatic.  Eyes: Conjunctivae are normal. Pupils are equal, round, and reactive to light. Right eye exhibits no discharge. Left eye exhibits no discharge. No scleral icterus.  Neck: Normal  range of motion. No JVD present. No tracheal deviation present.  Cardiovascular: Regular rhythm and normal heart sounds.   Pulmonary/Chest: Effort normal. No stridor.  Neurological: He is alert and oriented to person, place, and time. Coordination normal.  Skin: Skin is warm and dry. No rash noted. No erythema. No pallor.  Psychiatric: He has a normal mood and affect. His behavior is normal. Judgment and thought content normal.  Nursing note and vitals reviewed.   ED Course  Procedures (including critical care time) Labs Review Labs Reviewed  BASIC METABOLIC PANEL - Abnormal; Notable for the following:    Glucose, Bld 107 (*)    All other components within normal limits  CBC  I-STAT TROPOININ, ED    Imaging Review Dg Chest 2 View  09/25/2015  CLINICAL DATA:  Shortness of breath, cough, congestion x3 days EXAM: CHEST  2 VIEW COMPARISON:  07/18/2014 FINDINGS: Lungs are clear.  No pleural effusion or pneumothorax. The heart is normal in size. Postsurgical changes related to prior CABG. Median sternotomy. Cholecystectomy clips. IMPRESSION: No evidence of acute cardiopulmonary disease. Electronically Signed   By: Julian Hy M.D.   On: 09/25/2015 11:43   I have personally reviewed and evaluated these images and lab results as part of my medical decision-making.   EKG Interpretation   Date/Time:  Wednesday Sep 25 2015 10:49:20 EDT Ventricular Rate:  63 PR Interval:  152 QRS Duration: 86 QT Interval:  448 QTC Calculation: 458 R Axis:   80 Text Interpretation:  Sinus bradycardia with Premature atrial complexes  Nonspecific T wave abnormality Abnormal ECG Confirmed by Lita Mains  MD,  DAVID ([id number]) on 09/25/2015 12:58:44 PM      MDM   Final diagnoses:  Palpitation    Labs: I-STAT troponin, BNP, CBC  Imaging: DG chest 2 view- No acute findings  Consults:  Therapeutics:  Discharge Meds:   Assessment/Plan: 70 year old male presents today with complaints of  palpitations. Patient's symptoms were more severe this morning, he was found to have PACs while here in the ED, but remains symptomatic. No obvious organic cause of PACs. Patient has extensive cardiac history and is scheduled for an arteriogram with Dr. Alvester Chou tomorrow. Due to patient's significant past medical history cardiology was consult at  who recommended outpatient ambulatory monitor. Patient has remained asymptomatic, he will be discharged home with strict return precautions, encouraged to follow up with cardiology as previously scheduled. Patient verbalized understanding and agreement to today's plan had no further questions or concerns at time of discharge        Okey Regal, PA-C 09/25/15 1714  Julianne Rice, MD 09/25/15 [phone]

## 2015-09-25 NOTE — ED Notes (Signed)
Woke up and around 8:00 was having palpitations that progressively got worse but ongoing x 3-4 days.  Checked pulse at home with pulses ranging from 30's to 110's

## 2015-09-25 NOTE — ED Notes (Signed)
Pt reports intermittent palpitations x 3 days worsening this morning. Pt alert x4. NAD at this time. Pt denies CP.

## 2015-09-25 NOTE — Discharge Instructions (Signed)
Please follow up with her cardiologist for further evaluation and management. Please return immediately to the emergency room if any new or worsening signs or symptoms present.

## 2015-09-26 ENCOUNTER — Encounter (HOSPITAL_COMMUNITY): Payer: Self-pay | Admitting: Cardiovascular Disease

## 2015-09-26 ENCOUNTER — Ambulatory Visit (HOSPITAL_COMMUNITY)
Admission: RE | Admit: 2015-09-26 | Discharge: 2015-09-27 | Disposition: A | Discharge status: Home / Self Care | Payer: Medicare Other | Source: Ambulatory Visit | Attending: Cardiovascular Disease | Admitting: Cardiovascular Disease

## 2015-09-26 ENCOUNTER — Encounter (HOSPITAL_COMMUNITY)
Admission: RE | Disposition: A | Discharge status: Home / Self Care | Payer: Self-pay | Source: Ambulatory Visit | Attending: Cardiovascular Disease

## 2015-09-26 DIAGNOSIS — I480 Paroxysmal atrial fibrillation: Secondary | ICD-10-CM | POA: Diagnosis not present

## 2015-09-26 DIAGNOSIS — I70212 Atherosclerosis of native arteries of extremities with intermittent claudication, left leg: Secondary | ICD-10-CM | POA: Diagnosis not present

## 2015-09-26 DIAGNOSIS — J449 Chronic obstructive pulmonary disease, unspecified: Secondary | ICD-10-CM | POA: Insufficient documentation

## 2015-09-26 DIAGNOSIS — Z7902 Long term (current) use of antithrombotics/antiplatelets: Secondary | ICD-10-CM | POA: Diagnosis not present

## 2015-09-26 DIAGNOSIS — Z8249 Family history of ischemic heart disease and other diseases of the circulatory system: Secondary | ICD-10-CM | POA: Insufficient documentation

## 2015-09-26 DIAGNOSIS — T82856A Stenosis of peripheral vascular stent, initial encounter: Secondary | ICD-10-CM | POA: Insufficient documentation

## 2015-09-26 DIAGNOSIS — Z7982 Long term (current) use of aspirin: Secondary | ICD-10-CM | POA: Insufficient documentation

## 2015-09-26 DIAGNOSIS — K219 Gastro-esophageal reflux disease without esophagitis: Secondary | ICD-10-CM | POA: Insufficient documentation

## 2015-09-26 DIAGNOSIS — Z87891 Personal history of nicotine dependence: Secondary | ICD-10-CM | POA: Diagnosis not present

## 2015-09-26 DIAGNOSIS — Y812 Prosthetic and other implants, materials and accessory general- and plastic-surgery devices associated with adverse incidents: Secondary | ICD-10-CM | POA: Diagnosis not present

## 2015-09-26 DIAGNOSIS — I10 Essential (primary) hypertension: Secondary | ICD-10-CM | POA: Insufficient documentation

## 2015-09-26 DIAGNOSIS — I2510 Atherosclerotic heart disease of native coronary artery without angina pectoris: Secondary | ICD-10-CM | POA: Diagnosis not present

## 2015-09-26 DIAGNOSIS — Z951 Presence of aortocoronary bypass graft: Secondary | ICD-10-CM | POA: Insufficient documentation

## 2015-09-26 DIAGNOSIS — I739 Peripheral vascular disease, unspecified: Secondary | ICD-10-CM

## 2015-09-26 DIAGNOSIS — M1990 Unspecified osteoarthritis, unspecified site: Secondary | ICD-10-CM | POA: Diagnosis not present

## 2015-09-26 DIAGNOSIS — I252 Old myocardial infarction: Secondary | ICD-10-CM | POA: Insufficient documentation

## 2015-09-26 DIAGNOSIS — I70213 Atherosclerosis of native arteries of extremities with intermittent claudication, bilateral legs: Secondary | ICD-10-CM | POA: Insufficient documentation

## 2015-09-26 DIAGNOSIS — J309 Allergic rhinitis, unspecified: Secondary | ICD-10-CM | POA: Diagnosis not present

## 2015-09-26 DIAGNOSIS — E039 Hypothyroidism, unspecified: Secondary | ICD-10-CM | POA: Insufficient documentation

## 2015-09-26 DIAGNOSIS — E785 Hyperlipidemia, unspecified: Secondary | ICD-10-CM | POA: Diagnosis not present

## 2015-09-26 HISTORY — PX: PERIPHERAL VASCULAR CATHETERIZATION: SHX172C

## 2015-09-26 LAB — PROTIME-INR
INR: 1.04 (ref 0.00–1.49)
Prothrombin Time: 13.8 seconds (ref 11.6–15.2)

## 2015-09-26 LAB — POCT ACTIVATED CLOTTING TIME
Activated Clotting Time: 219 seconds
Activated Clotting Time: 162 seconds

## 2015-09-26 LAB — TSH: TSH: 4.375 u[IU]/mL (ref 0.350–4.500)

## 2015-09-26 SURGERY — ABDOMINAL AORTOGRAM W/LOWER EXTREMITY

## 2015-09-26 MED ORDER — HYDRALAZINE HCL 20 MG/ML IJ SOLN: 10.0000 mg | INTRAMUSCULAR | Status: DC | PRN

## 2015-09-26 MED ORDER — IODIXANOL 320 MG/ML IV SOLN
INTRAVENOUS | Status: DC | PRN
  Administered 2015-09-26: 210 mL via INTRA_ARTERIAL

## 2015-09-26 MED ORDER — SODIUM CHLORIDE 0.9% FLUSH: 3.0000 mL | INTRAVENOUS | Status: DC | PRN

## 2015-09-26 MED ORDER — CHOLESTYRAMINE 4 G PO PACK
1.0000 | PACK | Freq: Two times a day (BID) | ORAL | Status: DC
  Administered 2015-09-26 – 2015-09-27 (×2): 1 via ORAL
  Filled 2015-09-26 (×2): qty 1

## 2015-09-26 MED ORDER — PANTOPRAZOLE SODIUM 40 MG PO TBEC
40.0000 mg | DELAYED_RELEASE_TABLET | Freq: Two times a day (BID) | ORAL | Status: DC
  Administered 2015-09-26 – 2015-09-27 (×2): 40 mg via ORAL
  Filled 2015-09-26 (×2): qty 1

## 2015-09-26 MED ORDER — HEPARIN SODIUM (PORCINE) 1000 UNIT/ML IJ SOLN
INTRAMUSCULAR | Status: AC
  Filled 2015-09-26: qty 1

## 2015-09-26 MED ORDER — HEPARIN (PORCINE) IN NACL 2-0.9 UNIT/ML-% IJ SOLN
INTRAMUSCULAR | Status: AC
  Filled 2015-09-26: qty 500

## 2015-09-26 MED ORDER — ISOSORBIDE MONONITRATE ER 30 MG PO TB24
15.0000 mg | ORAL_TABLET | Freq: Every day | ORAL | Status: DC
  Administered 2015-09-26 – 2015-09-27 (×2): 15 mg via ORAL
  Filled 2015-09-26 (×2): qty 1

## 2015-09-26 MED ORDER — SODIUM CHLORIDE 0.9 % WEIGHT BASED INFUSION: 1.0000 mL/kg/h | INTRAVENOUS | Status: DC

## 2015-09-26 MED ORDER — NITROGLYCERIN 1 MG/10 ML FOR IR/CATH LAB
INTRA_ARTERIAL | Status: DC | PRN
  Administered 2015-09-26: 11:00:00

## 2015-09-26 MED ORDER — ASPIRIN 81 MG PO CHEW
81.0000 mg | CHEWABLE_TABLET | ORAL | Status: AC
  Administered 2015-09-26: 81 mg via ORAL

## 2015-09-26 MED ORDER — FENTANYL CITRATE (PF) 100 MCG/2ML IJ SOLN
INTRAMUSCULAR | Status: AC
  Filled 2015-09-26: qty 2

## 2015-09-26 MED ORDER — ROSUVASTATIN CALCIUM 20 MG PO TABS
20.0000 mg | ORAL_TABLET | Freq: Every day | ORAL | Status: DC
  Administered 2015-09-26 – 2015-09-27 (×2): 20 mg via ORAL
  Filled 2015-09-26 (×2): qty 1

## 2015-09-26 MED ORDER — NITROGLYCERIN 1 MG/10 ML FOR IR/CATH LAB
INTRA_ARTERIAL | Status: DC | PRN
  Administered 2015-09-26: 200 ug via INTRA_ARTERIAL

## 2015-09-26 MED ORDER — MIDAZOLAM HCL 2 MG/2ML IJ SOLN
INTRAMUSCULAR | Status: AC
  Filled 2015-09-26: qty 2

## 2015-09-26 MED ORDER — LEVOTHYROXINE SODIUM 50 MCG PO TABS
25.0000 ug | ORAL_TABLET | Freq: Every day | ORAL | Status: DC
  Administered 2015-09-27: 25 ug via ORAL
  Filled 2015-09-26: qty 1

## 2015-09-26 MED ORDER — LOSARTAN POTASSIUM 50 MG PO TABS
100.0000 mg | ORAL_TABLET | Freq: Every day | ORAL | Status: DC
  Administered 2015-09-26 – 2015-09-27 (×2): 100 mg via ORAL
  Filled 2015-09-26 (×2): qty 2

## 2015-09-26 MED ORDER — LORATADINE 10 MG PO TABS
10.0000 mg | ORAL_TABLET | Freq: Every day | ORAL | Status: DC
Start: 2015-09-26 — End: 2015-09-27
  Administered 2015-09-26 – 2015-09-27 (×2): 10 mg via ORAL
  Filled 2015-09-26 (×2): qty 1

## 2015-09-26 MED ORDER — ANGIOPLASTY BOOK
Freq: Once | Status: AC
  Administered 2015-09-26: 23:00:00
  Filled 2015-09-26: qty 1

## 2015-09-26 MED ORDER — CLOPIDOGREL BISULFATE 75 MG PO TABS
75.0000 mg | ORAL_TABLET | Freq: Every day | ORAL | Status: DC
  Administered 2015-09-26: 75 mg via ORAL

## 2015-09-26 MED ORDER — NABUMETONE 500 MG PO TABS
500.0000 mg | ORAL_TABLET | Freq: Two times a day (BID) | ORAL | Status: DC
  Filled 2015-09-26 (×2): qty 1

## 2015-09-26 MED ORDER — LIDOCAINE HCL (PF) 1 % IJ SOLN
INTRAMUSCULAR | Status: AC
  Filled 2015-09-26: qty 30

## 2015-09-26 MED ORDER — ONDANSETRON HCL 4 MG/2ML IJ SOLN: 4.0000 mg | Freq: Four times a day (QID) | INTRAMUSCULAR | Status: DC | PRN

## 2015-09-26 MED ORDER — ASPIRIN 81 MG PO CHEW
CHEWABLE_TABLET | ORAL | Status: AC
  Administered 2015-09-26: 08:00:00
  Filled 2015-09-26: qty 1

## 2015-09-26 MED ORDER — HEPARIN SODIUM (PORCINE) 1000 UNIT/ML IJ SOLN
INTRAMUSCULAR | Status: DC | PRN
  Administered 2015-09-26: 6000 [IU] via INTRAVENOUS
  Administered 2015-09-26: 3000 [IU] via INTRAVENOUS

## 2015-09-26 MED ORDER — ASPIRIN EC 81 MG PO TBEC: 81.0000 mg | DELAYED_RELEASE_TABLET | Freq: Every day | ORAL | Status: DC

## 2015-09-26 MED ORDER — CYCLOBENZAPRINE HCL 10 MG PO TABS: 10.0000 mg | ORAL_TABLET | Freq: Three times a day (TID) | ORAL | Status: DC | PRN

## 2015-09-26 MED ORDER — NITROGLYCERIN 1 MG/10 ML FOR IR/CATH LAB
INTRA_ARTERIAL | Status: AC
  Filled 2015-09-26: qty 10

## 2015-09-26 MED ORDER — NITROGLYCERIN 0.4 MG SL SUBL: 0.4000 mg | SUBLINGUAL_TABLET | SUBLINGUAL | Status: DC | PRN

## 2015-09-26 MED ORDER — SODIUM CHLORIDE 0.9 % IV SOLN
INTRAVENOUS | Status: AC
  Administered 2015-09-26: 12:00:00 via INTRAVENOUS

## 2015-09-26 MED ORDER — CLOPIDOGREL BISULFATE 75 MG PO TABS: 75.0000 mg | ORAL_TABLET | Freq: Every day | ORAL | Status: DC

## 2015-09-26 MED ORDER — MIDAZOLAM HCL 2 MG/2ML IJ SOLN
INTRAMUSCULAR | Status: DC | PRN
  Administered 2015-09-26: 1 mg via INTRAVENOUS

## 2015-09-26 MED ORDER — ASPIRIN EC 325 MG PO TBEC
325.0000 mg | DELAYED_RELEASE_TABLET | Freq: Every day | ORAL | Status: DC
  Administered 2015-09-27: 10:00:00 325 mg via ORAL
  Filled 2015-09-26: qty 1

## 2015-09-26 MED ORDER — CLOPIDOGREL BISULFATE 75 MG PO TABS
ORAL_TABLET | ORAL | Status: AC
  Administered 2015-09-26: 08:00:00
  Filled 2015-09-26: qty 1

## 2015-09-26 MED ORDER — SODIUM CHLORIDE 0.9 % WEIGHT BASED INFUSION
3.0000 mL/kg/h | INTRAVENOUS | Status: DC
  Administered 2015-09-26: 3 mL/kg/h via INTRAVENOUS

## 2015-09-26 MED ORDER — CLOPIDOGREL BISULFATE 75 MG PO TABS
75.0000 mg | ORAL_TABLET | Freq: Every day | ORAL | Status: DC
  Administered 2015-09-27: 08:00:00 75 mg via ORAL
  Filled 2015-09-26: qty 1

## 2015-09-26 MED ORDER — FENTANYL CITRATE (PF) 100 MCG/2ML IJ SOLN
INTRAMUSCULAR | Status: DC | PRN
  Administered 2015-09-26: 25 ug via INTRAVENOUS

## 2015-09-26 MED ORDER — ACETAMINOPHEN 325 MG PO TABS: 650.0000 mg | ORAL_TABLET | ORAL | Status: DC | PRN

## 2015-09-26 MED ORDER — NEBIVOLOL HCL 5 MG PO TABS
5.0000 mg | ORAL_TABLET | Freq: Every day | ORAL | Status: DC
  Administered 2015-09-27: 10:00:00 5 mg via ORAL
  Filled 2015-09-26: qty 1

## 2015-09-26 SURGICAL SUPPLY — 20 items
BALLN ARMADA 4X20X135 (BALLOONS) ×2
BALLN ARMADA 7X20X80 (BALLOONS) ×2
BALLOON ARMADA 4X20X135 (BALLOONS) ×1 IMPLANT
BALLOON ARMADA 7X20X80 (BALLOONS) ×1 IMPLANT
CATH ANGIO 5F PIGTAIL 65CM (CATHETERS) ×2 IMPLANT
CATH CROSS OVER TEMPO 5F (CATHETERS) ×2 IMPLANT
CATH STRAIGHT 5FR 65CM (CATHETERS) ×2 IMPLANT
KIT ENCORE 26 ADVANTAGE (KITS) ×2 IMPLANT
KIT PV (KITS) ×2 IMPLANT
SHEATH BRITE TIP 7FR 35CM (SHEATH) ×2 IMPLANT
SHEATH PINNACLE 5F 10CM (SHEATH) ×2 IMPLANT
SHEATH PINNACLE 7F 10CM (SHEATH) ×2 IMPLANT
STENT ABSOLUTE 10X40X135 (Permanent Stent) ×2 IMPLANT
SYRINGE MEDRAD AVANTA MACH 7 (SYRINGE) ×2 IMPLANT
TAPE RADIOPAQUE TURBO (MISCELLANEOUS) ×2 IMPLANT
TRANSDUCER W/STOPCOCK (MISCELLANEOUS) ×2 IMPLANT
TRAY PV CATH (CUSTOM PROCEDURE TRAY) ×2 IMPLANT
TUBING CIL FLEX 10 FLL-RA (TUBING) ×2 IMPLANT
WIRE HITORQ VERSACORE ST 145CM (WIRE) ×2 IMPLANT
WIRE VERSACORE LOC 115CM (WIRE) ×2 IMPLANT

## 2015-09-26 NOTE — Discharge Planning (Signed)
No review due to OIB status.  

## 2015-09-26 NOTE — Interval H&P Note (Signed)
History and Physical Interval Note:  09/26/2015 9:43 AM  Brian Brock  has presented today for surgery, with the diagnosis of claudication  The various methods of treatment have been discussed with the patient and family. After consideration of risks, benefits and other options for treatment, the patient has consented to  Procedure(s): Abdominal Aortogram w/Lower Extremity (N/A) as a surgical intervention .  The patient's history has been reviewed, patient examined, no change in status, stable for surgery.  I have reviewed the patient's chart and labs.  Questions were answered to the patient's satisfaction.     [name]

## 2015-09-26 NOTE — H&P (View-Only) (Signed)
Patient ID: Brian Brock, male   DOB: 1945-05-23, 70 y.o.   MRN: [id number]    Date:  09/11/2015   ID:  Brian Brock, DOB 05-23-1945, MRN [id number]  PCP:  Brian Broker, MD  Primary Cardiologist:  Brian Brock  Chief Complaint  Patient presents with  . Follow-up    Occ sensation in Lt chest, occ shortness of breath, Pain in both lower legs with walking,      History of Present Illness: Brian Brock is a 70 y.o. male with known CAD, hypertension, hyperlipidemia, hypothyroidism, peripheral vascular disease,probable metabolic syndrome and ongoing tobacco use. He has undergone prior percutaneous coronary interventions with stenting to his LAD and RCA in 1997, 1998, and in 2004 to his proximal left circumflex coronary artery. He also has established PVD and underwent stenting to his left SFA in 2004 and right SFA in 2008. There is a history of tobacco use, hyperlipidemia and in April 2014 he presented to [facility] with chest pain and T wave abnormalities. A nuclear perfusion study demonstrated reversible ischemia in the apical segment of the anterior wall. He underwent repeat cardiac catheterization on 09/07/2012 by me and was found to have preserved LV function with minimal focal region of mild inferior hypocontractility. He had significant progression of CAD with ostial 90% eccentric stenosis of the LAD extending into the distal left main proximal to a previously placed proximal LAD stent. There also is 90% stenosis of the LAD after the stented segment. The circumflex vessel is 60% ostial stenosis followed by 60% proximal stenosis prior to the previously placed stent. The RCA stent was patent. CABG surgery was recommended and this was done by Dr. Roxan Brock on 09/12/2012 with an off-pump LIMA to LAD and SVG to circumflex marginal 1 vessel.  He underwent rotator cuff surgery. He tolerated this well. He does have a history of peripheral vascular disease and is status post right common  femoral and proximal SFA stenting with left SFA stenting. A one-year followup duplex study on 04/18/2013 demonstrated ABIs were excellent bilaterally at 1.1 with less than 49% diameter reduction noted in the right common iliac, right common femoral and SFA stent, and was less than 50% diameter reduction in the right distal SFA. There was 50-69% diameter reduction in the left external iliac artery and less than 49% and left common femoral. The left SFA stent was widely patent.  In February 2016 he developed an episode of increasing shortness of breath associated with diaphoresis. Two weeks later in the cold weather he developed severe chest tightness with shortness of breath and blood pressure elevation to 99991111 systolically. He was seen by me on 07/06/2014 after he had again developed some chest pain while moving 40 pounds of bags of salt that he had put in a wheelbarrow. A nuclear perfusion study which was done on 07/13/2014. This was significantly changed from his prior study and now was felt to be high risk with a large severe partially reversible anterior apical defect consistent with prior apical infarct, but now with evidence for severe ischemia in the distal anterior wall and apex. There was apical hypokinesis. Ejection fraction was 52%. He was started on nitrates. Dr. Claiborne Brock recommended definitive cardiac catheterization and on 07/24/2014. This was performed revealed multivessel native CAD was 70% ostial LAD stenosis, 40-50% stenosis in the proximal LAD stent in 1995% stenosis after the stented segment in the native LAD with 60% ostial septal perforating artery stenosis. There was 20 and 30% proximal circumflex stenoses with a  widely patent circumflex stent and had a widely patent proximal and mid RCA stents. His LIMA graft was occluded in its midsegment prior to its anastomosis into the LAD. He had widely patent vein graft supplying the distal circumflex marginal vessel. He underwent  successful PCI to the LAD at its ostium and in its midsegment treated with angioscope scoring balloon, insertion of a 3.034 mm Resolute DES stent extending from the ostium to the mid LAD, postdilated 3.41 mm with entire region been reduced to 0%.  [name] presents for his six-month evaluation. He reports doing well.  He's having an infrequent "feeling" in his chest which is nothing like the pain he experienced in the past when he needed intervention. He just got back from a week camping at the [place]. He had a great time. He and his wife are currently building a townhome in [place] and hoping to move into it at the end of May. He does report that he can walk about 5-6 minutes before having severe pain in his lower extremities. It's worse in the right leg compared to left.  The patient currently denies nausea, vomiting, fever, chest pain, shortness of breath, orthopnea, dizziness, PND, cough, congestion, abdominal pain, hematochezia, melena, lower extremity edema,   Wt Readings from Last 3 Encounters:  09/11/15 202 lb (91.627 kg)  03/07/15 195 lb 12.8 oz (88.814 kg)  12/20/14 199 lb 2 oz (90.323 kg)     Past Medical History  Diagnosis Date  . Coronary artery disease     Multiple stents  . COPD (chronic obstructive pulmonary disease) ([place])   . Peripheral vascular disease ([place])     prior stenting  . Dyslipidemia   . Hyperlipemia   . Allergic rhinitis   . Hypertension   . Myocardial infarct ([place]) 1997    "mild"  . Anginal pain ([place]) 1997  . Childhood asthma   . Chronic bronchitis ([place])     Yearly  . Hypothyroidism   . GERD (gastroesophageal reflux disease)   . Arthritis     Back    Current Outpatient Prescriptions  Medication Sig Dispense Refill  . Albuterol Sulfate (PROAIR RESPICLICK) 123XX123 (90 BASE) MCG/ACT AEPB Inhale 2 puffs into the lungs 4 (four) times daily as needed. 1 each prn  . aspirin EC 81 MG tablet Take 81 mg by mouth daily.    . cetirizine (ZYRTEC) 10 MG tablet  Take 10 mg by mouth daily.    . cholestyramine (QUESTRAN) 4 G packet Take 1 packet by mouth 2 (two) times daily with a meal. 60 each 11  . clopidogrel (PLAVIX) 75 MG tablet Take 1 tablet (75 mg total) by mouth daily. 90 tablet 3  . Cyanocobalamin (VITAMIN B-12 PO) Take 1 tablet by mouth daily.    . cyclobenzaprine (FLEXERIL) 10 MG tablet Take 10 mg by mouth 3 (three) times daily as needed for muscle spasms.     . isosorbide mononitrate (IMDUR) 30 MG 24 hr tablet Take 0.5 tablets (15 mg total) by mouth daily. 15 tablet 10  . Lactobacillus (FLORAJEN ACIDOPHILUS PO) Take by mouth daily.    [name] levothyroxine (SYNTHROID, LEVOTHROID) 25 MCG tablet Take 25 mcg by mouth daily before breakfast.     . losartan (COZAAR) 100 MG tablet Take 1 tablet (100 mg total) by mouth daily. <PLEASE MAKE APPOINTMENT FOR REFILLS> 15 tablet 0  . nabumetone (RELAFEN) 500 MG tablet Take 1 tablet by mouth 2 (two) times daily. Take 1 tab twice daily    .  nebivolol (BYSTOLIC) 5 MG tablet Take 1 tablet (5 mg total) by mouth daily. 30 tablet 6  . NITROSTAT 0.4 MG SL tablet Place 1 tablet (0.4 mg total) under the tongue every 5 (five) minutes as needed for chest pain. 25 tablet 3  . pantoprazole (PROTONIX) 40 MG tablet Take 1 tablet by mouth 2 (two) times daily.     . rosuvastatin (CRESTOR) 20 MG tablet Take 1 tablet (20 mg total) by mouth daily. 90 tablet 0   No current facility-administered medications for this visit.    Allergies:    Allergies  Allergen Reactions  . Metoprolol Other (See Comments)    Fatigue and malaise  . Fish Oil   . Levofloxacin     Unknown reaction  . Meloxicam     Unknown reaction  . Metaxalone     Unknown reaction  . Niaspan [Niacin]   . Sulfonamide Derivatives     unknown reaction - bad rash  . Tricor [Fenofibrate]     Social History:  The patient  reports that he quit smoking about 13 months ago. His smoking use included Cigarettes. He has a 14 pack-year smoking history. He has quit using  smokeless tobacco. His smokeless tobacco use included Chew. He reports that he drinks about 4.2 oz of alcohol per week. He reports that he does not use illicit drugs.   Family history:   Family History  Problem Relation Age of Onset  . Coronary artery disease Father 25  . Heart attack Father   . Coronary artery disease Brother 22    CABG in 2000    ROS:  Please see the history of present illness.  All other systems reviewed and negative.   PHYSICAL EXAM: VS:  BP 130/60 mmHg  Pulse 56  Ht 5\' 9"  (1.753 m)  Wt 202 lb (91.627 kg)  BMI 29.82 kg/m2 Obese, well developed, in no acute distress HEENT: Pupils are equal round react to light accommodation extraocular movements are intact.  Neck: no JVDNo cervical lymphadenopathy. Cardiac: Regular rate and rhythm without murmurs rubs or gallops. Lungs:  clear to auscultation bilaterally, no wheezing, rhonchi or rales Abd: soft, nontender, positive bowel sounds all quadrants, no hepatosplenomegaly Ext: no lower extremity edema.  2+ radial and 0 left dorsalis pedis and PT pulses. 1+ right PT pulse Skin: warm and dry Neuro:  Grossly normal  Lipid Panel     Component Value Date/Time   CHOL 155 09/06/2012 0455   TRIG 179* 09/06/2012 0455   HDL 52 09/06/2012 0455   CHOLHDL 3.0 09/06/2012 0455   VLDL 36 09/06/2012 0455   LDLCALC 67 09/06/2012 0455      ASSESSMENT AND PLAN:  Problem List Items Addressed This Visit    PVD- Lt SFA stent '04, Rt SFA stent '08 (Chronic)   Relevant Medications   NITROSTAT 0.4 MG SL tablet   RESOLVED: Peripheral vascular disease ([place]) - Primary   Relevant Medications   NITROSTAT 0.4 MG SL tablet   Other Relevant Orders   VAS [place] LOWER EXTREMITY ARTERIAL DUPLEX   PAF (paroxysmal atrial fibrillation) (HCC)   Relevant Medications   NITROSTAT 0.4 MG SL tablet   Hyperlipidemia (Chronic)   Relevant Medications   NITROSTAT 0.4 MG SL tablet   HTN (hypertension)   Relevant Medications   NITROSTAT 0.4 MG SL  tablet   Claudication (HCC)   CAD- CABG X 17 August 2012 (Chronic)   Relevant Medications   NITROSTAT 0.4 MG SL tablet  Overall Mr. Gerrior appears to be doing well. He's not experiencing any angina and is able to do most of his normal activities.  He does describe lower extremity pain, right greater than left, after about 5-6 minutes of walking. He does have very poor distal pulses however, his feet are warm. He has known peripheral vascular disease and his last set of lower arterial Dopplers were in 2014. I have ordered repeat Dopplers today.  Blood pressure is well-controlled. I'll continue aspirin, Plavix, imdur 15, by bystolic 5 mg Crestor 20 mg. We refilled his Cozaar.  FU in 6 months with Dr. Claiborne Brock.

## 2015-09-26 NOTE — Progress Notes (Signed)
Site area: left groin  Site Prior to Removal:  Level 0  Pressure Applied For 20 MINUTES    Minutes Beginning at 1340  Manual:   Yes.    Patient Status During Pull:  stable  Post Pull Groin Site:  Level 0  Post Pull Instructions Given:  Yes.    Post Pull Pulses Present:  Yes.    Dressing Applied:  Yes.    Comments:

## 2015-09-27 ENCOUNTER — Other Ambulatory Visit: Payer: Self-pay | Admitting: Cardiology

## 2015-09-27 DIAGNOSIS — I739 Peripheral vascular disease, unspecified: Secondary | ICD-10-CM

## 2015-09-27 DIAGNOSIS — I70213 Atherosclerosis of native arteries of extremities with intermittent claudication, bilateral legs: Secondary | ICD-10-CM | POA: Diagnosis not present

## 2015-09-27 LAB — CBC
HCT: 37.9 % — ABNORMAL LOW (ref 39.0–52.0)
Hemoglobin: 12.5 g/dL — ABNORMAL LOW (ref 13.0–17.0)
MCH: 27.7 pg (ref 26.0–34.0)
MCHC: 33 g/dL (ref 30.0–36.0)
MCV: 83.8 fL (ref 78.0–100.0)
Platelets: 261 10*3/uL (ref 150–400)
RBC: 4.52 MIL/uL (ref 4.22–5.81)
RDW: 12.7 % (ref 11.5–15.5)
WBC: 7.8 10*3/uL (ref 4.0–10.5)

## 2015-09-27 LAB — BASIC METABOLIC PANEL
Anion gap: 13 (ref 5–15)
BUN: 15 mg/dL (ref 6–20)
CO2: 24 mmol/L (ref 22–32)
Calcium: 8.7 mg/dL — ABNORMAL LOW (ref 8.9–10.3)
Chloride: 105 mmol/L (ref 101–111)
Creatinine, Ser: 1.12 mg/dL (ref 0.61–1.24)
GFR calc Af Amer: >60 mL/min (ref >60)
GFR calc non Af Amer: >60 mL/min (ref >60)
Glucose, Bld: 85 mg/dL (ref 65–99)
Potassium: 3.9 mmol/L (ref 3.5–5.1)
Sodium: 142 mmol/L (ref 135–145)

## 2015-09-27 MED ORDER — NEBIVOLOL HCL 5 MG PO TABS: 2.5000 mg | ORAL_TABLET | Freq: Every day | ORAL | Status: DC

## 2015-09-27 NOTE — Discharge Summary (Signed)
Discharge Summary    Patient ID: Brian Brock,  MRN: [id number], DOB/AGE: 70-Feb-1947 70 y.o.  Admit date: 09/26/2015 Discharge date: 09/27/2015  Primary Care Provider: ROBBINS,ROBERT A Primary Cardiologist: Dr. Claiborne Billings PV: Dr. Gwenlyn Found  Discharge Diagnoses    Active Problems:   Claudication [facility])   Allergies Allergies  Allergen Reactions  . Metoprolol Other (See Comments)    Fatigue and malaise  . Fish Oil   . Levofloxacin     Unknown reaction  . Meloxicam     Unknown reaction  . Metaxalone     Unknown reaction  . Niaspan [Niacin]   . Sulfonamide Derivatives     unknown reaction - bad rash  . Tricor [Fenofibrate]     Diagnostic Studies/Procedures    Angiographic Data:   1: Abdominal aortogram-he renal arteries are widely patent. The infrarenal abdominal aorta was free of significant atherosclerotic changes. 2: Left lower extremity-there was a 60% proximal left external iliac artery stenosis that was fluoroscopically calcified. There was a 35 mmHg gradient on pullback after administration of 200 g of intra-arterial nitroglycerin via the SideArm sheath. There was atrophy is 50% calcified mid left SFA stenosis with three-vessel runoff. 3: Right lower extremity-there was a 80-90% calcified eccentric distal right common femoral artery stenosis. There was a 90% "in-stent restenosis" within the mid right SFA stent. There was a 95% calcified eccentric plaque just distal to the mid right SFA stent with three-vessel runoff.    History of Present Illness     70 year old gentleman with a history of CAD status post bypass grafting in the past and subsequent LAD intervention by Dr. Claiborne Billings. He has had bilateral SFA stents placed as well.[name] He's had right greater than left leg; occasional with recent Doppler studies suggest a high-frequency signal in his mid right SFA. He presents now for angiography and potential intervention.   Hospital Course     Patient [facility]  [facility] to undergo the planned procedure, performed by Dr. Gwenlyn Found. Angiographic details are outlined above. He was found to have high-grade distal calcified right common femoral artery stenosis as well as in-stent restenosis within his right SFA stent and a high grade calcified eccentric lesion just beyond this. There was also a physiologically significant lesion in the left external iliac artery.  He underwent successful left external iliac artery PTA and stenting using a nitinol self-expanding stent with residual disease in the right common femoral and SFA. The right common femoral artery will need to be surgically revascularized with endarterectomy and patch angioplasty after which the SFA can be fixed in a staged fashion.  He left the cath lab in stable condition and was monitored overnight. He had no post cath complications. His femoral access site and renal function remained stable. He ambulated w/o difficulty. He was continued on ASA and Plavix. He was last seen and examined by Dr. Acie Fredrickson who determined he was stable for discharge home. Per Dr. Gwenlyn Found, He will need f/u dopplers in 1 week and f/u with Dr. Gwenlyn Found in 2-3 weeks.   Consultants: none    Discharge Vitals Blood pressure 133/56, pulse 50, temperature 97.4 F (36.3 C), temperature source Oral, resp. rate 25, height 5\' 9"  (1.753 m), weight 193 lb 2 oz (87.6 kg), SpO2 98 %.  Filed Weights   09/26/15 0625 09/27/15 0425  Weight: 191 lb (86.637 kg) 193 lb 2 oz (87.6 kg)    Labs & Radiologic Studies    CBC  Recent Labs  09/25/15 1055 09/27/15 0442  WBC 8.8 7.8  HGB 13.7 12.5*  HCT 41.4 37.9*  MCV 86.4 83.8  PLT 274 0000000   Basic Metabolic Panel  Recent Labs  09/25/15 1055 09/27/15 0442  NA 142 142  K 4.2 3.9  CL 109 105  CO2 23 24  GLUCOSE 107* 85  BUN 13 15  CREATININE 1.16 1.12  CALCIUM 9.3 8.7*   Liver Function Tests No results for input(s): AST, ALT, ALKPHOS, BILITOT, PROT, ALBUMIN in the last 72 hours. No results  for input(s): LIPASE, AMYLASE in the last 72 hours. Cardiac Enzymes No results for input(s): CKTOTAL, CKMB, CKMBINDEX, TROPONINI in the last 72 hours. BNP Invalid input(s): POCBNP D-Dimer No results for input(s): DDIMER in the last 72 hours. Hemoglobin A1C No results for input(s): HGBA1C in the last 72 hours. Fasting Lipid Panel No results for input(s): CHOL, HDL, LDLCALC, TRIG, CHOLHDL, LDLDIRECT in the last 72 hours. Thyroid Function Tests  Recent Labs  09/26/15 0744  TSH 4.375   _____________  Dg Chest 2 View  09/25/2015  CLINICAL DATA:  Shortness of breath, cough, congestion x3 days EXAM: CHEST  2 VIEW COMPARISON:  07/18/2014 FINDINGS: Lungs are clear.  No pleural effusion or pneumothorax. The heart is normal in size. Postsurgical changes related to prior CABG. Median sternotomy. Cholecystectomy clips. IMPRESSION: No evidence of acute cardiopulmonary disease. Electronically Signed   By: Julian Hy M.D.   On: 09/25/2015 11:43   Disposition   Pt is being discharged home today in good condition.  Follow-up Plans & Appointments    Follow-up Information    Follow up with [name], MD.   Specialties:  Cardiology, Radiology   Why:  our office will call you with an appointment for ultrasound of your legs and follow-up with Dr. Pearla Dubonnet information:   [address] [place] [address] [place] [address] [phone]      Discharge Instructions    Diet - low sodium heart healthy    Complete by:  As directed      Increase activity slowly    Complete by:  As directed            Discharge Medications   Current Discharge Medication List    CONTINUE these medications which have NOT CHANGED   Details  Albuterol Sulfate (PROAIR RESPICLICK) 123XX123 (90 BASE) MCG/ACT AEPB Inhale 2 puffs into the lungs 4 (four) times daily as needed. Qty: 1 each, Refills: prn    aspirin EC 81 MG tablet Take 81 mg by mouth daily.    cetirizine (ZYRTEC) 10 MG tablet Take 10 mg  by mouth daily.    cholestyramine (QUESTRAN) 4 G packet Take 1 packet by mouth 2 (two) times daily with a meal. Qty: 60 each, Refills: 11    Cyanocobalamin (VITAMIN B-12 PO) Take 1 tablet by mouth daily.    cyclobenzaprine (FLEXERIL) 10 MG tablet Take 10 mg by mouth 3 (three) times daily as needed for muscle spasms.     isosorbide mononitrate (IMDUR) 30 MG 24 hr tablet Take 0.5 tablets (15 mg total) by mouth daily. Qty: 15 tablet, Refills: 10    Lactobacillus (FLORAJEN ACIDOPHILUS PO) Take by mouth daily.    levothyroxine (SYNTHROID, LEVOTHROID) 25 MCG tablet Take 25 mcg by mouth daily before breakfast.     losartan (COZAAR) 100 MG tablet TAKE ONE TABLET BY MOUTH EVERY DAY. Qty: 30 tablet, Refills: 5    nebivolol (BYSTOLIC) 5 MG tablet Take 1 tablet (5  mg total) by mouth daily. Qty: 30 tablet, Refills: 6    NITROSTAT 0.4 MG SL tablet Place 1 tablet (0.4 mg total) under the tongue every 5 (five) minutes as needed for chest pain. Qty: 25 tablet, Refills: 3    pantoprazole (PROTONIX) 40 MG tablet Take 1 tablet by mouth 2 (two) times daily.     rosuvastatin (CRESTOR) 20 MG tablet Take 1 tablet (20 mg total) by mouth daily. Qty: 90 tablet, Refills: 0    clopidogrel (PLAVIX) 75 MG tablet Take 1 tablet (75 mg total) by mouth daily. Qty: 90 tablet, Refills: 3   Associated Diagnoses: Coronary artery disease involving coronary bypass graft of native heart with unstable angina pectoris (HCC)    nabumetone (RELAFEN) 500 MG tablet Take 1 tablet by mouth 2 (two) times daily. Take 1 tab twice daily           Outstanding Labs/Studies   none  Duration of Discharge Encounter   Greater than 30 minutes including physician time.  Signed, Lyda Jester PA-C 09/27/2015, 9:17 AM  Attending Note:   The patient was seen and examined.  Agree with assessment and plan as noted above.  Changes made to the above note as needed.  Pt is stable after PV stenting . Will follow up with Dr.  Gwenlyn Found  Distal pulses are great Will need to see [facility] for further surgery on right leg.   Thayer Headings, Brooke Bonito., MD, [facility] 09/27/2015, 9:47 AM [address],  [place] Pager [phone]

## 2015-09-30 ENCOUNTER — Other Ambulatory Visit: Payer: Self-pay | Admitting: Cardiovascular Disease

## 2015-09-30 ENCOUNTER — Other Ambulatory Visit: Payer: Self-pay | Admitting: Cardiology

## 2015-09-30 DIAGNOSIS — I739 Peripheral vascular disease, unspecified: Secondary | ICD-10-CM

## 2015-09-30 DIAGNOSIS — Z95828 Presence of other vascular implants and grafts: Secondary | ICD-10-CM

## 2015-10-03 ENCOUNTER — Ambulatory Visit (HOSPITAL_COMMUNITY)
Admission: RE | Admit: 2015-10-03 | Discharge: 2015-10-03 | Disposition: A | Discharge status: Home / Self Care | Payer: Medicare Other | Source: Ambulatory Visit | Attending: Cardiovascular Disease | Admitting: Cardiovascular Disease

## 2015-10-03 DIAGNOSIS — Z95828 Presence of other vascular implants and grafts: Secondary | ICD-10-CM

## 2015-10-03 DIAGNOSIS — Z87891 Personal history of nicotine dependence: Secondary | ICD-10-CM | POA: Diagnosis not present

## 2015-10-03 DIAGNOSIS — Z95820 Peripheral vascular angioplasty status with implants and grafts: Secondary | ICD-10-CM | POA: Diagnosis not present

## 2015-10-03 DIAGNOSIS — I2510 Atherosclerotic heart disease of native coronary artery without angina pectoris: Secondary | ICD-10-CM | POA: Insufficient documentation

## 2015-10-03 DIAGNOSIS — I739 Peripheral vascular disease, unspecified: Secondary | ICD-10-CM | POA: Diagnosis not present

## 2015-10-03 DIAGNOSIS — I10 Essential (primary) hypertension: Secondary | ICD-10-CM | POA: Insufficient documentation

## 2015-10-03 DIAGNOSIS — E785 Hyperlipidemia, unspecified: Secondary | ICD-10-CM | POA: Insufficient documentation

## 2015-10-08 ENCOUNTER — Other Ambulatory Visit: Payer: Self-pay | Admitting: *Deleted

## 2015-10-08 DIAGNOSIS — I739 Peripheral vascular disease, unspecified: Secondary | ICD-10-CM

## 2015-10-22 ENCOUNTER — Ambulatory Visit (INDEPENDENT_AMBULATORY_CARE_PROVIDER_SITE_OTHER): Payer: Medicare Other | Admitting: Cardiovascular Disease

## 2015-10-22 ENCOUNTER — Encounter: Payer: Self-pay | Admitting: Cardiovascular Disease

## 2015-10-22 VITALS — BP 115/63 | HR 57 | Ht 69.0 in | Wt 196.8 lb

## 2015-10-22 DIAGNOSIS — I2510 Atherosclerotic heart disease of native coronary artery without angina pectoris: Secondary | ICD-10-CM

## 2015-10-22 DIAGNOSIS — I739 Peripheral vascular disease, unspecified: Secondary | ICD-10-CM

## 2015-10-22 NOTE — Progress Notes (Signed)
10/22/2015 [name]   [birth year]  [id number]  Primary Physician [name], MD Primary Cardiologist: Lorretta Harp MD Brian Brock   HPI:  Brian Brock is a 70 year old gentleman with a history of CAD status post bypass grafting in the past and subsequent LAD intervention by Dr. Claiborne Billings. He has had bilateral SFA stents placed as well.[name] He's had right greater than left leg laudication with recent Doppler studies suggest a high-frequency signal in his mid right SFA. He underwent peripheral angiography by myself 09/26/15 revealing a hemodynamically significant left external iliac artery stenosis which I stented as well as a 90% calcified, eccentric and exophytic plaque in the right common femoral artery. In addition, he had 90% "in-stent restenosis within the previously placed right SFA stent and a 95% calcified lesion just beyond this. His right lower extremity claudication has improved and his ABI has as well up to normal. I'm going to refer him to Dr. Trula Slade for right common femoral endarterectomy and patch angioplasty with intent to perform staged right SFA intervention by myself after that.   Current Outpatient Prescriptions  Medication Sig Dispense Refill  . Albuterol Sulfate (PROAIR RESPICLICK) 123XX123 (90 BASE) MCG/ACT AEPB Inhale 2 puffs into the lungs 4 (four) times daily as needed. (Patient taking differently: Inhale 2 puffs into the lungs 4 (four) times daily as needed. For wheezing) 1 each prn  . aspirin EC 81 MG tablet Take 81 mg by mouth daily.    . cetirizine (ZYRTEC) 10 MG tablet Take 10 mg by mouth daily.    . cholestyramine (QUESTRAN) 4 G packet Take 1 packet by mouth 2 (two) times daily with a meal. 60 each 11  . clopidogrel (PLAVIX) 75 MG tablet Take 1 tablet (75 mg total) by mouth daily. 90 tablet 3  . Cyanocobalamin (VITAMIN B-12 PO) Take 1 tablet by mouth daily.    . cyclobenzaprine (FLEXERIL) 10 MG tablet Take 10 mg by mouth 3 (three) times daily as  needed for muscle spasms.     . isosorbide mononitrate (IMDUR) 30 MG 24 hr tablet Take 0.5 tablets (15 mg total) by mouth daily. 15 tablet 10  . Lactobacillus (FLORAJEN ACIDOPHILUS PO) Take by mouth daily.    [name] levothyroxine (SYNTHROID, LEVOTHROID) 25 MCG tablet Take 25 mcg by mouth daily before breakfast.     . losartan (COZAAR) 100 MG tablet TAKE ONE TABLET BY MOUTH EVERY DAY. 30 tablet 5  . nabumetone (RELAFEN) 500 MG tablet Take 1 tablet by mouth 2 (two) times daily. Take 1 tab twice daily    . nebivolol (BYSTOLIC) 5 MG tablet Take 0.5 tablets (2.5 mg total) by mouth daily. 30 tablet 6  . NITROSTAT 0.4 MG SL tablet Place 1 tablet (0.4 mg total) under the tongue every 5 (five) minutes as needed for chest pain. 25 tablet 3  . pantoprazole (PROTONIX) 40 MG tablet Take 1 tablet by mouth 2 (two) times daily.     . rosuvastatin (CRESTOR) 20 MG tablet Take 1 tablet (20 mg total) by mouth daily. 90 tablet 0   No current facility-administered medications for this visit.    Allergies  Allergen Reactions  . Metoprolol Other (See Comments)    Fatigue and malaise  . Fish Oil   . Levofloxacin     Unknown reaction  . Meloxicam     Unknown reaction  . Metaxalone     Unknown reaction  . Niaspan [Niacin]   . Sulfonamide Derivatives  unknown reaction - bad rash  . Tricor [Fenofibrate]     Social History   Social History  . Marital Status: Married    Spouse Name: N/A  . Number of Children: N/A  . Years of Education: N/A   Occupational History  . Not on file.   Social History Main Topics  . Smoking status: Current Some Day Smoker -- 0.25 packs/day for 56 years    Types: Cigarettes    Last Attempt to Quit: 07/24/2014  . Smokeless tobacco: Current User    Types: Chew     Comment: 09/26/2015 "I chew once in awhile"  . Alcohol Use: Yes     Comment: 09/26/2015 "varies; might have a few drinks q 2 months"  . Drug Use: No  . Sexual Activity: Not Currently   Other Topics Concern  . Not  on file   Social History Narrative     Review of Systems: General: negative for chills, fever, night sweats or weight changes.  Cardiovascular: negative for chest pain, dyspnea on exertion, edema, orthopnea, palpitations, paroxysmal nocturnal dyspnea or shortness of breath Dermatological: negative for rash Respiratory: negative for cough or wheezing Urologic: negative for hematuria Abdominal: negative for nausea, vomiting, diarrhea, bright red blood per rectum, melena, or hematemesis Neurologic: negative for visual changes, syncope, or dizziness All other systems reviewed and are otherwise negative except as noted above.    Blood pressure 115/63, pulse 57, height 5\' 9"  (1.753 m), weight 196 lb 12.8 oz (89.268 kg).  General appearance: alert and no distress Neck: no adenopathy, no carotid bruit, no JVD, supple, symmetrical, trachea midline and thyroid not enlarged, symmetric, no tenderness/mass/nodules Lungs: clear to auscultation bilaterally Heart: regular rate and rhythm, S1, S2 normal, no murmur, click, rub or gallop Extremities: extremities normal, atraumatic, no cyanosis or edema  EKG not performed today  ASSESSMENT AND PLAN:   PVD- Lt SFA stent '04, Rt SFA stent '08 History of peripheral arterial disease status post left SFA intervention and L4 and right SFA stent in 08 Because of claudication angiogram to him on 09/26/15 revealing a hemodynamically significant left external iliac artery stenosis which I stented using a 10 mm x 4 cm Nitinol self-expanding stent He did have 50-60% segmental mid left SFA stenosis with three-vessel runoff. In addition, he had an 80-90% eccentric, calcified and exophytic right common femoral artery plaque. He had 90% "in-stent restenosis within his mid right SFA stent as well as 80% stenosis proximal to this and 95% calcified stenosis distal to this.his left lower extremity is clinically improved and his ABI has improved from 0.87 up to 1.02 I believe  he'll need right common femoral endarterectomy and patch angioplasty with staged right SFA intervention after that by myself.      Lorretta Harp MD FACP,FACC,FAHA, [facility] 10/22/2015 2:48 PM

## 2015-10-22 NOTE — Patient Instructions (Signed)
Medication Instructions:  Your physician recommends that you continue on your current medications as directed. Please refer to the Current Medication list given to you today.   Labwork: none  Testing/Procedures: none  Follow-Up: Your physician recommends that you schedule a follow-up appointment in: 3 MONTHS WITH DR Gwenlyn Found   You have been referred to DR [place].    Any Other Special Instructions Will Be Listed Below (If Applicable).     If you need a refill on your cardiac medications before your next appointment, please call your pharmacy.

## 2015-10-22 NOTE — Assessment & Plan Note (Signed)
History of peripheral arterial disease status post left SFA intervention and L4 and right SFA stent in 08 Because of claudication angiogram to him on 09/26/15 revealing a hemodynamically significant left external iliac artery stenosis which I stented using a 10 mm x 4 cm Nitinol self-expanding stent He did have 50-60% segmental mid left SFA stenosis with three-vessel runoff. In addition, he had an 80-90% eccentric, calcified and exophytic right common femoral artery plaque. He had 90% "in-stent restenosis within his mid right SFA stent as well as 80% stenosis proximal to this and 95% calcified stenosis distal to this.his left lower extremity is clinically improved and his ABI has improved from 0.87 up to 1.02 I believe he'll need right common femoral endarterectomy and patch angioplasty with staged right SFA intervention after that by myself.

## 2015-10-23 NOTE — Progress Notes (Signed)
Results discussed w patient, he is aware of recommendations and anticipating call from Dr. Stephens Shire office this week.

## 2015-10-25 ENCOUNTER — Encounter: Payer: Self-pay | Admitting: Surgery

## 2015-10-28 ENCOUNTER — Ambulatory Visit (INDEPENDENT_AMBULATORY_CARE_PROVIDER_SITE_OTHER): Payer: Medicare Other | Admitting: Surgery

## 2015-10-28 VITALS — BP 102/62 | HR 52 | Temp 97.8°F | Resp 16 | Ht 69.0 in | Wt 195.0 lb

## 2015-10-28 DIAGNOSIS — I70211 Atherosclerosis of native arteries of extremities with intermittent claudication, right leg: Secondary | ICD-10-CM

## 2015-10-28 NOTE — Progress Notes (Signed)
Vascular and Vein Specialist of [facility]  Patient name: Brian Brock MRN: [id number] DOB: 03-10-1946 Sex: male  REFERRING PHYSICIAN: Dr. Karlyne Greenspan  REASON FOR CONSULT: Claudication  HPI: Brian Brock is a 70 y.o. male, who is referred today for evaluation of right leg claudication.  The patient is a patient of Dr. Gwenlyn Found.  He is previously undergone bilateral superficial femoral artery intervention.  He also had a left external iliac stenosis that has been treated with stenting.  He recently underwent angiography which revealed a high-grade right common femoral artery stenosis.  He also had tandem lesions in the right superficial femoral artery.  Have been asked to help with femoral artery reconstruction.  The patient suffers from claudication at approximately 1 block.  It is exacerbated by walking up hill and walking on the beach.  He denies having any ulcers.  He does not have rest pain.  Patient has a history of coronary artery disease, status post CABG as well as percutaneous intervention.  He is medically managed for hyper cholesterolemia with a statin.  He is on dual antiplatelet therapy with aspirin and Plavix.  Past Medical History  Diagnosis Date  . Coronary artery disease     Multiple stents  . COPD (chronic obstructive pulmonary disease) ([place])   . Peripheral vascular disease ([place])     prior stenting  . Dyslipidemia   . Hyperlipemia   . Allergic rhinitis   . Hypertension   . Anginal pain ([place]) 1997  . Childhood asthma   . Chronic bronchitis ([place])     Yearly  . Hypothyroidism   . GERD (gastroesophageal reflux disease)   . Myocardial infarct ([place]) 1997    "mild"  . Arthritis     "lower back" (09/26/2015)    Family History  Problem Relation Age of Onset  . Coronary artery disease Father 29  . Heart attack Father   . Coronary artery disease Brother 39    CABG in 2000    SOCIAL HISTORY: Social History   Social History  .  Marital Status: Married    Spouse Name: N/A  . Number of Children: N/A  . Years of Education: N/A   Occupational History  . Not on file.   Social History Main Topics  . Smoking status: Current Some Day Smoker -- 0.25 packs/day for 56 years    Types: Cigarettes    Last Attempt to Quit: 07/24/2014  . Smokeless tobacco: Current User    Types: Chew     Comment: 09/26/2015 "I chew once in awhile"  . Alcohol Use: Yes     Comment: 09/26/2015 "varies; might have a few drinks q 2 months"  . Drug Use: No  . Sexual Activity: Not Currently   Other Topics Concern  . Not on file   Social History Narrative    Allergies  Allergen Reactions  . Metoprolol Other (See Comments)    Fatigue and malaise  . Fish Oil   . Levofloxacin     Unknown reaction  . Meloxicam     Unknown reaction  . Metaxalone     Unknown reaction  . Niaspan [Niacin]   . Sulfonamide Derivatives     unknown reaction - bad rash  . Tricor [Fenofibrate]     Current Outpatient Prescriptions  Medication Sig Dispense Refill  . aspirin EC 81 MG tablet Take 81 mg by mouth daily.    . cetirizine (ZYRTEC) 10 MG tablet Take 10 mg by mouth daily.    [name]  cholestyramine (QUESTRAN) 4 G packet Take 1 packet by mouth 2 (two) times daily with a meal. 60 each 11  . clopidogrel (PLAVIX) 75 MG tablet Take 1 tablet (75 mg total) by mouth daily. 90 tablet 3  . isosorbide mononitrate (IMDUR) 30 MG 24 hr tablet Take 0.5 tablets (15 mg total) by mouth daily. 15 tablet 10  . Lactobacillus (FLORAJEN ACIDOPHILUS PO) Take by mouth daily.    [name] levothyroxine (SYNTHROID, LEVOTHROID) 25 MCG tablet Take 25 mcg by mouth daily before breakfast.     . losartan (COZAAR) 100 MG tablet TAKE ONE TABLET BY MOUTH EVERY DAY. 30 tablet 5  . nebivolol (BYSTOLIC) 5 MG tablet Take 0.5 tablets (2.5 mg total) by mouth daily. 30 tablet 6  . NITROSTAT 0.4 MG SL tablet Place 1 tablet (0.4 mg total) under the tongue every 5 (five) minutes as needed for chest pain. 25  tablet 3  . pantoprazole (PROTONIX) 40 MG tablet Take 1 tablet by mouth 2 (two) times daily.     . rosuvastatin (CRESTOR) 20 MG tablet Take 1 tablet (20 mg total) by mouth daily. 90 tablet 0  . Albuterol Sulfate (PROAIR RESPICLICK) 123XX123 (90 BASE) MCG/ACT AEPB Inhale 2 puffs into the lungs 4 (four) times daily as needed. (Patient not taking: Reported on 10/28/2015) 1 each prn  . Cyanocobalamin (VITAMIN B-12 PO) Take 1 tablet by mouth daily. Reported on 10/28/2015    . cyclobenzaprine (FLEXERIL) 10 MG tablet Take 10 mg by mouth 3 (three) times daily as needed for muscle spasms. Reported on 10/28/2015    . nabumetone (RELAFEN) 500 MG tablet Take 1 tablet by mouth 2 (two) times daily. Reported on 10/28/2015     No current facility-administered medications for this visit.    REVIEW OF SYSTEMS:  [X]  denotes positive finding, [ ]  denotes negative finding Cardiac  Comments:  Chest pain or chest pressure:    Shortness of breath upon exertion: x   Short of breath when lying flat:    Irregular heart rhythm:        Vascular    Pain in calf, thigh, or hip brought on by ambulation:    Pain in feet at night that wakes you up from your sleep:     Blood clot in your veins:    Leg swelling:         Pulmonary    Oxygen at home:    Productive cough:     Wheezing:         Neurologic    Sudden weakness in arms or legs:     Sudden numbness in arms or legs:     Sudden onset of difficulty speaking or slurred speech:    Temporary loss of vision in one eye:     Problems with dizziness:         Gastrointestinal    Blood in stool:     Vomited blood:         Genitourinary    Burning when urinating:     Blood in urine:        Psychiatric    Major depression:         Hematologic    Bleeding problems:    Problems with blood clotting too easily:        Skin    Rashes or ulcers:        Constitutional    Fever or chills:      PHYSICAL EXAM: Filed Vitals:   10/28/15 1358  BP: 102/62  Pulse: 52    Temp: 97.8 F (36.6 C)  TempSrc: Oral  Resp: 16  Height: 5\' 9"  (1.753 m)  Weight: 195 lb (88.451 kg)  SpO2: 95%    GENERAL: The patient is a well-nourished male, in no acute distress. The vital signs are documented above. CARDIAC: There is a regular rate and rhythm.  VASCULAR: Faintly palpable right femoral pulse PULMONARY: There is good air exchange bilaterally without wheezing or rales. ABDOMEN: Soft and non-tender with normal pitched bowel sounds.  MUSCULOSKELETAL: There are no major deformities or cyanosis. NEUROLOGIC: No focal weakness or paresthesias are detected. SKIN: There are no ulcers or rashes noted. PSYCHIATRIC: The patient has a normal affect.  DATA:  I have reviewed his arteriogram which shows high-grade calcification within the right common femoral artery in addition to tandem lesions within the right superficial femoral artery  MEDICAL ISSUES: Right leg claudication.  We discussed proceeding with right common femoral endarterectomy.  He will then later undergo percutaneous intervention by Dr. Gwenlyn Found to address to tandem lesions in the right superficial femoral artery.  He'll need to be off his Plavix for 1 week prior to his operation which she would like scheduled at the end of July or early August.  We discussed the risks of wound complications as well as need for recurrent interventions.  All his questions were answered today.   Annamarie Major, MD Vascular and Vein Specialists of [facility] [phone] Pager [phone]

## 2015-11-06 ENCOUNTER — Other Ambulatory Visit: Payer: Self-pay

## 2015-11-06 ENCOUNTER — Other Ambulatory Visit: Payer: Self-pay | Admitting: *Deleted

## 2015-11-06 ENCOUNTER — Other Ambulatory Visit: Payer: Self-pay | Admitting: Cardiovascular Disease

## 2015-11-06 DIAGNOSIS — I25700 Atherosclerosis of coronary artery bypass graft(s), unspecified, with unstable angina pectoris: Secondary | ICD-10-CM

## 2015-11-06 MED ORDER — ROSUVASTATIN CALCIUM 20 MG PO TABS: 20.0000 mg | ORAL_TABLET | Freq: Every day | ORAL | Status: DC

## 2015-11-06 MED ORDER — CLOPIDOGREL BISULFATE 75 MG PO TABS: 75.0000 mg | ORAL_TABLET | Freq: Every day | ORAL | Status: DC

## 2015-12-10 NOTE — Pre-Procedure Instructions (Signed)
    POSIE JEFCOAT  12/10/2015      [facility] - [place]  [place] [address] Phone: [phone] Fax: [phone]    Your procedure is scheduled on Thurs. Aug 3  Report to [facility] Admitting at 5:30A.M.  Call this number if you have problems the morning of surgery:  [phone]   Remember:  Do not eat food or drink liquids after midnight.  Take these medicines the morning of surgery with A SIP OF WATER: zyrtec, isosorbide (imdur), levothyroxine(synthroid), nebivolol(bystolic), nitrostat if needed, pantoprazole(protonix)             Continue aspirin and plavix per Dr. Trula Slade.- stop Plavix as directed by your Dr.              Stop NSAIDS: advil, motrin, ibuprofen, goody's,and herbal medicines 1 week prior to surgery.   Do not wear jewelry.  Do not wear lotions, powders, or perfumes.  You may not wear deoderant.  Do not shave 48 hours prior to surgery.  Men may shave face and neck.  Do not bring valuables to the hospital.  [facility] is not responsible for any belongings or valuables.  Contacts, dentures or bridgework may not be worn into surgery.  Leave your suitcase in the car.  After surgery it may be brought to your room.  For patients admitted to the hospital, discharge time will be determined by your treatment team.  Patients discharged the day of surgery will not be allowed to drive home.    Special instructions:  Review preparing for surgery handout  Please read over the following fact sheets that you were given. MRSA Information

## 2015-12-11 ENCOUNTER — Encounter (HOSPITAL_COMMUNITY)
Admission: RE | Admit: 2015-12-11 | Discharge: 2015-12-11 | Disposition: A | Discharge status: Home / Self Care | Payer: Medicare Other | Source: Ambulatory Visit | Attending: Surgery | Admitting: Surgery

## 2015-12-11 ENCOUNTER — Encounter (HOSPITAL_COMMUNITY): Payer: Self-pay

## 2015-12-11 DIAGNOSIS — I739 Peripheral vascular disease, unspecified: Secondary | ICD-10-CM | POA: Insufficient documentation

## 2015-12-11 DIAGNOSIS — Z951 Presence of aortocoronary bypass graft: Secondary | ICD-10-CM | POA: Insufficient documentation

## 2015-12-11 DIAGNOSIS — I2510 Atherosclerotic heart disease of native coronary artery without angina pectoris: Secondary | ICD-10-CM | POA: Diagnosis not present

## 2015-12-11 DIAGNOSIS — Z01818 Encounter for other preprocedural examination: Secondary | ICD-10-CM | POA: Insufficient documentation

## 2015-12-11 DIAGNOSIS — E039 Hypothyroidism, unspecified: Secondary | ICD-10-CM | POA: Diagnosis not present

## 2015-12-11 DIAGNOSIS — Z7902 Long term (current) use of antithrombotics/antiplatelets: Secondary | ICD-10-CM | POA: Diagnosis not present

## 2015-12-11 DIAGNOSIS — Z79899 Other long term (current) drug therapy: Secondary | ICD-10-CM | POA: Diagnosis not present

## 2015-12-11 DIAGNOSIS — Z955 Presence of coronary angioplasty implant and graft: Secondary | ICD-10-CM | POA: Diagnosis not present

## 2015-12-11 DIAGNOSIS — J449 Chronic obstructive pulmonary disease, unspecified: Secondary | ICD-10-CM | POA: Diagnosis not present

## 2015-12-11 DIAGNOSIS — Z0183 Encounter for blood typing: Secondary | ICD-10-CM | POA: Diagnosis not present

## 2015-12-11 DIAGNOSIS — I10 Essential (primary) hypertension: Secondary | ICD-10-CM | POA: Insufficient documentation

## 2015-12-11 DIAGNOSIS — I252 Old myocardial infarction: Secondary | ICD-10-CM | POA: Diagnosis not present

## 2015-12-11 DIAGNOSIS — Z01812 Encounter for preprocedural laboratory examination: Secondary | ICD-10-CM | POA: Diagnosis not present

## 2015-12-11 DIAGNOSIS — K219 Gastro-esophageal reflux disease without esophagitis: Secondary | ICD-10-CM | POA: Insufficient documentation

## 2015-12-11 DIAGNOSIS — E785 Hyperlipidemia, unspecified: Secondary | ICD-10-CM | POA: Diagnosis not present

## 2015-12-11 HISTORY — DX: Cardiac arrhythmia, unspecified: I49.9

## 2015-12-11 HISTORY — DX: Personal history of other diseases of the digestive system: Z87.19

## 2015-12-11 LAB — COMPREHENSIVE METABOLIC PANEL
ALT: 19 U/L (ref 17–63)
AST: 24 U/L (ref 15–41)
Albumin: 3.4 g/dL — ABNORMAL LOW (ref 3.5–5.0)
Alkaline Phosphatase: 87 U/L (ref 38–126)
Anion gap: 4 — ABNORMAL LOW (ref 5–15)
BUN: 13 mg/dL (ref 6–20)
CO2: 22 mmol/L (ref 22–32)
Calcium: 8.6 mg/dL — ABNORMAL LOW (ref 8.9–10.3)
Chloride: 113 mmol/L — ABNORMAL HIGH (ref 101–111)
Creatinine, Ser: 1.02 mg/dL (ref 0.61–1.24)
GFR calc Af Amer: >60 mL/min (ref >60)
GFR calc non Af Amer: >60 mL/min (ref >60)
Glucose, Bld: 88 mg/dL (ref 65–99)
Potassium: 3.6 mmol/L (ref 3.5–5.1)
Sodium: 139 mmol/L (ref 135–145)
Total Bilirubin: 0.5 mg/dL (ref 0.3–1.2)
Total Protein: 6 g/dL — ABNORMAL LOW (ref 6.5–8.1)

## 2015-12-11 LAB — CBC
HCT: 39.2 % (ref 39.0–52.0)
Hemoglobin: 12.9 g/dL — ABNORMAL LOW (ref 13.0–17.0)
MCH: 27.9 pg (ref 26.0–34.0)
MCHC: 32.9 g/dL (ref 30.0–36.0)
MCV: 84.7 fL (ref 78.0–100.0)
Platelets: 228 10*3/uL (ref 150–400)
RBC: 4.63 MIL/uL (ref 4.22–5.81)
RDW: 13.8 % (ref 11.5–15.5)
WBC: 9.3 10*3/uL (ref 4.0–10.5)

## 2015-12-11 LAB — TYPE AND SCREEN
ABO/RH(D): B POS
Antibody Screen: NEGATIVE

## 2015-12-11 LAB — APTT: aPTT: 33 seconds (ref 24–36)

## 2015-12-11 LAB — SURGICAL PCR SCREEN
MRSA, PCR: NEGATIVE
Staphylococcus aureus: NEGATIVE

## 2015-12-11 LAB — URINALYSIS, ROUTINE W REFLEX MICROSCOPIC
Bilirubin Urine: NEGATIVE
Glucose, UA: NEGATIVE mg/dL
Hgb urine dipstick: NEGATIVE
Ketones, ur: NEGATIVE mg/dL
Leukocytes, UA: NEGATIVE
Nitrite: NEGATIVE
Protein, ur: NEGATIVE mg/dL
Specific Gravity, Urine: 1.024 (ref 1.005–1.030)
pH: 5.5 (ref 5.0–8.0)

## 2015-12-11 LAB — PROTIME-INR
INR: 1.03
Prothrombin Time: 13.6 seconds (ref 11.4–15.2)

## 2015-12-11 NOTE — Progress Notes (Signed)
PCP is Dr. Janace Litten  Cardiologist is Dr  Jolayne Panther noted form 11-2012 Stress test from 2016 Card cath from 2016 Encouraged pt not to smoke on the day of surgery. Pt. States he was told to stop taking Plavix 12-12-15

## 2015-12-13 NOTE — Progress Notes (Signed)
Anesthesia chart review: Patient is a 70 year old male scheduled for right femoral endarterectomy on 12/19/2015 by Dr. Trula Slade. Patient with known PAD. Following recent angiogram, cardiologist Dr. Gwenlyn Found felt patient would need a right common femoral endarterectomy and patch angioplasty (Dr. Trula Slade) followed by stage right SFA intervention (Dr. Gwenlyn Found).  History includes MI '97, CAD s/p LAD/RCA stents 1997 1998, LCX stent 2004, s/p CABG (LIMA-LAD, SVG-OM1) 09/12/12 s/p LAD stent (for occluded LIMA-LAD) 07/24/14, PAD s/p bilateral SFA stents (left '04, right '08) and left EIA stent 5/11/7, dysrhythmia, smoking, COPD, dyslipidemia, HTN, hypothyroidism, GERD, hiatal hernia, cholecystectomy, tonsillectomy.  PCP is Dr. Janace Litten. Primary cardiologist is Dr. Claiborne Billings. PV cardiologist is Dr. Gwenlyn Found. Both have seen him within about the past three months.   Meds include aspirin, Zyrtec, Questran, Plavix, Imdur, Synthroid, losartan, bysystolic, nitroglycerin, Protonix, Crestor. He is holding Plavix starting 12/12/15.  09/25/15 EKG: SB at 63 bpm, PACs, non-specific T wave abnormality.  07/24/14 Cardiac cath: IMPRESSION: - Multi vessel coronary artery disease with 70% ostial LAD stenosis, 40-50% narrowing in the proximal LAD stent and 95-99% stenosis after the stented segment in the native LAD with 60% ostial septal perforating artery stenosis; 20th percent ostial 30% proximal circumflex stenosis with widely patent circumflex stent; and widely patent proximal and mid RCA stents. - Occluded LIMA  graft in its midsegment prior to its anastomosis into the LAD. - Widely patent saphenous vein graft supplying the distal circumflex marginal vessel. - Successful percutaneous coronary intervention to the LAD at its ostium and in the midsegment treated with Angiosculpt scoring balloon, insertion of a 3.034 mm resolute stent extending from the ostium to the mid LAD, postdilated to 3.41 mm with the entire region been reduced to 0%  and evidence for brisk TIMI-3 flow. RECOMMENDATION: The patient should continue on DAPT indefinitely.  Smoking cessation is essential.  07/13/14 Nuclear stress test:  Overall Impression:  High risk stress nuclear study with a large, severe, partially reversible anterior and apical defect consistent with prior apical infarct and severe ischemia in the distal anterior wall and apex. LV Wall Motion:  Apical hypokinesis. (LHC recommended.)  11/23/12 Echo: Study Conclusions - Left ventricle: The cavity size was normal. Wall thickness was normal. Systolic function was normal. The estimated ejection fraction was in the range of 50% to 55%. Possible mild hypokinesis of the anteroseptal myocardium. Left ventricular diastolic function parameters were normal. - Mitral valve: Valve area by pressure half-time: 2cm^2. - Atrial septum: No defect or patent foramen ovale was identified.  09/08/12 Carotid U/S: Summary: - Right; minimal plaque noted. Left: mild mixed plaque origin ICA. Bilateral: no ICA stenosis. Vertebral artery flow is antegrade. ICA/CCA ratio: R-1.43 L-1.46  11/04/12-11/17/12 Event monitor: SR, SB, sinus arrhythmia, second atrial run.  09/25/15 CXR: IMPRESSION: No evidence of acute cardiopulmonary disease.  Preoperative labs noted.   He has had recent cardiology evaluations with referral for this procedure by Dr. Gwenlyn Found. If no acute changes then I anticipate that he can proceed as planned. Unfortunately, he continues to smoke.  George Hugh [facility] Stay [facility]/Anesthesiology Phone [phone] 12/13/2015 2:13 PM

## 2015-12-18 MED ORDER — DEXTROSE 5 % IV SOLN
1.5000 g | INTRAVENOUS | Status: AC
  Administered 2015-12-19: 1.5 g via INTRAVENOUS
  Filled 2015-12-18 (×2): qty 1.5

## 2015-12-18 MED ORDER — SODIUM CHLORIDE 0.9 % IV SOLN: INTRAVENOUS | Status: DC

## 2015-12-18 NOTE — Anesthesia Preprocedure Evaluation (Addendum)
Anesthesia Evaluation  Patient identified by MRN, date of birth, ID band Patient awake    Reviewed: Allergy & Precautions, NPO status , Patient's Chart, lab work & pertinent test results, reviewed documented beta blocker date and time   Airway Mallampati: II  TM Distance: >3 FB Neck ROM: Full    Dental  (+) Teeth Intact   Pulmonary neg pulmonary ROS, asthma , COPD, Current Smoker,    Pulmonary exam normal breath sounds clear to auscultation       Cardiovascular hypertension, Pt. on medications and Pt. on home beta blockers + angina + CAD, + Past MI, + Cardiac Stents, + CABG and + Peripheral Vascular Disease   Rhythm:Regular Rate:Normal  MI '97, CAD s/p LAD/RCA stents 1997 1998, LCX stent 2004, s/p CABG (LIMA-LAD, SVG-OM1) 09/12/12 s/p LAD stent (for occluded LIMA-LAD) 07/24/14, PAD s/p bilateral SFA stents (left '04, right '08) and left EIA stent 5/11/7  /10/17 EKG: SB at 63 bpm, PACs, non-specific T wave abnormality.  07/24/14 Cardiac cath: IMPRESSION: - Multi vessel coronary artery disease with 70% ostial LAD stenosis, 40-50% narrowing in the proximal LAD stent and 95-99% stenosis after the stented segment in the native LAD with 60% ostial septal perforating artery stenosis; 20th percent ostial 30% proximal circumflex stenosis with widely patent circumflex stent; and widely patent proximal and mid RCA stents. - Occluded LIMA graft in its midsegment prior to its anastomosis into the LAD. - Widely patent saphenous vein graft supplying the distal circumflex marginal vessel. - Successful percutaneous coronary intervention to the LAD at its ostium and in the midsegment treated with Angiosculpt scoring balloon, insertion of a 3.034 mm resolute stent extending from the ostium to the mid LAD, postdilated to 3.41 mm with the entire region been reduced to 0% and evidence for brisk TIMI-3 flow. RECOMMENDATION: The patient should continue on DAPT  indefinitely. Smoking cessation is essential.  07/13/14 Nuclear stress test:  Overall Impression: High risk stress nuclear study with a large, severe, partially reversible anterior and apical defect consistent with prior apical infarct and severe ischemia in the distal anterior wall and apex. LV Wall Motion: Apical hypokinesis. (LHC recommended.)  11/23/12 Echo: Study Conclusions - Left ventricle: The cavity size was normal. Wall thicknesswas normal. Systolic function was normal. The estimatedejection fraction was in the range of 50% to 55%. Possiblemild hypokinesis of the anteroseptal myocardium. Left ventricular diastolic function parameters were normal. - Mitral valve: Valve area by pressure half-time: 2cm^2. - Atrial septum: No defect or patent foramen ovale wasidentified.   Neuro/Psych negative neurological ROS     GI/Hepatic Neg liver ROS, hiatal hernia, GERD  Medicated,  Endo/Other  Hypothyroidism   Renal/GU negative Renal ROS     Musculoskeletal  (+) Arthritis , Osteoarthritis,    Abdominal   Peds  Hematology  (+) Blood dyscrasia (Plavix therapy), anemia ,   Anesthesia Other Findings Day of surgery medications reviewed with the patient.  Reproductive/Obstetrics                            Anesthesia Physical Anesthesia Plan  ASA: IV  Anesthesia Plan: General   Post-op Pain Management:    Induction: Intravenous  Airway Management Planned: Oral ETT  Additional Equipment: Arterial line  Intra-op Plan:   Post-operative Plan: Extubation in OR  Informed Consent: I have reviewed the patients History and Physical, chart, labs and discussed the procedure including the risks, benefits and alternatives for the proposed anesthesia with the  patient or authorized representative who has indicated his/her understanding and acceptance.   Dental advisory given  Plan Discussed with: CRNA  Anesthesia Plan Comments: (Risks/benefits of  general anesthesia discussed with patient including risk of damage to teeth, lips, gum, and tongue, nausea/vomiting, allergic reactions to medications, and the possibility of heart attack, stroke and death.  All patient questions answered.  Patient wishes to proceed.)        Anesthesia Quick Evaluation

## 2015-12-19 ENCOUNTER — Inpatient Hospital Stay (HOSPITAL_COMMUNITY): Payer: Medicare Other | Admitting: Vascular Surgery

## 2015-12-19 ENCOUNTER — Telehealth: Payer: Self-pay | Admitting: Surgery

## 2015-12-19 ENCOUNTER — Encounter (HOSPITAL_COMMUNITY): Payer: Self-pay | Admitting: *Deleted

## 2015-12-19 ENCOUNTER — Inpatient Hospital Stay (HOSPITAL_COMMUNITY): Payer: Medicare Other | Admitting: Certified Registered Nurse Anesthetist

## 2015-12-19 ENCOUNTER — Encounter (HOSPITAL_COMMUNITY)
Admission: RE | Disposition: A | Discharge status: Home / Self Care | Payer: Self-pay | Source: Ambulatory Visit | Attending: Surgery

## 2015-12-19 ENCOUNTER — Inpatient Hospital Stay (HOSPITAL_COMMUNITY)
Admission: RE | Admit: 2015-12-19 | Discharge: 2015-12-20 | DRG: 272 | Disposition: A | Discharge status: Home / Self Care | Payer: Medicare Other | Source: Ambulatory Visit | Attending: Surgery | Admitting: Surgery

## 2015-12-19 DIAGNOSIS — Z955 Presence of coronary angioplasty implant and graft: Secondary | ICD-10-CM

## 2015-12-19 DIAGNOSIS — I10 Essential (primary) hypertension: Secondary | ICD-10-CM | POA: Diagnosis not present

## 2015-12-19 DIAGNOSIS — J309 Allergic rhinitis, unspecified: Secondary | ICD-10-CM | POA: Diagnosis present

## 2015-12-19 DIAGNOSIS — E785 Hyperlipidemia, unspecified: Secondary | ICD-10-CM | POA: Diagnosis not present

## 2015-12-19 DIAGNOSIS — K219 Gastro-esophageal reflux disease without esophagitis: Secondary | ICD-10-CM | POA: Diagnosis not present

## 2015-12-19 DIAGNOSIS — Z7902 Long term (current) use of antithrombotics/antiplatelets: Secondary | ICD-10-CM

## 2015-12-19 DIAGNOSIS — Z881 Allergy status to other antibiotic agents status: Secondary | ICD-10-CM | POA: Diagnosis not present

## 2015-12-19 DIAGNOSIS — J449 Chronic obstructive pulmonary disease, unspecified: Secondary | ICD-10-CM | POA: Diagnosis present

## 2015-12-19 DIAGNOSIS — Z8249 Family history of ischemic heart disease and other diseases of the circulatory system: Secondary | ICD-10-CM

## 2015-12-19 DIAGNOSIS — I739 Peripheral vascular disease, unspecified: Secondary | ICD-10-CM | POA: Diagnosis not present

## 2015-12-19 DIAGNOSIS — E039 Hypothyroidism, unspecified: Secondary | ICD-10-CM | POA: Diagnosis present

## 2015-12-19 DIAGNOSIS — Z7982 Long term (current) use of aspirin: Secondary | ICD-10-CM | POA: Diagnosis not present

## 2015-12-19 DIAGNOSIS — Z888 Allergy status to other drugs, medicaments and biological substances status: Secondary | ICD-10-CM | POA: Diagnosis not present

## 2015-12-19 DIAGNOSIS — I2510 Atherosclerotic heart disease of native coronary artery without angina pectoris: Secondary | ICD-10-CM | POA: Diagnosis not present

## 2015-12-19 DIAGNOSIS — I70211 Atherosclerosis of native arteries of extremities with intermittent claudication, right leg: Secondary | ICD-10-CM | POA: Diagnosis not present

## 2015-12-19 DIAGNOSIS — Z882 Allergy status to sulfonamides status: Secondary | ICD-10-CM

## 2015-12-19 DIAGNOSIS — Z951 Presence of aortocoronary bypass graft: Secondary | ICD-10-CM | POA: Diagnosis not present

## 2015-12-19 DIAGNOSIS — I252 Old myocardial infarction: Secondary | ICD-10-CM | POA: Diagnosis not present

## 2015-12-19 DIAGNOSIS — F17210 Nicotine dependence, cigarettes, uncomplicated: Secondary | ICD-10-CM | POA: Diagnosis present

## 2015-12-19 DIAGNOSIS — Z79899 Other long term (current) drug therapy: Secondary | ICD-10-CM

## 2015-12-19 HISTORY — PX: ENDARTERECTOMY FEMORAL: SHX5804

## 2015-12-19 HISTORY — PX: PATCH ANGIOPLASTY: SHX6230

## 2015-12-19 LAB — CBC
HCT: 37.5 % — ABNORMAL LOW (ref 39.0–52.0)
Hemoglobin: 12.1 g/dL — ABNORMAL LOW (ref 13.0–17.0)
MCH: 27.5 pg (ref 26.0–34.0)
MCHC: 32.3 g/dL (ref 30.0–36.0)
MCV: 85.2 fL (ref 78.0–100.0)
Platelets: 210 K/uL (ref 150–400)
RBC: 4.4 MIL/uL (ref 4.22–5.81)
RDW: 14 % (ref 11.5–15.5)
WBC: 10.6 K/uL — ABNORMAL HIGH (ref 4.0–10.5)

## 2015-12-19 LAB — CREATININE, SERUM
Creatinine, Ser: 0.97 mg/dL (ref 0.61–1.24)
GFR calc Af Amer: >60 mL/min (ref >60)
GFR calc non Af Amer: >60 mL/min (ref >60)

## 2015-12-19 SURGERY — ENDARTERECTOMY, FEMORAL
Anesthesia: General | Site: Groin | Laterality: Right

## 2015-12-19 MED ORDER — DOCUSATE SODIUM 100 MG PO CAPS: 100.0000 mg | ORAL_CAPSULE | Freq: Every day | ORAL | Status: DC

## 2015-12-19 MED ORDER — MORPHINE SULFATE (PF) 2 MG/ML IV SOLN: 2.0000 mg | INTRAVENOUS | Status: DC | PRN

## 2015-12-19 MED ORDER — SUFENTANIL CITRATE 50 MCG/ML IV SOLN
INTRAVENOUS | Status: AC
  Filled 2015-12-19: qty 1

## 2015-12-19 MED ORDER — DEXTROSE 5 % IV SOLN
1.5000 g | Freq: Two times a day (BID) | INTRAVENOUS | Status: AC
  Administered 2015-12-19 – 2015-12-20 (×2): 1.5 g via INTRAVENOUS
  Filled 2015-12-19 (×2): qty 1.5

## 2015-12-19 MED ORDER — ENOXAPARIN SODIUM 30 MG/0.3ML ~~LOC~~ SOLN: 30.0000 mg | SUBCUTANEOUS | Status: DC

## 2015-12-19 MED ORDER — EPHEDRINE SULFATE 50 MG/ML IJ SOLN
INTRAMUSCULAR | Status: DC | PRN
  Administered 2015-12-19 (×2): 5 mg via INTRAVENOUS
  Administered 2015-12-19: 15 mg via INTRAVENOUS
  Administered 2015-12-19: 5 mg via INTRAVENOUS
  Administered 2015-12-19: 10 mg via INTRAVENOUS

## 2015-12-19 MED ORDER — LACTATED RINGERS IV SOLN
INTRAVENOUS | Status: DC | PRN
  Administered 2015-12-19: 07:00:00 via INTRAVENOUS

## 2015-12-19 MED ORDER — LEVOTHYROXINE SODIUM 25 MCG PO TABS
25.0000 ug | ORAL_TABLET | Freq: Every day | ORAL | Status: DC
  Administered 2015-12-20: 25 ug via ORAL
  Filled 2015-12-19: qty 1

## 2015-12-19 MED ORDER — PANTOPRAZOLE SODIUM 40 MG PO TBEC
40.0000 mg | DELAYED_RELEASE_TABLET | Freq: Two times a day (BID) | ORAL | Status: DC
  Administered 2015-12-19 (×2): 40 mg via ORAL
  Filled 2015-12-19 (×2): qty 1

## 2015-12-19 MED ORDER — SUFENTANIL CITRATE 50 MCG/ML IV SOLN
INTRAVENOUS | Status: DC | PRN
  Administered 2015-12-19: 10 ug via INTRAVENOUS
  Administered 2015-12-19: 5 ug via INTRAVENOUS
  Administered 2015-12-19: 10 ug via INTRAVENOUS

## 2015-12-19 MED ORDER — CLOPIDOGREL BISULFATE 75 MG PO TABS
75.0000 mg | ORAL_TABLET | Freq: Every day | ORAL | Status: DC
  Administered 2015-12-20: 75 mg via ORAL
  Filled 2015-12-19: qty 1

## 2015-12-19 MED ORDER — NEBIVOLOL HCL 2.5 MG PO TABS
2.5000 mg | ORAL_TABLET | Freq: Every day | ORAL | Status: DC
  Filled 2015-12-19 (×2): qty 1

## 2015-12-19 MED ORDER — CHLORHEXIDINE GLUCONATE CLOTH 2 % EX PADS: 6.0000 | MEDICATED_PAD | Freq: Once | CUTANEOUS | Status: DC

## 2015-12-19 MED ORDER — POTASSIUM CHLORIDE CRYS ER 20 MEQ PO TBCR: 20.0000 meq | EXTENDED_RELEASE_TABLET | Freq: Every day | ORAL | Status: DC | PRN

## 2015-12-19 MED ORDER — SODIUM CHLORIDE 0.9 % IV SOLN
INTRAVENOUS | Status: DC
  Administered 2015-12-19: 10:00:00 via INTRAVENOUS

## 2015-12-19 MED ORDER — HEMOSTATIC AGENTS (NO CHARGE) OPTIME
TOPICAL | Status: DC | PRN
  Administered 2015-12-19: 1 via TOPICAL

## 2015-12-19 MED ORDER — ONDANSETRON HCL 4 MG/2ML IJ SOLN: 4.0000 mg | Freq: Once | INTRAMUSCULAR | Status: DC | PRN

## 2015-12-19 MED ORDER — SODIUM CHLORIDE 0.9 % IV SOLN
INTRAVENOUS | Status: DC | PRN
  Administered 2015-12-19: 09:00:00

## 2015-12-19 MED ORDER — PROPOFOL 10 MG/ML IV BOLUS
INTRAVENOUS | Status: AC
  Filled 2015-12-19: qty 20

## 2015-12-19 MED ORDER — ASPIRIN EC 81 MG PO TBEC
81.0000 mg | DELAYED_RELEASE_TABLET | Freq: Every day | ORAL | Status: DC
Start: 2015-12-20 — End: 2015-12-20

## 2015-12-19 MED ORDER — EPHEDRINE 5 MG/ML INJ
INTRAVENOUS | Status: AC
  Filled 2015-12-19: qty 10

## 2015-12-19 MED ORDER — 0.9 % SODIUM CHLORIDE (POUR BTL) OPTIME
TOPICAL | Status: DC | PRN
  Administered 2015-12-19: 2000 mL

## 2015-12-19 MED ORDER — RISAQUAD PO CAPS
1.0000 | ORAL_CAPSULE | Freq: Every day | ORAL | Status: DC
  Filled 2015-12-19 (×2): qty 1

## 2015-12-19 MED ORDER — GUAIFENESIN-DM 100-10 MG/5ML PO SYRP: 15.0000 mL | ORAL_SOLUTION | ORAL | Status: DC | PRN

## 2015-12-19 MED ORDER — HYDROMORPHONE HCL 1 MG/ML IJ SOLN: 0.2500 mg | INTRAMUSCULAR | Status: DC | PRN

## 2015-12-19 MED ORDER — LOSARTAN POTASSIUM 50 MG PO TABS: 100.0000 mg | ORAL_TABLET | Freq: Every day | ORAL | Status: DC

## 2015-12-19 MED ORDER — GLYCOPYRROLATE 0.2 MG/ML IJ SOLN
INTRAMUSCULAR | Status: DC | PRN
  Administered 2015-12-19: 0.2 mg via INTRAVENOUS

## 2015-12-19 MED ORDER — MAGNESIUM HYDROXIDE 400 MG/5ML PO SUSP
30.0000 mL | Freq: Every day | ORAL | Status: DC | PRN
  Filled 2015-12-19: qty 30

## 2015-12-19 MED ORDER — GLYCOPYRROLATE 0.2 MG/ML IV SOSY
PREFILLED_SYRINGE | INTRAVENOUS | Status: AC
  Filled 2015-12-19: qty 3

## 2015-12-19 MED ORDER — ACETAMINOPHEN 325 MG PO TABS: 325.0000 mg | ORAL_TABLET | ORAL | Status: DC | PRN

## 2015-12-19 MED ORDER — SODIUM CHLORIDE 0.9 % IV SOLN
500.0000 mL | Freq: Once | INTRAVENOUS | Status: DC | PRN
Start: 2015-12-19 — End: 2015-12-20

## 2015-12-19 MED ORDER — SODIUM CHLORIDE 0.9 % IJ SOLN
INTRAMUSCULAR | Status: AC
  Filled 2015-12-19: qty 10

## 2015-12-19 MED ORDER — HYDRALAZINE HCL 20 MG/ML IJ SOLN: 5.0000 mg | INTRAMUSCULAR | Status: DC | PRN

## 2015-12-19 MED ORDER — PROPOFOL 10 MG/ML IV BOLUS
INTRAVENOUS | Status: DC | PRN
  Administered 2015-12-19: 100 mg via INTRAVENOUS

## 2015-12-19 MED ORDER — LIDOCAINE 2% (20 MG/ML) 5 ML SYRINGE
INTRAMUSCULAR | Status: AC
  Filled 2015-12-19: qty 5

## 2015-12-19 MED ORDER — LORATADINE 10 MG PO TABS
10.0000 mg | ORAL_TABLET | Freq: Every day | ORAL | Status: DC
  Filled 2015-12-19: qty 1

## 2015-12-19 MED ORDER — ONDANSETRON HCL 4 MG/2ML IJ SOLN
INTRAMUSCULAR | Status: AC
  Filled 2015-12-19: qty 2

## 2015-12-19 MED ORDER — ONDANSETRON HCL 4 MG/2ML IJ SOLN: 4.0000 mg | Freq: Four times a day (QID) | INTRAMUSCULAR | Status: DC | PRN

## 2015-12-19 MED ORDER — MIDAZOLAM HCL 5 MG/5ML IJ SOLN
INTRAMUSCULAR | Status: DC | PRN
  Administered 2015-12-19 (×2): 1 mg via INTRAVENOUS

## 2015-12-19 MED ORDER — NITROGLYCERIN 0.4 MG SL SUBL: 0.4000 mg | SUBLINGUAL_TABLET | SUBLINGUAL | Status: DC | PRN

## 2015-12-19 MED ORDER — PROTAMINE SULFATE 10 MG/ML IV SOLN
INTRAVENOUS | Status: DC | PRN
  Administered 2015-12-19: 50 mg via INTRAVENOUS
  Administered 2015-12-19: 25 mg via INTRAVENOUS

## 2015-12-19 MED ORDER — ACETAMINOPHEN 325 MG RE SUPP: 325.0000 mg | RECTAL | Status: DC | PRN

## 2015-12-19 MED ORDER — PHENYLEPHRINE HCL 10 MG/ML IJ SOLN
INTRAVENOUS | Status: DC | PRN
  Administered 2015-12-19: 40 ug/min via INTRAVENOUS

## 2015-12-19 MED ORDER — ISOSORBIDE MONONITRATE ER 30 MG PO TB24
15.0000 mg | ORAL_TABLET | Freq: Every day | ORAL | Status: DC
  Administered 2015-12-19: 15 mg via ORAL
  Filled 2015-12-19 (×2): qty 1

## 2015-12-19 MED ORDER — MIDAZOLAM HCL 2 MG/2ML IJ SOLN
INTRAMUSCULAR | Status: AC
  Filled 2015-12-19: qty 2

## 2015-12-19 MED ORDER — CHOLESTYRAMINE 4 G PO PACK
1.0000 | PACK | Freq: Two times a day (BID) | ORAL | Status: DC
  Administered 2015-12-20: 1 via ORAL
  Filled 2015-12-19 (×4): qty 1

## 2015-12-19 MED ORDER — PROTAMINE SULFATE 10 MG/ML IV SOLN
INTRAVENOUS | Status: AC
  Filled 2015-12-19: qty 5

## 2015-12-19 MED ORDER — HEPARIN SODIUM (PORCINE) 1000 UNIT/ML IJ SOLN
INTRAMUSCULAR | Status: DC | PRN
  Administered 2015-12-19: 8000 [IU] via INTRAVENOUS
  Administered 2015-12-19: 1000 [IU] via INTRAVENOUS

## 2015-12-19 MED ORDER — ONDANSETRON HCL 4 MG/2ML IJ SOLN
INTRAMUSCULAR | Status: DC | PRN
  Administered 2015-12-19: 4 mg via INTRAVENOUS

## 2015-12-19 MED ORDER — BISACODYL 10 MG RE SUPP: 10.0000 mg | Freq: Every day | RECTAL | Status: DC | PRN

## 2015-12-19 MED ORDER — ROCURONIUM BROMIDE 50 MG/5ML IV SOLN
INTRAVENOUS | Status: AC
  Filled 2015-12-19: qty 1

## 2015-12-19 MED ORDER — PHENOL 1.4 % MT LIQD: 1.0000 | OROMUCOSAL | Status: DC | PRN

## 2015-12-19 MED ORDER — HYDROCODONE-ACETAMINOPHEN 7.5-325 MG PO TABS: 1.0000 | ORAL_TABLET | Freq: Once | ORAL | Status: DC | PRN

## 2015-12-19 MED ORDER — ALUM & MAG HYDROXIDE-SIMETH 200-200-20 MG/5ML PO SUSP: 15.0000 mL | ORAL | Status: DC | PRN

## 2015-12-19 MED ORDER — OXYCODONE-ACETAMINOPHEN 5-325 MG PO TABS: 1.0000 | ORAL_TABLET | ORAL | Status: DC | PRN

## 2015-12-19 MED ORDER — FLORAJEN ACIDOPHILUS PO CAPS: ORAL_CAPSULE | Freq: Every day | ORAL | Status: DC

## 2015-12-19 MED ORDER — ROCURONIUM BROMIDE 100 MG/10ML IV SOLN
INTRAVENOUS | Status: DC | PRN
  Administered 2015-12-19: 50 mg via INTRAVENOUS

## 2015-12-19 MED ORDER — HEPARIN SODIUM (PORCINE) 1000 UNIT/ML IJ SOLN
INTRAMUSCULAR | Status: AC
  Filled 2015-12-19: qty 1

## 2015-12-19 MED ORDER — ROSUVASTATIN CALCIUM 20 MG PO TABS: 20.0000 mg | ORAL_TABLET | Freq: Every day | ORAL | Status: DC

## 2015-12-19 SURGICAL SUPPLY — 51 items
BANDAGE ELASTIC 4 VELCRO ST LF (GAUZE/BANDAGES/DRESSINGS) IMPLANT
CANISTER SUCTION 2500CC (MISCELLANEOUS) ×2 IMPLANT
CATH EMB 4FR 40CM (CATHETERS) ×2 IMPLANT
CLIP TI MEDIUM 24 (CLIP) ×2 IMPLANT
CLIP TI WIDE RED SMALL 24 (CLIP) ×2 IMPLANT
DRAIN CHANNEL 15F RND FF W/TCR (WOUND CARE) IMPLANT
DRAPE X-RAY CASS 24X20 (DRAPES) IMPLANT
DRSG COVADERM 4X10 (GAUZE/BANDAGES/DRESSINGS) IMPLANT
DRSG COVADERM 4X8 (GAUZE/BANDAGES/DRESSINGS) IMPLANT
ELECT REM PT RETURN 9FT ADLT (ELECTROSURGICAL) ×2
ELECTRODE REM PT RTRN 9FT ADLT (ELECTROSURGICAL) ×1 IMPLANT
EVACUATOR SILICONE 100CC (DRAIN) IMPLANT
GLOVE BIO SURGEON STRL SZ 6.5 (GLOVE) ×4 IMPLANT
GLOVE BIOGEL PI IND STRL 6.5 (GLOVE) ×2 IMPLANT
GLOVE BIOGEL PI IND STRL 7.0 (GLOVE) ×3 IMPLANT
GLOVE BIOGEL PI IND STRL 7.5 (GLOVE) ×1 IMPLANT
GLOVE BIOGEL PI INDICATOR 6.5 (GLOVE) ×2
GLOVE BIOGEL PI INDICATOR 7.0 (GLOVE) ×3
GLOVE BIOGEL PI INDICATOR 7.5 (GLOVE) ×1
GLOVE ECLIPSE 6.5 STRL STRAW (GLOVE) ×4 IMPLANT
GLOVE SURG SS PI 7.5 STRL IVOR (GLOVE) ×2 IMPLANT
GOWN STRL REUS W/ TWL LRG LVL3 (GOWN DISPOSABLE) ×4 IMPLANT
GOWN STRL REUS W/ TWL XL LVL3 (GOWN DISPOSABLE) ×1 IMPLANT
GOWN STRL REUS W/TWL LRG LVL3 (GOWN DISPOSABLE) ×4
GOWN STRL REUS W/TWL XL LVL3 (GOWN DISPOSABLE) ×1
HEMOSTAT SNOW SURGICEL 2X4 (HEMOSTASIS) ×2 IMPLANT
KIT BASIN OR (CUSTOM PROCEDURE TRAY) ×2 IMPLANT
KIT ROOM TURNOVER OR (KITS) ×2 IMPLANT
LIQUID BAND (GAUZE/BANDAGES/DRESSINGS) ×2 IMPLANT
NS IRRIG 1000ML POUR BTL (IV SOLUTION) ×6 IMPLANT
PACK PERIPHERAL VASCULAR (CUSTOM PROCEDURE TRAY) ×2 IMPLANT
PAD ARMBOARD 7.5X6 YLW CONV (MISCELLANEOUS) ×4 IMPLANT
PATCH VASC XENOSURE 1CMX6CM (Vascular Products) ×1 IMPLANT
PATCH VASC XENOSURE 1X6 (Vascular Products) ×1 IMPLANT
SET COLLECT BLD 21X3/4 12 (NEEDLE) IMPLANT
SPONGE INTESTINAL PEANUT (DISPOSABLE) ×2 IMPLANT
STOPCOCK 4 WAY LG BORE MALE ST (IV SETS) ×2 IMPLANT
SUT ETHILON 3 0 PS 1 (SUTURE) IMPLANT
SUT PROLENE 5 0 C 1 24 (SUTURE) ×14 IMPLANT
SUT PROLENE 6 0 BV (SUTURE) ×2 IMPLANT
SUT PROLENE 7 0 BV1 MDA (SUTURE) ×2 IMPLANT
SUT VIC AB 2-0 CT1 27 (SUTURE) ×1
SUT VIC AB 2-0 CT1 TAPERPNT 27 (SUTURE) ×1 IMPLANT
SUT VIC AB 3-0 SH 27 (SUTURE) ×1
SUT VIC AB 3-0 SH 27X BRD (SUTURE) ×1 IMPLANT
SUT VICRYL 4-0 PS2 18IN ABS (SUTURE) ×2 IMPLANT
SYR 3ML LL SCALE MARK (SYRINGE) ×2 IMPLANT
TRAY FOLEY W/METER SILVER 16FR (SET/KITS/TRAYS/PACK) ×2 IMPLANT
TUBING EXTENTION W/L.L. (IV SETS) IMPLANT
UNDERPAD 30X30 INCONTINENT (UNDERPADS AND DIAPERS) ×2 IMPLANT
WATER STERILE IRR 1000ML POUR (IV SOLUTION) ×2 IMPLANT

## 2015-12-19 NOTE — Telephone Encounter (Addendum)
-----   Message from Mena Goes, RN sent at 12/19/2015 10:42 AM EDT ----- Regarding: 2 weeks postop    ----- Message ----- From: Gabriel Earing, PA-C Sent: 12/19/2015  10:09 AM To: [facility] Charge Pool  S/p right fem endarterectomy 12/19/15.  F/u with Dr. Trula Slade in 2 weeks.  Thanks, Samantha  Appt scheduled for 01/06/16 w/ [facility]/ mailed letter to pt on 12/19/15 and also mailed letter

## 2015-12-19 NOTE — Transfer of Care (Signed)
Immediate Anesthesia Transfer of Care Note  Patient: Brian Brock  Procedure(s) Performed: Procedure(s): ENDARTERECTOMY RIGHT FEMORAL ARTERY (Right) PATCH ANGIOPLASTY RIGHT FEMORAL ARTERY (Right)  Patient Location: PACU  Anesthesia Type:General  Level of Consciousness: awake, oriented and patient cooperative  Airway & Oxygen Therapy: Patient Spontanous Breathing and Patient connected to nasal cannula oxygen  Post-op Assessment: Report given to RN, Post -op Vital signs reviewed and stable and Patient moving all extremities  Post vital signs: Reviewed and stable  Last Vitals:  Vitals:   12/19/15 0548  BP: (!) 152/65  Pulse: (!) 45  Resp: 20    Last Pain:  Vitals:   12/19/15 0548  TempSrc: Oral         Complications: No apparent anesthesia complications

## 2015-12-19 NOTE — Anesthesia Procedure Notes (Signed)
Procedure Name: Intubation Date/Time: 12/19/2015 7:44 AM Performed by: Melina Copa, Yaris Ferrell R Pre-anesthesia Checklist: Patient identified, Emergency Drugs available, Suction available and Patient being monitored Patient Re-evaluated:Patient Re-evaluated prior to inductionOxygen Delivery Method: Circle System Utilized Preoxygenation: Pre-oxygenation with 100% oxygen Intubation Type: IV induction Ventilation: Mask ventilation without difficulty Laryngoscope Size: Mac and 4 Grade View: Grade I Tube type: Oral Tube size: 8.0 mm Number of attempts: 1 Airway Equipment and Method: Stylet Placement Confirmation: ETT inserted through vocal cords under direct vision,  positive ETCO2 and breath sounds checked- equal and bilateral Secured at: 24 cm Tube secured with: Tape Dental Injury: Teeth and Oropharynx as per pre-operative assessment

## 2015-12-19 NOTE — Anesthesia Postprocedure Evaluation (Signed)
Anesthesia Post Note  Patient: Brian Brock  Procedure(s) Performed: Procedure(s) (LRB): ENDARTERECTOMY RIGHT FEMORAL ARTERY (Right) PATCH ANGIOPLASTY RIGHT FEMORAL ARTERY (Right)  Patient location during evaluation: PACU Anesthesia Type: General Level of consciousness: awake and alert Pain management: pain level controlled Vital Signs Assessment: post-procedure vital signs reviewed and stable Respiratory status: spontaneous breathing, nonlabored ventilation, respiratory function stable and patient connected to nasal cannula oxygen Cardiovascular status: blood pressure returned to baseline and stable Postop Assessment: no signs of nausea or vomiting Anesthetic complications: no    Last Vitals:  Vitals:   12/19/15 1116 12/19/15 1131  BP: (!) 109/56 (!) 110/57  Pulse: (!) 43 (!) 54  Resp: 13 13  Temp:      Last Pain:  Vitals:   12/19/15 0548  TempSrc: Leatrice Jewels                 Zenaida Deed

## 2015-12-19 NOTE — H&P (Signed)
[facility]ist of [facility]  Patient name: Brian Brock                MRN: [id number]                  DOB: 10/03/1945                    Sex: male  REFERRING PHYSICIAN: Dr. Karlyne Greenspan  REASON FOR CONSULT: Claudication  HPI: Brian Brock is a 70 y.o. male, who is referred today for evaluation of right leg claudication.  The patient is a patient of Dr. Gwenlyn Found.  He is previously undergone bilateral superficial femoral artery intervention.  He also had a left external iliac stenosis that has been treated with stenting.  He recently underwent angiography which revealed a high-grade right common femoral artery stenosis.  He also had tandem lesions in the right superficial femoral artery.  Have been asked to help with femoral artery reconstruction.  The patient suffers from claudication at approximately 1 block.  It is exacerbated by walking up hill and walking on the beach.  He denies having any ulcers.  He does not have rest pain.  Patient has a history of coronary artery disease, status post CABG as well as percutaneous intervention.  He is medically managed for hyper cholesterolemia with a statin.  He is on dual antiplatelet therapy with aspirin and Plavix.       Past Medical History  Diagnosis Date  . Coronary artery disease     Multiple stents  . COPD (chronic obstructive pulmonary disease) ([place])   . Peripheral vascular disease ([place])     prior stenting  . Dyslipidemia   . Hyperlipemia   . Allergic rhinitis   . Hypertension   . Anginal pain ([place]) 1997  . Childhood asthma   . Chronic bronchitis ([place])     Yearly  . Hypothyroidism   . GERD (gastroesophageal reflux disease)   . Myocardial infarct ([place]) 1997    "mild"  . Arthritis     "lower back" (09/26/2015)          Family History  Problem Relation Age of Onset  . Coronary artery disease Father 57  . Heart attack Father   . Coronary artery disease Brother 62     CABG in 2000    SOCIAL HISTORY: Social History   Social History  . Marital Status: Married    Spouse Name: N/A  . Number of Children: N/A  . Years of Education: N/A      Occupational History  . Not on file.         Social History Main Topics  . Smoking status: Current Some Day Smoker -- 0.25 packs/day for 56 years    Types: Cigarettes    Last Attempt to Quit: 07/24/2014  . Smokeless tobacco: Current User    Types: Chew     Comment: 09/26/2015 "I chew once in awhile"  . Alcohol Use: Yes     Comment: 09/26/2015 "varies; might have a few drinks q 2 months"  . Drug Use: No  . Sexual Activity: Not Currently       Other Topics Concern  . Not on file   Social History Narrative         Allergies  Allergen Reactions  . Metoprolol Other (See Comments)    Fatigue and malaise  . Fish Oil   . Levofloxacin     Unknown reaction  . Meloxicam  Unknown reaction  . Metaxalone     Unknown reaction  . Niaspan [Niacin]   . Sulfonamide Derivatives     unknown reaction - bad rash  . Tricor [Fenofibrate]           Current Outpatient Prescriptions  Medication Sig Dispense Refill  . aspirin EC 81 MG tablet Take 81 mg by mouth daily.    . cetirizine (ZYRTEC) 10 MG tablet Take 10 mg by mouth daily.    . cholestyramine (QUESTRAN) 4 G packet Take 1 packet by mouth 2 (two) times daily with a meal. 60 each 11  . clopidogrel (PLAVIX) 75 MG tablet Take 1 tablet (75 mg total) by mouth daily. 90 tablet 3  . isosorbide mononitrate (IMDUR) 30 MG 24 hr tablet Take 0.5 tablets (15 mg total) by mouth daily. 15 tablet 10  . Lactobacillus (FLORAJEN ACIDOPHILUS PO) Take by mouth daily.    [name] levothyroxine (SYNTHROID, LEVOTHROID) 25 MCG tablet Take 25 mcg by mouth daily before breakfast.     . losartan (COZAAR) 100 MG tablet TAKE ONE TABLET BY MOUTH EVERY DAY. 30 tablet 5  . nebivolol (BYSTOLIC) 5 MG tablet Take 0.5 tablets (2.5 mg total) by  mouth daily. 30 tablet 6  . NITROSTAT 0.4 MG SL tablet Place 1 tablet (0.4 mg total) under the tongue every 5 (five) minutes as needed for chest pain. 25 tablet 3  . pantoprazole (PROTONIX) 40 MG tablet Take 1 tablet by mouth 2 (two) times daily.     . rosuvastatin (CRESTOR) 20 MG tablet Take 1 tablet (20 mg total) by mouth daily. 90 tablet 0  . Albuterol Sulfate (PROAIR RESPICLICK) 123XX123 (90 BASE) MCG/ACT AEPB Inhale 2 puffs into the lungs 4 (four) times daily as needed. (Patient not taking: Reported on 10/28/2015) 1 each prn  . Cyanocobalamin (VITAMIN B-12 PO) Take 1 tablet by mouth daily. Reported on 10/28/2015    . cyclobenzaprine (FLEXERIL) 10 MG tablet Take 10 mg by mouth 3 (three) times daily as needed for muscle spasms. Reported on 10/28/2015    . nabumetone (RELAFEN) 500 MG tablet Take 1 tablet by mouth 2 (two) times daily. Reported on 10/28/2015     No current facility-administered medications for this visit.    REVIEW OF SYSTEMS:  [X]  denotes positive finding, [ ]  denotes negative finding Cardiac  Comments:  Chest pain or chest pressure:    Shortness of breath upon exertion: x   Short of breath when lying flat:    Irregular heart rhythm:        Vascular    Pain in calf, thigh, or hip brought on by ambulation:    Pain in feet at night that wakes you up from your sleep:     Blood clot in your veins:    Leg swelling:         Pulmonary    Oxygen at home:    Productive cough:     Wheezing:         Neurologic    Sudden weakness in arms or legs:     Sudden numbness in arms or legs:     Sudden onset of difficulty speaking or slurred speech:    Temporary loss of vision in one eye:     Problems with dizziness:         Gastrointestinal    Blood in stool:     Vomited blood:         Genitourinary    Burning when urinating:  Blood in urine:        Psychiatric    Major depression:           Hematologic    Bleeding problems:    Problems with blood clotting too easily:        Skin    Rashes or ulcers:        Constitutional    Fever or chills:      PHYSICAL EXAM:    Filed Vitals:   10/28/15 1358  BP: 102/62  Pulse: 52  Temp: 97.8 F (36.6 C)  TempSrc: Oral  Resp: 16  Height: 5\' 9"  (1.753 m)  Weight: 195 lb (88.451 kg)  SpO2: 95%    GENERAL: The patient is a well-nourished male, in no acute distress. The vital signs are documented above. CARDIAC: There is a regular rate and rhythm.  VASCULAR: Faintly palpable right femoral pulse PULMONARY: There is good air exchange bilaterally without wheezing or rales. ABDOMEN: Soft and non-tender with normal pitched bowel sounds.  MUSCULOSKELETAL: There are no major deformities or cyanosis. NEUROLOGIC: No focal weakness or paresthesias are detected. SKIN: There are no ulcers or rashes noted. PSYCHIATRIC: The patient has a normal affect.  DATA:  I have reviewed his arteriogram which shows high-grade calcification within the right common femoral artery in addition to tandem lesions within the right superficial femoral artery  MEDICAL ISSUES: Right leg claudication.  We discussed proceeding with right common femoral endarterectomy.  He will then later undergo percutaneous intervention by Dr. Gwenlyn Found to address to tandem lesions in the right superficial femoral artery.  He'll need to be off his Plavix for 1 week prior to his operation which she would like scheduled at the end of July or early August.  We discussed the risks of wound complications as well as need for recurrent interventions.  All his questions were answered today.   Annamarie Major, MD [facility] Specialists of [facility] [phone] Pager [phone] n    No interval changes CV RRR Pulm CTA ABD soft Plan right femoral endarterectomy with patch. All questions answered  Annamarie Major

## 2015-12-20 ENCOUNTER — Encounter (HOSPITAL_COMMUNITY): Payer: Self-pay | Admitting: Surgery

## 2015-12-20 LAB — BASIC METABOLIC PANEL
Anion gap: 8 (ref 5–15)
BUN: 9 mg/dL (ref 6–20)
CO2: 24 mmol/L (ref 22–32)
Calcium: 8.3 mg/dL — ABNORMAL LOW (ref 8.9–10.3)
Chloride: 107 mmol/L (ref 101–111)
Creatinine, Ser: 1.02 mg/dL (ref 0.61–1.24)
GFR calc Af Amer: >60 mL/min (ref >60)
GFR calc non Af Amer: >60 mL/min (ref >60)
Glucose, Bld: 101 mg/dL — ABNORMAL HIGH (ref 65–99)
Potassium: 3.6 mmol/L (ref 3.5–5.1)
Sodium: 139 mmol/L (ref 135–145)

## 2015-12-20 LAB — CBC
HCT: 34.6 % — ABNORMAL LOW (ref 39.0–52.0)
Hemoglobin: 11.6 g/dL — ABNORMAL LOW (ref 13.0–17.0)
MCH: 28.5 pg (ref 26.0–34.0)
MCHC: 33.5 g/dL (ref 30.0–36.0)
MCV: 85 fL (ref 78.0–100.0)
Platelets: 196 10*3/uL (ref 150–400)
RBC: 4.07 MIL/uL — ABNORMAL LOW (ref 4.22–5.81)
RDW: 14.1 % (ref 11.5–15.5)
WBC: 10.6 10*3/uL — ABNORMAL HIGH (ref 4.0–10.5)

## 2015-12-20 MED ORDER — OXYCODONE-ACETAMINOPHEN 5-325 MG PO TABS: 1.0000 | ORAL_TABLET | Freq: Four times a day (QID) | ORAL | 0 refills | Status: DC | PRN

## 2015-12-20 NOTE — Evaluation (Signed)
Occupational Therapy Evaluation Patient Details Name: Brian Brock MRN: [id number] DOB: 01/13/1946 Today's Date: 12/20/2015    History of Present Illness 70 yo male s/p R femoral endarterectomy with bovine patch angioplasty. PMH MI CAD s/p LAD RCA stents  CABG PAD COPD HTN GERD hiatal hernia    Clinical Impression   Patient evaluated by Occupational Therapy with no further acute OT needs identified. All education has been completed and the patient has no further questions. See below for any follow-up Occupational Therapy or equipment needs. OT to sign off. Thank you for referral.      Follow Up Recommendations  No OT follow up    Equipment Recommendations  None recommended by OT    Recommendations for Other Services       Precautions / Restrictions Precautions Precautions: None      Mobility Bed Mobility               General bed mobility comments: in chair on arrival  Transfers Overall transfer level: Modified independent                    Balance                                            ADL Overall ADL's : Modified independent                                       General ADL Comments: able to reach LB, educated on dressing R Le first, has wife (A) for adls if needed, able to complete toilet transfer. pt voiding 150 ML urine during session RN aware     Vision     Perception     Praxis      Pertinent Vitals/Pain Pain Assessment: No/denies pain     Hand Dominance Right   Extremity/Trunk Assessment Upper Extremity Assessment Upper Extremity Assessment: Overall WFL for tasks assessed   Lower Extremity Assessment Lower Extremity Assessment: Defer to PT evaluation   Cervical / Trunk Assessment Cervical / Trunk Assessment: Normal   Communication Communication Communication: No difficulties   Cognition Arousal/Alertness: Awake/alert Behavior During Therapy: WFL for tasks  assessed/performed Overall Cognitive Status: Within Functional Limits for tasks assessed                     General Comments       Exercises       Shoulder Instructions      Home Living Family/patient expects to be discharged to:: Private residence Living Arrangements: Spouse/significant other Available Help at Discharge: Family Type of Home: House Home Access: Level entry     Home Layout: One level     Bathroom Shower/Tub: Occupational psychologist: Handicapped height     Home Equipment:  (walking stick)   Additional Comments: cleans up construction left after the building of townhomes in his neighborhood      Prior Functioning/Environment Level of Independence: Independent             OT Diagnosis:     OT Problem List:     OT Treatment/Interventions:      OT Goals(Current goals can be found in the care plan section)    OT Frequency:     Barriers to D/C:  Co-evaluation              End of Session Nurse Communication: Mobility status;Precautions  Activity Tolerance: Patient tolerated treatment well Patient left: in chair;with call bell/phone within reach   Time: 0950-1007 OT Time Calculation (min): 17 min Charges:  OT General Charges $OT Visit: 1 Procedure OT Evaluation $OT Eval Moderate Complexity: 1 Procedure G-Codes:    Parke Poisson B Dec 29, 2015, 10:27 AM   Jeri Modena   OTR/L Pager: [phone] Office: [phone] .

## 2015-12-20 NOTE — Discharge Summary (Signed)
Discharge Summary     Brian Brock 04-20-1946 70 y.o. male  [id number]  Admission Date: 12/19/2015  Discharge Date: 12/20/15  Physician: Serafina Mitchell, MD  Admission Diagnosis: Peripheral vascular disease with right lower extremity claudication I70.211   HPI:   This is a 70 y.o. male who is referred today for evaluation of right leg claudication. The patient is a patient of Dr. Gwenlyn Found. He is previously undergone bilateral superficial femoral artery intervention. He also had a left external iliac stenosis that has been treated with stenting. He recently underwent angiography which revealed a high-grade right common femoral artery stenosis. He also had tandem lesions in the right superficial femoral artery. Have been asked to help with femoral artery reconstruction. The patient suffers from claudication at approximately 1 block. It is exacerbated by walking up hill and walking on the beach. He denies having any ulcers. He does not have rest pain.  Patient has a history of coronary artery disease, status post CABG as well as percutaneous intervention. He is medically managed for hyper cholesterolemia with a statin. He is on dual antiplatelet therapy with aspirin and Plavix.  Hospital Course:  The patient was admitted to the hospital and taken to the operating room on 12/19/2015 and underwent: Right femoral endarterectomy with bovine patch angioplasty.    Intraoperative findings included the following:  Extensive plaque throughout the right common femoral artery extending into the superficial femoral and profunda femoral artery as well as up into the external iliac artery.  This was removed in its entirety, and patch angioplasty was performed.    The pt tolerated the procedure well and was transported to the PACU in good condition.   By POD 1, he is doing well with brisk doppler signal right DP and monophasic doppler signals in the right PT and peroneal.  His foley was  removed and he was able to void.   He was discharged home.   The remainder of the hospital course consisted of increasing mobilization and increasing intake of solids without difficulty.  CBC    Component Value Date/Time   WBC 10.6 (H) 12/20/2015 0545   RBC 4.07 (L) 12/20/2015 0545   HGB 11.6 (L) 12/20/2015 0545   HCT 34.6 (L) 12/20/2015 0545   PLT 196 12/20/2015 0545   MCV 85.0 12/20/2015 0545   MCH 28.5 12/20/2015 0545   MCHC 33.5 12/20/2015 0545   RDW 14.1 12/20/2015 0545   LYMPHSABS 2.9 09/05/2012 1210   MONOABS 1.0 09/05/2012 1210   EOSABS 0.1 09/05/2012 1210   BASOSABS 0.0 09/05/2012 1210    BMET    Component Value Date/Time   NA 139 12/20/2015 0545   K 3.6 12/20/2015 0545   CL 107 12/20/2015 0545   CO2 24 12/20/2015 0545   GLUCOSE 101 (H) 12/20/2015 0545   BUN 9 12/20/2015 0545   CREATININE 1.02 12/20/2015 0545   CREATININE 1.05 07/17/2014 1016   CALCIUM 8.3 (L) 12/20/2015 0545   GFRNONAA >60 12/20/2015 0545   GFRAA >60 12/20/2015 0545     Discharge Instructions    Call MD for:  redness, tenderness, or signs of infection (pain, swelling, bleeding, redness, odor or green/yellow discharge around incision site)    Complete by:  As directed   Call MD for:  severe or increased pain, loss or decreased feeling  in affected limb(s)    Complete by:  As directed   Call MD for:  temperature >100.5    Complete by:  As directed  Discharge wound care:    Complete by:  As directed   Wash the groin wound with soap and water daily and pat dry. (No tub bath-only shower)  Then put a dry gauze or washcloth there to keep this area dry daily and as needed.  Do not use Vaseline or neosporin on your incisions.  Only use soap and water on your incisions and then protect and keep dry.   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 PAD (peripheral artery disease) ([place]) 12/19/2015  . Claudication ([place]) 09/11/2015  . Obstructive chronic bronchitis without exacerbation ([place]) 03/09/2015  . CAD (coronary artery disease) of artery bypass graft 07/24/2014  . Coronary artery disease involving coronary bypass graft with other forms of angina pectoris ([place])   . Cardiovascular stress test abnormal 07/17/2014  . Hyperlipidemia LDL goal <70 02/15/2014  . Palpitations 02/15/2014  . Metabolic syndrome 123456  . HTN (hypertension) 04/15/2013  . Preoperative clearance 04/15/2013  . Bradycardia 11/04/2012  . Dizziness 11/04/2012  . PAF (paroxysmal atrial fibrillation) ([place]) 11/04/2012  . Hypotension- B/P 99991111- systolic [id number]  . CAD (coronary artery disease), native coronary artery - Severe Ostial & Proximal LAD ~90%; Ostial Circumflex ostial ~60%; Plan CABG 09/10/2012  . Elevated LFTs 09/06/2012  .  EKG abnormality- transient TWI 09/06/2012  . PVD- Lt SFA stent '04, Rt SFA stent '08 09/06/2012  . CAD- CABG X 17 August 2012 09/05/2012  . Unstable angina ([place]) 09/05/2012  . DYSPNEA 06/26/2009  . Hyperlipidemia 05/23/2009  . Tobacco use disorder in remission 05/23/2009  . Bronchitis, chronic obstructive, with exacerbation ([place]) 05/23/2009   Past Medical History:  Diagnosis Date  . Allergic rhinitis   . Anginal pain ([place]) 1997  . Arthritis    "lower back" (09/26/2015)  . Childhood asthma   . Chronic bronchitis ([place])    Yearly  . COPD (chronic obstructive pulmonary disease) ([place])   . Coronary artery disease    Multiple stents  . Dyslipidemia   . Dysrhythmia   . GERD (gastroesophageal reflux disease)   . History of hiatal hernia   . Hyperlipemia   . Hypertension   . Hypothyroidism   . Myocardial infarct ([place]) 1997    "mild"  . Peripheral vascular disease ([place])    prior stenting       Medication List    TAKE these medications   aspirin EC 81 MG tablet Take 81 mg by mouth daily.   cetirizine 10 MG tablet Commonly known as:  ZYRTEC Take 10 mg by mouth daily.   cholestyramine 4 g packet Commonly known as:  QUESTRAN Take 1 packet by mouth 2 (two) times daily with a meal.   clopidogrel 75 MG tablet Commonly known as:  PLAVIX Take 1 tablet (75 mg total) by mouth daily.   FLORAJEN ACIDOPHILUS PO Take by mouth daily.   isosorbide mononitrate 30 MG 24 hr tablet Commonly known as:  IMDUR Take 0.5 tablets (15 mg total) by mouth daily.   levothyroxine 25 MCG tablet Commonly known as:  SYNTHROID, LEVOTHROID Take 25 mcg by mouth daily  before breakfast.   losartan 100 MG tablet Commonly known as:  COZAAR TAKE ONE TABLET BY MOUTH EVERY DAY.   nebivolol 5 MG tablet Commonly known as:  BYSTOLIC Take 0.5 tablets (2.5 mg total) by mouth daily.   NITROSTAT 0.4 MG SL tablet Generic drug:  nitroGLYCERIN Place 1 tablet (0.4 mg total) under the tongue every 5 (five) minutes as needed for chest pain.   oxyCODONE-acetaminophen 5-325 MG tablet Commonly known as:  PERCOCET/ROXICET Take 1 tablet by mouth every 6 (six) hours as needed for moderate pain.   pantoprazole 40 MG tablet Commonly known as:  PROTONIX Take 1 tablet by mouth 2 (two) times daily.   rosuvastatin 20 MG tablet Commonly known as:  CRESTOR Take 1 tablet (20 mg total) by mouth daily.       Prescriptions given: 1.  Percocet #20 No Refill  Instructions: 1.  Wash the groin wound with soap and water daily and pat dry. (No tub bath-only shower)  Then put a dry gauze or washcloth there to keep this area dry daily and as needed.  Do not use Vaseline or neosporin on your incisions.  Only use soap and water on your incisions and then protect and keep dry.  Disposition: home  Patient's condition: is Good  Follow up: 1. Dr. Trula Slade  in 2 weeks   Leontine Locket, PA-C [facility] [phone] 12/20/2015  7:35 AM  - For VQI Registry use --- Instructions: Press F2 to tab through selections.  Delete question if not applicable.   Post-op:  Wound infection: No  Graft infection: No  Transfusion: No  If yes, n/a units given New Arrhythmia: No Ipsilateral amputation: No, [ ]  Minor, [ ]  BKA, [ ]  AKA Discharge patency: [x ] Primary, [ ]  Primary assisted, [ ]  Secondary, [ ]  Occluded Patency judged by: [x ] Dopper only (faintly palpable right DP), [ ]  Palpable graft pulse, []  Palpable distal pulse, [ ]  ABI inc. > 0.15, [ ]  Duplex Discharge ABI: R not done, L  D/C Ambulatory Status: Ambulatory  Complications: MI: No, [ ]  Troponin only, [ ]  EKG or Clinical CHF: No Resp failure:No, [ ]  Pneumonia, [ ]  Ventilator Chg in renal function: No, [ ]  Inc. Cr > 0.5, [ ]  Temp. Dialysis, [ ]  Permanent dialysis Stroke: No, [ ]  Minor, [ ]  Major Return to OR: No  Reason for return to OR: [ ]  Bleeding, [ ]  Infection, [ ]  Thrombosis, [ ]  Revision  Discharge medications: Statin use:  yes ASA use:  yes Plavix use:  yes Beta blocker use: yes ACEI use:   no ARB use:  yes Coumadin use: no

## 2015-12-20 NOTE — Progress Notes (Signed)
Received order to discharge patient to home.  Patient voided prior to discharge.  Provided education to patient and patients wife.  Answered all questions and concerns.

## 2015-12-20 NOTE — Op Note (Addendum)
    Patient name: Brian Brock MRN: [id number] DOB: 1946/04/08 Sex: male  12/19/2015 Pre-operative Diagnosis: Right leg claudication  Post-operative diagnosis:  Same Surgeon:  Annamarie Major Assistants:  Gerri Lins Procedure:   Right external iliac, common femoral, profundofemoral, and superficial femoral artery endarterectomy with bovine pericardial patch and plasty Anesthesia:  Gen. Blood Loss:  See anesthesia record Specimens:  None  Findings:  Extensive plaque throughout the right common femoral artery extending into the superficial femoral and profunda femoral artery as well as up into the external iliac artery.  This was removed in its entirety, and patch angioplasty was performed  Indications:  The patient has undergone angiography by Dr. Gwenlyn Found for evaluation of claudication.  A high-grade lesion was found in the right common femoral artery.  He is referred for surgical intervention.  The risks and benefits were discussed the patient  Procedure:  The patient was identified in the holding area and taken to [place] [facility]  The patient was then placed supine on the table. general anesthesia was administered.  The patient was prepped and draped in the usual sterile fashion.  A time out was called and antibiotics were administered.  Ultrasound was used to evaluate the common femoral artery and locate the bifurcation.  A longitudinal incision was made in the right groin.  Cautery was used to divide the subcutaneous tissue down to the femoral sheath which was opened sharply.  The common femoral profunda femoris superficial femoral artery were all dissected out sharply.  There was very calcified plaque.  Vessels were isolated with Vesseloops.  The patient was fully heparinized.  A prep circulated the distal external iliac,, common femoral profunda femoral and superficial femoral artery were all occluded as were the side branches.  A #11 blade was used to make an arteriotomy which was extended  longitudinally with Potts scissors.  A Marlene Bast elevator was used to perform endarterectomy.  A good distal endpoint in the superficial femoral artery was obtained and the endpoint was tacked down with 7-0 Prolene suture.  Similarly, a good endpoint in the proximal profunda femoral artery was identified and this was also tacked down with 7-0 Prolene.  I then inflated a #4 Fogarty catheter in the external iliac artery so I could visualize the distal external iliac artery and the plaque was removed with hemostat.  There was excellent inflow.  The endarterectomized bed was copiously irrigated.  All potential embolic debris was removed.  A bovine pericardial patch was selected and patch angioplasty was performed with running 5-0 Prolene.  Prior to completion, the appropriate flushing maneuvers were performed the anastomosis was completed.  There were excellent Doppler signals in all interrogated vessels.  50 mg of protamine was given.  Once hemostasis was satisfactory the femoral sheath was reapproximated with 2-0 Vicryl.  The subcutaneous tissue was then closed in multiple layers of 3-0 Vicryl followed by 4-0 Vicryl on the skin.  Dermabond was applied.  There were no immediate complications   Disposition:  To PACU in stable condition.   Theotis Burrow, M.D. [facility] [place] Office: [phone] Pager:  [phone]

## 2015-12-20 NOTE — Progress Notes (Signed)
  Progress Note    12/20/2015 7:19 AM 1 Day Post-Op  Subjective:  No complaints-ready to go home  Afebrile HR  40's-50's SB 99991111 systolic [id number] RA  Vitals:   12/20/15 0000 12/20/15 0505  BP: (!) 121/53 (!) 113/55  Pulse: (!) 53 69  Resp: 18 20  Temp: 98.3 F (36.8 C) 98.4 F (36.9 C)    Physical Exam: Cardiac:  regular Lungs:  Non labored Incisions:  Right groin incision is clean and dry without hematoma Extremities:  Brisk doppler signal right DP (faintly palpable) and monophasic doppler signals right PT/peroneal   CBC    Component Value Date/Time   WBC 10.6 (H) 12/20/2015 0545   RBC 4.07 (L) 12/20/2015 0545   HGB 11.6 (L) 12/20/2015 0545   HCT 34.6 (L) 12/20/2015 0545   PLT 196 12/20/2015 0545   MCV 85.0 12/20/2015 0545   MCH 28.5 12/20/2015 0545   MCHC 33.5 12/20/2015 0545   RDW 14.1 12/20/2015 0545   LYMPHSABS 2.9 09/05/2012 1210   MONOABS 1.0 09/05/2012 1210   EOSABS 0.1 09/05/2012 1210   BASOSABS 0.0 09/05/2012 1210    BMET    Component Value Date/Time   NA 139 12/20/2015 0545   K 3.6 12/20/2015 0545   CL 107 12/20/2015 0545   CO2 24 12/20/2015 0545   GLUCOSE 101 (H) 12/20/2015 0545   BUN 9 12/20/2015 0545   CREATININE 1.02 12/20/2015 0545   CREATININE 1.05 07/17/2014 1016   CALCIUM 8.3 (L) 12/20/2015 0545   GFRNONAA >60 12/20/2015 0545   GFRAA >60 12/20/2015 0545    INR    Component Value Date/Time   INR 1.03 12/11/2015 1038     Intake/Output Summary (Last 24 hours) at 12/20/15 0719 Last data filed at 12/20/15 0505  Gross per 24 hour  Intake          2688.75 ml  Output             1150 ml  Net          1538.75 ml     Assessment:  70 y.o. male is s/p:  Right femoral endarterectomy with bovine patch angioplasty 1 Day Post-Op  Plan: -pt doing well this morning with Brisk doppler signal right DP (faintly palpable) and monophasic doppler signals right PT/peroneal -he has ambulated, but has not voided since the catheter was  removed -will d/c home today after he voids and f/u with Dr. Trula Slade in 2 weeks. -discussed groin wound care with pt and he expressed understanding.   Leontine Locket, PA-C [facility] [phone] 12/20/2015 7:19 AM   Looks great.  Needs to void prior to discharge  WB

## 2015-12-21 NOTE — Care Management Note (Signed)
Case Management Note  Patient Details  Name: DARWYN CALENDINE MRN: [id number] Date of Birth: 10-21-1945  Subjective/Objective:  Patient is from home, he was active with [facility] for [facility] and [facility],  NCM spoke with patient on the phone and he states he feels like he does not need to resumed the services,  NCM let Abby know with [facility].                  Action/Plan:   Expected Discharge Date:                  Expected Discharge Plan:  Home/Self Care  In-House Referral:     Discharge planning Services  CM Consult  Post Acute Care Choice:    Choice offered to:     DME Arranged:    DME Agency:     HH Arranged:    HH Agency:     Status of Service:  Completed, signed off  If discussed at [employer] of Stay Meetings, dates discussed:    Additional Comments:  Zenon Mayo, RN 12/21/2015, 11:17 AM

## 2015-12-31 ENCOUNTER — Encounter: Payer: Self-pay | Admitting: Surgery

## 2016-01-06 ENCOUNTER — Ambulatory Visit (INDEPENDENT_AMBULATORY_CARE_PROVIDER_SITE_OTHER): Payer: Medicare Other | Admitting: Surgery

## 2016-01-06 ENCOUNTER — Encounter: Payer: Self-pay | Admitting: Surgery

## 2016-01-06 VITALS — BP 106/59 | HR 56 | Temp 97.4°F | Resp 18 | Ht 69.0 in | Wt 188.5 lb

## 2016-01-06 DIAGNOSIS — I70211 Atherosclerosis of native arteries of extremities with intermittent claudication, right leg: Secondary | ICD-10-CM

## 2016-01-06 NOTE — Progress Notes (Signed)
Patient name: Brian Brock MRN: [id number] DOB: [birth year] Sex: male  REASON FOR VISIT: Postop follow-up  HPI: Brian Brock is a 70 y.o. male who returns today for his first postoperative follow-up.  On 12/19/2015 he underwent a right iliofemoral endarterectomy with patch angioplasty.  He has recovered from his surgery nicely.  He does have some complaints of numbness along the medial side of his leg extending into his testicle as well as some pins and needles around his knee.  He is slowly increasing his activity.  His pain was controlled.  Current Outpatient Prescriptions  Medication Sig Dispense Refill  . aspirin EC 81 MG tablet Take 81 mg by mouth daily.    . cetirizine (ZYRTEC) 10 MG tablet Take 10 mg by mouth daily.    . cholestyramine (QUESTRAN) 4 G packet Take 1 packet by mouth 2 (two) times daily with a meal. 60 each 11  . clopidogrel (PLAVIX) 75 MG tablet Take 1 tablet (75 mg total) by mouth daily. 90 tablet 3  . isosorbide mononitrate (IMDUR) 30 MG 24 hr tablet Take 0.5 tablets (15 mg total) by mouth daily. 15 tablet 10  . Lactobacillus (FLORAJEN ACIDOPHILUS PO) Take by mouth daily.    [name] levothyroxine (SYNTHROID, LEVOTHROID) 25 MCG tablet Take 25 mcg by mouth daily before breakfast.     . losartan (COZAAR) 100 MG tablet TAKE ONE TABLET BY MOUTH EVERY DAY. 30 tablet 5  . nebivolol (BYSTOLIC) 5 MG tablet Take 0.5 tablets (2.5 mg total) by mouth daily. 30 tablet 6  . NITROSTAT 0.4 MG SL tablet Place 1 tablet (0.4 mg total) under the tongue every 5 (five) minutes as needed for chest pain. 25 tablet 3  . oxyCODONE-acetaminophen (PERCOCET/ROXICET) 5-325 MG tablet Take 1 tablet by mouth every 6 (six) hours as needed for moderate pain. 20 tablet 0  . pantoprazole (PROTONIX) 40 MG tablet Take 1 tablet by mouth 2 (two) times daily.     . rosuvastatin (CRESTOR) 20 MG tablet Take 1 tablet (20 mg total) by mouth daily. 90 tablet 3   No current  facility-administered medications for this visit.     REVIEW OF SYSTEMS:  [X]  denotes positive finding, [ ]  denotes negative finding Cardiac  Comments:  Chest pain or chest pressure:    Shortness of breath upon exertion:    Short of breath when lying flat:    Irregular heart rhythm:    Constitutional    Fever or chills:      PHYSICAL EXAM: Vitals:   01/06/16 0807  BP: (!) 106/59  Pulse: (!) 56  Resp: 18  Temp: 97.4 F (36.3 C)  TempSrc: Oral  SpO2: 96%  Weight: 188 lb 8 oz (85.5 kg)  Height: 5\' 9"  (1.753 m)    GENERAL: The patient is a well-nourished male, in no acute distress. The vital signs are documented above. CARDIOVASCULAR: There is a regular rate and rhythm. PULMONARY: There is good air exchange bilaterally without wheezing or rales. Right groin incision is healing nicely with no areas of separation.  There is no erythema.  There is a palpable pulse.  MEDICAL ISSUES: Status post right iliofemoral endarterectomy with bovine pericardial patch angioplasty.  The patient has typical postoperative issues.  I suspect most of these will resolve with time.  I have him scheduled to follow up with me again in 3 months.  He is scheduled to see Dr. Gwenlyn Found in September  Annamarie Major, MD [facility] (  [phone] Pager [phone]

## 2016-01-28 ENCOUNTER — Ambulatory Visit: Payer: Medicare Other | Admitting: Cardiovascular Disease

## 2016-02-04 ENCOUNTER — Ambulatory Visit (INDEPENDENT_AMBULATORY_CARE_PROVIDER_SITE_OTHER): Payer: Medicare Other | Admitting: Cardiovascular Disease

## 2016-02-04 ENCOUNTER — Encounter: Payer: Self-pay | Admitting: Cardiovascular Disease

## 2016-02-04 ENCOUNTER — Other Ambulatory Visit: Payer: Self-pay | Admitting: *Deleted

## 2016-02-04 VITALS — BP 128/67 | HR 57 | Ht 69.0 in | Wt 187.6 lb

## 2016-02-04 DIAGNOSIS — I70211 Atherosclerosis of native arteries of extremities with intermittent claudication, right leg: Secondary | ICD-10-CM | POA: Diagnosis not present

## 2016-02-04 DIAGNOSIS — I739 Peripheral vascular disease, unspecified: Secondary | ICD-10-CM | POA: Diagnosis not present

## 2016-02-04 DIAGNOSIS — Z01818 Encounter for other preprocedural examination: Secondary | ICD-10-CM | POA: Diagnosis not present

## 2016-02-04 DIAGNOSIS — D689 Coagulation defect, unspecified: Secondary | ICD-10-CM | POA: Diagnosis not present

## 2016-02-04 NOTE — Assessment & Plan Note (Signed)
[name] has a history of PAD. He is status post left external iliac artery stenting 09/26/15. He had moderate segmental disease in his left SFA with two-vessel runoff. He also had a 90% calcified exophytic plaque in his right common femoral artery, 90% "in-stent restenosis were copiously placed mid right SFA stent and 80% stenosis just proximal to stent, 95% calcified stenosis distal to the stent and three-vessel runoff. He essentially underwent right common femoral artery endarterectomy and patch angioplasty by Dr. Trula Slade 12/20/15 which moderately improved claudication on the right side. He still has residual disease in his mid right SFA which he desires to have percutaneously revascularized. I will schedule this sometime in October.

## 2016-02-04 NOTE — Patient Instructions (Signed)
Medication Instructions:   NO CHANGES.   Testing/Procedures: Dr. Gwenlyn Found has ordered a peripheral angiogram to be done at [facility].  This procedure is going to look at the bloodflow in your lower extremities.  If Dr. Gwenlyn Found is able to open up the arteries, you will have to spend one night in the hospital.  If he is not able to open the arteries, you will be able to go home that same day.    After the procedure, you will not be allowed to drive for 3 days or push, pull, or lift anything greater than 10 lbs for one week.    You will be required to have the following tests prior to the procedure:  1. Blood work-the blood work can be done no more than 14 days prior to the procedure.  It can be done at any [facility] lab.  There is one downstairs on the first floor of this building and one in the [facility] building [id number], [address])  2. Chest Xray-the chest xray order has already been placed at the [facility].     *REPS  NONE AT THIS TIME PER DR BERRY.  Puncture site LEFT GROIN    FOLLOW UP: Your physician recommends that you schedule a follow-up appointment WITH DR Claiborne Billings IN October 2017.    If you need a refill on your cardiac medications before your next appointment, please call your pharmacy.

## 2016-02-04 NOTE — Progress Notes (Signed)
02/04/2016 Brian Brock   1946-05-12  [id number]  Primary Physician Brian Broker, MD Primary Cardiologist: Brian Harp MD Brian Brock  HPI:  Brian Brock is a 70 year old gentleman with a history of CAD status post bypass grafting in the past and subsequent LAD intervention by Brian Brock. He has had bilateral SFA stents placed as well.[name] He's had right greater than left leg; occasional with recent Doppler studies suggest a high-frequency signal in his mid right SFA. He underwent peripheral angiography and intervention by myself 09/26/15 which time I stented his left external iliac artery. I did demonstrate 5060% segmental stenosis of his mid left SFA with two-vessel runoff. He had a 90% calcified exophytic plaque in his right common femoral artery, 90% "in-stent restenosis within his replaced right SFA stent for any percent focal stenosis just proximal to the stent, 95% calcified stenosis distal to the stent with two-vessel runoff. He essentially underwent right common femoral endarterectomy and patch angioplasty by Brian Brock A/4/17 which moderately improved his claudication..   Current Outpatient Prescriptions  Medication Sig Dispense Refill  . aspirin EC 81 MG tablet Take 81 mg by mouth daily.    . cetirizine (ZYRTEC) 10 MG tablet Take 10 mg by mouth daily.    . cholestyramine (QUESTRAN) 4 G packet Take 1 packet by mouth 2 (two) times daily with a meal. 60 each 11  . clopidogrel (PLAVIX) 75 MG tablet Take 1 tablet (75 mg total) by mouth daily. 90 tablet 3  . isosorbide mononitrate (IMDUR) 30 MG 24 hr tablet Take 0.5 tablets (15 mg total) by mouth daily. 15 tablet 10  . Lactobacillus (FLORAJEN ACIDOPHILUS PO) Take by mouth daily.    [name] levothyroxine (SYNTHROID, LEVOTHROID) 25 MCG tablet Take 25 mcg by mouth daily before breakfast.     . losartan (COZAAR) 100 MG tablet TAKE ONE TABLET BY MOUTH EVERY DAY. 30 tablet 5  . nebivolol (BYSTOLIC) 5 MG tablet Take 0.5  tablets (2.5 mg total) by mouth daily. 30 tablet 6  . NITROSTAT 0.4 MG SL tablet Place 1 tablet (0.4 mg total) under the tongue every 5 (five) minutes as needed for chest pain. 25 tablet 3  . pantoprazole (PROTONIX) 40 MG tablet Take 1 tablet by mouth 2 (two) times daily.     . rosuvastatin (CRESTOR) 20 MG tablet Take 1 tablet (20 mg total) by mouth daily. 90 tablet 3   No current facility-administered medications for this visit.     Allergies  Allergen Reactions  . Metoprolol Other (See Comments)    Fatigue and malaise  . Fish Oil   . Levofloxacin     Unknown reaction  . Meloxicam     Unknown reaction  . Metaxalone     Unknown reaction  . Niaspan [Niacin]   . Sulfonamide Derivatives     unknown reaction - bad rash  . Tricor [Fenofibrate]     Social History   Social History  . Marital status: Married    Spouse name: N/A  . Number of children: N/A  . Years of education: N/A   Occupational History  . Not on file.   Social History Main Topics  . Smoking status: Current Some Day Smoker    Packs/day: 0.50    Years: 56.00    Types: Cigarettes    Last attempt to quit: 07/24/2014  . Smokeless tobacco: Current User    Types: Chew     Comment: 09/26/2015 "I chew once in awhile"  .  Alcohol use Yes     Comment: 09/26/2015 "varies; might have a few drinks q 2 months"  . Drug use: No  . Sexual activity: Not Currently   Other Topics Concern  . Not on file   Social History Narrative  . No narrative on file     Review of Systems: General: negative for chills, fever, night sweats or weight changes.  Cardiovascular: negative for chest pain, dyspnea on exertion, edema, orthopnea, palpitations, paroxysmal nocturnal dyspnea or shortness of breath Dermatological: negative for rash Respiratory: negative for cough or wheezing Urologic: negative for hematuria Abdominal: negative for nausea, vomiting, diarrhea, bright red blood per rectum, melena, or hematemesis Neurologic: negative  for visual changes, syncope, or dizziness All other systems reviewed and are otherwise negative except as noted above.    Blood pressure 128/67, pulse (!) 57, height 5\' 9"  (1.753 m), weight 187 lb 9.6 oz (85.1 kg).  General appearance: alert and no distress Neck: no adenopathy, no carotid bruit, no JVD, supple, symmetrical, trachea midline and thyroid not enlarged, symmetric, no tenderness/mass/nodules Lungs: clear to auscultation bilaterally Heart: regular rate and rhythm, S1, S2 normal, no murmur, click, rub or gallop Extremities: His right common femoral endarterectomy incision is well-healed without bruit. He has 2+ pulses there.  EKG not performed today  ASSESSMENT AND PLAN:   PVD- Lt SFA stent '04, Rt SFA stent '08 Brian Brock has a history of PAD. He is status post left external iliac artery stenting 09/26/15. He had moderate segmental disease in his left SFA with two-vessel runoff. He also had a 90% calcified exophytic plaque in his right common femoral artery, 90% "in-stent restenosis were copiously placed mid right SFA stent and 80% stenosis just proximal to stent, 95% calcified stenosis distal to the stent and three-vessel runoff. He essentially underwent right common femoral artery endarterectomy and patch angioplasty by Brian Brock 12/20/15 which moderately improved claudication on the right side. He still has residual disease in his mid right SFA which he desires to have percutaneously revascularized. I will schedule this sometime in October.      Brian Harp MD FACP,FACC,FAHA, [facility] 02/04/2016 9:58 AM

## 2016-02-05 ENCOUNTER — Telehealth: Payer: Self-pay | Admitting: Cardiovascular Disease

## 2016-02-05 ENCOUNTER — Other Ambulatory Visit: Payer: Self-pay | Admitting: Cardiovascular Disease

## 2016-02-05 NOTE — Telephone Encounter (Signed)
New Message  Pt call requesting to speak with RN. Pt states he would like to discuss the procedure he is to have with Dr. Gwenlyn Found on 10/5. Pt states he had a surgery on 8/3 and did not know if the procedures was schedule to soon. Please call back to discuss

## 2016-02-05 NOTE — Telephone Encounter (Signed)
This is signed. Surgery was done on the right side and I'm going to be sticking the left side. 2 months should be enough time for healing.

## 2016-02-05 NOTE — Telephone Encounter (Signed)
Pt had endarterectomy of right fem on August 3rd. Remembers that this was discussed w Dr. Gwenlyn Found who noted that by the time of cath in 3 months he would be healed up and ready for this. Pt concerned bc his scheduled cath is in 2 weeks time and he notes this is only 2 months since the vascular surgery. He wanted to make sure this was enough of a window of time since the surgery, and that the cath wouldn't need to be delayed. Pt aware I will route to MD for review.

## 2016-02-05 NOTE — Telephone Encounter (Signed)
Pt aware of recommendations, OK to proceed as scheduled, he voiced understanding and thanks.

## 2016-02-10 ENCOUNTER — Other Ambulatory Visit: Payer: Self-pay | Admitting: Cardiovascular Disease

## 2016-02-10 DIAGNOSIS — I25700 Atherosclerosis of coronary artery bypass graft(s), unspecified, with unstable angina pectoris: Secondary | ICD-10-CM

## 2016-02-12 DIAGNOSIS — I739 Peripheral vascular disease, unspecified: Secondary | ICD-10-CM | POA: Diagnosis not present

## 2016-02-12 DIAGNOSIS — D689 Coagulation defect, unspecified: Secondary | ICD-10-CM | POA: Diagnosis not present

## 2016-02-12 DIAGNOSIS — Z01818 Encounter for other preprocedural examination: Secondary | ICD-10-CM | POA: Diagnosis not present

## 2016-02-12 LAB — CBC WITH DIFFERENTIAL/PLATELET
Basophils Absolute: 81 cells/uL (ref 0–200)
Basophils Relative: 1 %
Eosinophils Absolute: 162 cells/uL (ref 15–500)
Eosinophils Relative: 2 %
HCT: 43.1 % (ref 38.5–50.0)
Hemoglobin: 13.9 g/dL (ref 13.2–17.1)
Lymphocytes Relative: 25 %
Lymphs Abs: 2025 cells/uL (ref 850–3900)
MCH: 27.8 pg (ref 27.0–33.0)
MCHC: 32.3 g/dL (ref 32.0–36.0)
MCV: 86.2 fL (ref 80.0–100.0)
MPV: 10.6 fL (ref 7.5–12.5)
Monocytes Absolute: 810 cells/uL (ref 200–950)
Monocytes Relative: 10 %
Neutro Abs: 5022 cells/uL (ref 1500–7800)
Neutrophils Relative %: 62 %
Platelets: 239 10*3/uL (ref 140–400)
RBC: 5 MIL/uL (ref 4.20–5.80)
RDW: 13.9 % (ref 11.0–15.0)
WBC: 8.1 10*3/uL (ref 3.8–10.8)

## 2016-02-12 LAB — PROTIME-INR
INR: 1
Prothrombin Time: 10.6 s (ref 9.0–11.5)

## 2016-02-12 LAB — APTT: aPTT: 26 s (ref 22–34)

## 2016-02-13 LAB — BASIC METABOLIC PANEL
BUN: 19 mg/dL (ref 7–25)
CO2: 24 mmol/L (ref 20–31)
Calcium: 8.9 mg/dL (ref 8.6–10.3)
Chloride: 107 mmol/L (ref 98–110)
Creat: 1.05 mg/dL (ref 0.70–1.18)
Glucose, Bld: 104 mg/dL — ABNORMAL HIGH (ref 65–99)
Potassium: 4.7 mmol/L (ref 3.5–5.3)
Sodium: 141 mmol/L (ref 135–146)

## 2016-02-20 ENCOUNTER — Ambulatory Visit (HOSPITAL_COMMUNITY)
Admission: RE | Admit: 2016-02-20 | Discharge: 2016-02-21 | Disposition: A | Discharge status: Home / Self Care | Payer: Medicare Other | Source: Ambulatory Visit | Attending: Cardiovascular Disease | Admitting: Cardiovascular Disease

## 2016-02-20 ENCOUNTER — Encounter (HOSPITAL_COMMUNITY): Payer: Self-pay | Admitting: Cardiovascular Disease

## 2016-02-20 ENCOUNTER — Encounter (HOSPITAL_COMMUNITY)
Admission: RE | Disposition: A | Discharge status: Home / Self Care | Payer: Self-pay | Source: Ambulatory Visit | Attending: Cardiovascular Disease

## 2016-02-20 DIAGNOSIS — Z951 Presence of aortocoronary bypass graft: Secondary | ICD-10-CM | POA: Diagnosis not present

## 2016-02-20 DIAGNOSIS — Z7982 Long term (current) use of aspirin: Secondary | ICD-10-CM | POA: Diagnosis not present

## 2016-02-20 DIAGNOSIS — I2510 Atherosclerotic heart disease of native coronary artery without angina pectoris: Secondary | ICD-10-CM | POA: Diagnosis not present

## 2016-02-20 DIAGNOSIS — Z7902 Long term (current) use of antithrombotics/antiplatelets: Secondary | ICD-10-CM | POA: Diagnosis not present

## 2016-02-20 DIAGNOSIS — Z882 Allergy status to sulfonamides status: Secondary | ICD-10-CM | POA: Insufficient documentation

## 2016-02-20 DIAGNOSIS — I70211 Atherosclerosis of native arteries of extremities with intermittent claudication, right leg: Secondary | ICD-10-CM | POA: Insufficient documentation

## 2016-02-20 DIAGNOSIS — I739 Peripheral vascular disease, unspecified: Secondary | ICD-10-CM | POA: Diagnosis present

## 2016-02-20 DIAGNOSIS — Y812 Prosthetic and other implants, materials and accessory general- and plastic-surgery devices associated with adverse incidents: Secondary | ICD-10-CM | POA: Insufficient documentation

## 2016-02-20 DIAGNOSIS — F17210 Nicotine dependence, cigarettes, uncomplicated: Secondary | ICD-10-CM | POA: Diagnosis not present

## 2016-02-20 DIAGNOSIS — T82856A Stenosis of peripheral vascular stent, initial encounter: Secondary | ICD-10-CM | POA: Diagnosis not present

## 2016-02-20 DIAGNOSIS — E785 Hyperlipidemia, unspecified: Secondary | ICD-10-CM | POA: Diagnosis not present

## 2016-02-20 HISTORY — PX: PERIPHERAL VASCULAR CATHETERIZATION: SHX172C

## 2016-02-20 LAB — POCT ACTIVATED CLOTTING TIME
Activated Clotting Time: 213 seconds
Activated Clotting Time: 224 seconds
Activated Clotting Time: 241 seconds
Activated Clotting Time: 136 seconds

## 2016-02-20 SURGERY — PERIPHERAL VASCULAR ATHERECTOMY
Laterality: Right

## 2016-02-20 MED ORDER — MIDAZOLAM HCL 2 MG/2ML IJ SOLN
INTRAMUSCULAR | Status: DC | PRN
  Administered 2016-02-20: 1 mg via INTRAVENOUS

## 2016-02-20 MED ORDER — CLOPIDOGREL BISULFATE 75 MG PO TABS
75.0000 mg | ORAL_TABLET | Freq: Every day | ORAL | Status: DC
Start: 2016-02-21 — End: 2016-02-21
  Administered 2016-02-21: 75 mg via ORAL
  Filled 2016-02-20: qty 1

## 2016-02-20 MED ORDER — HYDRALAZINE HCL 20 MG/ML IJ SOLN: 10.0000 mg | INTRAMUSCULAR | Status: DC | PRN

## 2016-02-20 MED ORDER — NITROGLYCERIN 1 MG/10 ML FOR IR/CATH LAB
INTRA_ARTERIAL | Status: DC | PRN
  Administered 2016-02-20 (×2): 200 ug via INTRA_ARTERIAL

## 2016-02-20 MED ORDER — ASPIRIN 81 MG PO CHEW: 81.0000 mg | CHEWABLE_TABLET | ORAL | Status: DC

## 2016-02-20 MED ORDER — CHOLESTYRAMINE 4 G PO PACK
1.0000 | PACK | Freq: Two times a day (BID) | ORAL | Status: DC
  Administered 2016-02-21: 09:00:00 1 via ORAL
  Filled 2016-02-20 (×2): qty 1

## 2016-02-20 MED ORDER — SODIUM CHLORIDE 0.9% FLUSH: 3.0000 mL | INTRAVENOUS | Status: DC | PRN

## 2016-02-20 MED ORDER — IBUPROFEN 200 MG PO TABS: 600.0000 mg | ORAL_TABLET | Freq: Three times a day (TID) | ORAL | Status: DC | PRN

## 2016-02-20 MED ORDER — HEPARIN SODIUM (PORCINE) 1000 UNIT/ML IJ SOLN
INTRAMUSCULAR | Status: AC
  Filled 2016-02-20: qty 1

## 2016-02-20 MED ORDER — MIDAZOLAM HCL 2 MG/2ML IJ SOLN
INTRAMUSCULAR | Status: AC
  Filled 2016-02-20: qty 2

## 2016-02-20 MED ORDER — VERAPAMIL HCL 2.5 MG/ML IV SOLN
INTRAVENOUS | Status: AC
  Filled 2016-02-20: qty 2

## 2016-02-20 MED ORDER — HEPARIN SODIUM (PORCINE) 1000 UNIT/ML IJ SOLN
INTRAMUSCULAR | Status: DC | PRN
  Administered 2016-02-20: 3000 [IU] via INTRAVENOUS
  Administered 2016-02-20: 6000 [IU] via INTRAVENOUS
  Administered 2016-02-20: 3000 [IU] via INTRAVENOUS

## 2016-02-20 MED ORDER — MORPHINE SULFATE (PF) 2 MG/ML IV SOLN: 2.0000 mg | INTRAVENOUS | Status: DC | PRN

## 2016-02-20 MED ORDER — PANTOPRAZOLE SODIUM 40 MG PO TBEC
40.0000 mg | DELAYED_RELEASE_TABLET | Freq: Two times a day (BID) | ORAL | Status: DC
  Administered 2016-02-20 – 2016-02-21 (×2): 40 mg via ORAL
  Filled 2016-02-20 (×2): qty 1

## 2016-02-20 MED ORDER — FENTANYL CITRATE (PF) 100 MCG/2ML IJ SOLN
INTRAMUSCULAR | Status: AC
  Filled 2016-02-20: qty 2

## 2016-02-20 MED ORDER — NITROGLYCERIN IN D5W 200-5 MCG/ML-% IV SOLN
INTRAVENOUS | Status: AC
  Filled 2016-02-20: qty 250

## 2016-02-20 MED ORDER — ROSUVASTATIN CALCIUM 20 MG PO TABS
20.0000 mg | ORAL_TABLET | Freq: Every day | ORAL | Status: DC
  Administered 2016-02-21: 20 mg via ORAL
  Filled 2016-02-20: qty 1

## 2016-02-20 MED ORDER — LIDOCAINE HCL (PF) 1 % IJ SOLN
INTRAMUSCULAR | Status: AC
Start: 2016-02-20 — End: 2016-02-20
  Filled 2016-02-20: qty 30

## 2016-02-20 MED ORDER — LEVOTHYROXINE SODIUM 25 MCG PO TABS
25.0000 ug | ORAL_TABLET | Freq: Every day | ORAL | Status: DC
  Administered 2016-02-21: 25 ug via ORAL
  Filled 2016-02-20: qty 1

## 2016-02-20 MED ORDER — ASPIRIN EC 81 MG PO TBEC
81.0000 mg | DELAYED_RELEASE_TABLET | Freq: Every evening | ORAL | Status: DC
  Administered 2016-02-20: 81 mg via ORAL
  Filled 2016-02-20: qty 1

## 2016-02-20 MED ORDER — FENTANYL CITRATE (PF) 100 MCG/2ML IJ SOLN
INTRAMUSCULAR | Status: DC | PRN
  Administered 2016-02-20: 25 ug via INTRAVENOUS

## 2016-02-20 MED ORDER — SODIUM CHLORIDE 0.9 % WEIGHT BASED INFUSION: 1.0000 mL/kg/h | INTRAVENOUS | Status: DC

## 2016-02-20 MED ORDER — NEBIVOLOL HCL 2.5 MG PO TABS
2.5000 mg | ORAL_TABLET | Freq: Every day | ORAL | Status: DC
  Administered 2016-02-21: 2.5 mg via ORAL
  Filled 2016-02-20: qty 1

## 2016-02-20 MED ORDER — ISOSORBIDE MONONITRATE ER 30 MG PO TB24
15.0000 mg | ORAL_TABLET | Freq: Every day | ORAL | Status: DC
Start: 2016-02-21 — End: 2016-02-21
  Administered 2016-02-21: 10:00:00 15 mg via ORAL
  Filled 2016-02-20: qty 1

## 2016-02-20 MED ORDER — SODIUM CHLORIDE 0.9 % WEIGHT BASED INFUSION
3.0000 mL/kg/h | INTRAVENOUS | Status: DC
  Administered 2016-02-20: 3 mL/kg/h via INTRAVENOUS

## 2016-02-20 MED ORDER — HEPARIN (PORCINE) IN NACL 2-0.9 UNIT/ML-% IJ SOLN
INTRAMUSCULAR | Status: AC
  Filled 2016-02-20: qty 1000

## 2016-02-20 MED ORDER — ASPIRIN EC 81 MG PO TBEC: 81.0000 mg | DELAYED_RELEASE_TABLET | Freq: Every day | ORAL | Status: DC

## 2016-02-20 MED ORDER — NITROGLYCERIN 0.4 MG SL SUBL: 0.4000 mg | SUBLINGUAL_TABLET | SUBLINGUAL | Status: DC | PRN

## 2016-02-20 MED ORDER — IOPAMIDOL (ISOVUE-370) INJECTION 76%
INTRAVENOUS | Status: DC | PRN
  Administered 2016-02-20: 120 mL via INTRA_ARTERIAL

## 2016-02-20 MED ORDER — HEPARIN (PORCINE) IN NACL 2-0.9 UNIT/ML-% IJ SOLN
INTRAMUSCULAR | Status: DC | PRN
  Administered 2016-02-20: 1000 mL

## 2016-02-20 MED ORDER — SODIUM CHLORIDE 0.9 % IV SOLN: INTRAVENOUS | Status: AC

## 2016-02-20 MED ORDER — LOSARTAN POTASSIUM 50 MG PO TABS
100.0000 mg | ORAL_TABLET | Freq: Every day | ORAL | Status: DC
  Administered 2016-02-21: 10:00:00 100 mg via ORAL
  Filled 2016-02-20: qty 2

## 2016-02-20 MED ORDER — ACETAMINOPHEN 325 MG PO TABS: 650.0000 mg | ORAL_TABLET | ORAL | Status: DC | PRN

## 2016-02-20 MED ORDER — ONDANSETRON HCL 4 MG/2ML IJ SOLN: 4.0000 mg | Freq: Four times a day (QID) | INTRAMUSCULAR | Status: DC | PRN

## 2016-02-20 MED ORDER — ZOLPIDEM TARTRATE 5 MG PO TABS: 5.0000 mg | ORAL_TABLET | Freq: Every evening | ORAL | Status: DC | PRN

## 2016-02-20 MED ORDER — CLOPIDOGREL BISULFATE 75 MG PO TABS: 75.0000 mg | ORAL_TABLET | Freq: Every day | ORAL | Status: DC

## 2016-02-20 MED ORDER — LIDOCAINE HCL (PF) 1 % IJ SOLN
INTRAMUSCULAR | Status: DC | PRN
  Administered 2016-02-20: 23 mL

## 2016-02-20 SURGICAL SUPPLY — 25 items
BALLN ANGIOSCULPT OTW 5X40 (BALLOONS) ×3
BALLN IN.PACT DCB 6X120 (BALLOONS) ×3
BALLOON ANGIOSCULPT OTW 5X40 (BALLOONS) ×2 IMPLANT
CATH ANGIO 5F PIGTAIL 65CM (CATHETERS) IMPLANT
CATH CROSS OVER TEMPO 5F (CATHETERS) ×3 IMPLANT
CATH CXI SUPP ST 2.6FR 150CM (CATHETERS) ×3 IMPLANT
DCB IN.PACT 6X120 (BALLOONS) ×2 IMPLANT
DEVICE EMBOSHIELD NAV6 4.0-7.0 (WIRE) ×3 IMPLANT
DIAMONDBACK SOLID OAS 1.5MM (CATHETERS) ×3
GUIDEWIRE ANGLED .035X150CM (WIRE) ×3 IMPLANT
KIT ENCORE 26 ADVANTAGE (KITS) ×3 IMPLANT
KIT PV (KITS) ×3 IMPLANT
LUBRICANT VIPERSLIDE CORONARY (MISCELLANEOUS) ×3 IMPLANT
SHEATH HIGHFLEX ANSEL 7FR 55CM (SHEATH) ×3 IMPLANT
SHEATH PINNACLE 5F 10CM (SHEATH) ×3 IMPLANT
SHEATH PINNACLE 7F 10CM (SHEATH) ×3 IMPLANT
SYRINGE MEDRAD AVANTA MACH 7 (SYRINGE) ×3 IMPLANT
SYSTEM DIMNDBCK SLD OAS 1.5MM (CATHETERS) ×2 IMPLANT
TAPE RADIOPAQUE TURBO (MISCELLANEOUS) ×3 IMPLANT
TRANSDUCER W/STOPCOCK (MISCELLANEOUS) ×3 IMPLANT
TRAY PV CATH (CUSTOM PROCEDURE TRAY) ×3 IMPLANT
TUBING HIGH PRESSURE 120CM (CONNECTOR) ×3 IMPLANT
WIRE HITORQ VERSACORE ST 145CM (WIRE) ×3 IMPLANT
WIRE SPARTACORE .014X300CM (WIRE) ×3 IMPLANT
WIRE VIPER ADVANCE .017X335CM (WIRE) ×3 IMPLANT

## 2016-02-20 NOTE — Progress Notes (Signed)
Site area: left groin  Site Prior to Removal:  Level 0  Pressure Applied For 20 MINUTES    Minutes Beginning at 1245  Manual:   Yes.    Patient Status During Pull:  stable  Post Pull Groin Site:  Level 0  Post Pull Instructions Given:  Yes.    Post Pull Pulses Present:  Yes.    Dressing Applied:  Yes.    Comments:  Rechecked site at 1330 with no change in assessment

## 2016-02-20 NOTE — Care Management Note (Signed)
Case Management Note  Patient Details  Name: Brian Brock MRN: [id number] Date of Birth: 04-24-1946  Subjective/Objective:   S/p peripheral intervention, already on plavix and asa, NCM will cont to follow for dc needs.                 Action/Plan:   Expected Discharge Date:                  Expected Discharge Plan:  Home/Self Care  In-House Referral:     Discharge planning Services  CM Consult  Post Acute Care Choice:    Choice offered to:     DME Arranged:    DME Agency:     HH Arranged:    HH Agency:     Status of Service:  In process, will continue to follow  If discussed at Long Length of Stay Meetings, dates discussed:    Additional Comments:  Zenon Mayo, RN 02/20/2016, 5:32 PM

## 2016-02-20 NOTE — Interval H&P Note (Signed)
History and Physical Interval Note:  02/20/2016 7:40 AM  Brian Brock  has presented today for surgery, with the diagnosis of claudication  The various methods of treatment have been discussed with the patient and family. After consideration of risks, benefits and other options for treatment, the patient has consented to  Procedure(s): Lower Extremity Angiography (N/A) as a surgical intervention .  The patient's history has been reviewed, patient examined, no change in status, stable for surgery.  I have reviewed the patient's chart and labs.  Questions were answered to the patient's satisfaction.     [name]

## 2016-02-20 NOTE — H&P (View-Only) (Signed)
02/04/2016 [name]   12-28-1945  [id number]  Primary Physician [name], MD Primary Cardiologist: Lorretta Harp MD Renae Gloss  HPI:  Mr. Valera is a 70 year old gentleman with a history of CAD status post bypass grafting in the past and subsequent LAD intervention by Dr. Claiborne Billings. He has had bilateral SFA stents placed as well.[name] He's had right greater than left leg; occasional with recent Doppler studies suggest a high-frequency signal in his mid right SFA. He underwent peripheral angiography and intervention by myself 09/26/15 which time I stented his left external iliac artery. I did demonstrate 5060% segmental stenosis of his mid left SFA with two-vessel runoff. He had a 90% calcified exophytic plaque in his right common femoral artery, 90% "in-stent restenosis within his replaced right SFA stent for any percent focal stenosis just proximal to the stent, 95% calcified stenosis distal to the stent with two-vessel runoff. He essentially underwent right common femoral endarterectomy and patch angioplasty by Dr. Trula Slade A/4/17 which moderately improved his claudication..   Current Outpatient Prescriptions  Medication Sig Dispense Refill  . aspirin EC 81 MG tablet Take 81 mg by mouth daily.    . cetirizine (ZYRTEC) 10 MG tablet Take 10 mg by mouth daily.    . cholestyramine (QUESTRAN) 4 G packet Take 1 packet by mouth 2 (two) times daily with a meal. 60 each 11  . clopidogrel (PLAVIX) 75 MG tablet Take 1 tablet (75 mg total) by mouth daily. 90 tablet 3  . isosorbide mononitrate (IMDUR) 30 MG 24 hr tablet Take 0.5 tablets (15 mg total) by mouth daily. 15 tablet 10  . Lactobacillus (FLORAJEN ACIDOPHILUS PO) Take by mouth daily.    [name] levothyroxine (SYNTHROID, LEVOTHROID) 25 MCG tablet Take 25 mcg by mouth daily before breakfast.     . losartan (COZAAR) 100 MG tablet TAKE ONE TABLET BY MOUTH EVERY DAY. 30 tablet 5  . nebivolol (BYSTOLIC) 5 MG tablet Take 0.5  tablets (2.5 mg total) by mouth daily. 30 tablet 6  . NITROSTAT 0.4 MG SL tablet Place 1 tablet (0.4 mg total) under the tongue every 5 (five) minutes as needed for chest pain. 25 tablet 3  . pantoprazole (PROTONIX) 40 MG tablet Take 1 tablet by mouth 2 (two) times daily.     . rosuvastatin (CRESTOR) 20 MG tablet Take 1 tablet (20 mg total) by mouth daily. 90 tablet 3   No current facility-administered medications for this visit.     Allergies  Allergen Reactions  . Metoprolol Other (See Comments)    Fatigue and malaise  . Fish Oil   . Levofloxacin     Unknown reaction  . Meloxicam     Unknown reaction  . Metaxalone     Unknown reaction  . Niaspan [Niacin]   . Sulfonamide Derivatives     unknown reaction - bad rash  . Tricor [Fenofibrate]     Social History   Social History  . Marital status: Married    Spouse name: N/A  . Number of children: N/A  . Years of education: N/A   Occupational History  . Not on file.   Social History Main Topics  . Smoking status: Current Some Day Smoker    Packs/day: 0.50    Years: 56.00    Types: Cigarettes    Last attempt to quit: 07/24/2014  . Smokeless tobacco: Current User    Types: Chew     Comment: 09/26/2015 "I chew once in awhile"  .  Alcohol use Yes     Comment: 09/26/2015 "varies; might have a few drinks q 2 months"  . Drug use: No  . Sexual activity: Not Currently   Other Topics Concern  . Not on file   Social History Narrative  . No narrative on file     Review of Systems: General: negative for chills, fever, night sweats or weight changes.  Cardiovascular: negative for chest pain, dyspnea on exertion, edema, orthopnea, palpitations, paroxysmal nocturnal dyspnea or shortness of breath Dermatological: negative for rash Respiratory: negative for cough or wheezing Urologic: negative for hematuria Abdominal: negative for nausea, vomiting, diarrhea, bright red blood per rectum, melena, or hematemesis Neurologic: negative  for visual changes, syncope, or dizziness All other systems reviewed and are otherwise negative except as noted above.    Blood pressure 128/67, pulse (!) 57, height 5\' 9"  (1.753 m), weight 187 lb 9.6 oz (85.1 kg).  General appearance: alert and no distress Neck: no adenopathy, no carotid bruit, no JVD, supple, symmetrical, trachea midline and thyroid not enlarged, symmetric, no tenderness/mass/nodules Lungs: clear to auscultation bilaterally Heart: regular rate and rhythm, S1, S2 normal, no murmur, click, rub or gallop Extremities: His right common femoral endarterectomy incision is well-healed without bruit. He has 2+ pulses there.  EKG not performed today  ASSESSMENT AND PLAN:   PVD- Lt SFA stent '04, Rt SFA stent '08 Mr. Graef has a history of PAD. He is status post left external iliac artery stenting 09/26/15. He had moderate segmental disease in his left SFA with two-vessel runoff. He also had a 90% calcified exophytic plaque in his right common femoral artery, 90% "in-stent restenosis were copiously placed mid right SFA stent and 80% stenosis just proximal to stent, 95% calcified stenosis distal to the stent and three-vessel runoff. He essentially underwent right common femoral artery endarterectomy and patch angioplasty by Dr. Trula Slade 12/20/15 which moderately improved claudication on the right side. He still has residual disease in his mid right SFA which he desires to have percutaneously revascularized. I will schedule this sometime in October.      Lorretta Harp MD FACP,FACC,FAHA, [facility] 02/04/2016 9:58 AM

## 2016-02-21 ENCOUNTER — Other Ambulatory Visit: Payer: Self-pay | Admitting: Cardiology

## 2016-02-21 DIAGNOSIS — I2510 Atherosclerotic heart disease of native coronary artery without angina pectoris: Secondary | ICD-10-CM | POA: Diagnosis not present

## 2016-02-21 DIAGNOSIS — Z7902 Long term (current) use of antithrombotics/antiplatelets: Secondary | ICD-10-CM | POA: Diagnosis not present

## 2016-02-21 DIAGNOSIS — T82856A Stenosis of peripheral vascular stent, initial encounter: Secondary | ICD-10-CM | POA: Diagnosis not present

## 2016-02-21 DIAGNOSIS — I739 Peripheral vascular disease, unspecified: Secondary | ICD-10-CM

## 2016-02-21 DIAGNOSIS — Z7982 Long term (current) use of aspirin: Secondary | ICD-10-CM | POA: Diagnosis not present

## 2016-02-21 DIAGNOSIS — Z951 Presence of aortocoronary bypass graft: Secondary | ICD-10-CM | POA: Diagnosis not present

## 2016-02-21 DIAGNOSIS — I70211 Atherosclerosis of native arteries of extremities with intermittent claudication, right leg: Secondary | ICD-10-CM | POA: Diagnosis not present

## 2016-02-21 LAB — CBC
HCT: 36.6 % — ABNORMAL LOW (ref 39.0–52.0)
Hemoglobin: 11.8 g/dL — ABNORMAL LOW (ref 13.0–17.0)
MCH: 27.8 pg (ref 26.0–34.0)
MCHC: 32.2 g/dL (ref 30.0–36.0)
MCV: 86.3 fL (ref 78.0–100.0)
Platelets: 188 10*3/uL (ref 150–400)
RBC: 4.24 MIL/uL (ref 4.22–5.81)
RDW: 13.5 % (ref 11.5–15.5)
WBC: 8.9 10*3/uL (ref 4.0–10.5)

## 2016-02-21 LAB — BASIC METABOLIC PANEL
Anion gap: 6 (ref 5–15)
BUN: 14 mg/dL (ref 6–20)
CO2: 25 mmol/L (ref 22–32)
Calcium: 8.3 mg/dL — ABNORMAL LOW (ref 8.9–10.3)
Chloride: 109 mmol/L (ref 101–111)
Creatinine, Ser: 1.06 mg/dL (ref 0.61–1.24)
GFR calc Af Amer: >60 mL/min (ref >60)
GFR calc non Af Amer: >60 mL/min (ref >60)
Glucose, Bld: 86 mg/dL (ref 65–99)
Potassium: 3.9 mmol/L (ref 3.5–5.1)
Sodium: 140 mmol/L (ref 135–145)

## 2016-02-21 NOTE — Progress Notes (Signed)
Patient Name: Brian Brock Date of Encounter: 02/21/2016  Primary Cardiologist: Dr. Serena Croissant Problem List     Active Problems:   PVD- Lt SFA stent '04, Rt SFA stent '08   Claudication Brian Brock)     Subjective   Feels well, denies pain at groin site. Denies pain in right leg.   Inpatient Medications    Scheduled Meds: . aspirin EC  81 mg Oral QPM  . cholestyramine  1 packet Oral BID WC  . clopidogrel  75 mg Oral Q breakfast  . isosorbide mononitrate  15 mg Oral Daily  . levothyroxine  25 mcg Oral QAC breakfast  . losartan  100 mg Oral Daily  . nebivolol  2.5 mg Oral Daily  . pantoprazole  40 mg Oral BID  . rosuvastatin  20 mg Oral Daily   Continuous Infusions:  PRN Meds:.acetaminophen, hydrALAZINE, ibuprofen, morphine injection, nitroGLYCERIN, ondansetron (ZOFRAN) IV, zolpidem   Vital Signs    Vitals:   02/20/16 1942 02/20/16 1946 02/20/16 2345 02/21/16 0400  BP: (!) 107/43 (!) 116/56  (!) 124/52  Pulse: (!) 46  (!) 59 (!) 53  Resp:      Temp: 98.3 F (36.8 C)  98.2 F (36.8 C) 97.6 F (36.4 C)  TempSrc: Oral  Oral Oral  SpO2: 97%  94% 98%  Weight:      Height:        Intake/Output Summary (Last 24 hours) at 02/21/16 [id number] Last data filed at 02/21/16 0000  Gross per 24 hour  Intake             1740 ml  Output              875 ml  Net              865 ml   Filed Weights   02/20/16 0629  Weight: 183 lb (83 kg)    Physical Exam   GEN: Well nourished, well developed, in no acute distress.  HEENT: Grossly normal.  Neck: Supple, no JVD, carotid bruits, or masses. Cardiac: RRR, no murmurs, rubs, or gallops. No clubbing, cyanosis, edema.  Radials/DP/PT 2+ and equal bilaterally.  Respiratory:  Respirations regular and unlabored, clear to auscultation bilaterally. GI: Soft, nontender, nondistended, BS + x 4. MS: no deformity or atrophy. Skin: warm and dry, no rash. Neuro:  Strength and sensation are intact. Psych: AAOx3.  Normal affect.  Labs      CBC  Recent Labs  02/21/16 0348  WBC 8.9  HGB 11.8*  HCT 36.6*  MCV 86.3  PLT 0000000   Basic Metabolic Panel  Recent Labs  02/21/16 0348  NA 140  K 3.9  CL 109  CO2 25  GLUCOSE 86  BUN 14  CREATININE 1.06  CALCIUM 8.3*     Telemetry    Sinus brady- Personally Reviewed  ECG    Last in May 2017, sinus brady with PAC's- Personally Reviewed  Radiology    No results found.   Cardiac Studies   Peripheral Vascular Balloon Angioplasty  Peripheral Vascular Atherectomy 02/20/16  Final Impression: Successful diamondback orbital rotational atherectomy of a highly calcified 99% eccentric mid right SFA stenosis using NAV 6 distal protection followed by Angiosculpt  cutting balloon atherectomy and drug-eluting balloon angioplasty for "in-stent restenosis. This was performed for lifestyle limiting claudication. The patient had an excellent angiographic result. The sheath will be removed and pressure held once the ACT falls below 170. He'll be hydrated overnight. He is  already on dual antiplatelet therapy. He'll be discharged on the morning. We will get follow-up lower extremity arterial Doppler studies in our [place] office next week and I will see him back in 2-3 weeks thereafter.    Patient Profile  Brian Brock is a 70 year old gentleman with a history of CAD status post bypass grafting in the past and subsequent LAD intervention by Dr. Claiborne Billings. He presented for a staged right SFA diamondback orbital rotational atherectomy, drug eluting balloon angioplasty of "in-stent restenosis within the mid right SFA nitinol stent as well as high-grade calcified fairly focal mid to distal right SFA stenosis beyond the stent.    Assessment & Plan    1. Peripheral arterial disease s/p atherectomy: PV report above. Had successful rotational atherectomy and drug balloon angioplasty of his mid right SFA stent as well as high-grade calcified fairly focal mid to distal right SFA stenosis beyond  the stent.   He is on DAPT with ASA and Plavix. Will continue this. Left groin site stable.   We will get follow-up lower extremity arterial Doppler studies in our [place] office next week and Dr. Gwenlyn Found will see him back in 2-3 weeks thereafter.  2. CAD s/p CABG: Denies angina. Continue DAPT as described above.   3. HLD: Continue Crestor. Needs updated lipids as outpatient.    Signed, Arbutus Leas, NP  02/21/2016, 6:32 AM   Patient seen, examined. Available data reviewed. Agree with findings, assessment, and plan as outlined by Jettie Booze, NP. The left groin site is clear. Posterior tibial pulses are 1+ bilaterally. Heart is regular rate and rhythm, lungs are clear. The patient underwent successful atherectomy and angioplasty of the right SFA yesterday. He will follow-up with Dr. Gwenlyn Found as planned.  Sherren Mocha, M.D. 02/21/2016 8:27 AM

## 2016-02-21 NOTE — Progress Notes (Signed)
Smoking Cessation Counseling- Discussed smoking cessation in depth with pt who states he is not ready to quit at this time. States Chantix and patch didn't work for him. Wife quit 16 yrs ago. Discussed stress management techniques and tips for success.  Pt voiced understanding.  Denies complaints at this time.

## 2016-02-21 NOTE — Discharge Summary (Signed)
Discharge Summary    Patient ID: Brian Brock,  MRN: [id number], DOB/AGE: February 05, 1946 70 y.o.  Admit date: 02/20/2016 Discharge date: 02/21/2016  Primary Care Provider: ROBBINS,ROBERT A Primary Cardiologist: Dr. Gwenlyn Found   Discharge Diagnoses    Active Problems:   PVD- Lt SFA stent '04, Rt SFA stent '08   Claudication [facility])   Allergies Allergies  Allergen Reactions  . Metoprolol Other (See Comments)    Fatigue and malaise  . Levofloxacin     Unknown reaction  . Meloxicam Other (See Comments)    Achy, fatigue  . Metaxalone     Unknown reaction  . Niaspan [Niacin] Other (See Comments)    Unknown reaction  . Tricor [Fenofibrate] Other (See Comments)    Tired/weak  . Fish Oil Rash  . Sulfonamide Derivatives Rash    unknown reaction - bad rash    Diagnostic Studies/Procedures    Peripheral Vascular Balloon Angioplasty  Peripheral Vascular Atherectomy 02/20/16   Pre Procedure Diagnosis: Peripheral arterial disease  Post Procedure Diagnosis: Peripheral arterial disease  Operators: Dr. [name]  Procedures Performed:            1. Contralateral access placement of sheath from left common femoral artery to right external iliac artery (second order catheter       placement)            2. NAV 6 distal protection device placed in the right popliteal artery            3. Diamondback orbital rotational atherectomy right SFA            4. Angiosculpt  cutting balloon atherectomy right SFA "in-stent restenosis" followed by drug eluting balloon angioplasty right SFA            5. Completion angiography using bolus chase, digital subtraction, step table technique.  PROCEDURE DESCRIPTION:   The patient was brought to the second floor [facility] [place] [facility] cath lab in the the postabsorptive state. He was premedicated with Valium 5 mg by mouth, IV Versed and fentanyl. His left groin was prepped and shaved in usual sterile fashion. Xylocaine 1% was used for local  anesthesia. A 7 French sheath was inserted into the left common femoral  artery using standard Seldinger technique. Contralateral access was obtained with a 5 [place] crossover catheter, 035 Glidewire and a Versicore  wire along with a 7 [place] multipurpose 55 cm Ansel sheath.  The patient received 12,000 units of heparin with a ACT of 241. Total contrast administered the patient was 120 mL. I took an oblique angiogram of the right common femoral artery endarterectomy site demonstrating it to be patulous and widely patent. Omnipaque dye was used for the entirety of the case. Retrograde aortic pressure was monitored during the case. I was able to cross the 99% focal calcified eccentric mid to distal right SFA stenosis with an 014 [place] core wire in a 018 straight CXI endhole  Catheter. I was unable to pass a NAV 6 distal protection device over the wire and across the tight lesion and therefore I atherectomy using a 1.5 solid bur at 60,000 rpm's. Following this I was able to pass the distal protection device which I placed over an 014/017 Viper Wire in the P2 segment of the right popliteal artery. I then performed multiple passes using the 1.5 bur and escalating RPM strategy focusing on the highly calcified segment. Ultimately I used 120,000 rpm's. After removal of the bur angiography demonstrated calcification to  be essentially resolved. I then used a 5 mm x 4 cm angiosculpt  balloon to perform cutting balloon atherectomy of the mid right SFA nitinol stent for "in-stent restenosis. Following this I performed drug eluting balloon angioplasty of the entire treated segment using a 6 mm x 120 mm long abnormal Medtronic drug-eluting balloon at nominal pressures for 2-1/2 minutes resulting in reduction of a 80-90% "in-stent restenosis of less than 20% and a 99% focal calcified eccentric plaque to 0% residual. Completion angiography demonstrated a widely patient patent intact site with three-vessel runoff. I then  captured the NAV 6 distal protection device which was filled with calcium and performed completion angiography using bolus chase, digital subtraction, step table technique. I then withdrew the sheath across the bifurcation and exchanged over an 0 35 wire for a short 7 [place] sheath which was then secured in place. The patient left the lab in stable condition.   Final Impression: Successful diamondback orbital rotational atherectomy of a highly calcified 99% eccentric mid right SFA stenosis using NAV 6 distal protection followed by Angiosculpt  cutting balloon atherectomy and drug-eluting balloon angioplasty for "in-stent restenosis. This was performed for lifestyle limiting claudication. The patient had an excellent angiographic result. The sheath will be removed and pressure held once the ACT falls below 170. He'll be hydrated overnight. He is already on dual antiplatelet therapy. He'll be discharged on the morning. We will get follow-up lower extremity arterial Doppler studies in our [place] office next week and I will see him back in 2-3 weeks thereafter. _____________   History of Present Illness   [name] is a 70 year old gentleman with a history of CAD status post bypass grafting in the past and subsequent LAD intervention by Dr. Claiborne Billings.   He has had bilateral SFA stents placed as well. He's had right greater than left leg; occasional with recent Doppler studies suggest a high-frequency signal in his mid right SFA. He underwent peripheral angiography and intervention by Dr. Gwenlyn Found on 09/26/15, had successful stent placement to his left external iliac artery .  It did demonstrate 50-60% segmental stenosis of his mid left SFA with two-vessel runoff. He had a 90% calcified exophytic plaque in his right common femoral artery, 90% "in-stent restenosis within his replaced right SFA stent for any percent focal stenosis just proximal to the stent, 95% calcified stenosis distal to the stent with  two-vessel runoff. He essentially underwent right common femoral endarterectomy and patch angioplasty by Dr. Trula Slade 12/20/15 which moderately improved his claudication.   He presented on 02/20/16 for staged right SFA diamondback orbital rotational atherectomy, drug eluting balloon angioplasty of "in-stent restenosis within the mid right SFA nitinol stent as well as high-grade calcified fairly focal mid to distal right SFA stenosis beyond the stent.    Hospital Course  Full PV report above. He tolerate the procedure well, left groin site was stable without hematoma.   He will continue on his ASA and Plavix. Plan for dopplers next week and then he will follow up with Dr. Gwenlyn Found 2 weeks thereafter.   He was seen today by Dr. Burt Knack and deemed suitable for discharge.  _____________  Discharge Vitals Blood pressure (!) 124/52, pulse (!) 53, temperature 97.6 F (36.4 C), temperature source Oral, resp. rate 18, height 5\' 9"  (1.753 m), weight 183 lb (83 kg), SpO2 98 %.  Filed Weights   02/20/16 0629  Weight: 183 lb (83 kg)    Labs & Radiologic Studies     CBC  Recent  Labs  02/21/16 0348  WBC 8.9  HGB 11.8*  HCT 36.6*  MCV 86.3  PLT 0000000   Basic Metabolic Panel  Recent Labs  02/21/16 0348  NA 140  K 3.9  CL 109  CO2 25  GLUCOSE 86  BUN 14  CREATININE 1.06  CALCIUM 8.3*     Disposition   Pt is being discharged home today in good condition.  Follow-up Plans & Appointments    Follow-up Information    [name], MD Follow up on 03/10/2016.   Specialties:  Cardiology, Radiology Why:  at 11:45 for follow up with Dr. Gwenlyn Found.  Contact information: [address] [place] [address] [phone]        [facility] Follow up on 02/27/2016.   Specialty:  Cardiology Why:  for dopplers at Dr. Kennon Holter office at 3:30pm Contact information: [address] [place] [phone]          Discharge Instructions    Diet - low sodium heart healthy    Complete by:  As directed    Discharge instructions    Complete by:  As directed    Groin Site Care Refer to this sheet in the next few weeks. These instructions provide you with information on caring for yourself after your procedure. Your caregiver may also give you more specific instructions. Your treatment has been planned according to current medical practices, but problems sometimes occur. Call your caregiver if you have any problems or questions after your procedure. HOME CARE INSTRUCTIONS You may shower 24 hours after the procedure. Remove the bandage (dressing) and gently wash the site with plain soap and water. Gently pat the site dry.  Do not apply powder or lotion to the site.  Do not sit in a bathtub, swimming pool, or whirlpool for 5 to 7 days.  No bending, squatting, or lifting anything over 10 pounds (4.5 kg) as directed by your caregiver.  Inspect the site at least twice daily.  Do not drive home if you are discharged the same day of the procedure. Have someone else drive you.  You may drive 24 hours after the procedure unless otherwise instructed by your caregiver.  What to expect: Any bruising will usually fade within 1 to 2 weeks.  Blood that collects in the tissue (hematoma) may be painful to the touch. It should usually decrease in size and tenderness within 1 to 2 weeks.  SEEK IMMEDIATE MEDICAL CARE IF: You have unusual pain at the groin site or down the affected leg.  You have redness, warmth, swelling, or pain at the groin site.  You have drainage (other than a small amount of blood on the dressing).  You have chills.  You have a fever or persistent symptoms for more than 72 hours.  You have a fever and your symptoms suddenly get worse.  Your leg becomes pale, cool, tingly, or numb.  You have heavy bleeding from the site. Hold pressure on the site. .   Increase activity slowly    Complete by:  As  directed       Discharge Medications   Discharge Medication List as of 02/21/2016 10:10 AM    CONTINUE these medications which have NOT CHANGED   Details  aspirin EC 81 MG tablet Take 81 mg by mouth every evening. , Historical Med    cetirizine (ZYRTEC) 10 MG tablet Take 10 mg by mouth daily., Until Discontinued, Historical Med    cholestyramine (  QUESTRAN) 4 G packet Take 1 packet by mouth 2 (two) times daily with a meal., Starting 10/20/2012, Until Discontinued, Phone In    clopidogrel (PLAVIX) 75 MG tablet TAKE ONE TABLET BY MOUTH EVERY DAY, Normal    isosorbide mononitrate (IMDUR) 30 MG 24 hr tablet TAKE 1/2 TABLET BY MOUTH EVERY DAY, Normal    Lactobacillus (FLORAJEN ACIDOPHILUS PO) Take by mouth daily., Until Discontinued, Historical Med    levothyroxine (SYNTHROID, LEVOTHROID) 25 MCG tablet Take 25 mcg by mouth daily before breakfast. , Starting 07/31/2014, Until Discontinued, Historical Med    losartan (COZAAR) 100 MG tablet TAKE ONE TABLET BY MOUTH EVERY DAY., Normal    nebivolol (BYSTOLIC) 5 MG tablet Take 0.5 tablets (2.5 mg total) by mouth daily., Starting Fri 09/27/2015, Normal    NITROSTAT 0.4 MG SL tablet Place 1 tablet (0.4 mg total) under the tongue every 5 (five) minutes as needed for chest pain., Starting 09/11/2015, Until Discontinued, Normal    pantoprazole (PROTONIX) 40 MG tablet Take 1 tablet by mouth 2 (two) times daily. , Starting 02/08/2014, Until Discontinued, Historical Med    rosuvastatin (CRESTOR) 20 MG tablet Take 1 tablet (20 mg total) by mouth daily., Starting Wed 11/06/2015, Normal    zolpidem (AMBIEN) 10 MG tablet Take 5 mg by mouth at bedtime as needed for sleep., Historical Med      STOP taking these medications     ibuprofen (ADVIL,MOTRIN) 600 MG tablet            Outstanding Labs/Studies   Doppler studies as scheduled above.   Duration of Discharge Encounter   Greater than 30 minutes including physician time.  Signed, Arbutus Leas  NP 02/21/2016, 2:27 PM

## 2016-02-24 MED FILL — Verapamil HCl IV Soln 2.5 MG/ML: INTRAVENOUS | Qty: 2 | Status: AC

## 2016-02-24 MED FILL — Nitroglycerin IV Soln 200 MCG/ML in D5W: INTRAVENOUS | Qty: 250 | Status: AC

## 2016-02-27 ENCOUNTER — Ambulatory Visit (HOSPITAL_COMMUNITY)
Admission: RE | Admit: 2016-02-27 | Discharge: 2016-02-27 | Disposition: A | Discharge status: Home / Self Care | Payer: Medicare Other | Source: Ambulatory Visit | Attending: Cardiovascular Disease | Admitting: Cardiovascular Disease

## 2016-02-27 DIAGNOSIS — I739 Peripheral vascular disease, unspecified: Secondary | ICD-10-CM | POA: Insufficient documentation

## 2016-03-09 ENCOUNTER — Encounter: Payer: Self-pay | Admitting: Internal Medicine

## 2016-03-09 ENCOUNTER — Ambulatory Visit (INDEPENDENT_AMBULATORY_CARE_PROVIDER_SITE_OTHER): Payer: Medicare Other | Admitting: Internal Medicine

## 2016-03-09 DIAGNOSIS — I70211 Atherosclerosis of native arteries of extremities with intermittent claudication, right leg: Secondary | ICD-10-CM

## 2016-03-09 DIAGNOSIS — Z23 Encounter for immunization: Secondary | ICD-10-CM

## 2016-03-09 DIAGNOSIS — Z720 Tobacco use: Secondary | ICD-10-CM

## 2016-03-09 DIAGNOSIS — J449 Chronic obstructive pulmonary disease, unspecified: Secondary | ICD-10-CM | POA: Diagnosis not present

## 2016-03-09 NOTE — Assessment & Plan Note (Signed)
Mild seasonal exacerbation with rhinorrhea and cough. I don't think he has an infection. We discussed trial of Incruse. I also need to call in the names of what may be too similar rescue inhalers at home, used infrequently. Flu vaccine discussed.

## 2016-03-09 NOTE — Assessment & Plan Note (Signed)
He has significant medical reasons to stop smoking completely and this was emphasized with him today.

## 2016-03-09 NOTE — Patient Instructions (Signed)
Flu vax  Sample Incruse     Inhale 1 puff, once daily    See if it helps the sense of chest congestion and cough  Please call us with the names of the two inhalers you have at home

## 2016-03-09 NOTE — Progress Notes (Signed)
HPI  M former smoker (59 pk yrs) followed for  for asthma/ bronchitis complicated by CAD, GERD   03/07/15- 68 yoM smoker (49 pk yrs) followed for asthma/ bronchitis complicated by CAD/ CABG, GERD Follows for: pt states that his breathing is doing well. He is currently on doxy for chest congestion x3 days. Pt c/o wet cough with yellow mucus and some wheezing. Pt did use albuterol HFA and with relief.  He tells me he is doing well. Currently on doxycycline for acute bronchitis as noted. Murtis Sink but it makes him hoarse. CXR 07/18/14 IMPRESSION: FINDINGS: Stable appearance of biapical pleural-parenchymal scarring. The lungs are clear without focal infiltrate, edema, pneumothorax or pleural effusion. Interstitial markings are diffusely coarsened with chronic features. The cardiopericardial silhouette is within normal limits for size. Patient is status post CABG. Imaged bony structures of the thorax are intact. Stable. No acute findings. Electronically Signed  By: Misty Stanley M.D.  On: 07/18/2014 11:49  03/09/2016-70 year old male smoker (55 pack years) followed for asthma/bronchitis, complicated by CAD/CABG, PAFib, GERD FOLLOWS FOR: Pt slight cough-? from sinus. At times can get mucus up with cough. Blames increased postnasal drip with current cold season for increase in dry cough, sometimes scant clear phlegm. Denies shortness of breath or wheeze, discolored sputum or breathing interfering with sleep. Sometimes feels congested if he lies left side down. 4 or 5 cigarettes daily. Has 2 inhalers, sounded like Pro air and Ventolin, used very rarely. These are not on his medication list. Recent stent and balloon angioplasty for leg artery PAD.  CXR 09/25/2015 IMPRESSION: No evidence of acute cardiopulmonary disease. Electronically Signed   By: Julian Hy M.D.   On: 09/25/2015 11:43  ROS-see HPI Constitutional:   No-   weight loss, night sweats, fevers, chills, fatigue,  lassitude. HEENT:   No-  headaches, difficulty swallowing, tooth/dental problems, sore throat,       No-  sneezing, itching, ear ache, nasal congestion, post nasal drip,  CV:  No-   chest pain, orthopnea, PND, swelling in lower extremities, anasarca, dizziness, palpitations Resp: No-shortness of breath with exertion or at rest.              No-   productive cough,  + non-productive cough,  No- coughing up of blood.              No-   change in color of mucus.  No- wheezing.   Skin: No-   rash or lesions. GI:  No-   heartburn, indigestion, abdominal pain, nausea, vomiting,  GU:  MS:  No-   joint pain or swelling.  . Neuro-     nothing unusual Psych:  No- change in mood or affect. No depression or anxiety.  No memory loss.  OBJ- Physical Exam General- Alert, Oriented, Affect-appropriate, Distress- none acute Skin- rash-none, lesions- none, excoriation- none Lymphadenopathy- none Head- atraumatic            Eyes- Gross vision intact, PERRLA, conjunctivae and secretions clear            Ears- Hearing, canals-normal            Nose- Clear, no-Septal dev, mucus, polyps, erosion, perforation             Throat- Mallampati II , mucosa clear , drainage- none, tonsils- atrophic, dentures Neck- flexible , trachea midline, no stridor , thyroid nl, carotid no bruit Chest - symmetrical excursion , unlabored           Heart/CV-  RRR occasional extra beat , no murmur , no gallop  , no rub, nl s1 s2                           - JVD- none , edema- none, stasis changes- none, varices- none           Lung- clear, wheeze- none, cough- none , dullness-none, rub- none           Chest wall-  Abd-  Br/ Gen/ Rectal- Not done, not indicated Extrem- cyanosis- none, clubbing, none, atrophy- none, strength- nl Neuro- grossly intact to observation

## 2016-03-10 ENCOUNTER — Ambulatory Visit (INDEPENDENT_AMBULATORY_CARE_PROVIDER_SITE_OTHER): Payer: Medicare Other | Admitting: Cardiovascular Disease

## 2016-03-10 ENCOUNTER — Other Ambulatory Visit: Payer: Self-pay | Admitting: Cardiovascular Disease

## 2016-03-10 ENCOUNTER — Encounter: Payer: Self-pay | Admitting: Cardiovascular Disease

## 2016-03-10 VITALS — BP 126/66 | HR 54 | Ht 69.0 in | Wt 189.0 lb

## 2016-03-10 DIAGNOSIS — I70211 Atherosclerosis of native arteries of extremities with intermittent claudication, right leg: Secondary | ICD-10-CM | POA: Diagnosis not present

## 2016-03-10 DIAGNOSIS — I739 Peripheral vascular disease, unspecified: Secondary | ICD-10-CM

## 2016-03-10 NOTE — Progress Notes (Signed)
03/10/2016 Brian Brock   [birth year]  [id number]  Primary Physician [name], MD Primary Cardiologist: Lorretta Harp MD Renae Gloss  HPI:  Brian Brock is a 70 year old gentleman with a history of CAD status post bypass grafting in the past and subsequent LAD intervention by Dr. Claiborne Billings. I last saw him in the office 02/04/16 . He has had bilateral SFA stents placed as well.[name] He's had right greater than left leg; occasional with recent Doppler studies suggest a high-frequency signal in his mid right SFA. He underwent peripheral angiography and intervention by myself 09/26/15 which time I stented his left external iliac artery. I did demonstrate 5060% segmental stenosis of his mid left SFA with two-vessel runoff. He had a 90% calcified exophytic plaque in his right common femoral artery, 90% "in-stent restenosis within his replaced right SFA stent for any percent focal stenosis just proximal to the stent, 95% calcified stenosis distal to the stent with two-vessel runoff. He essentially underwent right common femoral endarterectomy and patch angioplasty by Dr. Trula Slade A/4/17 which moderately improved his claudication. He underwent staged right SFA diamondback orbital rotational atherectomy, drug eluting balloon angioplasty of "in-stent restenosis within the mid right SFA nitinol stent as well as high-grade calcified fairly focal mid to distal right SFA stenosis beyond the stent. He had an excellent angiographic, clinical and ultrasound result. His right ABI improved from 0.54 up to 0.93 and his claudication resolved.   Current Outpatient Prescriptions  Medication Sig Dispense Refill  . aspirin EC 81 MG tablet Take 81 mg by mouth every evening.     . cetirizine (ZYRTEC) 10 MG tablet Take 10 mg by mouth daily.    . cholestyramine (QUESTRAN) 4 G packet Take 1 packet by mouth 2 (two) times daily with a meal. 60 each 11  . clopidogrel (PLAVIX) 75 MG tablet TAKE ONE TABLET BY  MOUTH EVERY DAY 90 tablet 0  . isosorbide mononitrate (IMDUR) 30 MG 24 hr tablet TAKE 1/2 TABLET BY MOUTH EVERY DAY 15 tablet 0  . Lactobacillus (FLORAJEN ACIDOPHILUS PO) Take by mouth daily.    [name] levothyroxine (SYNTHROID, LEVOTHROID) 25 MCG tablet Take 25 mcg by mouth daily before breakfast.     . losartan (COZAAR) 100 MG tablet TAKE ONE TABLET BY MOUTH EVERY DAY. 30 tablet 5  . nebivolol (BYSTOLIC) 5 MG tablet Take 0.5 tablets (2.5 mg total) by mouth daily. 30 tablet 6  . NITROSTAT 0.4 MG SL tablet Place 1 tablet (0.4 mg total) under the tongue every 5 (five) minutes as needed for chest pain. 25 tablet 3  . pantoprazole (PROTONIX) 40 MG tablet Take 1 tablet by mouth 2 (two) times daily.     . rosuvastatin (CRESTOR) 20 MG tablet Take 1 tablet (20 mg total) by mouth daily. 90 tablet 3  . zolpidem (AMBIEN) 10 MG tablet Take 5 mg by mouth at bedtime as needed for sleep.     No current facility-administered medications for this visit.     Allergies  Allergen Reactions  . Metoprolol Other (See Comments)    Fatigue and malaise  . Levofloxacin     Unknown reaction  . Meloxicam Other (See Comments)    Achy, fatigue  . Metaxalone     Unknown reaction  . Niaspan [Niacin] Other (See Comments)    Unknown reaction  . Tricor [Fenofibrate] Other (See Comments)    Tired/weak  . Fish Oil Rash  . Sulfonamide Derivatives Rash    unknown reaction -  bad rash    Social History   Social History  . Marital status: Married    Spouse name: N/A  . Number of children: N/A  . Years of education: N/A   Occupational History  . Not on file.   Social History Main Topics  . Smoking status: Current Some Day Smoker    Packs/day: 0.50    Years: 56.00    Types: Cigarettes    Last attempt to quit: 07/24/2014  . Smokeless tobacco: Current User    Types: Chew     Comment: 09/26/2015 "I chew once in awhile"  . Alcohol use Yes     Comment: 09/26/2015 "varies; might have a few drinks q 2 months"  . Drug use:  No  . Sexual activity: Not Currently   Other Topics Concern  . Not on file   Social History Narrative  . No narrative on file     Review of Systems: General: negative for chills, fever, night sweats or weight changes.  Cardiovascular: negative for chest pain, dyspnea on exertion, edema, orthopnea, palpitations, paroxysmal nocturnal dyspnea or shortness of breath Dermatological: negative for rash Respiratory: negative for cough or wheezing Urologic: negative for hematuria Abdominal: negative for nausea, vomiting, diarrhea, bright red blood per rectum, melena, or hematemesis Neurologic: negative for visual changes, syncope, or dizziness All other systems reviewed and are otherwise negative except as noted above.    Blood pressure 126/66, pulse (!) 54, height 5\' 9"  (1.753 m), weight 189 lb (85.7 kg).  General appearance: alert and no distress Neck: no adenopathy, no carotid bruit, no JVD, supple, symmetrical, trachea midline and thyroid not enlarged, symmetric, no tenderness/mass/nodules Lungs: clear to auscultation bilaterally Heart: regular rate and rhythm, S1, S2 normal, no murmur, click, rub or gallop Extremities: extremities normal, atraumatic, no cyanosis or edema  EKG not performed today  ASSESSMENT AND PLAN:   PVD- Lt SFA stent '04, Rt SFA stent '08 Mr. Claiborne Billings returns today after his recent peripheral vascular procedure which I performed 02/20/16. He has had bilateral SFA stents placed in the past. I angiogram TM 09/26/15 I stented his left external iliac artery. At that time I did demonstrate a 90% calcified exophytic plaque in his right common femoral artery. He underwent common femoral artery endarterectomy and patch angioplasty by Dr. Trula Slade with excellent angiographic and clinical result. However, because of continued claudication and residual disease in the right SFA I performed drug-eluting balloon angioplasty of his "in-stent restenosis reducing 80% stenosis to less than  20% and diamondback orbital rotational arthrectomy of 8 calcified plaque just distal to the stent. His subsequent Doppler studies revealed a right ABI that he had increased from 0.54 up to 0.93. His claudication symptoms have resolved.      Lorretta Harp MD FACP,FACC,FAHA, [facility], [place] 03/10/2016 12:26 PM

## 2016-03-10 NOTE — Assessment & Plan Note (Signed)
[name] returns today after his recent peripheral vascular procedure which I performed 02/20/16. He has had bilateral SFA stents placed in the past. I angiogram TM 09/26/15 I stented his left external iliac artery. At that time I did demonstrate a 90% calcified exophytic plaque in his right common femoral artery. He underwent common femoral artery endarterectomy and patch angioplasty by Dr. Trula Slade with excellent angiographic and clinical result. However, because of continued claudication and residual disease in the right SFA I performed drug-eluting balloon angioplasty of his "in-stent restenosis reducing 80% stenosis to less than 20% and diamondback orbital rotational arthrectomy of 8 calcified plaque just distal to the stent. His subsequent Doppler studies revealed a right ABI that he had increased from 0.54 up to 0.93. His claudication symptoms have resolved.

## 2016-03-10 NOTE — Patient Instructions (Signed)
Medication Instructions:  Your physician recommends that you continue on your current medications as directed. Please refer to the Current Medication list given to you today.   Testing/Procedures: Your physician has requested that you have a lower extremity arterial doppler- During this test, ultrasound is used to evaluate arterial blood flow in the legs. Allow approximately one hour for this exam. 6 months and 12 months   Follow-Up: Your physician wants you to follow-up in: 12 months with Dr. Gwenlyn Found. You will receive a reminder letter in the mail two months in advance. If you don't receive a letter, please call our office to schedule the follow-up appointment.  If you need a refill on your cardiac medications before your next appointment, please call your pharmacy.

## 2016-03-13 ENCOUNTER — Encounter: Payer: Self-pay | Admitting: Cardiovascular Disease

## 2016-03-13 ENCOUNTER — Ambulatory Visit (INDEPENDENT_AMBULATORY_CARE_PROVIDER_SITE_OTHER): Payer: Medicare Other | Admitting: Cardiovascular Disease

## 2016-03-13 VITALS — BP 111/77 | HR 56 | Ht 69.0 in | Wt 189.2 lb

## 2016-03-13 DIAGNOSIS — I2510 Atherosclerotic heart disease of native coronary artery without angina pectoris: Secondary | ICD-10-CM | POA: Diagnosis not present

## 2016-03-13 DIAGNOSIS — E785 Hyperlipidemia, unspecified: Secondary | ICD-10-CM | POA: Diagnosis not present

## 2016-03-13 DIAGNOSIS — I739 Peripheral vascular disease, unspecified: Secondary | ICD-10-CM

## 2016-03-13 DIAGNOSIS — I70211 Atherosclerosis of native arteries of extremities with intermittent claudication, right leg: Secondary | ICD-10-CM | POA: Diagnosis not present

## 2016-03-13 DIAGNOSIS — Z716 Tobacco abuse counseling: Secondary | ICD-10-CM

## 2016-03-13 NOTE — Patient Instructions (Signed)
Your physician wants you to follow-up in: 6 months or sooner if needed. You will receive a reminder letter in the mail two months in advance. If you don't receive a letter, please call our office to schedule the follow-up appointment.   If you need a refill on your cardiac medications before your next appointment, please call your pharmacy. 

## 2016-03-13 NOTE — Progress Notes (Signed)
Patient ID: Brian Brock, male   DOB: October 18, 1945, 70 y.o.   MRN: [id number]     HPI: Brian Brock is a 70 y.o. male who is a former patient of Dr. Terance Brock.  I last saw him in August 2016 and he presents for a 14 month follow-up evaluation.    Mr. Magnussen has known CAD, hypertension, hyperlipidemia, hypothyroidism, peripheral vascular disease,probable metabolic syndrome and ongoing tobacco use. He has undergone prior percutaneous coronary interventions with stenting to his LAD and RCA  in 1997, 1998, and in 2004 to his proximal left circumflex coronary artery. He also has established PVD and underwent stenting to his left SFA in 2004 and right SFA in 2008. There is a history of tobacco use, hyperlipidemia and in April 2014 he presented to [facility] with chest pain and T wave abnormalities.  A nuclear perfusion study demonstrated reversible ischemia in the apical segment of the anterior wall. He underwent repeat cardiac catheterization on 09/07/2012 by me and was found to have preserved LV function with minimal focal region of mild inferior hypocontractility. He had significant progression of CAD with ostial 90% eccentric stenosis of the LAD extending into the distal left main proximal to a previously placed proximal LAD stent. There also is 90% stenosis of the LAD after the stented segment. The circumflex vessel is 60% ostial stenosis followed by 60% proximal stenosis prior to the previously placed stent. The RCA stent was patent.  CABG surgery was recommended and this was done by Dr. Roxan Brock on 09/12/2012 with an off-pump LIMA to LAD and SVG to circumflex marginal 1 vessel.  In 2014 Mr. Skelley had rotator cuff surgery. He  tolerated this well. He does have a history of peripheral vascular disease and is status post right common femoral and proximal SFA stenting with left SFA stenting. A followup  duplex study on 04/18/2013 demonstrated ABIs were excellent bilaterally at 1.1 with  less than 49% diameter reduction noted in the right common iliac, right common femoral and SFA stent, and was less than 50% diameter reduction in the right distal SFA. There was 50-69% diameter reduction in the left external iliac artery and less than 49% and left common femoral. The left SFA stent was widely patent.  In  February 2016 he developed an episode of increasing shortness of breath associated with diaphoresis.  Two weeks later in the cold weather he developed severe chest tightness with shortness of breath and blood pressure elevation to 354 systolically.  He was seen by Brian Brock on 07/06/2014 after he had again developed some chest pain while moving 40 pounds of bags of salt that he had put in a wheelbarrow.  I reviewed his office note. A nuclear perfusion study which was done on 07/13/2014.  This was significantly changed from his prior study and now was felt to be high risk with a large severe partially reversible anterior apical defect consistent with prior apical infarct, but now with evidence for severe ischemia in the distal anterior wall and apex.  There was apical hypokinesis.  Ejection fraction was 52%.  He was started on nitrates and presented to my office in follow-up.  I recommended definitive cardiac catheterization and on 07/24/2014.  This was performed revealed multivessel native CAD was 70% ostial LAD stenosis, 40-50% stenosis in the proximal LAD stent in 1995% stenosis after the stented segment in the native LAD with 60% ostial septal perforating artery stenosis.  There was 20 and 30% proximal circumflex stenoses with a  widely patent circumflex stent and had a widely patent proximal and mid RCA stents.  His LIMA graft was occluded in its midsegment prior to its anastomosis into the LAD.  He had widely patent vein graft supplying the distal circumflex marginal vessel.  He underwent successful PCI to the LAD at its ostium and in its midsegment treated with angioscope scoring balloon,  insertion of a 3.034 mm Resolute DES stent extending from the ostium to the mid LAD, postdilated 3.41 mm with entire region been reduced to 0%.  Since I saw him one year ago, he has undergone 3 PVD procedures.  In May 2017.  He underwent peripheral angiography and stenting of his left external iliac artery.  He also had a 90% calcific plaque in the right common femoral artery and 90% in-stent restenosis with in his replaced right SFA stent.  He underwent right common femoral endarterectomy and patch angioplasty by Dr. Velta Brock in August 2017 which moderately improved.  His claudication and in September underwent staged right SFA diamondback orbital rotational atherectomy and drug-eluting balloon angioplasty of his in-stent restenosis within the mid right SFA  stent.  Subsequently, his claudication has improved.  A follow-up lower extremity Doppler study shows significantly improved ABIs at 0.93 on the right and 1.0 on the left  He denies any recent anginal type symptoms.  Unfortunately, he continues to smoke cigarettes.  He states he is smoking 6-7 cigarettes per day.  He presents for evaluation.  Past Medical History:  Diagnosis Date  . Allergic rhinitis   . Anginal pain ([place]) 1997  . Arthritis    "lower back" (09/26/2015)  . Childhood asthma   . Chronic bronchitis (Brian Brock)    Yearly  . COPD (chronic obstructive pulmonary disease) ([place])   . Coronary artery disease    Multiple stents  . Dyslipidemia   . Dysrhythmia   . GERD (gastroesophageal reflux disease)   . History of hiatal hernia   . Hyperlipemia   . Hypertension   . Hypothyroidism   . Myocardial infarct 1997   "mild"  . Peripheral vascular disease (Brian Brock)    prior stenting    Past Surgical History:  Procedure Laterality Date  . CARDIAC CATHETERIZATION  2004   had remote tandem stents in 1997 and 1998 for LAD lesions, RCA stent in 1997 , midprto=ion, and a proximal RCA  SENT IN 1999, all DES; some were IVIS  guide.  [name] CARDIAC  CATHETERIZATION  09/07/2012   demonstrated severe 2- vessel disease,patent LAD,patient circ and patent right stents, but 90% ostial LAD whic was new.  . CAROTID DOPPLER  100/24/11   RGT BULB and PROXIMAL ICA 0-49%; LFT. ICA  0-49 ; RGT SUBCLAVIAN ARTERY < 50% ; LFT SUBCLAVIAN normal patency; RGT  and LFT  VERTEBRAL ARTERIES  . CORONARY ANGIOPLASTY    . CORONARY ANGIOPLASTY WITH STENT PLACEMENT  '97, '98, '04, '06, '08, '10   seven cardiac stents  . CORONARY ARTERY BYPASS GRAFT N/A 09/12/2012   Procedure: OFF PUMP CORONARY ARTERY BYPASS GRAFTING (CABG);  Surgeon: Melrose Nakayama, MD;  Location: [place];  Service: Open Heart Surgery;  Laterality: N/A;  Times two using left internal mammary artery and endoscopically harvested right saphenous vein  ;LIMA-LAD and SVG -OM1  . DOPPLER ECHOCARDIOGRAPHY  11/23/2012,09/30/2010    LV EF 50-55%----09/30/2010-LV EF = >55%  . ENDARTERECTOMY FEMORAL Right 12/19/2015   Procedure: ENDARTERECTOMY RIGHT FEMORAL ARTERY;  Surgeon: Serafina Mitchell, MD;  Location: [place];  Service: Vascular;  Laterality:  Right;  . event monitor  11/04/2012-11/17/2012   pt denies this hx on 07/24/2014  . FEMORAL ARTERY STENT Bilateral 7/06  8/08   Lt SFA '06, Rt SFA 8/08  . HERNIA REPAIR  10/2008   "repaired w/gallbladder OR"  . LAPAROSCOPIC CHOLECYSTECTOMY  June 2010  . LEFT HEART CATHETERIZATION WITH CORONARY ANGIOGRAM N/A 09/07/2012   Procedure: LEFT HEART CATHETERIZATION WITH CORONARY ANGIOGRAM;  Surgeon: Troy Sine, MD;  [facility] CATH LAB;  Service: Cardiovascular;  Laterality: N/A;  . LEFT HEART CATHETERIZATION WITH CORONARY/GRAFT ANGIOGRAM N/A 07/24/2014   Procedure: LEFT HEART CATHETERIZATION WITH Beatrix Fetters;  Surgeon: Troy Sine, MD; LAD 70%, 50% ISR, mLAD 99%, CFX 30%, patent stent, RCA patent, LIMA-LAD occluded, SVG-OM 1 patent, EF 50-55%  . LEXISCAN STRESS TEST  09/06/2012  . LOWER ARTERIAL DOPPLER  03/25/2012   FGT CIA 0-49%; RGT CFA AND SFA AT STENT  0-49%; RGT DISTAL SFA = <50%; LFT. EIA  50-69%; LFT. CFA 0-49% ;;LFT. SFA  AT STENT NORMAL PATENCY  . NM MYOVIEW LTD  05/06/2012   EF -STUDY NOT GATED, NORMAL STRESS NUCLEAR ,NORMAL LV FUNCTION  . PATCH ANGIOPLASTY Right 12/19/2015   Procedure: PATCH ANGIOPLASTY RIGHT FEMORAL ARTERY;  Surgeon: Serafina Mitchell, MD;  Location: [place];  Service: Vascular;  Laterality: Right;  . PERCUTANEOUS CORONARY STENT INTERVENTION (PCI-S) Right 07/24/2014   Procedure: PERCUTANEOUS CORONARY STENT INTERVENTION (PCI-S);  Surgeon: Troy Sine, MD; ostial LAD PTCA with angioscope scoring balloon, 3.0 x 34 mm Resolute DES extending from the ostium to the mid LAD, covering all significant stenoses   . PERIPHERAL VASCULAR CATHETERIZATION N/A 09/26/2015   Procedure: Abdominal Aortogram w/Lower Extremity;  Surgeon: Lorretta Harp, MD;  Location: [place] CV LAB;  Service: Cardiovascular;  Laterality: N/A;  . PERIPHERAL VASCULAR CATHETERIZATION Right 02/20/2016   Procedure: Peripheral Vascular Atherectomy;  Surgeon: Lorretta Harp, MD;  Location: [place] CV LAB;  Service: Cardiovascular;  Laterality: Right;  SFA  . PERIPHERAL VASCULAR CATHETERIZATION Right 02/20/2016   Procedure: Peripheral Vascular Balloon Angioplasty;  Surgeon: Lorretta Harp, MD;  Location: [place] CV LAB;  Service: Cardiovascular;  Laterality: Right;  SFA  . TONSILLECTOMY  1950's    Allergies  Allergen Reactions  . Metoprolol Other (See Comments)    Fatigue and malaise  . Levofloxacin     Unknown reaction  . Meloxicam Other (See Comments)    Achy, fatigue  . Metaxalone     Unknown reaction  . Niaspan [Niacin] Other (See Comments)    Unknown reaction  . Tricor [Fenofibrate] Other (See Comments)    Tired/weak  . Fish Oil Rash  . Sulfonamide Derivatives Rash    unknown reaction - bad rash    Current Outpatient Prescriptions  Medication Sig Dispense Refill  . aspirin EC 81 MG tablet Take 81 mg by mouth every evening.     .  cetirizine (ZYRTEC) 10 MG tablet Take 10 mg by mouth daily.    . cholestyramine (QUESTRAN) 4 G packet Take 1 packet by mouth 2 (two) times daily with a meal. 60 each 11  . clopidogrel (PLAVIX) 75 MG tablet TAKE ONE TABLET BY MOUTH EVERY DAY 90 tablet 0  . isosorbide mononitrate (IMDUR) 30 MG 24 hr tablet TAKE 1/2 TABLET BY MOUTH EVERY DAY 15 tablet 11  . Lactobacillus (FLORAJEN ACIDOPHILUS PO) Take by mouth daily.    [name] levothyroxine (SYNTHROID, LEVOTHROID) 25 MCG tablet Take 25 mcg by mouth daily before breakfast.     .  losartan (COZAAR) 100 MG tablet TAKE ONE TABLET BY MOUTH EVERY DAY. 30 tablet 5  . nebivolol (BYSTOLIC) 5 MG tablet Take 0.5 tablets (2.5 mg total) by mouth daily. 30 tablet 6  . NITROSTAT 0.4 MG SL tablet Place 1 tablet (0.4 mg total) under the tongue every 5 (five) minutes as needed for chest pain. 25 tablet 3  . pantoprazole (PROTONIX) 40 MG tablet Take 1 tablet by mouth 2 (two) times daily.     . rosuvastatin (CRESTOR) 20 MG tablet Take 1 tablet (20 mg total) by mouth daily. 90 tablet 3  . zolpidem (AMBIEN) 10 MG tablet Take 5 mg by mouth at bedtime as needed for sleep.     No current facility-administered medications for this visit.     Social History   Social History  . Marital status: Married    Spouse name: N/A  . Number of children: N/A  . Years of education: N/A   Occupational History  . Not on file.   Social History Main Topics  . Smoking status: Current Some Day Smoker    Packs/day: 0.50    Years: 56.00    Types: Cigarettes    Last attempt to quit: 07/24/2014  . Smokeless tobacco: Current User    Types: Chew     Comment: 09/26/2015 "I chew once in awhile"  . Alcohol use Yes     Comment: 09/26/2015 "varies; might have a few drinks q 2 months"  . Drug use: No  . Sexual activity: Not Currently   Other Topics Concern  . Not on file   Social History Narrative  . 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 Heis  married , is one child and 4 grandchildren.[name]  He continues to work part time in [employer].  His work is physical and often requires lifting.  He had smoked heavily for 54 years. He is now smoking 3-cigarettes per day.  He does not drink alcohol or use illicit drugs.  Family History  Problem Relation Age of Onset  . Coronary artery disease Father 76  . Heart attack Father   . Coronary artery disease Brother 85    CABG in 2000    ROS General: Negative; No fevers, chills, or night sweats;  HEENT: Negative; No changes in vision or hearing, sinus congestion, difficulty swallowing Pulmonary: Negative; No cough, wheezing, shortness of breath, hemoptysis Cardiovascular: Negative; No chest pain, presyncope, syncope, palpitations Sinus post 3.recent peripheral vascular procedures as noted above GI: Negative; No nausea, vomiting, diarrhea, or abdominal pain GU: Negative; No dysuria, hematuria, or difficulty voiding Musculoskeletal: Improved range of motion in the left shoulder, but also notes intermittent twinges of pain; positive for occasional myalgias  Hematologic/Oncology: Negative; no easy bruising, bleeding Endocrine: Negative; no heat/cold intolerance; no diabetes Neuro: Negative; no changes in balance, headaches Skin: Negative; No rashes or skin lesions Psychiatric: Negative; No behavioral problems, depression Sleep: Negative; No snoring, daytime sleepiness, hypersomnolence, bruxism, restless legs, hypnogognic hallucinations, no cataplexy Other comprehensive 14 point system review is negative.  PE BP 111/77 (BP Location: Right Arm, Patient Position: Sitting, Cuff Size: Normal)   Pulse (!) 56   Ht 5' 9" (1.753 m)   Wt 189 lb 3.2 oz (85.8 kg)   SpO2 97%   BMI 27.94 kg/m    Repeat blood pressure by me 120/78.  Wt Readings from Last 3 Encounters:  03/13/16 189 lb 3.2 oz (85.8 kg)  03/10/16 189 lb (85.7 kg)  03/09/16 190 lb 6.4 oz (86.4 kg)   General: Alert, oriented, no distress.    Skin: normal turgor, no rashes HEENT: Normocephalic, atraumatic. Pupils round and reactive; sclera anicteric;no lid lag.  Nose without nasal septal hypertrophy Mouth/Parynx benign; Mallinpatti scale 3 Neck: No JVD, no carotid bruits Lungs: Decreased breath sounds without audible wheezing or rales Chest wall: Nontender to palpation Heart: RRR, s1 s2 normal with a 2/6 systolic murmur at the left sternal border. No diastolic murmur.  No rubs thrills or heaves. Abdomen: soft, nontender; no hepatosplenomehaly, BS+; abdominal aorta nontender and not dilated by palpation. Pulses: Slightly decreased pulses at the dorsalis pedis and PT bilaterally. Cardiac catheterization site well-healed Back: No CVA tenderness Extremities: no clubbing cyanosis or edema, Homan's sign negative  Neurologic: grossly nonfocal; cranial nerves intact Psychologic: normal affect and mood.  ECG (independently read by me): Sinus bradycardia 56 bpm with sinus arrhythmia and an isolated PVC.  QTc interval normal.  ECG (independently read by me): Sinus bradycardia 51 bpm.  Normal intervals.  No significant ST changes.  May 2016 ECG (independently read by me): Sinus bradycardia 53 bpm.  Normal intervals.  No ST changes.  March 2016 Prior ECG (independently read by me): Normal sinus rhythm at 66 beats per minute.  No ectopy.  PR interval 150 ms.  QTc interval 421 ms  January 2015 ECG (independently read by me): Normal sinus rhythm at 67 beats per minute; normal intervals.  LABS:  BMET  BMP Latest Ref Rng & Units 02/21/2016 02/12/2016 12/20/2015  Glucose 65 - 99 mg/dL 86 104(H) 101(H)  BUN 6 - 20 mg/dL _0 Creatinine 0.61 - 1.24 mg/dL 1.06 1.05 1.02  Sodium 135 - 145 mmol/L 140 141 139  Potassium 3.5 - 5.1 mmol/L 3.9 4.7 3.6  Chloride 101 - 111 mmol/L 109 107 107  CO2 22 - 32 mmol/L _1 Calcium 8.9 - 10.3 mg/dL 8.3(L) 8.9 8.3(L)     Hepatic Function Panel    Hepatic Function Latest Ref Rng & Units  12/11/2015 07/17/2014 09/05/2012  Total Protein 6.5 - 8.1 g/dL 6.0(L) 6.2 6.4  Albumin 3.5 - 5.0 g/dL 3.4(L) 3.9 3.2(L)  AST 15 - 41 U/L 24 22 38(H)  ALT 17 - 63 U/L 19 21 56(H)  Alk Phosphatase 38 - 126 U/L 87 91 94  Total Bilirubin 0.3 - 1.2 mg/dL 0.5 0.7 0.5    CBC  CBC Latest Ref Rng & Units 02/21/2016 02/12/2016 12/20/2015  WBC 4.0 - 10.5 K/uL 8.9 8.1 10.6(H)  Hemoglobin 13.0 - 17.0 g/dL 11.8(L) 13.9 11.6(L)  Hematocrit 39.0 - 52.0 % 36.6(L) 43.1 34.6(L)  Platelets 150 - 400 K/uL 188 239 196     BNP No results found for: PROBNP  Lipid Panel     Component Value Date/Time   CHOL 155 09/06/2012 0455   TRIG 179 (H) 09/06/2012 0455   HDL 52 09/06/2012 0455   CHOLHDL 3.0 09/06/2012 0455   VLDL 36 09/06/2012 0455   LDLCALC 67 09/06/2012 0455     RADIOLOGY: No results found.    ASSESSMENT AND PLAN: Mr. Antony Haste is a 70 year old white male who has established CAD and PVD.   He is status post CABG surgery in April 2014 by Dr. Roxan Brock. Previously he had undergone prior stenting to his proximal LAD, circumflex coronary artery as well as right coronary artery.  He developed recurrent symptomatology and catheterization in 2014 revealed an occluded LIMA graft and high-grade 70% ostial LAD stenosis  with 95% in-stent restenosis in the previously placed stent.  He underwent successful Angiosculpt scoring balloon insertion of a 3.034 mm Resolute DES stent extending from the ostium to the mid LAD with excellent result.  He is been without anginal symptomatology since.  His blood pressure today is controlled at 120/78, rechecked by me and he continues to be on losartan 100 mg daily and Bystolic 2.5 mg.  He is bradycardic but asymptomatic on this low-dose Bystolic.  He is now on aspirin and Plavix for dual antiplatelet therapy.  He's not having anginal symptoms but continues to take isosorbide, mononitrate for CAD.  I reviewed his 3 PVD interventions and surgery which was done over the  past 5 months.  His claudication symptoms have improved.  He has a history of hypothyroidism and takes low-dose levothyroxine at 25 g.  He is on high potency statin with Crestor 20 mg with target LDL less than 70.  He tells me Dr. Unk Lightning has checked his laboratory and I have suggested that if his LDL is greater than 70.  He should increase his Crestor to 40 mg.  I long discussion with him today concerning his continued smoking and the importance of complete smoking cessation.  I will see him in 6 months for reevaluation.    Time spent: 25 minutes  Troy Sine, MD, [facility]  03/13/2016 6:37 PM

## 2016-04-01 ENCOUNTER — Encounter: Payer: Self-pay | Admitting: Surgery

## 2016-04-02 DIAGNOSIS — J441 Chronic obstructive pulmonary disease with (acute) exacerbation: Secondary | ICD-10-CM | POA: Diagnosis not present

## 2016-04-02 DIAGNOSIS — N401 Enlarged prostate with lower urinary tract symptoms: Secondary | ICD-10-CM | POA: Diagnosis not present

## 2016-04-02 DIAGNOSIS — E1165 Type 2 diabetes mellitus with hyperglycemia: Secondary | ICD-10-CM | POA: Diagnosis not present

## 2016-04-02 DIAGNOSIS — E782 Mixed hyperlipidemia: Secondary | ICD-10-CM | POA: Diagnosis not present

## 2016-04-02 DIAGNOSIS — I10 Essential (primary) hypertension: Secondary | ICD-10-CM | POA: Diagnosis not present

## 2016-04-02 DIAGNOSIS — Z125 Encounter for screening for malignant neoplasm of prostate: Secondary | ICD-10-CM | POA: Diagnosis not present

## 2016-04-02 DIAGNOSIS — Z0000 Encounter for general adult medical examination without abnormal findings: Secondary | ICD-10-CM | POA: Diagnosis not present

## 2016-04-07 ENCOUNTER — Other Ambulatory Visit: Payer: Self-pay | Admitting: Cardiology

## 2016-04-07 NOTE — Telephone Encounter (Signed)
Rx has been sent to the pharmacy electronically. ° °

## 2016-04-13 ENCOUNTER — Ambulatory Visit: Payer: Medicare Other | Admitting: Surgery

## 2016-04-14 DIAGNOSIS — H25811 Combined forms of age-related cataract, right eye: Secondary | ICD-10-CM | POA: Diagnosis not present

## 2016-04-20 ENCOUNTER — Telehealth: Payer: Self-pay

## 2016-04-20 DIAGNOSIS — R1909 Other intra-abdominal and pelvic swelling, mass and lump: Secondary | ICD-10-CM

## 2016-04-20 DIAGNOSIS — R1031 Right lower quadrant pain: Secondary | ICD-10-CM

## 2016-04-20 NOTE — Telephone Encounter (Signed)
Pt. Called to report he has a silver-dollar-sized raised area, above right groin incision; described as "soft".  Reported he has an intermittent dull pain in the area; described as "a burning sensation." Stated there is no redness, but he noticed some warmth in one area of right groin. Denied any drainage from area.  Denied any fever/ chills.  Denied any recent activity that caused stress or strain to the area.  Discussed with Dr. Trula Slade.  Advised to schedule for a right groin pseudoaneurysm duplex and office appt. With NP.  Notified pt. that a Scheduler will call him with appt. Information.  Agreed.

## 2016-04-21 ENCOUNTER — Encounter: Payer: Self-pay | Admitting: Family

## 2016-04-22 ENCOUNTER — Ambulatory Visit (HOSPITAL_COMMUNITY)
Admission: RE | Admit: 2016-04-22 | Discharge: 2016-04-22 | Disposition: A | Discharge status: Home / Self Care | Payer: Medicare Other | Source: Ambulatory Visit | Attending: Vascular Surgery | Admitting: Vascular Surgery

## 2016-04-22 ENCOUNTER — Telehealth: Payer: Self-pay | Admitting: Internal Medicine

## 2016-04-22 ENCOUNTER — Encounter: Payer: Self-pay | Admitting: Family

## 2016-04-22 ENCOUNTER — Ambulatory Visit (INDEPENDENT_AMBULATORY_CARE_PROVIDER_SITE_OTHER): Payer: Medicare Other | Admitting: Family

## 2016-04-22 VITALS — BP 127/69 | HR 54 | Temp 97.2°F | Ht 69.0 in | Wt 187.6 lb

## 2016-04-22 DIAGNOSIS — R1031 Right lower quadrant pain: Secondary | ICD-10-CM

## 2016-04-22 DIAGNOSIS — I70211 Atherosclerosis of native arteries of extremities with intermittent claudication, right leg: Secondary | ICD-10-CM

## 2016-04-22 DIAGNOSIS — Z9862 Peripheral vascular angioplasty status: Secondary | ICD-10-CM

## 2016-04-22 DIAGNOSIS — R1909 Other intra-abdominal and pelvic swelling, mass and lump: Secondary | ICD-10-CM | POA: Diagnosis not present

## 2016-04-22 MED ORDER — UMECLIDINIUM BROMIDE 62.5 MCG/INH IN AEPB: 1.0000 | INHALATION_SPRAY | Freq: Every day | RESPIRATORY_TRACT | 11 refills | Status: DC

## 2016-04-22 NOTE — Progress Notes (Signed)
[facility] [place]   CC: Follow up peripheral artery occlusive disease  History of Present Illness Brian Brock is a 70 y.o. male patient of Dr. Trula Slade who is s/p right iliofemoral endarterectomy with patch angioplasty on 12/19/2015.  He has recovered from his surgery nicely.  He does have some complaints of numbness along the medial side of his leg, he no longer c/o this sensation in his right testicle.  He is slowly increasing his activity.    Pt is also s/p  1. Contralateral access placement of sheath from left common femoral artery to right external iliac artery (second order catheter placement) 2. NAV 6 distal protection device placed in the right popliteal artery 3. Diamondback orbital rotational atherectomy right SFA 4. Angiosculpt cutting balloon atherectomy right SFA "in-stent restenosis" followed by drug eluting balloon angioplasty right SFA 5. Completion angiography using bolus chase, digital subtraction, step table technique. On 02-20-16 by Dr. Gwenlyn Found, access via the left common femoral artery.  Pt returns today after he called to report he has a silver-dollar-sized raised area, above right groin incision; described as "soft".  Reported he has an intermittent dull pain in the area; described as "a burning sensation." Stated there is no redness, but he noticed some warmth in one area of right groin. Denied any drainage from area.  Denied any fever/ chills.  Denied any recent activity that caused stress or strain to the area other than lifting a case of water a week prior to this.    Pt denies claudication sx's with walking. He denies any known hx of stroke or TIA.   Pt Diabetic: No Pt smoker: smoker  (1/2 ppd x 50 yrs)  Pt meds include: Statin :Yes Betablocker: Yes ASA: Yes Other anticoagulants/antiplatelets: Plavix  Past Medical History:  Diagnosis Date  . Allergic rhinitis   .  Anginal pain ([place]) 1997  . Arthritis    "lower back" (09/26/2015)  . Childhood asthma   . Chronic bronchitis ([place])    Yearly  . COPD (chronic obstructive pulmonary disease) ([place])   . Coronary artery disease    Multiple stents  . Dyslipidemia   . Dysrhythmia   . GERD (gastroesophageal reflux disease)   . History of hiatal hernia   . Hyperlipemia   . Hypertension   . Hypothyroidism   . Myocardial infarct 1997   "mild"  . Peripheral vascular disease ([place])    prior stenting    Social History Social History  Substance Use Topics  . Smoking status: Current Some Day Smoker    Packs/day: 0.50    Years: 56.00    Types: Cigarettes    Last attempt to quit: 07/24/2014  . Smokeless tobacco: Current User    Types: Chew     Comment: 09/26/2015 "I chew once in awhile"  . Alcohol use Yes     Comment: 09/26/2015 "varies; might have a few drinks q 2 months"    Family History Family History  Problem Relation Age of Onset  . Coronary artery disease Father 38  . Heart attack Father   . Coronary artery disease Brother 73    CABG in 2000    Past Surgical History:  Procedure Laterality Date  . CARDIAC CATHETERIZATION  2004   had remote tandem stents in 1997 and 1998 for LAD lesions, RCA stent in 1997 , midprto=ion, and a proximal RCA  SENT IN 1999, all DES; some were IVIS  guide.  [name] CARDIAC CATHETERIZATION  09/07/2012   demonstrated  severe 2- vessel disease,patent LAD,patient circ and patent right stents, but 90% ostial LAD whic was new.  . CAROTID DOPPLER  100/24/11   RGT BULB and PROXIMAL ICA 0-49%; LFT. ICA  0-49 ; RGT SUBCLAVIAN ARTERY < 50% ; LFT SUBCLAVIAN normal patency; RGT  and LFT  VERTEBRAL ARTERIES  . CORONARY ANGIOPLASTY    . CORONARY ANGIOPLASTY WITH STENT PLACEMENT  '97, '98, '04, '06, '08, '10   seven cardiac stents  . CORONARY ARTERY BYPASS GRAFT N/A 09/12/2012   Procedure: OFF PUMP CORONARY ARTERY BYPASS GRAFTING (CABG);  Surgeon: Melrose Nakayama, MD;  Location: [facility];  Service: Open Heart Surgery;  Laterality: N/A;  Times two using left internal mammary artery and endoscopically harvested right saphenous vein  ;LIMA-LAD and SVG -OM1  . DOPPLER ECHOCARDIOGRAPHY  11/23/2012,09/30/2010    LV EF 50-55%----09/30/2010-LV EF = >55%  . ENDARTERECTOMY FEMORAL Right 12/19/2015   Procedure: ENDARTERECTOMY RIGHT FEMORAL ARTERY;  Surgeon: Serafina Mitchell, MD;  Location: [facility];  Service: Vascular;  Laterality: Right;  . event monitor  11/04/2012-11/17/2012   pt denies this hx on 07/24/2014  . FEMORAL ARTERY STENT Bilateral 7/06  8/08   Lt SFA '06, Rt SFA 8/08  . HERNIA REPAIR  10/2008   "repaired w/gallbladder OR"  . LAPAROSCOPIC CHOLECYSTECTOMY  June 2010  . LEFT HEART CATHETERIZATION WITH CORONARY ANGIOGRAM N/A 09/07/2012   Procedure: LEFT HEART CATHETERIZATION WITH CORONARY ANGIOGRAM;  Surgeon: Troy Sine, MD;  Location: [facility] CATH LAB;  Service: Cardiovascular;  Laterality: N/A;  . LEFT HEART CATHETERIZATION WITH CORONARY/GRAFT ANGIOGRAM N/A 07/24/2014   Procedure: LEFT HEART CATHETERIZATION WITH Beatrix Fetters;  Surgeon: Troy Sine, MD; LAD 70%, 50% ISR, mLAD 99%, CFX 30%, patent stent, RCA patent, LIMA-LAD occluded, SVG-OM 1 patent, EF 50-55%  . LEXISCAN STRESS TEST  09/06/2012  . LOWER ARTERIAL DOPPLER  03/25/2012   FGT CIA 0-49%; RGT CFA AND SFA AT STENT 0-49%; RGT DISTAL SFA = <50%; LFT. EIA  50-69%; LFT. CFA 0-49% ;;LFT. SFA  AT STENT NORMAL PATENCY  . NM MYOVIEW LTD  05/06/2012   EF -STUDY NOT GATED, NORMAL STRESS NUCLEAR ,NORMAL LV FUNCTION  . PATCH ANGIOPLASTY Right 12/19/2015   Procedure: PATCH ANGIOPLASTY RIGHT FEMORAL ARTERY;  Surgeon: Serafina Mitchell, MD;  Location: [facility];  Service: Vascular;  Laterality: Right;  . PERCUTANEOUS CORONARY STENT INTERVENTION (PCI-S) Right 07/24/2014   Procedure: PERCUTANEOUS CORONARY STENT INTERVENTION (PCI-S);  Surgeon: Troy Sine, MD; ostial LAD PTCA with angioscope scoring balloon, 3.0 x 34 mm Resolute DES  extending from the ostium to the mid LAD, covering all significant stenoses   . PERIPHERAL VASCULAR CATHETERIZATION N/A 09/26/2015   Procedure: Abdominal Aortogram w/Lower Extremity;  Surgeon: Lorretta Harp, MD;  Location: [facility] CV LAB;  Service: Cardiovascular;  Laterality: N/A;  . PERIPHERAL VASCULAR CATHETERIZATION Right 02/20/2016   Procedure: Peripheral Vascular Atherectomy;  Surgeon: Lorretta Harp, MD;  Location: [place] [facility] CV LAB;  Service: Cardiovascular;  Laterality: Right;  SFA  . PERIPHERAL VASCULAR CATHETERIZATION Right 02/20/2016   Procedure: Peripheral Vascular Balloon Angioplasty;  Surgeon: Lorretta Harp, MD;  Location: [facility] CV LAB;  Service: Cardiovascular;  Laterality: Right;  SFA  . TONSILLECTOMY  1950's    Allergies  Allergen Reactions  . Metoprolol Other (See Comments)    Fatigue and malaise  . Levofloxacin     Unknown reaction  . Meloxicam Other (See Comments)    Achy, fatigue  . Metaxalone  Unknown reaction  . Niaspan [Niacin] Other (See Comments)    Unknown reaction  . Tricor [Fenofibrate] Other (See Comments)    Tired/weak  . Fish Oil Rash  . Sulfonamide Derivatives Rash    unknown reaction - bad rash    Current Outpatient Prescriptions  Medication Sig Dispense Refill  . aspirin EC 81 MG tablet Take 81 mg by mouth every evening.     . cetirizine (ZYRTEC) 10 MG tablet Take 10 mg by mouth daily.    . cholestyramine (QUESTRAN) 4 G packet Take 1 packet by mouth 2 (two) times daily with a meal. 60 each 11  . clopidogrel (PLAVIX) 75 MG tablet TAKE ONE TABLET BY MOUTH EVERY DAY 90 tablet 0  . co-enzyme Q-10 30 MG capsule Take 30 mg by mouth 3 (three) times daily.    . isosorbide mononitrate (IMDUR) 30 MG 24 hr tablet TAKE 1/2 TABLET BY MOUTH EVERY DAY 15 tablet 11  . Lactobacillus (FLORAJEN ACIDOPHILUS PO) Take by mouth daily.    [name] levothyroxine (SYNTHROID, LEVOTHROID) 25 MCG tablet Take 25 mcg by mouth daily before breakfast.     .  losartan (COZAAR) 100 MG tablet TAKE ONE TABLET BY MOUTH EVERY DAY 30 tablet 11  . nebivolol (BYSTOLIC) 5 MG tablet Take 0.5 tablets (2.5 mg total) by mouth daily. 30 tablet 6  . pantoprazole (PROTONIX) 40 MG tablet Take 1 tablet by mouth 2 (two) times daily.     . Pomegranate, Punica granatum, (POMEGRANATE PO) Take by mouth.    . rosuvastatin (CRESTOR) 20 MG tablet Take 1 tablet (20 mg total) by mouth daily. 90 tablet 3  . zolpidem (AMBIEN) 10 MG tablet Take 5 mg by mouth at bedtime as needed for sleep.    [name] NITROSTAT 0.4 MG SL tablet Place 1 tablet (0.4 mg total) under the tongue every 5 (five) minutes as needed for chest pain. (Patient not taking: Reported on 04/22/2016) 25 tablet 3   No current facility-administered medications for this visit.     ROS: See HPI for pertinent positives and negatives.   Physical Examination  Vitals:   04/22/16 1325 04/22/16 1329  BP: 121/69 127/69  Pulse: (!) 56 (!) 54  Temp: 97.2 F (36.2 C)   TempSrc: Oral   SpO2: 96%   Weight: 187 lb 9.6 oz (85.1 kg)   Height: 5\' 9"  (1.753 m)    Body mass index is 27.7 kg/m.  General: A&O x 3, WDWN. Gait: normal Eyes: PERRLA. Pulmonary: Respirations are non labored, CTAB, fair air movement Cardiac: regular rhythm, no detected murmur.         Carotid Bruits Right Left   Negative Negative  Aorta is not palpable. Radial pulses: 2+ palpable and =                           VASCULAR EXAM: Extremities without ischemic changes, without Gangrene; without open wounds. Right femoral incision is well healed, no lump or swelling palpated.  LE Pulses Right Left       FEMORAL   palpable   palpable        POPLITEAL   Not palpable   not palpable       POSTERIOR TIBIAL   palpable    palpable        DORSALIS PEDIS      ANTERIOR TIBIAL  palpable  palpable    Abdomen: soft, NT, no palpable masses. Skin: no  rashes, no ulcers noted. Musculoskeletal: no muscle wasting or atrophy.  Neurologic: A&O X 3; Appropriate Affect ; SENSATION: normal; MOTOR FUNCTION:  moving all extremities equally, motor strength 5/5 throughout. Speech is fluent/normal. CN 2-12 intact.   ASSESSMENT: Brian Brock is a 70 y.o. male who is s/p right iliofemoral endarterectomy with patch angioplasty on 12/19/2015 by Dr. Trula Slade and atherectomy and balloon angioplasty of right SFA by Dr. Gwenlyn Found on 02-20-16. Pt reports a 3 day hx of feeling a small lump at the right inguinal incision along with intermittent dull pain. He denies any known injury, but does report picking up a cas of water a week prior to this.  Dr. Scot Dock spoke with and examined pt.  DATA Right groin duplex today: No evidence of pseudoaneurysm or hematoma of the right groin.  PLAN:  The patient was counseled re smoking cessation and given several free resources re smoking cessation.   Based on the patient's vascular studies and examination, pt will return to clinic in January 2018 with Dr. Trula Slade as already scheduled.  I discussed in depth with the patient the nature of atherosclerosis, and emphasized the importance of maximal medical management including strict control of blood pressure, blood glucose, and lipid levels, obtaining regular exercise, and cessation of smoking.  The patient is aware that without maximal medical management the underlying atherosclerotic disease process will progress, limiting the benefit of any interventions.  The patient was given information about PAD including signs, symptoms, treatment, what symptoms should prompt the patient to seek immediate medical care, and risk reduction measures to take.  Clemon Chambers, RN, MSN, FNP-C [facility] Phone: [phone]  Clinic MD: Scot Dock  04/22/16 1:37 PM

## 2016-04-22 NOTE — Patient Instructions (Signed)
Peripheral Vascular Disease Peripheral vascular disease (PVD) is a disease of the blood vessels that are not part of your heart and brain. A simple term for PVD is poor circulation. In most cases, PVD narrows the blood vessels that carry blood from your heart to the rest of your body. This can result in a decreased supply of blood to your arms, legs, and internal organs, like your stomach or kidneys. However, it most often affects a person's lower legs and feet. There are two types of PVD.  Organic PVD. This is the more common type. It is caused by damage to the structure of blood vessels.  Functional PVD. This is caused by conditions that make blood vessels contract and tighten (spasm). Without treatment, PVD tends to get worse over time. PVD can also lead to acute ischemic limb. This is when an arm or limb suddenly has trouble getting enough blood. This is a medical emergency. Follow these instructions at home:  Take medicines only as told by your doctor.  Do not use any tobacco products, including cigarettes, chewing tobacco, or electronic cigarettes. If you need help quitting, ask your doctor.  Lose weight if you are overweight, and maintain a healthy weight as told by your doctor.  Eat a diet that is low in fat and cholesterol. If you need help, ask your doctor.  Exercise regularly. Ask your doctor for some good activities for you.  Take good care of your feet.  Wear comfortable shoes that fit well.  Check your feet often for any cuts or sores. Contact a doctor if:  You have cramps in your legs while walking.  You have leg pain when you are at rest.  You have coldness in a leg or foot.  Your skin changes.  You are unable to get or have an erection (erectile dysfunction).  You have cuts or sores on your feet that are not healing. Get help right away if:  Your arm or leg turns cold and blue.  Your arms or legs become red, warm, swollen, painful, or numb.  You have  chest pain or trouble breathing.  You suddenly have weakness in your face, arm, or leg.  You become very confused or you cannot speak.  You suddenly have a very bad headache.  You suddenly cannot see. This information is not intended to replace advice given to you by your health care provider. Make sure you discuss any questions you have with your health care provider. Document Released: 07/29/2009 Document Revised: 10/10/2015 Document Reviewed: 10/12/2013 Elsevier Interactive Patient Education  2017 Elsevier Inc.      Steps to Quit Smoking Smoking tobacco can be bad for your health. It can also affect almost every organ in your body. Smoking puts you and people around you at risk for many serious long-lasting (chronic) diseases. Quitting smoking is hard, but it is one of the best things that you can do for your health. It is never too late to quit. What are the benefits of quitting smoking? When you quit smoking, you lower your risk for getting serious diseases and conditions. They can include:  Lung cancer or lung disease.  Heart disease.  Stroke.  Heart attack.  Not being able to have children (infertility).  Weak bones (osteoporosis) and broken bones (fractures). If you have coughing, wheezing, and shortness of breath, those symptoms may get better when you quit. You may also get sick less often. If you are pregnant, quitting smoking can help to lower your chances   of having a baby of low birth weight. What can I do to help me quit smoking? Talk with your doctor about what can help you quit smoking. Some things you can do (strategies) include:  Quitting smoking totally, instead of slowly cutting back how much you smoke over a period of time.  Going to in-person counseling. You are more likely to quit if you go to many counseling sessions.  Using resources and support systems, such as:  Online chats with a counselor.  Phone quitlines.  Printed self-help  materials.  Support groups or group counseling.  Text messaging programs.  Mobile phone apps or applications.  Taking medicines. Some of these medicines may have nicotine in them. If you are pregnant or breastfeeding, do not take any medicines to quit smoking unless your doctor says it is okay. Talk with your doctor about counseling or other things that can help you. Talk with your doctor about using more than one strategy at the same time, such as taking medicines while you are also going to in-person counseling. This can help make quitting easier. What things can I do to make it easier to quit? Quitting smoking might feel very hard at first, but there is a lot that you can do to make it easier. Take these steps:  Talk to your family and friends. Ask them to support and encourage you.  Call phone quitlines, reach out to support groups, or work with a counselor.  Ask people who smoke to not smoke around you.  Avoid places that make you want (trigger) to smoke, such as:  Bars.  Parties.  Smoke-break areas at work.  Spend time with people who do not smoke.  Lower the stress in your life. Stress can make you want to smoke. Try these things to help your stress:  Getting regular exercise.  Deep-breathing exercises.  Yoga.  Meditating.  Doing a body scan. To do this, close your eyes, focus on one area of your body at a time from head to toe, and notice which parts of your body are tense. Try to relax the muscles in those areas.  Download or buy apps on your mobile phone or tablet that can help you stick to your quit plan. There are many free apps, such as QuitGuide from the CDC (Centers for Disease Control and Prevention). You can find more support from smokefree.gov and other websites. This information is not intended to replace advice given to you by your health care provider. Make sure you discuss any questions you have with your health care provider. Document Released:  02/28/2009 Document Revised: 12/31/2015 Document Reviewed: 09/18/2014 Elsevier Interactive Patient Education  2017 Elsevier Inc.  

## 2016-04-22 NOTE — Telephone Encounter (Signed)
Sched appt 05/02/16; lab at 1:00 and NP at  2:15. Spoke to pt to inform him of appts.

## 2016-04-22 NOTE — Telephone Encounter (Signed)
Samples and prescription have been given to the pt for Incruse. Nothing further was needed.

## 2016-05-08 ENCOUNTER — Other Ambulatory Visit: Payer: Self-pay | Admitting: Cardiovascular Disease

## 2016-05-08 ENCOUNTER — Encounter: Payer: Self-pay | Admitting: Surgery

## 2016-05-08 DIAGNOSIS — I25700 Atherosclerosis of coronary artery bypass graft(s), unspecified, with unstable angina pectoris: Secondary | ICD-10-CM

## 2016-05-13 DIAGNOSIS — K4000 Bilateral inguinal hernia, with obstruction, without gangrene, not specified as recurrent: Secondary | ICD-10-CM | POA: Diagnosis not present

## 2016-05-25 ENCOUNTER — Ambulatory Visit (INDEPENDENT_AMBULATORY_CARE_PROVIDER_SITE_OTHER): Payer: Medicare Other | Admitting: Surgery

## 2016-05-25 ENCOUNTER — Encounter: Payer: Self-pay | Admitting: Surgery

## 2016-05-25 VITALS — BP 117/69 | HR 60 | Temp 97.5°F | Resp 16 | Ht 69.0 in | Wt 195.0 lb

## 2016-05-25 DIAGNOSIS — I70211 Atherosclerosis of native arteries of extremities with intermittent claudication, right leg: Secondary | ICD-10-CM | POA: Diagnosis not present

## 2016-05-25 NOTE — Progress Notes (Signed)
Vascular [facility]in Specialist of [facility]  Patient name: Brian Brock MRN: [id number] DOB: Jul 09, 1945 Sex: male  REASON FOR VISIT: Follow-up  HPI: Brian Brock is a 71 y.o. male who is status post right iliofemoral endarterectomy and patch angioplasty on 12/19/2015.  He was seen in early December for a bulge in his right groin.  Ultrasound evaluation was performed.  This was negative for a pseudoaneurysm.  He has subsequently seen another physician who was concerned about hernia.  He is scheduled to see general surgery this Monday.  He states that his walking has improved.  His claudication symptoms have resolved.  He does not have any open wounds.  Past Medical History:  Diagnosis Date  . Allergic rhinitis   . Anginal pain ([place]) 1997  . Arthritis    "lower back" (09/26/2015)  . Childhood asthma   . Chronic bronchitis ([place])    Yearly  . COPD (chronic obstructive pulmonary disease) ([place])   . Coronary artery disease    Multiple stents  . Dyslipidemia   . Dysrhythmia   . GERD (gastroesophageal reflux disease)   . History of hiatal hernia   . Hyperlipemia   . Hypertension   . Hypothyroidism   . Myocardial infarct 1997   "mild"  . Peripheral vascular disease ([place])    prior stenting    Family History  Problem Relation Age of Onset  . Coronary artery disease Father 64  . Heart attack Father   . Coronary artery disease Brother 52    CABG in 2000    SOCIAL HISTORY: Social History  Substance Use Topics  . Smoking status: Former Smoker    Packs/day: 0.50    Years: 56.00    Types: Cigarettes    Quit date: 03/25/2016  . Smokeless tobacco: Current User    Types: Chew     Comment: No smoking   . Alcohol use Yes     Comment: 09/26/2015 "varies; might have a few drinks q 2 months"    Allergies  Allergen Reactions  . Metoprolol Other (See Comments)    Fatigue and malaise  . Levofloxacin     Unknown reaction  . Meloxicam Other  (See Comments)    Achy, fatigue  . Metaxalone     Unknown reaction  . Niaspan [Niacin] Other (See Comments)    Unknown reaction  . Tricor [Fenofibrate] Other (See Comments)    Tired/weak  . Fish Oil Rash  . Sulfonamide Derivatives Rash    unknown reaction - bad rash    Current Outpatient Prescriptions  Medication Sig Dispense Refill  . aspirin EC 81 MG tablet Take 81 mg by mouth every evening.     . cetirizine (ZYRTEC) 10 MG tablet Take 10 mg by mouth daily.    . cholestyramine (QUESTRAN) 4 G packet Take 1 packet by mouth 2 (two) times daily with a meal. 60 each 11  . clopidogrel (PLAVIX) 75 MG tablet TAKE ONE TABLET BY MOUTH EVERY DAY 90 tablet 3  . co-enzyme Q-10 30 MG capsule Take 30 mg by mouth 3 (three) times daily.    . isosorbide mononitrate (IMDUR) 30 MG 24 hr tablet TAKE 1/2 TABLET BY MOUTH EVERY DAY 15 tablet 11  . Lactobacillus (FLORAJEN ACIDOPHILUS PO) Take by mouth daily.    [name] levothyroxine (SYNTHROID, LEVOTHROID) 25 MCG tablet Take 25 mcg by mouth daily before breakfast.     . losartan (COZAAR) 100 MG tablet TAKE ONE TABLET BY MOUTH EVERY DAY 30 tablet  11  . nebivolol (BYSTOLIC) 5 MG tablet Take 0.5 tablets (2.5 mg total) by mouth daily. 30 tablet 6  . NITROSTAT 0.4 MG SL tablet Place 1 tablet (0.4 mg total) under the tongue every 5 (five) minutes as needed for chest pain. 25 tablet 3  . pantoprazole (PROTONIX) 40 MG tablet Take 1 tablet by mouth 2 (two) times daily.     . Pomegranate, Punica granatum, (POMEGRANATE PO) Take by mouth.    . rosuvastatin (CRESTOR) 20 MG tablet Take 1 tablet (20 mg total) by mouth daily. 90 tablet 3  . umeclidinium bromide (INCRUSE ELLIPTA) 62.5 MCG/INH AEPB Inhale 1 puff into the lungs daily. 30 each 11  . zolpidem (AMBIEN) 10 MG tablet Take 5 mg by mouth at bedtime as needed for sleep.     No current facility-administered medications for this visit.     REVIEW OF SYSTEMS:  [X]  denotes positive finding, [ ]  denotes negative  finding Cardiac  Comments:  Chest pain or chest pressure:    Shortness of breath upon exertion:    Short of breath when lying flat:    Irregular heart rhythm:        Vascular    Pain in calf, thigh, or hip brought on by ambulation:    Pain in feet at night that wakes you up from your sleep:     Blood clot in your veins:    Leg swelling:         Pulmonary    Oxygen at home:    Productive cough:     Wheezing:         Neurologic    Sudden weakness in arms or legs:     Sudden numbness in arms or legs:     Sudden onset of difficulty speaking or slurred speech:    Temporary loss of vision in one eye:     Problems with dizziness:         Gastrointestinal    Blood in stool:     Vomited blood:         Genitourinary    Burning when urinating:     Blood in urine:        Psychiatric    Major depression:         Hematologic    Bleeding problems:    Problems with blood clotting too easily:        Skin    Rashes or ulcers:        Constitutional    Fever or chills:      PHYSICAL EXAM: Vitals:   05/25/16 1007  BP: 117/69  Pulse: 60  Resp: 16  Temp: 97.5 F (36.4 C)  TempSrc: Oral  SpO2: 95%  Weight: 195 lb (88.5 kg)  Height: 5\' 9"  (1.753 m)    GENERAL: The patient is a well-nourished male, in no acute distress. The vital signs are documented above. CARDIAC: There is a regular rate and rhythm.  VASCULAR: Palpable pedal pulses bilaterally PULMONARY: Non-labored respirations ABDOMEN: Soft and non-tender with normal pitched bowel sounds.  MUSCULOSKELETAL: There are no major deformities or cyanosis. NEUROLOGIC: No focal weakness or paresthesias are detected. SKIN: There are no ulcers or rashes noted. PSYCHIATRIC: The patient has a normal affect.  DATA:  None performed today  MEDICAL ISSUES: From a vascular standpoint, the patient is doing well.  His incision has completely healed.  I do feel some scar tissue in this area but do not feel a hernia.  I  did not check  him for a indirect hernia.  I did not feel any evidence of a femoral hernia.  He is being evaluated by general surgery next week.  He regularly sees Dr. Gwenlyn Found for his peripheral vascular issues, therefore I have not scheduled him for a return visit.  He will contact me if he has any issues.    Annamarie Major, MD [facility] [phone] Pager [phone]

## 2016-06-01 DIAGNOSIS — K4090 Unilateral inguinal hernia, without obstruction or gangrene, not specified as recurrent: Secondary | ICD-10-CM | POA: Diagnosis not present

## 2016-06-01 DIAGNOSIS — J449 Chronic obstructive pulmonary disease, unspecified: Secondary | ICD-10-CM | POA: Diagnosis not present

## 2016-06-01 DIAGNOSIS — Z9889 Other specified postprocedural states: Secondary | ICD-10-CM | POA: Diagnosis not present

## 2016-06-01 DIAGNOSIS — Z951 Presence of aortocoronary bypass graft: Secondary | ICD-10-CM | POA: Diagnosis not present

## 2016-06-01 DIAGNOSIS — Z7902 Long term (current) use of antithrombotics/antiplatelets: Secondary | ICD-10-CM | POA: Diagnosis not present

## 2016-06-01 DIAGNOSIS — Z9049 Acquired absence of other specified parts of digestive tract: Secondary | ICD-10-CM | POA: Diagnosis not present

## 2016-06-01 DIAGNOSIS — Z720 Tobacco use: Secondary | ICD-10-CM | POA: Diagnosis not present

## 2016-06-05 ENCOUNTER — Other Ambulatory Visit: Payer: Self-pay | Admitting: General Surgery

## 2016-06-05 DIAGNOSIS — R1909 Other intra-abdominal and pelvic swelling, mass and lump: Secondary | ICD-10-CM

## 2016-06-08 ENCOUNTER — Ambulatory Visit
Admission: RE | Admit: 2016-06-08 | Discharge: 2016-06-08 | Disposition: A | Discharge status: Home / Self Care | Payer: Medicare Other | Source: Ambulatory Visit | Attending: General Surgery | Admitting: General Surgery

## 2016-06-08 DIAGNOSIS — R1909 Other intra-abdominal and pelvic swelling, mass and lump: Secondary | ICD-10-CM

## 2016-07-02 DIAGNOSIS — Z9889 Other specified postprocedural states: Secondary | ICD-10-CM | POA: Diagnosis not present

## 2016-07-02 DIAGNOSIS — K4090 Unilateral inguinal hernia, without obstruction or gangrene, not specified as recurrent: Secondary | ICD-10-CM | POA: Diagnosis not present

## 2016-07-02 DIAGNOSIS — Z7902 Long term (current) use of antithrombotics/antiplatelets: Secondary | ICD-10-CM | POA: Diagnosis not present

## 2016-07-02 DIAGNOSIS — Z951 Presence of aortocoronary bypass graft: Secondary | ICD-10-CM | POA: Diagnosis not present

## 2016-07-02 DIAGNOSIS — J449 Chronic obstructive pulmonary disease, unspecified: Secondary | ICD-10-CM | POA: Diagnosis not present

## 2016-07-02 DIAGNOSIS — Z720 Tobacco use: Secondary | ICD-10-CM | POA: Diagnosis not present

## 2016-07-02 DIAGNOSIS — Z9049 Acquired absence of other specified parts of digestive tract: Secondary | ICD-10-CM | POA: Diagnosis not present

## 2016-07-03 ENCOUNTER — Telehealth: Payer: Self-pay | Admitting: *Deleted

## 2016-07-03 NOTE — Telephone Encounter (Signed)
Faxed surgical clearance to [place] [facility] for possible hernia surgery to be done by Dr Dalbert Batman.

## 2016-07-16 HISTORY — PX: HERNIA REPAIR: SHX51

## 2016-08-13 DIAGNOSIS — Z9181 History of falling: Secondary | ICD-10-CM | POA: Diagnosis not present

## 2016-08-13 DIAGNOSIS — J329 Chronic sinusitis, unspecified: Secondary | ICD-10-CM | POA: Diagnosis not present

## 2016-08-13 DIAGNOSIS — J40 Bronchitis, not specified as acute or chronic: Secondary | ICD-10-CM | POA: Diagnosis not present

## 2016-08-13 DIAGNOSIS — Z1389 Encounter for screening for other disorder: Secondary | ICD-10-CM | POA: Diagnosis not present

## 2016-08-27 ENCOUNTER — Other Ambulatory Visit: Payer: Self-pay | Admitting: Cardiovascular Disease

## 2016-08-27 DIAGNOSIS — I739 Peripheral vascular disease, unspecified: Secondary | ICD-10-CM

## 2016-08-31 DIAGNOSIS — R197 Diarrhea, unspecified: Secondary | ICD-10-CM | POA: Diagnosis not present

## 2016-08-31 DIAGNOSIS — Z951 Presence of aortocoronary bypass graft: Secondary | ICD-10-CM | POA: Diagnosis not present

## 2016-08-31 DIAGNOSIS — R1013 Epigastric pain: Secondary | ICD-10-CM | POA: Diagnosis not present

## 2016-08-31 DIAGNOSIS — K4090 Unilateral inguinal hernia, without obstruction or gangrene, not specified as recurrent: Secondary | ICD-10-CM | POA: Diagnosis not present

## 2016-08-31 DIAGNOSIS — Z720 Tobacco use: Secondary | ICD-10-CM | POA: Diagnosis not present

## 2016-08-31 DIAGNOSIS — Z7902 Long term (current) use of antithrombotics/antiplatelets: Secondary | ICD-10-CM | POA: Diagnosis not present

## 2016-08-31 DIAGNOSIS — J449 Chronic obstructive pulmonary disease, unspecified: Secondary | ICD-10-CM | POA: Diagnosis not present

## 2016-08-31 DIAGNOSIS — Z9049 Acquired absence of other specified parts of digestive tract: Secondary | ICD-10-CM | POA: Diagnosis not present

## 2016-09-01 DIAGNOSIS — L570 Actinic keratosis: Secondary | ICD-10-CM | POA: Diagnosis not present

## 2016-09-01 DIAGNOSIS — L578 Other skin changes due to chronic exposure to nonionizing radiation: Secondary | ICD-10-CM | POA: Diagnosis not present

## 2016-09-01 DIAGNOSIS — L820 Inflamed seborrheic keratosis: Secondary | ICD-10-CM | POA: Diagnosis not present

## 2016-09-01 DIAGNOSIS — L821 Other seborrheic keratosis: Secondary | ICD-10-CM | POA: Diagnosis not present

## 2016-09-01 DIAGNOSIS — D1801 Hemangioma of skin and subcutaneous tissue: Secondary | ICD-10-CM | POA: Diagnosis not present

## 2016-09-03 ENCOUNTER — Encounter (HOSPITAL_COMMUNITY): Payer: Self-pay | Admitting: *Deleted

## 2016-09-03 ENCOUNTER — Emergency Department (HOSPITAL_COMMUNITY): Payer: Medicare Other

## 2016-09-03 ENCOUNTER — Emergency Department (HOSPITAL_COMMUNITY)
Admission: EM | Admit: 2016-09-03 | Discharge: 2016-09-03 | Disposition: A | Discharge status: Home / Self Care | Payer: Medicare Other | Attending: Emergency Medicine | Admitting: Emergency Medicine

## 2016-09-03 DIAGNOSIS — Z79899 Other long term (current) drug therapy: Secondary | ICD-10-CM | POA: Insufficient documentation

## 2016-09-03 DIAGNOSIS — I252 Old myocardial infarction: Secondary | ICD-10-CM | POA: Diagnosis not present

## 2016-09-03 DIAGNOSIS — Z7982 Long term (current) use of aspirin: Secondary | ICD-10-CM | POA: Diagnosis not present

## 2016-09-03 DIAGNOSIS — Z955 Presence of coronary angioplasty implant and graft: Secondary | ICD-10-CM | POA: Insufficient documentation

## 2016-09-03 DIAGNOSIS — I2510 Atherosclerotic heart disease of native coronary artery without angina pectoris: Secondary | ICD-10-CM | POA: Diagnosis not present

## 2016-09-03 DIAGNOSIS — Z87891 Personal history of nicotine dependence: Secondary | ICD-10-CM | POA: Insufficient documentation

## 2016-09-03 DIAGNOSIS — I10 Essential (primary) hypertension: Secondary | ICD-10-CM | POA: Insufficient documentation

## 2016-09-03 DIAGNOSIS — J449 Chronic obstructive pulmonary disease, unspecified: Secondary | ICD-10-CM | POA: Insufficient documentation

## 2016-09-03 DIAGNOSIS — R1084 Generalized abdominal pain: Secondary | ICD-10-CM | POA: Diagnosis not present

## 2016-09-03 DIAGNOSIS — R109 Unspecified abdominal pain: Secondary | ICD-10-CM | POA: Diagnosis present

## 2016-09-03 DIAGNOSIS — E039 Hypothyroidism, unspecified: Secondary | ICD-10-CM | POA: Insufficient documentation

## 2016-09-03 DIAGNOSIS — Z951 Presence of aortocoronary bypass graft: Secondary | ICD-10-CM | POA: Diagnosis not present

## 2016-09-03 DIAGNOSIS — K5730 Diverticulosis of large intestine without perforation or abscess without bleeding: Secondary | ICD-10-CM | POA: Diagnosis not present

## 2016-09-03 LAB — URINALYSIS, ROUTINE W REFLEX MICROSCOPIC
Bilirubin Urine: NEGATIVE
Glucose, UA: NEGATIVE mg/dL
Hgb urine dipstick: NEGATIVE
Ketones, ur: NEGATIVE mg/dL
Leukocytes, UA: NEGATIVE
Nitrite: NEGATIVE
Protein, ur: NEGATIVE mg/dL
Specific Gravity, Urine: 1.02 (ref 1.005–1.030)
pH: 5 (ref 5.0–8.0)

## 2016-09-03 LAB — CBC WITH DIFFERENTIAL/PLATELET
Basophils Absolute: 0 10*3/uL (ref 0.0–0.1)
Basophils Relative: 0 %
Eosinophils Absolute: 0.2 10*3/uL (ref 0.0–0.7)
Eosinophils Relative: 2 %
HCT: 41.8 % (ref 39.0–52.0)
Hemoglobin: 14 g/dL (ref 13.0–17.0)
Lymphocytes Relative: 36 %
Lymphs Abs: 4 10*3/uL (ref 0.7–4.0)
MCH: 28.7 pg (ref 26.0–34.0)
MCHC: 33.5 g/dL (ref 30.0–36.0)
MCV: 85.7 fL (ref 78.0–100.0)
Monocytes Absolute: 0.8 10*3/uL (ref 0.1–1.0)
Monocytes Relative: 7 %
Neutro Abs: 5.9 10*3/uL (ref 1.7–7.7)
Neutrophils Relative %: 55 %
Platelets: 236 10*3/uL (ref 150–400)
RBC: 4.88 MIL/uL (ref 4.22–5.81)
RDW: 13 % (ref 11.5–15.5)
WBC: 10.9 10*3/uL — ABNORMAL HIGH (ref 4.0–10.5)

## 2016-09-03 LAB — COMPREHENSIVE METABOLIC PANEL
ALT: 17 U/L (ref 17–63)
AST: 25 U/L (ref 15–41)
Albumin: 3.9 g/dL (ref 3.5–5.0)
Alkaline Phosphatase: 76 U/L (ref 38–126)
Anion gap: 12 (ref 5–15)
BUN: 19 mg/dL (ref 6–20)
CO2: 22 mmol/L (ref 22–32)
Calcium: 9 mg/dL (ref 8.9–10.3)
Chloride: 105 mmol/L (ref 101–111)
Creatinine, Ser: 1.28 mg/dL — ABNORMAL HIGH (ref 0.61–1.24)
GFR calc Af Amer: >60 mL/min (ref >60)
GFR calc non Af Amer: 55 mL/min — ABNORMAL LOW (ref >60)
Glucose, Bld: 107 mg/dL — ABNORMAL HIGH (ref 65–99)
Potassium: 3.9 mmol/L (ref 3.5–5.1)
Sodium: 139 mmol/L (ref 135–145)
Total Bilirubin: 0.5 mg/dL (ref 0.3–1.2)
Total Protein: 6.6 g/dL (ref 6.5–8.1)

## 2016-09-03 MED ORDER — DICYCLOMINE HCL 10 MG PO CAPS: 10.0000 mg | ORAL_CAPSULE | Freq: Two times a day (BID) | ORAL | 0 refills | Status: DC | PRN

## 2016-09-03 MED ORDER — IOPAMIDOL (ISOVUE-300) INJECTION 61%
INTRAVENOUS | Status: AC
  Administered 2016-09-03: 100 mL
  Filled 2016-09-03: qty 100

## 2016-09-03 NOTE — ED Notes (Signed)
Pt has a known rt inguinal hernia  He has had pain in that area since this am and he thinks it has ruptured becaused it has decreased in size

## 2016-09-03 NOTE — ED Notes (Signed)
He too tylenol 1500 today  That did not help the pain

## 2016-09-03 NOTE — ED Notes (Signed)
ED Provider at bedside. 

## 2016-09-03 NOTE — ED Triage Notes (Signed)
Patient presents with c/o lower abd pain that has gotten worse.  Has been seen for hernia and told if the pain got worse to come to the ED.  States has not been voiding like usual and does not have an appetite

## 2016-09-03 NOTE — ED Provider Notes (Signed)
[facility] DEPT Provider Note   CSN: [id number] Arrival date & time: 09/03/16  0013   By signing my name below, I, Delton Prairie, attest that this documentation has been prepared under the direction and in the presence of Everlene Balls, MD  Electronically Signed: Delton Prairie, ED Scribe. 09/03/16. 2:57 AM.   History   Chief Complaint Chief Complaint  Patient presents with  . Abdominal Pain    HPI Comments:  Brian Brock is a 71 y.o. male, with a PMHx of COPD, CAD, and HTN, who presents to the Emergency Department complaining of acute onset, moderate abdominal pain x 2 days. Pt states he was told he has an hernia about 3 weeks ago and was advised to visit the ED if his abdominal pain worsened due to his hernia. He notes the hernia has decreased in size x yesterday. He also reports resolved diarrhea. No alleviating or aggravating factors noted. Pt denies any other associated symptoms. No other complaints noted at this time.   The history is provided by the patient. No language interpreter was used.    Past Medical History:  Diagnosis Date  . Allergic rhinitis   . Anginal pain ([place]) 1997  . Arthritis    "lower back" (09/26/2015)  . Childhood asthma   . Chronic bronchitis ([place])    Yearly  . COPD (chronic obstructive pulmonary disease) ([place])   . Coronary artery disease    Multiple stents  . Dyslipidemia   . Dysrhythmia   . GERD (gastroesophageal reflux disease)   . History of hiatal hernia   . Hyperlipemia   . Hypertension   . Hypothyroidism   . Myocardial infarct ([place]) 1997   "mild"  . Peripheral vascular disease ([place])    prior stenting    Patient Active Problem List   Diagnosis Date Noted  . PAD (peripheral artery disease) ([place]) 12/19/2015  . Claudication ([place]) 09/11/2015  . Obstructive chronic bronchitis without exacerbation ([place]) 03/09/2015  . CAD (coronary artery disease) of artery bypass graft 07/24/2014  . Coronary artery disease involving coronary bypass  graft with other forms of angina pectoris   . Cardiovascular stress test abnormal 07/17/2014  . Hyperlipidemia LDL goal <70 02/15/2014  . Palpitations 02/15/2014  . Metabolic syndrome 14/48/1856  . HTN (hypertension) 04/15/2013  . Preoperative clearance 04/15/2013  . Bradycardia 11/04/2012  . Dizziness 11/04/2012  . PAF (paroxysmal atrial fibrillation) ([place]) 11/04/2012  . Hypotension- B/P 31-49- systolic 70/26/3785  . CAD (coronary artery disease), native coronary artery - Severe Ostial & Proximal LAD ~90%; Ostial Circumflex ostial ~60%; Plan CABG 09/10/2012  . Elevated LFTs 09/06/2012  .  EKG abnormality- transient TWI 09/06/2012  . PVD- Lt SFA stent '04, Rt SFA stent '08 09/06/2012  . CAD- CABG X 17 August 2012 09/05/2012  . Unstable angina ([place]) 09/05/2012  . DYSPNEA 06/26/2009  . Hyperlipidemia 05/23/2009  . Tobacco use 05/23/2009  . Bronchitis, chronic obstructive, with exacerbation ([place]) 05/23/2009    Past Surgical History:  Procedure Laterality Date  . CARDIAC CATHETERIZATION  2004   had remote tandem stents in 1997 and 1998 for LAD lesions, RCA stent in 1997 , midprto=ion, and a proximal RCA  SENT IN 1999, all DES; some were IVIS  guide.  [name] CARDIAC CATHETERIZATION  09/07/2012   demonstrated severe 2- vessel disease,patent LAD,patient circ and patent right stents, but 90% ostial LAD whic was new.  . CAROTID DOPPLER  100/24/11   RGT BULB and PROXIMAL ICA 0-49%; LFT. ICA  0-49 ; RGT  SUBCLAVIAN ARTERY < 50% ; LFT SUBCLAVIAN normal patency; RGT  and LFT  VERTEBRAL ARTERIES  . CORONARY ANGIOPLASTY    . CORONARY ANGIOPLASTY WITH STENT PLACEMENT  '97, '98, '04, '06, '08, '10   seven cardiac stents  . CORONARY ARTERY BYPASS GRAFT N/A 09/12/2012   Procedure: OFF PUMP CORONARY ARTERY BYPASS GRAFTING (CABG);  Surgeon: Melrose Nakayama, MD;  Location: [facility];  Service: Open Heart Surgery;  Laterality: N/A;  Times two using left internal mammary artery and endoscopically harvested right  saphenous vein  ;LIMA-LAD and SVG -OM1  . DOPPLER ECHOCARDIOGRAPHY  11/23/2012,09/30/2010    LV EF 50-55%----09/30/2010-LV EF = >55%  . ENDARTERECTOMY FEMORAL Right 12/19/2015   Procedure: ENDARTERECTOMY RIGHT FEMORAL ARTERY;  Surgeon: Serafina Mitchell, MD;  Location: [facility];  Service: Vascular;  Laterality: Right;  . event monitor  11/04/2012-11/17/2012   pt denies this hx on 07/24/2014  . FEMORAL ARTERY STENT Bilateral 7/06  8/08   Lt SFA '06, Rt SFA 8/08  . HERNIA REPAIR  10/2008   "repaired w/gallbladder OR"  . LAPAROSCOPIC CHOLECYSTECTOMY  June 2010  . LEFT HEART CATHETERIZATION WITH CORONARY ANGIOGRAM N/A 09/07/2012   Procedure: LEFT HEART CATHETERIZATION WITH CORONARY ANGIOGRAM;  Surgeon: Troy Sine, MD;  Location: [facility] CATH LAB;  Service: Cardiovascular;  Laterality: N/A;  . LEFT HEART CATHETERIZATION WITH CORONARY/GRAFT ANGIOGRAM N/A 07/24/2014   Procedure: LEFT HEART CATHETERIZATION WITH Beatrix Fetters;  Surgeon: Troy Sine, MD; LAD 70%, 50% ISR, mLAD 99%, CFX 30%, patent stent, RCA patent, LIMA-LAD occluded, SVG-OM 1 patent, EF 50-55%  . LEXISCAN STRESS TEST  09/06/2012  . LOWER ARTERIAL DOPPLER  03/25/2012   FGT CIA 0-49%; RGT CFA AND SFA AT STENT 0-49%; RGT DISTAL SFA = <50%; LFT. EIA  50-69%; LFT. CFA 0-49% ;;LFT. SFA  AT STENT NORMAL PATENCY  . NM MYOVIEW LTD  05/06/2012   EF -STUDY NOT GATED, NORMAL STRESS NUCLEAR ,NORMAL LV FUNCTION  . PATCH ANGIOPLASTY Right 12/19/2015   Procedure: PATCH ANGIOPLASTY RIGHT FEMORAL ARTERY;  Surgeon: Serafina Mitchell, MD;  Location: [facility];  Service: Vascular;  Laterality: Right;  . PERCUTANEOUS CORONARY STENT INTERVENTION (PCI-S) Right 07/24/2014   Procedure: PERCUTANEOUS CORONARY STENT INTERVENTION (PCI-S);  Surgeon: Troy Sine, MD; ostial LAD PTCA with angioscope scoring balloon, 3.0 x 34 mm Resolute DES extending from the ostium to the mid LAD, covering all significant stenoses   . PERIPHERAL VASCULAR CATHETERIZATION N/A 09/26/2015    Procedure: Abdominal Aortogram w/Lower Extremity;  Surgeon: Lorretta Harp, MD;  Location: [facility] CV LAB;  Service: Cardiovascular;  Laterality: N/A;  . PERIPHERAL VASCULAR CATHETERIZATION Right 02/20/2016   Procedure: Peripheral Vascular Atherectomy;  Surgeon: Lorretta Harp, MD;  Location: [facility] CV LAB;  Service: Cardiovascular;  Laterality: Right;  SFA  . PERIPHERAL VASCULAR CATHETERIZATION Right 02/20/2016   Procedure: Peripheral Vascular Balloon Angioplasty;  Surgeon: Lorretta Harp, MD;  Location: [facility] CV LAB;  Service: Cardiovascular;  Laterality: Right;  SFA  . TONSILLECTOMY  1950's       Home Medications    Prior to Admission medications   Medication Sig Start Date End Date Taking? Authorizing Provider  aspirin EC 81 MG tablet Take 81 mg by mouth every evening.     Historical Provider, MD  cetirizine (ZYRTEC) 10 MG tablet Take 10 mg by mouth daily.    Historical Provider, MD  cholestyramine Lucrezia Starch) 4 G packet Take 1 packet by mouth 2 (two) times daily with a meal.  10/20/12   Terance Ice, MD  clopidogrel (PLAVIX) 75 MG tablet TAKE ONE TABLET BY MOUTH EVERY DAY 05/08/16   Lorretta Harp, MD  co-enzyme Q-10 30 MG capsule Take 30 mg by mouth 3 (three) times daily.    Historical Provider, MD  isosorbide mononitrate (IMDUR) 30 MG 24 hr tablet TAKE 1/2 TABLET BY MOUTH EVERY DAY 03/10/16   Lorretta Harp, MD  Lactobacillus [facility] ACIDOPHILUS PO) Take by mouth daily.    Historical Provider, MD  levothyroxine (SYNTHROID, LEVOTHROID) 25 MCG tablet Take 25 mcg by mouth daily before breakfast.  07/31/14   Historical Provider, MD  losartan (COZAAR) 100 MG tablet TAKE ONE TABLET BY MOUTH EVERY DAY 04/07/16   Troy Sine, MD  nebivolol (BYSTOLIC) 5 MG tablet Take 0.5 tablets (2.5 mg total) by mouth daily. 09/27/15   Brittainy M Simmons, PA-C  NITROSTAT 0.4 MG SL tablet Place 1 tablet (0.4 mg total) under the tongue every 5 (five) minutes as needed for chest  pain. 09/11/15   Brett Canales, PA-C  pantoprazole (PROTONIX) 40 MG tablet Take 1 tablet by mouth 2 (two) times daily.  02/08/14   Historical Provider, MD  Pomegranate, [place] granatum, (POMEGRANATE PO) Take by mouth.    Historical Provider, MD  rosuvastatin (CRESTOR) 20 MG tablet Take 1 tablet (20 mg total) by mouth daily. 11/06/15   Troy Sine, MD  umeclidinium bromide (INCRUSE ELLIPTA) 62.5 MCG/INH AEPB Inhale 1 puff into the lungs daily. 04/22/16   Deneise Lever, MD  zolpidem (AMBIEN) 10 MG tablet Take 5 mg by mouth at bedtime as needed for sleep.    Historical Provider, MD    Family History Family History  Problem Relation Age of Onset  . Coronary artery disease Father 45  . Heart attack Father   . Coronary artery disease Brother 54    CABG in 2000    Social History Social History  Substance Use Topics  . Smoking status: Former Smoker    Packs/day: 0.50    Years: 56.00    Types: Cigarettes    Quit date: 03/25/2016  . Smokeless tobacco: Current User    Types: Chew     Comment: No smoking   . Alcohol use Yes     Comment: 09/26/2015 "varies; might have a few drinks q 2 months"     Allergies   Metoprolol; Levofloxacin; Meloxicam; Metaxalone; Niaspan [niacin]; Tricor [fenofibrate]; Fish oil; and Sulfonamide derivatives   Review of Systems Review of Systems All other systems reviewed and are negative for acute change except as noted in the HPI.  Physical Exam Updated Vital Signs BP (!) 164/78 (BP Location: Right Arm)   Pulse (!) 58   Temp 97.8 F (36.6 C) (Oral)   Resp 20   Ht 5\' 9"  (1.753 m)   Wt 193 lb (87.5 kg)   SpO2 97%   BMI 28.50 kg/m   Physical Exam  Constitutional: He is oriented to person, place, and time. Vital signs are normal. He appears well-developed and well-nourished.  Non-toxic appearance. He does not appear ill. No distress.  HENT:  Head: Normocephalic and atraumatic.  Nose: Nose normal.  Mouth/Throat: Oropharynx is clear and moist. No  oropharyngeal exudate.  Eyes: Conjunctivae and EOM are normal. Pupils are equal, round, and reactive to light. No scleral icterus.  Neck: Normal range of motion. Neck supple. No tracheal deviation, no edema, no erythema and normal range of motion present. No thyroid mass and no thyromegaly present.  Cardiovascular: Normal rate, regular rhythm, S1 normal, S2 normal, normal heart sounds, intact distal pulses and normal pulses.  Exam reveals no gallop and no friction rub.   No murmur heard. Pulmonary/Chest: Effort normal and breath sounds normal. No respiratory distress. He has no wheezes. He has no rhonchi. He has no rales.  Abdominal: Soft. Normal appearance and bowel sounds are normal. He exhibits no distension, no ascites and no mass. There is no hepatosplenomegaly. There is no tenderness. There is no rebound, no guarding and no CVA tenderness.  No right sided inguinal hernia palpated.    Musculoskeletal: Normal range of motion. He exhibits no edema or tenderness.  Lymphadenopathy:    He has no cervical adenopathy.  Neurological: He is alert and oriented to person, place, and time. He has normal strength. No cranial nerve deficit or sensory deficit.  Skin: Skin is warm, dry and intact. No petechiae and no rash noted. He is not diaphoretic. No erythema. No pallor.  Nursing note and vitals reviewed.    ED Treatments / Results  DIAGNOSTIC STUDIES:  Oxygen Saturation is 97% on RA, normal by my interpretation.    COORDINATION OF CARE:  2:40 AM Discussed treatment plan with pt at bedside and pt agreed to plan.  Labs (all labs ordered are listed, but only abnormal results are displayed) Labs Reviewed  CBC WITH DIFFERENTIAL/PLATELET - Abnormal; Notable for the following:       Result Value   WBC 10.9 (*)    All other components within normal limits  COMPREHENSIVE METABOLIC PANEL - Abnormal; Notable for the following:    Glucose, Bld 107 (*)    Creatinine, Ser 1.28 (*)    GFR calc non Af  Amer 55 (*)    All other components within normal limits  URINALYSIS, ROUTINE W REFLEX MICROSCOPIC    EKG  EKG Interpretation None       Radiology No results found.  Procedures Procedures (including critical care time)  Medications Ordered in ED Medications - No data to display   Initial Impression / Assessment and Plan / ED Course  I have reviewed the triage vital signs and the nursing notes.  Pertinent labs & imaging results that were available during my care of the patient were reviewed by me and considered in my medical decision making (see chart for details).     Patient presents to the ED for abdominal pain. He points to periumbilical area but is concerned it may be related to his R inguinal hernia. I reassured the patient that his hernia is fine, but will still obtain CT to search for an alternate cause of his periumbilical abdominal pain.  Labs are normal. He is not requesting anything for pain.  5:30 AM CT reveal possible fat necrosis as the cause of his pain.  Will give bentyl to use as needed.  He was advised to call his surgeon for close follow up and he demonstrates good understanding of the plan. He appears well and In NAD.  VS remain normal. Patient safe for Dc.  Final Clinical Impressions(s) / ED Diagnoses   Final diagnoses:  None    New Prescriptions New Prescriptions   No medications on file    I personally performed the services described in this documentation, which was scribed in my presence. The recorded information has been reviewed and is accurate.      Everlene Balls, MD 09/03/16 0530

## 2016-09-09 ENCOUNTER — Other Ambulatory Visit: Payer: Self-pay | Admitting: Cardiovascular Disease

## 2016-09-09 ENCOUNTER — Ambulatory Visit (HOSPITAL_COMMUNITY)
Admission: RE | Admit: 2016-09-09 | Discharge: 2016-09-09 | Disposition: A | Discharge status: Home / Self Care | Payer: Medicare Other | Source: Ambulatory Visit | Attending: Cardiovascular Disease | Admitting: Cardiovascular Disease

## 2016-09-09 DIAGNOSIS — I350 Nonrheumatic aortic (valve) stenosis: Secondary | ICD-10-CM | POA: Diagnosis not present

## 2016-09-09 DIAGNOSIS — I700 Atherosclerosis of aorta: Secondary | ICD-10-CM | POA: Insufficient documentation

## 2016-09-09 DIAGNOSIS — I739 Peripheral vascular disease, unspecified: Secondary | ICD-10-CM

## 2016-09-09 DIAGNOSIS — I70201 Unspecified atherosclerosis of native arteries of extremities, right leg: Secondary | ICD-10-CM | POA: Diagnosis not present

## 2016-09-09 DIAGNOSIS — Z95828 Presence of other vascular implants and grafts: Secondary | ICD-10-CM | POA: Diagnosis not present

## 2016-09-09 DIAGNOSIS — I771 Stricture of artery: Secondary | ICD-10-CM | POA: Insufficient documentation

## 2016-09-16 ENCOUNTER — Encounter: Payer: Self-pay | Admitting: Cardiovascular Disease

## 2016-09-16 ENCOUNTER — Ambulatory Visit (INDEPENDENT_AMBULATORY_CARE_PROVIDER_SITE_OTHER): Payer: Medicare Other | Admitting: Cardiovascular Disease

## 2016-09-16 VITALS — BP 121/64 | HR 58 | Ht 69.0 in | Wt 199.6 lb

## 2016-09-16 DIAGNOSIS — I2510 Atherosclerotic heart disease of native coronary artery without angina pectoris: Secondary | ICD-10-CM | POA: Diagnosis not present

## 2016-09-16 DIAGNOSIS — I739 Peripheral vascular disease, unspecified: Secondary | ICD-10-CM

## 2016-09-16 DIAGNOSIS — I70211 Atherosclerosis of native arteries of extremities with intermittent claudication, right leg: Secondary | ICD-10-CM | POA: Diagnosis not present

## 2016-09-16 NOTE — Progress Notes (Signed)
09/16/2016 Brian Brock   Nov 22, 1945  [id number]  Primary Physician [name], MD Primary Cardiologist: Lorretta Harp MD Renae Gloss  HPI:   Brian Brock is a 71 year old gentleman with a history of CAD status post bypass grafting in the past and subsequent LAD intervention by Dr. Claiborne Billings. I last saw him in the office 03/10/16. He has had bilateral SFA stents placed as well.[name] He's had right greater than left leg; occasional with recent Doppler studies suggest a high-frequency signal in his mid right SFA. He underwent peripheral angiography and intervention by myself 09/26/15 which time I stented his left external iliac artery. I did demonstrate 5060% segmental stenosis of his mid left SFA with two-vessel runoff. He had a 90% calcified exophytic plaque in his right common femoral artery, 90% "in-stent restenosis within his replaced right SFA stent for any percent focal stenosis just proximal to the stent, 95% calcified stenosis distal to the stent with two-vessel runoff. He essentially underwent right common femoral endarterectomy and patch angioplasty by Dr. Trula Slade A/4/17 which moderately improved his claudication. He underwent staged right SFA diamondback orbital rotational atherectomy, drug eluting balloon angioplasty of "in-stent restenosis within the mid right SFA nitinol stent as well as high-grade calcified fairly focal mid to distal right SFA stenosis beyond the stent. He had an excellent angiographic, clinical and ultrasound result. His right ABI improved from 0.54 up to 0.93 and his claudication resolved. His most recent Dopplers performed 09/09/16 revealed normal ABIs bilaterally although his aortoiliac Dopplers did show some progression of disease.    Current Outpatient Prescriptions  Medication Sig Dispense Refill  . aspirin EC 81 MG tablet Take 81 mg by mouth daily.     . cetirizine (ZYRTEC) 10 MG tablet Take 10 mg by mouth daily as needed for allergies.       . cholestyramine (QUESTRAN) 4 G packet Take 1 packet by mouth 2 (two) times daily with a meal. 60 each 11  . clopidogrel (PLAVIX) 75 MG tablet TAKE ONE TABLET BY MOUTH EVERY DAY 90 tablet 3  . dicyclomine (BENTYL) 10 MG capsule Take 1 capsule (10 mg total) by mouth 2 (two) times daily as needed for spasms (abdominal pain). 10 capsule 0  . isosorbide mononitrate (IMDUR) 30 MG 24 hr tablet TAKE 1/2 TABLET BY MOUTH EVERY DAY 15 tablet 11  . levothyroxine (SYNTHROID, LEVOTHROID) 25 MCG tablet Take 25 mcg by mouth daily before breakfast.     . losartan (COZAAR) 100 MG tablet TAKE ONE TABLET BY MOUTH EVERY DAY 30 tablet 11  . nebivolol (BYSTOLIC) 5 MG tablet Take 0.5 tablets (2.5 mg total) by mouth daily. 30 tablet 6  . NITROSTAT 0.4 MG SL tablet Place 1 tablet (0.4 mg total) under the tongue every 5 (five) minutes as needed for chest pain. 25 tablet 3  . pantoprazole (PROTONIX) 40 MG tablet Take 40 mg by mouth 2 (two) times daily.     . rosuvastatin (CRESTOR) 20 MG tablet Take 1 tablet (20 mg total) by mouth daily. 90 tablet 3  . umeclidinium bromide (INCRUSE ELLIPTA) 62.5 MCG/INH AEPB Inhale 1 puff into the lungs daily. 30 each 11   No current facility-administered medications for this visit.     Allergies  Allergen Reactions  . Metoprolol Other (See Comments)    Fatigue and malaise  . Levofloxacin     Unknown reaction  . Meloxicam Other (See Comments)    Achy, fatigue  . Metaxalone  Unknown reaction  . Niaspan [Niacin] Other (See Comments)    Unknown reaction  . Tricor [Fenofibrate] Other (See Comments)    Tired/weak  . Fish Oil Rash  . Sulfonamide Derivatives Rash    unknown reaction - bad rash    Social History   Social History  . Marital status: Married    Spouse name: N/A  . Number of children: N/A  . Years of education: N/A   Occupational History  . Not on file.   Social History Main Topics  . Smoking status: Former Smoker    Packs/day: 0.50    Years: 56.00     Types: Cigarettes    Quit date: 03/25/2016  . Smokeless tobacco: Current User    Types: Chew     Comment: No smoking   . Alcohol use Yes     Comment: 09/26/2015 "varies; might have a few drinks q 2 months"  . Drug use: No  . Sexual activity: Not Currently   Other Topics Concern  . Not on file   Social History Narrative  . No narrative on file     Review of Systems: General: negative for chills, fever, night sweats or weight changes.  Cardiovascular: negative for chest pain, dyspnea on exertion, edema, orthopnea, palpitations, paroxysmal nocturnal dyspnea or shortness of breath Dermatological: negative for rash Respiratory: negative for cough or wheezing Urologic: negative for hematuria Abdominal: negative for nausea, vomiting, diarrhea, bright red blood per rectum, melena, or hematemesis Neurologic: negative for visual changes, syncope, or dizziness All other systems reviewed and are otherwise negative except as noted above.    Blood pressure 121/64, pulse (!) 58, height 5\' 9"  (1.753 m), weight 199 lb 9.6 oz (90.5 kg), SpO2 96 %.  General appearance: alert and no distress Neck: no adenopathy, no carotid bruit, no JVD, supple, symmetrical, trachea midline and thyroid not enlarged, symmetric, no tenderness/mass/nodules Lungs: clear to auscultation bilaterally Heart: regular rate and rhythm, S1, S2 normal, no murmur, click, rub or gallop Extremities: extremities normal, atraumatic, no cyanosis or edema  EKG not performed today  ASSESSMENT AND PLAN:   Hyperlipidemia History of hyperlipidemia on statin therapy followed by his PCP  CAD- CABG X 17 August 2012 History of CAD status post bypass grafting and subsequent LAD intervention by Dr. Claiborne Billings.  PVD- Lt SFA stent '04, Rt SFA stent '08 History of peripheral arterial disease status post right SFA stent in 2008. Angiogram from 09/26/15 and performed stenting of his left external iliac and demonstrated an 80-90% calcified eccentric  plaque in his right common femoral artery as well as a 90% "in-stent restenosis within the mid right SFA stent the 95% calcified lesion just beyond 3 vessel runoff. Chest weak underwent right common femoral endarterectomy and patch angioplasty by Dr. problem. I came back and intervened on his right SFA. His post procedure Dopplers showed marked improvement. His most recent Dopplers performed/25/18 revealed a right ABI 1.1 with a widely patent SFA stent. He does complain of some occasional occasional hip pain and Dopplers did show progression of disease in his iliac arteries. We will continue to follow this noninvasively.      Lorretta Harp MD FACP,FACC,FAHA, [facility] 09/16/2016 3:15 PM

## 2016-09-16 NOTE — Assessment & Plan Note (Signed)
History of peripheral arterial disease status post right SFA stent in 2008. Angiogram from 09/26/15 and performed stenting of his left external iliac and demonstrated an 80-90% calcified eccentric plaque in his right common femoral artery as well as a 90% "in-stent restenosis within the mid right SFA stent the 95% calcified lesion just beyond 3 vessel runoff. Chest weak underwent right common femoral endarterectomy and patch angioplasty by Dr. problem. I came back and intervened on his right SFA. His post procedure Dopplers showed marked improvement. His most recent Dopplers performed/25/18 revealed a right ABI 1.1 with a widely patent SFA stent. He does complain of some occasional occasional hip pain and Dopplers did show progression of disease in his iliac arteries. We will continue to follow this noninvasively.

## 2016-09-16 NOTE — Assessment & Plan Note (Signed)
History of hyperlipidemia on statin therapy followed by his PCP 

## 2016-09-16 NOTE — Assessment & Plan Note (Signed)
History of CAD status post bypass grafting and subsequent LAD intervention by Dr. Claiborne Billings.

## 2016-09-16 NOTE — Patient Instructions (Signed)
Medication Instructions: Your physician recommends that you continue on your current medications as directed. Please refer to the Current Medication list given to you today.   Testing/Procedures: Your physician has requested that you have a lower extremity arterial duplex. During this test, ultrasound is used to evaluate arterial blood flow in the legs. Allow one hour for this exam. There are no restrictions or special instructions.  Your physician has requested that you have an aorta and iliac duplex. During this test, an ultrasound is used to evaluate blood flow to the aorta and iliac arteries. Allow one hour for this exam. Do not eat after midnight the day before and avoid carbonated beverages.   Your physician has requested that you have an ankle brachial index (ABI). During this test an ultrasound and blood pressure cuff are used to evaluate the arteries that supply the arms and legs with blood. Allow thirty minutes for this exam. There are no restrictions or special instructions.  Follow-Up: Your physician wants you to follow-up in: 6 months with Dr. Gwenlyn Found after testing  You will receive a reminder letter in the mail two months in advance. If you don't receive a letter, please call our office to schedule the follow-up appointment.  If you need a refill on your cardiac medications before your next appointment, please call your pharmacy.

## 2016-10-13 DIAGNOSIS — R1013 Epigastric pain: Secondary | ICD-10-CM | POA: Diagnosis not present

## 2016-10-13 DIAGNOSIS — K219 Gastro-esophageal reflux disease without esophagitis: Secondary | ICD-10-CM | POA: Diagnosis not present

## 2016-10-13 DIAGNOSIS — Z86010 Personal history of colonic polyps: Secondary | ICD-10-CM | POA: Diagnosis not present

## 2016-10-13 DIAGNOSIS — K4090 Unilateral inguinal hernia, without obstruction or gangrene, not specified as recurrent: Secondary | ICD-10-CM | POA: Diagnosis not present

## 2016-10-20 ENCOUNTER — Ambulatory Visit (INDEPENDENT_AMBULATORY_CARE_PROVIDER_SITE_OTHER): Payer: Medicare Other | Admitting: Cardiovascular Disease

## 2016-10-20 ENCOUNTER — Encounter: Payer: Self-pay | Admitting: *Deleted

## 2016-10-20 ENCOUNTER — Encounter: Payer: Self-pay | Admitting: Cardiovascular Disease

## 2016-10-20 ENCOUNTER — Telehealth (HOSPITAL_COMMUNITY): Payer: Self-pay

## 2016-10-20 VITALS — BP 138/72 | HR 55 | Ht 69.0 in | Wt 197.0 lb

## 2016-10-20 DIAGNOSIS — K4090 Unilateral inguinal hernia, without obstruction or gangrene, not specified as recurrent: Secondary | ICD-10-CM | POA: Diagnosis not present

## 2016-10-20 DIAGNOSIS — Z79899 Other long term (current) drug therapy: Secondary | ICD-10-CM | POA: Diagnosis not present

## 2016-10-20 DIAGNOSIS — I70211 Atherosclerosis of native arteries of extremities with intermittent claudication, right leg: Secondary | ICD-10-CM | POA: Diagnosis not present

## 2016-10-20 DIAGNOSIS — I739 Peripheral vascular disease, unspecified: Secondary | ICD-10-CM

## 2016-10-20 DIAGNOSIS — E785 Hyperlipidemia, unspecified: Secondary | ICD-10-CM

## 2016-10-20 DIAGNOSIS — Z01810 Encounter for preprocedural cardiovascular examination: Secondary | ICD-10-CM

## 2016-10-20 DIAGNOSIS — I2510 Atherosclerotic heart disease of native coronary artery without angina pectoris: Secondary | ICD-10-CM | POA: Diagnosis not present

## 2016-10-20 NOTE — Progress Notes (Signed)
Patient ID: Brian Brock, male   DOB: 12-09-1945, 71 y.o.   MRN: [id number]     HPI: Brian Brock is a 71 y.o. male who is a former patient of Dr. Terance Ice.  He presents for an 8 month follow-up cardiology evaluation prior to undergoing endoscopy/colonoscopy and right in the inguinal hernia surgery.  Brian Brock has known CAD, hypertension, hyperlipidemia, hypothyroidism, peripheral vascular disease,probable metabolic syndrome and ongoing tobacco use. He has undergone prior percutaneous coronary interventions with stenting to his LAD and RCA  in 1997, 1998, and in 2004 to his proximal left circumflex coronary artery. He also has established PVD and underwent stenting to his left SFA in 2004 and right SFA in 2008. There is a history of tobacco use, hyperlipidemia and in April 2014 he presented to [facility] with chest pain and T wave abnormalities.  A nuclear perfusion study demonstrated reversible ischemia in the apical segment of the anterior wall. He underwent repeat cardiac catheterization on 09/07/2012 by me and was found to have preserved LV function with minimal focal region of mild inferior hypocontractility. He had significant progression of CAD with ostial 90% eccentric stenosis of the LAD extending into the distal left main proximal to a previously placed proximal LAD stent. There also is 90% stenosis of the LAD after the stented segment. The circumflex vessel is 60% ostial stenosis followed by 60% proximal stenosis prior to the previously placed stent. The RCA stent was patent.  CABG surgery was recommended and this was done by Dr. Roxan Hockey on 09/12/2012 with an off-pump LIMA to LAD and SVG to circumflex marginal 1 vessel.  In 2014 Brian Brock had rotator cuff surgery. He  tolerated this well. He does have a history of peripheral vascular disease and is status post right common femoral and proximal SFA stenting with left SFA stenting. A followup  duplex study on  04/18/2013 demonstrated ABIs were excellent bilaterally at 1.1 with less than 49% diameter reduction noted in the right common iliac, right common femoral and SFA stent, and was less than 50% diameter reduction in the right distal SFA. There was 50-69% diameter reduction in the left external iliac artery and less than 49% and left common femoral. The left SFA stent was widely patent.  In  February 2016 he developed an episode of increasing shortness of breath associated with diaphoresis.  Two weeks later in the cold weather he developed severe chest tightness with shortness of breath and blood pressure elevation to 321 systolically.  He was seen by [name] on 07/06/2014 after he had again developed some chest pain while moving 40 pounds of bags of salt that he had put in a wheelbarrow.  I reviewed his office note. A nuclear perfusion study which was done on 07/13/2014.  This was significantly changed from his prior study and now was felt to be high risk with a large severe partially reversible anterior apical defect consistent with prior apical infarct, but now with evidence for severe ischemia in the distal anterior wall and apex.  There was apical hypokinesis.  Ejection fraction was 52%.  He was started on nitrates and presented to my office in follow-up.  I recommended definitive cardiac catheterization and on 07/24/2014.  This was performed revealed multivessel native CAD was 70% ostial LAD stenosis, 40-50% stenosis in the proximal LAD stent in 1995% stenosis after the stented segment in the native LAD with 60% ostial septal perforating artery stenosis.  There was 20 and 30% proximal circumflex stenoses  with a widely patent circumflex stent and had a widely patent proximal and mid RCA stents.  His LIMA graft was occluded in its midsegment prior to its anastomosis into the LAD.  He had widely patent vein graft supplying the distal circumflex marginal vessel.  He underwent successful PCI to the LAD at its  ostium and in its midsegment treated with angioscope scoring balloon, insertion of a 3.034 mm Resolute DES stent extending from the ostium to the mid LAD, postdilated 3.41 mm with entire region been reduced to 0%.  In May 2017 he underwent peripheral angiography and stenting of his left external iliac artery.  He also had a 90% calcific plaque in the right common femoral artery and 90% in-stent restenosis with in his replaced right SFA stent.  He underwent right common femoral endarterectomy and patch angioplasty by Dr. Velta Addison in August 2017 which moderately improved.  His claudication and in September underwent staged right SFA diamondback orbital rotational atherectomy and drug-eluting balloon angioplasty of his in-stent restenosis within the mid right SFA  stent.  Subsequently, his claudication has improved.  A follow-up lower extremity Doppler study shows significantly improved ABIs at 0.93 on the right and 1.0 on the left  Presently, he denies any significant exertional chest pain but hasn't experienced several episodes of nonexertional sternal, sharp discomfort.  He denies significant shortness of breath.  He denies any significant claudication symptoms.  He is in need for colonoscopy as well as endoscopy and has seen Dr. Lyndel Safe in [place].  In addition, he is in need for right inguinal hernia surgery which has been expanding and he is experiencing more discomfort.  He presents for evaluation.  Past Medical History:  Diagnosis Date  . Allergic rhinitis   . Anginal pain ([place]) 1997  . Arthritis    "lower back" (09/26/2015)  . Childhood asthma   . Chronic bronchitis ([place])    Yearly  . COPD (chronic obstructive pulmonary disease) ([place])   . Coronary artery disease    Multiple stents  . Dyslipidemia   . Dysrhythmia   . GERD (gastroesophageal reflux disease)   . History of hiatal hernia   . Hyperlipemia   . Hypertension   . Hypothyroidism   . Myocardial infarct ([place]) 1997   "mild"  .  Peripheral vascular disease ([place])    prior stenting    Past Surgical History:  Procedure Laterality Date  . CARDIAC CATHETERIZATION  2004   had remote tandem stents in 1997 and 1998 for LAD lesions, RCA stent in 1997 , midprto=ion, and a proximal RCA  SENT IN 1999, all DES; some were IVIS  guide.  [name] CARDIAC CATHETERIZATION  09/07/2012   demonstrated severe 2- vessel disease,patent LAD,patient circ and patent right stents, but 90% ostial LAD whic was new.  . CAROTID DOPPLER  100/24/11   RGT BULB and PROXIMAL ICA 0-49%; LFT. ICA  0-49 ; RGT SUBCLAVIAN ARTERY < 50% ; LFT SUBCLAVIAN normal patency; RGT  and LFT  VERTEBRAL ARTERIES  . CORONARY ANGIOPLASTY    . CORONARY ANGIOPLASTY WITH STENT PLACEMENT  '97, '98, '04, '06, '08, '10   seven cardiac stents  . CORONARY ARTERY BYPASS GRAFT N/A 09/12/2012   Procedure: OFF PUMP CORONARY ARTERY BYPASS GRAFTING (CABG);  Surgeon: Melrose Nakayama, MD;  Location: [place];  Service: Open Heart Surgery;  Laterality: N/A;  Times two using left internal mammary artery and endoscopically harvested right saphenous vein  ;LIMA-LAD and SVG -OM1  . DOPPLER ECHOCARDIOGRAPHY  11/23/2012,09/30/2010  LV EF 50-55%----09/30/2010-LV EF = >55%  . ENDARTERECTOMY FEMORAL Right 12/19/2015   Procedure: ENDARTERECTOMY RIGHT FEMORAL ARTERY;  Surgeon: Serafina Mitchell, MD;  Location: [place];  Service: Vascular;  Laterality: Right;  . event monitor  11/04/2012-11/17/2012   pt denies this hx on 07/24/2014  . FEMORAL ARTERY STENT Bilateral 7/06  8/08   Lt SFA '06, Rt SFA 8/08  . HERNIA REPAIR  10/2008   "repaired w/gallbladder OR"  . LAPAROSCOPIC CHOLECYSTECTOMY  June 2010  . LEFT HEART CATHETERIZATION WITH CORONARY ANGIOGRAM N/A 09/07/2012   Procedure: LEFT HEART CATHETERIZATION WITH CORONARY ANGIOGRAM;  Surgeon: Troy Sine, MD;  Location[facility] C[facility];  Service: Cardiovascular;  Laterality: N/A;  . LEFT HEART CATHETERIZATION WITH CORONARY/GRAFT ANGIOGRAM N/A 07/24/2014    Procedure: LEFT HEART CATHETERIZATION WITH Beatrix Fetters;  Surgeon: Troy Sine, MD; LAD 70%, 50% ISR, mLAD 99%, CFX 30%, patent stent, RCA patent, LIMA-LAD occluded, SVG-OM 1 patent, EF 50-55%  . LEXISCAN STRESS TEST  09/06/2012  . LOWER ARTERIAL DOPPLER  03/25/2012   FGT CIA 0-49%; RGT CFA AND SFA AT STENT 0-49%; RGT DISTAL SFA = <50%; LFT. EIA  50-69%; LFT. CFA 0-49% ;;LFT. SFA  AT STENT NORMAL PATENCY  . NM MYOVIEW LTD  05/06/2012   EF -STUDY NOT GATED, NORMAL STRESS NUCLEAR ,NORMAL LV FUNCTION  . PATCH ANGIOPLASTY Right 12/19/2015   Procedure: PATCH ANGIOPLASTY RIGHT FEMORAL ARTERY;  Surgeon: Serafina Mitchell, MD;  Location: [place];  Service: Vascular;  Laterality: Right;  . PERCUTANEOUS CORONARY STENT INTERVENTION (PCI-S) Right 07/24/2014   Procedure: PERCUTANEOUS CORONARY STENT INTERVENTION (PCI-S);  Surgeon: Troy Sine, MD; ostial LAD PTCA with angioscope scoring balloon, 3.0 x 34 mm Resolute DES extending from the ostium to the mid LAD, covering all significant stenoses   . PERIPHERAL VASCULAR CATHETERIZATION N/A 09/26/2015   Procedure: Abdominal Aortogram w/Lower Extremity;  Surgeon: Lorretta Harp, MD;  Location: [place] CV LAB;  Service: Cardiovascular;  Laterality: N/A;  . PERIPHERAL VASCULAR CATHETERIZATION Right 02/20/2016   Procedure: Peripheral Vascular Atherectomy;  Surgeon: Lorretta Harp, MD;  Location: [place] CV LAB;  Service: Cardiovascular;  Laterality: Right;  SFA  . PERIPHERAL VASCULAR CATHETERIZATION Right 02/20/2016   Procedure: Peripheral Vascular Balloon Angioplasty;  Surgeon: Lorretta Harp, MD;  Location: [place] CV LAB;  Service: Cardiovascular;  Laterality: Right;  SFA  . TONSILLECTOMY  1950's    Allergies  Allergen Reactions  . Metoprolol Other (See Comments)    Fatigue and malaise  . Levofloxacin     Unknown reaction  . Meloxicam Other (See Comments)    Achy, fatigue  . Metaxalone     Unknown reaction  . Niaspan [Niacin] Other  (See Comments)    Unknown reaction  . Tricor [Fenofibrate] Other (See Comments)    Tired/weak  . Fish Oil Rash  . Sulfonamide Derivatives Rash    unknown reaction - bad rash    Current Outpatient Prescriptions  Medication Sig Dispense Refill  . aspirin EC 81 MG tablet Take 81 mg by mouth daily.     . cetirizine (ZYRTEC) 10 MG tablet Take 10 mg by mouth daily as needed for allergies.     . cholestyramine (QUESTRAN) 4 G packet Take 1 packet by mouth 2 (two) times daily with a meal. 60 each 11  . clopidogrel (PLAVIX) 75 MG tablet TAKE ONE TABLET BY MOUTH EVERY DAY 90 tablet 3  . dicyclomine (BENTYL) 10 MG capsule Take 1 capsule (10 mg  total) by mouth 2 (two) times daily as needed for spasms (abdominal pain). 10 capsule 0  . isosorbide mononitrate (IMDUR) 30 MG 24 hr tablet TAKE 1/2 TABLET BY MOUTH EVERY DAY 15 tablet 11  . levothyroxine (SYNTHROID, LEVOTHROID) 25 MCG tablet Take 25 mcg by mouth daily before breakfast.     . losartan (COZAAR) 100 MG tablet TAKE ONE TABLET BY MOUTH EVERY DAY 30 tablet 11  . nebivolol (BYSTOLIC) 5 MG tablet Take 0.5 tablets (2.5 mg total) by mouth daily. 30 tablet 6  . NITROSTAT 0.4 MG SL tablet Place 1 tablet (0.4 mg total) under the tongue every 5 (five) minutes as needed for chest pain. 25 tablet 3  . pantoprazole (PROTONIX) 40 MG tablet Take 40 mg by mouth 2 (two) times daily.     . rosuvastatin (CRESTOR) 20 MG tablet Take 1 tablet (20 mg total) by mouth daily. 90 tablet 3  . umeclidinium bromide (INCRUSE ELLIPTA) 62.5 MCG/INH AEPB Inhale 1 puff into the lungs daily. 30 each 11   No current facility-administered medications for this visit.     Social History   Social History  . Marital status: Married    Spouse name: N/A  . Number of children: N/A  . Years of education: N/A   Occupational History  . Not on file.   Social History Main Topics  . Smoking status: Former Smoker    Packs/day: 0.50    Years: 56.00    Types: Cigarettes    Quit date:  03/25/2016  . Smokeless tobacco: Current User    Types: Chew     Comment: No smoking   . Alcohol use Yes     Comment: 09/26/2015 "varies; might have a few drinks q 2 months"  . Drug use: No  . Sexual activity: Not Currently   Other Topics Concern  . Not on file   Social History Narrative  . 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 Heis married , is one child and 4 grandchildren.[name]  He continues to work part time in [employer].  His work is physical and often requires lifting.  He had smoked heavily for 54 years. He is now smoking 3-cigarettes per day.  He does not drink alcohol or use illicit drugs.  Family History  Problem Relation Age of Onset  . Coronary artery disease Father 62  . Heart attack Father   . Coronary artery disease Brother 70       CABG in 2000    ROS General: Negative; No fevers, chills, or night sweats;  HEENT: Negative; No changes in vision or hearing, sinus congestion, difficulty swallowing Pulmonary: Negative; No cough, wheezing, shortness of breath, hemoptysis Cardiovascular: Negative; No chest pain, presyncope, syncope, palpitations Sinus post 3.recent peripheral vascular procedures as noted above GI: Negative; No nausea, vomiting, diarrhea, or abdominal pain GU: Negative; No dysuria, hematuria, or difficulty voiding Musculoskeletal: Improved range of motion in the left shoulder, but also notes intermittent twinges of pain; positive for occasional myalgias  Hematologic/Oncology: Negative; no easy bruising, bleeding Endocrine: Negative; no heat/cold intolerance; no diabetes Neuro: Negative; no changes in balance, headaches Skin: Negative; No rashes or skin lesions Psychiatric: Negative; No behavioral problems, depression Sleep: Negative; No snoring, daytime sleepiness, hypersomnolence, bruxism, restless legs, hypnogognic hallucinations, no cataplexy Other comprehensive 14 point system review is negative.  PE BP 138/72    Pulse (!) 55   Ht 5\' 9"  (1.753 m)   Wt 197 lb (89.4  kg)   BMI 29.09 kg/m    Repeat blood pressure by me 128/78.  Wt Readings from Last 3 Encounters:  10/22/16 197 lb (89.4 kg)  10/20/16 197 lb (89.4 kg)  09/16/16 199 lb 9.6 oz (90.5 kg)     Physical Exam BP 138/72   Pulse (!) 55   Ht _0  (1.753 m)   Wt 197 lb (89.4 kg)   BMI 29.09 kg/m  General: Alert, oriented, no distress.  Skin: normal turgor, no rashes, warm and dry HEENT: Normocephalic, atraumatic. Pupils equal round and reactive to light; sclera anicteric; extraocular muscles intact;  Nose without nasal septal hypertrophy Mouth/Parynx benign; Mallinpatti scale 3 Neck: No JVD, no carotid bruits; normal carotid upstroke Lungs: clear to ausculatation and percussion; no wheezing or rales Chest wall: without tenderness to palpitation Heart: PMI not displaced, RRR, s1 s2 normal, 1/6 systolic murmur, no diastolic murmur, no rubs, gallops, thrills, or heaves Abdomen: soft, nontender; no hepatosplenomehaly, BS+; abdominal aorta nontender and not dilated by palpation. Back: no CVA tenderness Pulses 2+ but perhaps minimally decreased at the dorsalis pedis and PT bilaterally. Musculoskeletal: full range of motion, normal strength, no joint deformities Extremities: no clubbing cyanosis or edema, Homan's sign negative  Neurologic: grossly nonfocal; Cranial nerves grossly wnl Psychologic: Normal mood and affect   ECG (independently read by me): Sinus bradycardia 55 bpm.  No significant ST-T changes.  PR interval 146 ms, QTc interval 417 ms.  October 2017 ECG (independently read by me): Sinus bradycardia 56 bpm with sinus arrhythmia and an isolated PVC.  QTc interval normal.  ECG (independently read by me): Sinus bradycardia 51 bpm.  Normal intervals.  No significant ST changes.  May 2016 ECG (independently read by me): Sinus bradycardia 53 bpm.  Normal intervals.  No ST changes.  March 2016 Prior ECG (independently read by  me): Normal sinus rhythm at 66 beats per minute.  No ectopy.  PR interval 150 ms.  QTc interval 421 ms  January 2015 ECG (independently read by me): Normal sinus rhythm at 67 beats per minute; normal intervals.  LABS:  BMET  BMP Latest Ref Rng & Units 10/22/2016 09/03/2016 02/21/2016  Glucose 65 - 99 mg/dL 105(H) 107(H) 86  BUN 8 - 27 mg/dL _1 Creatinine 0.76 - 1.27 mg/dL 1.11 1.28(H) 1.06  BUN/Creat Ratio 10 - 24 16 - -  Sodium 134 - 144 mmol/L 138 139 140  Potassium 3.5 - 5.2 mmol/L 4.7 3.9 3.9  Chloride 96 - 106 mmol/L 100 105 109  CO2 18 - 29 mmol/L _2 Calcium 8.6 - 10.2 mg/dL 9.8 9.0 8.3(L)     Hepatic Function Panel    Hepatic Function Latest Ref Rng & Units 10/22/2016 09/03/2016 12/11/2015  Total Protein 6.0 - 8.5 g/dL 7.3 6.6 6.0(L)  Albumin 3.5 - 4.8 g/dL 4.5 3.9 3.4(L)  AST 0 - 40 IU/L _3 ALT 0 - 44 IU/L _4 Alk Phosphatase 39 - 117 IU/L 103 76 87  Total Bilirubin 0.0 - 1.2 mg/dL 0.5 0.5 0.5    CBC  CBC Latest Ref Rng & Units 10/22/2016 09/03/2016 02/21/2016  WBC 3.4 - 10.8 x10E3/uL 8.2 10.9(H) 8.9  Hemoglobin 13.0 - 17.7 g/dL 15.6 14.0 11.8(L)  Hematocrit 37.5 - 51.0 % 46.4 41.8 36.6(L)  Platelets 150 - 379 x10E3/uL 231 236 188     BNP No results found for: PROBNP  Lipid Panel     Component Value Date/Time  CHOL 142 10/22/2016 1025   TRIG 237 (H) 10/22/2016 1025   HDL 37 (L) 10/22/2016 1025   CHOLHDL 3.8 10/22/2016 1025   CHOLHDL 3.0 09/06/2012 0455   VLDL 36 09/06/2012 0455   LDLCALC 58 10/22/2016 1025     RADIOLOGY: No results found.  IMPRESSION:  1. Preoperative cardiovascular examination   2. Coronary artery disease involving native coronary artery without angina pectoris, unspecified whether native or transplanted heart   3. Encounter for long-term (current) use of medications   4. PAD (peripheral artery disease) ([place])   5. Hyperlipidemia with target LDL less than 70   6. Non-recurrent unilateral inguinal hernia  without obstruction or gangrene     ASSESSMENT AND PLAN: Brian Brock is a 71 year old white male who has established CAD and PVD.  He had undergone prior stenting to his proximal LAD, circumflex coronary artery as well as right coronary artery and  is status post CABG surgery in April 2014 by Dr. Roxan Hockey.  He developed recurrent symptomatology and catheterization in 2014 revealed an occluded LIMA graft and high-grade 70% ostial LAD stenosis with 95% in-stent restenosis in the previously placed stent.  He underwent successful Angiosculpt scoring balloon insertion of a 3.034 mm Resolute DES stent extending from the ostium to the mid LAD with excellent result.  He is been without anginal symptomatology since.  He is status post 3 PVD interventions and surgery in 2017.  His claudication symptoms have improved.  He is in need for colonoscopy and endoscopy.  His blood pressure today is upper normal on losartan 662 mg daily, Bystolic 2.5 mg and isosorbide 15 mg. he has not had any exertionally precipitated chest pain episodes.  He is in need for colonoscopy and endoscopy.  I have given him clearance to undergo this procedure but have recommended he hold Plavix for at least 5 days and continue his baby aspirin.  Ultimately, he will need to have right inguinal hernia surgery later this year.  He will need general anesthesia for this, and prior to that surgery.  I have suggested that he undergo a follow-up [place] study to make certain he has not developed any significant ischemia in light of his extensive CAD, history.  He continues to be on Crestor 20 mg for hyperlipidemia with target LDL less than 70.  He is a history of hypothyroidism and is on very low-dose levothyroxine at 25 g.  He is bradycardic on low-dose Bystolic at 2.5 mg. I will see him in 6 months for reevaluation.    Time spent: 25 minutes  Troy Sine, MD, [facility]  10/22/2016 10:34 PM

## 2016-10-20 NOTE — Telephone Encounter (Signed)
Encounter complete. 

## 2016-10-20 NOTE — Patient Instructions (Addendum)
Medication Instructions:   No change  Labwork:   CMET, CBC, lipid profile (cholesterol test), and TSH to be drawn this week. This will be a fasting test.   Testing/Procedures:  Your physician has requested that you have a lexiscan myoview. For further information please visit [email or web address]. Please follow instruction sheet, as given.     Follow-Up:  6 months with Dr. Claiborne Billings   Any Other Special Instructions Will Be Listed Below (If Applicable).   You are cleared for your endoscopy and colonoscopy. You may hold plavix 5 days prior to this procedure, and resume this medication after the procedure. Continue taking your aspirin daily.  Further recommendations to follow after the lexiscan myoview (stress test).  If you need a refill on your cardiac medications before your next appointment, please call your pharmacy.

## 2016-10-20 NOTE — Telephone Encounter (Signed)
This encounter was created in error - please disregard.

## 2016-10-21 DIAGNOSIS — E782 Mixed hyperlipidemia: Secondary | ICD-10-CM | POA: Diagnosis not present

## 2016-10-21 DIAGNOSIS — G4709 Other insomnia: Secondary | ICD-10-CM | POA: Diagnosis not present

## 2016-10-21 DIAGNOSIS — K219 Gastro-esophageal reflux disease without esophagitis: Secondary | ICD-10-CM | POA: Diagnosis not present

## 2016-10-21 DIAGNOSIS — I10 Essential (primary) hypertension: Secondary | ICD-10-CM | POA: Diagnosis not present

## 2016-10-22 ENCOUNTER — Ambulatory Visit (HOSPITAL_COMMUNITY)
Admission: RE | Admit: 2016-10-22 | Discharge: 2016-10-22 | Disposition: A | Discharge status: Home / Self Care | Payer: Medicare Other | Source: Ambulatory Visit | Attending: Cardiovascular Disease | Admitting: Cardiovascular Disease

## 2016-10-22 DIAGNOSIS — Z01810 Encounter for preprocedural cardiovascular examination: Secondary | ICD-10-CM

## 2016-10-22 DIAGNOSIS — I2510 Atherosclerotic heart disease of native coronary artery without angina pectoris: Secondary | ICD-10-CM

## 2016-10-22 DIAGNOSIS — Z79899 Other long term (current) drug therapy: Secondary | ICD-10-CM | POA: Diagnosis not present

## 2016-10-22 LAB — COMPREHENSIVE METABOLIC PANEL
ALT: 21 IU/L (ref 0–44)
AST: 28 IU/L (ref 0–40)
Albumin/Globulin Ratio: 1.6 (ref 1.2–2.2)
Albumin: 4.5 g/dL (ref 3.5–4.8)
Alkaline Phosphatase: 103 IU/L (ref 39–117)
BUN/Creatinine Ratio: 16 (ref 10–24)
BUN: 18 mg/dL (ref 8–27)
Bilirubin Total: 0.5 mg/dL (ref 0.0–1.2)
CO2: 21 mmol/L (ref 18–29)
Calcium: 9.8 mg/dL (ref 8.6–10.2)
Chloride: 100 mmol/L (ref 96–106)
Creatinine, Ser: 1.11 mg/dL (ref 0.76–1.27)
GFR calc Af Amer: 77 mL/min/{1.73_m2} (ref >59)
GFR calc non Af Amer: 67 mL/min/{1.73_m2} (ref >59)
Globulin, Total: 2.8 g/dL (ref 1.5–4.5)
Glucose: 105 mg/dL — ABNORMAL HIGH (ref 65–99)
Potassium: 4.7 mmol/L (ref 3.5–5.2)
Sodium: 138 mmol/L (ref 134–144)
Total Protein: 7.3 g/dL (ref 6.0–8.5)

## 2016-10-22 LAB — LIPID PANEL
Chol/HDL Ratio: 3.8 ratio (ref 0.0–5.0)
Cholesterol, Total: 142 mg/dL (ref 100–199)
HDL: 37 mg/dL — ABNORMAL LOW (ref >39)
LDL Calculated: 58 mg/dL (ref 0–99)
Triglycerides: 237 mg/dL — ABNORMAL HIGH (ref 0–149)
VLDL Cholesterol Cal: 47 mg/dL — ABNORMAL HIGH (ref 5–40)

## 2016-10-22 LAB — CBC
Hematocrit: 46.4 % (ref 37.5–51.0)
Hemoglobin: 15.6 g/dL (ref 13.0–17.7)
MCH: 28.6 pg (ref 26.6–33.0)
MCHC: 33.6 g/dL (ref 31.5–35.7)
MCV: 85 fL (ref 79–97)
Platelets: 231 10*3/uL (ref 150–379)
RBC: 5.45 x10E6/uL (ref 4.14–5.80)
RDW: 13.8 % (ref 12.3–15.4)
WBC: 8.2 10*3/uL (ref 3.4–10.8)

## 2016-10-22 LAB — TSH: TSH: 3.68 u[IU]/mL (ref 0.450–4.500)

## 2016-10-22 MED ORDER — TECHNETIUM TC 99M TETROFOSMIN IV KIT
10.1000 | PACK | Freq: Once | INTRAVENOUS | Status: AC | PRN
  Administered 2016-10-22: 10.1 via INTRAVENOUS
  Filled 2016-10-22: qty 11

## 2016-10-22 MED ORDER — TECHNETIUM TC 99M TETROFOSMIN IV KIT
31.2000 | PACK | Freq: Once | INTRAVENOUS | Status: AC | PRN
  Administered 2016-10-22: 31.2 via INTRAVENOUS
  Filled 2016-10-22: qty 32

## 2016-10-22 MED ORDER — REGADENOSON 0.4 MG/5ML IV SOLN
0.4000 mg | Freq: Once | INTRAVENOUS | Status: AC
  Administered 2016-10-22: 0.4 mg via INTRAVENOUS

## 2016-10-23 ENCOUNTER — Telehealth: Payer: Self-pay | Admitting: *Deleted

## 2016-10-23 LAB — MYOCARDIAL PERFUSION IMAGING
LV dias vol: 108 mL (ref 62–150)
LV sys vol: 45 mL
Peak HR: 71 {beats}/min
Rest HR: 53 {beats}/min
SDS: 0
SRS: 0
SSS: 0
TID: 1.11

## 2016-10-23 NOTE — Telephone Encounter (Signed)
Note for record that the patient's 10/20/16 chart notes including learance notification was faxed to Dr. Lyndel Safe in [place].

## 2016-10-26 ENCOUNTER — Telehealth: Payer: Self-pay | Admitting: *Deleted

## 2016-10-26 NOTE — Telephone Encounter (Signed)
-----   Message from Troy Sine, MD sent at 10/25/2016  7:45 PM EDT ----- Normal perfusion, low risk.  Improved compared to 2016 study with resolution of anterior and apical defect

## 2016-10-26 NOTE — Telephone Encounter (Signed)
Called and notified patient of stress test results. Patient voiced verbal understanding. Thanked me for the call.

## 2016-11-04 ENCOUNTER — Other Ambulatory Visit: Payer: Self-pay | Admitting: Cardiovascular Disease

## 2016-11-04 NOTE — Telephone Encounter (Signed)
Rx has been sent to the pharmacy electronically. ° °

## 2016-11-06 DIAGNOSIS — R1013 Epigastric pain: Secondary | ICD-10-CM | POA: Diagnosis not present

## 2016-11-06 DIAGNOSIS — R197 Diarrhea, unspecified: Secondary | ICD-10-CM | POA: Diagnosis not present

## 2016-11-06 DIAGNOSIS — K2900 Acute gastritis without bleeding: Secondary | ICD-10-CM | POA: Diagnosis not present

## 2016-11-06 DIAGNOSIS — R195 Other fecal abnormalities: Secondary | ICD-10-CM | POA: Diagnosis not present

## 2016-11-06 DIAGNOSIS — K641 Second degree hemorrhoids: Secondary | ICD-10-CM | POA: Diagnosis not present

## 2016-11-06 DIAGNOSIS — K219 Gastro-esophageal reflux disease without esophagitis: Secondary | ICD-10-CM | POA: Diagnosis not present

## 2016-11-06 DIAGNOSIS — Z86010 Personal history of colonic polyps: Secondary | ICD-10-CM | POA: Diagnosis not present

## 2016-11-06 DIAGNOSIS — L449 Papulosquamous disorder, unspecified: Secondary | ICD-10-CM | POA: Diagnosis not present

## 2016-11-06 DIAGNOSIS — K449 Diaphragmatic hernia without obstruction or gangrene: Secondary | ICD-10-CM | POA: Diagnosis not present

## 2016-11-06 DIAGNOSIS — K5730 Diverticulosis of large intestine without perforation or abscess without bleeding: Secondary | ICD-10-CM | POA: Diagnosis not present

## 2016-11-06 DIAGNOSIS — R109 Unspecified abdominal pain: Secondary | ICD-10-CM | POA: Diagnosis not present

## 2016-11-06 HISTORY — PX: COLONOSCOPY: SHX174

## 2016-11-17 ENCOUNTER — Other Ambulatory Visit: Payer: Self-pay | Admitting: Cardiovascular Disease

## 2016-11-19 ENCOUNTER — Other Ambulatory Visit: Payer: Self-pay | Admitting: Cardiovascular Disease

## 2016-11-24 ENCOUNTER — Other Ambulatory Visit: Payer: Self-pay | Admitting: General Surgery

## 2016-12-18 DIAGNOSIS — R1013 Epigastric pain: Secondary | ICD-10-CM | POA: Diagnosis not present

## 2016-12-18 DIAGNOSIS — J449 Chronic obstructive pulmonary disease, unspecified: Secondary | ICD-10-CM | POA: Diagnosis not present

## 2016-12-18 DIAGNOSIS — Z720 Tobacco use: Secondary | ICD-10-CM | POA: Diagnosis not present

## 2016-12-18 DIAGNOSIS — R197 Diarrhea, unspecified: Secondary | ICD-10-CM | POA: Diagnosis not present

## 2016-12-18 DIAGNOSIS — Z951 Presence of aortocoronary bypass graft: Secondary | ICD-10-CM | POA: Diagnosis not present

## 2016-12-18 DIAGNOSIS — K4090 Unilateral inguinal hernia, without obstruction or gangrene, not specified as recurrent: Secondary | ICD-10-CM | POA: Diagnosis not present

## 2016-12-18 DIAGNOSIS — Z9049 Acquired absence of other specified parts of digestive tract: Secondary | ICD-10-CM | POA: Diagnosis not present

## 2016-12-18 DIAGNOSIS — Z7902 Long term (current) use of antithrombotics/antiplatelets: Secondary | ICD-10-CM | POA: Diagnosis not present

## 2016-12-23 ENCOUNTER — Other Ambulatory Visit (HOSPITAL_COMMUNITY): Payer: Self-pay | Admitting: *Deleted

## 2016-12-23 ENCOUNTER — Encounter (HOSPITAL_COMMUNITY): Payer: Self-pay

## 2016-12-23 ENCOUNTER — Encounter (HOSPITAL_COMMUNITY)
Admission: RE | Admit: 2016-12-23 | Discharge: 2016-12-23 | Disposition: A | Discharge status: Home / Self Care | Payer: Medicare Other | Source: Ambulatory Visit | Attending: General Surgery | Admitting: General Surgery

## 2016-12-23 DIAGNOSIS — Z7982 Long term (current) use of aspirin: Secondary | ICD-10-CM | POA: Insufficient documentation

## 2016-12-23 DIAGNOSIS — Z7902 Long term (current) use of antithrombotics/antiplatelets: Secondary | ICD-10-CM | POA: Diagnosis not present

## 2016-12-23 DIAGNOSIS — I2510 Atherosclerotic heart disease of native coronary artery without angina pectoris: Secondary | ICD-10-CM | POA: Diagnosis not present

## 2016-12-23 DIAGNOSIS — E785 Hyperlipidemia, unspecified: Secondary | ICD-10-CM | POA: Insufficient documentation

## 2016-12-23 DIAGNOSIS — Z951 Presence of aortocoronary bypass graft: Secondary | ICD-10-CM | POA: Diagnosis not present

## 2016-12-23 DIAGNOSIS — J449 Chronic obstructive pulmonary disease, unspecified: Secondary | ICD-10-CM | POA: Insufficient documentation

## 2016-12-23 DIAGNOSIS — I10 Essential (primary) hypertension: Secondary | ICD-10-CM | POA: Insufficient documentation

## 2016-12-23 DIAGNOSIS — K4090 Unilateral inguinal hernia, without obstruction or gangrene, not specified as recurrent: Secondary | ICD-10-CM | POA: Diagnosis not present

## 2016-12-23 DIAGNOSIS — Z01818 Encounter for other preprocedural examination: Secondary | ICD-10-CM | POA: Insufficient documentation

## 2016-12-23 DIAGNOSIS — I739 Peripheral vascular disease, unspecified: Secondary | ICD-10-CM | POA: Insufficient documentation

## 2016-12-23 DIAGNOSIS — Z720 Tobacco use: Secondary | ICD-10-CM | POA: Diagnosis not present

## 2016-12-23 DIAGNOSIS — Z9049 Acquired absence of other specified parts of digestive tract: Secondary | ICD-10-CM | POA: Diagnosis not present

## 2016-12-23 DIAGNOSIS — Z79899 Other long term (current) drug therapy: Secondary | ICD-10-CM | POA: Insufficient documentation

## 2016-12-23 HISTORY — DX: Unspecified atrial fibrillation: I48.91

## 2016-12-23 HISTORY — DX: Irritable bowel syndrome, unspecified: K58.9

## 2016-12-23 HISTORY — DX: Restless legs syndrome: G25.81

## 2016-12-23 LAB — COMPREHENSIVE METABOLIC PANEL
ALT: 16 U/L — ABNORMAL LOW (ref 17–63)
AST: 22 U/L (ref 15–41)
Albumin: 3.5 g/dL (ref 3.5–5.0)
Alkaline Phosphatase: 80 U/L (ref 38–126)
Anion gap: 8 (ref 5–15)
BUN: 23 mg/dL — ABNORMAL HIGH (ref 6–20)
CO2: 22 mmol/L (ref 22–32)
Calcium: 8.8 mg/dL — ABNORMAL LOW (ref 8.9–10.3)
Chloride: 111 mmol/L (ref 101–111)
Creatinine, Ser: 0.95 mg/dL (ref 0.61–1.24)
GFR calc Af Amer: >60 mL/min (ref >60)
GFR calc non Af Amer: >60 mL/min (ref >60)
Glucose, Bld: 100 mg/dL — ABNORMAL HIGH (ref 65–99)
Potassium: 4.2 mmol/L (ref 3.5–5.1)
Sodium: 141 mmol/L (ref 135–145)
Total Bilirubin: 0.6 mg/dL (ref 0.3–1.2)
Total Protein: 6.5 g/dL (ref 6.5–8.1)

## 2016-12-23 LAB — CBC WITH DIFFERENTIAL/PLATELET
Basophils Absolute: 0 10*3/uL (ref 0.0–0.1)
Basophils Relative: 0 %
Eosinophils Absolute: 0.3 10*3/uL (ref 0.0–0.7)
Eosinophils Relative: 3 %
HCT: 42.3 % (ref 39.0–52.0)
Hemoglobin: 14.3 g/dL (ref 13.0–17.0)
Lymphocytes Relative: 20 %
Lymphs Abs: 2.2 10*3/uL (ref 0.7–4.0)
MCH: 28.1 pg (ref 26.0–34.0)
MCHC: 33.8 g/dL (ref 30.0–36.0)
MCV: 83.3 fL (ref 78.0–100.0)
Monocytes Absolute: 0.8 10*3/uL (ref 0.1–1.0)
Monocytes Relative: 7 %
Neutro Abs: 7.9 10*3/uL — ABNORMAL HIGH (ref 1.7–7.7)
Neutrophils Relative %: 70 %
Platelets: 223 10*3/uL (ref 150–400)
RBC: 5.08 MIL/uL (ref 4.22–5.81)
RDW: 13.4 % (ref 11.5–15.5)
WBC: 11.3 10*3/uL — ABNORMAL HIGH (ref 4.0–10.5)

## 2016-12-23 LAB — PROTIME-INR
INR: 1
Prothrombin Time: 13.2 seconds (ref 11.4–15.2)

## 2016-12-23 LAB — APTT: aPTT: 28 seconds (ref 24–36)

## 2016-12-23 NOTE — Progress Notes (Signed)
   12/23/16 1124  OBSTRUCTIVE SLEEP APNEA  Have you ever been diagnosed with sleep apnea through a sleep study? No  Do you snore loudly (loud enough to be heard through closed doors)?  0  Do you often feel tired, fatigued, or sleepy during the daytime (such as falling asleep during driving or talking to someone)? 0  Has anyone observed you stop breathing during your sleep? 1  Do you have, or are you being treated for high blood pressure? 1  BMI more than 35 kg/m2? 0  Age > 50 (1-yes) 1  Neck circumference greater than:Male 16 inches or larger, Male 17inches or larger? 1  Male Gender (Yes=1) 1  Obstructive Sleep Apnea Score 5  Score 5 or greater  Results sent to PCP

## 2016-12-23 NOTE — Pre-Procedure Instructions (Signed)
Brian Brock  12/23/2016    Your procedure is scheduled on Monday, December 28, 2016 at 11:00 AM.   Report to [facility] Entrance "A" Admitting Office at 9:00 AM.   Call this number if you have problems the morning of surgery: [phone]   Questions prior to day of surgery, please call [phone] between 8 & 4 PM.   Remember:  Do not eat food or drink liquids after midnight Sunday, 12/27/16.  Take these medicines the morning of surgery with A SIP OF WATER: Bystolic, Cetirizine (Zyrtec), Isosorbide Mononitrate (Imdur), Levothyroxine (Synthroid, Pantoprazole (Protonix), Incruse Ellipta inhaler, Nitroglycerin - if needed  Follow cardiologist's instructions on stopping Plavix. Do not use NSAIDS (Ibuprofen, Aleve, etc) prior to surgery.    Do not wear jewelry.  Do not wear lotions, powders, cologne or deodorant.  Men may shave face and neck.  Do not bring valuables to the hospital.  [facility] is not responsible for any belongings or valuables.  Contacts, dentures or bridgework may not be worn into surgery.  Leave your suitcase in the car.  After surgery it may be brought to your room.  For patients admitted to the hospital, discharge time will be determined by your treatment team.  Patients discharged the day of surgery will not be allowed to drive home.   Early - Preparing for Surgery  Before surgery, you can play an important role.  Because skin is not sterile, your skin needs to be as free of germs as possible.  You can reduce the number of germs on you skin by washing with CHG (chlorahexidine gluconate) soap before surgery.  CHG is an antiseptic cleaner which kills germs and bonds with the skin to continue killing germs even after washing.  Please DO NOT use if you have an allergy to CHG or antibacterial soaps.  If your skin becomes reddened/irritated stop using the CHG and inform your nurse when you arrive at Short Stay.  Do not shave (including legs and  underarms) for at least 48 hours prior to the first CHG shower.  You may shave your face.  Please follow these instructions carefully:   1.  Shower with CHG Soap the night before surgery and the                    morning of Surgery.  2.  If you choose to wash your hair, wash your hair first as usual with your       normal shampoo.  3.  After you shampoo, rinse your hair and body thoroughly to remove the shampoo.  4.  Use CHG as you would any other liquid soap.  You can apply chg directly       to the skin and wash gently with scrungie or a clean washcloth.  5.  Apply the CHG Soap to your body ONLY FROM THE NECK DOWN.        Do not use on open wounds or open sores.  Avoid contact with your eyes, ears, mouth and genitals (private parts).  Wash genitals (private parts) with your normal soap.  6.  Wash thoroughly, paying special attention to the area where your surgery        will be performed.  7.  Thoroughly rinse your body with warm water from the neck down.  8.  DO NOT shower/wash with your normal soap after using and rinsing off       the CHG Soap.  9.  Pat yourself dry with a clean towel.            10.  Wear clean pajamas.            11.  Place clean sheets on your bed the night of your first shower and do not        sleep with pets.  Day of Surgery  Do not apply any lotions/deodorants the morning of surgery.  Please wear clean clothes to the hospital.   Please read over the fact sheets that you were given.

## 2016-12-23 NOTE — Progress Notes (Signed)
Pt has cardiac clearance from Dr. Claiborne Billings. Pt was instructed to stop his Plavix 5 days prior to surgery (last dose was 12/20/16) and to continue his Aspirin. Pt denies any recent chest pain or sob. Pt denies being diabetic.

## 2016-12-26 NOTE — H&P (Signed)
Brian Brock Location: [facility] Patient #: [id number] DOB: [birth year] Married / Language: English / Race: White Male      History of Present Illness       This is a pleasant 71 year old man who returns and is ready to schedule his right inguinal hernia repair. His wife is with him throughout the encounter. Dr. Shelva Majestic is his cardiologist. Dr. Janace Litten is his PCP appointment at [facility]. Dr. Trula Slade is his vascular surgeon. Dr. Jackquline Denmark is his gastroenterologist.       He has coronary disease. He's had coronary artery bypass and multiple stents and takes Plavix. Multiple cardiac catheterizations right groin. On December 19, 2015 he underwent iliofemoral endarterectomy and angioplasty by Dr. Trula Slade through a vertical right inguinal incision. He did well from the revascularization. He noticed a small bulge in his right groin in December 2017 that comes and goes. It has slowly gotten larger. I have seen him twice. Ultrasound shows no pseudoaneurysm. He says that the bulge is getting larger and is now more uncomfortable and is ready to have something done.      When I last saw him he wanted to go through a GI workup because of abdominal discomfort and diarrhea. He's had CT scan, upper endoscopy, and colonoscopy all of which look fairly normal. He has seen Dr. Claiborne Billings who told him he could stop his Plavix 7 days prior to hernia repair but continue the aspirin. This seems reasonable.      We are going to schedule him for elective repair of his right groin hernia. I've set aside extra time for this because it may be a little atypical and unusual because of unusual anatomy because of the scar and previous vascular procedures in the right groin. I think this can be done through an oblique incision overlying the inguinal canal. Hopefully this is just a variant of an inguinal hernia but it will need a mesh repair regardless as it is progressive.  I discussed the indications, techniques, details, and numerous risk of the surgery with him and his wife. He is aware of the risk of bleeding, infection, recurrence of the hernia, vascular or neurologic injury, chronic pain, injury to the bladder or intestine or testicle, and other unforeseen problems. He understands these issues well. All his questions are answered. He agrees with this plan.  He knows to hold his Plavix 7 days preop    Allergies  SulfADIAZINE *Sulfonamides2  Metoprolol Tartrate *BETA BLOCKERS*  LevoFLOXacin *FLUOROQUINOLONES*  Meloxicam *ANALGESICS - ANTI-INFLAMMATORY*  Niaspan *ANTIHYPERLIPIDEMICS*  Tricor *ANTIHYPERLIPIDEMICS*  Metaxalone *MUSCULOSKELETAL THERAPY AGENTS*  Fish Oil *NUTRIENTS*   Medication History Aspirin EC (81MG Tablet DR, Oral) Active. Nitrostat (0.4MG Tab Sublingual, Sublingual) Active. Cozaar (100MG Tablet, Oral) Active. Crestor (20MG Tablet, Oral) Active. Protonix (40MG Tablet DR, Oral) Active. Bystolic (5MG Tablet, Oral) Active. Plavix (75MG Tablet, Oral) Active. Imdur (30MG Tablet ER 24HR, Oral) Active. Synthroid (25MCG Tablet, Oral) Active. Cholestyramine (4GM Packet, Oral) Active. Incruse Ellipta (62.5MCG/INH Aero Pow Br Act, Inhalation) Active. Medications Reconciled  Vitals  Weight: 193.2 lb Height: 69in Body Surface Area: 2.04 m Body Mass Index: 28.53 kg/m  Temp.: 97.74F  Pulse: 53 (Regular)  BP: 130/80 (Sitting, Left Arm, Standard)       Physical Exam  General Mental Status-Alert. General Appearance-Not in acute distress. Build & Nutrition-Well nourished. Posture-Normal posture. Gait-Normal.  Head and Neck Head-normocephalic, atraumatic with no lesions or palpable masses. Trachea-midline. Thyroid Gland Characteristics - normal size and consistency  and no palpable nodules.  Chest and Lung Exam Chest and lung exam reveals -on auscultation, normal breath  sounds, no adventitious sounds and normal vocal resonance.  Cardiovascular Cardiovascular examination reveals -normal heart sounds, regular rate and rhythm with no murmurs and femoral artery auscultation bilaterally reveals normal pulses, no bruits, no thrills. Note: Healed sternotomy incision. Tiny epigastric hernia at the xiphoid.   Abdomen Inspection Inspection of the abdomen reveals - No Hernias. Palpation/Percussion Palpation and Percussion of the abdomen reveal - Soft, Non Tender, No Rigidity (guarding), No hepatosplenomegaly and No Palpable abdominal masses.  Male Genitourinary Note: Examined supine and standing. Larger bulge now lateral inguinal canal. Starts just lateral to the vertical groin incision. Obvious. Size of a large egg. No hernia on the left. Vertical scar right groin.   Neurologic Neurologic evaluation reveals -alert and oriented x 3 with no impairment of recent or remote memory, normal attention span and ability to concentrate, normal sensation and normal coordination.  Musculoskeletal Normal Exam - Bilateral-Upper Extremity Strength Normal and Lower Extremity Strength Normal.    Assessment & Plan RIGHT INGUINAL HERNIA (K40.90)    Your right groin hernia is larger, but still reducible you completed your GI workup with your gastroenterologist. You have had a colonoscopy, upper endoscopy, and CT scan, and nothing bad was found  You have met with Dr. 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  Dalbert Batman, M.D., [facility] General and Minimally invasive Surgery Breast and Colorectal Surgery Office:   [phone] Pager:   [phone]

## 2016-12-27 MED ORDER — CEFAZOLIN SODIUM-DEXTROSE 2-4 GM/100ML-% IV SOLN
2.0000 g | INTRAVENOUS | Status: AC
  Administered 2016-12-28: 2 g via INTRAVENOUS
  Filled 2016-12-27: qty 100

## 2016-12-28 ENCOUNTER — Ambulatory Visit (HOSPITAL_COMMUNITY): Payer: Medicare Other | Admitting: Anesthesiology

## 2016-12-28 ENCOUNTER — Encounter (HOSPITAL_COMMUNITY): Payer: Self-pay | Admitting: Surgery

## 2016-12-28 ENCOUNTER — Ambulatory Visit (HOSPITAL_COMMUNITY)
Admission: RE | Admit: 2016-12-28 | Discharge: 2016-12-28 | Disposition: A | Discharge status: Home / Self Care | Payer: Medicare Other | Source: Ambulatory Visit | Attending: General Surgery | Admitting: General Surgery

## 2016-12-28 ENCOUNTER — Encounter (HOSPITAL_COMMUNITY)
Admission: RE | Disposition: A | Discharge status: Home / Self Care | Payer: Self-pay | Source: Ambulatory Visit | Attending: General Surgery

## 2016-12-28 DIAGNOSIS — Z955 Presence of coronary angioplasty implant and graft: Secondary | ICD-10-CM | POA: Insufficient documentation

## 2016-12-28 DIAGNOSIS — Z7982 Long term (current) use of aspirin: Secondary | ICD-10-CM | POA: Insufficient documentation

## 2016-12-28 DIAGNOSIS — G8918 Other acute postprocedural pain: Secondary | ICD-10-CM | POA: Diagnosis not present

## 2016-12-28 DIAGNOSIS — E785 Hyperlipidemia, unspecified: Secondary | ICD-10-CM | POA: Insufficient documentation

## 2016-12-28 DIAGNOSIS — I10 Essential (primary) hypertension: Secondary | ICD-10-CM | POA: Insufficient documentation

## 2016-12-28 DIAGNOSIS — F17200 Nicotine dependence, unspecified, uncomplicated: Secondary | ICD-10-CM | POA: Insufficient documentation

## 2016-12-28 DIAGNOSIS — J45909 Unspecified asthma, uncomplicated: Secondary | ICD-10-CM | POA: Diagnosis not present

## 2016-12-28 DIAGNOSIS — Z79899 Other long term (current) drug therapy: Secondary | ICD-10-CM | POA: Insufficient documentation

## 2016-12-28 DIAGNOSIS — I739 Peripheral vascular disease, unspecified: Secondary | ICD-10-CM | POA: Diagnosis not present

## 2016-12-28 DIAGNOSIS — I2510 Atherosclerotic heart disease of native coronary artery without angina pectoris: Secondary | ICD-10-CM | POA: Diagnosis not present

## 2016-12-28 DIAGNOSIS — Z951 Presence of aortocoronary bypass graft: Secondary | ICD-10-CM | POA: Insufficient documentation

## 2016-12-28 DIAGNOSIS — I252 Old myocardial infarction: Secondary | ICD-10-CM | POA: Diagnosis not present

## 2016-12-28 DIAGNOSIS — Z7902 Long term (current) use of antithrombotics/antiplatelets: Secondary | ICD-10-CM | POA: Diagnosis not present

## 2016-12-28 DIAGNOSIS — K4090 Unilateral inguinal hernia, without obstruction or gangrene, not specified as recurrent: Secondary | ICD-10-CM | POA: Diagnosis present

## 2016-12-28 DIAGNOSIS — K4190 Unilateral femoral hernia, without obstruction or gangrene, not specified as recurrent: Secondary | ICD-10-CM | POA: Diagnosis not present

## 2016-12-28 DIAGNOSIS — K219 Gastro-esophageal reflux disease without esophagitis: Secondary | ICD-10-CM | POA: Insufficient documentation

## 2016-12-28 DIAGNOSIS — E039 Hypothyroidism, unspecified: Secondary | ICD-10-CM | POA: Insufficient documentation

## 2016-12-28 DIAGNOSIS — J449 Chronic obstructive pulmonary disease, unspecified: Secondary | ICD-10-CM | POA: Insufficient documentation

## 2016-12-28 HISTORY — PX: INGUINAL HERNIA REPAIR: SHX194

## 2016-12-28 HISTORY — DX: Unilateral femoral hernia, without obstruction or gangrene, not specified as recurrent: K41.90

## 2016-12-28 SURGERY — REPAIR, HERNIA, INGUINAL, ADULT
Anesthesia: Regional | Site: Groin | Laterality: Right

## 2016-12-28 MED ORDER — HYDROCODONE-ACETAMINOPHEN 5-325 MG PO TABS: 1.0000 | ORAL_TABLET | Freq: Four times a day (QID) | ORAL | 0 refills | Status: DC | PRN

## 2016-12-28 MED ORDER — SODIUM CHLORIDE 0.9 % IV SOLN: 250.0000 mL | INTRAVENOUS | Status: DC | PRN

## 2016-12-28 MED ORDER — PROPOFOL 10 MG/ML IV BOLUS
INTRAVENOUS | Status: DC | PRN
  Administered 2016-12-28: 150 mg via INTRAVENOUS

## 2016-12-28 MED ORDER — ACETAMINOPHEN 650 MG RE SUPP: 650.0000 mg | RECTAL | Status: DC | PRN

## 2016-12-28 MED ORDER — CHLORHEXIDINE GLUCONATE CLOTH 2 % EX PADS: 6.0000 | MEDICATED_PAD | Freq: Once | CUTANEOUS | Status: DC

## 2016-12-28 MED ORDER — ACETAMINOPHEN 325 MG PO TABS: 650.0000 mg | ORAL_TABLET | ORAL | Status: DC | PRN

## 2016-12-28 MED ORDER — FENTANYL CITRATE (PF) 100 MCG/2ML IJ SOLN
INTRAMUSCULAR | Status: AC
  Filled 2016-12-28: qty 2

## 2016-12-28 MED ORDER — GLYCOPYRROLATE 0.2 MG/ML IJ SOLN
INTRAMUSCULAR | Status: DC | PRN
  Administered 2016-12-28: 0.4 mg via INTRAVENOUS

## 2016-12-28 MED ORDER — DEXAMETHASONE SODIUM PHOSPHATE 10 MG/ML IJ SOLN
INTRAMUSCULAR | Status: AC
  Filled 2016-12-28: qty 1

## 2016-12-28 MED ORDER — BUPIVACAINE-EPINEPHRINE 0.5% -1:200000 IJ SOLN
INTRAMUSCULAR | Status: DC | PRN
  Administered 2016-12-28: 5 mL

## 2016-12-28 MED ORDER — LIDOCAINE 2% (20 MG/ML) 5 ML SYRINGE
INTRAMUSCULAR | Status: AC
  Filled 2016-12-28: qty 5

## 2016-12-28 MED ORDER — FENTANYL CITRATE (PF) 100 MCG/2ML IJ SOLN: 25.0000 ug | INTRAMUSCULAR | Status: DC | PRN

## 2016-12-28 MED ORDER — ONDANSETRON HCL 4 MG/2ML IJ SOLN
INTRAMUSCULAR | Status: DC | PRN
  Administered 2016-12-28: 4 mg via INTRAVENOUS

## 2016-12-28 MED ORDER — 0.9 % SODIUM CHLORIDE (POUR BTL) OPTIME
TOPICAL | Status: DC | PRN
  Administered 2016-12-28: 1000 mL

## 2016-12-28 MED ORDER — ROCURONIUM BROMIDE 10 MG/ML (PF) SYRINGE
PREFILLED_SYRINGE | INTRAVENOUS | Status: AC
  Filled 2016-12-28: qty 5

## 2016-12-28 MED ORDER — LACTATED RINGERS IV SOLN
INTRAVENOUS | Status: DC
  Administered 2016-12-28 (×2): via INTRAVENOUS

## 2016-12-28 MED ORDER — MIDAZOLAM HCL 2 MG/2ML IJ SOLN
2.0000 mg | Freq: Once | INTRAMUSCULAR | Status: AC
  Administered 2016-12-28: 2 mg via INTRAVENOUS

## 2016-12-28 MED ORDER — SUGAMMADEX SODIUM 200 MG/2ML IV SOLN
INTRAVENOUS | Status: AC
  Filled 2016-12-28: qty 2

## 2016-12-28 MED ORDER — FENTANYL CITRATE (PF) 250 MCG/5ML IJ SOLN
INTRAMUSCULAR | Status: DC | PRN
  Administered 2016-12-28: 100 ug via INTRAVENOUS
  Administered 2016-12-28: 50 ug via INTRAVENOUS

## 2016-12-28 MED ORDER — EPHEDRINE SULFATE-NACL 50-0.9 MG/10ML-% IV SOSY
PREFILLED_SYRINGE | INTRAVENOUS | Status: DC | PRN
  Administered 2016-12-28 (×2): 5 mg via INTRAVENOUS
  Administered 2016-12-28: 25 mg via INTRAVENOUS

## 2016-12-28 MED ORDER — SUGAMMADEX SODIUM 200 MG/2ML IV SOLN
INTRAVENOUS | Status: DC | PRN
  Administered 2016-12-28: 200 mg via INTRAVENOUS

## 2016-12-28 MED ORDER — BUPIVACAINE-EPINEPHRINE (PF) 0.5% -1:200000 IJ SOLN
INTRAMUSCULAR | Status: AC
  Filled 2016-12-28: qty 30

## 2016-12-28 MED ORDER — MIDAZOLAM HCL 2 MG/2ML IJ SOLN
INTRAMUSCULAR | Status: AC
  Administered 2016-12-28: 2 mg via INTRAVENOUS
  Filled 2016-12-28: qty 2

## 2016-12-28 MED ORDER — OXYCODONE HCL 5 MG PO TABS: 5.0000 mg | ORAL_TABLET | ORAL | Status: DC | PRN

## 2016-12-28 MED ORDER — SODIUM CHLORIDE 0.9% FLUSH: 3.0000 mL | Freq: Two times a day (BID) | INTRAVENOUS | Status: DC

## 2016-12-28 MED ORDER — EPHEDRINE 5 MG/ML INJ
INTRAVENOUS | Status: AC
  Filled 2016-12-28: qty 10

## 2016-12-28 MED ORDER — FENTANYL CITRATE (PF) 250 MCG/5ML IJ SOLN
INTRAMUSCULAR | Status: AC
  Filled 2016-12-28: qty 5

## 2016-12-28 MED ORDER — ROCURONIUM BROMIDE 10 MG/ML (PF) SYRINGE
PREFILLED_SYRINGE | INTRAVENOUS | Status: DC | PRN
  Administered 2016-12-28: 10 mg via INTRAVENOUS
  Administered 2016-12-28: 45 mg via INTRAVENOUS

## 2016-12-28 MED ORDER — LACTATED RINGERS IV SOLN: INTRAVENOUS | Status: DC

## 2016-12-28 MED ORDER — ONDANSETRON HCL 4 MG/2ML IJ SOLN
INTRAMUSCULAR | Status: AC
  Filled 2016-12-28: qty 2

## 2016-12-28 MED ORDER — SODIUM CHLORIDE 0.9% FLUSH: 3.0000 mL | INTRAVENOUS | Status: DC | PRN

## 2016-12-28 MED ORDER — LIDOCAINE 2% (20 MG/ML) 5 ML SYRINGE
INTRAMUSCULAR | Status: DC | PRN
  Administered 2016-12-28: 100 mg via INTRAVENOUS

## 2016-12-28 MED ORDER — ROPIVACAINE HCL 5 MG/ML IJ SOLN
INTRAMUSCULAR | Status: DC | PRN
  Administered 2016-12-28: 30 mL via PERINEURAL

## 2016-12-28 MED ORDER — DEXAMETHASONE SODIUM PHOSPHATE 10 MG/ML IJ SOLN
INTRAMUSCULAR | Status: DC | PRN
  Administered 2016-12-28: 10 mg via INTRAVENOUS

## 2016-12-28 SURGICAL SUPPLY — 49 items
BLADE CLIPPER SURG (BLADE) ×2 IMPLANT
BLADE SURG 10 STRL SS (BLADE) ×2 IMPLANT
BLADE SURG 15 STRL LF DISP TIS (BLADE) ×1 IMPLANT
BLADE SURG 15 STRL SS (BLADE) ×1
CANISTER SUCT 3000ML PPV (MISCELLANEOUS) ×2 IMPLANT
CHLORAPREP W/TINT 26ML (MISCELLANEOUS) ×2 IMPLANT
COVER SURGICAL LIGHT HANDLE (MISCELLANEOUS) ×2 IMPLANT
DERMABOND ADVANCED (GAUZE/BANDAGES/DRESSINGS) ×1
DERMABOND ADVANCED .7 DNX12 (GAUZE/BANDAGES/DRESSINGS) ×1 IMPLANT
DRAIN PENROSE 1/2X12 LTX STRL (WOUND CARE) ×2 IMPLANT
DRAPE LAPAROTOMY TRNSV 102X78 (DRAPE) ×2 IMPLANT
DRAPE UTILITY XL STRL (DRAPES) ×2 IMPLANT
ELECT CAUTERY BLADE 6.4 (BLADE) ×2 IMPLANT
ELECT REM PT RETURN 9FT ADLT (ELECTROSURGICAL) ×2
ELECTRODE REM PT RTRN 9FT ADLT (ELECTROSURGICAL) ×1 IMPLANT
GLOVE EUDERMIC 7 POWDERFREE (GLOVE) ×2 IMPLANT
GOWN STRL REUS W/ TWL LRG LVL3 (GOWN DISPOSABLE) ×1 IMPLANT
GOWN STRL REUS W/ TWL XL LVL3 (GOWN DISPOSABLE) ×1 IMPLANT
GOWN STRL REUS W/TWL LRG LVL3 (GOWN DISPOSABLE) ×1
GOWN STRL REUS W/TWL XL LVL3 (GOWN DISPOSABLE) ×1
KIT BASIN OR (CUSTOM PROCEDURE TRAY) ×2 IMPLANT
KIT ROOM TURNOVER OR (KITS) ×2 IMPLANT
MESH HERNIA 3X6 (Mesh General) ×2 IMPLANT
MESH ULTRAPRO 3X6 7.6X15CM (Mesh General) ×2 IMPLANT
NEEDLE HYPO 25GX1X1/2 BEV (NEEDLE) ×2 IMPLANT
NS IRRIG 1000ML POUR BTL (IV SOLUTION) ×2 IMPLANT
PACK SURGICAL SETUP 50X90 (CUSTOM PROCEDURE TRAY) ×2 IMPLANT
PAD ARMBOARD 7.5X6 YLW CONV (MISCELLANEOUS) ×2 IMPLANT
PENCIL BUTTON HOLSTER BLD 10FT (ELECTRODE) ×2 IMPLANT
SPONGE LAP 18X18 X RAY DECT (DISPOSABLE) ×2 IMPLANT
SUT MNCRL AB 4-0 PS2 18 (SUTURE) ×2 IMPLANT
SUT PROLENE 0 CT 1 CR/8 (SUTURE) ×2 IMPLANT
SUT PROLENE 2 0 CT2 30 (SUTURE) ×6 IMPLANT
SUT SILK 2 0 (SUTURE) ×1
SUT SILK 2 0 SH (SUTURE) IMPLANT
SUT SILK 2-0 18XBRD TIE 12 (SUTURE) ×1 IMPLANT
SUT SURG 0 T 19/GS 22 1969 62 (SUTURE) ×2 IMPLANT
SUT VIC AB 2-0 BRD 54 (SUTURE) ×2 IMPLANT
SUT VIC AB 2-0 CT1 27 (SUTURE) ×1
SUT VIC AB 2-0 CT1 36 (SUTURE) ×2 IMPLANT
SUT VIC AB 2-0 CT1 TAPERPNT 27 (SUTURE) ×1 IMPLANT
SUT VIC AB 3-0 SH 27 (SUTURE) ×1
SUT VIC AB 3-0 SH 27XBRD (SUTURE) ×1 IMPLANT
SYR BULB 3OZ (MISCELLANEOUS) ×2 IMPLANT
SYR CONTROL 10ML LL (SYRINGE) ×2 IMPLANT
TOWEL OR 17X24 6PK STRL BLUE (TOWEL DISPOSABLE) ×2 IMPLANT
TOWEL OR 17X26 10 PK STRL BLUE (TOWEL DISPOSABLE) ×2 IMPLANT
TUBE CONNECTING 12X1/4 (SUCTIONS) ×2 IMPLANT
YANKAUER SUCT BULB TIP NO VENT (SUCTIONS) ×2 IMPLANT

## 2016-12-28 NOTE — Discharge Instructions (Signed)
No sports or lifting more than 15 pounds for 8 weeks  Take a stool softener or laxative twice a day to prevent constipation  Restart Plavix on Tuesday if there is no bleeding    [facility] _______[facility]  UMBILICAL OR INGUINAL HERNIA REPAIR: POST OP INSTRUCTIONS  Always review your discharge instruction sheet given to you by the facility where your surgery was performed. IF YOU HAVE DISABILITY OR FAMILY LEAVE FORMS, YOU MUST BRING THEM TO THE OFFICE FOR PROCESSING.   DO NOT GIVE THEM TO YOUR DOCTOR.  1. A  prescription for pain medication may be given to you upon discharge.  Take your pain medication as prescribed, if needed.  If narcotic pain medicine is not needed, then you may take acetaminophen (Tylenol) or ibuprofen (Advil) as needed. 2. Take your usually prescribed medications unless otherwise directed. If you need a refill on your pain medication, please contact your pharmacy.  They will contact our office to request authorization. Prescriptions will not be filled after 5 pm or on week-ends. 3. You should follow a light diet the first 24 hours after arrival home, such as soup and crackers, etc.  Be sure to include lots of fluids daily.  Resume your normal diet the day after surgery. 4.Most patients will experience some swelling and bruising around the umbilicus or in the groin and scrotum.  Ice packs and reclining will help.  Swelling and bruising can take several days to resolve.  6. It is common to experience some constipation if taking pain medication after surgery.  Increasing fluid intake and taking a stool softener (such as Colace) will usually help or prevent this problem from occurring.  A mild laxative (Milk of Magnesia or Miralax) should be taken according to package directions if there are no bowel movements after 48 hours. 7. Unless discharge instructions indicate otherwise, you may remove your bandages 24-48 hours after surgery, and you may shower at that time.   You may have steri-strips (small skin tapes) in place directly over the incision.  These strips should be left on the skin for 7-10 days.  If your surgeon used skin glue on the incision, you may shower in 24 hours.  The glue will flake off over the next 2-3 weeks.  Any sutures or staples will be removed at the office during your follow-up visit. 8. ACTIVITIES:  You may resume regular (light) daily activities beginning the next day--such as daily self-care, walking, climbing stairs--gradually increasing activities as tolerated.  You may have sexual intercourse when it is comfortable.  Refrain from any heavy lifting or straining until approved by your doctor.  a.You may drive when you are no longer taking prescription pain medication, you can comfortably wear a seatbelt, and you can safely maneuver your car and apply brakes. b.RETURN TO WORK:   _____________________________________________  9.You should see your doctor in the office for a follow-up appointment approximately 2-3 weeks after your surgery.  Make sure that you call for this appointment within a day or two after you arrive home to insure a convenient appointment time. 10.OTHER INSTRUCTIONS: _________________________    _____________________________________  WHEN TO CALL YOUR DOCTOR: 1. Fever over 101.0 2. Inability to urinate 3. Nausea and/or vomiting 4. Extreme swelling or bruising 5. Continued bleeding from incision. 6. Increased pain, redness, or drainage from the incision  The clinic staff is available to answer your questions during regular business hours.  Please dont hesitate to call and ask to speak to one of  the nurses for clinical concerns.  If you have a medical emergency, go to the nearest emergency room or call 911.  A surgeon from [facility] is always on call at the hospital   [address] [place], [place], [place]  [address] ?  [address], [place], [place]   [phone] ?  [phone] ? FAX [phone] Web site: [email or web address]

## 2016-12-28 NOTE — Op Note (Addendum)
Patient Name:           Brian Brock   Date of Surgery:        12/28/2016  Pre op Diagnosis:      Right groin hernia  Post op Diagnosis:    Right femoral hernia  Procedure:                 Cooper's ligament repair of right femoral hernia with plug and patch technique  Surgeon:                     Edsel Petrin. Dalbert Batman, M.D., FACS  Assistant:                      Or staff   Indication for Assistant: N/A  Operative Indications:   This is a pleasant 71 year old man who returns and is ready to schedule his right inguinal hernia repair.  Dr. Shelva Majestic is his cardiologist. Dr. Janace Litten is his PCP appointment at [facility]. Dr. Trula Slade is his vascular surgeon. Dr. Jackquline Denmark is his gastroenterologist.       He has coronary disease. He's had coronary artery bypass and multiple stents and takes Plavix. Multiple cardiac catheterizations right groin. On December 19, 2015 he underwent iliofemoral endarterectomy and angioplasty by Dr. Trula Slade through a vertical right inguinal incision. He did well from the revascularization. He noticed a small bulge in his right groin in December 2017 that comes and goes. It has slowly gotten larger. I have seen him twice. Ultrasound shows no pseudoaneurysm.  CT scan shows a little bit of fat anterior to the femoral vessels.He says that the bulge is getting larger and is now more uncomfortable and is ready to have something done.      When I last saw him he wanted to go through a GI workup because of abdominal discomfort and diarrhea. He's had CT scan, upper endoscopy, and colonoscopy all of which look fairly normal. He has seen Dr. Claiborne Billings who told him he could stop his Plavix 7 days prior to hernia repair but continue the aspirin. This seems reasonable.      We are going to schedule him for elective repair of his right groin hernia. I've set aside extra time for this because it may be a little atypical and unusual because of unusual  anatomy because of the scar and previous vascular procedures in the right groin. I think this can be done through an oblique incision overlying the inguinal canal. Hopefully this is just a variant of an inguinal hernia but it will need a mesh repair regardless as it is progressive. I discussed the indications, techniques, details, and numerous risk of the surgery with him and his wife. He is aware of the risk of bleeding, infection, recurrence of the hernia, vascular or neurologic injury, chronic pain, injury to the bladder or intestine or testicle, and other unforeseen problems. He understands these issues well. All his questions are answered. He agrees with this plan.  He knows to hold his Plavix 7 days preop  Operative Findings:       He had a moderate-sized right femoral hernia.  The hernia sac presented below the right inguinal ligament and was a fairly large space, consistent with previous endarterectomy surgery.  There was a hernia bulge actually anterior and lateral to the femoral artery, but I was able to close this space down with a plug and interrupted stitches from mesh to  inguinal ligament laterally. and there was no defect below .  There was no evidence of indirect hernia.  The femoral space medially was closed with a typical Cooper's Ligament repair. Because of the size of the space I chose to put a plug and a patch technique  Procedure in Detail:          A TAPP block was performed preop.  The patient was taken to the operating room and underwent general endotracheal anesthesia.  The abdomen and genitalia and right thigh were prepped and draped in a sterile fashion.  Surgical timeout was performed.  Intravenous antibiotics were given.  0.5% Marcaine with epinephrine was used to anesthetize the skin and subcutaneous tissue.      An oblique incision was made in the right groin.  Dissection was carried down through subcutaneous tissue.  I identified the external oblique, the external  inguinal ring, and the femoral hernia sac presented below the inguinal ligament.  All this was carefully dissected out.  I incised the external oblique in the direction of its fibers, opening of the external inguinal ring.  I dissected the cord structure somewhat but there was no indirect sac.  The femoral site was quite large and ultimately had to divide the inguinal ligament overlying the Emerald vessels and denies carefully dissected the chronic sac away from the surrounding scar tissue.  This took some time.  I debrided the sac.  A 3" x 6" piece of Prolene mesh was brought to the operative field.  I trimmed it down a little bit but then created a cylinder and use this as a plug in the femoral space and sewed this in place with 0 Prolene sutures.  I then took a piece of 3" x 6" ultra Pro mesh and brought that the operative field to perform the patch technique.  This was done as a Cooper's ligament repair     I cleaned off Cooper's ligament from the pubic tubercle as far lateral as I felt safe.  I could feel the pulse of the femoral artery but there was a lot of scar tissue in this area.  Medially I placed 5 interrupted sutures of 0 Surgilon down through the mesh, through Cooper's ligament and then back up through the mesh.  The fifth and most lateral suture was used as a transition stitch and also incorporated the inguinal ligament.  The sutures were tied and this closed off the femoral space medially quite nicely.  The mesh was then secured to the inguinal ligament laterally with interrupted sutures of 0 Prolene.  The mesh was incised laterally so as to wraparound the cord structures at the internal ring.  Medially, superiorly, and superior laterally for the mattress sutures of Prolene were placed.  The tails of the mesh were overlapped laterally and further sutures of Prolene were placed.  This provided very secure coverage and repair with medial and lateral to the cord structures but allowed small fingertip  opening for the cord structures.  The wound was irrigated with saline.  Hemostasis was excellent.  The external Blight was closed laterally with running suture of 2-0 Vicryl.  Scarpa's fascia was closed with a running suture of 2-0 Vicryl.  The skin was closed with a running subcutaneous suture of 4-0 Monocryl and Dermabond.  The patient tolerated the procedure well was taken to PACU in stable condition.  EBL less than 20 mL.  Counts correct.  Complications none.  Edsel Petrin. Dalbert Batman, M.D., FACS General and Minimally Invasive  Surgery Breast and Colorectal Surgery  I logged on to the [facility] website and reviewed his prescription medication history   12/28/2016 12:45 PM

## 2016-12-28 NOTE — Anesthesia Preprocedure Evaluation (Addendum)
Anesthesia Evaluation  Patient identified by MRN, date of birth, ID band Patient awake    Reviewed: Allergy & Precautions, NPO status , Patient's Chart, lab work & pertinent test results, reviewed documented beta blocker date and time   Airway Mallampati: II  TM Distance: >3 FB Neck ROM: Full    Dental  (+) Teeth Intact, Upper Dentures, Partial Lower   Pulmonary asthma , COPD (controlled), Current Smoker,    Pulmonary exam normal breath sounds clear to auscultation       Cardiovascular Exercise Tolerance: Good hypertension, Pt. on medications and Pt. on home beta blockers + angina + CAD, + Past MI, + Cardiac Stents, + CABG and + Peripheral Vascular Disease   Rhythm:Regular Rate:Normal  ECG: SB, rate 55  Nuclear stress EF: 58%. The left ventricular ejection fraction is normal (55-65%). There was no ST segment deviation noted during stress. The study is normal. This is a low risk study. Compared to study of 2016, the large anterior and apical defect has resolved.    Normal perfusion, low risk.  Improved compared to 2016 study with resolution of anterior and apical defect   Sees cardiologist Claiborne Billings)   Neuro/Psych negative neurological ROS     GI/Hepatic Neg liver ROS, hiatal hernia, GERD  Medicated and Controlled,  Endo/Other  Hypothyroidism   Renal/GU negative Renal ROS     Musculoskeletal RLS   Abdominal   Peds  Hematology   Anesthesia Other Findings Dyslipidemia  Reproductive/Obstetrics                            Anesthesia Physical  Anesthesia Plan  ASA: III  Anesthesia Plan: General and Regional   Post-op Pain Management: GA combined w/ Regional for post-op pain   Induction: Intravenous  PONV Risk Score and Plan: 1 and Ondansetron, Dexamethasone and Midazolam  Airway Management Planned: Oral ETT  Additional Equipment: None  Intra-op Plan:   Post-operative Plan:  Extubation in OR  Informed Consent: I have reviewed the patients History and Physical, chart, labs and discussed the procedure including the risks, benefits and alternatives for the proposed anesthesia with the patient or authorized representative who has indicated his/her understanding and acceptance.   Dental advisory given  Plan Discussed with: CRNA  Anesthesia Plan Comments:      Anesthesia Quick Evaluation

## 2016-12-28 NOTE — Interval H&P Note (Signed)
History and Physical Interval Note:  12/28/2016 9:43 AM  Brian Brock  has presented today for surgery, with the diagnosis of right groin hernia  The various methods of treatment have been discussed with the patient and family. After consideration of risks, benefits and other options for treatment, the patient has consented to  Procedure(s): OPEN REPAIR RIGHT GROIN HERNIA WITH MESH (Right) as a surgical intervention .  The patient's history has been reviewed, patient examined, no change in status, stable for surgery.  I have reviewed the patient's chart and labs.  Questions were answered to the patient's satisfaction.     Adin Hector

## 2016-12-28 NOTE — Anesthesia Procedure Notes (Signed)
Anesthesia Regional Block: TAP block   Pre-Anesthetic Checklist: ,, timeout performed, Correct Patient, Correct Site, Correct Laterality, Correct Procedure,, site marked, risks and benefits discussed, Surgical consent,  Pre-op evaluation,  At surgeon's request and post-op pain management  Laterality: Right  Prep: chloraprep       Needles:  Injection technique: Single-shot  Needle Type: Echogenic Stimulator Needle     Needle Length: 10cm  Needle Gauge: 21     Additional Needles:   Procedures: ultrasound guided,,,,,,,,  Narrative:  Start time: 12/28/2016 10:10 AM End time: 12/28/2016 10:20 AM Injection made incrementally with aspirations every 5 mL.  Performed by: Personally  Anesthesiologist: Adele Barthel P  Additional Notes: Functioning IV was confirmed and monitors were applied.  A 179mm 21ga [place] echogenic stimulator needle was used. Sterile prep,hand hygiene and sterile gloves were used.  Negative aspiration and negative test dose prior to incremental administration of local anesthetic. The patient tolerated the procedure well.

## 2016-12-28 NOTE — Anesthesia Procedure Notes (Signed)
Procedure Name: Intubation Date/Time: 12/28/2016 11:14 AM Performed by: Talbot Grumbling Pre-anesthesia Checklist: Patient identified, Emergency Drugs available, Suction available and Patient being monitored Patient Re-evaluated:Patient Re-evaluated prior to induction Oxygen Delivery Method: Circle system utilized Preoxygenation: Pre-oxygenation with 100% oxygen Induction Type: IV induction Ventilation: Mask ventilation without difficulty Laryngoscope Size: Mac and 4 Grade View: Grade I Tube type: Oral Tube size: 8.0 mm Number of attempts: 1 Airway Equipment and Method: Stylet Placement Confirmation: ETT inserted through vocal cords under direct vision,  positive ETCO2 and breath sounds checked- equal and bilateral Secured at: 23 cm Tube secured with: Tape Dental Injury: Teeth and Oropharynx as per pre-operative assessment

## 2016-12-28 NOTE — Transfer of Care (Signed)
Immediate Anesthesia Transfer of Care Note  Patient: Brian Brock  Procedure(s) Performed: Procedure(s): OPEN REPAIR RIGHT GROIN HERNIA WITH MESH (Right)  Patient Location: PACU  Anesthesia Type:General  Level of Consciousness:  sedated, patient cooperative and responds to stimulation  Airway & Oxygen Therapy:Patient Spontanous Breathing and Patient connected to face mask oxgen  Post-op Assessment:  Report given to PACU RN and Post -op Vital signs reviewed and stable  Post vital signs:  Reviewed and stable  Last Vitals:  Vitals:   12/28/16 0906  BP: 116/67  Pulse: (!) 56  Resp: 18  Temp: 36.5 C  SpO2: 55%    Complications: No apparent anesthesia complications

## 2016-12-28 NOTE — Anesthesia Postprocedure Evaluation (Signed)
Anesthesia Post Note  Patient: [name]  Procedure(s) Performed: Procedure(s) (LRB): OPEN REPAIR RIGHT GROIN HERNIA WITH MESH (Right)     Patient location during evaluation: PACU Anesthesia Type: Regional and General Level of consciousness: awake and alert Pain management: pain level controlled Vital Signs Assessment: post-procedure vital signs reviewed and stable Respiratory status: spontaneous breathing, nonlabored ventilation, respiratory function stable and patient connected to nasal cannula oxygen Cardiovascular status: blood pressure returned to baseline and stable Postop Assessment: no signs of nausea or vomiting Anesthetic complications: no    Last Vitals:  Vitals:   12/28/16 1400 12/28/16 1430  BP: 136/67 131/61  Pulse: 60   Resp:    Temp:    SpO2: 100% 96%    Last Pain:  Vitals:   12/28/16 1320  TempSrc:   PainSc: 2                  Kaimana Neuzil P [place]

## 2016-12-29 ENCOUNTER — Encounter (HOSPITAL_COMMUNITY): Payer: Self-pay | Admitting: General Surgery

## 2017-02-17 DIAGNOSIS — J441 Chronic obstructive pulmonary disease with (acute) exacerbation: Secondary | ICD-10-CM | POA: Diagnosis not present

## 2017-02-17 DIAGNOSIS — L02214 Cutaneous abscess of groin: Secondary | ICD-10-CM | POA: Diagnosis not present

## 2017-03-10 ENCOUNTER — Encounter (HOSPITAL_COMMUNITY): Payer: Medicare Other

## 2017-03-17 ENCOUNTER — Other Ambulatory Visit: Payer: Self-pay | Admitting: Cardiovascular Disease

## 2017-03-30 DIAGNOSIS — Z23 Encounter for immunization: Secondary | ICD-10-CM | POA: Diagnosis not present

## 2017-04-02 ENCOUNTER — Ambulatory Visit (HOSPITAL_BASED_OUTPATIENT_CLINIC_OR_DEPARTMENT_OTHER)
Admission: RE | Admit: 2017-04-02 | Discharge: 2017-04-02 | Disposition: A | Discharge status: Home / Self Care | Payer: Medicare Other | Source: Ambulatory Visit | Attending: Cardiovascular Disease | Admitting: Cardiovascular Disease

## 2017-04-02 ENCOUNTER — Ambulatory Visit (HOSPITAL_COMMUNITY)
Admission: RE | Admit: 2017-04-02 | Discharge: 2017-04-02 | Disposition: A | Discharge status: Home / Self Care | Payer: Medicare Other | Source: Ambulatory Visit | Attending: Cardiology | Admitting: Cardiology

## 2017-04-02 ENCOUNTER — Other Ambulatory Visit: Payer: Self-pay | Admitting: Cardiovascular Disease

## 2017-04-02 ENCOUNTER — Encounter (HOSPITAL_COMMUNITY): Payer: Medicare Other

## 2017-04-02 DIAGNOSIS — I739 Peripheral vascular disease, unspecified: Secondary | ICD-10-CM

## 2017-04-12 DIAGNOSIS — I10 Essential (primary) hypertension: Secondary | ICD-10-CM | POA: Diagnosis not present

## 2017-04-12 DIAGNOSIS — E039 Hypothyroidism, unspecified: Secondary | ICD-10-CM | POA: Diagnosis not present

## 2017-04-12 DIAGNOSIS — J441 Chronic obstructive pulmonary disease with (acute) exacerbation: Secondary | ICD-10-CM | POA: Diagnosis not present

## 2017-05-03 DIAGNOSIS — J441 Chronic obstructive pulmonary disease with (acute) exacerbation: Secondary | ICD-10-CM | POA: Diagnosis not present

## 2017-05-03 DIAGNOSIS — J0100 Acute maxillary sinusitis, unspecified: Secondary | ICD-10-CM | POA: Diagnosis not present

## 2017-05-05 ENCOUNTER — Other Ambulatory Visit: Payer: Self-pay | Admitting: Cardiovascular Disease

## 2017-05-05 DIAGNOSIS — I739 Peripheral vascular disease, unspecified: Secondary | ICD-10-CM

## 2017-05-16 DIAGNOSIS — J0100 Acute maxillary sinusitis, unspecified: Secondary | ICD-10-CM | POA: Diagnosis not present

## 2017-05-16 DIAGNOSIS — R300 Dysuria: Secondary | ICD-10-CM | POA: Diagnosis not present

## 2017-06-07 ENCOUNTER — Encounter: Payer: Self-pay | Admitting: Adult Health

## 2017-06-07 ENCOUNTER — Ambulatory Visit (INDEPENDENT_AMBULATORY_CARE_PROVIDER_SITE_OTHER): Payer: Medicare Other | Admitting: Adult Health

## 2017-06-07 VITALS — BP 124/68 | HR 50 | Ht 69.0 in | Wt 189.8 lb

## 2017-06-07 DIAGNOSIS — E7800 Pure hypercholesterolemia, unspecified: Secondary | ICD-10-CM

## 2017-06-07 DIAGNOSIS — I739 Peripheral vascular disease, unspecified: Secondary | ICD-10-CM | POA: Diagnosis not present

## 2017-06-07 DIAGNOSIS — I10 Essential (primary) hypertension: Secondary | ICD-10-CM

## 2017-06-07 DIAGNOSIS — I2510 Atherosclerotic heart disease of native coronary artery without angina pectoris: Secondary | ICD-10-CM | POA: Diagnosis not present

## 2017-06-07 NOTE — Patient Instructions (Signed)
Medication Instructions:  NO CHANGES-Your physician recommends that you continue on your current medications as directed. Please refer to the Current Medication list given to you today.  If you need a refill on your cardiac medications before your next appointment, please call your pharmacy.  Follow-Up: Your physician wants you to follow-up in: [place]. You should receive a reminder letter in the mail two months in advance. If you do not receive a letter, please call our office MAY 2019 to schedule the July 2019 follow-up appointment.   Thank you for choosing [facility] at [facility]!!

## 2017-06-07 NOTE — Progress Notes (Signed)
Cardiology Office Note   Date:  06/07/2017   ID:  Brian Brock, DOB 05-29-1945, MRN [id number]  PCP:  [name], MD  Cardiologist: Shelva Majestic, MD   Chief Complaint  Patient presents with  . Follow-up     History of Present Illness: Brian Brock is a 72 y.o. male who presents for ongoing assessment and management of CAD, he has a history of PCI and interventions with stenting to his LAD and RCA in 1997, 1998, and 2004 to his proximal circumflex coronary artery.  He also has peripheral vascular disease with stenting to his left SFA in 2004 and right SFA in 2008.  Hypertension, hyperlipidemia, peripheral vascular disease, history of hypothyroidism, and ongoing tobacco abuse.  The patient also has a history of coronary artery bypass grafting completed on 09/12/2012 with LIMA to LAD and SVG to circumflex marginal 1 vessel.  In the setting of peripheral arterial disease, in May 2017 he underwent angiography and stenting of his left external iliac artery, was found to have a 90% calcific plaque in the right common femoral artery and a 90% in-stent restenosis and was placed right FSA stent.  He had right common femoral endarterectomy, and patch angioplasty in August 2017.His claudication and in September underwent staged right SFA diamondback orbital rotational atherectomy and drug-eluting balloon angioplasty of his in-stent restenosis within the mid right SFA  stent.  Subsequently, his claudication has improved.  A follow-up lower extremity Doppler study shows significantly improved ABIs at 0.93 on the right and 1.0 on the left  He was last seen in the office on 10/20/2016 by Dr. Claiborne Billings, at that time he was suggested to have a follow-up [place] Myoview study to evaluate for progressive CAD and ischemia.   He comes today without any cardiac complaints.  He is medically compliant denies any dyspnea chest discomfort or fatigue.  The patient is being treated for UTI and is on the second  treatment of antibiotic therapy.  This is being followed by his PCP.  Annual labs are also followed by his PCP.   RAHEIM BEUTLER  GATED SPECT MYO PERF [facility] STRESS 1D  Order# [id number]  Reading physician: Sueanne Margarita, MD Ordering physician: Troy Sine, MD Study date: 10/22/16  Patient Information   Name MRN Description  Brian Brock [id number] 72 y.o. Male  Result Notes for Myocardial Perfusion Imaging   Notes recorded by Troy Sine, MD on 10/25/2016 at 7:45 PM EDT Normal perfusion, low risk. Improved compared to 2016 study with resolution of anterior and apical defect      Vitals   Height Weight BMI (Calculated)  5\' 9"  (1.753 m) 197 lb (89.4 kg) 29.2   NM Stress Test 09/21/2016.  Study Highlights    Nuclear stress EF: 58%.  The left ventricular ejection fraction is normal (55-65%).  There was no ST segment deviation noted during stress.  The study is normal.  This is a low risk study.  Compared to study of 2016, the large anterior and apical defect has resolved.     Past Medical History:  Diagnosis Date  . AF (atrial fibrillation) ([place])    happened after CABG, has not had it since  . Allergic rhinitis   . Anginal pain ([place]) 1997  . Arthritis    "lower back" (09/26/2015)  . Childhood asthma   . Chronic bronchitis ([place])    Yearly  . COPD (chronic obstructive pulmonary disease) ([place])   . Coronary artery disease  Multiple stents  . Dyslipidemia   . Dysrhythmia   . Femoral hernia of right side 12/28/2016  . GERD (gastroesophageal reflux disease)   . History of hiatal hernia   . Hyperlipemia   . Hypertension   . Hypothyroidism   . Irritable bowel syndrome   . Myocardial infarct ([place]) 1997   "mild"  . Peripheral vascular disease ([place])    prior stenting  . Restless legs     Past Surgical History:  Procedure Laterality Date  . CARDIAC CATHETERIZATION  2004   had remote tandem stents in 1997 and 1998 for LAD lesions, RCA stent in  1997 , midprto=ion, and a proximal RCA  SENT IN 1999, all DES; some were IVIS  guide.  [name] CARDIAC CATHETERIZATION  09/07/2012   demonstrated severe 2- vessel disease,patent LAD,patient circ and patent right stents, but 90% ostial LAD whic was new.  . CAROTID DOPPLER  100/24/11   RGT BULB and PROXIMAL ICA 0-49%; LFT. ICA  0-49 ; RGT SUBCLAVIAN ARTERY < 50% ; LFT SUBCLAVIAN normal patency; RGT  and LFT  VERTEBRAL ARTERIES  . COLONOSCOPY    . CORONARY ANGIOPLASTY    . CORONARY ANGIOPLASTY WITH STENT PLACEMENT  '97, '98, '04, '06, '08, '10   seven cardiac stents  . CORONARY ARTERY BYPASS GRAFT N/A 09/12/2012   Procedure: OFF PUMP CORONARY ARTERY BYPASS GRAFTING (CABG);  Surgeon: Melrose Nakayama, MD;  Location: [place];  Service: Open Heart Surgery;  Laterality: N/A;  Times two using left internal mammary artery and endoscopically harvested right saphenous vein  ;LIMA-LAD and SVG -OM1  . DOPPLER ECHOCARDIOGRAPHY  11/23/2012,09/30/2010    LV EF 50-55%----09/30/2010-LV EF = >55%  . ENDARTERECTOMY FEMORAL Right 12/19/2015   Procedure: ENDARTERECTOMY RIGHT FEMORAL ARTERY;  Surgeon: Serafina Mitchell, MD;  Location: [place];  Service: Vascular;  Laterality: Right;  . event monitor  11/04/2012-11/17/2012   pt denies this hx on 07/24/2014  . FEMORAL ARTERY STENT Bilateral 7/06  8/08   Lt SFA '06, Rt SFA 8/08  . HERNIA REPAIR  10/2008   "repaired w/gallbladder OR"  . INGUINAL HERNIA REPAIR Right 12/28/2016   Procedure: OPEN REPAIR RIGHT GROIN HERNIA WITH MESH;  Surgeon: Fanny Skates, MD;  Location: [place];  Service: General;  Laterality: Right;  . LAPAROSCOPIC CHOLECYSTECTOMY  June 2010  . LEFT HEART CATHETERIZATION WITH CORONARY ANGIOGRAM N/A 09/07/2012   Procedure: LEFT HEART CATHETERIZATION WITH CORONARY ANGIOGRAM;  Surgeon: Troy Sine, MD;  Location: [facility];  Service: Cardiovascular;  Laterality: N/A;  . LEFT HEART CATHETERIZATION WITH CORONARY/GRAFT ANGIOGRAM N/A 07/24/2014   Procedure: LEFT HEART  CATHETERIZATION WITH Beatrix Fetters;  Surgeon: Troy Sine, MD; LAD 70%, 50% ISR, mLAD 99%, CFX 30%, patent stent, RCA patent, LIMA-LAD occluded, SVG-OM 1 patent, EF 50-55%  . LEXISCAN STRESS TEST  09/06/2012  . LOWER ARTERIAL DOPPLER  03/25/2012   FGT CIA 0-49%; RGT CFA AND SFA AT STENT 0-49%; RGT DISTAL SFA = <50%; LFT. EIA  50-69%; LFT. CFA 0-49% ;;LFT. SFA  AT STENT NORMAL PATENCY  . NM MYOVIEW LTD  05/06/2012   EF -STUDY NOT GATED, NORMAL STRESS NUCLEAR ,NORMAL LV FUNCTION  . PATCH ANGIOPLASTY Right 12/19/2015   Procedure: PATCH ANGIOPLASTY RIGHT FEMORAL ARTERY;  Surgeon: Serafina Mitchell, MD;  Location: [place];  Service: Vascular;  Laterality: Right;  . PERCUTANEOUS CORONARY STENT INTERVENTION (PCI-S) Right 07/24/2014   Procedure: PERCUTANEOUS CORONARY STENT INTERVENTION (PCI-S);  Surgeon: Troy Sine, MD; ostial LAD PTCA with  angioscope scoring balloon, 3.0 x 34 mm Resolute DES extending from the ostium to the mid LAD, covering all significant stenoses   . PERIPHERAL VASCULAR CATHETERIZATION N/A 09/26/2015   Procedure: Abdominal Aortogram w/Lower Extremity;  Surgeon: Lorretta Harp, MD;  Location: [place] CV LAB;  Service: Cardiovascular;  Laterality: N/A;  . PERIPHERAL VASCULAR CATHETERIZATION Right 02/20/2016   Procedure: Peripheral Vascular Atherectomy;  Surgeon: Lorretta Harp, MD;  Location: [place] CV LAB;  Service: Cardiovascular;  Laterality: Right;  SFA  . PERIPHERAL VASCULAR CATHETERIZATION Right 02/20/2016   Procedure: Peripheral Vascular Balloon Angioplasty;  Surgeon: Lorretta Harp, MD;  Location: [place] CV LAB;  Service: Cardiovascular;  Laterality: Right;  SFA  . TONSILLECTOMY  1950's     Current Outpatient Medications  Medication Sig Dispense Refill  . aspirin EC 81 MG tablet Take 81 mg by mouth daily.     [name] BYSTOLIC 5 MG tablet TAKE 1/2 TABLET ONCE DAILY 30 tablet 6  . cetirizine (ZYRTEC) 10 MG tablet Take 10 mg by mouth daily.     .  cholestyramine (QUESTRAN) 4 G packet Take 1 packet by mouth 2 (two) times daily with a meal. 60 each 11  . clopidogrel (PLAVIX) 75 MG tablet TAKE ONE TABLET BY MOUTH EVERY DAY 90 tablet 3  . doxycycline (VIBRAMYCIN) 100 MG capsule Take 1 capsule by mouth 2 (two) times daily.    . fluconazole (DIFLUCAN) 100 MG tablet     . isosorbide mononitrate (IMDUR) 30 MG 24 hr tablet TAKE 1/2 TABLET BY MOUTH EVERY DAY 45 tablet 2  . levothyroxine (SYNTHROID, LEVOTHROID) 25 MCG tablet Take 25 mcg by mouth daily before breakfast.     . losartan (COZAAR) 100 MG tablet TAKE ONE TABLET BY MOUTH EVERY DAY 30 tablet 11  . nitroGLYCERIN (NITROSTAT) 0.4 MG SL tablet PLACE 1 TABLET UNDER THE TONGUE, MAY REPEAT EVERY 5 MINUTES, MAX: 3 TABLETS. 25 tablet 5  . pantoprazole (PROTONIX) 40 MG tablet Take 40 mg by mouth 2 (two) times daily.     . Probiotic Product (PROBIOTIC PO) Take 1 tablet by mouth daily.    . rosuvastatin (CRESTOR) 20 MG tablet TAKE ONE TABLET BY MOUTH EVERY DAY 90 tablet 3   No current facility-administered medications for this visit.     Allergies:   Metoprolol; Tricor [fenofibrate]; Fenofibrate micronized; Levofloxacin; Metaxalone; Niaspan [niacin]; Fish oil; Meloxicam; and Sulfadiazine    Social History:  The patient  reports that he has been smoking cigarettes.  He has a 28.00 pack-year smoking history. He has quit using smokeless tobacco. His smokeless tobacco use included chew. He reports that he drinks alcohol. He reports that he does not use drugs.   Family History:  The patient's family history includes Coronary artery disease (age of onset: 83) in his father; Coronary artery disease (age of onset: 36) in his brother; Heart attack in his father.    ROS: All other systems are reviewed and negative. Unless otherwise mentioned in H&P    PHYSICAL EXAM: VS:  BP 124/68   Pulse (!) 50   Ht 5\' 9"  (1.753 m)   Wt 189 lb 12.8 oz (86.1 kg)   BMI 28.03 kg/m  , BMI Body mass index is 28.03  kg/m. GEN: Well nourished, well developed, in no acute distress  HEENT: normal  Neck: no JVD, carotid bruits, or masses Cardiac: RRR; no murmurs, rubs, or gallops,no edema  Respiratory:  clear to auscultation bilaterally, normal work of breathing GI: soft,  nontender, nondistended, + BS MS: no deformity or atrophy  Skin: warm and dry, no rash Neuro:  Strength and sensation are intact Psych: euthymic mood, full affect   EKG: Sinus bradycardia heart rate of 50 bpm, occasional sinus arrhythmia.  Recent Labs: November 21, 2016: TSH 3.680 12/23/2016: ALT 16; BUN 23; Creatinine, Ser 0.95; Hemoglobin 14.3; Platelets 223; Potassium 4.2; Sodium 141    Lipid Panel    Component Value Date/Time   CHOL 142 2016/11/21 1025   TRIG 237 (H) 2016/11/21 1025   HDL 37 (L) November 21, 2016 1025   CHOLHDL 3.8 11/21/2016 1025   CHOLHDL 3.0 09/06/2012 0455   VLDL 36 09/06/2012 0455   LDLCALC 58 21-Nov-2016 1025      Wt Readings from Last 3 Encounters:  06/07/17 189 lb 12.8 oz (86.1 kg)  12/28/16 194 lb 14.2 oz (88.4 kg)  12/23/16 194 lb 14.2 oz (88.4 kg)      Other studies Reviewed: NM Stress Test 11-21-16  Study Highlights    Nuclear stress EF: 58%.  The left ventricular ejection fraction is normal (55-65%).  There was no ST segment deviation noted during stress.  The study is normal.  This is a low risk study.  Compared to study of 2016, the large anterior and apical defect has resolved.   Echocardiogram 11/23/2012 Left ventricle: The cavity size was normal. Wall thickness was normal. Systolic function was normal. The estimated ejection fraction was in the range of 50% to 55%. Possible mild hypokinesis of the anteroseptal myocardium. Left ventricular diastolic function parameters were normal. - Mitral valve: Valve area by pressure half-time: 2cm^2. - Atrial septum: No defect or patent foramen ovale was identified.    ASSESSMENT AND PLAN:  1.  Coronary artery disease:  09/12/2012 with LIMA to LAD and SVG to circumflex marginal 1 vessel.  She continues on Bystolic, isosorbide, losartan and aspirin.  He offers no cardiac complaints.  Most recent nuclear medicine stress test June 2018 was negative for ischemia.  2.  PAD: Senting of his left external iliac artery dur to a 90% calcific plaque in the right common femoral artery and a 90% in-stent restenosis and was placed right FSA stent.  He had right common femoral endarterectomy, and patch angioplasty and staged right SFA diamondback orbital rotational atherectomy and drug-eluting balloon angioplasty of his in-stent restenosis within the mid right SFA  stent.  He offers no claudication symptoms.  Continue dual antiplatelet therapy and statin therapy.  3.  History of hypertension: Blood pressure is very well controlled.  No change in his medication.   Current medicines are reviewed at length with the patient today.    Labs/ tests ordered today include: Followed by PCP.  Phill Myron. [place] Pugh, ANP, AACC   06/07/2017 2:00 PM    [place] [address] [place], [place] [address] Phone: [phone]; Fax: [phone]

## 2017-06-14 ENCOUNTER — Other Ambulatory Visit: Payer: Self-pay | Admitting: Cardiovascular Disease

## 2017-06-14 DIAGNOSIS — I25700 Atherosclerosis of coronary artery bypass graft(s), unspecified, with unstable angina pectoris: Secondary | ICD-10-CM

## 2017-06-14 NOTE — Telephone Encounter (Signed)
Rx(s) sent to pharmacy electronically.  

## 2017-08-06 ENCOUNTER — Encounter: Payer: Self-pay | Admitting: Cardiovascular Disease

## 2017-09-10 ENCOUNTER — Ambulatory Visit (INDEPENDENT_AMBULATORY_CARE_PROVIDER_SITE_OTHER)
Admission: RE | Admit: 2017-09-10 | Discharge: 2017-09-10 | Disposition: A | Discharge status: Home / Self Care | Payer: Medicare Other | Source: Ambulatory Visit | Attending: Internal Medicine | Admitting: Internal Medicine

## 2017-09-10 ENCOUNTER — Ambulatory Visit (INDEPENDENT_AMBULATORY_CARE_PROVIDER_SITE_OTHER): Payer: Medicare Other | Admitting: Internal Medicine

## 2017-09-10 ENCOUNTER — Encounter: Payer: Self-pay | Admitting: Internal Medicine

## 2017-09-10 VITALS — BP 130/68 | HR 49 | Ht 69.0 in | Wt 186.4 lb

## 2017-09-10 DIAGNOSIS — J449 Chronic obstructive pulmonary disease, unspecified: Secondary | ICD-10-CM | POA: Diagnosis not present

## 2017-09-10 DIAGNOSIS — Z23 Encounter for immunization: Secondary | ICD-10-CM | POA: Diagnosis not present

## 2017-09-10 DIAGNOSIS — J4540 Moderate persistent asthma, uncomplicated: Secondary | ICD-10-CM

## 2017-09-10 DIAGNOSIS — I25700 Atherosclerosis of coronary artery bypass graft(s), unspecified, with unstable angina pectoris: Secondary | ICD-10-CM

## 2017-09-10 DIAGNOSIS — J4489 Other specified chronic obstructive pulmonary disease: Secondary | ICD-10-CM

## 2017-09-10 DIAGNOSIS — Z720 Tobacco use: Secondary | ICD-10-CM

## 2017-09-10 MED ORDER — FLUTICASONE-UMECLIDIN-VILANT 100-62.5-25 MCG/INH IN AEPB: 1.0000 | INHALATION_SPRAY | Freq: Every day | RESPIRATORY_TRACT | 0 refills | Status: DC

## 2017-09-10 MED ORDER — FLUTICASONE-UMECLIDIN-VILANT 100-62.5-25 MCG/INH IN AEPB: 2.0000 | INHALATION_SPRAY | Freq: Every day | RESPIRATORY_TRACT | 12 refills | Status: DC

## 2017-09-10 MED ORDER — ALBUTEROL SULFATE HFA 108 (90 BASE) MCG/ACT IN AERS: INHALATION_SPRAY | RESPIRATORY_TRACT | 12 refills | Status: DC

## 2017-09-10 NOTE — Assessment & Plan Note (Signed)
We continue to press him to drop the last few cigarettes.  Support offered.

## 2017-09-10 NOTE — Assessment & Plan Note (Signed)
COPD mixed type.  He understands smoking cessation would likely help. Plan-refill rescue inhaler.  Sample trial Trelegy maintenance inhaler.  Education done.,  Update CXR.

## 2017-09-10 NOTE — Patient Instructions (Signed)
Order- Prevnar 13 pneumonia vaccine  Order-CXR   Dx asthmatic bronchitis moderate persistent uncomplicated  Sample and printed script for Trelegy inhaler     Inhale 1 puff, then rinse mouth, once daily  Script printed for Proair albuterol rescue inhaler    Inhale 2 puffs up to every 6 hours if needed

## 2017-09-10 NOTE — Progress Notes (Signed)
HPI  M former smoker (37 pk yrs) followed for  for asthma/ bronchitis complicated by CAD/ CABG, PAD, GERD PFT 02/20/16-  --------------------------------------------------------------------------------------  03/09/2016-72 year old male smoker (55 pack years) followed for asthma/bronchitis, complicated by CAD/CABG, PAFib, GERD FOLLOWS FOR: Pt slight cough-? from sinus. At times can get mucus up with cough. Blames increased postnasal drip with current cold season for increase in dry cough, sometimes scant clear phlegm. Denies shortness of breath or wheeze, discolored sputum or breathing interfering with sleep. Sometimes feels congested if he lies left side down. 4 or 5 cigarettes daily. Has 2 inhalers, sounded like Pro air and Ventolin, used very rarely. These are not on his medication list. Recent stent and balloon angioplasty for leg artery PAD.  CXR 09/25/2015 IMPRESSION: No evidence of acute cardiopulmonary disease. Electronically Signed   By: Julian Hy M.D.   On: 09/25/2015 11:43  09/10/17- 72 year old male smoker (55 pack years) followed for asthma/bronchitis, complicated by CAD/CABG, PAD, PAFib, GERD ----Bronchitis: Pt has flu about 2 weeks ago; states he has SOB at times.  Feels fully recovered from flu like illness.  Using a Proair inhaler occasionally.  Had been using Dulera but even with spacer tube could not prevent thrush. Still smokes few cigarettes daily despite extensive counseling.  Not motivated to change. Occasional cough with scant white sputum.  Denies blood or acute events.  ROS-see HPI + = positive Constitutional:   No-   weight loss, night sweats, fevers, chills, fatigue, lassitude. HEENT:   No-  headaches, difficulty swallowing, tooth/dental problems, sore throat,       No-  sneezing, itching, ear ache, nasal congestion, post nasal drip,  CV:  No-   chest pain, orthopnea, PND, swelling in lower extremities, anasarca, dizziness, palpitations Resp:  +-shortness of breath with exertion or at rest.              +   productive cough,  + non-productive cough,  No- coughing up of blood.              No-   change in color of mucus.  No- wheezing.   Skin: No-   rash or lesions. GI:  No-   heartburn, indigestion, abdominal pain, nausea, vomiting,  GU:  MS:  No-   joint pain or swelling.  . Neuro-     nothing unusual Psych:  No- change in mood or affect. No depression or anxiety.  No memory loss.  OBJ- Physical Exam General- Alert, Oriented, Affect-appropriate, Distress- none acute Skin- rash-none, lesions- none, excoriation- none Lymphadenopathy- none Head- atraumatic            Eyes- Gross vision intact, PERRLA, conjunctivae and secretions clear            Ears- Hearing, canals-normal            Nose- Clear, no-Septal dev, mucus, polyps, erosion, perforation             Throat- Mallampati II , mucosa clear , drainage- none, tonsils- atrophic, dentures Neck- flexible , trachea midline, no stridor , thyroid nl, carotid no bruit Chest - symmetrical excursion , unlabored           Heart/CV- RRR occasional extra beat , no murmur , no gallop  , no rub, nl s1 s2                           - JVD- none , edema- none, stasis changes- none, varices-  none           Lung- clear, wheeze- none, cough- none , dullness-none, rub- none           Chest wall-  Abd-  Br/ Gen/ Rectal- Not done, not indicated Extrem- cyanosis- none, clubbing, none, atrophy- none, strength- nl Neuro- grossly intact to observation

## 2017-09-13 ENCOUNTER — Ambulatory Visit (HOSPITAL_COMMUNITY)
Admission: RE | Admit: 2017-09-13 | Discharge: 2017-09-13 | Disposition: A | Discharge status: Home / Self Care | Payer: Medicare Other | Source: Ambulatory Visit | Attending: Cardiovascular Disease | Admitting: Cardiovascular Disease

## 2017-09-13 ENCOUNTER — Ambulatory Visit (HOSPITAL_BASED_OUTPATIENT_CLINIC_OR_DEPARTMENT_OTHER)
Admission: RE | Admit: 2017-09-13 | Discharge: 2017-09-13 | Disposition: A | Discharge status: Home / Self Care | Payer: Medicare Other | Source: Ambulatory Visit | Attending: Cardiovascular Disease | Admitting: Cardiovascular Disease

## 2017-09-13 DIAGNOSIS — E669 Obesity, unspecified: Secondary | ICD-10-CM | POA: Diagnosis not present

## 2017-09-13 DIAGNOSIS — I70203 Unspecified atherosclerosis of native arteries of extremities, bilateral legs: Secondary | ICD-10-CM | POA: Insufficient documentation

## 2017-09-13 DIAGNOSIS — I2510 Atherosclerotic heart disease of native coronary artery without angina pectoris: Secondary | ICD-10-CM | POA: Diagnosis not present

## 2017-09-13 DIAGNOSIS — I739 Peripheral vascular disease, unspecified: Secondary | ICD-10-CM | POA: Diagnosis present

## 2017-09-13 DIAGNOSIS — I10 Essential (primary) hypertension: Secondary | ICD-10-CM | POA: Insufficient documentation

## 2017-09-13 DIAGNOSIS — I708 Atherosclerosis of other arteries: Secondary | ICD-10-CM | POA: Insufficient documentation

## 2017-09-13 DIAGNOSIS — E785 Hyperlipidemia, unspecified: Secondary | ICD-10-CM | POA: Insufficient documentation

## 2017-09-13 DIAGNOSIS — F17200 Nicotine dependence, unspecified, uncomplicated: Secondary | ICD-10-CM | POA: Diagnosis not present

## 2017-09-13 DIAGNOSIS — Z95828 Presence of other vascular implants and grafts: Secondary | ICD-10-CM | POA: Diagnosis not present

## 2017-09-13 DIAGNOSIS — I700 Atherosclerosis of aorta: Secondary | ICD-10-CM | POA: Diagnosis not present

## 2017-09-16 ENCOUNTER — Other Ambulatory Visit: Payer: Self-pay

## 2017-09-16 DIAGNOSIS — I739 Peripheral vascular disease, unspecified: Secondary | ICD-10-CM

## 2017-10-22 ENCOUNTER — Other Ambulatory Visit: Payer: Self-pay | Admitting: *Deleted

## 2017-10-22 DIAGNOSIS — I739 Peripheral vascular disease, unspecified: Secondary | ICD-10-CM

## 2017-10-29 ENCOUNTER — Telehealth: Payer: Self-pay | Admitting: Gastroenterology

## 2017-10-29 ENCOUNTER — Encounter: Payer: Self-pay | Admitting: Gastroenterology

## 2017-10-29 NOTE — Telephone Encounter (Signed)
Patient states his hemoglobin has dropped and wants to know what to do until his appt on 7.23.19. Pt last seen 2018.

## 2017-11-01 NOTE — Telephone Encounter (Signed)
Left message for pt to call back  °

## 2017-11-02 NOTE — Telephone Encounter (Signed)
I spoke with the patient and he feels that he is more anemic.  He has seen blood in his stool and would like to be seen sooner than his 12/07/17 appointment with Dr Lyndel Safe.  Appointment made for tomorrow with Amy.

## 2017-11-03 ENCOUNTER — Ambulatory Visit (INDEPENDENT_AMBULATORY_CARE_PROVIDER_SITE_OTHER): Payer: Medicare Other | Admitting: Physician Assistant

## 2017-11-03 ENCOUNTER — Other Ambulatory Visit (INDEPENDENT_AMBULATORY_CARE_PROVIDER_SITE_OTHER): Payer: Medicare Other

## 2017-11-03 ENCOUNTER — Encounter: Payer: Self-pay | Admitting: Physician Assistant

## 2017-11-03 VITALS — BP 108/56 | HR 56 | Ht 69.0 in | Wt 188.0 lb

## 2017-11-03 DIAGNOSIS — Z7901 Long term (current) use of anticoagulants: Secondary | ICD-10-CM

## 2017-11-03 DIAGNOSIS — D649 Anemia, unspecified: Secondary | ICD-10-CM

## 2017-11-03 DIAGNOSIS — Z8719 Personal history of other diseases of the digestive system: Secondary | ICD-10-CM

## 2017-11-03 DIAGNOSIS — R195 Other fecal abnormalities: Secondary | ICD-10-CM

## 2017-11-03 DIAGNOSIS — I25700 Atherosclerosis of coronary artery bypass graft(s), unspecified, with unstable angina pectoris: Secondary | ICD-10-CM

## 2017-11-03 LAB — BASIC METABOLIC PANEL
BUN: 23 mg/dL (ref 6–23)
CO2: 29 mEq/L (ref 19–32)
Calcium: 9.2 mg/dL (ref 8.4–10.5)
Chloride: 105 mEq/L (ref 96–112)
Creatinine, Ser: 1.12 mg/dL (ref 0.40–1.50)
GFR: 68.52 mL/min (ref >60.00)
Glucose, Bld: 87 mg/dL (ref 70–99)
Potassium: 4 mEq/L (ref 3.5–5.1)
Sodium: 139 mEq/L (ref 135–145)

## 2017-11-03 LAB — CBC WITH DIFFERENTIAL/PLATELET
Basophils Absolute: 0.1 10*3/uL (ref 0.0–0.1)
Basophils Relative: 0.6 % (ref 0.0–3.0)
Eosinophils Absolute: 0.1 10*3/uL (ref 0.0–0.7)
Eosinophils Relative: 1.4 % (ref 0.0–5.0)
HCT: 40.8 % (ref 39.0–52.0)
Hemoglobin: 13.8 g/dL (ref 13.0–17.0)
Lymphocytes Relative: 30.4 % (ref 12.0–46.0)
Lymphs Abs: 2.7 10*3/uL (ref 0.7–4.0)
MCHC: 33.7 g/dL (ref 30.0–36.0)
MCV: 86.5 fl (ref 78.0–100.0)
Monocytes Absolute: 0.8 10*3/uL (ref 0.1–1.0)
Monocytes Relative: 8.9 % (ref 3.0–12.0)
Neutro Abs: 5.2 10*3/uL (ref 1.4–7.7)
Neutrophils Relative %: 58.7 % (ref 43.0–77.0)
Platelets: 220 10*3/uL (ref 150.0–400.0)
RBC: 4.71 Mil/uL (ref 4.22–5.81)
RDW: 14.6 % (ref 11.5–15.5)
WBC: 8.9 10*3/uL (ref 4.0–10.5)

## 2017-11-03 LAB — IBC PANEL
Iron: 102 ug/dL (ref 42–165)
Saturation Ratios: 27.5 % (ref 20.0–50.0)
Transferrin: 265 mg/dL (ref 212.0–360.0)

## 2017-11-03 LAB — FERRITIN: Ferritin: 135.1 ng/mL (ref 22.0–322.0)

## 2017-11-03 MED ORDER — PANTOPRAZOLE SODIUM 40 MG PO TBEC: 40.0000 mg | DELAYED_RELEASE_TABLET | Freq: Two times a day (BID) | ORAL | 0 refills | Status: AC

## 2017-11-03 NOTE — Progress Notes (Signed)
Subjective:    Patient ID: Brian Brock, male    DOB: Sep 23, 1945, 72 y.o.   MRN: [id number]  HPI Brian Brock is a pleasant 72 year old white male, known previously to Dr. Lyndel Safe who was referred today by Dr. Janace Litten, MD/[facility].  Patient had a recent visit there with complaints of fatigue over the past month.  He had a hemoglobin done late May 2019 which was 14 and this was repeated on 10/28/2017 and was down to 11.4.  He is done Hemoccults on 2 separate occasions both of which were positive. Patient has history of coronary artery disease he is status post numerous stents and then had CABG x2 about 4 years ago.  He has been maintained on Plavix and aspirin long-term.  He also has peripheral vascular disease and had a lower extremity stent done about a year ago is followed by Dr. Claiborne Billings.  Also with history of hypotension, atrial fibrillation, and COPD. He is maintained on Protonix chronically 40 mg for GERD.  He has not been on any regular NSAIDs, does take his baby aspirin daily.  He has not noticed any melena or hematochezia himself.  Thinks his stools usually are somewhat darker if he is constipated.  His bowel habits have been normal.  He has had some vague "light" abdominal discomfort recently off and on.  Appetite is been fine weight has been stable, no heartburn or indigestion no dysphasia no nausea or vomiting. Family history is negative for colon cancer and polyps. He did have EGD and colonoscopy done in June 2018 per Dr. Lyndel Safe.  The EGD showed a small hiatal hernia, there was acute gastritis.  Colonoscopy showed diverticulosis predominantly in the sigmoid colon mild to moderate and second-degree internal hemorrhoids, no polyps biopsies were done to rule out microscopic colitis and these were negative Small bowel biopsies were also done and negative.  His energy level has been significantly decreased over the past couple of weeks, he denies any lightheadedness or dizziness  no shortness of breath or chest pain.  Review of Systems Pertinent positive and negative review of systems were noted in the above HPI section.  All other review of systems was otherwise negative.  Outpatient Encounter Medications as of 11/03/2017  Medication Sig  . albuterol (PROAIR HFA) 108 (90 Base) MCG/ACT inhaler Inhale 2 puffs every 6 hours as needed  . aspirin EC 81 MG tablet Take 81 mg by mouth daily.   [name] BYSTOLIC 5 MG tablet TAKE 1/2 TABLET ONCE DAILY  . cetirizine (ZYRTEC) 10 MG tablet Take 10 mg by mouth daily.   . cholestyramine (QUESTRAN) 4 G packet Take 1 packet by mouth 2 (two) times daily with a meal.  . clopidogrel (PLAVIX) 75 MG tablet TAKE ONE TABLET BY MOUTH EVERY DAY  . isosorbide mononitrate (IMDUR) 30 MG 24 hr tablet TAKE 1/2 TABLET BY MOUTH EVERY DAY  . levothyroxine (SYNTHROID, LEVOTHROID) 25 MCG tablet Take 25 mcg by mouth daily before breakfast.   . losartan (COZAAR) 100 MG tablet TAKE ONE TABLET BY MOUTH EVERY DAY  . nitroGLYCERIN (NITROSTAT) 0.4 MG SL tablet PLACE 1 TABLET UNDER THE TONGUE, MAY REPEAT EVERY 5 MINUTES, MAX: 3 TABLETS.  [name] pantoprazole (PROTONIX) 40 MG tablet Take 1 tablet (40 mg total) by mouth 2 (two) times daily.  . Probiotic Product (PROBIOTIC PO) Take 1 tablet by mouth daily.  . rosuvastatin (CRESTOR) 20 MG tablet TAKE ONE TABLET BY MOUTH EVERY DAY  . zolpidem (AMBIEN) 10 MG tablet  Take 1 tablet by mouth at bedtime as needed.  . [DISCONTINUED] pantoprazole (PROTONIX) 40 MG tablet Take 40 mg by mouth 2 (two) times daily.   . [DISCONTINUED] Fluticasone-Umeclidin-Vilant (TRELEGY ELLIPTA) 100-62.5-25 MCG/INH AEPB Inhale 2 puffs into the lungs daily. Rinse mouth  . [DISCONTINUED] Fluticasone-Umeclidin-Vilant (TRELEGY ELLIPTA) 100-62.5-25 MCG/INH AEPB Inhale 1 puff into the lungs daily.   No facility-administered encounter medications on file as of 11/03/2017.    Allergies  Allergen Reactions  . Metoprolol Other (See Comments)    Fatigue and  malaise  . Tricor [Fenofibrate] Other (See Comments)    Tired/weak  . Fenofibrate Micronized     UNSPECIFIED REACTION   . Levofloxacin     UNSPECIFIED REACTION   . Metaxalone Other (See Comments)    Unknown reaction  . Niaspan [Niacin]     UNSPECIFIED REACTION   . Fish Oil Rash  . Meloxicam Other (See Comments)    Achy, fatigue  . Sulfadiazine Hives and Rash   Patient Active Problem List   Diagnosis Date Noted  . Femoral hernia of right side 12/28/2016  . PAD (peripheral artery disease) ([place]) 12/19/2015  . Claudication ([place]) 09/11/2015  . Obstructive chronic bronchitis without exacerbation ([place]) 03/09/2015  . CAD (coronary artery disease) of artery bypass graft 07/24/2014  . Coronary artery disease involving coronary bypass graft with other forms of angina pectoris   . Cardiovascular stress test abnormal 07/17/2014  . Hyperlipidemia LDL goal <70 02/15/2014  . Palpitations 02/15/2014  . Metabolic syndrome 09/38/1829  . HTN (hypertension) 04/15/2013  . Preoperative clearance 04/15/2013  . Bradycardia 11/04/2012  . Dizziness 11/04/2012  . PAF (paroxysmal atrial fibrillation) ([place]) 11/04/2012  . Hypotension- B/P 93-71- systolic 69/67/8938  . CAD (coronary artery disease), native coronary artery - Severe Ostial & Proximal LAD ~90%; Ostial Circumflex ostial ~60%; Plan CABG 09/10/2012  . Elevated LFTs 09/06/2012  .  EKG abnormality- transient TWI 09/06/2012  . PVD- Lt SFA stent '04, Rt SFA stent '08 09/06/2012  . CAD- CABG X 17 August 2012 09/05/2012  . Unstable angina ([place]) 09/05/2012  . DYSPNEA 06/26/2009  . Hyperlipidemia 05/23/2009  . Tobacco use 05/23/2009  . Bronchitis, chronic obstructive, with exacerbation ([place]) 05/23/2009   Social History   Socioeconomic History  . Marital status: Married    Spouse name: Not on file  . Number of children: Not on file  . Years of education: Not on file  . Highest education level: Not on file  Occupational History  . Not on file    Social Needs  . Financial resource strain: Not on file  . Food insecurity:    Worry: Not on file    Inability: Not on file  . Transportation needs:    Medical: Not on file    Non-medical: Not on file  Tobacco Use  . Smoking status: Current Every Day Smoker    Packs/day: 0.50    Years: 56.00    Pack years: 28.00    Types: Cigarettes    Last attempt to quit: 03/25/2016    Years since quitting: 1.6  . Smokeless tobacco: Former Systems developer    Types: Chew  Substance and Sexual Activity  . Alcohol use: Yes    Comment: 09/26/2015 "varies; might have a few drinks q 2 months"  . Drug use: No  . Sexual activity: Not Currently  Lifestyle  . Physical activity:    Days per week: Not on file    Minutes per session: Not on file  . Stress: Not  on file  Relationships  . Social connections:    Talks on phone: Not on file    Gets together: Not on file    Attends religious service: Not on file    Active member of club or organization: Not on file    Attends meetings of clubs or organizations: Not on file    Relationship status: Not on file  . Intimate partner violence:    Fear of current or ex partner: Not on file    Emotionally abused: Not on file    Physically abused: Not on file    Forced sexual activity: Not on file  Other Topics Concern  . Not on file  Social History Narrative  . Not on file    [name]'s family history includes Coronary artery disease (age of onset: 55) in his father; Coronary artery disease (age of onset: 22) in his brother; Heart attack in his father.      Objective:    Vitals:   11/03/17 1502  BP: (!) 108/56  Pulse: (!) 56    Physical Exam; well-developed older white male in no acute distress, accompanied by his wife both pleasant blood pressure 108/56 pulse 56, height 5 foot 9, weight 188, BMI 27.7.  HEENT; nontraumatic normocephalic EOMI PERRLA sclera anicteric, Oropharynx benign, Cardiovascular; regular rate and rhythm with S1-S2 no murmur rub or  gallop sternal incisional scar, Pulmonary clear bilaterally, Abdomen ;soft, nontender there is no palpable mass or hepatosplenomegaly bowel sounds are present, Rectal ;exam not done recently documented Hemoccult positive x2.  Extremities; no clubbing cyanosis or edema skin warm and dry, Neuro psych ;alert and oriented, grossly nonfocal mood and affect appropriate       Assessment & Plan:   #41 72 year old white male, on chronic antiplatelet therapy with Plavix and aspirin with complaints of fatigue over the past few weeks, associated drop in hemoglobin from 14-11.4 and previously documented Hemoccult positive stool. Patient had gastritis documented on EGD about 1 year ago.  Rule out low-grade GI bleeding secondary to acute gastropathy, peptic ulcer disease, rule out AVMs.  Bleeding less likely colonic in etiology with negative colonoscopy 1 year ago.  #2 coronary artery disease status post numerous stents, status post CABG 4 years ago #3.  Peripheral vascular disease status post lower extremity stent 1 year ago #4.  History of hypotension #5.  Atrial fibrillation #6.  COPD  Plan; Repeat CBC today, check iron studies Increase Protonix to 40 mg p.o. twice daily Patient will be scheduled for EGD with Dr. Lyndel Safe tomorrow .  Procedure was discussed in detail with patient including indications risks and benefits and he is agreeable to proceed.  This will be diagnostic as he has been on Plavix, have asked him to hold Plavix in a.m. if his hemoglobin has dropped significantly we may need to take him off of Plavix short-term. If EGD unremarkable consider capsule endoscopy.  Kasie Leccese Genia Harold PA-C 11/03/2017   Cc: [name], MD

## 2017-11-03 NOTE — Patient Instructions (Signed)
Your provider has requested that you go to the basement level for lab work before leaving today. Press "B" on the elevator. The lab is located at the first door on the left as you exit the elevator. Increase the Pantoprazole sodium 40 mg to 1 tab by mouth twice daily for 1 month.  You have been scheduled for an endoscopy. Please follow written instructions given to you at your visit today. If you use inhalers (even only as needed), please bring them with you on the day of your procedure. Your physician has requested that you go to www.startemmi.com and enter the access code given to you at your visit today. This web site gives a general overview about your procedure. However, you should still follow specific instructions given to you by our office regarding your preparation for the procedure.

## 2017-11-04 ENCOUNTER — Ambulatory Visit (AMBULATORY_SURGERY_CENTER): Payer: Medicare Other | Admitting: Gastroenterology

## 2017-11-04 ENCOUNTER — Encounter: Payer: Self-pay | Admitting: Gastroenterology

## 2017-11-04 ENCOUNTER — Other Ambulatory Visit: Payer: Self-pay

## 2017-11-04 VITALS — BP 99/60 | HR 52 | Temp 97.8°F | Resp 13 | Ht 69.0 in | Wt 188.0 lb

## 2017-11-04 DIAGNOSIS — R195 Other fecal abnormalities: Secondary | ICD-10-CM | POA: Diagnosis not present

## 2017-11-04 DIAGNOSIS — D649 Anemia, unspecified: Secondary | ICD-10-CM

## 2017-11-04 MED ORDER — SODIUM CHLORIDE 0.9 % IV SOLN: 500.0000 mL | INTRAVENOUS | Status: DC

## 2017-11-04 NOTE — Patient Instructions (Signed)
Impression/Recommendations:  Hiatal hernia handout given to patient. Copy of labs from yesterday given to patient.  Resume previous diet. Continue present medications.  Return to Dr. Steve Rattler office in 8 weeks.  YOU HAD AN ENDOSCOPIC PROCEDURE TODAY AT [facility]:   Refer to the procedure report that was given to you for any specific questions about what was found during the examination.  If the procedure report does not answer your questions, please call your gastroenterologist to clarify.  If you requested that your care partner not be given the details of your procedure findings, then the procedure report has been included in a sealed envelope for you to review at your convenience later.  YOU SHOULD EXPECT: Some feelings of bloating in the abdomen. Passage of more gas than usual.  Walking can help get rid of the air that was put into your GI tract during the procedure and reduce the bloating. If you had a lower endoscopy (such as a colonoscopy or flexible sigmoidoscopy) you may notice spotting of blood in your stool or on the toilet paper. If you underwent a bowel prep for your procedure, you may not have a normal bowel movement for a few days.  Please Note:  You might notice some irritation and congestion in your nose or some drainage.  This is from the oxygen used during your procedure.  There is no need for concern and it should clear up in a day or so.  SYMPTOMS TO REPORT IMMEDIATELY:  Following upper endoscopy (EGD)  Vomiting of blood or coffee ground material  New chest pain or pain under the shoulder blades  Painful or persistently difficult swallowing  New shortness of breath  Fever of 100F or higher  Black, tarry-looking stools  For urgent or emergent issues, a gastroenterologist can be reached at any hour by calling [phone].   DIET:  We do recommend a small meal at first, but then you may proceed to your regular diet.  Drink plenty of fluids but you  should avoid alcoholic beverages for 24 hours.  ACTIVITY:  You should plan to take it easy for the rest of today and you should NOT DRIVE or use heavy machinery until tomorrow (because of the sedation medicines used during the test).    FOLLOW UP: Our staff will call the number listed on your records the next business day following your procedure to check on you and address any questions or concerns that you may have regarding the information given to you following your procedure. If we do not reach you, we will leave a message.  However, if you are feeling well and you are not experiencing any problems, there is no need to return our call.  We will assume that you have returned to your regular daily activities without incident.  If any biopsies were taken you will be contacted by phone or by letter within the next 1-3 weeks.  Please call us at [phone] if you have not heard about the biopsies in 3 weeks.    SIGNATURES/CONFIDENTIALITY: You and/or your care partner have signed paperwork which will be entered into your electronic medical record.  These signatures attest to the fact that that the information above on your After Visit Summary has been reviewed and is understood.  Full responsibility of the confidentiality of this discharge information lies with you and/or your care-partner.

## 2017-11-04 NOTE — Progress Notes (Signed)
A/ox3, pleased with MAC, report to [employer]

## 2017-11-04 NOTE — Op Note (Signed)
Brian Brock Patient Name: Brian Brock Procedure Date: 11/04/2017 11:17 AM MRN: [id number] Endoscopist: Jackquline Denmark , MD Age: 72 Referring MD:  Date of Birth: [birth year] Gender: Male Account #: [id number] Procedure:                Upper GI endoscopy Indications:              Heme positive stool Medicines:                Monitored Anesthesia Care Procedure:                Pre-Anesthesia Assessment:                           - Prior to the procedure, a History and Physical                            was performed, and patient medications and                            allergies were reviewed. The patient is competent.                            The risks and benefits of the procedure and the                            sedation options and risks were discussed with the                            patient. All questions were answered and informed                            consent was obtained. Patient identification and                            proposed procedure were verified by the physician                            in the procedure room. Mental Status Examination:                            alert and oriented. Prophylactic Antibiotics: The                            patient does not require prophylactic antibiotics.                            Prior Anticoagulants: The patient has taken Plavix                            (clopidogrel), last dose was 1 day prior to                            procedure. ASA Grade Assessment: II - A patient  with mild systemic disease. After reviewing the                            risks and benefits, the patient was deemed in                            satisfactory condition to undergo the procedure.                            The anesthesia plan was to use monitored anesthesia                            care (MAC). Immediately prior to administration of                            medications, the patient was  re-assessed for                            adequacy to receive sedatives. The heart rate,                            respiratory rate, oxygen saturations, blood                            pressure, adequacy of pulmonary ventilation, and                            response to care were monitored throughout the                            procedure. The physical status of the patient was                            re-assessed after the procedure.                           After obtaining informed consent, the endoscope was                            passed under direct vision. Throughout the                            procedure, the patient's blood pressure, pulse, and                            oxygen saturations were monitored continuously. The                            Model GIF-[id number]) scope was introduced                            through the mouth, and advanced to the second part  of duodenum. The upper GI endoscopy was                            accomplished without difficulty. The patient                            tolerated the procedure well. Scope In: Scope Out: Findings:                 A small transient hiatal hernia was present.                           The exam was otherwise without abnormality. Complications:            No immediate complications. Estimated Blood Loss:     Estimated blood loss: none. Impression:               - Small hiatal hernia.                           - The examination was otherwise normal. No active                            bleeding. Recommendation:           - Patient has a contact number available for                            emergencies. The signs and symptoms of potential                            delayed complications were discussed with the                            patient. Return to normal activities tomorrow.                            Written discharge instructions were provided to the                             patient.                           - Resume previous diet.                           - Continue present medications.                           - Return to my office in 8 weeks.                           - CBC checked yesterday revealed hemoglobin to be                            13.8. Will give copy of the labs to the patient.                           -  If in the future if he continues to have heme                            positive stools with anemia, then would consider                            repeat colonoscopy possibly followed by capsule                            endoscopy. Jackquline Denmark, MD 11/04/2017 11:38:26 AM This report has been signed electronically.

## 2017-11-05 ENCOUNTER — Telehealth: Payer: Self-pay | Admitting: *Deleted

## 2017-11-05 NOTE — Telephone Encounter (Signed)
  Follow up Call-  Call back number 11/04/2017  Post procedure Call Back phone  # [phone]  Permission to leave phone message Yes  Some recent data might be hidden     Patient questions:  Do you have a fever, pain , or abdominal swelling? No. Pain Score  0 *  Have you tolerated food without any problems? Yes.    Have you been able to return to your normal activities? Yes.    Do you have any questions about your discharge instructions: Diet   No. Medications  No. Follow up visit  No.  Do you have questions or concerns about your Care? No.  Actions: * If pain score is 4 or above: No action needed, pain <4.

## 2017-12-06 ENCOUNTER — Encounter: Payer: Self-pay | Admitting: Gastroenterology

## 2017-12-07 ENCOUNTER — Other Ambulatory Visit (INDEPENDENT_AMBULATORY_CARE_PROVIDER_SITE_OTHER): Payer: Medicare Other

## 2017-12-07 ENCOUNTER — Ambulatory Visit (INDEPENDENT_AMBULATORY_CARE_PROVIDER_SITE_OTHER): Payer: Medicare Other | Admitting: Gastroenterology

## 2017-12-07 ENCOUNTER — Encounter

## 2017-12-07 ENCOUNTER — Encounter: Payer: Self-pay | Admitting: Gastroenterology

## 2017-12-07 VITALS — BP 122/62 | HR 54 | Ht 69.0 in | Wt 189.5 lb

## 2017-12-07 DIAGNOSIS — R195 Other fecal abnormalities: Secondary | ICD-10-CM

## 2017-12-07 DIAGNOSIS — K219 Gastro-esophageal reflux disease without esophagitis: Secondary | ICD-10-CM | POA: Diagnosis not present

## 2017-12-07 NOTE — Progress Notes (Signed)
IMPRESSION and PLAN:    #1. GERD with small HH - Decrease protonix 40mg  po qd.    #2.  Heme positive stools (on ASA and plavix) with normal CBC. Neg EGD 11/04/2017. -Recheck CBC -Recheck Hemoccult stools. -If stools are positive, repeat colonoscopy with a 2-day prep. -If he continues to have problems, would proceed with further work-up by means of CT scan of the abdomen pelvis followed by a capsule endoscopy. -Discussed in detail with the patient.      HPI:    Chief Complaint:   Brian Brock is a 72 y.o. male  on chronic antiplatelet therapy with Plavix and aspirin with previously documented Hemoccult positive stool. Negative EGD 11/04/2017  Normal CBC with hemoglobin of 13.8  Negative colonoscopy 1 year ago.  No records available yet. No nausea, vomiting, heartburn, regurgitation, odynophagia or dysphagia.  No significant diarrhea or constipation.  There is no melena or hematochezia. No unintentional weight loss. Patient feels much better.    Past Medical History:  Diagnosis Date  . AF (atrial fibrillation) ([place])    happened after CABG, has not had it since  . Allergic rhinitis   . Allergy   . Anginal pain ([place]) 1997  . Arthritis    "lower back" (09/26/2015)  . Childhood asthma   . Chronic bronchitis ([place])    Yearly  . COPD (chronic obstructive pulmonary disease) ([place])   . Coronary artery disease    Multiple stents  . Dyslipidemia   . Dysrhythmia   . Femoral hernia of right side 12/28/2016  . GERD (gastroesophageal reflux disease)   . History of hiatal hernia   . Hyperlipemia   . Hypertension   . Hypothyroidism   . Irritable bowel syndrome   . Myocardial infarct ([place]) 1997   "mild"  . Peripheral vascular disease ([place])    prior stenting  . Restless legs     Current Outpatient Medications  Medication Sig Dispense Refill  . albuterol (PROAIR HFA) 108 (90 Base) MCG/ACT inhaler Inhale 2 puffs every 6 hours as needed 1 Inhaler 12  . aspirin EC 81 MG  tablet Take 81 mg by mouth daily.     [name] BYSTOLIC 5 MG tablet TAKE 1/2 TABLET ONCE DAILY 30 tablet 6  . cetirizine (ZYRTEC) 10 MG tablet Take 10 mg by mouth daily.     . cholestyramine (QUESTRAN) 4 G packet Take 1 packet by mouth 2 (two) times daily with a meal. 60 each 11  . clopidogrel (PLAVIX) 75 MG tablet TAKE ONE TABLET BY MOUTH EVERY DAY 90 tablet 3  . isosorbide mononitrate (IMDUR) 30 MG 24 hr tablet TAKE 1/2 TABLET BY MOUTH EVERY DAY 45 tablet 2  . levothyroxine (SYNTHROID, LEVOTHROID) 25 MCG tablet Take 25 mcg by mouth daily before breakfast.     . losartan (COZAAR) 100 MG tablet TAKE ONE TABLET BY MOUTH EVERY DAY (Patient taking differently: TAKE ONE HALF TABLET BY MOUTH EVERY DAY) 30 tablet 11  . nitroGLYCERIN (NITROSTAT) 0.4 MG SL tablet PLACE 1 TABLET UNDER THE TONGUE, MAY REPEAT EVERY 5 MINUTES, MAX: 3 TABLETS. 25 tablet 5  . pantoprazole (PROTONIX) 40 MG tablet Take 1 tablet (40 mg total) by mouth 2 (two) times daily. 60 tablet 0  . Probiotic Product (PROBIOTIC PO) Take 1 tablet by mouth daily.    . rosuvastatin (CRESTOR) 20 MG tablet TAKE ONE TABLET BY MOUTH EVERY DAY 90 tablet 3  . zolpidem (AMBIEN) 10 MG tablet Take 1 tablet  by mouth at bedtime as needed.     Current Facility-Administered Medications  Medication Dose Route Frequency Provider Last Rate Last Dose  . 0.9 %  sodium chloride infusion  500 mL Intravenous Continuous Jackquline Denmark, MD        Past Surgical History:  Procedure Laterality Date  . CARDIAC CATHETERIZATION  2004   had remote tandem stents in 1997 and 1998 for LAD lesions, RCA stent in 1997 , midprto=ion, and a proximal RCA  SENT IN 1999, all DES; some were IVIS  guide.  [name] CARDIAC CATHETERIZATION  09/07/2012   demonstrated severe 2- vessel disease,patent LAD,patient circ and patent right stents, but 90% ostial LAD whic was new.  . CAROTID DOPPLER  100/24/11   RGT BULB and PROXIMAL ICA 0-49%; LFT. ICA  0-49 ; RGT SUBCLAVIAN ARTERY < 50% ; LFT SUBCLAVIAN  normal patency; RGT  and LFT  VERTEBRAL ARTERIES  . COLONOSCOPY  11/06/2016   Diverticulosis of colon. Internal hemorrhoids.   . CORONARY ANGIOPLASTY    . CORONARY ANGIOPLASTY WITH STENT PLACEMENT  '97, '98, '04, '06, '08, '10   seven cardiac stents  . CORONARY ARTERY BYPASS GRAFT N/A 09/12/2012   Procedure: OFF PUMP CORONARY ARTERY BYPASS GRAFTING (CABG);  Surgeon: Melrose Nakayama, MD;  Location: [facility];  Service: Open Heart Surgery;  Laterality: N/A;  Times two using left internal mammary artery and endoscopically harvested right saphenous vein  ;LIMA-LAD and SVG -OM1  . DOPPLER ECHOCARDIOGRAPHY  11/23/2012,09/30/2010    LV EF 50-55%----09/30/2010-LV EF = >55%  . ENDARTERECTOMY FEMORAL Right 12/19/2015   Procedure: ENDARTERECTOMY RIGHT FEMORAL ARTERY;  Surgeon: Serafina Mitchell, MD;  Location: [facility];  Service: Vascular;  Laterality: Right;  . event monitor  11/04/2012-11/17/2012   pt denies this hx on 07/24/2014  . FEMORAL ARTERY STENT Bilateral 7/06  8/08   Lt SFA '06, Rt SFA 8/08  . HERNIA REPAIR  10/2008   "repaired w/gallbladder OR"  . HERNIA REPAIR  07/2016  . INGUINAL HERNIA REPAIR Right 12/28/2016   Procedure: OPEN REPAIR RIGHT GROIN HERNIA WITH MESH;  Surgeon: Fanny Skates, MD;  Location: [facility];  Service: General;  Laterality: Right;  . LAPAROSCOPIC CHOLECYSTECTOMY  June 2010  . LEFT HEART CATHETERIZATION WITH CORONARY ANGIOGRAM N/A 09/07/2012   Procedure: LEFT HEART CATHETERIZATION WITH CORONARY ANGIOGRAM;  Surgeon: Troy Sine, MD;  Location: [facility] CATH LAB;  Service: Cardiovascular;  Laterality: N/A;  . LEFT HEART CATHETERIZATION WITH CORONARY/GRAFT ANGIOGRAM N/A 07/24/2014   Procedure: LEFT HEART CATHETERIZATION WITH Beatrix Fetters;  Surgeon: Troy Sine, MD; LAD 70%, 50% ISR, mLAD 99%, CFX 30%, patent stent, RCA patent, LIMA-LAD occluded, SVG-OM 1 patent, EF 50-55%  . LEXISCAN STRESS TEST  09/06/2012  . LOWER ARTERIAL DOPPLER  03/25/2012   FGT CIA 0-49%; RGT CFA AND  SFA AT STENT 0-49%; RGT DISTAL SFA = <50%; LFT. EIA  50-69%; LFT. CFA 0-49% ;;LFT. SFA  AT STENT NORMAL PATENCY  . NM MYOVIEW LTD  05/06/2012   EF -STUDY NOT GATED, NORMAL STRESS NUCLEAR ,NORMAL LV FUNCTION  . PATCH ANGIOPLASTY Right 12/19/2015   Procedure: PATCH ANGIOPLASTY RIGHT FEMORAL ARTERY;  Surgeon: Serafina Mitchell, MD;  Location: [facility];  Service: Vascular;  Laterality: Right;  . PERCUTANEOUS CORONARY STENT INTERVENTION (PCI-S) Right 07/24/2014   Procedure: PERCUTANEOUS CORONARY STENT INTERVENTION (PCI-S);  Surgeon: Troy Sine, MD; ostial LAD PTCA with angioscope scoring balloon, 3.0 x 34 mm Resolute DES extending from the ostium to the mid LAD, covering  all significant stenoses   . PERIPHERAL VASCULAR CATHETERIZATION N/A 09/26/2015   Procedure: Abdominal Aortogram w/Lower Extremity;  Surgeon: Lorretta Harp, MD;  Location: [facility];  Service: Cardiovascular;  Laterality: N/A;  . PERIPHERAL VASCULAR CATHETERIZATION Right 02/20/2016   Procedure: Peripheral Vascular Atherectomy;  Surgeon: Lorretta Harp, MD;  Location: [facility];  Service: Cardiovascular;  Laterality: Right;  SFA  . PERIPHERAL VASCULAR CATHETERIZATION Right 02/20/2016   Procedure: Peripheral Vascular Balloon Angioplasty;  Surgeon: Lorretta Harp, MD;  Location: [facility];  Service: Cardiovascular;  Laterality: Right;  SFA  . TONSILLECTOMY  1950's  . UPPER GASTROINTESTINAL ENDOSCOPY      Family History  Problem Relation Age of Onset  . Coronary artery disease Father 65  . Heart attack Father   . Coronary artery disease Brother 40       CABG in 2000  . Colon cancer Neg Hx   . Esophageal cancer Neg Hx   . Rectal cancer Neg Hx   . Stomach cancer Neg Hx     Social History   Tobacco Use  . Smoking status: Current Every Day Smoker    Packs/day: 0.50    Years: 56.00    Pack years: 28.00    Types: Cigarettes    Last attempt to quit: 03/25/2016    Years since quitting: 1.7  . Smokeless  tobacco: Former Systems developer    Types: Chew  Substance Use Topics  . Alcohol use: Yes    Alcohol/week: 0.6 oz    Types: 1 Standard drinks or equivalent per week    Comment: 09/26/2015 "varies; might have a few drinks q 2 months"  . Drug use: No    Allergies  Allergen Reactions  . Metoprolol Other (See Comments)    Fatigue and malaise  . Tricor [Fenofibrate] Other (See Comments)    Tired/weak  . Fenofibrate Micronized     UNSPECIFIED REACTION   . Levofloxacin     UNSPECIFIED REACTION   . Metaxalone Other (See Comments)    Unknown reaction  . Niaspan [Niacin]     UNSPECIFIED REACTION   . Fish Oil Rash  . Meloxicam Other (See Comments)    Achy, fatigue  . Sulfadiazine Hives and Rash     Review of Systems: All systems reviewed and negative except where noted in HPI.    Physical Exam:     BP 122/62   Pulse (!) 54   Ht 5\' 9"  (1.753 m)   Wt 189 lb 8 oz (86 kg)   BMI 27.98 kg/m  @WEIGHTLAST3 @ GENERAL:  Alert, oriented, cooperative, not in acute distress. PSYCH: :Pleasant, normal mood and affect. HEENT:  conjunctiva pink, mucous membranes moist, neck supple without masses. No jaundice. CARDIAC:  S1 S2 normal. No murmers. PULM: Normal respiratory effort, lungs CTA bilaterally, no wheezing. ABDOMEN: Inspection: No visible peristalsis, no abnormal pulsations, skin normal.  Palpation/percussion: Soft, nontender, nondistended, no rigidity, no abnormal dullness to percussion, no hepatosplenomegaly and no palpable abdominal masses.  Auscultation: Normal bowel sounds, no abdominal bruits. Rectal exam: Deferred SKIN:  turgor, no lesions seen. Musculoskeletal:  Normal muscle tone, normal strength. NEURO: Alert and oriented x 3, no focal neurologic deficits.   Jassmine Vandruff,MD 12/07/2017, 3:22 PM   CC [name], MD

## 2017-12-07 NOTE — Patient Instructions (Signed)
If you are age 72 or older, your body mass index should be between 23-30. Your Body mass index is 27.98 kg/m. If this is out of the aforementioned range listed, please consider follow up with your Primary Care Provider.  If you are age 12 or younger, your body mass index should be between 19-25. Your Body mass index is 27.98 kg/m. If this is out of the aformentioned range listed, please consider follow up with your Primary Care Provider.   Please stop at the lab before you leave the office today.   Thank you,  Dr. Jackquline Denmark

## 2017-12-08 LAB — CBC WITH DIFFERENTIAL/PLATELET
Basophils Absolute: 0 10*3/uL (ref 0.0–0.1)
Basophils Relative: 0.4 % (ref 0.0–3.0)
Eosinophils Absolute: 0.1 10*3/uL (ref 0.0–0.7)
Eosinophils Relative: 1.9 % (ref 0.0–5.0)
HCT: 41.4 % (ref 39.0–52.0)
Hemoglobin: 13.8 g/dL (ref 13.0–17.0)
Lymphocytes Relative: 28.7 % (ref 12.0–46.0)
Lymphs Abs: 2 10*3/uL (ref 0.7–4.0)
MCHC: 33.4 g/dL (ref 30.0–36.0)
MCV: 89.2 fl (ref 78.0–100.0)
Monocytes Absolute: 0.5 10*3/uL (ref 0.1–1.0)
Monocytes Relative: 6.7 % (ref 3.0–12.0)
Neutro Abs: 4.4 10*3/uL (ref 1.4–7.7)
Neutrophils Relative %: 62.3 % (ref 43.0–77.0)
Platelets: 226 10*3/uL (ref 150.0–400.0)
RBC: 4.64 Mil/uL (ref 4.22–5.81)
RDW: 14.3 % (ref 11.5–15.5)
WBC: 7.1 10*3/uL (ref 4.0–10.5)

## 2017-12-30 ENCOUNTER — Other Ambulatory Visit: Payer: Medicare Other

## 2017-12-30 DIAGNOSIS — R195 Other fecal abnormalities: Secondary | ICD-10-CM

## 2017-12-30 DIAGNOSIS — K219 Gastro-esophageal reflux disease without esophagitis: Secondary | ICD-10-CM

## 2017-12-30 LAB — HEMOCCULT SLIDES (X 3 CARDS)
Fecal Occult Blood: NEGATIVE
OCCULT 1: NEGATIVE
OCCULT 2: NEGATIVE
OCCULT 3: NEGATIVE
OCCULT 4: NEGATIVE
OCCULT 5: NEGATIVE

## 2018-02-02 ENCOUNTER — Ambulatory Visit (INDEPENDENT_AMBULATORY_CARE_PROVIDER_SITE_OTHER): Payer: Medicare Other | Admitting: Cardiovascular Disease

## 2018-02-02 ENCOUNTER — Encounter: Payer: Self-pay | Admitting: Cardiovascular Disease

## 2018-02-02 VITALS — BP 128/70 | HR 72 | Ht 69.0 in | Wt 187.2 lb

## 2018-02-02 DIAGNOSIS — I739 Peripheral vascular disease, unspecified: Secondary | ICD-10-CM

## 2018-02-02 DIAGNOSIS — I10 Essential (primary) hypertension: Secondary | ICD-10-CM

## 2018-02-02 DIAGNOSIS — I2510 Atherosclerotic heart disease of native coronary artery without angina pectoris: Secondary | ICD-10-CM

## 2018-02-02 DIAGNOSIS — E785 Hyperlipidemia, unspecified: Secondary | ICD-10-CM

## 2018-02-02 DIAGNOSIS — Z79899 Other long term (current) drug therapy: Secondary | ICD-10-CM | POA: Diagnosis not present

## 2018-02-02 DIAGNOSIS — I25700 Atherosclerosis of coronary artery bypass graft(s), unspecified, with unstable angina pectoris: Secondary | ICD-10-CM

## 2018-02-02 NOTE — Progress Notes (Signed)
Patient ID: Brian Brock, male   DOB: 06/21/1945, 72 y.o.   MRN: [id number]     HPI: Brian Brock is a 72 y.o. male who is a former patient of Dr. Terance Ice.  I last saw him in June 2018.  He presents for a 56-monthfollow-up evaluation.  Mr. CTartehas known CAD, hypertension, hyperlipidemia, hypothyroidism, peripheral vascular disease,probable metabolic syndrome and ongoing tobacco use. He has undergone prior percutaneous coronary interventions with stenting to his LAD and RCA  in 1997, 1998, and in 2004 to his proximal left circumflex coronary artery. He also has established PVD and underwent stenting to his left SFA in 2004 and right SFA in 2008. There is a history of tobacco use, hyperlipidemia and [facility]  A nuclear perfusion study demonstrated reversible ischemia in the apical segment of the anterior wall. He underwent repeat cardiac catheterization on 09/07/2012 by me and was found to have preserved LV function with minimal focal region of mild inferior hypocontractility. He had significant progression of CAD with ostial 90% eccentric stenosis of the LAD extending into the distal left main proximal to a previously placed proximal LAD stent. There also is 90% stenosis of the LAD after the stented segment. The circumflex vessel is 60% ostial stenosis followed by 60% proximal stenosis prior to the previously placed stent. The RCA stent was patent.  CABG surgery was recommended and this was done by Dr. HRoxan Hockeyon 09/12/2012 with an off-pump LIMA to LAD and SVG to circumflex marginal 1 vessel.  In 2014 Mr. Grewell had rotator cuff surgery. He  tolerated this well. He does have a history of peripheral vascular disease and is status post right common femoral and proximal SFA stenting with left SFA stenting. A followup  duplex study on 04/18/2013 demonstrated ABIs were excellent bilaterally at 1.1 with less  than 49% diameter reduction noted in the right common iliac, right common femoral and SFA stent, and was less than 50% diameter reduction in the right distal SFA. There was 50-69% diameter reduction in the left external iliac artery and less than 49% and left common femoral. The left SFA stent was widely patent.  In February 2016 he developed an episode of increasing shortness of breath associated with diaphoresis.  Two weeks later in the cold weather he developed severe chest tightness with shortness of breath and blood pressure elevation to 1099systolically.  He was seen by BTarri Fulleron 07/06/2014 after he had again developed some chest pain while moving 40 pounds of bags of salt that he had put in a wheelbarrow.  I reviewed his office note. A nuclear perfusion study which was done on 07/13/2014.  This was significantly changed from his prior study and now was felt to be high risk with a large severe partially reversible anterior apical defect consistent with prior apical infarct, but now with evidence for severe ischemia in the distal anterior wall and apex.  There was apical hypokinesis.  Ejection fraction was 52%.  He was started on nitrates and presented to my office in follow-up.  I recommended definitive cardiac catheterization and on 07/24/2014.  This was performed revealed multivessel native CAD was 70% ostial LAD stenosis, 40-50% stenosis in the proximal LAD stent in 1995% stenosis after the stented segment in the native LAD with 60% ostial septal perforating artery stenosis.  There was 20 and 30% proximal circumflex stenoses with a widely patent circumflex stent  and had a widely patent proximal and mid RCA stents.  His LIMA graft was occluded in its midsegment prior to its anastomosis into the LAD.  He had widely patent vein graft supplying the distal circumflex marginal vessel.  He underwent successful PCI to the LAD at its ostium and in its midsegment treated with angioscope scoring balloon,  insertion of a 3.034 mm Resolute DES stent extending from the ostium to the mid LAD, postdilated 3.41 mm with entire region been reduced to 0%.  In May 2017 he underwent peripheral angiography and stenting of his left external iliac artery.  He also had a 90% calcific plaque in the right common femoral artery and 90% in-stent restenosis with in his replaced right SFA stent.  He underwent right common femoral endarterectomy and patch angioplasty by Dr. Velta Addison in August 2017 which moderately improved.  His claudication and in September underwent staged right SFA diamondback orbital rotational atherectomy and drug-eluting balloon angioplasty of his in-stent restenosis within the mid right SFA  stent.  Subsequently, his claudication has improved.  A follow-up lower extremity Doppler study shows significantly improved ABIs at 0.93 on the right and 1.0 on the left  I last saw him in June 2018 at which time he denied any exertional chest pain or shortness of breath.  He was in need to undergo colonoscopy as well as endoscopy and was planning to undergo right inguinal hernia surgery.    Since I last saw him, he has continued to feel well.  He underwent a preoperative nuclear stress test in June 2018 which showed normal systolic function without ECG changes and normal perfusion.  Compared to a prior study of 2016 the previous anteroapical defect had resolved.  He had experienced 2 very brief short-lived episodes of chest pain over this time.  He admits at times he has burning in his legs.  He has continued to be on losartan 50 mg isosorbide 15 mg Bystolic 2.5 mg for blood pressure and his CAD.  He continues to be on dual antiplatelet therapy with aspirin and Plavix.  He is on rosuvastatin for hyperlipidemia.  He had undergone his hernia surgery and tolerated this well.  He presents for evaluation.  Past Medical History:  Diagnosis Date  . AF (atrial fibrillation) ([place])    happened after CABG, has not had it  since  . Allergic rhinitis   . Allergy   . Anginal pain ([place]) 1997  . Arthritis    "lower back" (09/26/2015)  . Childhood asthma   . Chronic bronchitis ([place])    Yearly  . COPD (chronic obstructive pulmonary disease) ([place])   . Coronary artery disease    Multiple stents  . Dyslipidemia   . Dysrhythmia   . Femoral hernia of right side 12/28/2016  . GERD (gastroesophageal reflux disease)   . History of hiatal hernia   . Hyperlipemia   . Hypertension   . Hypothyroidism   . Irritable bowel syndrome   . Myocardial infarct ([place]) 1997   "mild"  . Peripheral vascular disease ([place])    prior stenting  . Restless legs     Past Surgical History:  Procedure Laterality Date  . CARDIAC CATHETERIZATION  2004   had remote tandem stents in 1997 and 1998 for LAD lesions, RCA stent in 1997 , midprto=ion, and a proximal RCA  SENT IN 1999, all DES; some were IVIS  guide.  [name] CARDIAC CATHETERIZATION  09/07/2012   demonstrated severe 2- vessel disease,patent LAD,patient circ and patent  right stents, but 90% ostial LAD whic was new.  . CAROTID DOPPLER  100/24/11   RGT BULB and PROXIMAL ICA 0-49%; LFT. ICA  0-49 ; RGT SUBCLAVIAN ARTERY < 50% ; LFT SUBCLAVIAN normal patency; RGT  and LFT  VERTEBRAL ARTERIES  . COLONOSCOPY  11/06/2016   Diverticulosis of colon. Internal hemorrhoids.   . CORONARY ANGIOPLASTY    . CORONARY ANGIOPLASTY WITH STENT PLACEMENT  '97, '98, '04, '06, '08, '10   seven cardiac stents  . CORONARY ARTERY BYPASS GRAFT N/A 09/12/2012   Procedure: OFF PUMP CORONARY ARTERY BYPASS GRAFTING (CABG);  Surgeon: Melrose Nakayama, MD;  Location: [place];  Service: Open Heart Surgery;  Laterality: N/A;  Times two using left internal mammary artery and endoscopically harvested right saphenous vein  ;LIMA-LAD and SVG -OM1  . DOPPLER ECHOCARDIOGRAPHY  11/23/2012,09/30/2010    LV EF 50-55%----09/30/2010-LV EF = >55%  . ENDARTERECTOMY FEMORAL Right 12/19/2015   Procedure: ENDARTERECTOMY RIGHT FEMORAL  ARTERY;  Surgeon: Serafina Mitchell, MD;  Location: [place];  Service: Vascular;  Laterality: Right;  . event monitor  11/04/2012-11/17/2012   pt denies this hx on 07/24/2014  . FEMORAL ARTERY STENT Bilateral 7/06  8/08   Lt SFA '06, Rt SFA 8/08  . HERNIA REPAIR  10/2008   "repaired w/gallbladder OR"  . HERNIA REPAIR  07/2016  . INGUINAL HERNIA REPAIR Right 12/28/2016   Procedure: OPEN REPAIR RIGHT GROIN HERNIA WITH MESH;  Surgeon: Fanny Skates, MD;  Location: [place];  Service: General;  Laterality: Right;  . LAPAROSCOPIC CHOLECYSTECTOMY  June 2010  . LEFT HEART CATHETERIZATION WITH CORONARY ANGIOGRAM N/A 09/07/2012   Procedure: LEFT HEART CATHETERIZATION WITH CORONARY ANGIOGRAM;  Surgeon: Troy Sine, MD;  Location: [facility] CATH LAB;  Service: Cardiovascular;  Laterality: N/A;  . LEFT HEART CATHETERIZATION WITH CORONARY/GRAFT ANGIOGRAM N/A 07/24/2014   Procedure: LEFT HEART CATHETERIZATION WITH Beatrix Fetters;  Surgeon: Troy Sine, MD; LAD 70%, 50% ISR, mLAD 99%, CFX 30%, patent stent, RCA patent, LIMA-LAD occluded, SVG-OM 1 patent, EF 50-55%  . LEXISCAN STRESS TEST  09/06/2012  . LOWER ARTERIAL DOPPLER  03/25/2012   FGT CIA 0-49%; RGT CFA AND SFA AT STENT 0-49%; RGT DISTAL SFA = <50%; LFT. EIA  50-69%; LFT. CFA 0-49% ;;LFT. SFA  AT STENT NORMAL PATENCY  . NM MYOVIEW LTD  05/06/2012   EF -STUDY NOT GATED, NORMAL STRESS NUCLEAR ,NORMAL LV FUNCTION  . PATCH ANGIOPLASTY Right 12/19/2015   Procedure: PATCH ANGIOPLASTY RIGHT FEMORAL ARTERY;  Surgeon: Serafina Mitchell, MD;  Location: [place];  Service: Vascular;  Laterality: Right;  . PERCUTANEOUS CORONARY STENT INTERVENTION (PCI-S) Right 07/24/2014   Procedure: PERCUTANEOUS CORONARY STENT INTERVENTION (PCI-S);  Surgeon: Troy Sine, MD; ostial LAD PTCA with angioscope scoring balloon, 3.0 x 34 mm Resolute DES extending from the ostium to the mid LAD, covering all significant stenoses   . PERIPHERAL VASCULAR CATHETERIZATION N/A 09/26/2015    Procedure: Abdominal Aortogram w/Lower Extremity;  Surgeon: Lorretta Harp, MD;  Location: [place] CV LAB;  Service: Cardiovascular;  Laterality: N/A;  . PERIPHERAL VASCULAR CATHETERIZATION Right 02/20/2016   Procedure: Peripheral Vascular Atherectomy;  Surgeon: Lorretta Harp, MD;  Location: [place] CV LAB;  Service: Cardiovascular;  Laterality: Right;  SFA  . PERIPHERAL VASCULAR CATHETERIZATION Right 02/20/2016   Procedure: Peripheral Vascular Balloon Angioplasty;  Surgeon: Lorretta Harp, MD;  Location: [place] CV LAB;  Service: Cardiovascular;  Laterality: Right;  SFA  . TONSILLECTOMY  1950's  .  UPPER GASTROINTESTINAL ENDOSCOPY      Allergies  Allergen Reactions  . Metoprolol Other (See Comments)    Fatigue and malaise  . Tricor [Fenofibrate] Other (See Comments)    Tired/weak  . Fenofibrate Micronized     UNSPECIFIED REACTION   . Levofloxacin     UNSPECIFIED REACTION   . Metaxalone Other (See Comments)    Unknown reaction  . Niaspan [Niacin]     UNSPECIFIED REACTION   . Fish Oil Rash  . Meloxicam Other (See Comments)    Achy, fatigue  . Sulfadiazine Hives and Rash    Current Outpatient Medications  Medication Sig Dispense Refill  . albuterol (PROAIR HFA) 108 (90 Base) MCG/ACT inhaler Inhale 2 puffs every 6 hours as needed 1 Inhaler 12  . aspirin EC 81 MG tablet Take 81 mg by mouth daily.     . Biotin 1000 MCG tablet Take 1,000 mcg by mouth daily.    [name] BYSTOLIC 5 MG tablet TAKE 1/2 TABLET ONCE DAILY 30 tablet 6  . cetirizine (ZYRTEC) 10 MG tablet Take 10 mg by mouth daily.     . cholestyramine (QUESTRAN) 4 G packet Take 1 packet by mouth 2 (two) times daily with a meal. 60 each 11  . clopidogrel (PLAVIX) 75 MG tablet TAKE ONE TABLET BY MOUTH EVERY DAY 90 tablet 3  . cyclobenzaprine (FLEXERIL) 10 MG tablet Take 1 tablet by mouth as directed.    . isosorbide mononitrate (IMDUR) 30 MG 24 hr tablet TAKE 1/2 TABLET BY MOUTH EVERY DAY 45 tablet 2  . levothyroxine  (SYNTHROID, LEVOTHROID) 25 MCG tablet Take 25 mcg by mouth daily before breakfast.     . losartan (COZAAR) 100 MG tablet TAKE ONE TABLET BY MOUTH EVERY DAY (Patient taking differently: TAKE ONE HALF TABLET BY MOUTH EVERY DAY) 30 tablet 11  . pantoprazole (PROTONIX) 40 MG tablet Take 1 tablet (40 mg total) by mouth 2 (two) times daily. 60 tablet 0  . Probiotic Product (PROBIOTIC PO) Take 1 tablet by mouth daily.    . rosuvastatin (CRESTOR) 20 MG tablet TAKE ONE TABLET BY MOUTH EVERY DAY 90 tablet 3  . triamcinolone cream (KENALOG) 0.1 % Apply 1 application topically as directed.    . zolpidem (AMBIEN) 10 MG tablet Take 1 tablet by mouth at bedtime as needed.    . nitroGLYCERIN (NITROSTAT) 0.4 MG SL tablet PLACE 1 TABLET UNDER THE TONGUE, MAY REPEAT EVERY 5 MINUTES MAX 3 TABLETS 25 tablet 11   Current Facility-Administered Medications  Medication Dose Route Frequency Provider Last Rate Last Dose  . 0.9 %  sodium chloride infusion  500 mL Intravenous Continuous Jackquline Denmark, MD        Social History   Socioeconomic History  . Marital status: Married    Spouse name: Not on file  . Number of children: Not on file  . Years of education: Not on file  . Highest education level: Not on file  Occupational History  . Not on file  Social Needs  . Financial resource strain: Not on file  . Food insecurity:    Worry: Not on file    Inability: Not on file  . Transportation needs:    Medical: Not on file    Non-medical: Not on file  Tobacco Use  . Smoking status: Current Every Day Smoker    Packs/day: 0.50    Years: 56.00    Pack years: 28.00    Types: Cigarettes    Last attempt to  quit: 03/25/2016    Years since quitting: 1.8  . Smokeless tobacco: Former Systems developer    Types: Chew  Substance and Sexual Activity  . Alcohol use: Yes    Alcohol/week: 1.0 standard drinks    Types: 1 Standard drinks or equivalent per week    Comment: 09/26/2015 "varies; might have a few drinks q 2 months"  . Drug  use: No  . Sexual activity: Not Currently  Lifestyle  . Physical activity:    Days per week: Not on file    Minutes per session: Not on file  . Stress: Not on file  Relationships  . Social connections:    Talks on phone: Not on file    Gets together: Not on file    Attends religious service: Not on file    Active member of club or organization: Not on file    Attends meetings of clubs or organizations: Not on file    Relationship status: Not on file  . Intimate partner violence:    Fear of current or ex partner: Not on file    Emotionally abused: Not on file    Physically abused: Not on file    Forced sexual activity: Not on file  Other Topics Concern  . Not on file  Social History Narrative  . 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 Heis married , is one child and 4 grandchildren.[name]  He continues to work part time in [employer].  His work is physical and often requires lifting.  He had smoked heavily for over 55 years. He is now smoking 3-cigarettes per day.  He does not drink alcohol or use illicit drugs.  Family History  Problem Relation Age of Onset  . Coronary artery disease Father 73  . Heart attack Father   . Coronary artery disease Brother 19       CABG in 2000  . Colon cancer Neg Hx   . Esophageal cancer Neg Hx   . Rectal cancer Neg Hx   . Stomach cancer Neg Hx     ROS General: Negative; No fevers, chills, or night sweats;  HEENT: Negative; No changes in vision or hearing, sinus congestion, difficulty swallowing Pulmonary: Negative; No cough, wheezing, shortness of breath, hemoptysis Cardiovascular: Negative; No chest pain, presyncope, syncope, palpitations Sinus post 3.recent peripheral vascular procedures as noted above GI: Negative; No nausea, vomiting, diarrhea, or abdominal pain GU: Negative; No dysuria, hematuria, or difficulty voiding Musculoskeletal: Improved range of motion in the left shoulder, but also notes intermittent  twinges of pain; positive for occasional myalgias  Hematologic/Oncology: Negative; no easy bruising, bleeding Endocrine: Negative; no heat/cold intolerance; no diabetes Neuro: Negative; no changes in balance, headaches Skin: Negative; No rashes or skin lesions Psychiatric: Negative; No behavioral problems, depression Sleep: Negative; No snoring, daytime sleepiness, hypersomnolence, bruxism, restless legs, hypnogognic hallucinations, no cataplexy Other comprehensive 14 point system review is negative.  PE BP 128/70   Pulse 72   Ht 5' 9" (1.753 m)   Wt 187 lb 3.2 oz (84.9 kg)   BMI 27.64 kg/m    Repeat blood pressure by me was 120/70  Wt Readings from Last 3 Encounters:  02/02/18 187 lb 3.2 oz (84.9 kg)  12/07/17 189 lb 8 oz (86 kg)  11/04/17 188 lb (85.3 kg)   General: Alert, oriented, no distress.  Skin: normal turgor, no rashes, warm and dry HEENT: Normocephalic, atraumatic. Pupils equal round and reactive to light; sclera anicteric; extraocular  muscles intact;  Nose without nasal septal hypertrophy Mouth/Parynx benign; Mallinpatti scale 3 Neck: No JVD, no carotid bruits; normal carotid upstroke Lungs: clear to ausculatation and percussion; no wheezing or rales Chest wall: without tenderness to palpitation Heart: PMI not displaced, RRR, s1 s2 normal, 1/6 systolic murmur, no diastolic murmur, no rubs, gallops, thrills, or heaves Abdomen: soft, nontender; no hepatosplenomehaly, BS+; abdominal aorta nontender and not dilated by palpation. Back: no CVA tenderness Pulses 2+ Musculoskeletal: full range of motion, normal strength, no joint deformities Extremities: no clubbing cyanosis or edema, Homan's sign negative  Neurologic: grossly nonfocal; Cranial nerves grossly wnl Psychologic: Normal mood and affect    ECG (independently read by me): Sinus bradycardia 58 bpm.  No ectopy.  Normal intervals.  June 2018 ECG (independently read by me): Sinus bradycardia 55 bpm.  No  significant ST-T changes.  PR interval 146 ms, QTc interval 417 ms.  October 2017 ECG (independently read by me): Sinus bradycardia 56 bpm with sinus arrhythmia and an isolated PVC.  QTc interval normal.  ECG (independently read by me): Sinus bradycardia 51 bpm.  Normal intervals.  No significant ST changes.  May 2016 ECG (independently read by me): Sinus bradycardia 53 bpm.  Normal intervals.  No ST changes.  March 2016 Prior ECG (independently read by me): Normal sinus rhythm at 66 beats per minute.  No ectopy.  PR interval 150 ms.  QTc interval 421 ms  January 2015 ECG (independently read by me): Normal sinus rhythm at 67 beats per minute; normal intervals.  LABS:  BMET  BMP Latest Ref Rng & Units 11/03/2017 12/23/2016 10/22/2016  Glucose 70 - 99 mg/dL 87 100(H) 105(H)  BUN 6 - 23 mg/dL 23 23(H) 18  Creatinine 0.40 - 1.50 mg/dL 1.12 0.95 1.11  BUN/Creat Ratio 10 - 24 - - 16  Sodium 135 - 145 mEq/L 139 141 138  Potassium 3.5 - 5.1 mEq/L 4.0 4.2 4.7  Chloride 96 - 112 mEq/L 105 111 100  CO2 19 - 32 mEq/L _0 Calcium 8.4 - 10.5 mg/dL 9.2 8.8(L) 9.8     Hepatic Function Panel    Hepatic Function Latest Ref Rng & Units 12/23/2016 10/22/2016 09/03/2016  Total Protein 6.5 - 8.1 g/dL 6.5 7.3 6.6  Albumin 3.5 - 5.0 g/dL 3.5 4.5 3.9  AST 15 - 41 U/L _1 ALT 17 - 63 U/L 16(L) 21 17  Alk Phosphatase 38 - 126 U/L 80 103 76  Total Bilirubin 0.3 - 1.2 mg/dL 0.6 0.5 0.5    CBC  CBC Latest Ref Rng & Units 12/07/2017 11/03/2017 12/23/2016  WBC 4.0 - 10.5 K/uL 7.1 8.9 11.3(H)  Hemoglobin 13.0 - 17.0 g/dL 13.8 13.8 14.3  Hematocrit 39.0 - 52.0 % 41.4 40.8 42.3  Platelets 150.0 - 400.0 K/uL 226.0 220.0 223     BNP No results found for: PROBNP  Lipid Panel     Component Value Date/Time   CHOL 142 10/22/2016 1025   TRIG 237 (H) 10/22/2016 1025   HDL 37 (L) 10/22/2016 1025   CHOLHDL 3.8 10/22/2016 1025   CHOLHDL 3.0 09/06/2012 0455   VLDL 36 09/06/2012 0455   LDLCALC 58  10/22/2016 1025     RADIOLOGY: No results found.  IMPRESSION:  1. Coronary artery disease involving native coronary artery of native heart without angina pectoris   2. Hyperlipidemia LDL goal <70   3. Essential hypertension   4. Medication management   5. PAD (peripheral artery disease) ([place])  ASSESSMENT AND PLAN: Mr. Antony Haste is a 72 year old white male who has established CAD and PVD.  He had undergone prior stenting to his proximal LAD, circumflex coronary artery as well as right coronary artery and  is status post CABG surgery in April 2014 by Dr. Roxan Hockey.  He developed recurrent symptomatology and catheterization in 2014 revealed an occluded LIMA graft and high-grade 70% ostial LAD stenosis with 95% in-stent restenosis in the previously placed stent.  He underwent successful Angiosculpt scoring balloon insertion of a 3.034 mm Resolute DES stent extending from the ostium to the mid LAD with excellent result.  He is been without anginal symptomatology since.  He is status post 3 PVD interventions and surgery in 2017.  His claudication symptoms have improved.  His last nuclear perfusion study was reviewed with him again today and showed fairly normal perfusion with an ejection fraction of 58% without ST segment changes during stress and with improvement from his prior 2016 study with resolution of previous anterior and apical defect.  Since I saw him, he tells me his blood pressure had decreased in June and July and as a result he reduced his losartan to 50 mg.  His blood pressure today remains stable on losartan 50 mg, Bystolic 2.5 mg, and isosorbide.  He is not having anginal symptoms.  He continues to be on dual antiplatelet therapy.  He continues to be on rosuvastatin 20 mg with target LDL less than 70.  He has not had repeat laboratory since June 2018 at which time his LDL was 58.  I will recheck a complete set of fasting laboratory.  Adjustments to his medical regimen will be  made if necessary.  I will see him in 1 year for reevaluation or sooner if problems arise.  Time spent: 25 minutes  Troy Sine, MD, [facility]  02/04/2018 11:20 PM

## 2018-02-02 NOTE — Patient Instructions (Signed)
Medication Instructions:  Your physician recommends that you continue on your current medications as directed. Please refer to the Current Medication list given to you today.  Labwork: Please return for FASTING labs (CMET, CBC, Lipid, TSH)  Our in office lab hours are Monday-Friday 8:00-4:00, closed for lunch 12:45-1:45 pm.  No appointment needed.  Follow-Up: Your physician wants you to follow-up in: 1 year with Dr. Claiborne Billings.  You will receive a reminder letter in the mail two months in advance. If you don't receive a letter, please call our office to schedule the follow-up appointment.   Any Other Special Instructions Will Be Listed Below (If Applicable).     If you need a refill on your cardiac medications before your next appointment, please call your pharmacy.

## 2018-02-03 ENCOUNTER — Other Ambulatory Visit: Payer: Self-pay | Admitting: Cardiovascular Disease

## 2018-02-04 ENCOUNTER — Encounter: Payer: Self-pay | Admitting: Cardiovascular Disease

## 2018-02-08 LAB — COMPREHENSIVE METABOLIC PANEL
ALT: 16 IU/L (ref 0–44)
AST: 14 IU/L (ref 0–40)
Albumin/Globulin Ratio: 1.9 (ref 1.2–2.2)
Albumin: 3.9 g/dL (ref 3.5–4.8)
Alkaline Phosphatase: 88 IU/L (ref 39–117)
BUN/Creatinine Ratio: 17 (ref 10–24)
BUN: 18 mg/dL (ref 8–27)
Bilirubin Total: 0.4 mg/dL (ref 0.0–1.2)
CO2: 25 mmol/L (ref 20–29)
Calcium: 8.9 mg/dL (ref 8.6–10.2)
Chloride: 103 mmol/L (ref 96–106)
Creatinine, Ser: 1.09 mg/dL (ref 0.76–1.27)
GFR calc Af Amer: 78 mL/min/{1.73_m2} (ref >59)
GFR calc non Af Amer: 67 mL/min/{1.73_m2} (ref >59)
Globulin, Total: 2.1 g/dL (ref 1.5–4.5)
Glucose: 77 mg/dL (ref 65–99)
Potassium: 4.6 mmol/L (ref 3.5–5.2)
Sodium: 142 mmol/L (ref 134–144)
Total Protein: 6 g/dL (ref 6.0–8.5)

## 2018-02-08 LAB — LIPID PANEL
Chol/HDL Ratio: 2.9 ratio (ref 0.0–5.0)
Cholesterol, Total: 141 mg/dL (ref 100–199)
HDL: 48 mg/dL (ref >39)
LDL Calculated: 63 mg/dL (ref 0–99)
Triglycerides: 148 mg/dL (ref 0–149)
VLDL Cholesterol Cal: 30 mg/dL (ref 5–40)

## 2018-02-08 LAB — CBC
Hematocrit: 44.4 % (ref 37.5–51.0)
Hemoglobin: 15.2 g/dL (ref 13.0–17.7)
MCH: 29.6 pg (ref 26.6–33.0)
MCHC: 34.2 g/dL (ref 31.5–35.7)
MCV: 87 fL (ref 79–97)
Platelets: 224 10*3/uL (ref 150–450)
RBC: 5.13 x10E6/uL (ref 4.14–5.80)
RDW: 13.2 % (ref 12.3–15.4)
WBC: 10.2 10*3/uL (ref 3.4–10.8)

## 2018-02-08 LAB — TSH: TSH: 2.41 u[IU]/mL (ref 0.450–4.500)

## 2018-05-18 DIAGNOSIS — I2699 Other pulmonary embolism without acute cor pulmonale: Secondary | ICD-10-CM

## 2018-05-18 HISTORY — DX: Other pulmonary embolism without acute cor pulmonale: I26.99

## 2018-05-23 ENCOUNTER — Telehealth: Payer: Self-pay | Admitting: Cardiovascular Disease

## 2018-05-23 NOTE — Telephone Encounter (Signed)
Pt c/o swelling: STAT is pt has developed SOB within 24 hours  1) How much weight have you gained and in what time span? none  2) If swelling, where is the swelling located? Leg, feet and ankle  3) Are you currently taking a fluid pill? No  4) Are you currently SOB? Some off and on, not at the present- also some chest discomfort off and on not at present  5) Do you have a log of your daily weights (if so, list)? no  6) Have you gained 3 pounds in a day or 5 pounds in a week?  no  7) Have you traveled recently?no- made pt appt for tomorrow with Lurena Joiner- please call to evaluate .

## 2018-05-23 NOTE — Telephone Encounter (Signed)
Patient reports leg, feet, ankle swelling in right leg since Friday. He went to urgent care Saturday and they couldn't find any issues. He has stents in that leg & groin. He reports calf pain. He reports the leg pain comes and goes - is not continuous. He does report occasional SOB but has had congestion and he states he can walk his dog without shortness of breath.  He has not really had much chest pain - occasionally, but not recently, and when he does have it, it is central & up to right side of chest - he has taken NTG a few times in the past 4-5 months.   Offered PA appointment for today at 930 but he will keep his Jan 7 @ 11am appt with Lurena Joiner PA as he has seen him before.  Message routed to MD/PA as Juluis Rainier

## 2018-05-24 ENCOUNTER — Other Ambulatory Visit: Payer: Self-pay | Admitting: Cardiology

## 2018-05-24 ENCOUNTER — Observation Stay (HOSPITAL_COMMUNITY)
Admission: EM | Admit: 2018-05-24 | Discharge: 2018-05-25 | Disposition: A | Discharge status: Home / Self Care | Payer: Medicare Other | Attending: Internal Medicine | Admitting: Internal Medicine

## 2018-05-24 ENCOUNTER — Emergency Department (HOSPITAL_COMMUNITY): Payer: Medicare Other

## 2018-05-24 ENCOUNTER — Ambulatory Visit (HOSPITAL_BASED_OUTPATIENT_CLINIC_OR_DEPARTMENT_OTHER)
Admission: RE | Admit: 2018-05-24 | Discharge: 2018-05-24 | Disposition: A | Discharge status: Home / Self Care | Payer: Medicare Other | Source: Ambulatory Visit | Attending: Cardiovascular Disease | Admitting: Cardiovascular Disease

## 2018-05-24 ENCOUNTER — Encounter: Payer: Self-pay | Admitting: Cardiology

## 2018-05-24 ENCOUNTER — Other Ambulatory Visit: Payer: Self-pay

## 2018-05-24 ENCOUNTER — Encounter (HOSPITAL_COMMUNITY): Payer: Self-pay | Admitting: Emergency Medicine

## 2018-05-24 ENCOUNTER — Other Ambulatory Visit (HOSPITAL_COMMUNITY): Payer: Self-pay

## 2018-05-24 ENCOUNTER — Encounter (HOSPITAL_COMMUNITY): Payer: Self-pay

## 2018-05-24 ENCOUNTER — Ambulatory Visit (INDEPENDENT_AMBULATORY_CARE_PROVIDER_SITE_OTHER): Payer: Medicare Other | Admitting: Cardiology

## 2018-05-24 VITALS — BP 116/60 | HR 60 | Ht 69.0 in | Wt 191.6 lb

## 2018-05-24 DIAGNOSIS — K589 Irritable bowel syndrome without diarrhea: Secondary | ICD-10-CM | POA: Insufficient documentation

## 2018-05-24 DIAGNOSIS — I82411 Acute embolism and thrombosis of right femoral vein: Secondary | ICD-10-CM | POA: Insufficient documentation

## 2018-05-24 DIAGNOSIS — Z79890 Hormone replacement therapy (postmenopausal): Secondary | ICD-10-CM | POA: Insufficient documentation

## 2018-05-24 DIAGNOSIS — F17210 Nicotine dependence, cigarettes, uncomplicated: Secondary | ICD-10-CM | POA: Insufficient documentation

## 2018-05-24 DIAGNOSIS — I824Y9 Acute embolism and thrombosis of unspecified deep veins of unspecified proximal lower extremity: Secondary | ICD-10-CM

## 2018-05-24 DIAGNOSIS — R001 Bradycardia, unspecified: Secondary | ICD-10-CM | POA: Diagnosis present

## 2018-05-24 DIAGNOSIS — Z7952 Long term (current) use of systemic steroids: Secondary | ICD-10-CM | POA: Insufficient documentation

## 2018-05-24 DIAGNOSIS — Z955 Presence of coronary angioplasty implant and graft: Secondary | ICD-10-CM | POA: Diagnosis not present

## 2018-05-24 DIAGNOSIS — Z7982 Long term (current) use of aspirin: Secondary | ICD-10-CM | POA: Insufficient documentation

## 2018-05-24 DIAGNOSIS — L03039 Cellulitis of unspecified toe: Secondary | ICD-10-CM | POA: Diagnosis not present

## 2018-05-24 DIAGNOSIS — Z951 Presence of aortocoronary bypass graft: Secondary | ICD-10-CM | POA: Diagnosis not present

## 2018-05-24 DIAGNOSIS — I739 Peripheral vascular disease, unspecified: Secondary | ICD-10-CM

## 2018-05-24 DIAGNOSIS — I2699 Other pulmonary embolism without acute cor pulmonale: Principal | ICD-10-CM | POA: Diagnosis present

## 2018-05-24 DIAGNOSIS — J449 Chronic obstructive pulmonary disease, unspecified: Secondary | ICD-10-CM | POA: Insufficient documentation

## 2018-05-24 DIAGNOSIS — I700 Atherosclerosis of aorta: Secondary | ICD-10-CM | POA: Insufficient documentation

## 2018-05-24 DIAGNOSIS — Z888 Allergy status to other drugs, medicaments and biological substances status: Secondary | ICD-10-CM | POA: Insufficient documentation

## 2018-05-24 DIAGNOSIS — Z882 Allergy status to sulfonamides status: Secondary | ICD-10-CM | POA: Insufficient documentation

## 2018-05-24 DIAGNOSIS — I252 Old myocardial infarction: Secondary | ICD-10-CM | POA: Diagnosis not present

## 2018-05-24 DIAGNOSIS — Z7902 Long term (current) use of antithrombotics/antiplatelets: Secondary | ICD-10-CM | POA: Diagnosis not present

## 2018-05-24 DIAGNOSIS — I2510 Atherosclerotic heart disease of native coronary artery without angina pectoris: Secondary | ICD-10-CM

## 2018-05-24 DIAGNOSIS — R600 Localized edema: Secondary | ICD-10-CM | POA: Insufficient documentation

## 2018-05-24 DIAGNOSIS — E785 Hyperlipidemia, unspecified: Secondary | ICD-10-CM | POA: Diagnosis not present

## 2018-05-24 DIAGNOSIS — Z8249 Family history of ischemic heart disease and other diseases of the circulatory system: Secondary | ICD-10-CM | POA: Insufficient documentation

## 2018-05-24 DIAGNOSIS — R609 Edema, unspecified: Secondary | ICD-10-CM

## 2018-05-24 DIAGNOSIS — E039 Hypothyroidism, unspecified: Secondary | ICD-10-CM | POA: Diagnosis not present

## 2018-05-24 DIAGNOSIS — I10 Essential (primary) hypertension: Secondary | ICD-10-CM

## 2018-05-24 DIAGNOSIS — L089 Local infection of the skin and subcutaneous tissue, unspecified: Secondary | ICD-10-CM | POA: Insufficient documentation

## 2018-05-24 DIAGNOSIS — I480 Paroxysmal atrial fibrillation: Secondary | ICD-10-CM | POA: Diagnosis not present

## 2018-05-24 DIAGNOSIS — I493 Ventricular premature depolarization: Secondary | ICD-10-CM | POA: Diagnosis not present

## 2018-05-24 DIAGNOSIS — Z79899 Other long term (current) drug therapy: Secondary | ICD-10-CM | POA: Insufficient documentation

## 2018-05-24 DIAGNOSIS — I119 Hypertensive heart disease without heart failure: Secondary | ICD-10-CM | POA: Diagnosis not present

## 2018-05-24 DIAGNOSIS — Z881 Allergy status to other antibiotic agents status: Secondary | ICD-10-CM | POA: Diagnosis not present

## 2018-05-24 DIAGNOSIS — B9689 Other specified bacterial agents as the cause of diseases classified elsewhere: Secondary | ICD-10-CM | POA: Diagnosis not present

## 2018-05-24 DIAGNOSIS — K219 Gastro-esophageal reflux disease without esophagitis: Secondary | ICD-10-CM | POA: Diagnosis not present

## 2018-05-24 DIAGNOSIS — I82409 Acute embolism and thrombosis of unspecified deep veins of unspecified lower extremity: Secondary | ICD-10-CM | POA: Diagnosis present

## 2018-05-24 DIAGNOSIS — Z720 Tobacco use: Secondary | ICD-10-CM

## 2018-05-24 HISTORY — DX: Other pulmonary embolism without acute cor pulmonale: I26.99

## 2018-05-24 LAB — CBC WITH DIFFERENTIAL/PLATELET
Abs Immature Granulocytes: 0.22 10*3/uL — ABNORMAL HIGH (ref 0.00–0.07)
Basophils Absolute: 0 10*3/uL (ref 0.0–0.1)
Basophils Relative: 0 %
Eosinophils Absolute: 0 10*3/uL (ref 0.0–0.5)
Eosinophils Relative: 0 %
HCT: 45.5 % (ref 39.0–52.0)
Hemoglobin: 14.6 g/dL (ref 13.0–17.0)
Immature Granulocytes: 1 %
Lymphocytes Relative: 12 %
Lymphs Abs: 2.1 10*3/uL (ref 0.7–4.0)
MCH: 29 pg (ref 26.0–34.0)
MCHC: 32.1 g/dL (ref 30.0–36.0)
MCV: 90.5 fL (ref 80.0–100.0)
Monocytes Absolute: 1 10*3/uL (ref 0.1–1.0)
Monocytes Relative: 6 %
Neutro Abs: 14.2 10*3/uL — ABNORMAL HIGH (ref 1.7–7.7)
Neutrophils Relative %: 81 %
Platelets: 226 10*3/uL (ref 150–400)
RBC: 5.03 MIL/uL (ref 4.22–5.81)
RDW: 14.6 % (ref 11.5–15.5)
WBC: 17.6 10*3/uL — ABNORMAL HIGH (ref 4.0–10.5)
nRBC: 0 % (ref 0.0–0.2)

## 2018-05-24 LAB — BASIC METABOLIC PANEL
Anion gap: 12 (ref 5–15)
BUN: 21 mg/dL (ref 8–23)
CO2: 22 mmol/L (ref 22–32)
Calcium: 9.2 mg/dL (ref 8.9–10.3)
Chloride: 105 mmol/L (ref 98–111)
Creatinine, Ser: 1.19 mg/dL (ref 0.61–1.24)
GFR calc Af Amer: >60 mL/min (ref >60)
GFR calc non Af Amer: >60 mL/min (ref >60)
Glucose, Bld: 116 mg/dL — ABNORMAL HIGH (ref 70–99)
Potassium: 3.5 mmol/L (ref 3.5–5.1)
Sodium: 139 mmol/L (ref 135–145)

## 2018-05-24 MED ORDER — ALBUTEROL SULFATE (2.5 MG/3ML) 0.083% IN NEBU: 2.5000 mg | INHALATION_SOLUTION | Freq: Four times a day (QID) | RESPIRATORY_TRACT | Status: DC | PRN

## 2018-05-24 MED ORDER — LORATADINE 10 MG PO TABS
10.0000 mg | ORAL_TABLET | Freq: Every day | ORAL | Status: DC
  Administered 2018-05-25: 10 mg via ORAL
  Filled 2018-05-24: qty 1

## 2018-05-24 MED ORDER — SACCHAROMYCES BOULARDII 250 MG PO CAPS
250.0000 mg | ORAL_CAPSULE | Freq: Every day | ORAL | Status: DC
  Administered 2018-05-25: 250 mg via ORAL
  Filled 2018-05-24: qty 1

## 2018-05-24 MED ORDER — IOPAMIDOL (ISOVUE-370) INJECTION 76%
INTRAVENOUS | Status: AC
  Administered 2018-05-24: 100 mL
  Filled 2018-05-24: qty 100

## 2018-05-24 MED ORDER — NITROGLYCERIN 0.4 MG SL SUBL: 0.4000 mg | SUBLINGUAL_TABLET | SUBLINGUAL | Status: DC | PRN

## 2018-05-24 MED ORDER — ISOSORBIDE MONONITRATE ER 30 MG PO TB24
15.0000 mg | ORAL_TABLET | Freq: Every day | ORAL | Status: DC
  Administered 2018-05-25: 15 mg via ORAL
  Filled 2018-05-24: qty 1

## 2018-05-24 MED ORDER — LEVOTHYROXINE SODIUM 25 MCG PO TABS
25.0000 ug | ORAL_TABLET | Freq: Every day | ORAL | Status: DC
  Administered 2018-05-25: 25 ug via ORAL
  Filled 2018-05-24 (×2): qty 1

## 2018-05-24 MED ORDER — ROSUVASTATIN CALCIUM 20 MG PO TABS
20.0000 mg | ORAL_TABLET | Freq: Every day | ORAL | Status: DC
  Administered 2018-05-25: 20 mg via ORAL
  Filled 2018-05-24: qty 1

## 2018-05-24 MED ORDER — CLOPIDOGREL BISULFATE 75 MG PO TABS: 75.0000 mg | ORAL_TABLET | Freq: Every day | ORAL | Status: DC

## 2018-05-24 MED ORDER — HEPARIN (PORCINE) 25000 UT/250ML-% IV SOLN
1250.0000 [IU]/h | INTRAVENOUS | Status: DC
  Administered 2018-05-24: 1400 [IU]/h via INTRAVENOUS
  Filled 2018-05-24: qty 250

## 2018-05-24 MED ORDER — ZOLPIDEM TARTRATE 5 MG PO TABS: 5.0000 mg | ORAL_TABLET | Freq: Every day | ORAL | Status: DC

## 2018-05-24 MED ORDER — PANTOPRAZOLE SODIUM 40 MG PO TBEC
40.0000 mg | DELAYED_RELEASE_TABLET | Freq: Every day | ORAL | Status: DC
  Administered 2018-05-24: 40 mg via ORAL
  Filled 2018-05-24: qty 1

## 2018-05-24 MED ORDER — ACETAMINOPHEN 325 MG PO TABS: 650.0000 mg | ORAL_TABLET | Freq: Four times a day (QID) | ORAL | Status: DC | PRN

## 2018-05-24 MED ORDER — PREDNISONE 20 MG PO TABS
20.0000 mg | ORAL_TABLET | Freq: Two times a day (BID) | ORAL | Status: DC
  Administered 2018-05-25: 20 mg via ORAL
  Filled 2018-05-24: qty 1

## 2018-05-24 MED ORDER — CHOLESTYRAMINE 4 G PO PACK
2.0000 g | PACK | Freq: Two times a day (BID) | ORAL | Status: DC
  Administered 2018-05-25: 2 g via ORAL
  Filled 2018-05-24 (×2): qty 1

## 2018-05-24 MED ORDER — ASPIRIN EC 81 MG PO TBEC
81.0000 mg | DELAYED_RELEASE_TABLET | Freq: Every day | ORAL | Status: DC
  Administered 2018-05-25: 81 mg via ORAL
  Filled 2018-05-24: qty 1

## 2018-05-24 MED ORDER — NEBIVOLOL HCL 2.5 MG PO TABS
2.5000 mg | ORAL_TABLET | Freq: Every day | ORAL | Status: DC
  Administered 2018-05-25: 2.5 mg via ORAL
  Filled 2018-05-24: qty 1

## 2018-05-24 MED ORDER — ONDANSETRON HCL 4 MG PO TABS: 4.0000 mg | ORAL_TABLET | Freq: Four times a day (QID) | ORAL | Status: DC | PRN

## 2018-05-24 MED ORDER — CYCLOBENZAPRINE HCL 10 MG PO TABS: 10.0000 mg | ORAL_TABLET | Freq: Three times a day (TID) | ORAL | Status: DC | PRN

## 2018-05-24 MED ORDER — HEPARIN BOLUS VIA INFUSION
4400.0000 [IU] | Freq: Once | INTRAVENOUS | Status: AC
  Administered 2018-05-24: 4400 [IU] via INTRAVENOUS
  Filled 2018-05-24: qty 4400

## 2018-05-24 MED ORDER — ACETAMINOPHEN 650 MG RE SUPP: 650.0000 mg | Freq: Four times a day (QID) | RECTAL | Status: DC | PRN

## 2018-05-24 MED ORDER — ONDANSETRON HCL 4 MG/2ML IJ SOLN: 4.0000 mg | Freq: Four times a day (QID) | INTRAMUSCULAR | Status: DC | PRN

## 2018-05-24 MED ORDER — LOSARTAN POTASSIUM 50 MG PO TABS
50.0000 mg | ORAL_TABLET | Freq: Every day | ORAL | Status: DC
  Administered 2018-05-25: 50 mg via ORAL
  Filled 2018-05-24: qty 1

## 2018-05-24 NOTE — Addendum Note (Signed)
Addended by: Cristopher Estimable on: 05/24/2018 03:21 PM   Modules accepted: Orders

## 2018-05-24 NOTE — Assessment & Plan Note (Signed)
LDL 63 Sept 2019

## 2018-05-24 NOTE — Assessment & Plan Note (Signed)
Smoker- followed by Dr Annamaria Boots

## 2018-05-24 NOTE — ED Triage Notes (Signed)
Pt reports occasional episodes of shortness of breath, denies CP or SOB at this time.

## 2018-05-24 NOTE — Assessment & Plan Note (Signed)
Off pump LIMA-LAD and SVG-OM LAD PCI with DES March 2016 Myoview low risk April 2014

## 2018-05-24 NOTE — Assessment & Plan Note (Signed)
Last PTA was to Rt SFA sept 2017 Dopplers April 2019-bilateral ABI within normal range

## 2018-05-24 NOTE — Assessment & Plan Note (Signed)
R/O DVT

## 2018-05-24 NOTE — Assessment & Plan Note (Signed)
Chronic. 

## 2018-05-24 NOTE — Progress Notes (Signed)
Venous duplex of the bilateral lower extremities completed. There is evidence of acute deep vein thrombosis in the right lower extremity. See results under chart review- CV proc. Spoke directly with Kerin Ransom, patient taken to exam room and given further instructions by [facility] and Dr. Oval Linsey.

## 2018-05-24 NOTE — ED Triage Notes (Signed)
Pt was sent by PCP for P.E. work up after positive DVT study.

## 2018-05-24 NOTE — Patient Instructions (Signed)
Medication Instructions:  NONE  If you need a refill on your cardiac medications before your next appointment, please call your pharmacy.   Lab work: NONE  If you have labs (blood work) drawn today and your tests are completely normal, you will receive your results only by: [name] MyChart Message (if you have MyChart) OR . A paper copy in the mail If you have any lab test that is abnormal or we need to change your treatment, we will call you to review the results.  Testing/Procedures: Venous Doppler- Right leg  Follow-Up: At [facility], you and your health needs are our priority.  As part of our continuing mission to provide you with exceptional heart care, we have created designated Provider Care Teams.  These Care Teams include your primary Cardiologist (physician) and Advanced Practice Providers (APPs -  Physician Assistants and Nurse Practitioners) who all work together to provide you with the care you need, when you need it. You will need a follow up appointment in 3 months with Shelva Majestic, MD

## 2018-05-24 NOTE — H&P (Signed)
History and Physical    Brian Brock:[id number] DOB: 14-Sep-1945 DOA: 05/24/2018  PCP: [name], MD  Patient coming from: Home.  Chief Complaint: Right leg swelling and shortness of breath.  HPI: Brian Brock is a 73 y.o. male with history of CAD status post CABG and stenting, hypertension, COPD, ongoing tobacco abuse, hypothyroidism had followed up with his cardiology office and at that time a complaint of right lower extremity swelling yesterday and Dopplers done showed extensive DVT involving the right lower extremity from femoral vein downwards.  Since patient also had some shortness of breath which has been ongoing for last 1 month patient was referred to the ER.  Patient also recently was treated for bronchitis with antibiotics and prednisone.  Patient is also on fluconazole for toenail infection.  Denies any chest pain.  Shortness of breath is present at rest and sometimes increased on exertion.  ED Course: In the ER CT angiogram of the chest shows bilateral pulmonary embolism nonocclusive small in size per the report.  EKG shows sinus rhythm with PVCs.  Given the symptoms and extensive DVT of the right lower extremity patient was started on heparin and admitted for observation.  Review of Systems: As per HPI, rest all negative.   Past Medical History:  Diagnosis Date  . AF (atrial fibrillation) ([place])    happened after CABG, has not had it since  . Allergic rhinitis   . Allergy   . Anginal pain ([place]) 1997  . Arthritis    "lower back" (09/26/2015)  . Childhood asthma   . Chronic bronchitis ([place])    Yearly  . COPD (chronic obstructive pulmonary disease) ([place])   . Coronary artery disease    Multiple stents  . Dyslipidemia   . Dysrhythmia   . Femoral hernia of right side 12/28/2016  . GERD (gastroesophageal reflux disease)   . History of hiatal hernia   . Hyperlipemia   . Hypertension   . Hypothyroidism   . Irritable bowel syndrome   . Myocardial  infarct ([place]) 1997   "mild"  . Peripheral vascular disease ([place])    prior stenting  . Restless legs     Past Surgical History:  Procedure Laterality Date  . CARDIAC CATHETERIZATION  2004   had remote tandem stents in 1997 and 1998 for LAD lesions, RCA stent in 1997 , midprto=ion, and a proximal RCA  SENT IN 1999, all DES; some were IVIS  guide.  [name] CARDIAC CATHETERIZATION  09/07/2012   demonstrated severe 2- vessel disease,patent LAD,patient circ and patent right stents, but 90% ostial LAD whic was new.  . CAROTID DOPPLER  100/24/11   RGT BULB and PROXIMAL ICA 0-49%; LFT. ICA  0-49 ; RGT SUBCLAVIAN ARTERY < 50% ; LFT SUBCLAVIAN normal patency; RGT  and LFT  VERTEBRAL ARTERIES  . COLONOSCOPY  11/06/2016   Diverticulosis of colon. Internal hemorrhoids.   . CORONARY ANGIOPLASTY    . CORONARY ANGIOPLASTY WITH STENT PLACEMENT  '97, '98, '04, '06, '08, '10   seven cardiac stents  . CORONARY ARTERY BYPASS GRAFT N/A 09/12/2012   Procedure: OFF PUMP CORONARY ARTERY BYPASS GRAFTING (CABG);  Surgeon: Melrose Nakayama, MD;  Location: [place];  Service: Open Heart Surgery;  Laterality: N/A;  Times two using left internal mammary artery and endoscopically harvested right saphenous vein  ;LIMA-LAD and SVG -OM1  . DOPPLER ECHOCARDIOGRAPHY  11/23/2012,09/30/2010    LV EF 50-55%----09/30/2010-LV EF = >55%  . ENDARTERECTOMY FEMORAL Right 12/19/2015  Procedure: ENDARTERECTOMY RIGHT FEMORAL ARTERY;  Surgeon: Serafina Mitchell, MD;  Location: [place];  Service: Vascular;  Laterality: Right;  . event monitor  11/04/2012-11/17/2012   pt denies this hx on 07/24/2014  . FEMORAL ARTERY STENT Bilateral 7/06  8/08   Lt SFA '06, Rt SFA 8/08  . HERNIA REPAIR  10/2008   "repaired w/gallbladder OR"  . HERNIA REPAIR  07/2016  . INGUINAL HERNIA REPAIR Right 12/28/2016   Procedure: OPEN REPAIR RIGHT GROIN HERNIA WITH MESH;  Surgeon: Fanny Skates, MD;  Location: [place];  Service: General;  Laterality: Right;  . LAPAROSCOPIC  CHOLECYSTECTOMY  June 2010  . LEFT HEART CATHETERIZATION WITH CORONARY ANGIOGRAM N/A 09/07/2012   Procedure: LEFT HEART CATHETERIZATION WITH CORONARY ANGIOGRAM;  Surgeon: Troy Sine, MD;  Location[facility] CATH LAB;  Service: Cardiovascular;  Laterality: N/A;  . LEFT HEART CATHETERIZATION WITH CORONARY/GRAFT ANGIOGRAM N/A 07/24/2014   Procedure: LEFT HEART CATHETERIZATION WITH Beatrix Fetters;  Surgeon: Troy Sine, MD; LAD 70%, 50% ISR, mLAD 99%, CFX 30%, patent stent, RCA patent, LIMA-LAD occluded, SVG-OM 1 patent, EF 50-55%  . LEXISCAN STRESS TEST  09/06/2012  . LOWER ARTERIAL DOPPLER  03/25/2012   FGT CIA 0-49%; RGT CFA AND SFA AT STENT 0-49%; RGT DISTAL SFA = <50%; LFT. EIA  50-69%; LFT. CFA 0-49% ;;LFT. SFA  AT STENT NORMAL PATENCY  . NM MYOVIEW LTD  05/06/2012   EF -STUDY NOT GATED, NORMAL STRESS NUCLEAR ,NORMAL LV FUNCTION  . PATCH ANGIOPLASTY Right 12/19/2015   Procedure: PATCH ANGIOPLASTY RIGHT FEMORAL ARTERY;  Surgeon: Serafina Mitchell, MD;  Location: [place];  Service: Vascular;  Laterality: Right;  . PERCUTANEOUS CORONARY STENT INTERVENTION (PCI-S) Right 07/24/2014   Procedure: PERCUTANEOUS CORONARY STENT INTERVENTION (PCI-S);  Surgeon: Troy Sine, MD; ostial LAD PTCA with angioscope scoring balloon, 3.0 x 34 mm Resolute DES extending from the ostium to the mid LAD, covering all significant stenoses   . PERIPHERAL VASCULAR CATHETERIZATION N/A 09/26/2015   Procedure: Abdominal Aortogram w/Lower Extremity;  Surgeon: Lorretta Harp, MD;  Location: [place] CV LAB;  Service: Cardiovascular;  Laterality: N/A;  . PERIPHERAL VASCULAR CATHETERIZATION Right 02/20/2016   Procedure: Peripheral Vascular Atherectomy;  Surgeon: Lorretta Harp, MD;  Location: [place] CV LAB;  Service: Cardiovascular;  Laterality: Right;  SFA  . PERIPHERAL VASCULAR CATHETERIZATION Right 02/20/2016   Procedure: Peripheral Vascular Balloon Angioplasty;  Surgeon: Lorretta Harp, MD;  Location: [place]  CV LAB;  Service: Cardiovascular;  Laterality: Right;  SFA  . TONSILLECTOMY  1950's  . UPPER GASTROINTESTINAL ENDOSCOPY       reports that he has been smoking cigarettes. He has a 28.00 pack-year smoking history. He has quit using smokeless tobacco.  His smokeless tobacco use included chew. He reports current alcohol use of about 1.0 standard drinks of alcohol per week. He reports that he does not use drugs.  Allergies  Allergen Reactions  . Other Anaphylaxis, Shortness Of Breath and Swelling    [place] nuts- "will kill me"  . Metoprolol Other (See Comments)    Fatigue and malaise  . Tricor [Fenofibrate] Other (See Comments)    Tired/weak  . Clarithromycin Nausea Only    GI upset  . Fenofibrate Micronized Other (See Comments)    Caused weakness/lethargy  . Levofloxacin Other (See Comments)    UNSPECIFIED REACTION ??  Janace Hoard Other (See Comments)    Unknown reaction ??  . Niaspan [Niacin]     UNSPECIFIED REACTION   .  Fish Oil Rash  . Meloxicam Other (See Comments)    Achy, fatigue  . Sulfadiazine Hives and Rash    Family History  Problem Relation Age of Onset  . Coronary artery disease Father 56  . Heart attack Father   . Coronary artery disease Brother 49       CABG in 2000  . Colon cancer Neg Hx   . Esophageal cancer Neg Hx   . Rectal cancer Neg Hx   . Stomach cancer Neg Hx     Prior to Admission medications   Medication Sig Start Date End Date Taking? Authorizing Provider  albuterol (PROAIR HFA) 108 (90 Base) MCG/ACT inhaler Inhale 2 puffs every 6 hours as needed Patient taking differently: Inhale 2 puffs into the lungs every 6 (six) hours as needed for wheezing or shortness of breath.  09/10/17  Yes Young, [name] D, MD  aspirin EC 81 MG tablet Take 81 mg by mouth daily.    Yes [provider]  azithromycin (ZITHROMAX) 250 MG tablet Take 250-500 mg by mouth See admin instructions. Take 500 mg by mouth on day one, then 250 mg once a day on days 2-5   Yes  [provider]  Biotin 1000 MCG tablet Take 1,000 mcg by mouth daily.   Yes [provider]  BYSTOLIC 5 MG tablet TAKE 1/2 TABLET ONCE DAILY Patient taking differently: Take 2.5 mg by mouth daily.  11/04/16  Yes Troy Sine, MD  cetirizine (ZYRTEC) 10 MG tablet Take 10 mg by mouth daily.    Yes [provider]  cholestyramine Lucrezia Starch) 4 G packet Take 1 packet by mouth 2 (two) times daily with a meal. Patient taking differently: Take 2 g by mouth 2 (two) times daily with a meal.  10/20/12  Yes Terance Ice, MD  clopidogrel (PLAVIX) 75 MG tablet Take 75 mg by mouth daily.   Yes [provider]  cyclobenzaprine (FLEXERIL) 10 MG tablet Take 10 mg by mouth 3 (three) times daily as needed for muscle spasms.  02/16/18  Yes [provider]  fluconazole (DIFLUCAN) 150 MG tablet Take 150 mg by mouth 2 (two) times a week. Wednesday and Saturday   Yes [provider]  fluticasone (FLONASE) 50 MCG/ACT nasal spray Place 2 sprays into both nostrils daily as needed for allergies or rhinitis.  04/29/18  Yes [provider]  ipratropium (ATROVENT) 0.03 % nasal spray Place 1-2 sprays into both nostrils 2 (two) times daily as needed for rhinitis.    Yes [provider]  isosorbide mononitrate (IMDUR) 30 MG 24 hr tablet TAKE 1/2 TABLET BY MOUTH EVERY DAY Patient taking differently: Take 15 mg by mouth daily.  03/17/17  Yes Lorretta Harp, MD  levothyroxine (SYNTHROID, LEVOTHROID) 25 MCG tablet Take 25 mcg by mouth daily before breakfast.  07/31/14  Yes [provider]  loperamide (IMODIUM A-D) 2 MG tablet Take 2 mg by mouth 4 (four) times daily as needed for diarrhea or loose stools.   Yes [provider]  losartan (COZAAR) 100 MG tablet TAKE ONE TABLET BY MOUTH EVERY DAY Patient taking differently: Take 50 mg by mouth daily.  04/07/16  Yes Troy Sine, MD  nitroGLYCERIN (NITROSTAT) 0.4 MG SL tablet PLACE 1 TABLET  UNDER THE TONGUE, MAY REPEAT EVERY 5 MINUTES MAX 3 TABLETS Patient taking differently: Place 0.4 mg under the tongue every 5 (five) minutes as needed for chest pain.  02/03/18  Yes Troy Sine, MD  pantoprazole (PROTONIX) 40 MG tablet Take 1 tablet (40 mg total) by mouth 2 (two) times daily. Patient taking differently: Take 40 mg by mouth at bedtime.  11/03/17  Yes Esterwood, Amy S, PA-C  predniSONE (DELTASONE) 20 MG tablet Take 20 mg by mouth 2 (two) times daily with a meal.    Yes [provider]  Probiotic Product (PROBIOTIC PO) Take 1 capsule by mouth daily.    Yes [provider]  rosuvastatin (CRESTOR) 20 MG tablet TAKE ONE TABLET BY MOUTH EVERY DAY Patient taking differently: Take 20 mg by mouth daily.  11/17/16  Yes Troy Sine, MD  triamcinolone cream (KENALOG) 0.1 % Apply 1 application topically as needed (for itching).    Yes [provider]  Turmeric 500 MG CAPS Take 1,000 mg by mouth daily.   Yes [provider]  zolpidem (AMBIEN) 10 MG tablet Take 5-10 mg by mouth at bedtime.  07/05/17  Yes [provider]    Physical Exam: Vitals:   05/24/18 1845 05/24/18 1847 05/24/18 1930 05/24/18 1945  BP: (!) 153/64 (!) 153/64 (!) 159/72 (!) 157/73  Pulse: (!) 56 (!) 56 (!) 55 (!) 51  Resp: 18 19 16  (!) 22  Temp:      TempSrc:      SpO2: 95% 97% 95% 95%      Constitutional: Moderately built and nourished. Vitals:   05/24/18 1845 05/24/18 1847 05/24/18 1930 05/24/18 1945  BP: (!) 153/64 (!) 153/64 (!) 159/72 (!) 157/73  Pulse: (!) 56 (!) 56 (!) 55 (!) 51  Resp: 18 19 16  (!) 22  Temp:      TempSrc:      SpO2: 95% 97% 95% 95%   Eyes: Anicteric no pallor. ENMT: No discharge from the ears eyes nose or mouth. Neck: No mass felt.  No neck rigidity but no JVD appreciated. Respiratory: No rhonchi or crepitations. Cardiovascular: S1-S2 heard. Abdomen: Soft nontender bowel sounds present. Musculoskeletal: swelling from the thigh  downwards to the foot on the right. Skin: No rash. Neurologic: Alert awake oriented to time place and person.  Moves all extremities. Psychiatric: Appears normal.   Labs on Admission: I have personally reviewed following labs and imaging studies  CBC: Recent Labs  Lab 05/24/18 1737  WBC 17.6*  NEUTROABS 14.2*  HGB 14.6  HCT 45.5  MCV 90.5  PLT 166   Basic Metabolic Panel: Recent Labs  Lab 05/24/18 1737  NA 139  K 3.5  CL 105  CO2 22  GLUCOSE 116*  BUN 21  CREATININE 1.19  CALCIUM 9.2   GFR: Estimated Creatinine Clearance: 61.3 mL/min (by C-G formula based on SCr of 1.19 mg/dL). Liver Function Tests: No results for input(s): AST, ALT, ALKPHOS, BILITOT, PROT, ALBUMIN in the last 168 hours. No results for input(s): LIPASE, AMYLASE in the last 168 hours. No results for input(s): AMMONIA in the last 168 hours. Coagulation Profile: No results for input(s): INR, PROTIME in the last 168 hours. Cardiac Enzymes: No results for input(s): CKTOTAL, CKMB, CKMBINDEX, TROPONINI in the last 168 hours. BNP (last 3 results) No results for input(s): PROBNP in the last 8760 hours. HbA1C: No results for input(s): HGBA1C in the last 72 hours. CBG: No results for input(s): GLUCAP in the last 168 hours. Lipid Profile: No results for input(s): CHOL, HDL, LDLCALC, TRIG, CHOLHDL, LDLDIRECT in the last 72 hours. Thyroid Function Tests: No results for input(s): TSH, T4TOTAL, FREET4, T3FREE, THYROIDAB in the last 72 hours. Anemia Panel: No results  for input(s): VITAMINB12, FOLATE, FERRITIN, TIBC, IRON, RETICCTPCT in the last 72 hours. Urine analysis:    Component Value Date/Time   COLORURINE YELLOW 09/03/2016 0025   APPEARANCEUR CLEAR 09/03/2016 0025   LABSPEC 1.020 09/03/2016 0025   PHURINE 5.0 09/03/2016 0025   GLUCOSEU NEGATIVE 09/03/2016 0025   HGBUR NEGATIVE 09/03/2016 0025   BILIRUBINUR NEGATIVE 09/03/2016 0025   KETONESUR NEGATIVE 09/03/2016 0025   PROTEINUR NEGATIVE  09/03/2016 0025   UROBILINOGEN 0.2 09/11/2012 0658   NITRITE NEGATIVE 09/03/2016 0025   LEUKOCYTESUR NEGATIVE 09/03/2016 0025   Sepsis Labs: @LABRCNTIP (procalcitonin:4,lacticidven:4) )No results found for this or any previous visit (from the past 240 hour(s)).   Radiological Exams on Admission: Ct Angio Chest Pe W/cm &/or Wo Cm  Result Date: 05/24/2018 CLINICAL DATA:  Patient diagnosed with deep venous thrombosis today. Intermittent shortness of breath. Cigarette smoker. EXAM: CT ANGIOGRAPHY CHEST WITH CONTRAST TECHNIQUE: Multidetector CT imaging of the chest was performed using the standard protocol during bolus administration of intravenous contrast. Multiplanar CT image reconstructions and MIPs were obtained to evaluate the vascular anatomy. CONTRAST:  100 mL ISOVUE-370 IOPAMIDOL (ISOVUE-370) INJECTION 76% COMPARISON:  PA and lateral chest 09/10/2017. FINDINGS: Cardiovascular: Small nonocclusive emboli are seen in pulmonary arteries bilaterally. No occlusive thrombus is identified. No evidence of right heart strain. There is cardiomegaly. The patient is status post CABG. No pericardial effusion. Aortic atherosclerosis noted. Mediastinum/Nodes: No enlarged mediastinal, hilar, or axillary lymph nodes. Thyroid gland, trachea, and esophagus demonstrate no significant findings. Lungs/Pleura: Mild dependent atelectasis. No infarct, nodule, mass or consolidative process. Upper Abdomen: Mild dependent atelectasis is seen. Musculoskeletal: No lytic or sclerotic lesion. Lower thoracic and upper lumbar spondylosis noted. Review of the MIP images confirms the above findings. IMPRESSION: Small bilateral nonocclusive pulmonary emboli. Negative for evidence of right heart strain. Cardiomegaly. Critical Value/emergent results were called by telephone at the time of interpretation on 05/24/2018 at 6:59 pm to Alva., who verbally acknowledged these results. Aortic Atherosclerosis (ICD10-I70.0).  Electronically Signed   By: Inge Rise M.D.   On: 05/24/2018 19:01   Vas [place] Lower Extremity Venous (dvt)  Result Date: 05/24/2018  Lower Venous Study Other Indications: Pain and edema of the right leg since Friday 05/20/17. Risk Factors: None identified. Performing Technologist: Chesley Noon RVT  Examination Guidelines: A complete evaluation includes B-mode imaging, spectral Doppler, color Doppler, and power Doppler as needed of all accessible portions of each vessel. Bilateral testing is considered an integral part of a complete examination. Limited examinations for reoccurring indications may be performed as noted.  Right Venous Findings: +---------+---------------+---------+-----------+--------------------+-------+          CompressibilityPhasicitySpontaneityProperties          Summary +---------+---------------+---------+-----------+--------------------+-------+ CFV      Full           Yes      Yes                                    +---------+---------------+---------+-----------+--------------------+-------+ SFJ      Full           Yes      Yes                                    +---------+---------------+---------+-----------+--------------------+-------+ FV Prox  Partial        No  Yes        spongy w/compressionAcute   +---------+---------------+---------+-----------+--------------------+-------+ FV Mid   Partial        No       Yes        spongy w/compressionAcute   +---------+---------------+---------+-----------+--------------------+-------+ FV DistalNone           No       No         softly echogenic    Acute   +---------+---------------+---------+-----------+--------------------+-------+ PFV      Full                                                           +---------+---------------+---------+-----------+--------------------+-------+ POP      None           No       No         softly echogenic             +---------+---------------+---------+-----------+--------------------+-------+ PTV      None           No       No         dilated             Acute   +---------+---------------+---------+-----------+--------------------+-------+ PERO     None           No       No         dilated             Acute   +---------+---------------+---------+-----------+--------------------+-------+ Gastroc  Full                                                           +---------+---------------+---------+-----------+--------------------+-------+ GSV      Full                                                           +---------+---------------+---------+-----------+--------------------+-------+  Left Venous Findings: +---------+---------------+---------+-----------+----------+-------+          CompressibilityPhasicitySpontaneityPropertiesSummary +---------+---------------+---------+-----------+----------+-------+ CFV      Full           Yes      Yes                          +---------+---------------+---------+-----------+----------+-------+ SFJ      Full           Yes      Yes                          +---------+---------------+---------+-----------+----------+-------+ FV Prox  Full           Yes      Yes                          +---------+---------------+---------+-----------+----------+-------+ FV Mid   Full  Yes      Yes                          +---------+---------------+---------+-----------+----------+-------+ FV DistalFull           Yes      Yes                          +---------+---------------+---------+-----------+----------+-------+ PFV      Full                                                 +---------+---------------+---------+-----------+----------+-------+ POP      Full           Yes      Yes                          +---------+---------------+---------+-----------+----------+-------+ PTV      Full           Yes      Yes                           +---------+---------------+---------+-----------+----------+-------+ PERO     Full           Yes      Yes                          +---------+---------------+---------+-----------+----------+-------+ Gastroc  Full                                                 +---------+---------------+---------+-----------+----------+-------+ GSV      Full           Yes      Yes                          +---------+---------------+---------+-----------+----------+-------+   Findings reported directly to Kerin Ransom at 3:15 pm.  Summary: Right: Findings consistent with acute deep vein thrombosis involving the right femoral vein, right popliteal vein, right posterior tibial vein, and right peroneal vein. No cystic structure found in the popliteal fossa. Left: No evidence of deep vein thrombosis in the lower extremity. No indirect evidence of obstruction proximal to the inguinal ligament.  *See table(s) above for measurements and observations. Electronically signed by Ena Dawley MD on 05/24/2018 at 3:25:47 PM.    Final     EKG: Independently reviewed.  Sinus rhythm with PVCs.  Assessment/Plan Principal Problem:   Pulmonary embolism (HCC) Active Problems:   CAD- CABG X 17 August 2012   PVD- multiple LE interventions   Bradycardia   PAF (paroxysmal atrial fibrillation) (HCC)   Obstructive chronic bronchitis without exacerbation (HCC)   DVT of lower limb, acute ([place])    1. Bilateral pulmonary embolism small in size with nonocclusive with extensive DVT right lower extremity -appears to be unprovoked.  Has not had any previous history of PE or DVT and has not recently traveled has not had any recent surgery.  Does smoke cigarettes which I have advised patient to quit.  Patient states he has had screening  colonoscopy in the last few years which was unremarkable.  If patient continued to remain stable change heparin to apixaban.  We will cycle cardiac markers overnight.  May  discuss with cardiologist about patient taking aspirin and Plavix and being on anticoagulation now. 2. CAD status post CABG on aspirin Plavix Imdur statins Bystolic.  May discuss with cardiologist in the morning about patient being on anticoagulation and aspirin Plavix if anyone needs to be discontinued. 3. History of bradycardia closely monitor in telemetry patient does take Bystolic. 4. COPD recently been treated for bronchitis with prednisone and antibiotics. 5. Leukocytosis likely from recent prednisone therapy. 6. Hypothyroidism on Synthroid. 7. Tobacco abuse -advised to quit smoking. 8. Hypertension on Cozaar Imdur and Bystolic.   DVT prophylaxis: Heparin. Code Status: Full code. Family Communication: Discussed with patient. Disposition Plan: Home. Consults called: None. Admission status: Observation.   Rise Patience MD [facility] Pager [phone].  If 7PM-7AM, please contact night-coverage [email or web address] Password [facility]  05/24/2018, 10:38 PM

## 2018-05-24 NOTE — Progress Notes (Addendum)
05/24/2018 Brian Brock   10-30-1945  [id number]  Primary Physician [name], MD Primary Cardiologist: Dr Claiborne Billings  HPI: [name] is a pleasant 73 year old male followed by Dr. Claiborne Billings.  He has a history of coronary disease and vascular disease.  He had bypass grafting in 20142 with an off-pump LIMA to LAD, and SVG to OM1.  In 2016 he had a PCI with DES to his native LAD.  Myoview done in 2018 was low risk.  He has had several vascular procedures in the past, his last was a right SFA PTA in September 2017.  Most recent Dopplers show ABIs within normal limits.  The patient is in the office today because of complaints of right leg swelling.  He noticed this about 3 days ago.  He is also had some calf pain.  He denies any hemoptysis but admits at times he gets short of breath with some exertion.  He denies orthopnea.  He has occasional chest pain but this is infrequent and not associated with activity.  On exam he does have some edema and calf tenderness in the right leg.  The patient was concerned it had something to do with his stents but he does have a pedal pulse.  I am more concerned about a DVT.  He denies any recent prolonged car trips or plane rides although he does say that he sits at a desk for hours at a time at work.   Current Outpatient Medications  Medication Sig Dispense Refill  . albuterol (PROAIR HFA) 108 (90 Base) MCG/ACT inhaler Inhale 2 puffs every 6 hours as needed 1 Inhaler 12  . aspirin EC 81 MG tablet Take 81 mg by mouth daily.     [name] azithromycin (ZITHROMAX) 250 MG tablet Take 250 mg by mouth daily.    . Biotin 1000 MCG tablet Take 1,000 mcg by mouth daily.    [name] BYSTOLIC 5 MG tablet TAKE 1/2 TABLET ONCE DAILY 30 tablet 6  . cetirizine (ZYRTEC) 10 MG tablet Take 10 mg by mouth daily.     . cholestyramine (QUESTRAN) 4 G packet Take 1 packet by mouth 2 (two) times daily with a meal. 60 each 11  . clopidogrel (PLAVIX) 75 MG tablet TAKE ONE TABLET BY MOUTH EVERY  DAY 90 tablet 3  . isosorbide mononitrate (IMDUR) 30 MG 24 hr tablet TAKE 1/2 TABLET BY MOUTH EVERY DAY 45 tablet 2  . levothyroxine (SYNTHROID, LEVOTHROID) 25 MCG tablet Take 25 mcg by mouth daily before breakfast.     . losartan (COZAAR) 100 MG tablet TAKE ONE TABLET BY MOUTH EVERY DAY (Patient taking differently: TAKE ONE HALF TABLET BY MOUTH EVERY DAY) 30 tablet 11  . nitroGLYCERIN (NITROSTAT) 0.4 MG SL tablet PLACE 1 TABLET UNDER THE TONGUE, MAY REPEAT EVERY 5 MINUTES MAX 3 TABLETS 25 tablet 11  . pantoprazole (PROTONIX) 40 MG tablet Take 1 tablet (40 mg total) by mouth 2 (two) times daily. 60 tablet 0  . predniSONE (DELTASONE) 20 MG tablet Take 40 mg by mouth daily with breakfast.    . Probiotic Product (PROBIOTIC PO) Take 1 tablet by mouth daily.    . rosuvastatin (CRESTOR) 20 MG tablet TAKE ONE TABLET BY MOUTH EVERY DAY 90 tablet 3  . triamcinolone cream (KENALOG) 0.1 % Apply 1 application topically as directed.    . TURMERIC PO Take 1,000 mg by mouth daily.    [name] zolpidem (AMBIEN) 10 MG tablet Take 1 tablet by mouth at bedtime  as needed.     Current Facility-Administered Medications  Medication Dose Route Frequency Provider Last Rate Last Dose  . 0.9 %  sodium chloride infusion  500 mL Intravenous Continuous Jackquline Denmark, MD        Allergies  Allergen Reactions  . Metoprolol Other (See Comments)    Fatigue and malaise  . Tricor [Fenofibrate] Other (See Comments)    Tired/weak  . Fenofibrate Micronized     UNSPECIFIED REACTION   . Levofloxacin     UNSPECIFIED REACTION   . Metaxalone Other (See Comments)    Unknown reaction  . Niaspan [Niacin]     UNSPECIFIED REACTION   . Fish Oil Rash  . Meloxicam Other (See Comments)    Achy, fatigue  . Sulfadiazine Hives and Rash    Past Medical History:  Diagnosis Date  . AF (atrial fibrillation) ([place])    happened after CABG, has not had it since  . Allergic rhinitis   . Allergy   . Anginal pain ([place]) 1997  . Arthritis     "lower back" (09/26/2015)  . Childhood asthma   . Chronic bronchitis ([place])    Yearly  . COPD (chronic obstructive pulmonary disease) ([place])   . Coronary artery disease    Multiple stents  . Dyslipidemia   . Dysrhythmia   . Femoral hernia of right side 12/28/2016  . GERD (gastroesophageal reflux disease)   . History of hiatal hernia   . Hyperlipemia   . Hypertension   . Hypothyroidism   . Irritable bowel syndrome   . Myocardial infarct ([place]) 1997   "mild"  . Peripheral vascular disease ([place])    prior stenting  . Restless legs     Social History   Socioeconomic History  . Marital status: Married    Spouse name: Not on file  . Number of children: Not on file  . Years of education: Not on file  . Highest education level: Not on file  Occupational History  . Not on file  Social Needs  . Financial resource strain: Not on file  . Food insecurity:    Worry: Not on file    Inability: Not on file  . Transportation needs:    Medical: Not on file    Non-medical: Not on file  Tobacco Use  . Smoking status: Current Every Day Smoker    Packs/day: 0.50    Years: 56.00    Pack years: 28.00    Types: Cigarettes    Last attempt to quit: 03/25/2016    Years since quitting: 2.1  . Smokeless tobacco: Former Systems developer    Types: Chew  Substance and Sexual Activity  . Alcohol use: Yes    Alcohol/week: 1.0 standard drinks    Types: 1 Standard drinks or equivalent per week    Comment: 09/26/2015 "varies; might have a few drinks q 2 months"  . Drug use: No  . Sexual activity: Not Currently  Lifestyle  . Physical activity:    Days per week: Not on file    Minutes per session: Not on file  . Stress: Not on file  Relationships  . Social connections:    Talks on phone: Not on file    Gets together: Not on file    Attends religious service: Not on file    Active member of club or organization: Not on file    Attends meetings of clubs or organizations: Not on file    Relationship status:  Not on file  .  Intimate partner violence:    Fear of current or ex partner: Not on file    Emotionally abused: Not on file    Physically abused: Not on file    Forced sexual activity: Not on file  Other Topics Concern  . Not on file  Social History Narrative  . Not on file     Family History  Problem Relation Age of Onset  . Coronary artery disease Father 20  . Heart attack Father   . Coronary artery disease Brother 76       CABG in 2000  . Colon cancer Neg Hx   . Esophageal cancer Neg Hx   . Rectal cancer Neg Hx   . Stomach cancer Neg Hx      Review of Systems: General: negative for chills, fever, night sweats or weight changes.  Cardiovascular: negative for chest pain, dyspnea on exertion, orthopnea, palpitations, paroxysmal nocturnal dyspnea or shortness of breath Dermatological: negative for rash Respiratory: negative for cough or wheezing Urologic: negative for hematuria Abdominal: negative for nausea, vomiting, diarrhea, bright red blood per rectum, melena, or hematemesis Neurologic: negative for visual changes, syncope, or dizziness All other systems reviewed and are otherwise negative except as noted above.    Blood pressure 116/60, pulse 60, height 5\' 9"  (1.753 m), weight 191 lb 9.6 oz (86.9 kg).  General appearance: alert, cooperative and no distress Lungs: few rhonchi on Lt Heart: regular rate and rhythm Extremities: intact distal pulses. He has 1+ RLE edema and mild Rt calf tenderness Skin: warm and dry Neurologic: Grossly normal   ASSESSMENT AND PLAN:   Leg edema, right R/O DVT  CAD- CABG X 17 August 2012 Off pump LIMA-LAD and SVG-OM LAD PCI with DES March 2016 Myoview low risk April 2014  PVD- multiple LE interventions Last PTA was to Rt SFA sept 2017 Dopplers April 2019-bilateral ABI within normal range  Hyperlipidemia LDL goal <70 LDL 63 Sept 2019  Essential hypertension Controlled. EF 58% at Myoview 2018  Bradycardia Chronic  COPD  (chronic obstructive pulmonary disease) (HCC) Smoker- followed by Dr Jacqlyn Krauss  Dopplers today-  Kerin Ransom PA-C 05/24/2018 11:31 AM   Addendum: Venous dopplers showed acute deep vein thrombosis involving the right femoral vein, right popliteal vein, right posterior tibial vein, and right peroneal vein.  The patient now tells me he has had increase DOE over the last week or so. No resting SOB. No chest pain.  No hemoptysis. Discussed with Dr Gwenlyn Found. The patient needs to go to the ED for CTA. If negative for PE Dr Gwenlyn Found recommends Xarelto DVT Rx-(15 mg BID x 3 weeks, then 20 mg daily) and stop Plavix, continue ASA 81 mg.   If he has a PE will defer Rx to the hospital team.

## 2018-05-24 NOTE — Assessment & Plan Note (Signed)
Controlled. EF 58% at Myoview 2018

## 2018-05-24 NOTE — Progress Notes (Signed)
ANTICOAGULATION CONSULT NOTE - Initial Consult  Pharmacy Consult for heparin Indication: pulmonary embolus  Allergies  Allergen Reactions  . Metoprolol Other (See Comments)    Fatigue and malaise  . Tricor [Fenofibrate] Other (See Comments)    Tired/weak  . Fenofibrate Micronized     UNSPECIFIED REACTION   . Levofloxacin     UNSPECIFIED REACTION   . Metaxalone Other (See Comments)    Unknown reaction  . Niaspan [Niacin]     UNSPECIFIED REACTION   . Fish Oil Rash  . Meloxicam Other (See Comments)    Achy, fatigue  . Sulfadiazine Hives and Rash    Patient Measurements:   Heparin Dosing Weight: 86.9 kg  Vital Signs: Temp: 97.8 F (36.6 C) (01/07 1607) Temp Source: Oral (01/07 1607) BP: 153/64 (01/07 1847) Pulse Rate: 56 (01/07 1847)  Labs: Recent Labs    05/24/18 1737  HGB 14.6  HCT 45.5  PLT 226  CREATININE 1.19    Estimated Creatinine Clearance: 61.3 mL/min (by C-G formula based on SCr of 1.19 mg/dL).   Medical History: Past Medical History:  Diagnosis Date  . AF (atrial fibrillation) ([place])    happened after CABG, has not had it since  . Allergic rhinitis   . Allergy   . Anginal pain ([place]) 1997  . Arthritis    "lower back" (09/26/2015)  . Childhood asthma   . Chronic bronchitis ([place])    Yearly  . COPD (chronic obstructive pulmonary disease) ([place])   . Coronary artery disease    Multiple stents  . Dyslipidemia   . Dysrhythmia   . Femoral hernia of right side 12/28/2016  . GERD (gastroesophageal reflux disease)   . History of hiatal hernia   . Hyperlipemia   . Hypertension   . Hypothyroidism   . Irritable bowel syndrome   . Myocardial infarct ([place]) 1997   "mild"  . Peripheral vascular disease ([place])    prior stenting  . Restless legs     Medications:  Scheduled:  . heparin  4,400 Units Intravenous Once    Assessment: 24 yom presenting with R leg swelling and calf pain. Venous duplex showing acute DVT involving R femoral, popliteal,  posterior tibial, and peroneal vein. CT chest small bilateral non-occlusive PE without R heart strain.   Hgb 14.6, plt 226. No s/sx of bleeding.   Goal of Therapy:  Heparin level 0.3-0.7 units/ml Monitor platelets by anticoagulation protocol: Yes   Plan:  Give 4400 units bolus x 1 Start heparin infusion at 1400 units/hr Check anti-Xa level in 8 hours and daily while on heparin Continue to monitor H&H and platelets  Antonietta Jewel, PharmD, BCCCP Clinical Pharmacist  Pager: [phone] Phone: [phone] 05/24/2018,7:27 PM

## 2018-05-24 NOTE — Addendum Note (Signed)
Addended by: Erlene Quan on: 05/24/2018 03:33 PM   Modules accepted: Level of Service

## 2018-05-25 ENCOUNTER — Encounter (HOSPITAL_COMMUNITY): Payer: Self-pay | Admitting: General Practice

## 2018-05-25 ENCOUNTER — Observation Stay (HOSPITAL_BASED_OUTPATIENT_CLINIC_OR_DEPARTMENT_OTHER): Payer: Medicare Other

## 2018-05-25 ENCOUNTER — Other Ambulatory Visit: Payer: Self-pay

## 2018-05-25 DIAGNOSIS — I824Y1 Acute embolism and thrombosis of unspecified deep veins of right proximal lower extremity: Secondary | ICD-10-CM

## 2018-05-25 DIAGNOSIS — I2699 Other pulmonary embolism without acute cor pulmonale: Secondary | ICD-10-CM | POA: Diagnosis not present

## 2018-05-25 DIAGNOSIS — I2510 Atherosclerotic heart disease of native coronary artery without angina pectoris: Secondary | ICD-10-CM | POA: Diagnosis not present

## 2018-05-25 DIAGNOSIS — R001 Bradycardia, unspecified: Secondary | ICD-10-CM | POA: Diagnosis not present

## 2018-05-25 DIAGNOSIS — J449 Chronic obstructive pulmonary disease, unspecified: Secondary | ICD-10-CM

## 2018-05-25 DIAGNOSIS — I739 Peripheral vascular disease, unspecified: Secondary | ICD-10-CM

## 2018-05-25 DIAGNOSIS — R0602 Shortness of breath: Secondary | ICD-10-CM

## 2018-05-25 DIAGNOSIS — I480 Paroxysmal atrial fibrillation: Secondary | ICD-10-CM

## 2018-05-25 LAB — BASIC METABOLIC PANEL
Anion gap: 9 (ref 5–15)
BUN: 19 mg/dL (ref 8–23)
CO2: 22 mmol/L (ref 22–32)
Calcium: 8.6 mg/dL — ABNORMAL LOW (ref 8.9–10.3)
Chloride: 109 mmol/L (ref 98–111)
Creatinine, Ser: 1.09 mg/dL (ref 0.61–1.24)
GFR calc Af Amer: >60 mL/min (ref >60)
GFR calc non Af Amer: >60 mL/min (ref >60)
Glucose, Bld: 117 mg/dL — ABNORMAL HIGH (ref 70–99)
Potassium: 3.7 mmol/L (ref 3.5–5.1)
Sodium: 140 mmol/L (ref 135–145)

## 2018-05-25 LAB — HEPARIN LEVEL (UNFRACTIONATED): Heparin Unfractionated: 0.97 IU/mL — ABNORMAL HIGH (ref 0.30–0.70)

## 2018-05-25 LAB — CBC
HCT: 39.7 % (ref 39.0–52.0)
Hemoglobin: 13.3 g/dL (ref 13.0–17.0)
MCH: 29.4 pg (ref 26.0–34.0)
MCHC: 33.5 g/dL (ref 30.0–36.0)
MCV: 87.8 fL (ref 80.0–100.0)
Platelets: 202 10*3/uL (ref 150–400)
RBC: 4.52 MIL/uL (ref 4.22–5.81)
RDW: 14.4 % (ref 11.5–15.5)
WBC: 14.3 10*3/uL — ABNORMAL HIGH (ref 4.0–10.5)
nRBC: 0 % (ref 0.0–0.2)

## 2018-05-25 LAB — ECHOCARDIOGRAM COMPLETE
Height: 69 in
Weight: 3065.6 oz

## 2018-05-25 LAB — TROPONIN I
Troponin I: <0.03 ng/mL (ref <0.03)
Troponin I: <0.03 ng/mL (ref <0.03)
Troponin I: <0.03 ng/mL (ref <0.03)

## 2018-05-25 MED ORDER — RIVAROXABAN (XARELTO) VTE STARTER PACK (15 & 20 MG): ORAL_TABLET | ORAL | 0 refills | Status: DC

## 2018-05-25 MED ORDER — CHOLESTYRAMINE 4 G PO PACK: 2.0000 g | PACK | Freq: Two times a day (BID) | ORAL | 12 refills | Status: DC

## 2018-05-25 MED ORDER — LOSARTAN POTASSIUM 50 MG PO TABS: 50.0000 mg | ORAL_TABLET | Freq: Every day | ORAL | 0 refills | Status: DC

## 2018-05-25 MED ORDER — RIVAROXABAN 15 MG PO TABS
15.0000 mg | ORAL_TABLET | Freq: Two times a day (BID) | ORAL | Status: DC
  Administered 2018-05-25: 15 mg via ORAL
  Filled 2018-05-25 (×3): qty 1

## 2018-05-25 MED ORDER — FLUCONAZOLE 150 MG PO TABS
150.0000 mg | ORAL_TABLET | ORAL | Status: DC
  Administered 2018-05-25: 150 mg via ORAL
  Filled 2018-05-25: qty 1

## 2018-05-25 MED ORDER — RIVAROXABAN (XARELTO) EDUCATION KIT FOR DVT/PE PATIENTS
PACK | Freq: Once | Status: DC
  Filled 2018-05-25: qty 1

## 2018-05-25 MED ORDER — RIVAROXABAN 20 MG PO TABS: 20.0000 mg | ORAL_TABLET | Freq: Every day | ORAL | Status: DC

## 2018-05-25 MED ORDER — FLUCONAZOLE 150 MG PO TABS: 150.0000 mg | ORAL_TABLET | ORAL | 0 refills | Status: DC

## 2018-05-25 NOTE — Discharge Summary (Addendum)
Physician Discharge Summary  Brian Brock GXQ:[id number] DOB: [birth year] DOA: 05/24/2018  PCP: [name], MD  Admit date: 05/24/2018 Discharge date: 05/25/2018  Recommendations for Outpatient Follow-up:   Follow-up Information    [name], MD Follow up.   Specialty:  Family Medicine Contact information: [place] [address] [phone]        Troy Sine, MD .   Specialty:  Cardiology Contact information: [address] [place] [address] [phone]          Discharge Diagnoses:  1. Bilateral pulmonary embolus and DVT of right lower extremity 2. Coronary artery disease status post CABG 3. Bradycardia 4. COPD without acute exacerbation 5. Leukocytosis 6. Hypothyroidism 7. Tobacco abuse 8. Essential hypertension  Discharge Condition: Good Disposition: Home  Diet recommendation: Heart healthy  There were no vitals filed for this visit.  History of present illness:  Brian Brock is a 73 y.o. male with history of CAD status post CABG and stenting, hypertension, COPD, ongoing tobacco abuse, and hypothyroidism; who had followed up with his cardiology office and at that time a complaint of right lower extremity swelling.  Dopplers ultrasound performed showed extensive DVT involving the right lower extremity from femoral vein downwards.  Since patient also had some shortness of breath which has been ongoing for last 1 month patient was referred to the ER.  Patient also recently was treated for bronchitis with antibiotics and prednisone.  Patient is also on fluconazole for toenail infection.  Denies any chest pain.  Shortness of breath is present at rest and sometimes increased on exertion  Hospital Course:  1. Bilateral pulmonary embolus and extensive DVT of the right lower extremity: Acute.  CT angiogram of the chest showed bilateral pulmonary emboli nonocclusive and small in size.  Symptoms appear to have  been unprovoked no previous history of DVT/PE noted.  Patient does smoke cigarettes.  Initially patient was placed on a heparin drip.  Cardiology was called and advised to review of cardiology office records which appears to recommend to place patient on Xarelto and to discontinue Plavix.  Patient was given his first 2 doses of Xarelto in the hospital and given a prescription for Xarelto continuation at discharge.  2. CAD status post CABG in 2014, peripheral vascular disease: As previously stated patient was advised to stop Plavix and continue on aspirin along with anticoagulation.  3. Bradycardia: Chronic.  Patient's heart rates was seen at rest and 40s.  Otherwise patient appears asymptomatic.  Bystolic was continued.  4. COPD, recently treated for bronchitis: Advised patient to complete out course of antibiotics azithromycin and prednisone.  5. PAF: Patient was noted to be in atrial fibrillation following his CABG, but no history of recurrence.  6. Hypothyroidism: Continue Synthroid  7. Tobacco abuse: Advised patient to quit smoking.  8.  Essential hypertension: Continued Cozaar, imdur, and diastolic   Discharge Instructions  Discharge Instructions    Diet - low sodium heart healthy   Complete by:  As directed    Discharge instructions   Complete by:  As directed    Please call and set up follow-up appointment with your primary care provider within 1 week. Due to the blood clot that was found we have started you on Xarelto.   Please be advised to stop taking Plavix, but continue with a daily aspirin.   Increase activity slowly   Complete by:  As directed      Allergies as of  05/25/2018      Reactions   Other Anaphylaxis, Shortness Of Breath, Swelling   [place] nuts- "will kill me"   Metoprolol Other (See Comments)   Fatigue and malaise   Tricor [fenofibrate] Other (See Comments)   Tired/weak   Clarithromycin Nausea Only   GI upset   Fenofibrate Micronized Other (See Comments)    Caused weakness/lethargy   Levofloxacin Other (See Comments)   UNSPECIFIED REACTION ??   Metaxalone Other (See Comments)   Unknown reaction ??   Niaspan [niacin]    UNSPECIFIED REACTION    Fish Oil Rash   Meloxicam Other (See Comments)   Achy, fatigue   Sulfadiazine Hives, Rash      Medication List    STOP taking these medications   clopidogrel 75 MG tablet Commonly known as:  PLAVIX   predniSONE 20 MG tablet Commonly known as:  DELTASONE     TAKE these medications   albuterol 108 (90 Base) MCG/ACT inhaler Commonly known as:  PROAIR HFA Inhale 2 puffs every 6 hours as needed What changed:    how much to take  how to take this  when to take this  reasons to take this  additional instructions   aspirin EC 81 MG tablet Take 81 mg by mouth daily.   azithromycin 250 MG tablet Commonly known as:  ZITHROMAX Take 250-500 mg by mouth See admin instructions. Take 500 mg by mouth on day one, then 250 mg once a day on days 2-5   Biotin 1000 MCG tablet Take 1,000 mcg by mouth daily.   BYSTOLIC 5 MG tablet Generic drug:  nebivolol TAKE 1/2 TABLET ONCE DAILY What changed:  how much to take   cetirizine 10 MG tablet Commonly known as:  ZYRTEC Take 10 mg by mouth daily.   cholestyramine 4 g packet Commonly known as:  QUESTRAN Take 0.5 packets (2 g total) by mouth 2 (two) times daily with a meal.   cyclobenzaprine 10 MG tablet Commonly known as:  FLEXERIL Take 10 mg by mouth 3 (three) times daily as needed for muscle spasms.   fluconazole 150 MG tablet Commonly known as:  DIFLUCAN Take 1 tablet (150 mg total) by mouth 2 (two) times a week. Wednesday and Saturday Start taking on:  May 26, 2018 Notes to patient:  Wednesday and Saturday only   fluticasone 50 MCG/ACT nasal spray Commonly known as:  FLONASE Place 2 sprays into both nostrils daily as needed for allergies or rhinitis.   ipratropium 0.03 % nasal spray Commonly known as:  ATROVENT Place 1-2  sprays into both nostrils 2 (two) times daily as needed for rhinitis.   isosorbide mononitrate 30 MG 24 hr tablet Commonly known as:  IMDUR TAKE 1/2 TABLET BY MOUTH EVERY DAY   levothyroxine 25 MCG tablet Commonly known as:  SYNTHROID, LEVOTHROID Take 25 mcg by mouth daily before breakfast.   loperamide 2 MG tablet Commonly known as:  IMODIUM A-D Take 2 mg by mouth 4 (four) times daily as needed for diarrhea or loose stools.   losartan 50 MG tablet Commonly known as:  COZAAR Take 1 tablet (50 mg total) by mouth daily. Start taking on:  May 26, 2018 What changed:    medication strength  how much to take   nitroGLYCERIN 0.4 MG SL tablet Commonly known as:  NITROSTAT PLACE 1 TABLET UNDER THE TONGUE, MAY REPEAT EVERY 5 MINUTES MAX 3 TABLETS What changed:  See the new instructions.   pantoprazole 40 MG tablet  Commonly known as:  PROTONIX Take 1 tablet (40 mg total) by mouth 2 (two) times daily. What changed:  when to take this   PROBIOTIC PO Take 1 capsule by mouth daily.   Rivaroxaban 15 & 20 MG Tbpk Take as directed on package: Start with one 15mg  tablet by mouth twice a day with food. On Day 22, switch to one 20mg  tablet once a day with food.   rosuvastatin 20 MG tablet Commonly known as:  CRESTOR TAKE ONE TABLET BY MOUTH EVERY DAY   triamcinolone cream 0.1 % Commonly known as:  KENALOG Apply 1 application topically as needed (for itching).   Turmeric 500 MG Caps Take 1,000 mg by mouth daily.   zolpidem 10 MG tablet Commonly known as:  AMBIEN Take 5-10 mg by mouth at bedtime.      Allergies  Allergen Reactions  . Other Anaphylaxis, Shortness Of Breath and Swelling    [place] nuts- "will kill me"  . Metoprolol Other (See Comments)    Fatigue and malaise  . Tricor [Fenofibrate] Other (See Comments)    Tired/weak  . Clarithromycin Nausea Only    GI upset  . Fenofibrate Micronized Other (See Comments)    Caused weakness/lethargy  . Levofloxacin Other  (See Comments)    UNSPECIFIED REACTION ??  Janace Hoard Other (See Comments)    Unknown reaction ??  . Niaspan [Niacin]     UNSPECIFIED REACTION   . Fish Oil Rash  . Meloxicam Other (See Comments)    Achy, fatigue  . Sulfadiazine Hives and Rash    The results of significant diagnostics from this hospitalization (including imaging, microbiology, ancillary and laboratory) are listed below for reference.    Significant Diagnostic Studies: Ct Angio Chest Pe W/cm &/or Wo Cm  Result Date: 05/24/2018 CLINICAL DATA:  Patient diagnosed with deep venous thrombosis today. Intermittent shortness of breath. Cigarette smoker. EXAM: CT ANGIOGRAPHY CHEST WITH CONTRAST TECHNIQUE: Multidetector CT imaging of the chest was performed using the standard protocol during bolus administration of intravenous contrast. Multiplanar CT image reconstructions and MIPs were obtained to evaluate the vascular anatomy. CONTRAST:  100 mL ISOVUE-370 IOPAMIDOL (ISOVUE-370) INJECTION 76% COMPARISON:  PA and lateral chest 09/10/2017. FINDINGS: Cardiovascular: Small nonocclusive emboli are seen in pulmonary arteries bilaterally. No occlusive thrombus is identified. No evidence of right heart strain. There is cardiomegaly. The patient is status post CABG. No pericardial effusion. Aortic atherosclerosis noted. Mediastinum/Nodes: No enlarged mediastinal, hilar, or axillary lymph nodes. Thyroid gland, trachea, and esophagus demonstrate no significant findings. Lungs/Pleura: Mild dependent atelectasis. No infarct, nodule, mass or consolidative process. Upper Abdomen: Mild dependent atelectasis is seen. Musculoskeletal: No lytic or sclerotic lesion. Lower thoracic and upper lumbar spondylosis noted. Review of the MIP images confirms the above findings. IMPRESSION: Small bilateral nonocclusive pulmonary emboli. Negative for evidence of right heart strain. Cardiomegaly. Critical Value/emergent results were called by telephone at the time of  interpretation on 05/24/2018 at 6:59 pm to [place]., who verbally acknowledged these results. Aortic Atherosclerosis (ICD10-I70.0). Electronically Signed   By: Inge Rise M.D.   On: 05/24/2018 19:01   Vas [place] Lower Extremity Venous (dvt)  Result Date: 05/24/2018  Lower Venous Study Other Indications: Pain and edema of the right leg since Friday 05/20/17. Risk Factors: None identified. Performing Technologist: Chesley Noon RVT  Examination Guidelines: A complete evaluation includes B-mode imaging, spectral Doppler, color Doppler, and power Doppler as needed of all accessible portions of each vessel. Bilateral testing is considered an integral part  of a complete examination. Limited examinations for reoccurring indications may be performed as noted.  Right Venous Findings: +---------+---------------+---------+-----------+--------------------+-------+          CompressibilityPhasicitySpontaneityProperties          Summary +---------+---------------+---------+-----------+--------------------+-------+ CFV      Full           Yes      Yes                                    +---------+---------------+---------+-----------+--------------------+-------+ SFJ      Full           Yes      Yes                                    +---------+---------------+---------+-----------+--------------------+-------+ FV Prox  Partial        No       Yes        spongy w/compressionAcute   +---------+---------------+---------+-----------+--------------------+-------+ FV Mid   Partial        No       Yes        spongy w/compressionAcute   +---------+---------------+---------+-----------+--------------------+-------+ FV DistalNone           No       No         softly echogenic    Acute   +---------+---------------+---------+-----------+--------------------+-------+ PFV      Full                                                            +---------+---------------+---------+-----------+--------------------+-------+ POP      None           No       No         softly echogenic            +---------+---------------+---------+-----------+--------------------+-------+ PTV      None           No       No         dilated             Acute   +---------+---------------+---------+-----------+--------------------+-------+ PERO     None           No       No         dilated             Acute   +---------+---------------+---------+-----------+--------------------+-------+ Gastroc  Full                                                           +---------+---------------+---------+-----------+--------------------+-------+ GSV      Full                                                           +---------+---------------+---------+-----------+--------------------+-------+  Left Venous Findings: +---------+---------------+---------+-----------+----------+-------+          CompressibilityPhasicitySpontaneityPropertiesSummary +---------+---------------+---------+-----------+----------+-------+ CFV      Full           Yes      Yes                          +---------+---------------+---------+-----------+----------+-------+ SFJ      Full           Yes      Yes                          +---------+---------------+---------+-----------+----------+-------+ FV Prox  Full           Yes      Yes                          +---------+---------------+---------+-----------+----------+-------+ FV Mid   Full           Yes      Yes                          +---------+---------------+---------+-----------+----------+-------+ FV DistalFull           Yes      Yes                          +---------+---------------+---------+-----------+----------+-------+ PFV      Full                                                 +---------+---------------+---------+-----------+----------+-------+ POP      Full            Yes      Yes                          +---------+---------------+---------+-----------+----------+-------+ PTV      Full           Yes      Yes                          +---------+---------------+---------+-----------+----------+-------+ PERO     Full           Yes      Yes                          +---------+---------------+---------+-----------+----------+-------+ Gastroc  Full                                                 +---------+---------------+---------+-----------+----------+-------+ GSV      Full           Yes      Yes                          +---------+---------------+---------+-----------+----------+-------+   Findings reported directly to Kerin Ransom at 3:15 pm.  Summary: Right: Findings consistent with acute deep vein thrombosis involving the right femoral vein, right popliteal vein, right posterior tibial vein, and right peroneal vein.  No cystic structure found in the popliteal fossa. Left: No evidence of deep vein thrombosis in the lower extremity. No indirect evidence of obstruction proximal to the inguinal ligament.  *See table(s) above for measurements and observations. Electronically signed by Ena Dawley MD on 05/24/2018 at 3:25:47 PM.    Final     Microbiology: No results found for this or any previous visit (from the past 240 hour(s)).   Labs: Basic Metabolic Panel: Recent Labs  Lab 05/24/18 1737 05/25/18 0309  NA 139 140  K 3.5 3.7  CL 105 109  CO2 22 22  GLUCOSE 116* 117*  BUN 21 19  CREATININE 1.19 1.09  CALCIUM 9.2 8.6*   Liver Function Tests: No results for input(s): AST, ALT, ALKPHOS, BILITOT, PROT, ALBUMIN in the last 168 hours. No results for input(s): LIPASE, AMYLASE in the last 168 hours. No results for input(s): AMMONIA in the last 168 hours. CBC: Recent Labs  Lab 05/24/18 1737 05/25/18 0309  WBC 17.6* 14.3*  NEUTROABS 14.2*  --   HGB 14.6 13.3  HCT 45.5 39.7  MCV 90.5 87.8  PLT 226 202   Cardiac  Enzymes: Recent Labs  Lab 05/24/18 2353 05/25/18 0309 05/25/18 1235  TROPONINI <0.03 <0.03 <0.03   BNP: BNP (last 3 results) No results for input(s): BNP in the last 8760 hours.  ProBNP (last 3 results) No results for input(s): PROBNP in the last 8760 hours.  CBG: No results for input(s): GLUCAP in the last 168 hours.  Principal Problem:   Pulmonary embolism ([place]) Active Problems:   CAD- CABG X 17 August 2012   PVD- multiple LE interventions   Bradycardia   PAF (paroxysmal atrial fibrillation) (HCC)   Obstructive chronic bronchitis without exacerbation ([place])   DVT of lower limb, acute ([place])   Time coordinating discharge: 35 minutes  Signed:  Murray Hodgkins, MD [facility] 05/25/2018, 9:20 PM

## 2018-05-25 NOTE — Progress Notes (Signed)
Nsg Discharge Note  Admit Date:  05/24/2018 Discharge date: 05/25/2018   Brian Brock to be D/C'd Home per MD order.  AVS completed.  Copy for chart, and copy for patient signed, and dated. Patient/caregiver able to verbalize understanding.  Discharge Medication: Allergies as of 05/25/2018      Reactions   Other Anaphylaxis, Shortness Of Breath, Swelling   [place] nuts- "will kill me"   Metoprolol Other (See Comments)   Fatigue and malaise   Tricor [fenofibrate] Other (See Comments)   Tired/weak   Clarithromycin Nausea Only   GI upset   Fenofibrate Micronized Other (See Comments)   Caused weakness/lethargy   Levofloxacin Other (See Comments)   UNSPECIFIED REACTION ??   Metaxalone Other (See Comments)   Unknown reaction ??   Niaspan [niacin]    UNSPECIFIED REACTION    Fish Oil Rash   Meloxicam Other (See Comments)   Achy, fatigue   Sulfadiazine Hives, Rash      Medication List    STOP taking these medications   clopidogrel 75 MG tablet Commonly known as:  PLAVIX   predniSONE 20 MG tablet Commonly known as:  DELTASONE     TAKE these medications   albuterol 108 (90 Base) MCG/ACT inhaler Commonly known as:  PROAIR HFA Inhale 2 puffs every 6 hours as needed What changed:    how much to take  how to take this  when to take this  reasons to take this  additional instructions   aspirin EC 81 MG tablet Take 81 mg by mouth daily.   azithromycin 250 MG tablet Commonly known as:  ZITHROMAX Take 250-500 mg by mouth See admin instructions. Take 500 mg by mouth on day one, then 250 mg once a day on days 2-5   Biotin 1000 MCG tablet Take 1,000 mcg by mouth daily.   BYSTOLIC 5 MG tablet Generic drug:  nebivolol TAKE 1/2 TABLET ONCE DAILY What changed:  how much to take   cetirizine 10 MG tablet Commonly known as:  ZYRTEC Take 10 mg by mouth daily.   cholestyramine 4 g packet Commonly known as:  QUESTRAN Take 0.5 packets (2 g total) by mouth 2 (two) times  daily with a meal.   cyclobenzaprine 10 MG tablet Commonly known as:  FLEXERIL Take 10 mg by mouth 3 (three) times daily as needed for muscle spasms.   fluconazole 150 MG tablet Commonly known as:  DIFLUCAN Take 1 tablet (150 mg total) by mouth 2 (two) times a week. Wednesday and Saturday Start taking on:  May 26, 2018   fluticasone 50 MCG/ACT nasal spray Commonly known as:  FLONASE Place 2 sprays into both nostrils daily as needed for allergies or rhinitis.   ipratropium 0.03 % nasal spray Commonly known as:  ATROVENT Place 1-2 sprays into both nostrils 2 (two) times daily as needed for rhinitis.   isosorbide mononitrate 30 MG 24 hr tablet Commonly known as:  IMDUR TAKE 1/2 TABLET BY MOUTH EVERY DAY   levothyroxine 25 MCG tablet Commonly known as:  SYNTHROID, LEVOTHROID Take 25 mcg by mouth daily before breakfast.   loperamide 2 MG tablet Commonly known as:  IMODIUM A-D Take 2 mg by mouth 4 (four) times daily as needed for diarrhea or loose stools.   losartan 50 MG tablet Commonly known as:  COZAAR Take 1 tablet (50 mg total) by mouth daily. Start taking on:  May 26, 2018 What changed:    medication strength  how much to take  nitroGLYCERIN 0.4 MG SL tablet Commonly known as:  NITROSTAT PLACE 1 TABLET UNDER THE TONGUE, MAY REPEAT EVERY 5 MINUTES MAX 3 TABLETS What changed:  See the new instructions.   pantoprazole 40 MG tablet Commonly known as:  PROTONIX Take 1 tablet (40 mg total) by mouth 2 (two) times daily. What changed:  when to take this   PROBIOTIC PO Take 1 capsule by mouth daily.   Rivaroxaban 15 & 20 MG Tbpk Take as directed on package: Start with one 15mg  tablet by mouth twice a day with food. On Day 22, switch to one 20mg  tablet once a day with food.   rosuvastatin 20 MG tablet Commonly known as:  CRESTOR TAKE ONE TABLET BY MOUTH EVERY DAY   triamcinolone cream 0.1 % Commonly known as:  KENALOG Apply 1 application topically as needed  (for itching).   Turmeric 500 MG Caps Take 1,000 mg by mouth daily.   zolpidem 10 MG tablet Commonly known as:  AMBIEN Take 5-10 mg by mouth at bedtime.       Discharge Assessment: Vitals:   05/25/18 1145 05/25/18 1230  BP:  (!) 156/88  Pulse: (!) 44 (!) 43  Resp: 17   Temp:    SpO2: 95% 97%   Skin clean, dry and intact without evidence of skin break down, no evidence of skin tears noted. IV catheter discontinued intact. Site without signs and symptoms of complications - no redness or edema noted at insertion site, patient denies c/o pain - only slight tenderness at site.  Dressing with slight pressure applied.  D/c Instructions-Education: Discharge instructions given to patient/family with verbalized understanding. D/c education completed with patient/family including follow up instructions, medication list, d/c activities limitations if indicated, with other d/c instructions as indicated by MD - patient able to verbalize understanding, all questions fully answered. Patient instructed to return to ED, call 911, or call MD for any changes in condition.  Patient escorted via [place], and D/C home via private auto.  Hiram Comber, RN 05/25/2018 4:22 PM

## 2018-05-25 NOTE — Progress Notes (Signed)
Brian Brock [id number] Admission Data: 05/25/2018 12:59 PM Attending Provider: Rise Patience, MD  [id number], Audree Camel, MD Consults/ Treatment Team:   Brian Brock is a 73 y.o. male patient admitted from ED awake, alert  & orientated  X 3,  Full Code, VSS - Blood pressure (!) 156/88, pulse (!) 43, temperature 97.8 F (36.6 C), temperature source Oral, resp. rate 17, SpO2 97 %., on RA, no c/o shortness of breath, no c/o chest pain, no distress noted. Tele # 22 placed and pt is currently running:sinus bradycardia.   IV site WDL:  antecubital left, condition patent and no redness with a transparent dsg that's clean dry and intact.  Allergies:   Allergies  Allergen Reactions  . Other Anaphylaxis, Shortness Of Breath and Swelling    [place] nuts- "will kill me"  . Metoprolol Other (See Comments)    Fatigue and malaise  . Tricor [Fenofibrate] Other (See Comments)    Tired/weak  . Clarithromycin Nausea Only    GI upset  . Fenofibrate Micronized Other (See Comments)    Caused weakness/lethargy  . Levofloxacin Other (See Comments)    UNSPECIFIED REACTION ??  Brian Brock Other (See Comments)    Unknown reaction ??  . Niaspan [Niacin]     UNSPECIFIED REACTION   . Fish Oil Rash  . Meloxicam Other (See Comments)    Achy, fatigue  . Sulfadiazine Hives and Rash     Past Medical History:  Diagnosis Date  . AF (atrial fibrillation) ([place])    happened after CABG, has not had it since  . Allergic rhinitis   . Allergy   . Anginal pain ([place]) 1997  . Arthritis    "lower back" (09/26/2015)  . Childhood asthma   . Chronic bronchitis ([place])    Yearly  . COPD (chronic obstructive pulmonary disease) ([place])   . Coronary artery disease    Multiple stents  . Dyslipidemia   . Dysrhythmia   . Femoral hernia of right side 12/28/2016  . GERD (gastroesophageal reflux disease)   . History of hiatal hernia   . Hyperlipemia   . Hypertension   . Hypothyroidism   . Irritable bowel  syndrome   . Myocardial infarct ([place]) 1997   "mild"  . Peripheral vascular disease ([place])    prior stenting  . Restless legs     Pt orientation to unit, room and routine. Information packet given to patient/family and safety video watched.  Admission INP armband ID verified with patient/family, and in place. SR up x 2, fall risk assessment complete with Patient and family verbalizing understanding of risks associated with falls. Pt verbalizes an understanding of how to use the call bell and to call for help before getting out of bed.  Skin, clean-dry- intact without evidence of bruising, or skin tears.   Will cont to monitor and assist as needed.  Hiram Comber, RN 05/25/2018 12:59 PM

## 2018-05-25 NOTE — Progress Notes (Signed)
[place] for heparin>>xarelto Indication: pulmonary embolus  Vital Signs: BP: 153/92 (01/08 0700) Pulse Rate: 37 (01/08 0600)  Labs: Recent Labs    05/24/18 1737 05/24/18 2353 05/25/18 0309  HGB 14.6  --  13.3  HCT 45.5  --  39.7  PLT 226  --  202  HEPARINUNFRC  --   --  0.97*  CREATININE 1.19  --  1.09  TROPONINI  --  <0.03 <0.03    Estimated Creatinine Clearance: 66.9 mL/min (by C-G formula based on SCr of 1.09 mg/dL).  Assessment: 70 yom presenting with R leg swelling and calf pain. Venous duplex showing acute DVT involving R femoral, popliteal, posterior tibial, and peroneal vein. CT chest small bilateral non-occlusive PE without R heart strain.   Initially started on IV heparin but now transitioning to oral xarelto. Pt with good renal fxn and CBC is WNL. No bleeding noted.    Goal of Therapy:  Heparin level 0.3-0.7 units/ml Monitor platelets by anticoagulation protocol: Yes   Plan:  DC IV heparin Start xarelto 15mg  PO BID x 21 days then 20mg  daily thereafter F/u renal function, S&S of bleeding *Pt educated and provided with coupon card  Salome Arnt, PharmD, BCPS Please see AMION for all pharmacy numbers 05/25/2018 9:35 AM

## 2018-05-25 NOTE — Progress Notes (Signed)
Echocardiogram 2D Echocardiogram has been performed.  05/25/2018 10:54 AM Maudry Mayhew, MHA, RVT, RDCS, RDMS

## 2018-05-25 NOTE — Progress Notes (Signed)
[facility] for heparin Indication: pulmonary embolus  Allergies  Allergen Reactions  . Other Anaphylaxis, Shortness Of Breath and Swelling    [place] nuts- "will kill me"  . Metoprolol Other (See Comments)    Fatigue and malaise  . Tricor [Fenofibrate] Other (See Comments)    Tired/weak  . Clarithromycin Nausea Only    GI upset  . Fenofibrate Micronized Other (See Comments)    Caused weakness/lethargy  . Levofloxacin Other (See Comments)    UNSPECIFIED REACTION ??  Janace Hoard Other (See Comments)    Unknown reaction ??  . Niaspan [Niacin]     UNSPECIFIED REACTION   . Fish Oil Rash  . Meloxicam Other (See Comments)    Achy, fatigue  . Sulfadiazine Hives and Rash    Patient Measurements:   Heparin Dosing Weight: 86.9 kg  Vital Signs: BP: 148/73 (01/08 0300) Pulse Rate: 46 (01/08 0300)  Labs: Recent Labs    05/24/18 1737 05/24/18 2353 05/25/18 0309  HGB 14.6  --  13.3  HCT 45.5  --  39.7  PLT 226  --  202  HEPARINUNFRC  --   --  0.97*  CREATININE 1.19  --   --   TROPONINI  --  <0.03  --     Estimated Creatinine Clearance: 61.3 mL/min (by C-G formula based on SCr of 1.19 mg/dL).     Medications:  Scheduled:  . aspirin EC  81 mg Oral Daily  . cholestyramine  2 g Oral BID WC  . clopidogrel  75 mg Oral Daily  . isosorbide mononitrate  15 mg Oral Daily  . levothyroxine  25 mcg Oral QAC breakfast  . loratadine  10 mg Oral Daily  . losartan  50 mg Oral Daily  . nebivolol  2.5 mg Oral Daily  . pantoprazole  40 mg Oral QHS  . predniSONE  20 mg Oral BID WC  . rosuvastatin  20 mg Oral Daily  . saccharomyces boulardii  250 mg Oral Daily  . zolpidem  5 mg Oral QHS    Assessment: 21 yom presenting with R leg swelling and calf pain. Venous duplex showing acute DVT involving R femoral, popliteal, posterior tibial, and peroneal vein. CT chest small bilateral non-occlusive PE without R heart strain.   Hgb 14.6, plt 226. No  s/sx of bleeding.  Initial heparin level 0.97 units/ml  Goal of Therapy:  Heparin level 0.3-0.7 units/ml Monitor platelets by anticoagulation protocol: Yes   Plan:  Decrease heparin drip to 1250 units/hr Check heparin level 6 hours after rate change Thanks for allowing pharmacy to be a part of this patient's care.  Excell Seltzer, PharmD Clinical Pharmacist  05/25/2018,4:14 AM

## 2018-05-26 ENCOUNTER — Telehealth: Payer: Self-pay | Admitting: Cardiology

## 2018-05-26 NOTE — Telephone Encounter (Signed)
Returned pt call. Pt was started on Xarelto on 05/24/18 dx DVT/PE. Pt sts that he has had 2 episodes of nose bleeds that he has been able to control the bleeding.  Instructed pt that if there is a reoccurrence he should not lean he has back, he should apply pressure for 15 min, he could try to pack the nostril. Adv him to keep his nasal passages moist.  Pt would like to know if there is something he could use as needed to prevent reoccurrence. He has been given to new prescriptions for nasal sprays, Flonase and Atrovent prn. He would like some clarification on how to use them. I told him that I believe they would increase the dry our nasal passages. Adv pt that I will fwd a message to our Pharmacist for clarification and recommendation and call back with their response.

## 2018-05-26 NOTE — Telephone Encounter (Signed)
Pt aware of Pharm-D recommendations with verbal understanding and will f/u with his pcp as planned. Pt voiced appreciation for the assistance.

## 2018-05-26 NOTE — Telephone Encounter (Signed)
Agree with current recommendation. Patient needs to avoid irritation to nasal passages to decrease nosebleeds.  Recommendation to keep nostril moist, use humidifier is possible and/or nasal saline solution for congestion id needed.   Afrin nasal spray helps stop nasal bleedings but will not prevent them.   Noted patient was instructed to f/u with PCP in 1 week after hospital discharge. Please discuss any further nosebleeds episodes with PCP during visit as well.

## 2018-05-26 NOTE — Telephone Encounter (Signed)
New Message   Pt c/o medication issue:  1. Name of Medication: Xareto  2. How are you currently taking this medication (dosage and times per day)? Pt not sure, was given medication Wednesday at [facility]   3. Are you having a reaction (difficulty breathing--STAT)? No  4. What is your medication issue? Pt is now having nose bleeds

## 2018-05-27 NOTE — ED Provider Notes (Signed)
[facility] 5W PROGRESSIVE CARE Provider Note   CSN: [id number] Arrival date & time: 05/24/18  1601     History   Chief Complaint Chief Complaint  Patient presents with  . DVT  . Possible PE    HPI Brian Brock is a 73 y.o. male.  HPI Patient presents to the emergency department with shortness of breath and leg pain over the last 2 weeks.  The patient was found to have a DVT on ultrasound today.  Patient was sent here for further evaluation and possible pulmonary embolus.  Patient states that nothing seems to make the condition better or worse.  The patient states that he did not take any medications prior to arrival for symptoms.  The patient denies chest pain,  headache,blurred vision, neck pain, fever, cough, weakness, numbness, dizziness, anorexia, edema, abdominal pain, nausea, vomiting, diarrhea, rash, back pain, dysuria, hematemesis, bloody stool, near syncope, or syncope. Past Medical History:  Diagnosis Date  . AF (atrial fibrillation) ([place])    happened after CABG, has not had it since  . Allergic rhinitis   . Allergy   . Anginal pain ([place]) 1997  . Arthritis    "lower back" (09/26/2015)  . Childhood asthma   . Chronic bronchitis ([place])    Yearly  . COPD (chronic obstructive pulmonary disease) ([place])   . Coronary artery disease    Multiple stents  . Dyslipidemia   . Dysrhythmia   . Femoral hernia of right side 12/28/2016  . GERD (gastroesophageal reflux disease)   . History of hiatal hernia   . Hyperlipemia   . Hypertension   . Hypothyroidism   . Irritable bowel syndrome   . Myocardial infarct ([place]) 1997   "mild"  . PE (pulmonary thromboembolism) ([place]) 05/2018  . Peripheral vascular disease ([place])    prior stenting  . Restless legs     Patient Active Problem List   Diagnosis Date Noted  . Leg edema, right 05/24/2018  . COPD (chronic obstructive pulmonary disease) ([place]) 05/24/2018  . Pulmonary embolism ([place]) 05/24/2018  . DVT of lower limb, acute ([place])  05/24/2018  . Femoral hernia of right side 12/28/2016  . Obstructive chronic bronchitis without exacerbation ([place]) 03/09/2015  . Hyperlipidemia LDL goal <70 02/15/2014  . Palpitations 02/15/2014  . Metabolic syndrome 53/66/4403  . Essential hypertension 04/15/2013  . Bradycardia 11/04/2012  . Dizziness 11/04/2012  . PAF (paroxysmal atrial fibrillation) ([place]) 11/04/2012  . Elevated LFTs 09/06/2012  . PVD- multiple LE interventions 09/06/2012  . CAD- CABG X 17 August 2012 09/05/2012  . DYSPNEA 06/26/2009  . Tobacco use 05/23/2009    Past Surgical History:  Procedure Laterality Date  . CARDIAC CATHETERIZATION  2004   had remote tandem stents in 1997 and 1998 for LAD lesions, RCA stent in 1997 , midprto=ion, and a proximal RCA  SENT IN 1999, all DES; some were IVIS  guide.  [name] CARDIAC CATHETERIZATION  09/07/2012   demonstrated severe 2- vessel disease,patent LAD,patient circ and patent right stents, but 90% ostial LAD whic was new.  . CAROTID DOPPLER  100/24/11   RGT BULB and PROXIMAL ICA 0-49%; LFT. ICA  0-49 ; RGT SUBCLAVIAN ARTERY < 50% ; LFT SUBCLAVIAN normal patency; RGT  and LFT  VERTEBRAL ARTERIES  . COLONOSCOPY  11/06/2016   Diverticulosis of colon. Internal hemorrhoids.   . CORONARY ANGIOPLASTY    . CORONARY ANGIOPLASTY WITH STENT PLACEMENT  '97, '98, '04, '06, '08, '10   seven cardiac stents  . CORONARY ARTERY  BYPASS GRAFT N/A 09/12/2012   Procedure: OFF PUMP CORONARY ARTERY BYPASS GRAFTING (CABG);  Surgeon: Melrose Nakayama, MD;  Location: [facility];  Service: Open Heart Surgery;  Laterality: N/A;  Times two using left internal mammary artery and endoscopically harvested right saphenous vein  ;LIMA-LAD and SVG -OM1  . DOPPLER ECHOCARDIOGRAPHY  11/23/2012,09/30/2010    LV EF 50-55%----09/30/2010-LV EF = >55%  . ENDARTERECTOMY FEMORAL Right 12/19/2015   Procedure: ENDARTERECTOMY RIGHT FEMORAL ARTERY;  Surgeon: Serafina Mitchell, MD;  Location: [facility];  Service: Vascular;  Laterality:  Right;  . event monitor  11/04/2012-11/17/2012   pt denies this hx on 07/24/2014  . FEMORAL ARTERY STENT Bilateral 7/06  8/08   Lt SFA '06, Rt SFA 8/08  . HERNIA REPAIR  10/2008   "repaired w/gallbladder OR"  . HERNIA REPAIR  07/2016  . INGUINAL HERNIA REPAIR Right 12/28/2016   Procedure: OPEN REPAIR RIGHT GROIN HERNIA WITH MESH;  Surgeon: Fanny Skates, MD;  Location: [facility];  Service: General;  Laterality: Right;  . LAPAROSCOPIC CHOLECYSTECTOMY  June 2010  . LEFT HEART CATHETERIZATION WITH CORONARY ANGIOGRAM N/A 09/07/2012   Procedure: LEFT HEART CATHETERIZATION WITH CORONARY ANGIOGRAM;  Surgeon: Troy Sine, MD;  Location[facility] C[facility];  Service: Cardiovascular;  Laterality: N/A;  . LEFT HEART CATHETERIZATION WITH CORONARY/GRAFT ANGIOGRAM N/A 07/24/2014   Procedure: LEFT HEART CATHETERIZATION WITH Beatrix Fetters;  Surgeon: Troy Sine, MD; LAD 70%, 50% ISR, mLAD 99%, CFX 30%, patent stent, RCA patent, LIMA-LAD occluded, SVG-OM 1 patent, EF 50-55%  . LEXISCAN STRESS TEST  09/06/2012  . LOWER ARTERIAL DOPPLER  03/25/2012   FGT CIA 0-49%; RGT CFA AND SFA AT STENT 0-49%; RGT DISTAL SFA = <50%; LFT. EIA  50-69%; LFT. CFA 0-49% ;;LFT. SFA  AT STENT NORMAL PATENCY  . NM MYOVIEW LTD  05/06/2012   EF -STUDY NOT GATED, NORMAL STRESS NUCLEAR ,NORMAL LV FUNCTION  . PATCH ANGIOPLASTY Right 12/19/2015   Procedure: PATCH ANGIOPLASTY RIGHT FEMORAL ARTERY;  Surgeon: Serafina Mitchell, MD;  Location: [facility];  Service: Vascular;  Laterality: Right;  . PERCUTANEOUS CORONARY STENT INTERVENTION (PCI-S) Right 07/24/2014   Procedure: PERCUTANEOUS CORONARY STENT INTERVENTION (PCI-S);  Surgeon: Troy Sine, MD; ostial LAD PTCA with angioscope scoring balloon, 3.0 x 34 mm Resolute DES extending from the ostium to the mid LAD, covering all significant stenoses   . PERIPHERAL VASCULAR CATHETERIZATION N/A 09/26/2015   Procedure: Abdominal Aortogram w/Lower Extremity;  Surgeon: Lorretta Harp, MD;  Location: [facility] CV LAB;  Service: Cardiovascular;  Laterality: N/A;  . PERIPHERAL VASCULAR CATHETERIZATION Right 02/20/2016   Procedure: Peripheral Vascular Atherectomy;  Surgeon: Lorretta Harp, MD;  Location: [facility] CV LAB;  Service: Cardiovascular;  Laterality: Right;  SFA  . PERIPHERAL VASCULAR CATHETERIZATION Right 02/20/2016   Procedure: Peripheral Vascular Balloon Angioplasty;  Surgeon: Lorretta Harp, MD;  Location: [facility] CV LAB;  Service: Cardiovascular;  Laterality: Right;  SFA  . TONSILLECTOMY  1950's  . UPPER GASTROINTESTINAL ENDOSCOPY          Home Medications    Prior to Admission medications   Medication Sig Start Date End Date Taking? Authorizing Provider  albuterol (PROAIR HFA) 108 (90 Base) MCG/ACT inhaler Inhale 2 puffs every 6 hours as needed Patient taking differently: Inhale 2 puffs into the lungs every 6 (six) hours as needed for wheezing or shortness of breath.  09/10/17  Yes Baird Lyons D, MD  aspirin EC 81 MG tablet Take 81 mg  by mouth daily.    Yes [provider]  azithromycin (ZITHROMAX) 250 MG tablet Take 250-500 mg by mouth See admin instructions. Take 500 mg by mouth on day one, then 250 mg once a day on days 2-5   Yes [provider]  Biotin 1000 MCG tablet Take 1,000 mcg by mouth daily.   Yes [provider]  BYSTOLIC 5 MG tablet TAKE 1/2 TABLET ONCE DAILY Patient taking differently: Take 2.5 mg by mouth daily.  11/04/16  Yes Troy Sine, MD  cetirizine (ZYRTEC) 10 MG tablet Take 10 mg by mouth daily.    Yes [provider]  cyclobenzaprine (FLEXERIL) 10 MG tablet Take 10 mg by mouth 3 (three) times daily as needed for muscle spasms.  02/16/18  Yes [provider]  fluticasone (FLONASE) 50 MCG/ACT nasal spray Place 2 sprays into both nostrils daily as needed for allergies or rhinitis.  04/29/18  Yes [provider]  ipratropium (ATROVENT) 0.03 % nasal spray Place 1-2 sprays into both nostrils 2  (two) times daily as needed for rhinitis.    Yes [provider]  isosorbide mononitrate (IMDUR) 30 MG 24 hr tablet TAKE 1/2 TABLET BY MOUTH EVERY DAY Patient taking differently: Take 15 mg by mouth daily.  03/17/17  Yes Lorretta Harp, MD  levothyroxine (SYNTHROID, LEVOTHROID) 25 MCG tablet Take 25 mcg by mouth daily before breakfast.  07/31/14  Yes [provider]  loperamide (IMODIUM A-D) 2 MG tablet Take 2 mg by mouth 4 (four) times daily as needed for diarrhea or loose stools.   Yes [provider]  nitroGLYCERIN (NITROSTAT) 0.4 MG SL tablet PLACE 1 TABLET UNDER THE TONGUE, MAY REPEAT EVERY 5 MINUTES MAX 3 TABLETS Patient taking differently: Place 0.4 mg under the tongue every 5 (five) minutes as needed for chest pain.  02/03/18  Yes Troy Sine, MD  pantoprazole (PROTONIX) 40 MG tablet Take 1 tablet (40 mg total) by mouth 2 (two) times daily. Patient taking differently: Take 40 mg by mouth at bedtime.  11/03/17  Yes Esterwood, Amy S, PA-C  Probiotic Product (PROBIOTIC PO) Take 1 capsule by mouth daily.    Yes [provider]  rosuvastatin (CRESTOR) 20 MG tablet TAKE ONE TABLET BY MOUTH EVERY DAY Patient taking differently: Take 20 mg by mouth daily.  11/17/16  Yes Troy Sine, MD  triamcinolone cream (KENALOG) 0.1 % Apply 1 application topically as needed (for itching).    Yes [provider]  Turmeric 500 MG CAPS Take 1,000 mg by mouth daily.   Yes [provider]  zolpidem (AMBIEN) 10 MG tablet Take 5-10 mg by mouth at bedtime.  07/05/17  Yes [provider]  cholestyramine (QUESTRAN) 4 g packet Take 0.5 packets (2 g total) by mouth 2 (two) times daily with a meal. 05/25/18   Norval Morton, MD  fluconazole (DIFLUCAN) 150 MG tablet Take 1 tablet (150 mg total) by mouth 2 (two) times a week. Wednesday and Saturday 05/26/18   Norval Morton, MD  losartan (COZAAR) 50 MG tablet Take 1 tablet (50 mg total) by mouth daily. 05/26/18    Norval Morton, MD  Rivaroxaban 15 & 20 MG TBPK Take as directed on package: Start with one 15mg  tablet by mouth twice a day with food. On Day 22, switch to one 20mg  tablet once a day with food. 05/25/18   Norval Morton, MD    Family History Family History  Problem Relation  Age of Onset  . Coronary artery disease Father 60  . Heart attack Father   . Coronary artery disease Brother 79       CABG in 2000  . Colon cancer Neg Hx   . Esophageal cancer Neg Hx   . Rectal cancer Neg Hx   . Stomach cancer Neg Hx     Social History Social History   Tobacco Use  . Smoking status: Current Every Day Smoker    Packs/day: 0.50    Years: 56.00    Pack years: 28.00    Types: Cigarettes    Last attempt to quit: 03/25/2016    Years since quitting: 2.1  . Smokeless tobacco: Former Systems developer    Types: Chew  Substance Use Topics  . Alcohol use: Yes    Alcohol/week: 1.0 standard drinks    Types: 1 Standard drinks or equivalent per week    Comment: 09/26/2015 "varies; might have a few drinks q 2 months"  . Drug use: No     Allergies   Other; Metoprolol; Tricor [fenofibrate]; Clarithromycin; Fenofibrate micronized; Levofloxacin; Metaxalone; Niaspan [niacin]; Fish oil; Meloxicam; and Sulfadiazine   Review of Systems Review of Systems All other systems negative except as documented in the HPI. All pertinent positives and negatives as reviewed in the HPI.  Physical Exam Updated Vital Signs BP (!) 156/88   Pulse (!) 43   Temp 97.8 F (36.6 C) (Oral)   Resp 17   SpO2 97%   Physical Exam Vitals signs and nursing note reviewed.  Constitutional:      General: He is not in acute distress.    Appearance: He is well-developed.  HENT:     Head: Normocephalic and atraumatic.  Eyes:     Pupils: Pupils are equal, round, and reactive to light.  Neck:     Musculoskeletal: Normal range of motion and neck supple.  Cardiovascular:     Rate and Rhythm: Normal rate and regular rhythm.     Heart  sounds: Normal heart sounds. No murmur. No friction rub. No gallop.   Pulmonary:     Effort: Pulmonary effort is normal. No respiratory distress.     Breath sounds: Normal breath sounds. No wheezing.  Abdominal:     General: Bowel sounds are normal. There is no distension.     Palpations: Abdomen is soft.     Tenderness: There is no abdominal tenderness.  Skin:    General: Skin is warm and dry.     Capillary Refill: Capillary refill takes less than 2 seconds.     Findings: No erythema or rash.  Neurological:     Mental Status: He is alert and oriented to person, place, and time.     Motor: No abnormal muscle tone.     Coordination: Coordination normal.  Psychiatric:        Behavior: Behavior normal.      ED Treatments / Results  Labs (all labs ordered are listed, but only abnormal results are displayed) Labs Reviewed  BASIC METABOLIC PANEL - Abnormal; Notable for the following components:      Result Value   Glucose, Bld 116 (*)    All other components within normal limits  CBC WITH DIFFERENTIAL/PLATELET - Abnormal; Notable for the following components:   WBC 17.6 (*)    Neutro Abs 14.2 (*)    Abs Immature Granulocytes 0.22 (*)    All other components within normal limits  HEPARIN LEVEL (UNFRACTIONATED) - Abnormal; Notable for the following components:  Heparin Unfractionated 0.97 (*)    All other components within normal limits  CBC - Abnormal; Notable for the following components:   WBC 14.3 (*)    All other components within normal limits  BASIC METABOLIC PANEL - Abnormal; Notable for the following components:   Glucose, Bld 117 (*)    Calcium 8.6 (*)    All other components within normal limits  TROPONIN I  TROPONIN I  TROPONIN I    EKG None  Radiology No results found.  Procedures Procedures (including critical care time)  Medications Ordered in ED Medications  iopamidol (ISOVUE-370) 76 % injection (100 mLs  Contrast Given 05/24/18 1833)  heparin  bolus via infusion 4,400 Units (4,400 Units Intravenous Bolus from Bag 05/24/18 1944)     Initial Impression / Assessment and Plan / ED Course  I have reviewed the triage vital signs and the nursing notes.  Pertinent labs & imaging results that were available during my care of the patient were reviewed by me and considered in my medical decision making (see chart for details).   The patient was started on heparin for bilateral pulmonary embolus.  Patient is no acute distress at this time.  We will have him admitted to the hospital by the [facility] hospitalist service.  Patient agrees to this plan and all questions were answered.  Patient is remained stable here in the emergency department.    CRITICAL CARE Performed by: Resa Miner Hadriel Northup Total critical care time: *30 minutes Critical care time was exclusive of separately billable procedures and treating other patients. Critical care was necessary to treat or prevent imminent or life-threatening deterioration. Critical care was time spent personally by me on the following activities: development of treatment plan with patient and/or surrogate as well as nursing, discussions with consultants, evaluation of patient's response to treatment, examination of patient, obtaining history from patient or surrogate, ordering and performing treatments and interventions, ordering and review of laboratory studies, ordering and review of radiographic studies, pulse oximetry and re-evaluation of patient's condition.  Final Clinical Impressions(s) / ED Diagnoses   Final diagnoses:  PE (pulmonary thromboembolism) [facility])    ED Discharge Orders         Ordered    losartan (COZAAR) 50 MG tablet  Daily     05/25/18 1618    fluconazole (DIFLUCAN) 150 MG tablet  2 times weekly     05/25/18 1618    cholestyramine (QUESTRAN) 4 g packet  2 times daily with meals     05/25/18 1618    Increase activity slowly     05/25/18 1618    Diet - low sodium heart healthy       05/25/18 1618    Discharge instructions    Comments:  Please call and set up follow-up appointment with your primary care provider within 1 week. Due to the blood clot that was found we have started you on Xarelto.   Please be advised to stop taking Plavix, but continue with a daily aspirin.   05/25/18 1618    Rivaroxaban 15 & 20 MG TBPK     05/25/18 1618           Dalia Heading, PA-C 05/27/18 0008    Carmin Muskrat, MD 05/27/18 1921

## 2018-06-09 ENCOUNTER — Encounter: Payer: Self-pay | Admitting: Cardiology

## 2018-06-09 ENCOUNTER — Ambulatory Visit (INDEPENDENT_AMBULATORY_CARE_PROVIDER_SITE_OTHER): Payer: Medicare Other | Admitting: Cardiology

## 2018-06-09 DIAGNOSIS — I739 Peripheral vascular disease, unspecified: Secondary | ICD-10-CM

## 2018-06-09 DIAGNOSIS — I2510 Atherosclerotic heart disease of native coronary artery without angina pectoris: Secondary | ICD-10-CM

## 2018-06-09 DIAGNOSIS — I2699 Other pulmonary embolism without acute cor pulmonale: Secondary | ICD-10-CM

## 2018-06-09 NOTE — Progress Notes (Signed)
06/09/2018 Brian Brock   September 10, 1945  [id number]  Primary Physician [name], MD Primary Cardiologist: Dr Claiborne Billings  HPI: The patient is a pleasant 73 year old male followed by Dr. Claiborne Billings.  He had coronary artery bypass grafting in 2014.  In 2016 he had a PCI with DES to his native LAD.  Myoview in 2018 was low risk.  He has had prior vascular procedures, his last vascular procedure was an SFA PTA in September 2017.  He was seen in the office 05/24/2018.  He had complained of some swelling in his right calf and vague shortness of breath.  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  Because of his shortness of breath he was sent to the emergency room for a CT scan.  This did reveal bilateral small PE.  Echo showed an EF of 50% with normal RVF. He was treated with heparin and then transition to Xarelto.  He is in the office today for follow-up.  He is done well since discharge.  He is concerned about the cost of Xarelto.  He also tells me that his brother who is 109 years old told him that he is recently been diagnosed with blood clots in his legs as well.   Current Outpatient Medications  Medication Sig Dispense Refill  . albuterol (PROAIR HFA) 108 (90 Base) MCG/ACT inhaler Inhale 2 puffs every 6 hours as needed (Patient taking differently: Inhale 2 puffs into the lungs every 6 (six) hours as needed for wheezing or shortness of breath. ) 1 Inhaler 12  . aspirin EC 81 MG tablet Take 81 mg by mouth daily.     . Biotin 1000 MCG tablet Take 1,000 mcg by mouth daily.    [name] BYSTOLIC 5 MG tablet TAKE 1/2 TABLET ONCE DAILY (Patient taking differently: Take 2.5 mg by mouth daily. ) 30 tablet 6  . cetirizine (ZYRTEC) 10 MG tablet Take 10 mg by mouth daily.     . cholestyramine (QUESTRAN) 4 g packet Take 0.5 packets (2 g total) by mouth 2 (two) times daily with a meal. 60 each 12  . cyclobenzaprine (FLEXERIL) 10 MG tablet Take 10 mg by mouth 3 (three) times daily as needed for muscle  spasms.     [name] doxycycline (VIBRA-TABS) 100 MG tablet Take 1 tablet by mouth 2 (two) times daily.    . fluconazole (DIFLUCAN) 150 MG tablet Take 1 tablet (150 mg total) by mouth 2 (two) times a week. Wednesday and Saturday 8 tablet 0  . fluticasone (FLONASE) 50 MCG/ACT nasal spray Place 2 sprays into both nostrils daily as needed for allergies or rhinitis.     [name] ipratropium (ATROVENT) 0.03 % nasal spray Place 1-2 sprays into both nostrils 2 (two) times daily as needed for rhinitis.     [name] isosorbide mononitrate (IMDUR) 30 MG 24 hr tablet TAKE 1/2 TABLET BY MOUTH EVERY DAY (Patient taking differently: Take 15 mg by mouth daily. ) 45 tablet 2  . levothyroxine (SYNTHROID, LEVOTHROID) 25 MCG tablet Take 25 mcg by mouth daily before breakfast.     . loperamide (IMODIUM A-D) 2 MG tablet Take 2 mg by mouth 4 (four) times daily as needed for diarrhea or loose stools.    [name] losartan (COZAAR) 50 MG tablet Take 1 tablet (50 mg total) by mouth daily. 30 tablet 0  . nitroGLYCERIN (NITROSTAT) 0.4 MG SL tablet PLACE 1 TABLET UNDER THE TONGUE, MAY REPEAT EVERY 5 MINUTES MAX 3 TABLETS (Patient taking differently:  Place 0.4 mg under the tongue every 5 (five) minutes as needed for chest pain. ) 25 tablet 11  . pantoprazole (PROTONIX) 40 MG tablet Take 1 tablet (40 mg total) by mouth 2 (two) times daily. (Patient taking differently: Take 40 mg by mouth at bedtime. ) 60 tablet 0  . Probiotic Product (PROBIOTIC PO) Take 1 capsule by mouth daily.     . Rivaroxaban 15 & 20 MG TBPK Take as directed on package: Start with one 15mg  tablet by mouth twice a day with food. On Day 22, switch to one 20mg  tablet once a day with food. 51 each 0  . rosuvastatin (CRESTOR) 20 MG tablet TAKE ONE TABLET BY MOUTH EVERY DAY (Patient taking differently: Take 20 mg by mouth daily. ) 90 tablet 3  . triamcinolone cream (KENALOG) 0.1 % Apply 1 application topically as needed (for itching).     . zolpidem (AMBIEN) 10 MG tablet Take 5-10 mg by mouth  at bedtime.      No current facility-administered medications for this visit.     Allergies  Allergen Reactions  . Other Anaphylaxis, Shortness Of Breath and Swelling    [place] nuts- "will kill me"  . Metoprolol Other (See Comments)    Fatigue and malaise  . Tricor [Fenofibrate] Other (See Comments)    Tired/weak  . Clarithromycin Nausea Only    GI upset  . Fenofibrate Micronized Other (See Comments)    Caused weakness/lethargy  . Levofloxacin Other (See Comments)    UNSPECIFIED REACTION ??  Janace Hoard Other (See Comments)    Unknown reaction ??  . Niaspan [Niacin]     UNSPECIFIED REACTION   . Fish Oil Rash  . Meloxicam Other (See Comments)    Achy, fatigue  . Sulfadiazine Hives and Rash    Past Medical History:  Diagnosis Date  . AF (atrial fibrillation) ([place])    happened after CABG, has not had it since  . Allergic rhinitis   . Allergy   . Anginal pain ([place]) 1997  . Arthritis    "lower back" (09/26/2015)  . Childhood asthma   . Chronic bronchitis ([place])    Yearly  . COPD (chronic obstructive pulmonary disease) ([place])   . Coronary artery disease    Multiple stents  . Dyslipidemia   . Dysrhythmia   . Femoral hernia of right side 12/28/2016  . GERD (gastroesophageal reflux disease)   . History of hiatal hernia   . Hyperlipemia   . Hypertension   . Hypothyroidism   . Irritable bowel syndrome   . Myocardial infarct ([place]) 1997   "mild"  . PE (pulmonary thromboembolism) ([place]) 05/2018  . Peripheral vascular disease ([place])    prior stenting  . Restless legs     Social History   Socioeconomic History  . Marital status: Married    Spouse name: Not on file  . Number of children: Not on file  . Years of education: Not on file  . Highest education level: Not on file  Occupational History  . Not on file  Social Needs  . Financial resource strain: Not on file  . Food insecurity:    Worry: Not on file    Inability: Not on file  . Transportation needs:     Medical: Not on file    Non-medical: Not on file  Tobacco Use  . Smoking status: Current Every Day Smoker    Packs/day: 0.50    Years: 56.00    Pack years: 28.00  Types: Cigarettes    Last attempt to quit: 03/25/2016    Years since quitting: 2.2  . Smokeless tobacco: Former Systems developer    Types: Chew  Substance and Sexual Activity  . Alcohol use: Yes    Alcohol/week: 1.0 standard drinks    Types: 1 Standard drinks or equivalent per week    Comment: 09/26/2015 "varies; might have a few drinks q 2 months"  . Drug use: No  . Sexual activity: Not Currently  Lifestyle  . Physical activity:    Days per week: Not on file    Minutes per session: Not on file  . Stress: Not on file  Relationships  . Social connections:    Talks on phone: Not on file    Gets together: Not on file    Attends religious service: Not on file    Active member of club or organization: Not on file    Attends meetings of clubs or organizations: Not on file    Relationship status: Not on file  . Intimate partner violence:    Fear of current or ex partner: Not on file    Emotionally abused: Not on file    Physically abused: Not on file    Forced sexual activity: Not on file  Other Topics Concern  . Not on file  Social History Narrative  . Not on file     Family History  Problem Relation Age of Onset  . Coronary artery disease Father 12  . Heart attack Father   . Coronary artery disease Brother 33       CABG in 2000  . Deep vein thrombosis Brother   . Colon cancer Neg Hx   . Esophageal cancer Neg Hx   . Rectal cancer Neg Hx   . Stomach cancer Neg Hx      Review of Systems: General: negative for chills, fever, night sweats or weight changes.  Cardiovascular: negative for chest pain, dyspnea on exertion, edema, orthopnea, palpitations, paroxysmal nocturnal dyspnea or shortness of breath Dermatological: negative for rash Respiratory: negative for cough or wheezing Urologic: negative for  hematuria Abdominal: negative for nausea, vomiting, diarrhea, bright red blood per rectum, melena, or hematemesis Neurologic: negative for visual changes, syncope, or dizziness All other systems reviewed and are otherwise negative except as noted above.    Blood pressure 130/70, pulse (!) 50, height 5\' 9"  (1.753 m), weight 191 lb (86.6 kg).  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  I will refer him to Hematology for further evaluation of unprovoked DVT/PE and a Fm Hx of DVTs.  F/U Dr Claiborne Billings in 3 months.   Kerin Ransom PA-C 06/09/2018 11:13 AM

## 2018-06-09 NOTE — Assessment & Plan Note (Signed)
Last PTA was to Rt SFA sept 2017 Dopplers April 2019-bilateral ABI within normal range

## 2018-06-09 NOTE — Assessment & Plan Note (Signed)
Off pump LIMA-LAD and SVG-OM LAD PCI with DES March 2016 Myoview low risk 2018

## 2018-06-09 NOTE — Patient Instructions (Signed)
Medication Instructions:  Your physician recommends that you continue on your current medications as directed. Please refer to the Current Medication list given to you today. If you need a refill on your cardiac medications before your next appointment, please call your pharmacy.   Lab work: None  If you have labs (blood work) drawn today and your tests are completely normal, you will receive your results only by: [name] MyChart Message (if you have MyChart) OR . A paper copy in the mail If you have any lab test that is abnormal or we need to change your treatment, we will call you to review the results.  Testing/Procedures: None   Follow-Up: At [facility], you and your health needs are our priority.  As part of our continuing mission to provide you with exceptional heart care, we have created designated Provider Care Teams.  These Care Teams include your primary Cardiologist (physician) and Advanced Practice Providers (APPs -  Physician Assistants and Nurse Practitioners) who all work together to provide you with the care you need, when you need it.  . Your physician recommends that you schedule a follow-up appointment in: 3 months with Dr Claiborne Billings ONLY.  Any Other Special Instructions Will Be Listed Below (If Applicable). You have been referred to a Hematologist. Patient assistance form for Xarelto.

## 2018-06-09 NOTE — Assessment & Plan Note (Signed)
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

## 2018-06-13 ENCOUNTER — Telehealth: Payer: Self-pay | Admitting: Hematology

## 2018-06-13 NOTE — Telephone Encounter (Signed)
Spoke with patient to confirm new patient appt 07/05/18 at 0830. appt letter mailed 1/27

## 2018-06-24 ENCOUNTER — Telehealth: Payer: Self-pay | Admitting: Cardiovascular Disease

## 2018-06-24 NOTE — Telephone Encounter (Signed)
Patient aware samples are at the front desk for pick up  

## 2018-06-24 NOTE — Telephone Encounter (Signed)
New message   Patient calling the office for samples of medication:   1.  What medication and dosage are you requesting samples for? xarelto  2.  Are you currently out of this medication? No

## 2018-07-03 ENCOUNTER — Other Ambulatory Visit: Payer: Self-pay | Admitting: Hematology

## 2018-07-03 DIAGNOSIS — I824Y1 Acute embolism and thrombosis of unspecified deep veins of right proximal lower extremity: Secondary | ICD-10-CM

## 2018-07-03 DIAGNOSIS — I2699 Other pulmonary embolism without acute cor pulmonale: Secondary | ICD-10-CM

## 2018-07-03 NOTE — Progress Notes (Signed)
[facility] NOTE  Patient Care Team: [name], MD as PCP - General (Family Medicine) Troy Sine, MD as PCP - Cardiology (Cardiology) Lorretta Harp, MD as Consulting Physician (Cardiology) Troy Sine, MD as Consulting Physician (Cardiology) Deneise Lever, MD as Consulting Physician (Pulmonary Disease)  HEME/ONC OVERVIEW: 1. Unprovoked RLE DVT and bilateral PTE  -05/2018: doppler showed extensive acute DVT involving the R femoral, popliteal, posterior tibial and peroneal vein; CTA showed small bilateral PTE without evidence of RV strain -05/2018 - present: Xarelto  TREATMENT REGIMEN:  05/24/2018 - present: Xarelto  ASSESSMENT & PLAN:   Unprovoked RLE DVT and bilateral PTE  -I reviewed the patient's records in detail, including recent hospitalization records, lab studies and imaging results -I also independently reviewed the radiologic images of CTA chest, and agree with findings as documented -In summary, patient presented to cardiology office for symptoms of RLE swelling and vague dyspnea, and doppler of the LE showed extensive acute DVT involving the RLE. He was sent to the ER, where CTA chest showed small bilateral, non-occlusive PTE without RV strain. He was treated with heparin gtt and transitioned to Xarelto prior to discharge. -I reviewed with the patient about the plan for care for DVT and PTE. -This last episode of blood clot appeared to be unprovoked. We discussed about the pros and cons about testing for thrombophilia disorder. His current anticoagulation therapy will interfere with some the tests and it is not possible to interpret the test results. Taking him off the anticoagulation therapy to do the tests may precipitate another thrombotic event. I do not see a reason to order additional testing to screen for thrombophilia disorder as it would not change our management. In the absence of major contraindications, the goal of  anticoagulation therapy is lifelong.  -Patient is currently tolerating Xarelto without significant side effects -Given the unprovoked VTE, I recommend continuing Xarelto for at least 6 months, and if no evidence of recurrence, then we can consider reducing dose by half for secondary prophylaxis -Patient reports high co-pay for his prescription, and I have referred the patient to financial counselor for any assistance programs that he may qualify for  -While not routinely recommended, given the extensive unprovoked DVT/PTE and the patient's age, I have ordered CT abdomen/pelvis w/ contrast to rule out any intra-abdominal malignancies (see additional discussion below) -I recommend the patient to use elastic compression stockings at 20-30 mmHg to reduce risks of chronic thrombophlebitis. -Finally, I reinforced the importance of preventive strategies such as avoiding hormonal supplement, avoiding cigarette smoking, keeping up-to-date with screening programs for early cancer detection, frequent ambulation for long distance travel and aggressive DVT prophylaxis in all surgical settings. -Should he need any interruption of the anticoagulation for elective procedures in the future, feel free to contact me regarding peri-operative management.  Intermittent periumbilical discomfort -Patient reports episodic periumbilical discomfort -He also has hx of GERD; EGD in 10/2017 shwoed a small hital hernia but was otherwise normal -I have ordered CT abdomen/pelvis w/ contrast to rule out any intra-abdominal abnormalities, especially given recent extensive DVT, to rule out any malignancies   Tobacco cessation -I spent some time counseling the patient the importance of tobacco cessation. -We discussed common strategies including nicotine patches, Tobacco Quit-line, and other nicotine replacement products to assist in the patient's effort to quit. -The patient is interested in quitting smoking and will follow up with  his PCP for further management -Given his tobacco use, I also encouraged  the patient to discuss with his PCP regarding lung cancer screening  Orders Placed This Encounter  Procedures  . CT ABDOMEN PELVIS W CONTRAST    Standing Status:   Future    Standing Expiration Date:   07/05/2019    Order Specific Question:   ** REASON FOR EXAM (FREE TEXT)    Answer:   Extensive RLE DVT, rule out venous obstruction in the abdomen    Order Specific Question:   If indicated for the ordered procedure, I authorize the administration of contrast media per Radiology protocol    Answer:   Yes    Order Specific Question:   Preferred imaging location?    Answer:   [facility] Specific Question:   Is Oral Contrast requested for this exam?    Answer:   Yes, Per Radiology protocol    Order Specific Question:   Radiology Contrast Protocol - do NOT remove file path    Answer:   \\[email or web address]  . CBC with Differential ([facility] Only)    Standing Status:   Future    Standing Expiration Date:   08/09/2019  . CMP ([facility] only)    Standing Status:   Future    Standing Expiration Date:   08/09/2019   All questions were answered. The patient knows to call the clinic with any problems, questions or concerns.  Return in 3 months for labs and clinic follow-up.  Tish Men, MD 07/05/2018 9:38 AM   CHIEF COMPLAINTS/PURPOSE OF CONSULTATION:  "I am doing fine"  HISTORY OF PRESENTING ILLNESS:  Brian Brock 73 y.o. male is here because of newly diagnosed VTE and PTE. Patient was seen in cardiology clinic in early 05/2018, during which visit he reported some swelling in the right lower extremity and vague dyspnea. Doppler of the lower extremity in the office showed extensive right lower extremity DVT. The patient was sent to the ER, where CTA chest showed bilateral non-occlusive PTE. He was started on heparin gtt and sicharged home on Xarelto.  Patient reports  that since starting Xarelto, right lower extremity leg swelling has overall improved.  He recently had an episode of viral upper respiratory tract infection, and is still having intermittent coughing without any sputum production.  He currently smokes half a pack per day for the past 60 years.  He is up-to-date with colon cancer screening, with the last colonoscopy approximately 2 years ago that was reportedly normal.  He reports history of acid reflux and has episodic vague periumbilical discomfort.  His EGD in 10/2017 was normal.  He denies any fever, chill, night sweats, weight change, lymphadenopathy, chest pain, dyspnea, nausea, vomiting, diarrhea, hematochezia, or melena.  MEDICAL HISTORY:  Past Medical History:  Diagnosis Date  . AF (atrial fibrillation) ([place])    happened after CABG, has not had it since  . Allergic rhinitis   . Allergy   . Anginal pain ([place]) 1997  . Arthritis    "lower back" (09/26/2015)  . Childhood asthma   . Chronic bronchitis ([place])    Yearly  . COPD (chronic obstructive pulmonary disease) ([place])   . Coronary artery disease    Multiple stents  . Dyslipidemia   . Dysrhythmia   . Femoral hernia of right side 12/28/2016  . GERD (gastroesophageal reflux disease)   . History of hiatal hernia   . Hyperlipemia   . Hypertension   . Hypothyroidism   . Irritable bowel syndrome   . Myocardial infarct ([place])  1997   "mild"  . PE (pulmonary thromboembolism) ([place]) 05/2018  . Peripheral vascular disease ([place])    prior stenting  . Restless legs     SURGICAL HISTORY: Past Surgical History:  Procedure Laterality Date  . CARDIAC CATHETERIZATION  2004   had remote tandem stents in 1997 and 1998 for LAD lesions, RCA stent in 1997 , midprto=ion, and a proximal RCA  SENT IN 1999, all DES; some were IVIS  guide.  [name] CARDIAC CATHETERIZATION  09/07/2012   demonstrated severe 2- vessel disease,patent LAD,patient circ and patent right stents, but 90% ostial LAD whic was new.  .  CAROTID DOPPLER  100/24/11   RGT BULB and PROXIMAL ICA 0-49%; LFT. ICA  0-49 ; RGT SUBCLAVIAN ARTERY < 50% ; LFT SUBCLAVIAN normal patency; RGT  and LFT  VERTEBRAL ARTERIES  . COLONOSCOPY  11/06/2016   Diverticulosis of colon. Internal hemorrhoids.   . CORONARY ANGIOPLASTY    . CORONARY ANGIOPLASTY WITH STENT PLACEMENT  '97, '98, '04, '06, '08, '10   seven cardiac stents  . CORONARY ARTERY BYPASS GRAFT N/A 09/12/2012   Procedure: OFF PUMP CORONARY ARTERY BYPASS GRAFTING (CABG);  Surgeon: Melrose Nakayama, MD;  Location: [facility];  Service: Open Heart Surgery;  Laterality: N/A;  Times two using left internal mammary artery and endoscopically harvested right saphenous vein  ;LIMA-LAD and SVG -OM1  . DOPPLER ECHOCARDIOGRAPHY  11/23/2012,09/30/2010    LV EF 50-55%----09/30/2010-LV EF = >55%  . ENDARTERECTOMY FEMORAL Right 12/19/2015   Procedure: ENDARTERECTOMY RIGHT FEMORAL ARTERY;  Surgeon: Serafina Mitchell, MD;  Location: [facility];  Service: Vascular;  Laterality: Right;  . event monitor  11/04/2012-11/17/2012   pt denies this hx on 07/24/2014  . FEMORAL ARTERY STENT Bilateral 7/06  8/08   Lt SFA '06, Rt SFA 8/08  . HERNIA REPAIR  10/2008   "repaired w/gallbladder OR"  . HERNIA REPAIR  07/2016  . INGUINAL HERNIA REPAIR Right 12/28/2016   Procedure: OPEN REPAIR RIGHT GROIN HERNIA WITH MESH;  Surgeon: Fanny Skates, MD;  Location: [facility];  Service: General;  Laterality: Right;  . LAPAROSCOPIC CHOLECYSTECTOMY  June 2010  . LEFT HEART CATHETERIZATION WITH CORONARY ANGIOGRAM N/A 09/07/2012   Procedure: LEFT HEART CATHETERIZATION WITH CORONARY ANGIOGRAM;  Surgeon: Troy Sine, MD;  Location: [facility] CATH LAB;  Service: Cardiovascular;  Laterality: N/A;  . LEFT HEART CATHETERIZATION WITH CORONARY/GRAFT ANGIOGRAM N/A 07/24/2014   Procedure: LEFT HEART CATHETERIZATION WITH Beatrix Fetters;  Surgeon: Troy Sine, MD; LAD 70%, 50% ISR, mLAD 99%, CFX 30%, patent stent, RCA patent, LIMA-LAD occluded, SVG-OM  1 patent, EF 50-55%  . LEXISCAN STRESS TEST  09/06/2012  . LOWER ARTERIAL DOPPLER  03/25/2012   FGT CIA 0-49%; RGT CFA AND SFA AT STENT 0-49%; RGT DISTAL SFA = <50%; LFT. EIA  50-69%; LFT. CFA 0-49% ;;LFT. SFA  AT STENT NORMAL PATENCY  . NM MYOVIEW LTD  05/06/2012   EF -STUDY NOT GATED, NORMAL STRESS NUCLEAR ,NORMAL LV FUNCTION  . PATCH ANGIOPLASTY Right 12/19/2015   Procedure: PATCH ANGIOPLASTY RIGHT FEMORAL ARTERY;  Surgeon: Serafina Mitchell, MD;  Location: [facility];  Service: Vascular;  Laterality: Right;  . PERCUTANEOUS CORONARY STENT INTERVENTION (PCI-S) Right 07/24/2014   Procedure: PERCUTANEOUS CORONARY STENT INTERVENTION (PCI-S);  Surgeon: Troy Sine, MD; ostial LAD PTCA with angioscope scoring balloon, 3.0 x 34 mm Resolute DES extending from the ostium to the mid LAD, covering all significant stenoses   . PERIPHERAL VASCULAR CATHETERIZATION N/A 09/26/2015   Procedure:  Abdominal Aortogram w/Lower Extremity;  Surgeon: Lorretta Harp, MD;  Location: [facility] CV LAB;  Service: Cardiovascular;  Laterality: N/A;  . PERIPHERAL VASCULAR CATHETERIZATION Right 02/20/2016   Procedure: Peripheral Vascular Atherectomy;  Surgeon: Lorretta Harp, MD;  Location: [facility] CV LAB;  Service: Cardiovascular;  Laterality: Right;  SFA  . PERIPHERAL VASCULAR CATHETERIZATION Right 02/20/2016   Procedure: Peripheral Vascular Balloon Angioplasty;  Surgeon: Lorretta Harp, MD;  Location: [facility] CV LAB;  Service: Cardiovascular;  Laterality: Right;  SFA  . TONSILLECTOMY  1950's  . UPPER GASTROINTESTINAL ENDOSCOPY      SOCIAL HISTORY: Social History   Socioeconomic History  . Marital status: Married    Spouse name: Not on file  . Number of children: Not on file  . Years of education: Not on file  . Highest education level: Not on file  Occupational History  . Not on file  Social Needs  . Financial resource strain: Not on file  . Food insecurity:    Worry: Not on file    Inability: Not on file   . Transportation needs:    Medical: Not on file    Non-medical: Not on file  Tobacco Use  . Smoking status: Current Every Day Smoker    Packs/day: 0.50    Years: 56.00    Pack years: 28.00    Types: Cigarettes    Last attempt to quit: 03/25/2016    Years since quitting: 2.2  . Smokeless tobacco: Former Systems developer    Types: Chew  Substance and Sexual Activity  . Alcohol use: Yes    Alcohol/week: 1.0 standard drinks    Types: 1 Standard drinks or equivalent per week    Comment: 09/26/2015 "varies; might have a few drinks q 2 months"  . Drug use: No  . Sexual activity: Not Currently  Lifestyle  . Physical activity:    Days per week: Not on file    Minutes per session: Not on file  . Stress: Not on file  Relationships  . Social connections:    Talks on phone: Not on file    Gets together: Not on file    Attends religious service: Not on file    Active member of club or organization: Not on file    Attends meetings of clubs or organizations: Not on file    Relationship status: Not on file  . Intimate partner violence:    Fear of current or ex partner: Not on file    Emotionally abused: Not on file    Physically abused: Not on file    Forced sexual activity: Not on file  Other Topics Concern  . Not on file  Social History Narrative  . Not on file    FAMILY HISTORY: Family History  Problem Relation Age of Onset  . Coronary artery disease Father 19  . Heart attack Father   . Coronary artery disease Brother 26       CABG in 2000  . Deep vein thrombosis Brother   . Colon cancer Neg Hx   . Esophageal cancer Neg Hx   . Rectal cancer Neg Hx   . Stomach cancer Neg Hx     ALLERGIES:  is allergic to other; metoprolol; tricor [fenofibrate]; clarithromycin; fenofibrate micronized; levofloxacin; metaxalone; niaspan [niacin]; fish oil; meloxicam; and sulfadiazine.  MEDICATIONS:  Current Outpatient Medications  Medication Sig Dispense Refill  . albuterol (PROAIR HFA) 108 (90 Base)  MCG/ACT inhaler Inhale 2 puffs every 6 hours as  needed (Patient taking differently: Inhale 2 puffs into the lungs every 6 (six) hours as needed for wheezing or shortness of breath. ) 1 Inhaler 12  . aspirin EC 81 MG tablet Take 81 mg by mouth daily.     . Biotin 1000 MCG tablet Take 1,000 mcg by mouth daily.    [name] BYSTOLIC 5 MG tablet TAKE 1/2 TABLET ONCE DAILY (Patient taking differently: Take 2.5 mg by mouth daily. ) 30 tablet 6  . cetirizine (ZYRTEC) 10 MG tablet Take 10 mg by mouth daily.     . cholestyramine (QUESTRAN) 4 g packet Take 0.5 packets (2 g total) by mouth 2 (two) times daily with a meal. 60 each 12  . cholestyramine (QUESTRAN) 4 GM/DOSE powder USE 1 SCOOPFUL AS DIRECTED FOUR TIMES DAILY    . cholestyramine (QUESTRAN) 4 GM/DOSE powder USE 1 SCOOPFUL QID    . cyclobenzaprine (FLEXERIL) 10 MG tablet Take 10 mg by mouth 3 (three) times daily as needed for muscle spasms.     [name] doxycycline (VIBRA-TABS) 100 MG tablet Take 1 tablet by mouth 2 (two) times daily.    . fluconazole (DIFLUCAN) 150 MG tablet Take 1 tablet (150 mg total) by mouth 2 (two) times a week. Wednesday and Saturday 8 tablet 0  . fluticasone (FLONASE) 50 MCG/ACT nasal spray Place 2 sprays into both nostrils daily as needed for allergies or rhinitis.     [name] ipratropium (ATROVENT) 0.03 % nasal spray Place 1-2 sprays into both nostrils 2 (two) times daily as needed for rhinitis.     [name] isosorbide mononitrate (IMDUR) 30 MG 24 hr tablet TAKE 1/2 TABLET BY MOUTH EVERY DAY (Patient taking differently: Take 15 mg by mouth daily. ) 45 tablet 2  . levothyroxine (SYNTHROID, LEVOTHROID) 25 MCG tablet Take 25 mcg by mouth daily before breakfast.     . loperamide (IMODIUM A-D) 2 MG tablet Take 2 mg by mouth 4 (four) times daily as needed for diarrhea or loose stools.    [name] losartan (COZAAR) 50 MG tablet Take 1 tablet (50 mg total) by mouth daily. 30 tablet 0  . nitroGLYCERIN (NITROSTAT) 0.4 MG SL tablet PLACE 1 TABLET UNDER THE TONGUE, MAY  REPEAT EVERY 5 MINUTES MAX 3 TABLETS (Patient taking differently: Place 0.4 mg under the tongue every 5 (five) minutes as needed for chest pain. ) 25 tablet 11  . pantoprazole (PROTONIX) 40 MG tablet Take 1 tablet (40 mg total) by mouth 2 (two) times daily. (Patient taking differently: Take 40 mg by mouth at bedtime. ) 60 tablet 0  . Probiotic Product (PROBIOTIC PO) Take 1 capsule by mouth daily.     . rivaroxaban (XARELTO) 20 MG TABS tablet Take 20 mg by mouth daily with supper.    . rosuvastatin (CRESTOR) 20 MG tablet TAKE ONE TABLET BY MOUTH EVERY DAY (Patient taking differently: Take 20 mg by mouth daily. ) 90 tablet 3  . triamcinolone cream (KENALOG) 0.1 % Apply 1 application topically as needed (for itching).     Alveda Reasons 20 MG TABS tablet Take 1 tablet (20 mg total) by mouth daily. 7 tablet 1  . zolpidem (AMBIEN) 10 MG tablet Take 5-10 mg by mouth at bedtime.      No current facility-administered medications for this visit.     REVIEW OF SYSTEMS:   Constitutional: ( - ) fevers, ( - )  chills , ( - ) night sweats Eyes: ( - ) blurriness of vision, ( - ) double vision, ( - )  watery eyes Ears, nose, mouth, throat, and face: ( - ) mucositis, ( - ) sore throat Respiratory: ( + ) cough, ( - ) dyspnea, ( - ) wheezes Cardiovascular: ( - ) palpitation, ( - ) chest discomfort, ( + ) lower extremity swelling Gastrointestinal:  ( - ) nausea, ( - ) change in bowel habits Skin: ( - ) abnormal skin rashes Lymphatics: ( - ) new lymphadenopathy, ( - ) easy bruising Neurological: ( - ) numbness, ( - ) tingling, ( - ) new weaknesses Behavioral/Psych: ( - ) mood change, ( - ) new changes  All other systems were reviewed with the patient and are negative.  PHYSICAL EXAMINATION: ECOG PERFORMANCE STATUS: 0 - Asymptomatic  Vitals:   07/05/18 0832  BP: 120/62  Pulse: (!) 54  Temp: 97.8 F (36.6 C)  SpO2: 97%   Filed Weights   07/05/18 0832  Weight: 190 lb (86.2 kg)    GENERAL: alert, no  distress and comfortable SKIN: skin color, texture, turgor are normal, no rashes or significant lesions EYES: conjunctiva are pink and non-injected, sclera clear OROPHARYNX: no exudate, no erythema; lips, buccal mucosa, and tongue normal  NECK: supple, non-tender LYMPH:  no palpable lymphadenopathy in the cervical LUNGS: clear to auscultation with normal breathing effort HEART: regular rate & rhythm, no murmurs, no lower extremity edema ABDOMEN: soft, non-tender, non-distended, normal bowel sounds Musculoskeletal: no cyanosis of digits and no clubbing  PSYCH: alert & oriented x 3, fluent speech NEURO: no focal motor/sensory deficits  LABORATORY DATA:  I have reviewed the data as listed Lab Results  Component Value Date   WBC 7.9 07/05/2018   HGB 14.2 07/05/2018   HCT 43.0 07/05/2018   MCV 90.3 07/05/2018   PLT 218 07/05/2018   Lab Results  Component Value Date   NA 142 07/05/2018   K 4.3 07/05/2018   CL 107 07/05/2018   CO2 28 07/05/2018    RADIOGRAPHIC STUDIES: I have personally reviewed the radiological images as listed and agreed with the findings in the report. No results found.

## 2018-07-04 ENCOUNTER — Telehealth: Payer: Self-pay | Admitting: Cardiovascular Disease

## 2018-07-04 MED ORDER — XARELTO 20 MG PO TABS: 20.0000 mg | ORAL_TABLET | Freq: Every day | ORAL | 1 refills | Status: DC

## 2018-07-04 NOTE — Telephone Encounter (Signed)
Spoke with pt and advised of pharmD recommendations. Informed pt that Xarelto 20 mg sample 7-tablet bottle will be available at front desk for pick up. Pt agreeable with this   Drug name: XARELTO       Strength: 20 MG        Qty: 7 TABLETS  LOT: [id number]  Exp.Date: 03/2020

## 2018-07-04 NOTE — Telephone Encounter (Signed)
New Message   Patient calling the office for samples of medication:   1.  What medication and dosage are you requesting samples for? Xarelto  2.  Are you currently out of this medication? No, 3 remaining

## 2018-07-04 NOTE — Telephone Encounter (Signed)
Patient got Rx for Xarelto 20mg  daily #90 tabs from DR Unk Lightning [facility]) on 05/31/2018.   Patient needs to pick up prescription or contact Dr Unk Lightning for therapeutic alternative if unable to afford.  Okay to give patient Xarelto 20mg  sample (1 bottle = 7 tabs) , until prescription clarified with provider at [facility]

## 2018-07-05 ENCOUNTER — Other Ambulatory Visit: Payer: Self-pay

## 2018-07-05 ENCOUNTER — Encounter: Payer: Self-pay | Admitting: Hematology

## 2018-07-05 ENCOUNTER — Inpatient Hospital Stay: Payer: Medicare Other | Attending: Hematology | Admitting: Hematology

## 2018-07-05 ENCOUNTER — Inpatient Hospital Stay: Payer: Medicare Other

## 2018-07-05 ENCOUNTER — Telehealth: Payer: Self-pay | Admitting: Hematology

## 2018-07-05 VITALS — BP 120/62 | HR 54 | Temp 97.8°F | Ht 68.0 in | Wt 190.0 lb

## 2018-07-05 DIAGNOSIS — F17210 Nicotine dependence, cigarettes, uncomplicated: Secondary | ICD-10-CM | POA: Diagnosis not present

## 2018-07-05 DIAGNOSIS — K219 Gastro-esophageal reflux disease without esophagitis: Secondary | ICD-10-CM | POA: Diagnosis not present

## 2018-07-05 DIAGNOSIS — I2699 Other pulmonary embolism without acute cor pulmonale: Secondary | ICD-10-CM | POA: Insufficient documentation

## 2018-07-05 DIAGNOSIS — R1033 Periumbilical pain: Secondary | ICD-10-CM

## 2018-07-05 DIAGNOSIS — Z7982 Long term (current) use of aspirin: Secondary | ICD-10-CM | POA: Diagnosis not present

## 2018-07-05 DIAGNOSIS — Z79899 Other long term (current) drug therapy: Secondary | ICD-10-CM | POA: Diagnosis not present

## 2018-07-05 DIAGNOSIS — Z7901 Long term (current) use of anticoagulants: Secondary | ICD-10-CM | POA: Diagnosis not present

## 2018-07-05 DIAGNOSIS — I4891 Unspecified atrial fibrillation: Secondary | ICD-10-CM | POA: Diagnosis not present

## 2018-07-05 DIAGNOSIS — I824Y1 Acute embolism and thrombosis of unspecified deep veins of right proximal lower extremity: Secondary | ICD-10-CM

## 2018-07-05 DIAGNOSIS — Z720 Tobacco use: Secondary | ICD-10-CM

## 2018-07-05 DIAGNOSIS — M79661 Pain in right lower leg: Secondary | ICD-10-CM

## 2018-07-05 DIAGNOSIS — M7989 Other specified soft tissue disorders: Secondary | ICD-10-CM

## 2018-07-05 LAB — CMP (CANCER CENTER ONLY)
ALT: 16 U/L (ref 0–44)
AST: 19 U/L (ref 15–41)
Albumin: 4 g/dL (ref 3.5–5.0)
Alkaline Phosphatase: 78 U/L (ref 38–126)
Anion gap: 7 (ref 5–15)
BUN: 18 mg/dL (ref 8–23)
CO2: 28 mmol/L (ref 22–32)
Calcium: 9.4 mg/dL (ref 8.9–10.3)
Chloride: 107 mmol/L (ref 98–111)
Creatinine: 1.2 mg/dL (ref 0.61–1.24)
GFR, Est AFR Am: >60 mL/min (ref >60)
GFR, Estimated: >60 mL/min (ref >60)
Glucose, Bld: 106 mg/dL — ABNORMAL HIGH (ref 70–99)
Potassium: 4.3 mmol/L (ref 3.5–5.1)
Sodium: 142 mmol/L (ref 135–145)
Total Bilirubin: 0.4 mg/dL (ref 0.3–1.2)
Total Protein: 6.3 g/dL — ABNORMAL LOW (ref 6.5–8.1)

## 2018-07-05 LAB — CBC WITH DIFFERENTIAL (CANCER CENTER ONLY)
Abs Immature Granulocytes: 0.03 10*3/uL (ref 0.00–0.07)
Basophils Absolute: 0.1 10*3/uL (ref 0.0–0.1)
Basophils Relative: 1 %
Eosinophils Absolute: 0.2 10*3/uL (ref 0.0–0.5)
Eosinophils Relative: 2 %
HCT: 43 % (ref 39.0–52.0)
Hemoglobin: 14.2 g/dL (ref 13.0–17.0)
Immature Granulocytes: 0 %
Lymphocytes Relative: 29 %
Lymphs Abs: 2.3 10*3/uL (ref 0.7–4.0)
MCH: 29.8 pg (ref 26.0–34.0)
MCHC: 33 g/dL (ref 30.0–36.0)
MCV: 90.3 fL (ref 80.0–100.0)
Monocytes Absolute: 0.7 10*3/uL (ref 0.1–1.0)
Monocytes Relative: 8 %
Neutro Abs: 4.7 10*3/uL (ref 1.7–7.7)
Neutrophils Relative %: 60 %
Platelet Count: 218 10*3/uL (ref 150–400)
RBC: 4.76 MIL/uL (ref 4.22–5.81)
RDW: 13.4 % (ref 11.5–15.5)
WBC Count: 7.9 10*3/uL (ref 4.0–10.5)
nRBC: 0 % (ref 0.0–0.2)

## 2018-07-05 NOTE — Telephone Encounter (Signed)
Appointments scheduled letter/calendar mailed per 2/18 los °

## 2018-07-15 ENCOUNTER — Encounter (HOSPITAL_BASED_OUTPATIENT_CLINIC_OR_DEPARTMENT_OTHER): Payer: Self-pay

## 2018-07-15 ENCOUNTER — Ambulatory Visit (HOSPITAL_BASED_OUTPATIENT_CLINIC_OR_DEPARTMENT_OTHER)
Admission: RE | Admit: 2018-07-15 | Discharge: 2018-07-15 | Disposition: A | Discharge status: Home / Self Care | Payer: Medicare Other | Source: Ambulatory Visit | Attending: Hematology | Admitting: Hematology

## 2018-07-15 DIAGNOSIS — I2699 Other pulmonary embolism without acute cor pulmonale: Secondary | ICD-10-CM | POA: Insufficient documentation

## 2018-07-15 DIAGNOSIS — M7989 Other specified soft tissue disorders: Secondary | ICD-10-CM | POA: Diagnosis present

## 2018-07-15 DIAGNOSIS — R1033 Periumbilical pain: Secondary | ICD-10-CM | POA: Insufficient documentation

## 2018-07-15 DIAGNOSIS — I824Y1 Acute embolism and thrombosis of unspecified deep veins of right proximal lower extremity: Secondary | ICD-10-CM | POA: Diagnosis present

## 2018-07-15 DIAGNOSIS — M79661 Pain in right lower leg: Secondary | ICD-10-CM | POA: Diagnosis present

## 2018-07-15 MED ORDER — IOHEXOL 300 MG/ML  SOLN
100.0000 mL | Freq: Once | INTRAMUSCULAR | Status: AC | PRN
  Administered 2018-07-15: 100 mL via INTRAVENOUS

## 2018-07-18 ENCOUNTER — Telehealth: Payer: Self-pay | Admitting: *Deleted

## 2018-07-18 NOTE — Telephone Encounter (Signed)
Per MD, notified pt of CT scan results. Pt verbalized understanding.

## 2018-08-30 ENCOUNTER — Ambulatory Visit: Payer: Medicare Other | Admitting: Cardiovascular Disease

## 2018-10-04 ENCOUNTER — Other Ambulatory Visit: Payer: Self-pay

## 2018-10-04 ENCOUNTER — Telehealth: Payer: Self-pay | Admitting: Hematology

## 2018-10-04 ENCOUNTER — Inpatient Hospital Stay (HOSPITAL_BASED_OUTPATIENT_CLINIC_OR_DEPARTMENT_OTHER): Payer: Medicare Other | Admitting: Hematology

## 2018-10-04 ENCOUNTER — Encounter: Payer: Self-pay | Admitting: Hematology

## 2018-10-04 ENCOUNTER — Inpatient Hospital Stay: Payer: Medicare Other | Attending: Hematology

## 2018-10-04 VITALS — BP 132/66 | HR 60 | Temp 98.0°F | Resp 18 | Ht 69.0 in | Wt 185.4 lb

## 2018-10-04 DIAGNOSIS — F17210 Nicotine dependence, cigarettes, uncomplicated: Secondary | ICD-10-CM | POA: Insufficient documentation

## 2018-10-04 DIAGNOSIS — I824Y1 Acute embolism and thrombosis of unspecified deep veins of right proximal lower extremity: Secondary | ICD-10-CM

## 2018-10-04 DIAGNOSIS — K4090 Unilateral inguinal hernia, without obstruction or gangrene, not specified as recurrent: Secondary | ICD-10-CM | POA: Diagnosis not present

## 2018-10-04 DIAGNOSIS — Z7901 Long term (current) use of anticoagulants: Secondary | ICD-10-CM | POA: Insufficient documentation

## 2018-10-04 DIAGNOSIS — Z79899 Other long term (current) drug therapy: Secondary | ICD-10-CM | POA: Insufficient documentation

## 2018-10-04 DIAGNOSIS — K4190 Unilateral femoral hernia, without obstruction or gangrene, not specified as recurrent: Secondary | ICD-10-CM

## 2018-10-04 DIAGNOSIS — I2699 Other pulmonary embolism without acute cor pulmonale: Secondary | ICD-10-CM

## 2018-10-04 DIAGNOSIS — Z720 Tobacco use: Secondary | ICD-10-CM

## 2018-10-04 LAB — CBC WITH DIFFERENTIAL (CANCER CENTER ONLY)
Abs Immature Granulocytes: 0.02 10*3/uL (ref 0.00–0.07)
Basophils Absolute: 0.1 10*3/uL (ref 0.0–0.1)
Basophils Relative: 1 %
Eosinophils Absolute: 0.2 10*3/uL (ref 0.0–0.5)
Eosinophils Relative: 3 %
HCT: 45.5 % (ref 39.0–52.0)
Hemoglobin: 14.9 g/dL (ref 13.0–17.0)
Immature Granulocytes: 0 %
Lymphocytes Relative: 25 %
Lymphs Abs: 2.2 10*3/uL (ref 0.7–4.0)
MCH: 28.9 pg (ref 26.0–34.0)
MCHC: 32.7 g/dL (ref 30.0–36.0)
MCV: 88.3 fL (ref 80.0–100.0)
Monocytes Absolute: 0.8 10*3/uL (ref 0.1–1.0)
Monocytes Relative: 9 %
Neutro Abs: 5.2 10*3/uL (ref 1.7–7.7)
Neutrophils Relative %: 62 %
Platelet Count: 229 10*3/uL (ref 150–400)
RBC: 5.15 MIL/uL (ref 4.22–5.81)
RDW: 13.1 % (ref 11.5–15.5)
WBC Count: 8.5 10*3/uL (ref 4.0–10.5)
nRBC: 0 % (ref 0.0–0.2)

## 2018-10-04 LAB — CMP (CANCER CENTER ONLY)
ALT: 15 U/L (ref 0–44)
AST: 17 U/L (ref 15–41)
Albumin: 4 g/dL (ref 3.5–5.0)
Alkaline Phosphatase: 81 U/L (ref 38–126)
Anion gap: 6 (ref 5–15)
BUN: 20 mg/dL (ref 8–23)
CO2: 27 mmol/L (ref 22–32)
Calcium: 8.8 mg/dL — ABNORMAL LOW (ref 8.9–10.3)
Chloride: 107 mmol/L (ref 98–111)
Creatinine: 1.07 mg/dL (ref 0.61–1.24)
GFR, Est AFR Am: >60 mL/min (ref >60)
GFR, Estimated: >60 mL/min (ref >60)
Glucose, Bld: 119 mg/dL — ABNORMAL HIGH (ref 70–99)
Potassium: 4.1 mmol/L (ref 3.5–5.1)
Sodium: 140 mmol/L (ref 135–145)
Total Bilirubin: 0.6 mg/dL (ref 0.3–1.2)
Total Protein: 6.2 g/dL — ABNORMAL LOW (ref 6.5–8.1)

## 2018-10-04 NOTE — Telephone Encounter (Signed)
Appointments scheduled letter/calendar mailed per 5/19 los

## 2018-10-04 NOTE — Progress Notes (Signed)
[facility] OFFICE PROGRESS NOTE  Patient Care Team: [name], MD as PCP - General (Family Medicine) Troy Sine, MD as PCP - Cardiology (Cardiology) Lorretta Harp, MD as Consulting Physician (Cardiology) Troy Sine, MD as Consulting Physician (Cardiology) Deneise Lever, MD as Consulting Physician (Pulmonary Disease)  HEME/ONC OVERVIEW: 1. Unprovoked RLE DVT and bilateral PTE  -05/2018: doppler showed extensive acute DVT involving the R femoral, popliteal, posterior tibial and peroneal vein; CTA chest showed small bilateral PTE without evidence of RV strain; no malignancy on CT AP -05/2018 - present: Xarelto  TREATMENT REGIMEN:  05/24/2018 - present: Xarelto  ASSESSMENT & PLAN:   Unprovoked RLE DVT and bilateral PTE  -Patient is currently on Xarelto for recent VTE and tolerating it well without significant bleeding or bruising -Continue Xarelto 20mg  daily for at least 6 months (ie 11/2018) -If not evidence of worsening VTE at that time, we may consider reducing Xarelto dose by half for secondary prophylaxis -Goal of anticoagulation is life-long, given the unprovoked DVT and PTE -I reinforced the importance of preventive strategies such as avoiding hormonal supplement, avoiding cigarette smoking, keeping up-to-date with screening programs for early cancer detection, frequent ambulation for long distance travel and aggressive DVT prophylaxis in all surgical settings. -Should he need any interruption of the anticoagulation for elective procedures in the future, feel free to contact me regarding peri-operative management.  R inguinal hernia -I independently reviewed the radiologic images of recent CT abdomen/pelvis, and agree with findings as documented -In summary, CT abdomen/pelvis did not show any evidence of malignancy, but did demonstrate an enlarging right inguinal hernia containing fat; there was no evidence of entrapped bowel\ -Patient had open  inguinal hernia repair in 12/2016 by Dr. Dalbert Batman -Given that he is asymptomatic from the hernia and he needs at least six months of anticoagulation, we will continue anticoagulation for now and reassess at the next visit -I counseled the patient on any concerning symptoms, including enlarging right inguinal hernia, pain, hematochezia, or melena, for which he should seek care promptly -Should he require surgical repair, we will be glad to discuss the timing of his surgery in conjunction with his anticoagulation and perioperative anticoagulation management  Tobacco cessation -I spent some time counseling the patient on importance of tobacco cessation at the last visit -Patient reports that he is still smoking approximately half a pack per day -I counseled the patient again on the importance of smoking cessation, especially given the risk of recurrent VTE while smoking -Patient has nicotine patch and gum at home, but has not used it; I encouraged patient to talk to his PCP about other pharmacologic interventions for smoking cessation -Lung cancer screening per PCP   Orders Placed This Encounter  Procedures  . CBC with Differential (Cancer Center Only)    Standing Status:   Future    Standing Expiration Date:   11/08/2019  . CMP ([facility] only)    Standing Status:   Future    Standing Expiration Date:   11/08/2019  . D-dimer, quantitative (not at [facility])    Standing Status:   Future    Standing Expiration Date:   11/08/2019    All questions were answered. The patient knows to call the clinic with any problems, questions or concerns. No barriers to learning was detected.  A total of more than 25 minutes were spent face-to-face with the patient during this encounter and over half of that time was spent on counseling and coordination  of care as outlined above.   Return in 3 months for labs and clinic follow-up.  Tish Men, MD 10/04/2018 10:19 AM  CHIEF COMPLAINT: "I am just a little tired  "  INTERVAL HISTORY: [name] returns to clinic for follow-up of recent PTE and right lower extremity DVT.  He reports that he has been tolerating Xarelto well, and denies any abnormal bleeding or bruising.  He has history of right inguinal hernia repair in 12/2016 by Dr. Dalbert Batman.  Over the past year, he has felt a stably enlarged right groin swelling.  He reports occasional discomfort, especially when coughing, but he otherwise denies any persistent pain, rapid enlargement of swelling, hematochezia, or melena.  Since being out of work due to [employer], he has felt a little bit more tired because he has not been doing his usual activities.  He still smokes half a pack per day, which is overall unchanged.  He denies any other complaint today.  REVIEW OF SYSTEMS:   Constitutional: ( - ) fevers, ( - )  chills , ( - ) night sweats Eyes: ( - ) blurriness of vision, ( - ) double vision, ( - ) watery eyes Ears, nose, mouth, throat, and face: ( - ) mucositis, ( - ) sore throat Respiratory: ( - ) cough, ( - ) dyspnea, ( - ) wheezes Cardiovascular: ( - ) palpitation, ( - ) chest discomfort, ( - ) lower extremity swelling Gastrointestinal:  ( - ) nausea, ( - ) heartburn, ( - ) change in bowel habits Skin: ( - ) abnormal skin rashes Lymphatics: ( - ) new lymphadenopathy, ( - ) easy bruising Neurological: ( - ) numbness, ( - ) tingling, ( - ) new weaknesses Behavioral/Psych: ( - ) mood change, ( - ) new changes  All other systems were reviewed with the patient and are negative.  I have reviewed the past medical history, past surgical history, social history and family history with the patient and they are unchanged from previous note.  ALLERGIES:  is allergic to other; clarithromycin; fenofibrate micronized; metoprolol; tricor [fenofibrate]; fish oil; levofloxacin; meloxicam; metaxalone; niaspan [niacin]; and sulfadiazine.  MEDICATIONS:  Current Outpatient Medications  Medication Sig Dispense  Refill  . albuterol (PROAIR HFA) 108 (90 Base) MCG/ACT inhaler Inhale 2 puffs every 6 hours as needed (Patient taking differently: Inhale 2 puffs into the lungs every 6 (six) hours as needed for wheezing or shortness of breath. ) 1 Inhaler 12  . aspirin EC 81 MG tablet Take 81 mg by mouth daily.     . Biotin 1000 MCG tablet Take 1,000 mcg by mouth daily.    [name] BYSTOLIC 5 MG tablet TAKE 1/2 TABLET ONCE DAILY (Patient taking differently: Take 2.5 mg by mouth daily. ) 30 tablet 6  . cetirizine (ZYRTEC) 10 MG tablet Take 10 mg by mouth daily.     . cholestyramine (QUESTRAN) 4 g packet Take 0.5 packets (2 g total) by mouth 2 (two) times daily with a meal. 60 each 12  . cyclobenzaprine (FLEXERIL) 10 MG tablet Take 10 mg by mouth 3 (three) times daily as needed for muscle spasms.     . fluticasone (FLONASE) 50 MCG/ACT nasal spray Place 2 sprays into both nostrils daily as needed for allergies or rhinitis.     [name] ipratropium (ATROVENT) 0.03 % nasal spray Place 1-2 sprays into both nostrils 2 (two) times daily as needed for rhinitis.     [name] isosorbide mononitrate (IMDUR) 30 MG 24  hr tablet TAKE 1/2 TABLET BY MOUTH EVERY DAY (Patient taking differently: Take 15 mg by mouth daily. ) 45 tablet 2  . levothyroxine (SYNTHROID, LEVOTHROID) 25 MCG tablet Take 25 mcg by mouth daily before breakfast.     . losartan (COZAAR) 50 MG tablet Take 1 tablet (50 mg total) by mouth daily. 30 tablet 0  . pantoprazole (PROTONIX) 40 MG tablet Take 1 tablet (40 mg total) by mouth 2 (two) times daily. (Patient taking differently: Take 40 mg by mouth at bedtime. ) 60 tablet 0  . Probiotic Product (PROBIOTIC PO) Take 1 capsule by mouth daily.     . rosuvastatin (CRESTOR) 20 MG tablet TAKE ONE TABLET BY MOUTH EVERY DAY (Patient taking differently: Take 20 mg by mouth daily. ) 90 tablet 3  . XARELTO 20 MG TABS tablet Take 1 tablet (20 mg total) by mouth daily. 7 tablet 1  . zolpidem (AMBIEN) 10 MG tablet Take 5-10 mg by mouth at bedtime.      [name] loperamide (IMODIUM A-D) 2 MG tablet Take 2 mg by mouth 4 (four) times daily as needed for diarrhea or loose stools.    . nitroGLYCERIN (NITROSTAT) 0.4 MG SL tablet PLACE 1 TABLET UNDER THE TONGUE, MAY REPEAT EVERY 5 MINUTES MAX 3 TABLETS (Patient not taking: No sig reported) 25 tablet 11  . triamcinolone cream (KENALOG) 0.1 % Apply 1 application topically as needed (for itching).      No current facility-administered medications for this visit.     PHYSICAL EXAMINATION: ECOG PERFORMANCE STATUS: 1 - Symptomatic but completely ambulatory  Today's Vitals   10/04/18 0959  BP: 132/66  Pulse: 60  Resp: 18  Temp: 98 F (36.7 C)  TempSrc: Oral  SpO2: 97%  Weight: 185 lb 6.4 oz (84.1 kg)  Height: 5\' 9"  (1.753 m)  PainSc: 0-No pain   Body mass index is 27.38 kg/m.  Filed Weights   10/04/18 0959  Weight: 185 lb 6.4 oz (84.1 kg)    GENERAL: alert, no distress and comfortable SKIN: skin color, texture, turgor are normal, no rashes or significant lesions EYES: conjunctiva are pink and non-injected, sclera clear NECK: supple, non-tender LUNGS: clear to auscultation with normal breathing effort HEART: regular rate & rhythm and no murmurs and no lower extremity edema ABDOMEN: soft, non-tender, non-distended, normal bowel sounds Musculoskeletal: no cyanosis of digits and no clubbing, right inguinal hernia palpated, non-tender  PSYCH: alert & oriented x 3, fluent speech NEURO: no focal motor/sensory deficits  LABORATORY DATA:  I have reviewed the data as listed    Component Value Date/Time   NA 140 10/04/2018 0937   NA 142 02/07/2018 0915   K 4.1 10/04/2018 0937   CL 107 10/04/2018 0937   CO2 27 10/04/2018 0937   GLUCOSE 119 (H) 10/04/2018 0937   BUN 20 10/04/2018 0937   BUN 18 02/07/2018 0915   CREATININE 1.07 10/04/2018 0937   CREATININE 1.05 02/12/2016 1008   CALCIUM 8.8 (L) 10/04/2018 0937   PROT 6.2 (L) 10/04/2018 0937   PROT 6.0 02/07/2018 0915   ALBUMIN 4.0  10/04/2018 0937   ALBUMIN 3.9 02/07/2018 0915   AST 17 10/04/2018 0937   ALT 15 10/04/2018 0937   ALKPHOS 81 10/04/2018 0937   BILITOT 0.6 10/04/2018 0937   GFRNONAA >60 10/04/2018 0937   GFRAA >60 10/04/2018 0937    No results found for: SPEP, UPEP  Lab Results  Component Value Date   WBC 8.5 10/04/2018   NEUTROABS 5.2 10/04/2018  HGB 14.9 10/04/2018   HCT 45.5 10/04/2018   MCV 88.3 10/04/2018   PLT 229 10/04/2018      Chemistry      Component Value Date/Time   NA 140 10/04/2018 0937   NA 142 02/07/2018 0915   K 4.1 10/04/2018 0937   CL 107 10/04/2018 0937   CO2 27 10/04/2018 0937   BUN 20 10/04/2018 0937   BUN 18 02/07/2018 0915   CREATININE 1.07 10/04/2018 0937   CREATININE 1.05 02/12/2016 1008      Component Value Date/Time   CALCIUM 8.8 (L) 10/04/2018 0937   ALKPHOS 81 10/04/2018 0937   AST 17 10/04/2018 0937   ALT 15 10/04/2018 0937   BILITOT 0.6 10/04/2018 3818

## 2018-11-02 ENCOUNTER — Other Ambulatory Visit: Payer: Self-pay | Admitting: Cardiovascular Disease

## 2018-11-02 ENCOUNTER — Ambulatory Visit (HOSPITAL_COMMUNITY)
Admission: RE | Admit: 2018-11-02 | Discharge: 2018-11-02 | Disposition: A | Discharge status: Home / Self Care | Payer: Medicare Other | Source: Ambulatory Visit | Attending: Cardiovascular Disease | Admitting: Cardiovascular Disease

## 2018-11-02 ENCOUNTER — Ambulatory Visit (HOSPITAL_BASED_OUTPATIENT_CLINIC_OR_DEPARTMENT_OTHER)
Admission: RE | Admit: 2018-11-02 | Discharge: 2018-11-02 | Disposition: A | Discharge status: Home / Self Care | Payer: Medicare Other | Source: Ambulatory Visit | Attending: Cardiovascular Disease | Admitting: Cardiovascular Disease

## 2018-11-02 ENCOUNTER — Other Ambulatory Visit: Payer: Self-pay

## 2018-11-02 DIAGNOSIS — Z95828 Presence of other vascular implants and grafts: Secondary | ICD-10-CM

## 2018-11-02 DIAGNOSIS — I739 Peripheral vascular disease, unspecified: Secondary | ICD-10-CM | POA: Diagnosis present

## 2018-11-03 ENCOUNTER — Other Ambulatory Visit: Payer: Self-pay

## 2018-11-03 NOTE — Progress Notes (Signed)
Notes recorded by Lorretta Harp, MD on 11/03/2018 at 11:05 AM EDT  No change from prior study. Repeat in 12 months.   Peripheral artery disease,and s/p history of bilateral SFA stents.

## 2018-11-14 ENCOUNTER — Other Ambulatory Visit: Payer: Self-pay | Admitting: Cardiovascular Disease

## 2018-11-14 NOTE — Telephone Encounter (Signed)
°*  STAT* If patient is at the pharmacy, call can be transferred to refill team.   1. Which medications need to be refilled? (please list name of each medication and dose if known) XARELTO 20 MG TABS tablet  2. Which pharmacy/location (including street and city if local pharmacy) is medication to be sent to? [facility] FA[facility] # [phone]   Phone [phone]  3. Do they need a 30 day or 90 day supply? 90 day

## 2018-11-16 ENCOUNTER — Other Ambulatory Visit: Payer: Self-pay | Admitting: Cardiovascular Disease

## 2018-11-16 MED ORDER — XARELTO 20 MG PO TABS: 20.0000 mg | ORAL_TABLET | Freq: Every day | ORAL | 1 refills | Status: DC

## 2018-11-16 NOTE — Telephone Encounter (Signed)
Pt is a 14 yom requesting xarelto 20mg . Wt 84.1kg, scr 1.07(10/04/18), ccr 32ml/min, lov w/kilroy(06/09/18)

## 2018-11-16 NOTE — Telephone Encounter (Incomplete)
New Message             Patient is calling to let us know that the Rx is not at the pharmacy [facility])

## 2018-11-22 ENCOUNTER — Other Ambulatory Visit: Payer: Self-pay | Admitting: Cardiovascular Disease

## 2018-11-22 MED ORDER — XARELTO 20 MG PO TABS: 20.0000 mg | ORAL_TABLET | Freq: Every day | ORAL | 1 refills | Status: DC

## 2018-11-22 NOTE — Telephone Encounter (Signed)
°*  STAT* If patient is at the pharmacy, call can be transferred to refill team.   1. Which medications need to be refilled? (please list name of each medication and dose if known) Xarelto 20 mg  2. Which pharmacy/location (including street and city if local pharmacy) is medication to be sent to? Fax [phone]  3. Do they need a 30 day or 90 day supply? [place]

## 2018-11-24 NOTE — Telephone Encounter (Signed)
Pt calling requesting a refill on Xarelto sent to his [facility], fax# [phone]. Pt stated that he still has not gotten his medication as of yet. Please address

## 2018-11-24 NOTE — Telephone Encounter (Signed)
Refill was sent to [facility] by Raquel 2 days ago. We do not have a [employer] on his profile. There is no [employer] in [place] with the fax # provided. Called pt who states pharmacy is in [place], [place]. Unable to locate in Epic. He provided me with a phone number for a [place] Serve [phone] - spoke with her, states she works with multiple mail order pharmacies and acts like a broker to get a better medication deal for the patient. States they can give $50-60 discount from what local pharmacies give and that if he stays with them, he would not go into the donut hole next year because he would be self paying to stay out of the donut hole instead of having the rx be processed through his insurance. She states Xarelto would be coming from [place] and is still branded from the same manufacturer.  Advised pt Dr Claiborne Billings is in the office on Monday and could fax an rx then. He states he is worried about running out of Xarelto and cannot afford it at the pharmacy because a 3 month supply costs him > $1000 in the donut hole.  [place] who states he picked up a 3 month supply 1 month ago for $370. Pt states it will take 2 months for his medication to ship. Advised him the earliest an rx can be faxed is on Monday 7/13 when Dr Claiborne Billings and PharmD team are in the office. He verbalized understanding.

## 2018-11-28 MED ORDER — RIVAROXABAN 20 MG PO TABS: 20.0000 mg | ORAL_TABLET | Freq: Every day | ORAL | 1 refills | Status: DC

## 2018-11-28 NOTE — Telephone Encounter (Signed)
Xarelto refill:  Age 73, wt 86.6, SCr 1.07 - CrCl 76.4 Rx printed and given to Dr. Claiborne Billings for signature/mailing

## 2018-11-29 ENCOUNTER — Telehealth: Payer: Self-pay

## 2018-11-29 NOTE — Telephone Encounter (Signed)
Called patient, advised that visit for Thursday with Dr.Kelly 07/16 was a virtual visit.  Patient verbalized understanding, will try video visit-   Also patient needs medication faxed over to [phone] attn donna evans which I will do today per patients request.

## 2018-12-01 ENCOUNTER — Telehealth (INDEPENDENT_AMBULATORY_CARE_PROVIDER_SITE_OTHER): Payer: Medicare Other | Admitting: Cardiovascular Disease

## 2018-12-01 VITALS — BP 116/71 | HR 58 | Ht 69.0 in | Wt 183.0 lb

## 2018-12-01 DIAGNOSIS — I10 Essential (primary) hypertension: Secondary | ICD-10-CM | POA: Diagnosis not present

## 2018-12-01 DIAGNOSIS — Z720 Tobacco use: Secondary | ICD-10-CM

## 2018-12-01 DIAGNOSIS — I2510 Atherosclerotic heart disease of native coronary artery without angina pectoris: Secondary | ICD-10-CM | POA: Diagnosis not present

## 2018-12-01 DIAGNOSIS — E785 Hyperlipidemia, unspecified: Secondary | ICD-10-CM

## 2018-12-01 DIAGNOSIS — I480 Paroxysmal atrial fibrillation: Secondary | ICD-10-CM

## 2018-12-01 NOTE — Progress Notes (Signed)
Virtual Visit via Video Note   This visit type was conducted due to national recommendations for restrictions regarding the COVID-19 Pandemic (e.g. social distancing) in an effort to limit this patient's exposure and mitigate transmission in our community.  Due to his co-morbid illnesses, this patient is at least at moderate risk for complications without adequate follow up.  This format is felt to be most appropriate for this patient at this time.  All issues noted in this document were discussed and addressed.  A limited physical exam was performed with this format.  Please refer to the patient's chart for his consent to telehealth for [facility].   Date:  12/01/2018   ID:  Brian Brock, DOB 1946/03/07, MRN [id number]  Patient Location: Home Provider Location: Office  PCP:  [name], MD  Cardiologist:  Shelva Majestic, MD  Electrophysiologist:  None   Evaluation Performed:  Follow-Up Visit  Chief Complaint:  10 month F/U  History of Present Illness:    Brian Brock is a 73 y.o. male who has known CAD, hypertension, hyperlipidemia, hypothyroidism, peripheral vascular disease,probable metabolic syndrome and ongoing tobacco use. He has undergone prior percutaneous coronary interventions with stenting to his LAD and RCA  in 1997, 1998, and in 2004 to his proximal left circumflex coronary artery. He also has established PVD and underwent stenting to his left SFA in 2004 and right SFA in 2008. There is a history of tobacco use, hyperlipidemia and in April 2014 he presented to [facility] with chest pain and T wave abnormalities.  A nuclear perfusion study demonstrated reversible ischemia in the apical segment of the anterior wall. He underwent repeat cardiac catheterization on 09/07/2012 and was found to have preserved LV function with minimal focal region of mild inferior hypocontractility. He had significant progression of CAD with ostial 90% eccentric stenosis of the LAD  extending into the distal left main proximal to a previously placed proximal LAD stent. There also is 90% stenosis of the LAD after the stented segment. The circumflex vessel is 60% ostial stenosis followed by 60% proximal stenosis prior to the previously placed stent. The RCA stent was patent.  CABG surgery was recommended and this was done by Dr. Roxan Hockey on 09/12/2012 with an off-pump LIMA to LAD and SVG to circumflex marginal 1 vessel.  In 2014 [name] had rotator cuff surgery. He tolerated this well. He does have a history of peripheral vascular disease and is status post right common femoral and proximal SFA stenting with left SFA stenting. A followup  duplex study on 04/18/2013 demonstrated ABIs were excellent bilaterally at 1.1 with less than 49% diameter reduction noted in the right common iliac, right common femoral and SFA stent, and was less than 50% diameter reduction in the right distal SFA. There was 50-69% diameter reduction in the left external iliac artery and less than 49% and left common femoral. The left SFA stent was widely patent.  In February 2016 he developed an episode of increasing shortness of breath associated with diaphoresis.  Two weeks later in the cold weather he developed severe chest tightness with shortness of breath and blood pressure elevation to 664 systolically.  He was seen by [name] on 07/06/2014 after he had again developed some chest pain while moving 40 pounds of bags of salt that he had put in a wheelbarrow.  I reviewed his office note. A nuclear perfusion study which was done on 07/13/2014.  This was significantly changed from his prior  study and now was felt to be high risk with a large severe partially reversible anterior apical defect consistent with prior apical infarct, but now with evidence for severe ischemia in the distal anterior wall and apex.  There was apical hypokinesis.  Ejection fraction was 52%.  He was started on nitrates and  presented to my office in follow-up.  I recommended definitive cardiac catheterization and on 07/24/2014.  This was performed revealed multivessel native CAD was 70% ostial LAD stenosis, 40-50% stenosis in the proximal LAD stent in 1995% stenosis after the stented segment in the native LAD with 60% ostial septal perforating artery stenosis.  There was 20 and 30% proximal circumflex stenoses with a widely patent circumflex stent and had a widely patent proximal and mid RCA stents.  His LIMA graft was occluded in its midsegment prior to its anastomosis into the LAD.  He had widely patent vein graft supplying the distal circumflex marginal vessel.  He underwent successful PCI to the LAD at its ostium and in its midsegment treated with angiosculpt scoring balloon, insertion of a 3.034 mm Resolute DES stent extending from the ostium to the mid LAD, postdilated 3.41 mm with entire region been reduced to 0%.  In May 2017 he underwent peripheral angiography and stenting of his left external iliac artery.  He also had a 90% calcific plaque in the right common femoral artery and 90% in-stent restenosis with in his replaced right SFA stent.  He underwent right common femoral endarterectomy and patch angioplasty by Dr. Trula Slade in August 2017 which moderately improved his claudication and in September underwent staged right SFA diamondback orbital rotational atherectomy and drug-eluting balloon angioplasty of his in-stent restenosis within the mid right SFA  stent.  Subsequently, his claudication has improved.  A follow-up lower extremity Doppler study shows significantly improved ABIs at 0.93 on the right and 1.0 on the left  I  saw him in June 2018 at which time he denied any exertional chest pain or shortness of breath.  He was in need to undergo colonoscopy as well as endoscopy and was planning to undergo right inguinal hernia surgery.     He underwent a preoperative nuclear stress test in June 2018 which showed  normal systolic function without ECG changes and normal perfusion.  Compared to a prior study of 2016 the previous anteroapical defect had resolved.  When I last saw him in 2019 he had experienced 2 brief episodes of short-lived chest pain that were nonexertional  He admits at times he has burning in his legs.  He has continued to be on losartan 50 mg isosorbide 15 mg Bystolic 2.5 mg for blood pressure and his CAD.  He continued to be on dual antiplatelet therapy with aspirin and Plavix.  He was on rosuvastatin for hyperlipidemia.  He had undergone his hernia surgery and tolerated this well.  Since I last saw him, he developed right lower extremity swelling in January and was found to have a DVT.  With concomitant shortness of breath a CT demonstrated small bilateral nonocclusive pulmonary emboli.  His CT was negative for right heart strain.  He underwent a CT of his abdomen and pelvis on July 15, 2018 and he now has evidence for right inguinal hernia.  He has been evaluated by Dr. Tish Men and with his recent unprovoked right lower extremity DVT and bilateral PE is felt to require long-term anticoagulation therapy currently he is on Xarelto with plans for 20 mg daily for at least 6  months and then possible dose reduction to 10 mg if no evidence for worsening venous thromboembolic disease.  Over the past 4 to 6 weeks, he is finally quit smoking.  He states in the future who will be undergoing cataract surgery.  He also has had issues with bilateral hip claudication.  He presents for evaluation.  The patient does not have symptoms concerning for COVID-19 infection (fever, chills, cough, or new shortness of breath).    Past Medical History:  Diagnosis Date   AF (atrial fibrillation) (HCC)    happened after CABG, has not had it since   Allergic rhinitis    Allergy    Anginal pain ([place]) 1997   Arthritis    "lower back" (09/26/2015)   Childhood asthma    Chronic bronchitis (HCC)    Yearly    COPD (chronic obstructive pulmonary disease) ([place])    Coronary artery disease    Multiple stents   Dyslipidemia    Dysrhythmia    Femoral hernia of right side 12/28/2016   GERD (gastroesophageal reflux disease)    History of hiatal hernia    Hyperlipemia    Hypertension    Hypothyroidism    Irritable bowel syndrome    Myocardial infarct ([place]) 1997   "mild"   PE (pulmonary thromboembolism) ([place]) 05/2018   Peripheral vascular disease ([place])    prior stenting   Restless legs    Past Surgical History:  Procedure Laterality Date   CARDIAC CATHETERIZATION  2004   had remote tandem stents in 1997 and 1998 for LAD lesions, RCA stent in 1997 , midprto=ion, and a proximal RCA  SENT IN 1999, all DES; some were IVIS  guide.   CARDIAC CATHETERIZATION  09/07/2012   demonstrated severe 2- vessel disease,patent LAD,patient circ and patent right stents, but 90% ostial LAD whic was new.   CAROTID DOPPLER  100/24/11   RGT BULB and PROXIMAL ICA 0-49%; LFT. ICA  0-49 ; RGT SUBCLAVIAN ARTERY < 50% ; LFT SUBCLAVIAN normal patency; RGT  and LFT  VERTEBRAL ARTERIES   COLONOSCOPY  11/06/2016   Diverticulosis of colon. Internal hemorrhoids.    CORONARY ANGIOPLASTY     CORONARY ANGIOPLASTY WITH STENT PLACEMENT  '97, '98, '04, '06, '08, '10   seven cardiac stents   CORONARY ARTERY BYPASS GRAFT N/A 09/12/2012   Procedure: OFF PUMP CORONARY ARTERY BYPASS GRAFTING (CABG);  Surgeon: Melrose Nakayama, MD;  Location: [place];  Service: Open Heart Surgery;  Laterality: N/A;  Times two using left internal mammary artery and endoscopically harvested right saphenous vein  ;LIMA-LAD and SVG -OM1   DOPPLER ECHOCARDIOGRAPHY  11/23/2012,09/30/2010    LV EF 50-55%----09/30/2010-LV EF = >55%   ENDARTERECTOMY FEMORAL Right 12/19/2015   Procedure: ENDARTERECTOMY RIGHT FEMORAL ARTERY;  Surgeon: Serafina Mitchell, MD;  Location: [place] [facility];  Service: Vascular;  Laterality: Right;   event monitor   11/04/2012-11/17/2012   pt denies this hx on 07/24/2014   FEMORAL ARTERY STENT Bilateral 7/06  8/08   Lt SFA '06, Rt SFA 8/08   HERNIA REPAIR  10/2008   "repaired w/gallbladder OR"   HERNIA REPAIR  07/2016   INGUINAL HERNIA REPAIR Right 12/28/2016   Procedure: OPEN REPAIR RIGHT GROIN HERNIA WITH MESH;  Surgeon: Fanny Skates, MD;  Location: [place];  Service: General;  Laterality: Right;   LAPAROSCOPIC CHOLECYSTECTOMY  June 2010   LEFT HEART CATHETERIZATION WITH CORONARY ANGIOGRAM N/A 09/07/2012   Procedure: LEFT HEART CATHETERIZATION WITH CORONARY ANGIOGRAM;  Surgeon: Troy Sine,  MD;  Location: [place] CATH [facility];  Service: Cardiovascular;  Laterality: N/A;   LEFT HEART CATHETERIZATION WITH CORONARY/GRAFT ANGIOGRAM N/A 07/24/2014   Procedure: LEFT HEART CATHETERIZATION WITH Beatrix Fetters;  Surgeon: Troy Sine, MD; LAD 70%, 50% ISR, mLAD 99%, CFX 30%, patent stent, RCA patent, LIMA-LAD occluded, SVG-OM 1 patent, EF 50-55%   LEXISCAN STRESS TEST  09/06/2012   LOWER ARTERIAL DOPPLER  03/25/2012   FGT CIA 0-49%; RGT CFA AND SFA AT STENT 0-49%; RGT DISTAL SFA = <50%; LFT. EIA  50-69%; LFT. CFA 0-49% ;;LFT. SFA  AT STENT NORMAL PATENCY   NM MYOVIEW LTD  05/06/2012   EF -STUDY NOT GATED, NORMAL STRESS NUCLEAR ,NORMAL LV FUNCTION   PATCH ANGIOPLASTY Right 12/19/2015   Procedure: PATCH ANGIOPLASTY RIGHT FEMORAL ARTERY;  Surgeon: Serafina Mitchell, MD;  Location: [place];  Service: Vascular;  Laterality: Right;   PERCUTANEOUS CORONARY STENT INTERVENTION (PCI-S) Right 07/24/2014   Procedure: PERCUTANEOUS CORONARY STENT INTERVENTION (PCI-S);  Surgeon: Troy Sine, MD; ostial LAD PTCA with angioscope scoring balloon, 3.0 x 34 mm Resolute DES extending from the ostium to the mid LAD, covering all significant stenoses    PERIPHERAL VASCULAR CATHETERIZATION N/A 09/26/2015   Procedure: Abdominal Aortogram w/Lower Extremity;  Surgeon: Lorretta Harp, MD;  Location: [place] [facility];  Service:  Cardiovascular;  Laterality: N/A;   PERIPHERAL VASCULAR CATHETERIZATION Right 02/20/2016   Procedure: Peripheral Vascular Atherectomy;  Surgeon: Lorretta Harp, MD;  Location: [place] [facility];  Service: Cardiovascular;  Laterality: Right;  SFA   PERIPHERAL VASCULAR CATHETERIZATION Right 02/20/2016   Procedure: Peripheral Vascular Balloon Angioplasty;  Surgeon: Lorretta Harp, MD;  Location: [place] [facility];  Service: Cardiovascular;  Laterality: Right;  SFA   TONSILLECTOMY  1950's   UPPER GASTROINTESTINAL ENDOSCOPY       Current Meds  Medication Sig   albuterol (PROAIR HFA) 108 (90 Base) MCG/ACT inhaler Inhale 2 puffs every 6 hours as needed (Patient taking differently: Inhale 2 puffs into the lungs every 6 (six) hours as needed for wheezing or shortness of breath. )   aspirin EC 81 MG tablet Take 81 mg by mouth daily.    BYSTOLIC 5 MG tablet TAKE 1/2 TABLET ONCE DAILY (Patient taking differently: Take 2.5 mg by mouth daily. )   cetirizine (ZYRTEC) 10 MG tablet Take 10 mg by mouth daily.    cholestyramine (QUESTRAN) 4 g packet Take 0.5 packets (2 g total) by mouth 2 (two) times daily with a meal.   cyclobenzaprine (FLEXERIL) 10 MG tablet Take 10 mg by mouth 3 (three) times daily as needed for muscle spasms.    isosorbide mononitrate (IMDUR) 30 MG 24 hr tablet TAKE 1/2 TABLET BY MOUTH EVERY DAY (Patient taking differently: Take 15 mg by mouth daily. )   levothyroxine (SYNTHROID, LEVOTHROID) 25 MCG tablet Take 25 mcg by mouth daily before breakfast.    loperamide (IMODIUM A-D) 2 MG tablet Take 2 mg by mouth 4 (four) times daily as needed for diarrhea or loose stools.   losartan (COZAAR) 50 MG tablet Take 1 tablet (50 mg total) by mouth daily.   nitroGLYCERIN (NITROSTAT) 0.4 MG SL tablet PLACE 1 TABLET UNDER THE TONGUE, MAY REPEAT EVERY 5 MINUTES MAX 3 TABLETS   pantoprazole (PROTONIX) 40 MG tablet Take 1 tablet (40 mg total) by mouth 2 (two) times daily. (Patient taking  differently: Take 40 mg by mouth at bedtime. )   Probiotic Product (PROBIOTIC PO) Take 1 capsule by  mouth daily.    rivaroxaban (XARELTO) 20 MG TABS tablet Take 1 tablet (20 mg total) by mouth daily with supper.   rosuvastatin (CRESTOR) 20 MG tablet TAKE ONE TABLET BY MOUTH EVERY DAY (Patient taking differently: Take 20 mg by mouth daily. )   triamcinolone cream (KENALOG) 0.1 % Apply 1 application topically as needed (for itching).    zolpidem (AMBIEN) 10 MG tablet Take 5-10 mg by mouth at bedtime.      Allergies:   Other, Clarithromycin, Fenofibrate micronized, Metoprolol, Tricor [fenofibrate], Fish oil, Levofloxacin, Meloxicam, Metaxalone, Niaspan [niacin], and Sulfadiazine   Social History   Tobacco Use   Smoking status: Current Every Day Smoker    Packs/day: 0.50    Years: 56.00    Pack years: 28.00    Types: Cigarettes    Last attempt to quit: 03/25/2016    Years since quitting: 2.6   Smokeless tobacco: Former Systems developer    Types: Chew  Substance Use Topics   Alcohol use: Yes    Alcohol/week: 1.0 standard drinks    Types: 1 Standard drinks or equivalent per week    Comment: 09/26/2015 "varies; might have a few drinks q 2 months"   Drug use: No     Family Hx: The patient's family history includes Coronary artery disease (age of onset: 74) in his father; Coronary artery disease (age of onset: 83) in his brother; Deep vein thrombosis in his brother; Heart attack in his father. There is no history of Colon cancer, Esophageal cancer, Rectal cancer, or Stomach cancer.  ROS:   Please see the history of present illness.    No fevers night sweats, No cough or wheezing No exertional anginal symptoms Admits to very short-lived nonexertional mild chest sensation that intermittently occurs unrelated to activity Bilateral hip discomfort with walking and he was told resulting from poor circulation Right inguinal hernia Quit tobacco Sleeping well All other systems reviewed and are  negative.   Prior CV studies:   The following studies were reviewed today:  I reviewed the patient's recent lower extremity Doppler studies, CT imaging and office records.  Labs/Other Tests and Data Reviewed:    EKG:  An ECG dated 06/08/2018 was personally reviewed today and demonstrated:  Sinus bradycardia at 50 bpm  Recent Labs: 02/07/2018: TSH 2.410 10/04/2018: ALT 15; BUN 20; Creatinine 1.07; Hemoglobin 14.9; Platelet Count 229; Potassium 4.1; Sodium 140   Recent Lipid Panel Lab Results  Component Value Date/Time   CHOL 141 02/07/2018 09:15 AM   TRIG 148 02/07/2018 09:15 AM   HDL 48 02/07/2018 09:15 AM   CHOLHDL 2.9 02/07/2018 09:15 AM   CHOLHDL 3.0 09/06/2012 04:55 AM   LDLCALC 63 02/07/2018 09:15 AM    Wt Readings from Last 3 Encounters:  12/01/18 183 lb (83 kg)  10/04/18 185 lb 6.4 oz (84.1 kg)  07/05/18 190 lb (86.2 kg)     Objective:    Vital Signs:  BP 116/71    Pulse (!) 58    Ht 5\' 9"  (1.753 m)    Wt 183 lb (83 kg)    BMI 27.02 kg/m     Vital signs reviewed and stable HEENT unremarkable Breathing unlabored No audible wheezing No chest pain to palpation Complains of hip discomfort with walking No edema No leg swelling Normal affect and mood   ASSESSMENT & PLAN:    1. CAD: Status post prior stenting to the proximal LAD, circumflex as well as RCA with ultimate CABG revascularization surgery April 2014 by  Dr. Roxan Hockey.  Subsequent catheterization in 2014 showed an occluded LIMA graft and high-grade section ostial stenosis with 95% in-state restenosis of the previously placed stent.  Treated successfully with Angiosculpt and insertion of 3.0 x 34 mm Resolute DES stent.  No exertional anginal symptoms.  Last nuclear study showed normal perfusion with EF 58% with improvement in prior previous anterior and apical defects. 2. Essential hypertension: BP stable on current medical regimen of Bystolic, low-dose isosorbide, and losartan. 3. Recent DVT/PE: Currently  on Xarelto and baby aspirin followed by Dr. Wandra Feinstein 4. PVD: Status post multiple interventions: Recent ABIs fairly normal.  Patient complains of bilateral hip claudication symptoms.  Recent ultrasound shows less than 50% stenoses in the right common iliac, left common iliac and right external iliac but with 50 to 99% stenosis in the left external iliac. Will asked Dr. Gwenlyn Found to see patient for further evaluation and assessment 5. Recurrent inguinal hernia: Will ultimately require redo surgery. 6. Hyperlipidemia with target LDL less than 70: Currently on rosuvastatin 20 mg.  Most recent LDL 63 in September 2020    COVID-19 Education: The signs and symptoms of COVID-19 were discussed with the patient and how to seek care for testing (follow up with PCP or arrange E-visit).  The importance of social distancing was discussed today.  Time:   Today, I have spent 27 minutes with the patient with telehealth technology discussing the above problems.     Medication Adjustments/Labs and Tests Ordered: Current medicines are reviewed at length with the patient today.  Concerns regarding medicines are outlined above.   Tests Ordered: No orders of the defined types were placed in this encounter.   Medication Changes: No orders of the defined types were placed in this encounter.   Follow Up:  6 months Signed, Shelva Majestic, MD  12/01/2018 3:07 PM    [facility]

## 2018-12-01 NOTE — Patient Instructions (Addendum)
Medication Instructions:  The current medical regimen is effective;  continue present plan and medications.  If you need a refill on your cardiac medications before your next appointment, please call your pharmacy.   Follow-Up: At [facility], you and your health needs are our priority.  As part of our continuing mission to provide you with exceptional heart care, we have created designated Provider Care Teams.  These Care Teams include your primary Cardiologist (physician) and Advanced Practice Providers (APPs -  Physician Assistants and Nurse Practitioners) who all work together to provide you with the care you need, when you need it. . We will set up appointment to see Dr.Berry in 1-2 months. . Follow up with either Dr.Kelly or Kerin Ransom, PA in 6 months.

## 2018-12-09 ENCOUNTER — Telehealth: Payer: Self-pay

## 2018-12-09 NOTE — Telephone Encounter (Signed)
Follow Up:     Caryl Pina, NP from Dr Colan Neptune office called. She wanted you to know that they did not need to stop any blood thinners forpt's surgery, they just need medical clearance.

## 2018-12-09 NOTE — Telephone Encounter (Signed)
   Primary Cardiologist: Shelva Majestic, MD  Chart reviewed as part of pre-operative protocol coverage. Cataract extractions are recognized in guidelines as low risk surgeries that do not typically require specific preoperative testing or holding of blood thinner therapy. Therefore, given past medical history and time since last visit, based on ACC/AHA guidelines, Brian Brock would be at acceptable risk for the planned procedure without further cardiovascular testing.   I will route this recommendation to the requesting party via Epic fax function and remove from pre-op pool.  Please call with questions.  Rosaria Ferries, PA-C 12/09/2018, 3:08 PM  [place]   office phone number  [phone] office fax number [phone]

## 2018-12-09 NOTE — Telephone Encounter (Signed)
   [facility] Pre-operative Risk Assessment    Request for surgical clearance:  1. What type of surgery is being performed? Cataract Surgery     2. When is this surgery scheduled? 12/14/18   3. What type of clearance is required (medical clearance vs. Pharmacy clearance to hold med vs. Both)? Medical  4. Are there any medications that need to be held prior to surgery and how long? Not Specified    5. Practice name and name of physician performing surgery?  [facility]  6. What is your office phone number  [phone]   7.   What is your office fax number [phone]  8.   Anesthesia type (None, local, MAC, general) ?  IV sedation    Meryl Crutch 12/09/2018, 8:13 AM  _________________________________________________________________   (provider comments below)

## 2018-12-27 ENCOUNTER — Telehealth: Payer: Self-pay | Admitting: Cardiovascular Disease

## 2018-12-27 NOTE — Telephone Encounter (Signed)
Returned call to pt he states that he has been feeling fatigued and not feeling like doing anything, his BP has been running  96/58 - 122/83. When his BP is in the 90's is when he is feeling fatigued. Denies any other sx, he is taking his medications as directed, Bystolic 5mg , losartan 50mg  and all other medications as listed in chart. Discussing this with pt he has been working out in the yard all day and drinking soda and tea mostly with very little water and states that he thinks that his fluid intake has been less than normal. He will increase his fluid intake to just water and stop the soda and decrease tea. He will change to at least 3/4 gal of water daily. He will continue to log BP and make note of water intake. He will Cb if BP does not "gp back to normal" verbalizes understanding to call ASAP if BP <85 systolic.

## 2018-12-27 NOTE — Telephone Encounter (Signed)
New Message     Pt c/o BP issue: STAT if pt c/o blurred vision, one-sided weakness or slurred speech  1. What are your last 5 BP readings? 119/61, 96/62, 97/58, 120/63, and 105/65  2. Are you having any other symptoms (ex. Dizziness, headache, blurred vision, passed out)? Fatigue  3. What is your BP issue? Patient's BP dropped to 96/58 and it's usually 122/78 and he would like to speak to nurse patient has no energy when it drops.

## 2019-01-05 ENCOUNTER — Other Ambulatory Visit: Payer: Self-pay

## 2019-01-05 ENCOUNTER — Inpatient Hospital Stay (HOSPITAL_BASED_OUTPATIENT_CLINIC_OR_DEPARTMENT_OTHER): Payer: Medicare Other | Admitting: Hematology

## 2019-01-05 ENCOUNTER — Telehealth: Payer: Self-pay | Admitting: Hematology

## 2019-01-05 ENCOUNTER — Encounter: Payer: Self-pay | Admitting: Hematology

## 2019-01-05 ENCOUNTER — Inpatient Hospital Stay: Payer: Medicare Other | Attending: Hematology

## 2019-01-05 VITALS — BP 137/58 | HR 48 | Temp 96.4°F | Resp 19 | Ht 69.0 in | Wt 189.1 lb

## 2019-01-05 DIAGNOSIS — Z720 Tobacco use: Secondary | ICD-10-CM

## 2019-01-05 DIAGNOSIS — R5383 Other fatigue: Secondary | ICD-10-CM

## 2019-01-05 DIAGNOSIS — I824Y1 Acute embolism and thrombosis of unspecified deep veins of right proximal lower extremity: Secondary | ICD-10-CM | POA: Diagnosis not present

## 2019-01-05 DIAGNOSIS — I2699 Other pulmonary embolism without acute cor pulmonale: Secondary | ICD-10-CM

## 2019-01-05 DIAGNOSIS — Z7901 Long term (current) use of anticoagulants: Secondary | ICD-10-CM | POA: Diagnosis not present

## 2019-01-05 DIAGNOSIS — F17210 Nicotine dependence, cigarettes, uncomplicated: Secondary | ICD-10-CM | POA: Diagnosis not present

## 2019-01-05 DIAGNOSIS — Z86718 Personal history of other venous thrombosis and embolism: Secondary | ICD-10-CM | POA: Insufficient documentation

## 2019-01-05 LAB — CMP (CANCER CENTER ONLY)
ALT: 17 U/L (ref 0–44)
AST: 19 U/L (ref 15–41)
Albumin: 3.6 g/dL (ref 3.5–5.0)
Alkaline Phosphatase: 78 U/L (ref 38–126)
Anion gap: 6 (ref 5–15)
BUN: 16 mg/dL (ref 8–23)
CO2: 29 mmol/L (ref 22–32)
Calcium: 8.4 mg/dL — ABNORMAL LOW (ref 8.9–10.3)
Chloride: 106 mmol/L (ref 98–111)
Creatinine: 1.09 mg/dL (ref 0.61–1.24)
GFR, Est AFR Am: >60 mL/min (ref >60)
GFR, Estimated: >60 mL/min (ref >60)
Glucose, Bld: 124 mg/dL — ABNORMAL HIGH (ref 70–99)
Potassium: 3.7 mmol/L (ref 3.5–5.1)
Sodium: 141 mmol/L (ref 135–145)
Total Bilirubin: 0.5 mg/dL (ref 0.3–1.2)
Total Protein: 5.7 g/dL — ABNORMAL LOW (ref 6.5–8.1)

## 2019-01-05 LAB — CBC WITH DIFFERENTIAL (CANCER CENTER ONLY)
Abs Immature Granulocytes: 0.03 10*3/uL (ref 0.00–0.07)
Basophils Absolute: 0.1 10*3/uL (ref 0.0–0.1)
Basophils Relative: 1 %
Eosinophils Absolute: 0.2 10*3/uL (ref 0.0–0.5)
Eosinophils Relative: 3 %
HCT: 42.7 % (ref 39.0–52.0)
Hemoglobin: 14 g/dL (ref 13.0–17.0)
Immature Granulocytes: 0 %
Lymphocytes Relative: 24 %
Lymphs Abs: 2.1 10*3/uL (ref 0.7–4.0)
MCH: 28.7 pg (ref 26.0–34.0)
MCHC: 32.8 g/dL (ref 30.0–36.0)
MCV: 87.7 fL (ref 80.0–100.0)
Monocytes Absolute: 0.7 10*3/uL (ref 0.1–1.0)
Monocytes Relative: 8 %
Neutro Abs: 5.7 10*3/uL (ref 1.7–7.7)
Neutrophils Relative %: 64 %
Platelet Count: 212 10*3/uL (ref 150–400)
RBC: 4.87 MIL/uL (ref 4.22–5.81)
RDW: 13.6 % (ref 11.5–15.5)
WBC Count: 8.8 10*3/uL (ref 4.0–10.5)
nRBC: 0 % (ref 0.0–0.2)

## 2019-01-05 LAB — D-DIMER, QUANTITATIVE: D-Dimer, Quant: 0.37 ug/mL-FEU (ref 0.00–0.50)

## 2019-01-05 NOTE — Progress Notes (Signed)
[place] OFFICE PROGRESS NOTE  Patient Care Team: [name], MD as PCP - General (Family Medicine) Troy Sine, MD as PCP - Cardiology (Cardiology) Lorretta Harp, MD as Consulting Physician (Cardiology) Troy Sine, MD as Consulting Physician (Cardiology) Deneise Lever, MD as Consulting Physician (Pulmonary Disease)  HEME/ONC OVERVIEW: 1. Unprovoked RLE DVT and bilateral PTE  -05/2018: doppler showed extensive acute DVT involving the R femoral, popliteal, posterior tibial and peroneal vein; CTA chest showed small bilateral PTE without evidence of RV strain; no malignancy on scans  -05/2018 - present: Xarelto  TREATMENT REGIMEN:  05/24/2018 - present: Xarelto  ASSESSMENT & PLAN:   Unprovoked RLE DVT and bilateral PTE  -Currently on Xarelto 20 mg daily without any significant bleeding or bruising -No evidence for lower extremity swelling or other symptoms to suggest recurrent VTE today -Goal of anticoagulation is lifelong, given unprovoked DVT and PTE -We discussed today about continuing full-dose anticoagulation versus reducing the dose by half for secondary prophylaxis after at least 6 months of full dose anticoagulation -In light of his extensive lower extremity DVT, I am less inclined to reduce the dose by half due to the increased risk of recurrent VTE  -Continue Xareltol 20mg  daily for now  -I reinforced the importance of preventive strategies such as avoiding hormonal supplement, avoiding cigarette smoking, keeping up-to-date with screening programs for early cancer detection, frequent ambulation for long distance travel and aggressive DVT prophylaxis in all surgical settings. -Should he need any interruption of the anticoagulation for elective procedures in the future, feel free to contact me regarding peri-operative management.  Fatigue -Likely multifactorial, including dehydration from strenuous activities -I counseled patient on the  importance of maintaining adequate hydration -Furthermore, patient has been checking his blood pressure periodically throughout the day and has had some borderline low readings -I counseled him on the importance of checking his blood pressure at least twice a day (morning and then at night), and if he has persistent low BP readings, he should contact his PCP for adjustment of his blood pressure medications as indicated  Tobacco cessation -Patient still currently smoking approximately half a pack per day  -I counseled the patient again today regarding the importance of smoking cessation -Patient expressed understanding, and will consider contacting his primary care physician for further pharmacologic interventions  Orders Placed This Encounter  Procedures  . CBC with Differential (Cancer Center Only)    Standing Status:   Future    Standing Expiration Date:   02/09/2020  . CMP ([place] only)    Standing Status:   Future    Standing Expiration Date:   02/09/2020  . D-dimer, quantitative (not at [facility])    Standing Status:   Future    Standing Expiration Date:   02/09/2020   All questions were answered. The patient knows to call the clinic with any problems, questions or concerns. No barriers to learning was detected.  A total of more than 25 minutes were spent face-to-face with the patient during this encounter and over half of that time was spent on counseling and coordination of care as outlined above.   Return in 6 months for labs and clinic follow-up.  Tish Men, MD 01/05/2019 10:24 AM  CHIEF COMPLAINT: "I am just tired all the time"  INTERVAL HISTORY: Brian Brock returns to clinic for follow-up of history of unprovoked DVT and bilateral PTE.  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  He works up to 10-hour days and feels very fatigued at the end of the day.  He has been checking his blood pressure periodically throughout the  day and has had some borderline low readings (systolic BP in the 82'U), but he denies any chest pain, dyspnea, diaphoresis, lightheadedness, or syncope.  He still currently smoking approximately half a pack of cigarettes per day.  He denies any abnormal bleeding or bruising on Xarelto.  REVIEW OF SYSTEMS:   Constitutional: ( - ) fevers, ( - )  chills , ( - ) night sweats Eyes: ( - ) blurriness of vision, ( - ) double vision, ( - ) watery eyes Ears, nose, mouth, throat, and face: ( - ) mucositis, ( - ) sore throat Respiratory: ( - ) cough, ( - ) dyspnea, ( - ) wheezes Cardiovascular: ( - ) palpitation, ( - ) chest discomfort, ( - ) lower extremity swelling Gastrointestinal:  ( - ) nausea, ( - ) heartburn, ( - ) change in bowel habits Skin: ( - ) abnormal skin rashes Lymphatics: ( - ) new lymphadenopathy, ( - ) easy bruising Neurological: ( - ) numbness, ( - ) tingling, ( - ) new weaknesses Behavioral/Psych: ( - ) mood change, ( - ) new changes  All other systems were reviewed with the patient and are negative.  I have reviewed the past medical history, past surgical history, social history and family history with the patient and they are unchanged from previous note.  ALLERGIES:  is allergic to other; clarithromycin; fenofibrate micronized; metoprolol; tricor [fenofibrate]; fish oil; levofloxacin; meloxicam; metaxalone; niaspan [niacin]; and sulfadiazine.  MEDICATIONS:  Current Outpatient Medications  Medication Sig Dispense Refill  . albuterol (PROAIR HFA) 108 (90 Base) MCG/ACT inhaler Inhale 2 puffs every 6 hours as needed (Patient taking differently: Inhale 2 puffs into the lungs every 6 (six) hours as needed for wheezing or shortness of breath. ) 1 Inhaler 12  . aspirin EC 81 MG tablet Take 81 mg by mouth daily.     [name] BYSTOLIC 5 MG tablet TAKE 1/2 TABLET ONCE DAILY (Patient taking differently: Take 2.5 mg by mouth daily. ) 30 tablet 6  . cetirizine (ZYRTEC) 10 MG tablet Take 10 mg by  mouth daily.     . cholestyramine (QUESTRAN) 4 g packet Take 0.5 packets (2 g total) by mouth 2 (two) times daily with a meal. 60 each 12  . cyclobenzaprine (FLEXERIL) 10 MG tablet Take 10 mg by mouth 3 (three) times daily as needed for muscle spasms.     . isosorbide mononitrate (IMDUR) 30 MG 24 hr tablet TAKE 1/2 TABLET BY MOUTH EVERY DAY (Patient taking differently: Take 15 mg by mouth daily. ) 45 tablet 2  . levothyroxine (SYNTHROID, LEVOTHROID) 25 MCG tablet Take 25 mcg by mouth daily before breakfast.     . loperamide (IMODIUM A-D) 2 MG tablet Take 2 mg by mouth 4 (four) times daily as needed for diarrhea or loose stools.    [name] losartan (COZAAR) 50 MG tablet Take 1 tablet (50 mg total) by mouth daily. 30 tablet 0  . nitroGLYCERIN (NITROSTAT) 0.4 MG SL tablet PLACE 1 TABLET UNDER THE TONGUE, MAY REPEAT EVERY 5 MINUTES MAX 3 TABLETS 25 tablet 11  . pantoprazole (PROTONIX) 40 MG tablet Take 1 tablet (40 mg total) by mouth 2 (two) times daily. (Patient taking differently: Take 40 mg by mouth at bedtime. ) 60 tablet 0  . Probiotic Product (PROBIOTIC  PO) Take 1 capsule by mouth daily.     . rivaroxaban (XARELTO) 20 MG TABS tablet Take 1 tablet (20 mg total) by mouth daily with supper. 90 tablet 1  . rosuvastatin (CRESTOR) 20 MG tablet TAKE ONE TABLET BY MOUTH EVERY DAY (Patient taking differently: Take 20 mg by mouth daily. ) 90 tablet 3  . triamcinolone cream (KENALOG) 0.1 % Apply 1 application topically as needed (for itching).     . zolpidem (AMBIEN) 10 MG tablet Take 5-10 mg by mouth at bedtime.      No current facility-administered medications for this visit.     PHYSICAL EXAMINATION: ECOG PERFORMANCE STATUS: 1 - Symptomatic but completely ambulatory  Today's Vitals   01/05/19 1010  BP: (!) 137/58  Pulse: (!) 48  Resp: 19  Temp: (!) 96.4 F (35.8 C)  TempSrc: Temporal  SpO2: 100%  Weight: 189 lb 1.3 oz (85.8 kg)  Height: 5\' 9"  (1.753 m)  PainSc: 0-No pain   Body mass index is  27.92 kg/m.  Filed Weights   01/05/19 1010  Weight: 189 lb 1.3 oz (85.8 kg)    GENERAL: alert, no distress and comfortable SKIN: skin color, texture, turgor are normal, no rashes or significant lesions EYES: conjunctiva are pink and non-injected, sclera clear NECK: supple, non-tender LUNGS: clear to auscultation with normal breathing effort HEART: regular rate & rhythm and no murmurs and no lower extremity edema ABDOMEN: soft, non-tender, non-distended, normal bowel sounds Musculoskeletal: no cyanosis of digits and no clubbing  PSYCH: alert & oriented x 3, fluent speech NEURO: no focal motor/sensory deficits  LABORATORY DATA:  I have reviewed the data as listed    Component Value Date/Time   NA 141 01/05/2019 0922   NA 142 02/07/2018 0915   K 3.7 01/05/2019 0922   CL 106 01/05/2019 0922   CO2 29 01/05/2019 0922   GLUCOSE 124 (H) 01/05/2019 0922   BUN 16 01/05/2019 0922   BUN 18 02/07/2018 0915   CREATININE 1.09 01/05/2019 0922   CREATININE 1.05 02/12/2016 1008   CALCIUM 8.4 (L) 01/05/2019 0922   PROT 5.7 (L) 01/05/2019 0922   PROT 6.0 02/07/2018 0915   ALBUMIN 3.6 01/05/2019 0922   ALBUMIN 3.9 02/07/2018 0915   AST 19 01/05/2019 0922   ALT 17 01/05/2019 0922   ALKPHOS 78 01/05/2019 0922   BILITOT 0.5 01/05/2019 0922   GFRNONAA >60 01/05/2019 0922   GFRAA >60 01/05/2019 0922    No results found for: SPEP, UPEP  Lab Results  Component Value Date   WBC 8.8 01/05/2019   NEUTROABS 5.7 01/05/2019   HGB 14.0 01/05/2019   HCT 42.7 01/05/2019   MCV 87.7 01/05/2019   PLT 212 01/05/2019      Chemistry      Component Value Date/Time   NA 141 01/05/2019 0922   NA 142 02/07/2018 0915   K 3.7 01/05/2019 0922   CL 106 01/05/2019 0922   CO2 29 01/05/2019 0922   BUN 16 01/05/2019 0922   BUN 18 02/07/2018 0915   CREATININE 1.09 01/05/2019 0922   CREATININE 1.05 02/12/2016 1008      Component Value Date/Time   CALCIUM 8.4 (L) 01/05/2019 0922   ALKPHOS 78 01/05/2019  0922   AST 19 01/05/2019 0922   ALT 17 01/05/2019 0922   BILITOT 0.5 01/05/2019 0626

## 2019-01-05 NOTE — Telephone Encounter (Signed)
Called and spoke with patient regarding appointments added per 8/20 los

## 2019-02-09 ENCOUNTER — Encounter: Payer: Self-pay | Admitting: Internal Medicine

## 2019-02-09 ENCOUNTER — Ambulatory Visit (INDEPENDENT_AMBULATORY_CARE_PROVIDER_SITE_OTHER): Payer: Medicare Other | Admitting: Internal Medicine

## 2019-02-09 ENCOUNTER — Other Ambulatory Visit: Payer: Self-pay

## 2019-02-09 DIAGNOSIS — J449 Chronic obstructive pulmonary disease, unspecified: Secondary | ICD-10-CM

## 2019-02-09 DIAGNOSIS — I2510 Atherosclerotic heart disease of native coronary artery without angina pectoris: Secondary | ICD-10-CM | POA: Diagnosis not present

## 2019-02-09 DIAGNOSIS — I2699 Other pulmonary embolism without acute cor pulmonale: Secondary | ICD-10-CM

## 2019-02-09 DIAGNOSIS — Z23 Encounter for immunization: Secondary | ICD-10-CM

## 2019-02-09 DIAGNOSIS — Z720 Tobacco use: Secondary | ICD-10-CM

## 2019-02-09 MED ORDER — ALBUTEROL SULFATE (2.5 MG/3ML) 0.083% IN NEBU: 2.5000 mg | INHALATION_SOLUTION | Freq: Four times a day (QID) | RESPIRATORY_TRACT | 12 refills | Status: DC | PRN

## 2019-02-09 MED ORDER — COMPRESSOR/NEBULIZER MISC: 0 refills | Status: DC

## 2019-02-09 NOTE — Progress Notes (Signed)
HPI  M former smoker (30 pk yrs) followed for  for asthma/ bronchitis complicated by CAD/ CABG, PAD, GERD, PE 05/24/2018, PFT 09/08/12 --------------------------------------------------------------------------------------   09/10/17- 73 year old male smoker (55 pack years) followed for asthma/bronchitis, complicated by CAD/CABG, PAD, PAFib, GERD ----Bronchitis: Pt has flu about 2 weeks ago; states he has SOB at times.  Feels fully recovered from flu like illness.  Using a Proair inhaler occasionally.  Had been using Dulera but even with spacer tube could not prevent thrush. Still smokes few cigarettes daily despite extensive counseling.  Not motivated to change. Occasional cough with scant white sputum.  Denies blood or acute events.  02/09/2019- 73 year old male smoker (55 pack years) followed for asthma/br- onchitis, complicated by CAD/CABG, PAD, PAFib, GERD, PE/ DVT 05/2018/ Xarelto, Albuterol hfa,  Unprovoked RLE DVT and bilateral PE 05/24/2018. Xarelto. Managed by [facility]. -----pt states his breathing is at baseline, is not currently taking any breathing medications Every night as he lies down, he is aware of some chest congestion, can cough scant white. No blood. Discussed having a nebulizer machine. Discussed smoking cessation again.  CTa chest 05/24/2018- Small bilateral nonocclusive pulmonary emboli. Negative for evidence of right heart strain. Cardiomegaly.  ROS-see HPI + = positive Constitutional:   No-   weight loss, night sweats, fevers, chills, fatigue, lassitude. HEENT:   No-  headaches, difficulty swallowing, tooth/dental problems, sore throat,       No-  sneezing, itching, ear ache, nasal congestion, post nasal drip,  CV:  No-   chest pain, orthopnea, PND, swelling in lower extremities, anasarca, dizziness, palpitations Resp: +-shortness of breath with exertion or at rest.              +   productive cough,  + non-productive cough,  No- coughing up of blood.         No-   change in color of mucus.  No- wheezing.   Skin: No-   rash or lesions. GI:  No-   heartburn, indigestion, abdominal pain, nausea, vomiting,  GU:  MS:  No-   joint pain or swelling.  . Neuro-     nothing unusual Psych:  No- change in mood or affect. No depression or anxiety.  No memory loss.  OBJ- Physical Exam General- Alert, Oriented, Affect-appropriate, Distress- none acute Skin- rash-none, lesions- none, excoriation- none Lymphadenopathy- none Head- atraumatic            Eyes- Gross vision intact, PERRLA, conjunctivae and secretions clear            Ears- Hearing, canals-normal            Nose- Clear, no-Septal dev, mucus, polyps, erosion, perforation             Throat- Mallampati II , mucosa clear , drainage- none, tonsils- atrophic, dentures Neck- flexible , trachea midline, no stridor , thyroid nl, carotid no bruit Chest - symmetrical excursion , unlabored           Heart/CV- RRR occasional extra beat , no murmur , no gallop  , no rub, nl s1 s2                           - JVD- none , edema- none, stasis changes- none, varices- none           Lung- clear, wheeze- none, cough- none , dullness-none, rub- none           Chest wall-  Abd-  Br/ Gen/ Rectal- Not done, not indicated Extrem- cyanosis- none, clubbing, none, atrophy- none, strength- nl Neuro- grossly intact to observation

## 2019-02-09 NOTE — Patient Instructions (Signed)
Order- flu vax senior  Order- DME for nebulizer machine and albuterol neb solution-scripts are printed  Please stop smoking while you can !!

## 2019-02-11 NOTE — Assessment & Plan Note (Signed)
Importance of complete cessation re-emphasized. He is not yet sufficiently motivated.

## 2019-02-11 NOTE — Assessment & Plan Note (Signed)
Discussed use of a nebulizer machine, availability of rescue inhaler. Flu vax.

## 2019-02-11 NOTE — Assessment & Plan Note (Signed)
Now on Xarelto, managed by [facility].

## 2019-02-21 ENCOUNTER — Ambulatory Visit (INDEPENDENT_AMBULATORY_CARE_PROVIDER_SITE_OTHER): Payer: Medicare Other | Admitting: Cardiovascular Disease

## 2019-02-21 ENCOUNTER — Encounter: Payer: Self-pay | Admitting: Cardiovascular Disease

## 2019-02-21 ENCOUNTER — Other Ambulatory Visit: Payer: Self-pay

## 2019-02-21 VITALS — BP 151/67 | HR 56 | Ht 69.0 in | Wt 188.2 lb

## 2019-02-21 DIAGNOSIS — I2510 Atherosclerotic heart disease of native coronary artery without angina pectoris: Secondary | ICD-10-CM

## 2019-02-21 DIAGNOSIS — I10 Essential (primary) hypertension: Secondary | ICD-10-CM | POA: Diagnosis not present

## 2019-02-21 DIAGNOSIS — I739 Peripheral vascular disease, unspecified: Secondary | ICD-10-CM | POA: Diagnosis not present

## 2019-02-21 NOTE — Assessment & Plan Note (Signed)
History of PAD status post left common iliac intervention by myself 09/26/2015 as well as documentation of 50% mid left SFA stenosis, high-grade calcified right common femoral artery stenosis and 990% right SFA in-stent restenosis with a high-grade calcified lesion just beyond this.  He underwent right common femoral endarterectomy and patch angioplasty by Dr. Trula Slade after this at my request.  I restudied him 02/20/2016 revealing a widely patent endarterectomy site with 80% in-stent restenosis within the right SFA stent which I intervened on as well as the high-grade calcified lesion just beyond this which I performed orbital atherectomy and drug-coated balloon angioplasty on as well.  His most recent lower extremity arterial Doppler studies performed 11/02/2018 revealed a right ABI of 1.07 and a left of 0.99.  His right SFA stent remained widely patent as did his left.  He does complain of some hip pain which is really not similar claudication.

## 2019-02-21 NOTE — Patient Instructions (Addendum)
Medication Instructions:  Your physician recommends that you continue on your current medications as directed. Please refer to the Current Medication list given to you today.  If you need a refill on your cardiac medications before your next appointment, please call your pharmacy.   Lab work: NONE If you have labs (blood work) drawn today and your tests are completely normal, you will receive your results only by: [name] MyChart Message (if you have MyChart) OR . A paper copy in the mail If you have any lab test that is abnormal or we need to change your treatment, we will call you to review the results.  Testing/Procedures: Your physician has requested that you have a lower or upper extremity arterial duplex. This test is an ultrasound of the arteries in the legs or arms. It looks at arterial blood flow in the legs and arms. Allow one hour for Lower and Upper Arterial scans. There are no restrictions or special instructions DUE June 2021  Your physician has requested that you have an ankle brachial index (ABI). During this test an ultrasound and blood pressure cuff are used to evaluate the arteries that supply the arms and legs with blood. Allow thirty minutes for this exam. There are no restrictions or special instructions. DUE June 2021  Your physician has requested that you have an aorta/iliac duplex. During this test, an ultrasound is used to evaluate blood flow to the aorta and iliac arteries. Allow one hour for this exam. Do not eat after midnight the day before and avoid carbonated beverages. DUE June 2021   Follow-Up: At [facility], you and your health needs are our priority.  As part of our continuing mission to provide you with exceptional heart care, we have created designated Provider Care Teams.  These Care Teams include your primary Cardiologist (physician) and Advanced Practice Providers (APPs -  Physician Assistants and Nurse Practitioners) who all work together to provide you  with the care you need, when you need it. . You will need a follow up appointment with Dr. [name] AFTER YOUR ULTRASOUNDS.  Please call our office 2 months in advance to schedule this/each appointment.     Any Other Special Instructions Will Be Listed Below (If Applicable). KEEP A BLOOD PRESSURE LOG FOR 4 WEEKS (STARTING March 08, 2019) THEN FOLLOW UP WITH A CLINICAL PHARMACIST IN THE HYPERTENSION CLINIC. YOU WILL NEED AN IN-PERSON APPOINTMENT IN THE OFFICE. PLEASE CALL TO SCHEDULE THIS IN-PERSON APPOINTMENT IF YOU DID NOT ALREADY DO SO DURING YOUR LAST VISIT AT [facility].

## 2019-02-21 NOTE — Progress Notes (Signed)
02/21/2019 Brian Brock   06/16/1945  [id number]  Primary Physician [name], MD Primary Cardiologist: Lorretta Harp MD FACP, [place], [place], [place]  HPI:  Brian Brock is a 73 y.o. gentleman with a history of CAD status post bypass grafting in the past and subsequent LAD intervention by Dr. Claiborne Billings. I last saw him in the office  10/17/2016. He has had bilateral SFA stents placed as well.[name] He's had right greater than left leg; occasional with recent Doppler studies suggest a high-frequency signal in his mid right SFA. He underwent peripheral angiography and intervention by myself 09/26/15 which time I stented his left external iliac artery. I did demonstrate 5060% segmental stenosis of his mid left SFA with two-vessel runoff. He had a 90% calcified exophytic plaque in his right common femoral artery, 90% "in-stent restenosis within his replaced right SFA stent for any percent focal stenosis just proximal to the stent, 95% calcified stenosis distal to the stent with two-vessel runoff. He essentially underwent right common femoral endarterectomy and patch angioplasty by Dr. Trula Slade A/4/17 which moderately improved his claudication. He underwentstaged right SFA diamondback orbital rotational atherectomy, drug eluting balloon angioplasty of "in-stent restenosis within the mid right SFA nitinol stent as well as high-grade calcified fairly focal mid to distal right SFA stenosis beyond the stent. He had an excellent angiographic, clinical and ultrasound result. His right ABI improved from 0.54 up to 0.93 and his claudication resolved.   His most recent Dopplers performed 11/02/2018 revealed a right ABI of 1.07 and a left ABI of 0.99.  His stents appeared widely patent.  Does complain of some bilateral hip pain which really does not sound like claudication.  He also complains of some right ankle swelling which was not present on exam today.  He has had a DVT on the right with pulmonary emboli  currently on Xarelto.  Current Meds  Medication Sig  . albuterol (PROAIR HFA) 108 (90 Base) MCG/ACT inhaler Inhale 2 puffs every 6 hours as needed  . albuterol (PROVENTIL) (2.5 MG/3ML) 0.083% nebulizer solution Take 3 mLs (2.5 mg total) by nebulization every 6 (six) hours as needed for wheezing or shortness of breath.  [name] aspirin EC 81 MG tablet Take 81 mg by mouth daily.   [name] BYSTOLIC 5 MG tablet TAKE 1/2 TABLET ONCE DAILY (Patient taking differently: Take 2.5 mg by mouth daily. )  . cetirizine (ZYRTEC) 10 MG tablet Take 10 mg by mouth daily.   . cholestyramine (QUESTRAN) 4 g packet Take 0.5 packets (2 g total) by mouth 2 (two) times daily with a meal.  . cyclobenzaprine (FLEXERIL) 10 MG tablet Take 10 mg by mouth 3 (three) times daily as needed for muscle spasms.   . isosorbide mononitrate (IMDUR) 30 MG 24 hr tablet TAKE 1/2 TABLET BY MOUTH EVERY DAY (Patient taking differently: Take 15 mg by mouth daily. )  . levothyroxine (SYNTHROID, LEVOTHROID) 25 MCG tablet Take 25 mcg by mouth daily before breakfast.   . loperamide (IMODIUM A-D) 2 MG tablet Take 2 mg by mouth 4 (four) times daily as needed for diarrhea or loose stools.  [name] losartan (COZAAR) 50 MG tablet Take 1 tablet (50 mg total) by mouth daily.  . Nebulizers (COMPRESSOR/NEBULIZER) MISC Use every 6 hours if needed  . nitroGLYCERIN (NITROSTAT) 0.4 MG SL tablet PLACE 1 TABLET UNDER THE TONGUE, MAY REPEAT EVERY 5 MINUTES MAX 3 TABLETS  . pantoprazole (PROTONIX) 40 MG tablet Take 1 tablet (40 mg total)  by mouth 2 (two) times daily. (Patient taking differently: Take 40 mg by mouth at bedtime. )  . Probiotic Product (PROBIOTIC PO) Take 1 capsule by mouth daily.   . rivaroxaban (XARELTO) 20 MG TABS tablet Take 1 tablet (20 mg total) by mouth daily with supper.  . rosuvastatin (CRESTOR) 20 MG tablet TAKE ONE TABLET BY MOUTH EVERY DAY (Patient taking differently: Take 20 mg by mouth daily. )  . triamcinolone cream (KENALOG) 0.1 % Apply 1 application  topically as needed (for itching).   . zolpidem (AMBIEN) 10 MG tablet Take 5-10 mg by mouth at bedtime.      Allergies  Allergen Reactions  . Other Anaphylaxis, Shortness Of Breath and Swelling    [place] nuts- "will kill me"  . Clarithromycin Nausea Only  . Fenofibrate Micronized Other (See Comments)    Caused weakness/lethargy  . Metoprolol Other (See Comments)    Fatigue and malaise  . Tricor [Fenofibrate] Other (See Comments)    Tired/weak  . Fish Oil Rash  . Levofloxacin Other (See Comments)    Doesn't remember  . Meloxicam Other (See Comments)    Achy, fatigue  . Metaxalone Other (See Comments)    Doesn't remember  . Niaspan [Niacin] Other (See Comments)    Doesn't remember  . Sulfadiazine Hives and Rash    Social History   Socioeconomic History  . Marital status: Married    Spouse name: Not on file  . Number of children: Not on file  . Years of education: Not on file  . Highest education level: Not on file  Occupational History  . Not on file  Social Needs  . Financial resource strain: Not on file  . Food insecurity    Worry: Not on file    Inability: Not on file  . Transportation needs    Medical: Not on file    Non-medical: Not on file  Tobacco Use  . Smoking status: Current Every Day Smoker    Packs/day: 0.50    Years: 56.00    Pack years: 28.00    Types: Cigarettes    Last attempt to quit: 03/25/2016    Years since quitting: 2.9  . Smokeless tobacco: Former Systems developer    Types: Chew  Substance and Sexual Activity  . Alcohol use: Yes    Alcohol/week: 1.0 standard drinks    Types: 1 Standard drinks or equivalent per week    Comment: 09/26/2015 "varies; might have a few drinks q 2 months"  . Drug use: No  . Sexual activity: Not Currently  Lifestyle  . Physical activity    Days per week: Not on file    Minutes per session: Not on file  . Stress: Not on file  Relationships  . Social Herbalist on phone: Not on file    Gets together: Not on  file    Attends religious service: Not on file    Active member of club or organization: Not on file    Attends meetings of clubs or organizations: Not on file    Relationship status: Not on file  . Intimate partner violence    Fear of current or ex partner: Not on file    Emotionally abused: Not on file    Physically abused: Not on file    Forced sexual activity: Not on file  Other Topics Concern  . Not on file  Social History Narrative  . Not on file     Review of Systems:  General: negative for chills, fever, night sweats or weight changes.  Cardiovascular: negative for chest pain, dyspnea on exertion, edema, orthopnea, palpitations, paroxysmal nocturnal dyspnea or shortness of breath Dermatological: negative for rash Respiratory: negative for cough or wheezing Urologic: negative for hematuria Abdominal: negative for nausea, vomiting, diarrhea, bright red blood per rectum, melena, or hematemesis Neurologic: negative for visual changes, syncope, or dizziness All other systems reviewed and are otherwise negative except as noted above.    Blood pressure (!) 151/67, pulse (!) 56, height 5\' 9"  (1.753 m), weight 188 lb 3.2 oz (85.4 kg), SpO2 98 %.  General appearance: alert and no distress Neck: no adenopathy, no carotid bruit, no JVD, supple, symmetrical, trachea midline and thyroid not enlarged, symmetric, no tenderness/mass/nodules Lungs: clear to auscultation bilaterally Heart: regular rate and rhythm, S1, S2 normal, no murmur, click, rub or gallop Extremities: extremities normal, atraumatic, no cyanosis or edema Pulses: 2+ and symmetric Skin: Skin color, texture, turgor normal. No rashes or lesions Neurologic: Alert and oriented X 3, normal strength and tone. Normal symmetric reflexes. Normal coordination and gait  EKG sinus bradycardia 56 with occasional PVCs.  I personally reviewed this EKG.  ASSESSMENT AND PLAN:   PVD- multiple LE interventions History of PAD status  post left common iliac intervention by myself 09/26/2015 as well as documentation of 50% mid left SFA stenosis, high-grade calcified right common femoral artery stenosis and 990% right SFA in-stent restenosis with a high-grade calcified lesion just beyond this.  He underwent right common femoral endarterectomy and patch angioplasty by Dr. Trula Slade after this at my request.  I restudied him 02/20/2016 revealing a widely patent endarterectomy site with 80% in-stent restenosis within the right SFA stent which I intervened on as well as the high-grade calcified lesion just beyond this which I performed orbital atherectomy and drug-coated balloon angioplasty on as well.  His most recent lower extremity arterial Doppler studies performed 11/02/2018 revealed a right ABI of 1.07 and a left of 0.99.  His right SFA stent remained widely patent as did his left.  He does complain of some hip pain which is really not similar claudication.      Lorretta Harp MD FACP,FACC,FAHA, [facility] 02/21/2019 10:09 AM

## 2019-03-07 ENCOUNTER — Telehealth: Payer: Self-pay | Admitting: *Deleted

## 2019-03-07 NOTE — Telephone Encounter (Signed)
Patient called asking if Dr. Maylon Peppers anticipates cutting the dose of Xarelto down by half by Feb 2021 when needs renewed.  Patient using [facility] based out of [place] which enables him to get his drug much cheaper.  Informed patient that this pharmacy is not in our network and could not escribe to this.  Patient said he will have a representative contact our office about this company and give more information regarding their services.

## 2019-04-05 ENCOUNTER — Other Ambulatory Visit: Payer: Self-pay

## 2019-04-05 ENCOUNTER — Ambulatory Visit (INDEPENDENT_AMBULATORY_CARE_PROVIDER_SITE_OTHER): Payer: Medicare Other | Admitting: Pharmacist

## 2019-04-05 VITALS — BP 122/60 | HR 54 | Ht 69.0 in | Wt 190.0 lb

## 2019-04-05 DIAGNOSIS — I2510 Atherosclerotic heart disease of native coronary artery without angina pectoris: Secondary | ICD-10-CM | POA: Diagnosis not present

## 2019-04-05 DIAGNOSIS — E785 Hyperlipidemia, unspecified: Secondary | ICD-10-CM | POA: Diagnosis not present

## 2019-04-05 DIAGNOSIS — I10 Essential (primary) hypertension: Secondary | ICD-10-CM

## 2019-04-05 NOTE — Progress Notes (Signed)
Patient ID: MADSEN FROHN                 DOB: 1946/01/31                      MRN: [id number]     HPI:  Brian Brock is a 73 y.o. male referred by Dr. Gwenlyn Found to HTN clinic.  PMH includes CAD s/p CABG x 2 in 2014, PAF, hypertension, hx of PE, COPD, and metabolic syndrome.  Patient presents today for evaluation and medication titration. Denies dizziness, swelling, increased fatigue or any other problems. Reports compliances wit all medication as prescribed. He is "feeling better" since changing Bytolic and Xarelto administration time to evenings.  Current HTN meds:  Bystolic 5mg  - takes 1/2 tablet in evening wit Xarelto Losartan 50mg  daily  Previously tried:   BP goal: 130/80 or less  Family History: The patient's family history includes Coronary artery disease (age of onset: 71) in his father; Coronary artery disease (age of onset: 2) in his brother; Deep vein thrombosis in his brother; Heart attack in his father. There is no history of Colon cancer, Esophageal cancer, Rectal cancer, or Stomach cancer.  Social History: smoker , occasional alcohol use  Diet: 4-5x/week eating out  Exercise: bicycle daily -  just started, and 3x/day walking   Home BP readings:  21 readings; average 137/75; HR no data  Wt Readings from Last 3 Encounters:  04/05/19 190 lb (86.2 kg)  02/21/19 188 lb 3.2 oz (85.4 kg)  02/09/19 188 lb 9.6 oz (85.5 kg)   BP Readings from Last 3 Encounters:  04/05/19 122/60  02/21/19 (!) 151/67  02/09/19 130/70   Pulse Readings from Last 3 Encounters:  04/05/19 (!) 54  02/21/19 (!) 56  02/09/19 (!) 47    Past Medical History:  Diagnosis Date  . AF (atrial fibrillation) ([place])    happened after CABG, has not had it since  . Allergic rhinitis   . Allergy   . Anginal pain ([place]) 1997  . Arthritis    "lower back" (09/26/2015)  . Childhood asthma   . Chronic bronchitis ([place])    Yearly  . COPD (chronic obstructive pulmonary disease) ([place])   . Coronary  artery disease    Multiple stents  . Dyslipidemia   . Dysrhythmia   . Femoral hernia of right side 12/28/2016  . GERD (gastroesophageal reflux disease)   . History of hiatal hernia   . Hyperlipemia   . Hypertension   . Hypothyroidism   . Irritable bowel syndrome   . Myocardial infarct ([place]) 1997   "mild"  . PE (pulmonary thromboembolism) ([place]) 05/2018  . Peripheral vascular disease ([place])    prior stenting  . Restless legs     Current Outpatient Medications on File Prior to Visit  Medication Sig Dispense Refill  . albuterol (PROAIR HFA) 108 (90 Base) MCG/ACT inhaler Inhale 2 puffs every 6 hours as needed 1 Inhaler 12  . albuterol (PROVENTIL) (2.5 MG/3ML) 0.083% nebulizer solution Take 3 mLs (2.5 mg total) by nebulization every 6 (six) hours as needed for wheezing or shortness of breath. 75 mL 12  . aspirin EC 81 MG tablet Take 81 mg by mouth daily.     [name] BYSTOLIC 5 MG tablet TAKE 1/2 TABLET ONCE DAILY (Patient taking differently: Take 2.5 mg by mouth daily. ) 30 tablet 6  . cetirizine (ZYRTEC) 10 MG tablet Take 10 mg by mouth daily.     [name]  cholestyramine (QUESTRAN) 4 g packet Take 0.5 packets (2 g total) by mouth 2 (two) times daily with a meal. 60 each 12  . cyclobenzaprine (FLEXERIL) 10 MG tablet Take 10 mg by mouth 3 (three) times daily as needed for muscle spasms.     . isosorbide mononitrate (IMDUR) 30 MG 24 hr tablet TAKE 1/2 TABLET BY MOUTH EVERY DAY (Patient taking differently: Take 15 mg by mouth daily. ) 45 tablet 2  . levothyroxine (SYNTHROID, LEVOTHROID) 25 MCG tablet Take 25 mcg by mouth daily before breakfast.     . loperamide (IMODIUM A-D) 2 MG tablet Take 2 mg by mouth 4 (four) times daily as needed for diarrhea or loose stools.    [name] losartan (COZAAR) 50 MG tablet Take 1 tablet (50 mg total) by mouth daily. 30 tablet 0  . Nebulizers (COMPRESSOR/NEBULIZER) MISC Use every 6 hours if needed 1 each 0  . nitroGLYCERIN (NITROSTAT) 0.4 MG SL tablet PLACE 1 TABLET UNDER THE  TONGUE, MAY REPEAT EVERY 5 MINUTES MAX 3 TABLETS 25 tablet 11  . pantoprazole (PROTONIX) 40 MG tablet Take 1 tablet (40 mg total) by mouth 2 (two) times daily. (Patient taking differently: Take 40 mg by mouth at bedtime. ) 60 tablet 0  . Probiotic Product (PROBIOTIC PO) Take 1 capsule by mouth daily.     . rivaroxaban (XARELTO) 20 MG TABS tablet Take 1 tablet (20 mg total) by mouth daily with supper. 90 tablet 1  . rosuvastatin (CRESTOR) 20 MG tablet TAKE ONE TABLET BY MOUTH EVERY DAY (Patient taking differently: Take 20 mg by mouth daily. ) 90 tablet 3  . triamcinolone cream (KENALOG) 0.1 % Apply 1 application topically as needed (for itching).     . zolpidem (AMBIEN) 10 MG tablet Take 5-10 mg by mouth at bedtime.      No current facility-administered medications on file prior to visit.     Allergies  Allergen Reactions  . Other Anaphylaxis, Shortness Of Breath and Swelling    [place] nuts- "will kill me"  . Clarithromycin Nausea Only  . Fenofibrate Micronized Other (See Comments)    Caused weakness/lethargy  . Metoprolol Other (See Comments)    Fatigue and malaise  . Tricor [Fenofibrate] Other (See Comments)    Tired/weak  . Fish Oil Rash  . Levofloxacin Other (See Comments)    Doesn't remember  . Meloxicam Other (See Comments)    Achy, fatigue  . Metaxalone Other (See Comments)    Doesn't remember  . Niaspan [Niacin] Other (See Comments)    Doesn't remember  . Sulfadiazine Hives and Rash    Blood pressure 122/60, pulse (!) 54, height 5\' 9"  (1.753 m), weight 190 lb (86.2 kg), SpO2 96 %.  Essential hypertension Blood pressure at goal during office visit  and greatly improved at home as well. Patient denies problems with his current therapy and continues to work n positive lifestyle modifications.   Will continue all medication without changes, follow up in 2 weeks (by phone) and increase losartan to 100mg  if additional BP control needed.     Stellah Donovan Rodriguez-Guzman PharmD,  BCPS, [place] [address] 04/11/2019 5:20 PM

## 2019-04-05 NOTE — Patient Instructions (Addendum)
Return for a follow up appointment in 2 weeks (by phone)  Check your blood pressure at home daily (if able) and keep record of the readings.  Complete blood work: AS SOON AS POSSIBLE (fasting)  Take your BP meds as follows: *NO CHANGE IN MEDICATION* *MONITOR BP twice daily*  Plan to change losartan to valsartan if blood pressure remians elevated after 2 weeks*  Bring all of your meds, your BP cuff and your record of home blood pressures to your next appointment.  Exercise as you're able, try to walk approximately 30 minutes per day.  Keep salt intake to a minimum, especially watch canned and prepared boxed foods.  Eat more fresh fruits and vegetables and fewer canned items.  Avoid eating in fast food restaurants.    HOW TO TAKE YOUR BLOOD PRESSURE: . Rest 5 minutes before taking your blood pressure. .  Don't smoke or drink caffeinated beverages for at least 30 minutes before. . Take your blood pressure before (not after) you eat. . Sit comfortably with your back supported and both feet on the floor (don't cross your legs). . Elevate your arm to heart level on a table or a desk. . Use the proper sized cuff. It should fit smoothly and snugly around your bare upper arm. There should be enough room to slip a fingertip under the cuff. The bottom edge of the cuff should be 1 inch above the crease of the elbow. . Ideally, take 3 measurements at one sitting and record the average.

## 2019-04-06 LAB — PSA: Prostate Specific Ag, Serum: 0.6 ng/mL (ref 0.0–4.0)

## 2019-04-06 LAB — HEMOGLOBIN A1C
Est. average glucose Bld gHb Est-mCnc: 126 mg/dL
Hgb A1c MFr Bld: 6 % — ABNORMAL HIGH (ref 4.8–5.6)

## 2019-04-06 LAB — LIPID PANEL WITH LDL/HDL RATIO
Cholesterol, Total: 131 mg/dL (ref 100–199)
HDL: 36 mg/dL — ABNORMAL LOW (ref >39)
LDL Chol Calc (NIH): 69 mg/dL (ref 0–99)
LDL/HDL Ratio: 1.9 ratio (ref 0.0–3.6)
Triglycerides: 148 mg/dL (ref 0–149)
VLDL Cholesterol Cal: 26 mg/dL (ref 5–40)

## 2019-04-06 LAB — TSH: TSH: 2.43 u[IU]/mL (ref 0.450–4.500)

## 2019-04-11 ENCOUNTER — Encounter: Payer: Self-pay | Admitting: Pharmacist

## 2019-04-11 NOTE — Assessment & Plan Note (Signed)
Blood pressure at goal during office visit  and greatly improved at home as well. Patient denies problems with his current therapy and continues to work n positive lifestyle modifications.   Will continue all medication without changes, follow up in 2 weeks (by phone) and increase losartan to 100mg  if additional BP control needed.

## 2019-06-07 ENCOUNTER — Telehealth: Payer: Self-pay | Admitting: Hematology

## 2019-06-07 ENCOUNTER — Other Ambulatory Visit: Payer: Self-pay | Admitting: Hematology

## 2019-06-07 ENCOUNTER — Other Ambulatory Visit: Payer: Self-pay | Admitting: *Deleted

## 2019-06-07 DIAGNOSIS — I824Y1 Acute embolism and thrombosis of unspecified deep veins of right proximal lower extremity: Secondary | ICD-10-CM

## 2019-06-07 MED ORDER — RIVAROXABAN 10 MG PO TABS: 10.0000 mg | ORAL_TABLET | Freq: Every day | ORAL | 3 refills | Status: DC

## 2019-06-07 NOTE — Telephone Encounter (Signed)
Called and spoke with patient about r/s his Feb appts.  Appts were moved up to 1/28 per 1/20 staff message from Dr Maylon Peppers.  Patient was ok with new date/time

## 2019-06-15 ENCOUNTER — Inpatient Hospital Stay: Payer: Medicare Other | Attending: Hematology

## 2019-06-15 ENCOUNTER — Encounter: Payer: Self-pay | Admitting: Hematology

## 2019-06-15 ENCOUNTER — Inpatient Hospital Stay (HOSPITAL_BASED_OUTPATIENT_CLINIC_OR_DEPARTMENT_OTHER): Payer: Medicare Other | Admitting: Hematology

## 2019-06-15 ENCOUNTER — Other Ambulatory Visit: Payer: Self-pay | Admitting: *Deleted

## 2019-06-15 ENCOUNTER — Other Ambulatory Visit: Payer: Self-pay

## 2019-06-15 VITALS — BP 127/59 | HR 55 | Temp 97.6°F | Resp 18 | Ht 69.0 in | Wt 189.0 lb

## 2019-06-15 DIAGNOSIS — Z86718 Personal history of other venous thrombosis and embolism: Secondary | ICD-10-CM | POA: Insufficient documentation

## 2019-06-15 DIAGNOSIS — Z7901 Long term (current) use of anticoagulants: Secondary | ICD-10-CM | POA: Insufficient documentation

## 2019-06-15 DIAGNOSIS — I2699 Other pulmonary embolism without acute cor pulmonale: Secondary | ICD-10-CM

## 2019-06-15 DIAGNOSIS — F17210 Nicotine dependence, cigarettes, uncomplicated: Secondary | ICD-10-CM | POA: Diagnosis not present

## 2019-06-15 DIAGNOSIS — I824Y1 Acute embolism and thrombosis of unspecified deep veins of right proximal lower extremity: Secondary | ICD-10-CM

## 2019-06-15 DIAGNOSIS — Z79899 Other long term (current) drug therapy: Secondary | ICD-10-CM | POA: Diagnosis not present

## 2019-06-15 LAB — CBC WITH DIFFERENTIAL (CANCER CENTER ONLY)
Abs Immature Granulocytes: 0.02 10*3/uL (ref 0.00–0.07)
Basophils Absolute: 0.1 10*3/uL (ref 0.0–0.1)
Basophils Relative: 1 %
Eosinophils Absolute: 0.2 10*3/uL (ref 0.0–0.5)
Eosinophils Relative: 2 %
HCT: 44 % (ref 39.0–52.0)
Hemoglobin: 14.5 g/dL (ref 13.0–17.0)
Immature Granulocytes: 0 %
Lymphocytes Relative: 31 %
Lymphs Abs: 2.5 10*3/uL (ref 0.7–4.0)
MCH: 29.1 pg (ref 26.0–34.0)
MCHC: 33 g/dL (ref 30.0–36.0)
MCV: 88.2 fL (ref 80.0–100.0)
Monocytes Absolute: 0.7 10*3/uL (ref 0.1–1.0)
Monocytes Relative: 8 %
Neutro Abs: 4.6 10*3/uL (ref 1.7–7.7)
Neutrophils Relative %: 58 %
Platelet Count: 230 10*3/uL (ref 150–400)
RBC: 4.99 MIL/uL (ref 4.22–5.81)
RDW: 13.4 % (ref 11.5–15.5)
WBC Count: 8 10*3/uL (ref 4.0–10.5)
nRBC: 0 % (ref 0.0–0.2)

## 2019-06-15 LAB — CMP (CANCER CENTER ONLY)
ALT: 21 U/L (ref 0–44)
AST: 21 U/L (ref 15–41)
Albumin: 3.9 g/dL (ref 3.5–5.0)
Alkaline Phosphatase: 75 U/L (ref 38–126)
Anion gap: 6 (ref 5–15)
BUN: 15 mg/dL (ref 8–23)
CO2: 29 mmol/L (ref 22–32)
Calcium: 8.9 mg/dL (ref 8.9–10.3)
Chloride: 108 mmol/L (ref 98–111)
Creatinine: 1.06 mg/dL (ref 0.61–1.24)
GFR, Est AFR Am: >60 mL/min (ref >60)
GFR, Estimated: >60 mL/min (ref >60)
Glucose, Bld: 168 mg/dL — ABNORMAL HIGH (ref 70–99)
Potassium: 3.9 mmol/L (ref 3.5–5.1)
Sodium: 143 mmol/L (ref 135–145)
Total Bilirubin: 0.6 mg/dL (ref 0.3–1.2)
Total Protein: 6.3 g/dL — ABNORMAL LOW (ref 6.5–8.1)

## 2019-06-15 LAB — D-DIMER, QUANTITATIVE: D-Dimer, Quant: 0.34 ug/mL-FEU (ref 0.00–0.50)

## 2019-06-15 MED ORDER — RIVAROXABAN 20 MG PO TABS: 20.0000 mg | ORAL_TABLET | Freq: Every day | ORAL | 3 refills | Status: DC

## 2019-06-15 MED ORDER — RIVAROXABAN 20 MG PO TABS: 20.0000 mg | ORAL_TABLET | Freq: Every day | ORAL | 1 refills | Status: DC

## 2019-06-15 NOTE — Progress Notes (Signed)
[place] OFFICE PROGRESS NOTE  Patient Care Team: [name], MD as PCP - General (Family Medicine) Troy Sine, MD as PCP - Cardiology (Cardiology) Lorretta Harp, MD as Consulting Physician (Cardiology) Troy Sine, MD as Consulting Physician (Cardiology) Deneise Lever, MD as Consulting Physician (Pulmonary Disease)  HEME/ONC OVERVIEW: 1. Unprovoked RLE DVT and bilateral PTE  -05/2018:   Extensive acute DVT involving the R femoral, popliteal, posterior tibial and peroneal vein on doppler  Small bilateral PTE without evidence of RV strain on CTA chest; no malignancy on scans  -05/2018 - present: Xarelto 20mg  daily   TREATMENT REGIMEN:  05/24/2018 - present: Xarelto 20mg  daily   ASSESSMENT & PLAN:   Unprovoked RLE DVT and bilateral PTE  -Currently on Xarelto 20 mg daily without any significant bleeding or bruising -No evidence for lower extremity swelling or other symptoms to suggest recurrent VTE today -Goal of anticoagulation is lifelong, given unprovoked DVT and PTE -Given the extensive lower extremity DVT and bilateral PTE, we will continue Xarelto 20mg  daily (instead of reducing the dose for secondary ppx) -I reinforced the importance of preventive strategies such as avoiding hormonal supplement, smoking cessation (see below), keeping up-to-date with screening programs for early cancer detection, frequent ambulation for long distance travel and aggressive DVT prophylaxis in all surgical settings. -Should he need any interruption of the anticoagulation for elective procedures in the future, feel free to contact me regarding peri-operative management.  Tobacco cessation -Patient still currently smoking approximately half to three quarters of a pack per day -He tried nicotine gums but did not like the taste; he also wears a nicotine patch periodically  -I counseled the patient again today regarding the importance of smoking cessation, as well  as avoiding smoking and using nicotine patch at the same time  -Patient expressed understanding, and will continue to work with his PCP on smoking cessation   Orders Placed This Encounter  Procedures  . CBC with Differential (Cancer Center Only)    Standing Status:   Future    Standing Expiration Date:   07/19/2020  . CMP ([facility] only)    Standing Status:   Future    Standing Expiration Date:   07/19/2020   The total time spent in the appointment was 31 minutes encounter with patients including review of chart and various tests results, discussions about plan of care and coordination of care plan  All questions were answered. The patient knows to call the clinic with any problems, questions or concerns. No barriers to learning was detected.  Return in 6 months for labs and clinic follow-up.  Tish Men, MD 1/28/202111:41 AM  CHIEF COMPLAINT: "I am doing fine"  INTERVAL HISTORY: Mr. Nicodemus returns clinic for follow-up of unprovoked right lower extremity DVT and bilateral PTE on Xarelto.  Patient reports that he has been doing well with Xarelto 20 mg daily, and denies any abnormal bleeding or excess bruising.  He tried nicotine gums for smoking cessation, but did not like the taste.  He also was prescribed nicotine patch, but he only wears it periodically.  He still smokes half a pack to three quarters of a pack daily.  He denies any other complaint today.  REVIEW OF SYSTEMS:   Constitutional: ( - ) fevers, ( - )  chills , ( - ) night sweats Eyes: ( - ) blurriness of vision, ( - ) double vision, ( - ) watery eyes Ears, nose, mouth, throat, and face: ( - )  mucositis, ( - ) sore throat Respiratory: ( - ) cough, ( - ) dyspnea, ( - ) wheezes Cardiovascular: ( - ) palpitation, ( - ) chest discomfort, ( - ) lower extremity swelling Gastrointestinal:  ( - ) nausea, ( - ) heartburn, ( - ) change in bowel habits Skin: ( - ) abnormal skin rashes Lymphatics: ( - ) new lymphadenopathy, ( - )  easy bruising Neurological: ( - ) numbness, ( - ) tingling, ( - ) new weaknesses Behavioral/Psych: ( - ) mood change, ( - ) new changes  All other systems were reviewed with the patient and are negative.  SUMMARY OF ONCOLOGIC HISTORY: Oncology History   No history exists.    I have reviewed the past medical history, past surgical history, social history and family history with the patient and they are unchanged from previous note.  ALLERGIES:  is allergic to other; clarithromycin; fenofibrate micronized; metoprolol; tricor [fenofibrate]; fish oil; levofloxacin; meloxicam; metaxalone; niaspan [niacin]; and sulfadiazine.  MEDICATIONS:  Current Outpatient Medications  Medication Sig Dispense Refill  . albuterol (PROAIR HFA) 108 (90 Base) MCG/ACT inhaler Inhale 2 puffs every 6 hours as needed 1 Inhaler 12  . albuterol (PROVENTIL) (2.5 MG/3ML) 0.083% nebulizer solution Take 3 mLs (2.5 mg total) by nebulization every 6 (six) hours as needed for wheezing or shortness of breath. 75 mL 12  . aspirin EC 81 MG tablet Take 81 mg by mouth daily.     [name] BYSTOLIC 5 MG tablet TAKE 1/2 TABLET ONCE DAILY (Patient taking differently: Take 2.5 mg by mouth daily. ) 30 tablet 6  . cetirizine (ZYRTEC) 10 MG tablet Take 10 mg by mouth daily.     . cholestyramine (QUESTRAN) 4 g packet Take 0.5 packets (2 g total) by mouth 2 (two) times daily with a meal. 60 each 12  . cyclobenzaprine (FLEXERIL) 10 MG tablet Take 10 mg by mouth 3 (three) times daily as needed for muscle spasms.     . isosorbide mononitrate (IMDUR) 30 MG 24 hr tablet TAKE 1/2 TABLET BY MOUTH EVERY DAY (Patient taking differently: Take 15 mg by mouth daily. ) 45 tablet 2  . levothyroxine (SYNTHROID, LEVOTHROID) 25 MCG tablet Take 25 mcg by mouth daily before breakfast.     . loperamide (IMODIUM A-D) 2 MG tablet Take 2 mg by mouth 4 (four) times daily as needed for diarrhea or loose stools.    [name] losartan (COZAAR) 50 MG tablet Take 1 tablet (50 mg  total) by mouth daily. 30 tablet 0  . Nebulizers (COMPRESSOR/NEBULIZER) MISC Use every 6 hours if needed 1 each 0  . nitroGLYCERIN (NITROSTAT) 0.4 MG SL tablet PLACE 1 TABLET UNDER THE TONGUE, MAY REPEAT EVERY 5 MINUTES MAX 3 TABLETS 25 tablet 11  . pantoprazole (PROTONIX) 40 MG tablet Take 1 tablet (40 mg total) by mouth 2 (two) times daily. (Patient taking differently: Take 40 mg by mouth at bedtime. ) 60 tablet 0  . Probiotic Product (PROBIOTIC PO) Take 1 capsule by mouth daily.     . rivaroxaban (XARELTO) 20 MG TABS tablet Take 1 tablet (20 mg total) by mouth daily with supper. [facility]. Fax [phone]. Phone [phone] 90 tablet 3  . rosuvastatin (CRESTOR) 20 MG tablet TAKE ONE TABLET BY MOUTH EVERY DAY (Patient taking differently: Take 20 mg by mouth daily. ) 90 tablet 3  . triamcinolone cream (KENALOG) 0.1 % Apply 1 application topically as needed (for itching).     . zolpidem (AMBIEN) 10 MG  tablet Take 5-10 mg by mouth at bedtime.      No current facility-administered medications for this visit.    PHYSICAL EXAMINATION: ECOG PERFORMANCE STATUS: 0 - Asymptomatic  Today's Vitals   06/15/19 1130  BP: (!) 127/59  Pulse: (!) 55  Resp: 18  Temp: 97.6 F (36.4 C)  TempSrc: Temporal  SpO2: 99%  Weight: 189 lb (85.7 kg)  Height: 5\' 9"  (1.753 m)  PainSc: 0-No pain   Body mass index is 27.91 kg/m.  Filed Weights   06/15/19 1130  Weight: 189 lb (85.7 kg)    GENERAL: alert, no distress and comfortable SKIN: skin color, texture, turgor are normal, no rashes or significant lesions EYES: conjunctiva are pink and non-injected, sclera clear OROPHARYNX: no exudate, no erythema; lips, buccal mucosa, and tongue normal  NECK: supple, non-tender LUNGS: clear to auscultation with normal breathing effort HEART: regular rate & rhythm and no murmurs and no lower extremity edema ABDOMEN: soft, non-tender, non-distended, normal bowel sounds Musculoskeletal: no cyanosis of  digits and no clubbing  PSYCH: alert & oriented x 3, fluent speech  LABORATORY DATA:  I have reviewed the data as listed    Component Value Date/Time   NA 143 06/15/2019 1052   NA 142 02/07/2018 0915   K 3.9 06/15/2019 1052   CL 108 06/15/2019 1052   CO2 29 06/15/2019 1052   GLUCOSE 168 (H) 06/15/2019 1052   BUN 15 06/15/2019 1052   BUN 18 02/07/2018 0915   CREATININE 1.06 06/15/2019 1052   CREATININE 1.05 02/12/2016 1008   CALCIUM 8.9 06/15/2019 1052   PROT 6.3 (L) 06/15/2019 1052   PROT 6.0 02/07/2018 0915   ALBUMIN 3.9 06/15/2019 1052   ALBUMIN 3.9 02/07/2018 0915   AST 21 06/15/2019 1052   ALT 21 06/15/2019 1052   ALKPHOS 75 06/15/2019 1052   BILITOT 0.6 06/15/2019 1052   GFRNONAA >60 06/15/2019 1052   GFRAA >60 06/15/2019 1052    No results found for: SPEP, UPEP  Lab Results  Component Value Date   WBC 8.0 06/15/2019   NEUTROABS 4.6 06/15/2019   HGB 14.5 06/15/2019   HCT 44.0 06/15/2019   MCV 88.2 06/15/2019   PLT 230 06/15/2019      Chemistry      Component Value Date/Time   NA 143 06/15/2019 1052   NA 142 02/07/2018 0915   K 3.9 06/15/2019 1052   CL 108 06/15/2019 1052   CO2 29 06/15/2019 1052   BUN 15 06/15/2019 1052   BUN 18 02/07/2018 0915   CREATININE 1.06 06/15/2019 1052   CREATININE 1.05 02/12/2016 1008      Component Value Date/Time   CALCIUM 8.9 06/15/2019 1052   ALKPHOS 75 06/15/2019 1052   AST 21 06/15/2019 1052   ALT 21 06/15/2019 1052   BILITOT 0.6 06/15/2019 1052       RADIOGRAPHIC STUDIES: I have personally reviewed the radiological images as listed below and agreed with the findings in the report. No results found.

## 2019-06-15 NOTE — Telephone Encounter (Signed)
Per patient request, I faxed the Xarelto prescription to [facility] in [place]. Office number is [phone]. Fax # [phone].

## 2019-07-07 ENCOUNTER — Ambulatory Visit: Payer: Medicare Other | Admitting: Hematology

## 2019-07-07 ENCOUNTER — Other Ambulatory Visit: Payer: Medicare Other

## 2019-07-26 ENCOUNTER — Ambulatory Visit: Payer: Medicare Other | Admitting: Cardiology

## 2019-07-26 ENCOUNTER — Encounter: Payer: Self-pay | Admitting: Cardiology

## 2019-07-26 ENCOUNTER — Other Ambulatory Visit: Payer: Self-pay

## 2019-07-26 VITALS — BP 120/65 | HR 50 | Temp 97.8°F | Resp 16 | Ht 69.0 in | Wt 192.0 lb

## 2019-07-26 DIAGNOSIS — Z7901 Long term (current) use of anticoagulants: Secondary | ICD-10-CM

## 2019-07-26 DIAGNOSIS — E7800 Pure hypercholesterolemia, unspecified: Secondary | ICD-10-CM

## 2019-07-26 DIAGNOSIS — I2510 Atherosclerotic heart disease of native coronary artery without angina pectoris: Secondary | ICD-10-CM

## 2019-07-26 DIAGNOSIS — Z86711 Personal history of pulmonary embolism: Secondary | ICD-10-CM

## 2019-07-26 DIAGNOSIS — I739 Peripheral vascular disease, unspecified: Secondary | ICD-10-CM

## 2019-07-26 DIAGNOSIS — I10 Essential (primary) hypertension: Secondary | ICD-10-CM

## 2019-07-26 DIAGNOSIS — R0989 Other specified symptoms and signs involving the circulatory and respiratory systems: Secondary | ICD-10-CM

## 2019-07-26 NOTE — Progress Notes (Signed)
Primary Physician/Referring:  [name], MD  Patient ID: Brian Brock, male    DOB: Nov 14, 1945, 74 y.o.   MRN: [id number]  Chief Complaint  Patient presents with  . Hypertension  . New Patient (Initial Visit)    Continuation of Care   HPI:    Brian Brock  is a 74 y.o. Caucasian male with CAD with multiple angioplasties over the years, due to restenosis underwent CABG on 09/12/2012.  Past medical history significant for hypertension, hyperglycemia, hyperlipidemia, hypothyroidism, peripheral vascular disease with bilateral SFA stenting and angioplasty, right femoral endarterectomy and left iliac artery stent in past, spontaneous DVT and pulmonary embolism in January 2020 and has been on Xarelto since then.  He has chronic dyspnea on exertion and COPD with ongoing tobacco use, quit a week ago on 07/17/2019.  He wanted to establish with me for personal reasons.  He continues to have severe bilateral hip pain even with minimal activity and mild chronic dyspnea and cough but denies chest pain.  He is wondering whether he has had progression of coronary disease and whether he needs a stress test.  He was also having difficulty in controlling his blood pressure, but recently increased Bystolic on his own with good control of blood pressure.  Bilateral hip pain comes only with activity, both hips are involved.  He has not noticed any ulceration or changes in his coloration of his skin.  Past Medical History:  Diagnosis Date  . AF (atrial fibrillation) ([place])    happened after CABG, has not had it since  . Allergic rhinitis   . Allergy   . Anginal pain ([place]) 1997  . Arthritis    "lower back" (09/26/2015)  . Childhood asthma   . Chronic bronchitis ([place])    Yearly  . COPD (chronic obstructive pulmonary disease) ([place])   . Coronary artery disease    Multiple stents  . Dyslipidemia   . Dysrhythmia   . Femoral hernia of right side 12/28/2016  . GERD (gastroesophageal reflux  disease)   . History of hiatal hernia   . Hyperlipemia   . Hypertension   . Hypothyroidism   . Irritable bowel syndrome   . Myocardial infarct ([place]) 1997   "mild"  . PE (pulmonary thromboembolism) ([place]) 05/2018  . Peripheral vascular disease ([place])    prior stenting  . Restless legs    Past Surgical History:  Procedure Laterality Date  . CARDIAC CATHETERIZATION  2004   had remote tandem stents in 1997 and 1998 for LAD lesions, RCA stent in 1997 , midprto=ion, and a proximal RCA  SENT IN 1999, all DES; some were IVIS  guide.  [name] CARDIAC CATHETERIZATION  09/07/2012   demonstrated severe 2- vessel disease,patent LAD,patient circ and patent right stents, but 90% ostial LAD whic was new.  . CAROTID DOPPLER  100/24/11   RGT BULB and PROXIMAL ICA 0-49%; LFT. ICA  0-49 ; RGT SUBCLAVIAN ARTERY < 50% ; LFT SUBCLAVIAN normal patency; RGT  and LFT  VERTEBRAL ARTERIES  . COLONOSCOPY  11/06/2016   Diverticulosis of colon. Internal hemorrhoids.   . CORONARY ANGIOPLASTY    . CORONARY ANGIOPLASTY WITH STENT PLACEMENT  '97, '98, '04, '06, '08, '10   seven cardiac stents  . CORONARY ARTERY BYPASS GRAFT N/A 09/12/2012   Procedure: OFF PUMP CORONARY ARTERY BYPASS GRAFTING (CABG);  Surgeon: Melrose Nakayama, MD;  Location: [facility];  Service: Open Heart Surgery;  Laterality: N/A;  Times two using left internal mammary artery  and endoscopically harvested right saphenous vein  ;LIMA-LAD and SVG -OM1  . DOPPLER ECHOCARDIOGRAPHY  11/23/2012,09/30/2010    LV EF 50-55%----09/30/2010-LV EF = >55%  . ENDARTERECTOMY FEMORAL Right 12/19/2015   Procedure: ENDARTERECTOMY RIGHT FEMORAL ARTERY;  Surgeon: Serafina Mitchell, MD;  Location: [facility];  Service: Vascular;  Laterality: Right;  . event monitor  11/04/2012-11/17/2012   pt denies this hx on 07/24/2014  . FEMORAL ARTERY STENT Bilateral 7/06  8/08   Lt SFA '06, Rt SFA 8/08  . HERNIA REPAIR  10/2008   "repaired w/gallbladder OR"  . HERNIA REPAIR  07/2016  . INGUINAL  HERNIA REPAIR Right 12/28/2016   Procedure: OPEN REPAIR RIGHT GROIN HERNIA WITH MESH;  Surgeon: Fanny Skates, MD;  Location: [facility];  Service: General;  Laterality: Right;  . LAPAROSCOPIC CHOLECYSTECTOMY  June 2010  . LEFT HEART CATHETERIZATION WITH CORONARY ANGIOGRAM N/A 09/07/2012   Procedure: LEFT HEART CATHETERIZATION WITH CORONARY ANGIOGRAM;  Surgeon: Troy Sine, MD;  Location: [facility] CATH LAB;  Service: Cardiovascular;  Laterality: N/A;  . LEFT HEART CATHETERIZATION WITH CORONARY/GRAFT ANGIOGRAM N/A 07/24/2014   Procedure: LEFT HEART CATHETERIZATION WITH Beatrix Fetters;  Surgeon: Troy Sine, MD; LAD 70%, 50% ISR, mLAD 99%, CFX 30%, patent stent, RCA patent, LIMA-LAD occluded, SVG-OM 1 patent, EF 50-55%  . LEXISCAN STRESS TEST  09/06/2012  . LOWER ARTERIAL DOPPLER  03/25/2012   FGT CIA 0-49%; RGT CFA AND SFA AT STENT 0-49%; RGT DISTAL SFA = <50%; LFT. EIA  50-69%; LFT. CFA 0-49% ;;LFT. SFA  AT STENT NORMAL PATENCY  . NM MYOVIEW LTD  05/06/2012   EF -STUDY NOT GATED, NORMAL STRESS NUCLEAR ,NORMAL LV FUNCTION  . PATCH ANGIOPLASTY Right 12/19/2015   Procedure: PATCH ANGIOPLASTY RIGHT FEMORAL ARTERY;  Surgeon: Serafina Mitchell, MD;  Location: [facility];  Service: Vascular;  Laterality: Right;  . PERCUTANEOUS CORONARY STENT INTERVENTION (PCI-S) Right 07/24/2014   Procedure: PERCUTANEOUS CORONARY STENT INTERVENTION (PCI-S);  Surgeon: Troy Sine, MD; ostial LAD PTCA with angioscope scoring balloon, 3.0 x 34 mm Resolute DES extending from the ostium to the mid LAD, covering all significant stenoses   . PERIPHERAL VASCULAR CATHETERIZATION N/A 09/26/2015   Procedure: Abdominal Aortogram w/Lower Extremity;  Surgeon: Lorretta Harp, MD;  Location: [facility] CV LAB;  Service: Cardiovascular;  Laterality: N/A;  . PERIPHERAL VASCULAR CATHETERIZATION Right 02/20/2016   Procedure: Peripheral Vascular Atherectomy;  Surgeon: Lorretta Harp, MD;  Location: [facility] CV LAB;  Service: Cardiovascular;   Laterality: Right;  SFA  . PERIPHERAL VASCULAR CATHETERIZATION Right 02/20/2016   Procedure: Peripheral Vascular Balloon Angioplasty;  Surgeon: Lorretta Harp, MD;  Location: [facility] CV LAB;  Service: Cardiovascular;  Laterality: Right;  SFA  . TONSILLECTOMY  1950's  . UPPER GASTROINTESTINAL ENDOSCOPY     Family History  Problem Relation Age of Onset  . Coronary artery disease Father 74  . Heart attack Father   . Coronary artery disease Brother 55       CABG in 2000  . Deep vein thrombosis Brother   . Colon cancer Neg Hx   . Esophageal cancer Neg Hx   . Rectal cancer Neg Hx   . Stomach cancer Neg Hx     Social History   Tobacco Use  . Smoking status: Current Every Day Smoker    Packs/day: 0.50    Years: 56.00    Pack years: 28.00    Types: Cigarettes    Last attempt to quit: 03/25/2016  Years since quitting: 3.3  . Smokeless tobacco: Former Systems developer    Types: Chew  Substance Use Topics  . Alcohol use: Yes    Alcohol/week: 1.0 standard drinks    Types: 1 Standard drinks or equivalent per week    Comment: 09/26/2015 "varies; might have a few drinks q 2 months"   ROS  Review of Systems  Cardiovascular: Positive for claudication (hip pain  bilateral) and dyspnea on exertion. Negative for chest pain and leg swelling.  Musculoskeletal: Positive for arthritis and back pain.  Gastrointestinal: Negative for melena.   Objective  Blood pressure 120/65, pulse (!) 50, temperature 97.8 F (36.6 C), temperature source Temporal, resp. rate 16, height 5\' 9"  (1.753 m), weight 192 lb (87.1 kg), SpO2 98 %.  Vitals with BMI 07/26/2019 06/15/2019 04/05/2019  Height 5\' 9"  5\' 9"  5\' 9"   Weight 192 lbs 189 lbs 190 lbs  BMI 28.34 [id number]  Systolic 123456 [id number]  Diastolic 65 59 60  Pulse 50 55 54     Physical Exam  Constitutional: He appears well-developed and well-nourished.  Cardiovascular: Normal rate, regular rhythm and normal heart sounds.  Pulses:      Carotid pulses are 2+ on the  right side with bruit and 2+ on the left side.      Femoral pulses are 2+ on the right side with bruit and 1+ on the left side with bruit.      Popliteal pulses are 1+ on the right side and 1+ on the left side.       Dorsalis pedis pulses are 2+ on the right side and 1+ on the left side.       Posterior tibial pulses are 0 on the right side and 0 on the left side.  No JVD. No leg edema. Superficial varicosities noted.   Pulmonary/Chest: He has rales (bialteral left base worse than right. Diffuse scattered).  Abdominal: Soft. Bowel sounds are normal.   Laboratory examination:   Recent Labs    10/04/18 0937 01/05/19 0922 06/15/19 1052  NA 140 141 143  K 4.1 3.7 3.9  CL 107 106 108  CO2 27 29 29   GLUCOSE 119* 124* 168*  BUN 20 16 15   CREATININE 1.07 1.09 1.06  CALCIUM 8.8* 8.4* 8.9  GFRNONAA >60 >60 >60  GFRAA >60 >60 >60   CrCl cannot be calculated (Patient's most recent lab result is older than the maximum 21 days allowed.).  CMP Latest Ref Rng & Units 06/15/2019 01/05/2019 10/04/2018  Glucose 70 - 99 mg/dL 168(H) 124(H) 119(H)  BUN 8 - 23 mg/dL 15 16 20   Creatinine 0.61 - 1.24 mg/dL 1.06 1.09 1.07  Sodium 135 - 145 mmol/L 143 141 140  Potassium 3.5 - 5.1 mmol/L 3.9 3.7 4.1  Chloride 98 - 111 mmol/L 108 106 107  CO2 22 - 32 mmol/L 29 29 27   Calcium 8.9 - 10.3 mg/dL 8.9 8.4(L) 8.8(L)  Total Protein 6.5 - 8.1 g/dL 6.3(L) 5.7(L) 6.2(L)  Total Bilirubin 0.3 - 1.2 mg/dL 0.6 0.5 0.6  Alkaline Phos 38 - 126 U/L 75 78 81  AST 15 - 41 U/L 21 19 17   ALT 0 - 44 U/L 21 17 15    CBC Latest Ref Rng & Units 06/15/2019 01/05/2019 10/04/2018  WBC 4.0 - 10.5 K/uL 8.0 8.8 8.5  Hemoglobin 13.0 - 17.0 g/dL 14.5 14.0 14.9  Hematocrit 39.0 - 52.0 % 44.0 42.7 45.5  Platelets 150 - 400 K/uL 230 212 229   Lipid  Panel     Component Value Date/Time   CHOL 131 04/06/2019 1002   TRIG 148 04/06/2019 1002   HDL 36 (L) 04/06/2019 1002   CHOLHDL 2.9 02/07/2018 0915   CHOLHDL 3.0 09/06/2012 0455   VLDL  36 09/06/2012 0455   LDLCALC 69 04/06/2019 1002   HEMOGLOBIN A1C Lab Results  Component Value Date   HGBA1C 6.0 (H) 04/06/2019   MPG 140 (H) 09/05/2012   TSH Recent Labs    04/06/19 1002  TSH 2.430   Medications and allergies   Allergies  Allergen Reactions  . Other Anaphylaxis, Shortness Of Breath and Swelling    [place] nuts- "will kill me"  . Clarithromycin Nausea Only  . Fenofibrate Micronized Other (See Comments)    Caused weakness/lethargy  . Metoprolol Other (See Comments)    Fatigue and malaise  . Tricor [Fenofibrate] Other (See Comments)    Tired/weak  . Fish Oil Rash  . Levofloxacin Other (See Comments)    Doesn't remember  . Meloxicam Other (See Comments)    Achy, fatigue  . Metaxalone Other (See Comments)    Doesn't remember  . Niaspan [Niacin] Other (See Comments)    Doesn't remember  . Sulfadiazine Hives and Rash     Current Outpatient Medications  Medication Instructions  . aspirin EC 81 mg, Oral, Daily  . BYSTOLIC 5 MG tablet TAKE 1/2 TABLET ONCE DAILY  . cetirizine (ZYRTEC) 10 mg, Oral, Daily  . cholestyramine light (PREVALITE) 4 g, Oral, 2 times daily  . cyclobenzaprine (FLEXERIL) 10 mg, Oral, 3 times daily PRN  . isosorbide mononitrate (IMDUR) 30 MG 24 hr tablet TAKE 1/2 TABLET BY MOUTH EVERY DAY  . levothyroxine (SYNTHROID) 25 mcg, Oral, Daily before breakfast  . loperamide (IMODIUM A-D) 2 mg, Oral, 4 times daily PRN  . losartan (COZAAR) 50 mg, Oral, Daily  . Melatonin 12 MG TABS Oral  . Nebulizers (COMPRESSOR/NEBULIZER) MISC Use every 6 hours if needed  . nitroGLYCERIN (NITROSTAT) 0.4 MG SL tablet PLACE 1 TABLET UNDER THE TONGUE, MAY REPEAT EVERY 5 MINUTES MAX 3 TABLETS  . pantoprazole (PROTONIX) 40 mg, Oral, 2 times daily  . rivaroxaban (XARELTO) 20 mg, Oral, Daily with supper  . rosuvastatin (CRESTOR) 20 MG tablet TAKE ONE TABLET BY MOUTH EVERY DAY  . vitamin C 1,000 mg, Oral, Daily  . Vitamin D, Cholecalciferol, 50 MCG (2000 UT) CAPS  Oral  . Zinc Sulfate (ZINC 15 PO) Oral  . zolpidem (AMBIEN) 5-10 mg, Oral, Daily at bedtime   Radiology:   No results found.  Cardiac Studies:   Peripheral arteriogram: Stenting to his left SFA in 2004 and right SFA in 2008.  Stenting of left external iliac artery, right common femoral artery endarterectomy and patch angioplasty in August 2017 with staged right SFA atherectomy.  Coronary angiogram and stenting to his LAD and RCA  in 1997, 1998, and in 2004 to his proximal left circumflex coronary artery.  Due to recurrent restenosis underwent CABG on 09/12/2012 with off-pump LIMA to LAD and SVG to OM1.  CABG  by Dr. Roxan Hockey on 09/12/2012 with an off-pump LIMA to LAD (Occluded by angoigram in 2016) and SVG to circumflex marginal 1 vessel.  Coronary angiogram 07/24/2014: High-grade stenosis to the proximal LAD, patent circumflex stent, patent SVG to OM1 however LIMA to LAD occluded.  Successful stenting to proximal LAD with 3.0 x 34 mm resolute DES, postdilated to 3.4 mm.  Lexiscan Myoview stress test 10/22/2016:  Nuclear stress EF: 58%.  The left  ventricular ejection fraction is normal (55-65%).  There was no ST segment deviation noted during stress.  The study is normal.  This is a low risk study.  Compared to study of 2016, the large anterior and apical defect has resolved.  Echocardiogram 05/25/2018: Normal LV size, normal wall motion, EF [id number], grade 1 diastolic dysfunction. Mild tricuspid regurgitation.  No pulmonary hypertension.  ABI 11/02/2018: Normal ABI bilaterally.  ABI/TBIToday's ABIToday's TBIPrevious ABIPrevious TBI  +-------+-----------+-----------+------------+------------+  Right 1.07    0.81    1.0     0.82      +-------+-----------+-----------+------------+------------+  Left  0.99    0.85    1.06    0.82      +-------+-----------+-----------+------------+------------+   Lower extremity arterial duplex  11/02/2018: Right: Atherosclerosis throughout the right lower extremity without focal stenosis.  Patent mid-distal SFA stent with 1-49% stenosis.   Left: Atherosclerosis throughout the left lower extremity.  Patent distal SFA stent with 1-49% stenosis.   Abdominal aortic and iliac duplex 11/03/2018:  Stenosis: +--------------------+-------------+---------------+  Location      Stenosis   Stent       +--------------------+-------------+---------------+  Right Common Iliac <50% stenosis          +--------------------+-------------+---------------+  Left Common Iliac  <50% stenosis          +--------------------+-------------+---------------+  Right External Iliac<50% stenosis          +--------------------+-------------+---------------+  Left External Iliac        50-99% stenosis  +--------------------+-------------+---------------+   EKG:   EKG 07/26/2019: Marked sinus bradycardia at 48 bpm, normal axis.  Borderline low voltage complexes.  No evidence of ischemia.    Assessment     ICD-10-CM   1. Essential hypertension  I10 EKG 12-Lead  2. Coronary artery disease involving native coronary artery of native heart without angina pectoris  I25.10 PCV MYOCARDIAL PERFUSION WITH LEXISCAN    PCV ECHOCARDIOGRAM COMPLETE  3. Peripheral artery disease  I73.9 PCV CAROTID DUPLEX (BILATERAL)  4. Hypercholesteremia  E78.00   5. History of pulmonary embolism  Z86.711   6. Current use of long term anticoagulation  Z79.01   7. Right carotid bruit  R09.89 PCV CAROTID DUPLEX (BILATERAL)     No orders of the defined types were placed in this encounter.   Medications Discontinued During This Encounter  Medication Reason  . Probiotic Product (PROBIOTIC PO) Patient Preference  . triamcinolone cream (KENALOG) 0.1 % Error  . cholestyramine (QUESTRAN) 4 g packet Error  . albuterol (PROAIR HFA) 108 (90 Base) MCG/ACT inhaler Patient Preference   . albuterol (PROVENTIL) (2.5 MG/3ML) 0.083% nebulizer solution Patient Preference    Recommendations:   Brian Brock  is a 74 y.o. Caucasian male with CAD with multiple angioplasties over the years, due to restenosis underwent CABG on 09/12/2012.  Past medical history significant for hypertension, hyperglycemia, hyperlipidemia, hypothyroidism, peripheral vascular disease with bilateral SFA stenting and angioplasty, right femoral endarterectomy and left iliac artery stent in past, spontaneous DVT and pulmonary embolism in January 2020 and has been on Xarelto since then.  He has chronic dyspnea on exertion and COPD with ongoing tobacco use, quit a week ago on 07/17/2019.  His concerns for progression of coronary disease in view of dyspnea on exertion, EKG is unremarkable, cardiac exam is normal.  We will schedule him for Schedule for a Lexiscan Sestamibi stress test to evaluate for myocardial ischemia. Patient unable to do treadmill stress testing due to dyspnea and hip claudication.  We will also get  an echocardiogram to exclude pulmonary hypertension and RV dysfunction along with evaluation of LV function.  Blood pressure was uncontrolled until recently with increasing Bystolic now well controlled.  Continue the same.  With regard to bilateral hip pain, I suspect he probably has significant claudication from iliac artery stenosis.  He has had multiple lower extremity and also abdominal duplex, best option is to proceed directly with angiography.  I reviewed all his medical records which are extensive, and make disposition to him on his next office visit.  He has right carotid bruit, will obtain carotid duplex.  With regard to hyperlipidemia, lipids are well controlled.  He has now quit smoking and have congratulated him.  He is also tolerating Xarelto that was started for pulmonary embolism, continue the same.  His external labs reviewed.  I will see him back after the test.  Adrian Prows, MD,  [facility] 07/26/2019, 10:01 PM [facility] [place] Office: [phone]

## 2019-08-07 ENCOUNTER — Ambulatory Visit: Payer: Medicare Other

## 2019-08-07 ENCOUNTER — Telehealth: Payer: Self-pay

## 2019-08-07 ENCOUNTER — Other Ambulatory Visit: Payer: Self-pay

## 2019-08-07 DIAGNOSIS — R0989 Other specified symptoms and signs involving the circulatory and respiratory systems: Secondary | ICD-10-CM

## 2019-08-07 DIAGNOSIS — I2510 Atherosclerotic heart disease of native coronary artery without angina pectoris: Secondary | ICD-10-CM

## 2019-08-07 DIAGNOSIS — I6523 Occlusion and stenosis of bilateral carotid arteries: Secondary | ICD-10-CM

## 2019-08-07 DIAGNOSIS — I739 Peripheral vascular disease, unspecified: Secondary | ICD-10-CM

## 2019-08-14 ENCOUNTER — Telehealth: Payer: Self-pay

## 2019-08-14 NOTE — Telephone Encounter (Signed)
Pt called stating that these test listed below should be scheduled in are office instead of at [facility], based on a conversation you had with the patient. They were ordered by Dr. [name] and are scheduled foro June 2021. Please Advise  ABI w/wo TBI LEA Duplex  AORTA/IVC/ILIACS

## 2019-08-15 NOTE — Telephone Encounter (Signed)
Agree. But I want to see first. Please schedule an appointment with me in the next month or so so I can discuss appropriate action and tests needed

## 2019-08-15 NOTE — Telephone Encounter (Signed)
Lmom for him to schedule appt.

## 2019-08-16 ENCOUNTER — Ambulatory Visit: Payer: Medicare Other | Admitting: Physician Assistant

## 2019-08-29 ENCOUNTER — Encounter: Payer: Self-pay | Admitting: Cardiology

## 2019-08-29 ENCOUNTER — Ambulatory Visit: Payer: Medicare Other | Admitting: Cardiology

## 2019-08-29 ENCOUNTER — Other Ambulatory Visit: Payer: Self-pay

## 2019-08-29 VITALS — BP 119/57 | HR 54 | Temp 98.2°F | Resp 16 | Ht 69.0 in | Wt 192.5 lb

## 2019-08-29 DIAGNOSIS — I2510 Atherosclerotic heart disease of native coronary artery without angina pectoris: Secondary | ICD-10-CM

## 2019-08-29 DIAGNOSIS — I10 Essential (primary) hypertension: Secondary | ICD-10-CM

## 2019-08-29 DIAGNOSIS — I739 Peripheral vascular disease, unspecified: Secondary | ICD-10-CM

## 2019-08-29 DIAGNOSIS — Z7901 Long term (current) use of anticoagulants: Secondary | ICD-10-CM

## 2019-08-29 DIAGNOSIS — E7800 Pure hypercholesterolemia, unspecified: Secondary | ICD-10-CM

## 2019-08-29 NOTE — Progress Notes (Signed)
Primary Physician/Referring:  [name], MD  Patient ID: Brian Brock, male    DOB: 1945/08/29, 74 y.o.   MRN: [id number]  Chief Complaint  Patient presents with  . Coronary Artery Disease  . Claudication    follow up   HPI:    Brian Brock  is a 74 y.o. Caucasian male with CAD with multiple angioplasties over the years, due to restenosis underwent CABG on 09/12/2012 but had recurrence of angina and LIMA occluded needing LAD stenting in 2016.  Past medical history significant for hypertension, hyperglycemia, hyperlipidemia, hypothyroidism, peripheral vascular disease with bilateral SFA stenting and angioplasty, right femoral endarterectomy and left iliac artery stent in past, spontaneous DVT and pulmonary embolism in January 2020 and has been on Xarelto since then.  He has chronic dyspnea on exertion and COPD with ongoing tobacco use, had quit  on 07/17/2019 but started to smoke again.  He continues to have severe bilateral hip pain even with minimal activity and states he has reduced his activity and now feels he has lost his endurance. He has not noticed any ulceration or changes in his coloration of his skin. He also has mild chronic dyspnea and cough and has occasional twinges of chest pain randomly lasting <1 min.  He underwent carotid duplex, nuclear stress test and echocardiogram and presents for follow-up.  No new symptomatology.  Past Medical History:  Diagnosis Date  . AF (atrial fibrillation) ([place])    happened after CABG, has not had it since  . Allergic rhinitis   . Allergy   . Anginal pain ([place]) 1997  . Arthritis    "lower back" (09/26/2015)  . Childhood asthma   . Chronic bronchitis ([place])    Yearly  . COPD (chronic obstructive pulmonary disease) ([place])   . Coronary artery disease    Multiple stents  . Dyslipidemia   . Dysrhythmia   . Femoral hernia of right side 12/28/2016  . GERD (gastroesophageal reflux disease)   . History of hiatal hernia   .  Hyperlipemia   . Hypertension   . Hypothyroidism   . Irritable bowel syndrome   . Myocardial infarct ([place]) 1997   "mild"  . PE (pulmonary thromboembolism) ([place]) 05/2018  . Peripheral vascular disease ([place])    prior stenting  . Restless legs    Past Surgical History:  Procedure Laterality Date  . CARDIAC CATHETERIZATION  2004   had remote tandem stents in 1997 and 1998 for LAD lesions, RCA stent in 1997 , midprto=ion, and a proximal RCA  SENT IN 1999, all DES; some were IVIS  guide.  [name] CARDIAC CATHETERIZATION  09/07/2012   demonstrated severe 2- vessel disease,patent LAD,patient circ and patent right stents, but 90% ostial LAD whic was new.  . CAROTID DOPPLER  100/24/11   RGT BULB and PROXIMAL ICA 0-49%; LFT. ICA  0-49 ; RGT SUBCLAVIAN ARTERY < 50% ; LFT SUBCLAVIAN normal patency; RGT  and LFT  VERTEBRAL ARTERIES  . COLONOSCOPY  11/06/2016   Diverticulosis of colon. Internal hemorrhoids.   . CORONARY ANGIOPLASTY    . CORONARY ANGIOPLASTY WITH STENT PLACEMENT  '97, '98, '04, '06, '08, '10   seven cardiac stents  . CORONARY ARTERY BYPASS GRAFT N/A 09/12/2012   Procedure: OFF PUMP CORONARY ARTERY BYPASS GRAFTING (CABG);  Surgeon: Melrose Nakayama, MD;  Location: [facility];  Service: Open Heart Surgery;  Laterality: N/A;  Times two using left internal mammary artery and endoscopically harvested right saphenous vein  ;LIMA-LAD and  SVG -OM1  . DOPPLER ECHOCARDIOGRAPHY  11/23/2012,09/30/2010    LV EF 50-55%----09/30/2010-LV EF = >55%  . ENDARTERECTOMY FEMORAL Right 12/19/2015   Procedure: ENDARTERECTOMY RIGHT FEMORAL ARTERY;  Surgeon: Serafina Mitchell, MD;  Location: [facility];  Service: Vascular;  Laterality: Right;  . event monitor  11/04/2012-11/17/2012   pt denies this hx on 07/24/2014  . FEMORAL ARTERY STENT Bilateral 7/06  8/08   Lt SFA '06, Rt SFA 8/08  . HERNIA REPAIR  10/2008   "repaired w/gallbladder OR"  . HERNIA REPAIR  07/2016  . INGUINAL HERNIA REPAIR Right 12/28/2016   Procedure: OPEN  REPAIR RIGHT GROIN HERNIA WITH MESH;  Surgeon: Fanny Skates, MD;  Location: [facility];  Service: General;  Laterality: Right;  . LAPAROSCOPIC CHOLECYSTECTOMY  June 2010  . LEFT HEART CATHETERIZATION WITH CORONARY ANGIOGRAM N/A 09/07/2012   Procedure: LEFT HEART CATHETERIZATION WITH CORONARY ANGIOGRAM;  Surgeon: [place] Sine, MD;  Location: [facility] CATH [facility];  Service: Cardiovascular;  Laterality: N/A;  . LEFT HEART CATHETERIZATION WITH CORONARY/GRAFT ANGIOGRAM N/A 07/24/2014   Procedure: LEFT HEART CATHETERIZATION WITH Beatrix Fetters;  Surgeon: [place] Sine, MD; LAD 70%, 50% ISR, mLAD 99%, CFX 30%, patent stent, RCA patent, LIMA-LAD occluded, SVG-OM 1 patent, EF 50-55%  . LEXISCAN STRESS TEST  09/06/2012  . LOWER ARTERIAL DOPPLER  03/25/2012   FGT CIA 0-49%; RGT CFA AND SFA AT STENT 0-49%; RGT DISTAL SFA = <50%; LFT. EIA  50-69%; LFT. CFA 0-49% ;;LFT. SFA  AT STENT NORMAL PATENCY  . NM MYOVIEW LTD  05/06/2012   EF -STUDY NOT GATED, NORMAL STRESS NUCLEAR ,NORMAL LV FUNCTION  . PATCH ANGIOPLASTY Right 12/19/2015   Procedure: PATCH ANGIOPLASTY RIGHT FEMORAL ARTERY;  Surgeon: Serafina Mitchell, MD;  Location: [facility];  Service: Vascular;  Laterality: Right;  . PERCUTANEOUS CORONARY STENT INTERVENTION (PCI-S) Right 07/24/2014   Procedure: PERCUTANEOUS CORONARY STENT INTERVENTION (PCI-S);  Surgeon: [place] Sine, MD; ostial LAD PTCA with angioscope scoring balloon, 3.0 x 34 mm Resolute DES extending from the ostium to the mid LAD, covering all significant stenoses   . PERIPHERAL VASCULAR CATHETERIZATION N/A 09/26/2015   Procedure: Abdominal Aortogram w/Lower Extremity;  Surgeon: Lorretta Harp, MD;  Location: [facility];  Service: Cardiovascular;  Laterality: N/A;  . PERIPHERAL VASCULAR CATHETERIZATION Right 02/20/2016   Procedure: Peripheral Vascular Atherectomy;  Surgeon: Lorretta Harp, MD;  Location: [facility];  Service: Cardiovascular;  Laterality: Right;  SFA  . PERIPHERAL VASCULAR  CATHETERIZATION Right 02/20/2016   Procedure: Peripheral Vascular Balloon Angioplasty;  Surgeon: Lorretta Harp, MD;  Location: [facility];  Service: Cardiovascular;  Laterality: Right;  SFA  . TONSILLECTOMY  1950's  . UPPER GASTROINTESTINAL ENDOSCOPY     Family History  Problem Relation Age of Onset  . Coronary artery disease Father 41  . Heart attack Father   . Coronary artery disease Brother 52       CABG in 2000  . Deep vein thrombosis Brother   . Colon cancer Neg Hx   . Esophageal cancer Neg Hx   . Rectal cancer Neg Hx   . Stomach cancer Neg Hx     Social History   Tobacco Use  . Smoking status: Current Some Day Smoker    Packs/day: 0.50    Years: 56.00    Pack years: 28.00    Types: Cigarettes    Last attempt to quit: 03/25/2016    Years since quitting: 3.4  . Smokeless tobacco:  Current User    Types: Chew  Substance Use Topics  . Alcohol use: Yes    Alcohol/week: 1.0 standard drinks    Types: 1 Standard drinks or equivalent per week    Comment: 09/26/2015 "varies; might have a few drinks q 2 months"   ROS  Review of Systems  Cardiovascular: Positive for claudication (hip pain  bilateral) and dyspnea on exertion. Negative for chest pain and leg swelling.  Musculoskeletal: Positive for arthritis and back pain.  Gastrointestinal: Negative for melena.   Objective  Blood pressure (!) 119/57, pulse (!) 54, temperature 98.2 F (36.8 C), temperature source Temporal, resp. rate 16, height 5\' 9"  (1.753 m), weight 192 lb 8 oz (87.3 kg), SpO2 96 %.  Vitals with BMI 08/29/2019 07/26/2019 06/15/2019  Height 5\' 9"  5\' 9"  5\' 9"   Weight 192 lbs 8 oz 192 lbs 189 lbs  BMI 28.41 [id number]  Systolic 123456 123456 [id number]  Diastolic 57 65 59  Pulse 54 50 55     Physical Exam  Constitutional: He appears well-developed and well-nourished.  Cardiovascular: Normal rate, regular rhythm and normal heart sounds.  Pulses:      Carotid pulses are 2+ on the right side with bruit and 2+ on  the left side.      Femoral pulses are 2+ on the right side with bruit and 1+ on the left side with bruit.      Popliteal pulses are 1+ on the right side and 1+ on the left side.       Dorsalis pedis pulses are 2+ on the right side and 1+ on the left side.       Posterior tibial pulses are 0 on the right side and 0 on the left side.  No JVD. No leg edema. Superficial varicosities noted.   Pulmonary/Chest: He has rales (bialteral left base worse than right. Diffuse scattered).  Abdominal: Soft. Bowel sounds are normal.   Laboratory examination:   Recent Labs    10/04/18 0937 01/05/19 0922 06/15/19 1052  NA 140 141 143  K 4.1 3.7 3.9  CL 107 106 108  CO2 27 29 29   GLUCOSE 119* 124* 168*  BUN 20 16 15   CREATININE 1.07 1.09 1.06  CALCIUM 8.8* 8.4* 8.9  GFRNONAA >60 >60 >60  GFRAA >60 >60 >60   CrCl cannot be calculated (Patient's most recent lab result is older than the maximum 21 days allowed.).  CMP Latest Ref Rng & Units 06/15/2019 01/05/2019 10/04/2018  Glucose 70 - 99 mg/dL 168(H) 124(H) 119(H)  BUN 8 - 23 mg/dL 15 16 20   Creatinine 0.61 - 1.24 mg/dL 1.06 1.09 1.07  Sodium 135 - 145 mmol/L 143 141 140  Potassium 3.5 - 5.1 mmol/L 3.9 3.7 4.1  Chloride 98 - 111 mmol/L 108 106 107  CO2 22 - 32 mmol/L 29 29 27   Calcium 8.9 - 10.3 mg/dL 8.9 8.4(L) 8.8(L)  Total Protein 6.5 - 8.1 g/dL 6.3(L) 5.7(L) 6.2(L)  Total Bilirubin 0.3 - 1.2 mg/dL 0.6 0.5 0.6  Alkaline Phos 38 - 126 U/L 75 78 81  AST 15 - 41 U/L 21 19 17   ALT 0 - 44 U/L 21 17 15    CBC Latest Ref Rng & Units 06/15/2019 01/05/2019 10/04/2018  WBC 4.0 - 10.5 K/uL 8.0 8.8 8.5  Hemoglobin 13.0 - 17.0 g/dL 14.5 14.0 14.9  Hematocrit 39.0 - 52.0 % 44.0 42.7 45.5  Platelets 150 - 400 K/uL 230 212 229   Lipid Panel  Component Value Date/Time   CHOL 131 04/06/2019 1002   TRIG 148 04/06/2019 1002   HDL 36 (L) 04/06/2019 1002   CHOLHDL 2.9 02/07/2018 0915   CHOLHDL 3.0 09/06/2012 0455   VLDL 36 09/06/2012 0455   LDLCALC  69 04/06/2019 1002   HEMOGLOBIN A1C Lab Results  Component Value Date   HGBA1C 6.0 (H) 04/06/2019   MPG 140 (H) 09/05/2012   TSH Recent Labs    04/06/19 1002  TSH 2.430   Medications and allergies   Allergies  Allergen Reactions  . Other Anaphylaxis, Shortness Of Breath and Swelling    [place] nuts- "will kill me"  . Clarithromycin Nausea Only  . Fenofibrate Micronized Other (See Comments)    Caused weakness/lethargy  . Metoprolol Other (See Comments)    Fatigue and malaise  . Tricor [Fenofibrate] Other (See Comments)    Tired/weak  . Fish Oil Rash  . Levofloxacin Other (See Comments)    Doesn't remember  . Meloxicam Other (See Comments)    Achy, fatigue  . Metaxalone Other (See Comments)    Doesn't remember  . Niaspan [Niacin] Other (See Comments)    Doesn't remember  . Sulfadiazine Hives and Rash     Current Outpatient Medications  Medication Instructions  . aspirin EC 81 mg, Oral, Daily  . BYSTOLIC 5 MG tablet TAKE 1/2 TABLET ONCE DAILY  . cetirizine (ZYRTEC) 10 mg, Oral, Daily  . cholestyramine light (PREVALITE) 4 g, Oral, 2 times daily  . cyclobenzaprine (FLEXERIL) 10 mg, Oral, 3 times daily PRN  . isosorbide mononitrate (IMDUR) 30 MG 24 hr tablet TAKE 1/2 TABLET BY MOUTH EVERY DAY  . levothyroxine (SYNTHROID) 25 mcg, Oral, Daily before breakfast  . loperamide (IMODIUM A-D) 2 mg, Oral, 4 times daily PRN  . losartan (COZAAR) 50 mg, Oral, Daily  . Melatonin 12 MG TABS Oral  . Nebulizers (COMPRESSOR/NEBULIZER) MISC Use every 6 hours if needed  . nitroGLYCERIN (NITROSTAT) 0.4 MG SL tablet PLACE 1 TABLET UNDER THE TONGUE, MAY REPEAT EVERY 5 MINUTES MAX 3 TABLETS  . pantoprazole (PROTONIX) 40 mg, Oral, 2 times daily  . rivaroxaban (XARELTO) 20 mg, Oral, Daily with supper  . rosuvastatin (CRESTOR) 20 MG tablet TAKE ONE TABLET BY MOUTH EVERY DAY  . vitamin C 1,000 mg, Oral, Daily  . Vitamin D, Cholecalciferol, 50 MCG (2000 UT) CAPS Oral  . Zinc Sulfate (ZINC 15  PO) Oral  . zolpidem (AMBIEN) 5-10 mg, Oral, Daily at bedtime   Radiology:   No results found.  Cardiac Studies:    Coronary angiogram and stenting to his LAD and RCA  in 1997, 1998, and in 2004 to his proximal left circumflex coronary artery.  Due to recurrent restenosis underwent CABG on 09/12/2012 with off-pump LIMA to LAD and SVG to OM1.  CABG  by Dr. Roxan Hockey on 09/12/2012 with an off-pump LIMA to LAD (Occluded by angoigram in 2016) and SVG to circumflex marginal 1 vessel.  Coronary angiogram 07/24/2014: High-grade stenosis to the proximal LAD, patent circumflex stent, patent SVG to OM1 however LIMA to LAD occluded.  Successful stenting to proximal LAD with 3.0 x 34 mm resolute DES, postdilated to 3.4 mm.  Peripheral arteriogram: Stenting to his left SFA in 2004 and right SFA in 2008.  Stenting of left external iliac artery, right common femoral artery endarterectomy and patch angioplasty in August 2017 with staged right SFA atherectomy.  Abdominal aortic and iliac duplex 11/03/2018: Stenosis: +--------------------+-------------+---------------+  Location      Stenosis  Stent       +--------------------+-------------+---------------+  Right Common Iliac <50% stenosis          +--------------------+-------------+---------------+  Left Common Iliac  <50% stenosis          +--------------------+-------------+---------------+  Right External Iliac<50% stenosis          +--------------------+-------------+---------------+  Left External Iliac        50-99% stenosis  +--------------------+-------------+---------------+   Lower extremity arterial duplex 11/02/2018: Right: Atherosclerosis throughout the right lower extremity without focal stenosis.  Patent mid-distal SFA stent with 1-49% stenosis.  Left: Atherosclerosis throughout the left lower extremity.  Patent distal SFA stent with 1-49% stenosis.    ABI 11/02/2018:  Normal ABI bilaterally. ABI/TBIToday's ABIToday's TBIPrevious ABIPrevious TBI  +-------+-----------+-----------+------------+------------+  Right 1.07    0.81    1.0     0.82      +-------+-----------+-----------+------------+------------+  Left  0.99    0.85    1.06    0.82      +-------+-----------+-----------+------------+------------+   Carotid artery duplex  08/07/2019:  Stenosis in the right internal carotid artery (16-49%). Stenosis in the  right external carotid artery (<50%).  Stenosis in the left internal carotid artery (16-49%). Stenosis in the  left common carotid artery (<50%).  Antegrade right vertebral artery flow. Antegrade left vertebral artery  flow.  Follow up in one year is appropriate if clinically indicated.   Echocardiogram 08/07/2019:  Normal LV systolic function with visual EF 50-55%. Left ventricle cavity  is normal in size. Normal global wall motion. No obvious regional wall  motion abnormalities. Mild concentric hypertrophy of the left ventricle.  Indeterminate diastolic filling pattern, normal LAP. Calculated EF 50%.  Left atrial cavity is severely dilated.  Visually, right atrial cavity is mildly dilated.  Mild mitral regurgitation.  Moderate tricuspid regurgitation. No evidence of pulmonary hypertension. RVSP measures 33 mmHg.  Mild pulmonic regurgitation.  IVC is dilated with a respiratory response of >50%.  No significant change compared to previous study 05/25/2018.   Lexiscan (Walking with mod Bruce)Tetrofosmin Stress Test  08/07/2019: Nondiagnostic ECG stress. Perfusion imaging study is abnormal.  Mild inferior, inferolateral small sized defect consistent with ischemia. Left ventricular systolic function is mildly depressed, LVEF calculated at 49% with inferior hypokinesis. Compared to 10/22/2016 report- normal study.  Intermediate risk study.   EKG:   EKG 07/26/2019: Marked sinus bradycardia at  48 bpm, normal axis.  Borderline low voltage complexes.  No evidence of ischemia.    Assessment     ICD-10-CM   1. Coronary artery disease involving native coronary artery of native heart without angina pectoris  I25.10   2. Claudication in peripheral vascular disease (HCC)  I73.9 PCV LOWER ARTERIAL (BILATERAL)  3. Hypercholesteremia  E78.00   4. Essential hypertension  I10   5. Current use of long term anticoagulation  Z79.01      No orders of the defined types were placed in this encounter.   There are no discontinued medications.  Recommendations:   Brian Brock  is a 74 y.o. Caucasian male with CAD with multiple angioplasties over the years, due to restenosis underwent CABG on 09/12/2012 with LIMA to LAD and SVG to OM but had recurrence of angina and LIMA occluded needing LAD stenting in 2016.  Past medical history significant for hypertension, hyperglycemia, hyperlipidemia, hypothyroidism, peripheral vascular disease with bilateral SFA stenting and angioplasty, right femoral endarterectomy and left iliac artery stent in past, spontaneous DVT and pulmonary embolism in January 2020 and has been on  Xarelto since then.  He has chronic dyspnea on exertion and COPD with ongoing tobacco use, had quit  on 07/17/2019 but started to smoke again.  I reviewed the results of the echocardiogram and the stress test with the patient, he has normal LVEF.  Very mild inferior ischemia, may represent progression of coronary artery disease in the right coronary artery.  Fortunately no significant ischemia in the LAD territory.  Overall I would recommend continued medical therapy unless he continues to exhibit clear-cut anginal symptoms.  Presently his main symptomatology and limitation is from bilateral hip claudication and also bilateral calf claudication.  I extensively discussed with him regarding prior evaluations and prior angiography and he would like to proceed with repeat angiography.  I will set  him up for lower extremity arterial duplex prior to this.  Reviewed his carotid artery duplex, mild bilateral asymptomatic disease, will recheck in a year.  Blood pressure is now well controlled on the present medical regimen, lipids are under excellent control as well.   Will schedule for peripheral arteriogram and possible angioplasty given symptoms. Patient understands the risks, benefits, alternatives including medical therapy, CT angiography. Patient understands <1-2% risk of death, embolic complications, bleeding, infection, renal failure, urgent surgical revascularization, but not limited to these and wants to proceed.  Adrian Prows, MD, [facility] 08/29/2019, 6:08 PM [facility]. [place] Office: [phone]

## 2019-08-29 NOTE — H&P (View-Only) (Signed)
Primary Physician/Referring:  [name], MD  Patient ID: Brian Brock, male    DOB: [birth year], 74 y.o.   MRN: [id number]  Chief Complaint  Patient presents with  . Coronary Artery Disease  . Claudication    follow up   HPI:    Brian Brock  is a 74 y.o. Caucasian male with CAD with multiple angioplasties over the years, due to restenosis underwent CABG on 09/12/2012 but had recurrence of angina and LIMA occluded needing LAD stenting in 2016.  Past medical history significant for hypertension, hyperglycemia, hyperlipidemia, hypothyroidism, peripheral vascular disease with bilateral SFA stenting and angioplasty, right femoral endarterectomy and left iliac artery stent in past, spontaneous DVT and pulmonary embolism in January 2020 and has been on Xarelto since then.  He has chronic dyspnea on exertion and COPD with ongoing tobacco use, had quit  on 07/17/2019 but started to smoke again.  He continues to have severe bilateral hip pain even with minimal activity and states he has reduced his activity and now feels he has lost his endurance. He has not noticed any ulceration or changes in his coloration of his skin. He also has mild chronic dyspnea and cough and has occasional twinges of chest pain randomly lasting <1 min.  He underwent carotid duplex, nuclear stress test and echocardiogram and presents for follow-up.  No new symptomatology.  Past Medical History:  Diagnosis Date  . AF (atrial fibrillation) ([place])    happened after CABG, has not had it since  . Allergic rhinitis   . Allergy   . Anginal pain ([place]) 1997  . Arthritis    "lower back" (09/26/2015)  . Childhood asthma   . Chronic bronchitis ([place])    Yearly  . COPD (chronic obstructive pulmonary disease) ([place])   . Coronary artery disease    Multiple stents  . Dyslipidemia   . Dysrhythmia   . Femoral hernia of right side 12/28/2016  . GERD (gastroesophageal reflux disease)   . History of hiatal hernia   .  Hyperlipemia   . Hypertension   . Hypothyroidism   . Irritable bowel syndrome   . Myocardial infarct ([place]) 1997   "mild"  . PE (pulmonary thromboembolism) ([place]) 05/2018  . Peripheral vascular disease ([place])    prior stenting  . Restless legs    Past Surgical History:  Procedure Laterality Date  . CARDIAC CATHETERIZATION  2004   had remote tandem stents in 1997 and 1998 for LAD lesions, RCA stent in 1997 , midprto=ion, and a proximal RCA  SENT IN 1999, all DES; some were IVIS  guide.  [name] CARDIAC CATHETERIZATION  09/07/2012   demonstrated severe 2- vessel disease,patent LAD,patient circ and patent right stents, but 90% ostial LAD whic was new.  . CAROTID DOPPLER  100/24/11   RGT BULB and PROXIMAL ICA 0-49%; LFT. ICA  0-49 ; RGT SUBCLAVIAN ARTERY < 50% ; LFT SUBCLAVIAN normal patency; RGT  and LFT  VERTEBRAL ARTERIES  . COLONOSCOPY  11/06/2016   Diverticulosis of colon. Internal hemorrhoids.   . CORONARY ANGIOPLASTY    . CORONARY ANGIOPLASTY WITH STENT PLACEMENT  '97, '98, '04, '06, '08, '10   seven cardiac stents  . CORONARY ARTERY BYPASS GRAFT N/A 09/12/2012   Procedure: OFF PUMP CORONARY ARTERY BYPASS GRAFTING (CABG);  Surgeon: Melrose Nakayama, MD;  Location: [facility];  Service: Open Heart Surgery;  Laterality: N/A;  Times two using left internal mammary artery and endoscopically harvested right saphenous vein  ;LIMA-LAD and  SVG -OM1  . DOPPLER ECHOCARDIOGRAPHY  11/23/2012,09/30/2010    LV EF 50-55%----09/30/2010-LV EF = >55%  . ENDARTERECTOMY FEMORAL Right 12/19/2015   Procedure: ENDARTERECTOMY RIGHT FEMORAL ARTERY;  Surgeon: Serafina Mitchell, MD;  Location: [facility];  Service: Vascular;  Laterality: Right;  . event monitor  11/04/2012-11/17/2012   pt denies this hx on 07/24/2014  . FEMORAL ARTERY STENT Bilateral 7/06  8/08   Lt SFA '06, Rt SFA 8/08  . HERNIA REPAIR  10/2008   "repaired w/gallbladder OR"  . HERNIA REPAIR  07/2016  . INGUINAL HERNIA REPAIR Right 12/28/2016   Procedure: OPEN  REPAIR RIGHT GROIN HERNIA WITH MESH;  Surgeon: Fanny Skates, MD;  Location: [facility];  Service: General;  Laterality: Right;  . LAPAROSCOPIC CHOLECYSTECTOMY  June 2010  . LEFT HEART CATHETERIZATION WITH CORONARY ANGIOGRAM N/A 09/07/2012   Procedure: LEFT HEART CATHETERIZATION WITH CORONARY ANGIOGRAM;  Surgeon: Troy Sine, MD;  Location[facility] C[facility];  Service: Cardiovascular;  Laterality: N/A;  . LEFT HEART CATHETERIZATION WITH CORONARY/GRAFT ANGIOGRAM N/A 07/24/2014   Procedure: LEFT HEART CATHETERIZATION WITH Beatrix Fetters;  Surgeon: Troy Sine, MD; LAD 70%, 50% ISR, mLAD 99%, CFX 30%, patent stent, RCA patent, LIMA-LAD occluded, SVG-OM 1 patent, EF 50-55%  . LEXISCAN STRESS TEST  09/06/2012  . LOWER ARTERIAL DOPPLER  03/25/2012   FGT CIA 0-49%; RGT CFA AND SFA AT STENT 0-49%; RGT DISTAL SFA = <50%; LFT. EIA  50-69%; LFT. CFA 0-49% ;;LFT. SFA  AT STENT NORMAL PATENCY  . NM MYOVIEW LTD  05/06/2012   EF -STUDY NOT GATED, NORMAL STRESS NUCLEAR ,NORMAL LV FUNCTION  . PATCH ANGIOPLASTY Right 12/19/2015   Procedure: PATCH ANGIOPLASTY RIGHT FEMORAL ARTERY;  Surgeon: Serafina Mitchell, MD;  Location: [facility];  Service: Vascular;  Laterality: Right;  . PERCUTANEOUS CORONARY STENT INTERVENTION (PCI-S) Right 07/24/2014   Procedure: PERCUTANEOUS CORONARY STENT INTERVENTION (PCI-S);  Surgeon: Troy Sine, MD; ostial LAD PTCA with angioscope scoring balloon, 3.0 x 34 mm Resolute DES extending from the ostium to the mid LAD, covering all significant stenoses   . PERIPHERAL VASCULAR CATHETERIZATION N/A 09/26/2015   Procedure: Abdominal Aortogram w/Lower Extremity;  Surgeon: Lorretta Harp, MD;  Location: [facility] CV LAB;  Service: Cardiovascular;  Laterality: N/A;  . PERIPHERAL VASCULAR CATHETERIZATION Right 02/20/2016   Procedure: Peripheral Vascular Atherectomy;  Surgeon: Lorretta Harp, MD;  Location: [facility] LAB;  Service: Cardiovascular;  Laterality: Right;  SFA  . PERIPHERAL VASCULAR  CATHETERIZATION Right 02/20/2016   Procedure: Peripheral Vascular Balloon Angioplasty;  Surgeon: Lorretta Harp, MD;  Location: [facility] CV LAB;  Service: Cardiovascular;  Laterality: Right;  SFA  . TONSILLECTOMY  1950's  . UPPER GASTROINTESTINAL ENDOSCOPY     Family History  Problem Relation Age of Onset  . Coronary artery disease Father 87  . Heart attack Father   . Coronary artery disease Brother 74       CABG in 2000  . Deep vein thrombosis Brother   . Colon cancer Neg Hx   . Esophageal cancer Neg Hx   . Rectal cancer Neg Hx   . Stomach cancer Neg Hx     Social History   Tobacco Use  . Smoking status: Current Some Day Smoker    Packs/day: 0.50    Years: 56.00    Pack years: 28.00    Types: Cigarettes    Last attempt to quit: 03/25/2016    Years since quitting: 3.4  . Smokeless tobacco:  Current User    Types: Chew  Substance Use Topics  . Alcohol use: Yes    Alcohol/week: 1.0 standard drinks    Types: 1 Standard drinks or equivalent per week    Comment: 09/26/2015 "varies; might have a few drinks q 2 months"   ROS  Review of Systems  Cardiovascular: Positive for claudication (hip pain  bilateral) and dyspnea on exertion. Negative for chest pain and leg swelling.  Musculoskeletal: Positive for arthritis and back pain.  Gastrointestinal: Negative for melena.   Objective  Blood pressure (!) 119/57, pulse (!) 54, temperature 98.2 F (36.8 C), temperature source Temporal, resp. rate 16, height 5\' 9"  (1.753 m), weight 192 lb 8 oz (87.3 kg), SpO2 96 %.  Vitals with BMI 08/29/2019 07/26/2019 06/15/2019  Height 5\' 9"  5\' 9"  5\' 9"   Weight 192 lbs 8 oz 192 lbs 189 lbs  BMI 28.41 [id number]  Systolic 123456 [id number] [id number]  Diastolic 57 65 59  Pulse 54 50 55     Physical Exam  Constitutional: He appears well-developed and well-nourished.  Cardiovascular: Normal rate, regular rhythm and normal heart sounds.  Pulses:      Carotid pulses are 2+ on the right side with bruit and 2+ on  the left side.      Femoral pulses are 2+ on the right side with bruit and 1+ on the left side with bruit.      Popliteal pulses are 1+ on the right side and 1+ on the left side.       Dorsalis pedis pulses are 2+ on the right side and 1+ on the left side.       Posterior tibial pulses are 0 on the right side and 0 on the left side.  No JVD. No leg edema. Superficial varicosities noted.   Pulmonary/Chest: He has rales (bialteral left base worse than right. Diffuse scattered).  Abdominal: Soft. Bowel sounds are normal.   Laboratory examination:   Recent Labs    10/04/18 0937 01/05/19 0922 06/15/19 1052  NA 140 141 143  K 4.1 3.7 3.9  CL 107 106 108  CO2 27 29 29   GLUCOSE 119* 124* 168*  BUN 20 16 15   CREATININE 1.07 1.09 1.06  CALCIUM 8.8* 8.4* 8.9  GFRNONAA >60 >60 >60  GFRAA >60 >60 >60   CrCl cannot be calculated (Patient's most recent lab result is older than the maximum 21 days allowed.).  CMP Latest Ref Rng & Units 06/15/2019 01/05/2019 10/04/2018  Glucose 70 - 99 mg/dL 168(H) 124(H) 119(H)  BUN 8 - 23 mg/dL 15 16 20   Creatinine 0.61 - 1.24 mg/dL 1.06 1.09 1.07  Sodium 135 - 145 mmol/L 143 141 140  Potassium 3.5 - 5.1 mmol/L 3.9 3.7 4.1  Chloride 98 - 111 mmol/L 108 106 107  CO2 22 - 32 mmol/L 29 29 27   Calcium 8.9 - 10.3 mg/dL 8.9 8.4(L) 8.8(L)  Total Protein 6.5 - 8.1 g/dL 6.3(L) 5.7(L) 6.2(L)  Total Bilirubin 0.3 - 1.2 mg/dL 0.6 0.5 0.6  Alkaline Phos 38 - 126 U/L 75 78 81  AST 15 - 41 U/L 21 19 17   ALT 0 - 44 U/L 21 17 15    CBC Latest Ref Rng & Units 06/15/2019 01/05/2019 10/04/2018  WBC 4.0 - 10.5 K/uL 8.0 8.8 8.5  Hemoglobin 13.0 - 17.0 g/dL 14.5 14.0 14.9  Hematocrit 39.0 - 52.0 % 44.0 42.7 45.5  Platelets 150 - 400 K/uL 230 212 229   Lipid Panel  Component Value Date/Time   CHOL 131 04/06/2019 1002   TRIG 148 04/06/2019 1002   HDL 36 (L) 04/06/2019 1002   CHOLHDL 2.9 02/07/2018 0915   CHOLHDL 3.0 09/06/2012 0455   VLDL 36 09/06/2012 0455   LDLCALC  69 04/06/2019 1002   HEMOGLOBIN A1C Lab Results  Component Value Date   HGBA1C 6.0 (H) 04/06/2019   MPG 140 (H) 09/05/2012   TSH Recent Labs    04/06/19 1002  TSH 2.430   Medications and allergies   Allergies  Allergen Reactions  . Other Anaphylaxis, Shortness Of Breath and Swelling    [place] nuts- "will kill me"  . Clarithromycin Nausea Only  . Fenofibrate Micronized Other (See Comments)    Caused weakness/lethargy  . Metoprolol Other (See Comments)    Fatigue and malaise  . Tricor [Fenofibrate] Other (See Comments)    Tired/weak  . Fish Oil Rash  . Levofloxacin Other (See Comments)    Doesn't remember  . Meloxicam Other (See Comments)    Achy, fatigue  . Metaxalone Other (See Comments)    Doesn't remember  . Niaspan [Niacin] Other (See Comments)    Doesn't remember  . Sulfadiazine Hives and Rash     Current Outpatient Medications  Medication Instructions  . aspirin EC 81 mg, Oral, Daily  . BYSTOLIC 5 MG tablet TAKE 1/2 TABLET ONCE DAILY  . cetirizine (ZYRTEC) 10 mg, Oral, Daily  . cholestyramine light (PREVALITE) 4 g, Oral, 2 times daily  . cyclobenzaprine (FLEXERIL) 10 mg, Oral, 3 times daily PRN  . isosorbide mononitrate (IMDUR) 30 MG 24 hr tablet TAKE 1/2 TABLET BY MOUTH EVERY DAY  . levothyroxine (SYNTHROID) 25 mcg, Oral, Daily before breakfast  . loperamide (IMODIUM A-D) 2 mg, Oral, 4 times daily PRN  . losartan (COZAAR) 50 mg, Oral, Daily  . Melatonin 12 MG TABS Oral  . Nebulizers (COMPRESSOR/NEBULIZER) MISC Use every 6 hours if needed  . nitroGLYCERIN (NITROSTAT) 0.4 MG SL tablet PLACE 1 TABLET UNDER THE TONGUE, MAY REPEAT EVERY 5 MINUTES MAX 3 TABLETS  . pantoprazole (PROTONIX) 40 mg, Oral, 2 times daily  . rivaroxaban (XARELTO) 20 mg, Oral, Daily with supper  . rosuvastatin (CRESTOR) 20 MG tablet TAKE ONE TABLET BY MOUTH EVERY DAY  . vitamin C 1,000 mg, Oral, Daily  . Vitamin D, Cholecalciferol, 50 MCG (2000 UT) CAPS Oral  . Zinc Sulfate (ZINC 15  PO) Oral  . zolpidem (AMBIEN) 5-10 mg, Oral, Daily at bedtime   Radiology:   No results found.  Cardiac Studies:    Coronary angiogram and stenting to his LAD and RCA  in 1997, 1998, and in 2004 to his proximal left circumflex coronary artery.  Due to recurrent restenosis underwent CABG on 09/12/2012 with off-pump LIMA to LAD and SVG to OM1.  CABG  by Dr. Roxan Hockey on 09/12/2012 with an off-pump LIMA to LAD (Occluded by angoigram in 2016) and SVG to circumflex marginal 1 vessel.  Coronary angiogram 07/24/2014: High-grade stenosis to the proximal LAD, patent circumflex stent, patent SVG to OM1 however LIMA to LAD occluded.  Successful stenting to proximal LAD with 3.0 x 34 mm resolute DES, postdilated to 3.4 mm.  Peripheral arteriogram: Stenting to his left SFA in 2004 and right SFA in 2008.  Stenting of left external iliac artery, right common femoral artery endarterectomy and patch angioplasty in August 2017 with staged right SFA atherectomy.  Abdominal aortic and iliac duplex 11/03/2018: Stenosis: +--------------------+-------------+---------------+  Location      Stenosis  Stent       +--------------------+-------------+---------------+  Right Common Iliac <50% stenosis          +--------------------+-------------+---------------+  Left Common Iliac  <50% stenosis          +--------------------+-------------+---------------+  Right External Iliac<50% stenosis          +--------------------+-------------+---------------+  Left External Iliac        50-99% stenosis  +--------------------+-------------+---------------+   Lower extremity arterial duplex 11/02/2018: Right: Atherosclerosis throughout the right lower extremity without focal stenosis.  Patent mid-distal SFA stent with 1-49% stenosis.  Left: Atherosclerosis throughout the left lower extremity.  Patent distal SFA stent with 1-49% stenosis.    ABI 11/02/2018:  Normal ABI bilaterally. ABI/TBIToday's ABIToday's TBIPrevious ABIPrevious TBI  +-------+-----------+-----------+------------+------------+  Right 1.07    0.81    1.0     0.82      +-------+-----------+-----------+------------+------------+  Left  0.99    0.85    1.06    0.82      +-------+-----------+-----------+------------+------------+   Carotid artery duplex  08/07/2019:  Stenosis in the right internal carotid artery (16-49%). Stenosis in the  right external carotid artery (<50%).  Stenosis in the left internal carotid artery (16-49%). Stenosis in the  left common carotid artery (<50%).  Antegrade right vertebral artery flow. Antegrade left vertebral artery  flow.  Follow up in one year is appropriate if clinically indicated.   Echocardiogram 08/07/2019:  Normal LV systolic function with visual EF 50-55%. Left ventricle cavity  is normal in size. Normal global wall motion. No obvious regional wall  motion abnormalities. Mild concentric hypertrophy of the left ventricle.  Indeterminate diastolic filling pattern, normal LAP. Calculated EF 50%.  Left atrial cavity is severely dilated.  Visually, right atrial cavity is mildly dilated.  Mild mitral regurgitation.  Moderate tricuspid regurgitation. No evidence of pulmonary hypertension. RVSP measures 33 mmHg.  Mild pulmonic regurgitation.  IVC is dilated with a respiratory response of >50%.  No significant change compared to previous study 05/25/2018.   Lexiscan (Walking with mod Bruce)Tetrofosmin Stress Test  08/07/2019: Nondiagnostic ECG stress. Perfusion imaging study is abnormal.  Mild inferior, inferolateral small sized defect consistent with ischemia. Left ventricular systolic function is mildly depressed, LVEF calculated at 49% with inferior hypokinesis. Compared to 10/22/2016 report- normal study.  Intermediate risk study.   EKG:   EKG 07/26/2019: Marked sinus bradycardia at  48 bpm, normal axis.  Borderline low voltage complexes.  No evidence of ischemia.    Assessment     ICD-10-CM   1. Coronary artery disease involving native coronary artery of native heart without angina pectoris  I25.10   2. Claudication in peripheral vascular disease (HCC)  I73.9 PCV LOWER ARTERIAL (BILATERAL)  3. Hypercholesteremia  E78.00   4. Essential hypertension  I10   5. Current use of long term anticoagulation  Z79.01      No orders of the defined types were placed in this encounter.   There are no discontinued medications.  Recommendations:   Brian Brock  is a 74 y.o. Caucasian male with CAD with multiple angioplasties over the years, due to restenosis underwent CABG on 09/12/2012 with LIMA to LAD and SVG to OM but had recurrence of angina and LIMA occluded needing LAD stenting in 2016.  Past medical history significant for hypertension, hyperglycemia, hyperlipidemia, hypothyroidism, peripheral vascular disease with bilateral SFA stenting and angioplasty, right femoral endarterectomy and left iliac artery stent in past, spontaneous DVT and pulmonary embolism in January 2020 and has been on  Xarelto since then.  He has chronic dyspnea on exertion and COPD with ongoing tobacco use, had quit  on 07/17/2019 but started to smoke again.  I reviewed the results of the echocardiogram and the stress test with the patient, he has normal LVEF.  Very mild inferior ischemia, may represent progression of coronary artery disease in the right coronary artery.  Fortunately no significant ischemia in the LAD territory.  Overall I would recommend continued medical therapy unless he continues to exhibit clear-cut anginal symptoms.  Presently his main symptomatology and limitation is from bilateral hip claudication and also bilateral calf claudication.  I extensively discussed with him regarding prior evaluations and prior angiography and he would like to proceed with repeat angiography.  I will set  him up for lower extremity arterial duplex prior to this.  Reviewed his carotid artery duplex, mild bilateral asymptomatic disease, will recheck in a year.  Blood pressure is now well controlled on the present medical regimen, lipids are under excellent control as well.   Will schedule for peripheral arteriogram and possible angioplasty given symptoms. Patient understands the risks, benefits, alternatives including medical therapy, CT angiography. Patient understands <1-2% risk of death, embolic complications, bleeding, infection, renal failure, urgent surgical revascularization, but not limited to these and wants to proceed.  Adrian Prows, MD, [facility] 08/29/2019, 6:08 PM [facility] [place] Office: [phone]

## 2019-08-31 ENCOUNTER — Ambulatory Visit: Payer: Medicare Other

## 2019-08-31 ENCOUNTER — Other Ambulatory Visit: Payer: Self-pay

## 2019-08-31 ENCOUNTER — Other Ambulatory Visit: Payer: Self-pay | Admitting: Cardiology

## 2019-08-31 DIAGNOSIS — I739 Peripheral vascular disease, unspecified: Secondary | ICD-10-CM

## 2019-08-31 DIAGNOSIS — I10 Essential (primary) hypertension: Secondary | ICD-10-CM

## 2019-09-01 LAB — BASIC METABOLIC PANEL
BUN/Creatinine Ratio: 17 (ref 10–24)
BUN: 21 mg/dL (ref 8–27)
CO2: 24 mmol/L (ref 20–29)
Calcium: 9.3 mg/dL (ref 8.6–10.2)
Chloride: 106 mmol/L (ref 96–106)
Creatinine, Ser: 1.22 mg/dL (ref 0.76–1.27)
GFR calc Af Amer: 68 mL/min/{1.73_m2} (ref >59)
GFR calc non Af Amer: 58 mL/min/{1.73_m2} — ABNORMAL LOW (ref >59)
Glucose: 100 mg/dL — ABNORMAL HIGH (ref 65–99)
Potassium: 5 mmol/L (ref 3.5–5.2)
Sodium: 143 mmol/L (ref 134–144)

## 2019-09-01 LAB — CBC
Hematocrit: 42.5 % (ref 37.5–51.0)
Hemoglobin: 14.5 g/dL (ref 13.0–17.7)
MCH: 29.5 pg (ref 26.6–33.0)
MCHC: 34.1 g/dL (ref 31.5–35.7)
MCV: 86 fL (ref 79–97)
Platelets: 217 10*3/uL (ref 150–450)
RBC: 4.92 x10E6/uL (ref 4.14–5.80)
RDW: 12.6 % (ref 11.6–15.4)
WBC: 9.9 10*3/uL (ref 3.4–10.8)

## 2019-09-05 ENCOUNTER — Other Ambulatory Visit: Payer: Self-pay

## 2019-09-05 ENCOUNTER — Encounter (HOSPITAL_COMMUNITY)
Admission: RE | Disposition: A | Discharge status: Home / Self Care | Payer: Self-pay | Source: Home / Self Care | Attending: Cardiology

## 2019-09-05 ENCOUNTER — Ambulatory Visit (HOSPITAL_COMMUNITY)
Admission: RE | Admit: 2019-09-05 | Discharge: 2019-09-05 | Disposition: A | Discharge status: Home / Self Care | Payer: Medicare Other | Attending: Cardiology | Admitting: Cardiology

## 2019-09-05 DIAGNOSIS — I739 Peripheral vascular disease, unspecified: Secondary | ICD-10-CM | POA: Diagnosis present

## 2019-09-05 DIAGNOSIS — Z7901 Long term (current) use of anticoagulants: Secondary | ICD-10-CM | POA: Diagnosis not present

## 2019-09-05 DIAGNOSIS — K219 Gastro-esophageal reflux disease without esophagitis: Secondary | ICD-10-CM | POA: Insufficient documentation

## 2019-09-05 DIAGNOSIS — Z881 Allergy status to other antibiotic agents status: Secondary | ICD-10-CM | POA: Insufficient documentation

## 2019-09-05 DIAGNOSIS — Z79890 Hormone replacement therapy (postmenopausal): Secondary | ICD-10-CM | POA: Insufficient documentation

## 2019-09-05 DIAGNOSIS — E785 Hyperlipidemia, unspecified: Secondary | ICD-10-CM | POA: Insufficient documentation

## 2019-09-05 DIAGNOSIS — Z951 Presence of aortocoronary bypass graft: Secondary | ICD-10-CM | POA: Insufficient documentation

## 2019-09-05 DIAGNOSIS — Z888 Allergy status to other drugs, medicaments and biological substances status: Secondary | ICD-10-CM | POA: Insufficient documentation

## 2019-09-05 DIAGNOSIS — I4891 Unspecified atrial fibrillation: Secondary | ICD-10-CM | POA: Diagnosis not present

## 2019-09-05 DIAGNOSIS — I70213 Atherosclerosis of native arteries of extremities with intermittent claudication, bilateral legs: Secondary | ICD-10-CM | POA: Diagnosis present

## 2019-09-05 DIAGNOSIS — Z79899 Other long term (current) drug therapy: Secondary | ICD-10-CM | POA: Insufficient documentation

## 2019-09-05 DIAGNOSIS — Z86718 Personal history of other venous thrombosis and embolism: Secondary | ICD-10-CM | POA: Insufficient documentation

## 2019-09-05 DIAGNOSIS — E7800 Pure hypercholesterolemia, unspecified: Secondary | ICD-10-CM | POA: Diagnosis not present

## 2019-09-05 DIAGNOSIS — E039 Hypothyroidism, unspecified: Secondary | ICD-10-CM | POA: Diagnosis not present

## 2019-09-05 DIAGNOSIS — F17210 Nicotine dependence, cigarettes, uncomplicated: Secondary | ICD-10-CM | POA: Diagnosis not present

## 2019-09-05 DIAGNOSIS — I10 Essential (primary) hypertension: Secondary | ICD-10-CM | POA: Insufficient documentation

## 2019-09-05 DIAGNOSIS — Z8249 Family history of ischemic heart disease and other diseases of the circulatory system: Secondary | ICD-10-CM | POA: Diagnosis not present

## 2019-09-05 DIAGNOSIS — I252 Old myocardial infarction: Secondary | ICD-10-CM | POA: Insufficient documentation

## 2019-09-05 DIAGNOSIS — Z882 Allergy status to sulfonamides status: Secondary | ICD-10-CM | POA: Diagnosis not present

## 2019-09-05 DIAGNOSIS — I2510 Atherosclerotic heart disease of native coronary artery without angina pectoris: Secondary | ICD-10-CM | POA: Diagnosis not present

## 2019-09-05 DIAGNOSIS — J449 Chronic obstructive pulmonary disease, unspecified: Secondary | ICD-10-CM | POA: Diagnosis not present

## 2019-09-05 DIAGNOSIS — Z95828 Presence of other vascular implants and grafts: Secondary | ICD-10-CM | POA: Diagnosis not present

## 2019-09-05 DIAGNOSIS — Z955 Presence of coronary angioplasty implant and graft: Secondary | ICD-10-CM | POA: Insufficient documentation

## 2019-09-05 DIAGNOSIS — Z832 Family history of diseases of the blood and blood-forming organs and certain disorders involving the immune mechanism: Secondary | ICD-10-CM | POA: Diagnosis not present

## 2019-09-05 DIAGNOSIS — Z7982 Long term (current) use of aspirin: Secondary | ICD-10-CM | POA: Insufficient documentation

## 2019-09-05 HISTORY — PX: PERIPHERAL VASCULAR INTERVENTION: CATH118257

## 2019-09-05 HISTORY — PX: LOWER EXTREMITY ANGIOGRAPHY: CATH118251

## 2019-09-05 HISTORY — PX: PERIPHERAL INTRAVASCULAR LITHOTRIPSY: CATH118324

## 2019-09-05 SURGERY — LOWER EXTREMITY ANGIOGRAPHY
Anesthesia: LOCAL

## 2019-09-05 MED ORDER — SODIUM CHLORIDE 0.9 % WEIGHT BASED INFUSION: 1.0000 mL/kg/h | INTRAVENOUS | Status: DC

## 2019-09-05 MED ORDER — SODIUM CHLORIDE 0.9 % IV SOLN: INTRAVENOUS | Status: DC

## 2019-09-05 MED ORDER — LIDOCAINE HCL (PF) 1 % IJ SOLN
INTRAMUSCULAR | Status: AC
  Filled 2019-09-05: qty 30

## 2019-09-05 MED ORDER — MIDAZOLAM HCL 2 MG/2ML IJ SOLN
INTRAMUSCULAR | Status: DC | PRN
  Administered 2019-09-05 (×2): 1 mg via INTRAVENOUS
  Administered 2019-09-05: 2 mg via INTRAVENOUS

## 2019-09-05 MED ORDER — MIDAZOLAM HCL 2 MG/2ML IJ SOLN
INTRAMUSCULAR | Status: AC
  Filled 2019-09-05: qty 2

## 2019-09-05 MED ORDER — HEPARIN (PORCINE) IN NACL 1000-0.9 UT/500ML-% IV SOLN
INTRAVENOUS | Status: DC | PRN
  Administered 2019-09-05 (×2): 500 mL

## 2019-09-05 MED ORDER — CLOPIDOGREL BISULFATE 300 MG PO TABS
300.0000 mg | ORAL_TABLET | Freq: Once | ORAL | Status: AC
  Administered 2019-09-05: 300 mg via ORAL
  Filled 2019-09-05: qty 1

## 2019-09-05 MED ORDER — SODIUM CHLORIDE 0.9 % IV SOLN: 250.0000 mL | INTRAVENOUS | Status: DC | PRN

## 2019-09-05 MED ORDER — LIDOCAINE HCL (PF) 1 % IJ SOLN
INTRAMUSCULAR | Status: DC | PRN
  Administered 2019-09-05: 15 mL

## 2019-09-05 MED ORDER — ONDANSETRON HCL 4 MG/2ML IJ SOLN: 4.0000 mg | Freq: Four times a day (QID) | INTRAMUSCULAR | Status: DC | PRN

## 2019-09-05 MED ORDER — HEPARIN SODIUM (PORCINE) 1000 UNIT/ML IJ SOLN
INTRAMUSCULAR | Status: DC | PRN
  Administered 2019-09-05: 8000 [IU] via INTRAVENOUS
  Administered 2019-09-05: 3000 [IU] via INTRAVENOUS

## 2019-09-05 MED ORDER — HYDRALAZINE HCL 20 MG/ML IJ SOLN: 5.0000 mg | INTRAMUSCULAR | Status: DC | PRN

## 2019-09-05 MED ORDER — IODIXANOL 320 MG/ML IV SOLN
INTRAVENOUS | Status: DC | PRN
  Administered 2019-09-05: 155 mL via INTRA_ARTERIAL

## 2019-09-05 MED ORDER — SODIUM CHLORIDE 0.9 % IV BOLUS
500.0000 mL | Freq: Once | INTRAVENOUS | Status: AC
  Administered 2019-09-05: 500 mL via INTRAVENOUS

## 2019-09-05 MED ORDER — FENTANYL CITRATE (PF) 100 MCG/2ML IJ SOLN
INTRAMUSCULAR | Status: DC | PRN
  Administered 2019-09-05: 25 ug via INTRAVENOUS

## 2019-09-05 MED ORDER — HEPARIN SODIUM (PORCINE) 1000 UNIT/ML IJ SOLN
INTRAMUSCULAR | Status: AC
  Filled 2019-09-05: qty 1

## 2019-09-05 MED ORDER — SODIUM CHLORIDE 0.9% FLUSH: 3.0000 mL | INTRAVENOUS | Status: DC | PRN

## 2019-09-05 MED ORDER — SODIUM CHLORIDE 0.9% FLUSH: 3.0000 mL | Freq: Two times a day (BID) | INTRAVENOUS | Status: DC

## 2019-09-05 MED ORDER — HEPARIN (PORCINE) IN NACL 1000-0.9 UT/500ML-% IV SOLN
INTRAVENOUS | Status: AC
  Filled 2019-09-05: qty 1000

## 2019-09-05 MED ORDER — FENTANYL CITRATE (PF) 100 MCG/2ML IJ SOLN
INTRAMUSCULAR | Status: AC
  Filled 2019-09-05: qty 2

## 2019-09-05 MED ORDER — ACETAMINOPHEN 325 MG PO TABS: 650.0000 mg | ORAL_TABLET | ORAL | Status: DC | PRN

## 2019-09-05 SURGICAL SUPPLY — 28 items
BALLN LUTONIX AV 8X40X75 (BALLOONS) ×3
BALLN MUSTANG 9X40X75 (BALLOONS) ×3
BALLOON LUTONIX AV 8X40X75 (BALLOONS) ×2 IMPLANT
BALLOON MUSTANG 9X40X75 (BALLOONS) ×2 IMPLANT
CATH ANGIO 5F PIGTAIL 65CM (CATHETERS) ×3 IMPLANT
CATH SHOCKWAVE 7.0X60 (CATHETERS) ×3 IMPLANT
CATH STRAIGHT 5FR 65CM (CATHETERS) ×3 IMPLANT
CLOSURE MYNX CONTROL 6F/7F (Vascular Products) ×3 IMPLANT
GLIDEWIRE ADV .035X180CM (WIRE) ×3 IMPLANT
KIT ENCORE 26 ADVANTAGE (KITS) ×3 IMPLANT
KIT MICROPUNCTURE NIT STIFF (SHEATH) ×3 IMPLANT
KIT PV (KITS) ×3 IMPLANT
SHEATH FLEXOR ANSEL 1 7F 45CM (SHEATH) ×3 IMPLANT
SHEATH PINNACLE 5F 10CM (SHEATH) ×3 IMPLANT
SHEATH PINNACLE 7F 10CM (SHEATH) ×3 IMPLANT
SHEATH PINNACLE 8F 10CM (SHEATH) ×3 IMPLANT
SHEATH PINNACLE 9F 10CM (SHEATH) ×3 IMPLANT
SHEATH PINNACLE MP 7F 45CM (SHEATH) ×3 IMPLANT
SHEATH PROBE COVER 6X72 (BAG) ×6 IMPLANT
STENT ABSOLUTE 10X40X135 (Permanent Stent) ×3 IMPLANT
STOPCOCK MORSE 400PSI 3WAY (MISCELLANEOUS) ×3 IMPLANT
SYR MEDRAD MARK 7 150ML (SYRINGE) ×3 IMPLANT
TRANSDUCER W/STOPCOCK (MISCELLANEOUS) ×3 IMPLANT
TRAY PV CATH (CUSTOM PROCEDURE TRAY) ×3 IMPLANT
TUBING CIL FLEX 10 FLL-RA (TUBING) ×3 IMPLANT
WIRE AMPLATZ SS-J .035X180CM (WIRE) ×3 IMPLANT
WIRE HITORQ VERSACORE ST 145CM (WIRE) ×3 IMPLANT
WIRE ZILIENT 014 4G (WIRE) ×3 IMPLANT

## 2019-09-05 NOTE — Discharge Instructions (Signed)

## 2019-09-05 NOTE — Interval H&P Note (Signed)
History and Physical Interval Note:  09/05/2019 9:53 AM  Brian Brock  has presented today for surgery, with the diagnosis of claudication.  The various methods of treatment have been discussed with the patient and family. After consideration of risks, benefits and other options for treatment, the patient has consented to  Procedure(s): LOWER EXTREMITY ANGIOGRAPHY (N/A) and possible angioplasty as a surgical intervention.  The patient's history has been reviewed, patient examined, no change in status, stable for surgery.  I have reviewed the patient's chart and labs.  Questions were answered to the patient's satisfaction.     Adrian Prows

## 2019-09-05 NOTE — Progress Notes (Signed)
Discharge instructions reviewed with pt and is wife (via telephone) both voice understanding.

## 2019-09-05 NOTE — Progress Notes (Signed)
Ambulated to bathroom to void tol well no bleeding noted before or after ambulation.

## 2019-09-06 LAB — POCT ACTIVATED CLOTTING TIME
Activated Clotting Time: 153 seconds
Activated Clotting Time: 252 seconds
Activated Clotting Time: 224 seconds

## 2019-09-11 ENCOUNTER — Telehealth: Payer: Self-pay

## 2019-09-11 NOTE — Telephone Encounter (Signed)
Yes it is because of improved blood flow, you get swelling and is a good sign unless painful. Can come in to be seen sooner and tomorrow is good as I have only one case

## 2019-09-11 NOTE — Telephone Encounter (Signed)
I want to see him tomorrow

## 2019-09-12 ENCOUNTER — Other Ambulatory Visit: Payer: Self-pay

## 2019-09-12 ENCOUNTER — Ambulatory Visit: Payer: Medicare Other | Admitting: Cardiology

## 2019-09-12 ENCOUNTER — Encounter: Payer: Self-pay | Admitting: Cardiology

## 2019-09-12 VITALS — BP 131/56 | HR 62 | Temp 98.0°F | Resp 15 | Ht 69.0 in | Wt 194.5 lb

## 2019-09-12 DIAGNOSIS — M79672 Pain in left foot: Secondary | ICD-10-CM

## 2019-09-12 DIAGNOSIS — I2510 Atherosclerotic heart disease of native coronary artery without angina pectoris: Secondary | ICD-10-CM

## 2019-09-12 DIAGNOSIS — I739 Peripheral vascular disease, unspecified: Secondary | ICD-10-CM

## 2019-09-12 NOTE — Progress Notes (Signed)
Primary Physician/Referring:  [name], MD  Patient ID: Brian Brock, male    DOB: 06-23-1945, 74 y.o.   MRN: [id number]  Chief Complaint  Patient presents with  . Coronary Artery Disease    swelling   HPI:    Brian Brock  is a 73 y.o. Caucasian male with CAD with multiple angioplasties over the years, due to restenosis underwent CABG on 09/12/2012 but had recurrence of angina and LIMA occluded needing LAD stenting in 2016.  Past medical history significant for hypertension, hyperglycemia, hyperlipidemia, hypothyroidism, peripheral vascular disease with bilateral SFA stenting and angioplasty, right femoral endarterectomy and left external iliac artery stent in past, spontaneous DVT and pulmonary embolism in January 2020 and has been on Xarelto since then.  He has chronic dyspnea on exertion and COPD with ongoing tobacco use, had quit  on 07/17/2019 but started to smoke again.  Due to severe symptoms of claudication, underwent peripheral arteriogram on 09/05/2019 and had successful revascularization of left common iliac artery high-grade stenosis and also left common femoral artery calcific stenosis via stent placement and lithotripsy and drug-coated balloon angioplasty respectively at the lesion sites.  He had done well for 3 days following the angioplasty and has noticed improvement in symptoms of claudication involving left hip, has started noticing more pain in his right hip.  However 2 days ago he started noticing pain and swelling in his left foot and hence I am seeing him on a urgent basis to exclude any embolic complications.  He was also evaluated by his PCP and D-dimer was minimally elevated which is to be expected, he was scheduled for venous duplex.  He continues to have severe bilateral hip pain even with minimal activity and states he has reduced his activity and now feels he has lost his endurance. He has not noticed any ulceration or changes in his coloration of  his skin. He also has mild chronic dyspnea and cough and has occasional twinges of chest pain randomly lasting <1 min.  He underwent carotid duplex, nuclear stress test and echocardiogram and presents for follow-up.  No new symptomatology.  Past Medical History:  Diagnosis Date  . AF (atrial fibrillation) ([place])    happened after CABG, has not had it since  . Allergic rhinitis   . Allergy   . Anginal pain ([place]) 1997  . Arthritis    "lower back" (09/26/2015)  . Childhood asthma   . Chronic bronchitis ([place])    Yearly  . COPD (chronic obstructive pulmonary disease) ([place])   . Coronary artery disease    Multiple stents  . Dyslipidemia   . Dysrhythmia   . Femoral hernia of right side 12/28/2016  . GERD (gastroesophageal reflux disease)   . History of hiatal hernia   . Hyperlipemia   . Hypertension   . Hypothyroidism   . Irritable bowel syndrome   . Myocardial infarct ([place]) 1997   "mild"  . PE (pulmonary thromboembolism) ([place]) 05/2018  . Peripheral vascular disease ([place])    prior stenting  . Restless legs    Past Surgical History:  Procedure Laterality Date  . CARDIAC CATHETERIZATION  2004   had remote tandem stents in 1997 and 1998 for LAD lesions, RCA stent in 1997 , midprto=ion, and a proximal RCA  SENT IN 1999, all DES; some were IVIS  guide.  [name] CARDIAC CATHETERIZATION  09/07/2012   demonstrated severe 2- vessel disease,patent LAD,patient circ and patent right stents, but 90% ostial LAD whic was new.  [name]  CAROTID DOPPLER  100/24/11   RGT BULB and PROXIMAL ICA 0-49%; LFT. ICA  0-49 ; RGT SUBCLAVIAN ARTERY < 50% ; LFT SUBCLAVIAN normal patency; RGT  and LFT  VERTEBRAL ARTERIES  . COLONOSCOPY  11/06/2016   Diverticulosis of colon. Internal hemorrhoids.   . CORONARY ANGIOPLASTY    . CORONARY ANGIOPLASTY WITH STENT PLACEMENT  '97, '98, '04, '06, '08, '10   seven cardiac stents  . CORONARY ARTERY BYPASS GRAFT N/A 09/12/2012   Procedure: OFF PUMP CORONARY ARTERY BYPASS GRAFTING (CABG);   Surgeon: Melrose Nakayama, MD;  Location: [facility];  Service: Open Heart Surgery;  Laterality: N/A;  Times two using left internal mammary artery and endoscopically harvested right saphenous vein  ;LIMA-LAD and SVG -OM1  . DOPPLER ECHOCARDIOGRAPHY  11/23/2012,09/30/2010    LV EF 50-55%----09/30/2010-LV EF = >55%  . ENDARTERECTOMY FEMORAL Right 12/19/2015   Procedure: ENDARTERECTOMY RIGHT FEMORAL ARTERY;  Surgeon: Serafina Mitchell, MD;  Location: [facility];  Service: Vascular;  Laterality: Right;  . event monitor  11/04/2012-11/17/2012   pt denies this hx on 07/24/2014  . FEMORAL ARTERY STENT Bilateral 7/06  8/08   Lt SFA '06, Rt SFA 8/08  . HERNIA REPAIR  10/2008   "repaired w/gallbladder OR"  . HERNIA REPAIR  07/2016  . INGUINAL HERNIA REPAIR Right 12/28/2016   Procedure: OPEN REPAIR RIGHT GROIN HERNIA WITH MESH;  Surgeon: Fanny Skates, MD;  Location: [facility];  Service: General;  Laterality: Right;  . INTRAVASCULAR LITHOTRIPSY  09/05/2019   Procedure: INTRAVASCULAR LITHOTRIPSY;  Surgeon: Adrian Prows, MD;  Location: [facility];  Service: Cardiovascular;;  left common femoral  . LAPAROSCOPIC CHOLECYSTECTOMY  June 2010  . LEFT HEART CATHETERIZATION WITH CORONARY ANGIOGRAM N/A 09/07/2012   Procedure: LEFT HEART CATHETERIZATION WITH CORONARY ANGIOGRAM;  Surgeon: Troy Sine, MD;  Location: [facility];  Service: Cardiovascular;  Laterality: N/A;  . LEFT HEART CATHETERIZATION WITH CORONARY/GRAFT ANGIOGRAM N/A 07/24/2014   Procedure: LEFT HEART CATHETERIZATION WITH Beatrix Fetters;  Surgeon: Troy Sine, MD; LAD 70%, 50% ISR, mLAD 99%, CFX 30%, patent stent, RCA patent, LIMA-LAD occluded, SVG-OM 1 patent, EF 50-55%  . LEXISCAN STRESS TEST  09/06/2012  . LOWER ARTERIAL DOPPLER  03/25/2012   FGT CIA 0-49%; RGT CFA AND SFA AT STENT 0-49%; RGT DISTAL SFA = <50%; LFT. EIA  50-69%; LFT. CFA 0-49% ;;LFT. SFA  AT STENT NORMAL PATENCY  . LOWER EXTREMITY ANGIOGRAPHY N/A 09/05/2019   Procedure: LOWER  EXTREMITY ANGIOGRAPHY;  Surgeon: Adrian Prows, MD;  Location: [facility];  Service: Cardiovascular;  Laterality: N/A;  . NM MYOVIEW LTD  05/06/2012   EF -STUDY NOT GATED, NORMAL STRESS NUCLEAR ,NORMAL LV FUNCTION  . PATCH ANGIOPLASTY Right 12/19/2015   Procedure: PATCH ANGIOPLASTY RIGHT FEMORAL ARTERY;  Surgeon: Serafina Mitchell, MD;  Location: [facility];  Service: Vascular;  Laterality: Right;  . PERCUTANEOUS CORONARY STENT INTERVENTION (PCI-S) Right 07/24/2014   Procedure: PERCUTANEOUS CORONARY STENT INTERVENTION (PCI-S);  Surgeon: Troy Sine, MD; ostial LAD PTCA with angioscope scoring balloon, 3.0 x 34 mm Resolute DES extending from the ostium to the mid LAD, covering all significant stenoses   . PERIPHERAL VASCULAR CATHETERIZATION N/A 09/26/2015   Procedure: Abdominal Aortogram w/Lower Extremity;  Surgeon: Lorretta Harp, MD;  Location: [facility];  Service: Cardiovascular;  Laterality: N/A;  . PERIPHERAL VASCULAR CATHETERIZATION Right 02/20/2016   Procedure: Peripheral Vascular Atherectomy;  Surgeon: Lorretta Harp, MD;  Location: [facility];  Service: Cardiovascular;  Laterality: Right;  SFA  . PERIPHERAL VASCULAR CATHETERIZATION Right 02/20/2016   Procedure: Peripheral Vascular Balloon Angioplasty;  Surgeon: Lorretta Harp, MD;  Location: [facility];  Service: Cardiovascular;  Laterality: Right;  SFA  . PERIPHERAL VASCULAR INTERVENTION  09/05/2019   Procedure: PERIPHERAL VASCULAR INTERVENTION;  Surgeon: Adrian Prows, MD;  Location: [facility];  Service: Cardiovascular;;  left common iliac  . TONSILLECTOMY  1950's  . UPPER GASTROINTESTINAL ENDOSCOPY     Family History  Problem Relation Age of Onset  . Coronary artery disease Father 36  . Heart attack Father   . Coronary artery disease Brother 3       CABG in 2000  . Deep vein thrombosis Brother   . Colon cancer Neg Hx   . Esophageal cancer Neg Hx   . Rectal cancer Neg Hx   . Stomach cancer Neg Hx       Social History   Tobacco Use  . Smoking status: Current Some Day Smoker    Packs/day: 0.50    Years: 56.00    Pack years: 28.00    Types: Cigarettes    Last attempt to quit: 03/25/2016    Years since quitting: 3.4  . Smokeless tobacco: Current User    Types: Chew  Substance Use Topics  . Alcohol use: Yes    Alcohol/week: 1.0 standard drinks    Types: 1 Standard drinks or equivalent per week    Comment: 09/26/2015 "varies; might have a few drinks q 2 months"  Marital status: Married   ROS  Review of Systems  Cardiovascular: Positive for claudication (right hip) and dyspnea on exertion (stable). Negative for chest pain and leg swelling.  Musculoskeletal: Positive for arthritis, back pain and joint pain (left toes pain and swelling).  Gastrointestinal: Negative for melena.   Objective  Blood pressure (!) 131/56, pulse 62, temperature 98 F (36.7 C), temperature source Temporal, resp. rate 15, height 5\' 9"  (1.753 m), weight 194 lb 8 oz (88.2 kg), SpO2 96 %.  Vitals with BMI 09/12/2019 09/05/2019 09/05/2019  Height 5\' 9"  - -  Weight 194 lbs 8 oz - -  BMI [id number] - -  Systolic [id number] 0000000  Diastolic 56 93 57  Pulse 62 61 42     Physical Exam  Constitutional: He appears well-developed and well-nourished.  Cardiovascular: Normal rate, regular rhythm and normal heart sounds.  Pulses:      Carotid pulses are 2+ on the right side with bruit and 2+ on the left side.      Femoral pulses are 2+ on the right side with bruit and 2+ on the left side with bruit.      Popliteal pulses are 1+ on the right side and 2+ on the left side.       Dorsalis pedis pulses are 2+ on the right side and 1+ on the left side.       Posterior tibial pulses are 0 on the right side and 1+ on the left side.  No JVD. Left foot mild 1-2+ pitting edema. Foot is warm without signs of inflammation or any skin changes suggestive of embolic complications. No venous engorgement. Left leg is warm, non tender without  edema. Bilateral superficial varicosities noted.   Pulmonary/Chest: He has rales (bialteral left base worse than right. Diffuse scattered).  Abdominal: Soft. Bowel sounds are normal.   Laboratory examination:   Recent Labs    01/05/19 0922 06/15/19 1052 08/31/19 1436  NA 141 143 143  K 3.7 3.9 5.0  CL 106 108 106  CO2 29 29 24   GLUCOSE 124* 168* 100*  BUN 16 15 21   CREATININE 1.09 1.06 1.22  CALCIUM 8.4* 8.9 9.3  GFRNONAA >60 >60 58*  GFRAA >60 >60 68   estimated creatinine clearance is 59.3 mL/min (by C-G formula based on SCr of 1.22 mg/dL).  CMP Latest Ref Rng & Units 08/31/2019 06/15/2019 01/05/2019  Glucose 65 - 99 mg/dL 100(H) 168(H) 124(H)  BUN 8 - 27 mg/dL 21 15 16   Creatinine 0.76 - 1.27 mg/dL 1.22 1.06 1.09  Sodium 134 - 144 mmol/L 143 143 141  Potassium 3.5 - 5.2 mmol/L 5.0 3.9 3.7  Chloride 96 - 106 mmol/L 106 108 106  CO2 20 - 29 mmol/L 24 29 29   Calcium 8.6 - 10.2 mg/dL 9.3 8.9 8.4(L)  Total Protein 6.5 - 8.1 g/dL - 6.3(L) 5.7(L)  Total Bilirubin 0.3 - 1.2 mg/dL - 0.6 0.5  Alkaline Phos 38 - 126 U/L - 75 78  AST 15 - 41 U/L - 21 19  ALT 0 - 44 U/L - 21 17   CBC Latest Ref Rng & Units 08/31/2019 06/15/2019 01/05/2019  WBC 3.4 - 10.8 x10E3/uL 9.9 8.0 8.8  Hemoglobin 13.0 - 17.7 g/dL 14.5 14.5 14.0  Hematocrit 37.5 - 51.0 % 42.5 44.0 42.7  Platelets 150 - 450 x10E3/uL 217 230 212   Lipid Panel     Component Value Date/Time   CHOL 131 04/06/2019 1002   TRIG 148 04/06/2019 1002   HDL 36 (L) 04/06/2019 1002   CHOLHDL 2.9 02/07/2018 0915   CHOLHDL 3.0 09/06/2012 0455   VLDL 36 09/06/2012 0455   LDLCALC 69 04/06/2019 1002   HEMOGLOBIN A1C Lab Results  Component Value Date   HGBA1C 6.0 (H) 04/06/2019   MPG 140 (H) 09/05/2012   TSH Recent Labs    04/06/19 1002  TSH 2.430   Medications and allergies   Allergies  Allergen Reactions  . Other Anaphylaxis, Shortness Of Breath and Swelling    [place] nuts- "will kill me"  . Clarithromycin Nausea Only  .  Fenofibrate Micronized Other (See Comments)    Caused weakness/lethargy  . Metoprolol Other (See Comments)    Fatigue and malaise  . Tricor [Fenofibrate] Other (See Comments)    Tired/weak  . Fish Oil Rash  . Levofloxacin Other (See Comments)    Doesn't remember  . Meloxicam Other (See Comments)    Achy, fatigue  . Metaxalone Other (See Comments)    Doesn't remember  . Niaspan [Niacin] Other (See Comments)    Doesn't remember  . Sulfadiazine Hives and Rash     Current Outpatient Medications  Medication Instructions  . acetaminophen (TYLENOL) 500-1,000 mg, Oral, Every 6 hours PRN  . aspirin EC 81 mg, Oral, Daily at bedtime  . BYSTOLIC 5 MG tablet TAKE 1/2 TABLET ONCE DAILY  . cetirizine (ZYRTEC) 10 mg, Oral, Daily  . cholestyramine light (PREVALITE) 4 g, Oral, Every evening  . cyclobenzaprine (FLEXERIL) 10 mg, Oral, 3 times daily PRN  . ipratropium (ATROVENT) 0.03 % nasal spray 1 spray, Each Nare, Daily PRN  . isosorbide mononitrate (IMDUR) 30 MG 24 hr tablet TAKE 1/2 TABLET BY MOUTH EVERY DAY  . levothyroxine (SYNTHROID) 25 mcg, Oral, Daily before breakfast  . loperamide (IMODIUM A-D) 2 mg, Oral, 4 times daily PRN  . losartan (COZAAR) 50 mg, Oral, Daily  . Melatonin 12 mg, Oral, Daily at bedtime  .  Multiple Vitamin (MULTIVITAMIN WITH MINERALS) TABS tablet 1 tablet, Oral, Daily, One-A-Day for Men 50+  . nitroGLYCERIN (NITROSTAT) 0.4 MG SL tablet PLACE 1 TABLET UNDER THE TONGUE, MAY REPEAT EVERY 5 MINUTES MAX 3 TABLETS  . pantoprazole (PROTONIX) 40 mg, Oral, 2 times daily  . Probiotic Product (PROBIOTIC PO) 1 capsule, Oral, Daily  . rivaroxaban (XARELTO) 20 mg, Oral, Daily with supper  . rosuvastatin (CRESTOR) 20 MG tablet TAKE ONE TABLET BY MOUTH EVERY DAY  . vitamin C 1,000 mg, Oral, 2 times daily  . Vitamin D3 2,000 Units, Oral, Daily  . Zinc 50 mg, Oral, 2 times daily  . zolpidem (AMBIEN) 10 mg, Oral, At bedtime PRN   Radiology:   No results found.  Cardiac Studies:      Coronary angiogram and stenting to his LAD and RCA  in 1997, 1998, and in 2004 to his proximal left circumflex coronary artery.  Due to recurrent restenosis underwent CABG on 09/12/2012 with off-pump LIMA to LAD and SVG to OM1.  CABG  by Dr. Roxan Hockey on 09/12/2012 with an off-pump LIMA to LAD (Occluded by angoigram in 2016) and SVG to circumflex marginal 1 vessel.  Coronary angiogram 07/24/2014: High-grade stenosis to the proximal LAD, patent circumflex stent, patent SVG to OM1 however LIMA to LAD occluded.  Successful stenting to proximal LAD with 3.0 x 34 mm resolute DES, postdilated to 3.4 mm.  Peripheral arteriogram: Stenting to his left SFA in 2004 and right SFA in 2008.  Stenting of left external iliac artery, right common femoral artery endarterectomy and patch angioplasty in August 2017 with staged right SFA atherectomy.  Abdominal aortic and iliac duplex 11/03/2018: Stenosis: +--------------------+-------------+---------------+  Location      Stenosis   Stent       +--------------------+-------------+---------------+  Right Common Iliac <50% stenosis          +--------------------+-------------+---------------+  Left Common Iliac  <50% stenosis          +--------------------+-------------+---------------+  Right External Iliac<50% stenosis          +--------------------+-------------+---------------+  Left External Iliac        50-99% stenosis  +--------------------+-------------+---------------+   Lower extremity arterial duplex 11/02/2018: Right: Atherosclerosis throughout the right lower extremity without focal stenosis.  Patent mid-distal SFA stent with 1-49% stenosis.  Left: Atherosclerosis throughout the left lower extremity.  Patent distal SFA stent with 1-49% stenosis.    ABI 11/02/2018: Normal ABI bilaterally. ABI/TBIToday's ABIToday's TBIPrevious ABIPrevious TBI   +-------+-----------+-----------+------------+------------+  Right 1.07    0.81    1.0     0.82      +-------+-----------+-----------+------------+------------+  Left  0.99    0.85    1.06    0.82      +-------+-----------+-----------+------------+------------+   Carotid artery duplex  08/07/2019:  Stenosis in the right internal carotid artery (16-49%). Stenosis in the  right external carotid artery (<50%).  Stenosis in the left internal carotid artery (16-49%). Stenosis in the  left common carotid artery (<50%).  Antegrade right vertebral artery flow. Antegrade left vertebral artery  flow.  Follow up in one year is appropriate if clinically indicated.   Echocardiogram 08/07/2019:  Normal LV systolic function with visual EF 50-55%. Left ventricle cavity  is normal in size. Normal global wall motion. No obvious regional wall  motion abnormalities. Mild concentric hypertrophy of the left ventricle.  Indeterminate diastolic filling pattern, normal LAP. Calculated EF 50%.  Left atrial cavity is severely dilated.  Visually, right atrial cavity is mildly dilated.  Mild mitral regurgitation.  Moderate tricuspid regurgitation. No evidence of pulmonary hypertension. RVSP measures 33 mmHg.  Mild pulmonic regurgitation.  IVC is dilated with a respiratory response of >50%.  No significant change compared to previous study 05/25/2018.   Lexiscan (Walking with mod Bruce)Tetrofosmin Stress Test  08/07/2019: Nondiagnostic ECG stress. Perfusion imaging study is abnormal.  Mild inferior, inferolateral small sized defect consistent with ischemia. Left ventricular systolic function is mildly depressed, LVEF calculated at 49% with inferior hypokinesis. Compared to 10/22/2016 report- normal study.  Intermediate risk study.   Peripheral arteriogram 09/05/19  Abdominal aortogram revealed presence of 2 renal arteries 1 on either sides, widely patent.  Mild  atherosclerotic changes abdominal aorta noted.  Bilateral aortoiliac bifurcation was widely patent with mild calcification.  There was diffuse moderate calcification noted throughout the entire iliac artery system.  Bilateral internal iliac arteries are occluded.  There is a old left external iliac artery stent which has a 40 to 50% in-stent restenosis focally without pressure gradient.  Left common iliac artery has a 80% stenosis with a 20 to 30 mm pressure gradient. Left common femoral artery is very long and profunda bifurcation happens distal than normal, the left common femoral artery has a calcific 80 to 90% stenosis, brisk flow in the left SFA and old left SFA stent is widely patent.  Below the left knee there is two-vessel runoff with occluded left PT.  Right common femoral artery patch angioplasty site is widely patent.  Right SFA shows mild diffuse disease with moderate amount of calcification.  Previously placed right mid SFA stent is widely patent.  There is three-vessel runoff below the right knee.  Intervention data: 1. Left common femoral artery: PTA with Shockwave lithotripsy with a 7.0 x 60 mm shockwave balloon followed by drug-coated balloon angioplasty with a 8 mm x 40 mm Lutonix angioplasty, stenosis reduced from 85% to less than 10% with brisk flow. 2. PTA and self-expanding 10.0 x 40 mm absolute Pro stent to the left common iliac artery followed by post angioplasty with a 9.0 x 40 mm Mustang at 8 atmospheric pressure for 60 seconds, stenosis reduced from 80% to 0%.  There was also left external iliac artery old stent that had a 50% stenosis was also stented with the same self-expanding stent overlapped followed by balloon angioplasty.  Overall no residual stenosis.  EKG:   EKG 07/26/2019: Marked sinus bradycardia at 48 bpm, normal axis.  Borderline low voltage complexes.  No evidence of ischemia.    Assessment     ICD-10-CM   1. Claudication in peripheral vascular disease  (HCC)  I73.9 PCV ANKLE BRACHIAL INDEX (ABI)  2. Left foot pain  M79.672   3. Coronary artery disease involving native coronary artery of native heart without angina pectoris  I25.10      No orders of the defined types were placed in this encounter.   There are no discontinued medications.  Recommendations:   Brian Brock  is a 74 y.o. Caucasian male with CAD with multiple angioplasties over the years, due to restenosis underwent CABG on 09/12/2012 but had recurrence of angina and LIMA occluded needing LAD stenting in 2016.  Past medical history significant for hypertension, hyperglycemia, hyperlipidemia, hypothyroidism, spontaneous DVT and pulmonary embolism in January 2020 and has been on Xarelto since then.  He has chronic dyspnea on exertion and COPD with ongoing tobacco use, had quit  on 07/17/2019 but started to smoke again.  He has severe peripheral vascular disease with bilateral  SFA stenting and angioplasty, right femoral endarterectomy and left external iliac artery stent in past in 2014 and  underwent peripheral arteriogram on 09/05/2019 and had successful revascularization of left common iliac artery high-grade stenosis and also left common femoral artery calcific stenosis via stent placement and lithotripsy and drug-coated balloon angioplasty respectively at the lesion sites.  He has been seen on a urgent basis in office today due to left foot pain.  On exam there does not appear to be any embolic complications, there is no skin necrosis or blanching, no bruise discoloration, has point tenderness left metatarsophalangeal joint second, left leg is also nice and warm without edema or tenderness.  He was also started back on anticoagulation with Eliquis the same day as angioplasty and his overall risk for DVT is very low.  Suspect he may have an acute gouty arthritis.  I personally called Dr. Janace Litten I discussed that he probably needs to see him back personally.  Pain control,  evaluation for nonvascular reasons for foot pain is indicated.  Elevated D-dimer is nonspecific in view of recent angioplasty and intravascular manipulation without symptoms of acute DVT or dyspnea.  I have ordered ABI to establish a baseline, I will see him back in 4 to 6 weeks for follow-up. Continue ASA for now.   Adrian Prows, MD, [facility] 09/12/2019, 10:49 PM [facility]. [place] Office: [phone]

## 2019-09-12 NOTE — Telephone Encounter (Signed)
Pt added to schedule fpor today

## 2019-09-26 ENCOUNTER — Other Ambulatory Visit: Payer: Self-pay

## 2019-09-26 ENCOUNTER — Ambulatory Visit: Payer: Medicare Other

## 2019-09-26 DIAGNOSIS — I739 Peripheral vascular disease, unspecified: Secondary | ICD-10-CM

## 2019-09-28 ENCOUNTER — Ambulatory Visit: Payer: Medicare Other | Admitting: Cardiology

## 2019-10-19 NOTE — Progress Notes (Signed)
Primary Physician/Referring:  [name], MD  Patient ID: Brian Brock, male    DOB: 01/07/1946, 74 y.o.   MRN: [id number]  Chief Complaint  Patient presents with  . Follow-up    6 week  . Coronary Artery Disease  . PAD   HPI:    Brian Brock  is a 74 y.o. Caucasian male with CAD with multiple angioplasties over the years, due to restenosis underwent CABG on 09/12/2012 but had recurrence of angina and LIMA occluded needing LAD stenting in 2016.  Past medical history significant for hypertension, hyperglycemia, hyperlipidemia, hypothyroidism, peripheral vascular disease with bilateral SFA stenting and angioplasty, right femoral endarterectomy and left external iliac artery stent in past, spontaneous DVT and pulmonary embolism in January 2020 and has been on Xarelto since then.  He has chronic dyspnea on exertion and COPD with ongoing tobacco use.  Due to severe symptoms of claudication, underwent peripheral arteriogram on 09/05/2019 and had successful revascularization of left common iliac artery high-grade stenosis and also left common femoral artery calcific stenosis via stent placement and lithotripsy and drug-coated balloon angioplasty respectively at the lesion sites.  With regard to claudication symptoms, symptoms improved.  His main complaint today is marked decrease in exercise tolerance and he has also noticed fluctuation in blood pressure.  He is also changed his home medications trying to see whether the medications are making him feel tired hence has reduced losartan dose and also Bystolic dose and Crestor dose by half.  Since then he has noticed his blood pressure to be elevated.  Past Medical History:  Diagnosis Date  . AF (atrial fibrillation) ([place])    happened after CABG, has not had it since  . Allergic rhinitis   . Allergy   . Anginal pain ([place]) 1997  . Arthritis    "lower back" (09/26/2015)  . Childhood asthma   . Chronic bronchitis ([place])    Yearly  .  COPD (chronic obstructive pulmonary disease) ([place])   . Coronary artery disease    Multiple stents  . Dyslipidemia   . Dysrhythmia   . Femoral hernia of right side 12/28/2016  . GERD (gastroesophageal reflux disease)   . History of hiatal hernia   . Hyperlipemia   . Hypertension   . Hypothyroidism   . Irritable bowel syndrome   . Myocardial infarct ([place]) 1997   "mild"  . PE (pulmonary thromboembolism) ([place]) 05/2018  . Peripheral vascular disease ([place])    prior stenting  . Restless legs    Past Surgical History:  Procedure Laterality Date  . CARDIAC CATHETERIZATION  2004   had remote tandem stents in 1997 and 1998 for LAD lesions, RCA stent in 1997 , midprto=ion, and a proximal RCA  SENT IN 1999, all DES; some were IVIS  guide.  [name] CARDIAC CATHETERIZATION  09/07/2012   demonstrated severe 2- vessel disease,patent LAD,patient circ and patent right stents, but 90% ostial LAD whic was new.  . CAROTID DOPPLER  100/24/11   RGT BULB and PROXIMAL ICA 0-49%; LFT. ICA  0-49 ; RGT SUBCLAVIAN ARTERY < 50% ; LFT SUBCLAVIAN normal patency; RGT  and LFT  VERTEBRAL ARTERIES  . COLONOSCOPY  11/06/2016   Diverticulosis of colon. Internal hemorrhoids.   . CORONARY ANGIOPLASTY    . CORONARY ANGIOPLASTY WITH STENT PLACEMENT  '97, '98, '04, '06, '08, '10   seven cardiac stents  . CORONARY ARTERY BYPASS GRAFT N/A 09/12/2012   Procedure: OFF PUMP CORONARY ARTERY BYPASS GRAFTING (CABG);  Surgeon:  Melrose Nakayama, MD;  Location: [facility];  Service: Open Heart Surgery;  Laterality: N/A;  Times two using left internal mammary artery and endoscopically harvested right saphenous vein  ;LIMA-LAD and SVG -OM1  . DOPPLER ECHOCARDIOGRAPHY  11/23/2012,09/30/2010    LV EF 50-55%----09/30/2010-LV EF = >55%  . ENDARTERECTOMY FEMORAL Right 12/19/2015   Procedure: ENDARTERECTOMY RIGHT FEMORAL ARTERY;  Surgeon: Serafina Mitchell, MD;  Location: [facility];  Service: Vascular;  Laterality: Right;  . event monitor   11/04/2012-11/17/2012   pt denies this hx on 07/24/2014  . FEMORAL ARTERY STENT Bilateral 7/06  8/08   Lt SFA '06, Rt SFA 8/08  . HERNIA REPAIR  10/2008   "repaired w/gallbladder OR"  . HERNIA REPAIR  07/2016  . INGUINAL HERNIA REPAIR Right 12/28/2016   Procedure: OPEN REPAIR RIGHT GROIN HERNIA WITH MESH;  Surgeon: Fanny Skates, MD;  Location: [facility];  Service: General;  Laterality: Right;  . INTRAVASCULAR LITHOTRIPSY  09/05/2019   Procedure: INTRAVASCULAR LITHOTRIPSY;  Surgeon: Adrian Prows, MD;  Location: [facility];  Service: Cardiovascular;;  left common femoral  . LAPAROSCOPIC CHOLECYSTECTOMY  June 2010  . LEFT HEART CATHETERIZATION WITH CORONARY ANGIOGRAM N/A 09/07/2012   Procedure: LEFT HEART CATHETERIZATION WITH CORONARY ANGIOGRAM;  Surgeon: Troy Sine, MD;  Location[facility];  Service: Cardiovascular;  Laterality: N/A;  . LEFT HEART CATHETERIZATION WITH CORONARY/GRAFT ANGIOGRAM N/A 07/24/2014   Procedure: LEFT HEART CATHETERIZATION WITH Beatrix Fetters;  Surgeon: Troy Sine, MD; LAD 70%, 50% ISR, mLAD 99%, CFX 30%, patent stent, RCA patent, LIMA-LAD occluded, SVG-OM 1 patent, EF 50-55%  . LEXISCAN STRESS TEST  09/06/2012  . LOWER ARTERIAL DOPPLER  03/25/2012   FGT CIA 0-49%; RGT CFA AND SFA AT STENT 0-49%; RGT DISTAL SFA = <50%; LFT. EIA  50-69%; LFT. CFA 0-49% ;;LFT. SFA  AT STENT NORMAL PATENCY  . LOWER EXTREMITY ANGIOGRAPHY N/A 09/05/2019   Procedure: LOWER EXTREMITY ANGIOGRAPHY;  Surgeon: Adrian Prows, MD;  Location: [facility];  Service: Cardiovascular;  Laterality: N/A;  . NM MYOVIEW LTD  05/06/2012   EF -STUDY NOT GATED, NORMAL STRESS NUCLEAR ,NORMAL LV FUNCTION  . PATCH ANGIOPLASTY Right 12/19/2015   Procedure: PATCH ANGIOPLASTY RIGHT FEMORAL ARTERY;  Surgeon: Serafina Mitchell, MD;  Location: [facility];  Service: Vascular;  Laterality: Right;  . PERCUTANEOUS CORONARY STENT INTERVENTION (PCI-S) Right 07/24/2014   Procedure: PERCUTANEOUS CORONARY STENT  INTERVENTION (PCI-S);  Surgeon: Troy Sine, MD; ostial LAD PTCA with angioscope scoring balloon, 3.0 x 34 mm Resolute DES extending from the ostium to the mid LAD, covering all significant stenoses   . PERIPHERAL VASCULAR CATHETERIZATION N/A 09/26/2015   Procedure: Abdominal Aortogram w/Lower Extremity;  Surgeon: Lorretta Harp, MD;  Location: [facility];  Service: Cardiovascular;  Laterality: N/A;  . PERIPHERAL VASCULAR CATHETERIZATION Right 02/20/2016   Procedure: Peripheral Vascular Atherectomy;  Surgeon: Lorretta Harp, MD;  Location: [facility];  Service: Cardiovascular;  Laterality: Right;  SFA  . PERIPHERAL VASCULAR CATHETERIZATION Right 02/20/2016   Procedure: Peripheral Vascular Balloon Angioplasty;  Surgeon: Lorretta Harp, MD;  Location: [facility];  Service: Cardiovascular;  Laterality: Right;  SFA  . PERIPHERAL VASCULAR INTERVENTION  09/05/2019   Procedure: PERIPHERAL VASCULAR INTERVENTION;  Surgeon: Adrian Prows, MD;  Location: [facility];  Service: Cardiovascular;;  left common iliac  . TONSILLECTOMY  1950's  . UPPER GASTROINTESTINAL ENDOSCOPY     Family History  Problem Relation Age  of Onset  . Coronary artery disease Father 50  . Heart attack Father   . Coronary artery disease Brother 84       CABG in 2000  . Deep vein thrombosis Brother   . Colon cancer Neg Hx   . Esophageal cancer Neg Hx   . Rectal cancer Neg Hx   . Stomach cancer Neg Hx     Social History   Tobacco Use  . Smoking status: Current Some Day Smoker    Packs/day: 0.50    Years: 56.00    Pack years: 28.00    Types: Cigarettes    Last attempt to quit: 03/25/2016    Years since quitting: 3.5  . Smokeless tobacco: Current User    Types: Chew  Substance Use Topics  . Alcohol use: Yes    Alcohol/week: 1.0 standard drinks    Types: 1 Standard drinks or equivalent per week    Comment: 09/26/2015 "varies; might have a few drinks q 2 months"   Marital status: Married    ROS   Review of Systems  Constitution: Positive for malaise/fatigue.  Cardiovascular: Positive for claudication (right hip) and dyspnea on exertion. Negative for chest pain, leg swelling and syncope.  Musculoskeletal: Positive for arthritis, back pain and joint pain (left toes pain and swelling).  Gastrointestinal: Negative for melena.   Objective  Blood pressure (!) 156/62, pulse (!) 52, resp. rate 16, height 5\' 8"  (1.727 m), weight 193 lb 1.6 oz (87.6 kg), SpO2 97 %.  Vitals with BMI 10/20/2019 09/12/2019 09/05/2019  Height 5\' 8"  5\' 9"  -  Weight 193 lbs 2 oz 194 lbs 8 oz -  BMI 0000000 [id number] -  Systolic [id number] [id number] 0000000  Diastolic 62 56 93  Pulse 52 62 61     Physical Exam  Constitutional: He appears well-developed and well-nourished.  Cardiovascular: Normal rate, regular rhythm and normal heart sounds.  Pulses:      Carotid pulses are 2+ on the right side with bruit and 2+ on the left side.      Femoral pulses are 2+ on the right side with bruit and 2+ on the left side with bruit.      Popliteal pulses are 1+ on the right side and 2+ on the left side.       Dorsalis pedis pulses are 2+ on the right side and 1+ on the left side.       Posterior tibial pulses are 0 on the right side and 1+ on the left side.  No JVD. Left foot mild 1-2+ pitting edema. Foot is warm without signs of inflammation or any skin changes suggestive of embolic complications. No venous engorgement. Left leg is warm, non tender without edema. Bilateral superficial varicosities noted.   Pulmonary/Chest: He has rales (bialteral left base worse than right. Diffuse scattered).  Abdominal: Soft. Bowel sounds are normal.   Laboratory examination:   Recent Labs    01/05/19 0922 06/15/19 1052 08/31/19 1436  NA 141 143 143  K 3.7 3.9 5.0  CL 106 108 106  CO2 29 29 24   GLUCOSE 124* 168* 100*  BUN 16 15 21   CREATININE 1.09 1.06 1.22  CALCIUM 8.4* 8.9 9.3  GFRNONAA >60 >60 58*  GFRAA >60 >60 68   CrCl cannot be  calculated (Patient's most recent lab result is older than the maximum 21 days allowed.).  CMP Latest Ref Rng & Units 08/31/2019 06/15/2019 01/05/2019  Glucose 65 - 99 mg/dL 100(H) 168(H) 124(H)  BUN 8 - 27 mg/dL 21 15 16   Creatinine 0.76 - 1.27 mg/dL 1.22 1.06 1.09  Sodium 134 - 144 mmol/L 143 143 141  Potassium 3.5 - 5.2 mmol/L 5.0 3.9 3.7  Chloride 96 - 106 mmol/L 106 108 106  CO2 20 - 29 mmol/L 24 29 29   Calcium 8.6 - 10.2 mg/dL 9.3 8.9 8.4(L)  Total Protein 6.5 - 8.1 g/dL - 6.3(L) 5.7(L)  Total Bilirubin 0.3 - 1.2 mg/dL - 0.6 0.5  Alkaline Phos 38 - 126 U/L - 75 78  AST 15 - 41 U/L - 21 19  ALT 0 - 44 U/L - 21 17   CBC Latest Ref Rng & Units 08/31/2019 06/15/2019 01/05/2019  WBC 3.4 - 10.8 x10E3/uL 9.9 8.0 8.8  Hemoglobin 13.0 - 17.7 g/dL 14.5 14.5 14.0  Hematocrit 37.5 - 51.0 % 42.5 44.0 42.7  Platelets 150 - 450 x10E3/uL 217 230 212   Lipid Panel     Component Value Date/Time   CHOL 131 04/06/2019 1002   TRIG 148 04/06/2019 1002   HDL 36 (L) 04/06/2019 1002   CHOLHDL 2.9 02/07/2018 0915   CHOLHDL 3.0 09/06/2012 0455   VLDL 36 09/06/2012 0455   LDLCALC 69 04/06/2019 1002   HEMOGLOBIN A1C Lab Results  Component Value Date   HGBA1C 6.0 (H) 04/06/2019   MPG 140 (H) 09/05/2012   TSH Recent Labs    04/06/19 1002  TSH 2.430   External Labs:  D-Dimer, QuantitativeResulted: 09/11/2019 9:08 PM [facility] Component Name Value Ref Range  D-Dimer 1,060 (H)  Comment: FDA APPROVED CUTOFF FOR VTE (to include DVT and PE) EXCLUSION BY THIS METHOD IS <500 NG/ML FEU 190 - 500 NG/ML FEU   Comprehensive Metabolic PanelResulted: [id number] 5:55 PM [facility] Component Name Value Ref Range  Sodium 142 135 - 146 MMOL/L  Potassium 4.8 3.5 - 5.3 MMOL/L  Chloride 103 98 - 110 MMOL/L  CO2 29 23 - 30 MMOL/L  BUN 19 8 - 24 MG/DL  Glucose 93  Comment: Patients taking eltrombopag at doses >/= 100 mg daily may show falsely elevated values of  10% or greater. 70 - 99 MG/DL  Creatinine 0.99 0.50 - 1.50 MG/DL  Calcium 9.0 8.5 - 10.5 MG/DL  Total Protein 6.1  Comment: Patients taking eltrombopag at doses >/= 100 mg daily may show falsely elevated values of 10% or greater. 6.0 - 8.3 G/DL  Albumin  3.8 3.5 - 5.0 G/DL  Total Bilirubin 0.6  Comment: Patients taking eltrombopag at doses >/= 100 mg daily may show falsely elevated values of 10% or greater. 0.1 - 1.2 MG/DL  Alkaline Phosphatase 80 25 - 125 IU/L or U/L  AST (SGOT) 27 5 - 40 IU/L or U/L  ALT (SGPT) 18 5 - 50 IU/L or U/L  Anion Gap 9 4 - 14 MMOL/L  Est. GFR Non-African American 75  Comment: GFR estimated by CKD-EPI equations, reportable up to 90 ML/MIN/1.73 M*2     Medications and allergies   Allergies  Allergen Reactions  . Other Anaphylaxis, Shortness Of Breath and Swelling    [place] nuts- "will kill me"  . Clarithromycin Nausea Only  . Fenofibrate Micronized Other (See Comments)    Caused weakness/lethargy  . Metoprolol Other (See Comments)    Fatigue and malaise  . Tricor [Fenofibrate] Other (See Comments)    Tired/weak  . Fish Oil Rash  . Levofloxacin Other (See Comments)    Doesn't remember  . Meloxicam  Other (See Comments)    Achy, fatigue  . Metaxalone Other (See Comments)    Doesn't remember  . Niaspan [Niacin] Other (See Comments)    Doesn't remember  . Sulfadiazine Hives and Rash     Current Outpatient Medications  Medication Instructions  . acetaminophen (TYLENOL) 500-1,000 mg, Oral, Every 6 hours PRN  . aspirin EC 81 mg, Oral, Daily at bedtime  . BYSTOLIC 5 MG tablet TAKE 1/2 TABLET ONCE DAILY  . cetirizine (ZYRTEC) 10 mg, Oral, Daily  . cholestyramine light (PREVALITE) 4 g, Oral, Every evening  . cyclobenzaprine (FLEXERIL) 10 mg, Oral, 3 times daily PRN  . ipratropium (ATROVENT) 0.03 % nasal spray 1 spray, Each Nare, Daily PRN  . levothyroxine (SYNTHROID) 25 mcg, Oral, Daily before breakfast  . loperamide (IMODIUM A-D) 2 mg, Oral, 4 times  daily PRN  . losartan (COZAAR) 100 mg, Oral, Daily  . Melatonin 12 mg, Oral, Daily at bedtime  . Multiple Vitamin (MULTIVITAMIN WITH MINERALS) TABS tablet 1 tablet, Oral, Daily, One-A-Day for Men 50+  . nitroGLYCERIN (NITROSTAT) 0.4 MG SL tablet PLACE 1 TABLET UNDER THE TONGUE, MAY REPEAT EVERY 5 MINUTES MAX 3 TABLETS  . pantoprazole (PROTONIX) 40 mg, Oral, 2 times daily  . Probiotic Product (PROBIOTIC PO) 1 capsule, Oral, Daily  . rivaroxaban (XARELTO) 20 mg, Oral, Daily with supper  . rosuvastatin (CRESTOR) 20 MG tablet TAKE ONE TABLET BY MOUTH EVERY DAY  . vitamin C 1,000 mg, Oral, 2 times daily  . Vitamin D3 2,000 Units, Oral, Daily  . Zinc 50 mg, Oral, 2 times daily  . zolpidem (AMBIEN) 10 mg, Oral, At bedtime PRN   Radiology:   No results found.  Cardiac Studies:   Coronary angiogram and stenting to his LAD and RCA  in 1997, 1998, and in 2004 to his proximal left circumflex coronary artery.  Due to recurrent restenosis underwent CABG on 09/12/2012 with off-pump LIMA to LAD and SVG to OM1.  CABG  by Dr. Roxan Hockey on 09/12/2012 with an off-pump LIMA to LAD (Occluded by angoigram in 2016) and SVG to circumflex marginal 1 vessel.  Coronary angiogram 07/24/2014: High-grade stenosis to the proximal LAD, patent circumflex stent, patent SVG to OM1 however LIMA to LAD occluded.  Successful stenting to proximal LAD with 3.0 x 34 mm resolute DES, postdilated to 3.4 mm.  Peripheral arteriogram: Stenting to his left SFA in 2004 and right SFA in 2008.  Stenting of left external iliac artery, right common femoral artery endarterectomy and patch angioplasty in August 2017 with staged right SFA atherectomy.  Abdominal aortic and iliac duplex 11/03/2018: Stenosis: +--------------------+-------------+---------------+  Location      Stenosis   Stent       +--------------------+-------------+---------------+  Right Common Iliac <50% stenosis            +--------------------+-------------+---------------+  Left Common Iliac  <50% stenosis          +--------------------+-------------+---------------+  Right External Iliac<50% stenosis          +--------------------+-------------+---------------+  Left External Iliac        50-99% stenosis  +--------------------+-------------+---------------+   Lower extremity arterial duplex 11/02/2018: Right: Atherosclerosis throughout the right lower extremity without focal stenosis.  Patent mid-distal SFA stent with 1-49% stenosis.  Left: Atherosclerosis throughout the left lower extremity.  Patent distal SFA stent with 1-49% stenosis.   Carotid artery duplex  08/07/2019:  Stenosis in the right internal carotid artery (16-49%). Stenosis in the  right external carotid artery (<50%).  Stenosis in  the left internal carotid artery (16-49%). Stenosis in the  left common carotid artery (<50%).  Antegrade right vertebral artery flow. Antegrade left vertebral artery  flow.  Follow up in one year is appropriate if clinically indicated.   Echocardiogram 08/07/2019:  Normal LV systolic function with visual EF 50-55%. Left ventricle cavity  is normal in size. Normal global wall motion. No obvious regional wall  motion abnormalities. Mild concentric hypertrophy of the left ventricle.  Indeterminate diastolic filling pattern, normal LAP. Calculated EF 50%.  Left atrial cavity is severely dilated.  Visually, right atrial cavity is mildly dilated.  Mild mitral regurgitation.  Moderate tricuspid regurgitation. No evidence of pulmonary hypertension. RVSP measures 33 mmHg.  Mild pulmonic regurgitation.  IVC is dilated with a respiratory response of >50%.  No significant change compared to previous study 05/25/2018.   Lexiscan (Walking with mod Bruce)Tetrofosmin Stress Test  08/07/2019: Nondiagnostic ECG stress. Perfusion imaging study is abnormal.  Mild inferior, inferolateral  small sized defect consistent with ischemia. Left ventricular systolic function is mildly depressed, LVEF calculated at 49% with inferior hypokinesis. Compared to 10/22/2016 report- normal study.  Intermediate risk study.   Peripheral arteriogram 09/05/19: Abdominal aortogram revealed presence of 2 renal arteries 1 on either sides, widely patent.  Mild atherosclerotic changes abdominal aorta noted.  Bilateral aortoiliac bifurcation was widely patent with mild calcification.  There was diffuse moderate calcification noted throughout the entire iliac artery system.  Bilateral internal iliac arteries are occluded.  There is a old left external iliac artery stent which has a 40 to 50% in-stent restenosis focally without pressure gradient.  Left common iliac artery has a 80% stenosis with a 20 to 30 mm pressure gradient. Left common femoral artery is very long and profunda bifurcation happens distal than normal, the left common femoral artery has a calcific 80 to 90% stenosis, brisk flow in the left SFA and old left SFA stent is widely patent.  Below the left knee there is two-vessel runoff with occluded left PT.  Right common femoral artery patch angioplasty site is widely patent.  Right SFA shows mild diffuse disease with moderate amount of calcification.  Previously placed right mid SFA stent is widely patent.  There is three-vessel runoff below the right knee.  Intervention data: 1. Left common femoral artery: PTA with Shockwave lithotripsy with a 7.0 x 60 mm shockwave balloon followed by drug-coated balloon angioplasty with a 8 mm x 40 mm Lutonix angioplasty, stenosis reduced from 85% to less than 10% with brisk flow. 2. PTA and self-expanding 10.0 x 40 mm absolute Pro stent to the left common iliac artery followed by post angioplasty with a 9.0 x 40 mm Mustang at 8 atmospheric pressure for 60 seconds, stenosis reduced from 80% to 0%.  There was also left external iliac artery old stent that had a  50% stenosis was also stented with the same self-expanding stent overlapped followed by balloon angioplasty.  Overall no residual stenosis.  ABI 09/26/2019:  This exam reveals mildly decreased perfusion of the right lower extremity, noted at the anterior tibial artery level (ABI 0.83) and mildly decreased  perfusion of the left lower extremity, noted at the anterior tibial artery level (ABI 0.86). Compared to 08/31/2019, bilateral ABI 0.81. (H/O Left CIA stent and left CFA angioplasty).   EKG:   07/26/2019: Marked sinus bradycardia at 48 bpm, normal axis.  Borderline low voltage complexes.  No evidence of ischemia.    Assessment     ICD-10-CM   1. Claudication  in peripheral vascular disease (HCC)  I73.9   2. Coronary artery disease involving native coronary artery of native heart without angina pectoris  I25.10   3. Hypercholesteremia  E78.00   4. Essential hypertension  I10   5. Witnessed episode of apnea  R06.81 Pulse oximetry, overnight    Ambulatory referral to Sleep Studies  6. Chronic fatigue and malaise  R53.82 Pulse oximetry, overnight   R53.81 Ambulatory referral to Sleep Studies  7. Dyspnea on exertion  R06.00 Pulse oximetry, overnight    Ambulatory referral to Sleep Studies    Recommendations:   Brian Brock  is a 74 y.o. Caucasian male with CAD with multiple angioplasties over the years, due to restenosis underwent CABG on 09/12/2012 but had recurrence of angina and LIMA occluded needing LAD stenting in 2016.  Past medical history significant for hypertension, hyperglycemia, hyperlipidemia, hypothyroidism, spontaneous DVT and pulmonary embolism in January 2020 and has been on Xarelto since then.  He has chronic dyspnea on exertion and COPD with ongoing tobacco use.  PAD significant for left external iliac artery and left SFA stenting 2014, left common iliac artery stenting and left common femoral artery shockwave lithotripsy followed by [facility] on 09/05/2019.  He is stable from  cardiac standpoint without recurrence of angina pectoris or heart failure.  His PAD is stable, he also has pseudoclaudication with back pain and spinal stenosis and degenerative joint disease as well.  His main complaint being mild generalized fatigue, daytime somnolence, he has witnessed apneic episodes, I suspect his lethargy and lack of energy is probably related to sleep apnea, referral to Dr. Roddie Mc made.  He states his blood pressure has been all over the place but on further questioning, he himself has reduced the dosage of all the medications trying to see whether medications were making him feel fatigued and tired.  Advised him to go back on losartan 100 mg daily, keep Bystolic at 2.5 mg daily for now and may need 5 mg and also continue reduced dose of Crestor 10 mg daily for now but I prefer him to be on 20 mg.  I would like to see him back in 2 months for follow-up.  40-minute encounter.  Adrian Prows, MD, [facility] 10/21/2019, 2:55 PM [facility] Pager: [phone] Office: [phone]

## 2019-10-20 ENCOUNTER — Ambulatory Visit: Payer: Medicare Other | Admitting: Cardiology

## 2019-10-20 ENCOUNTER — Other Ambulatory Visit: Payer: Self-pay

## 2019-10-20 ENCOUNTER — Encounter: Payer: Self-pay | Admitting: Cardiology

## 2019-10-20 VITALS — BP 156/62 | HR 52 | Resp 16 | Ht 68.0 in | Wt 193.1 lb

## 2019-10-20 DIAGNOSIS — R5382 Chronic fatigue, unspecified: Secondary | ICD-10-CM

## 2019-10-20 DIAGNOSIS — R5381 Other malaise: Secondary | ICD-10-CM

## 2019-10-20 DIAGNOSIS — E7800 Pure hypercholesterolemia, unspecified: Secondary | ICD-10-CM

## 2019-10-20 DIAGNOSIS — I10 Essential (primary) hypertension: Secondary | ICD-10-CM

## 2019-10-20 DIAGNOSIS — I739 Peripheral vascular disease, unspecified: Secondary | ICD-10-CM

## 2019-10-20 DIAGNOSIS — R0681 Apnea, not elsewhere classified: Secondary | ICD-10-CM

## 2019-10-20 DIAGNOSIS — R0600 Dyspnea, unspecified: Secondary | ICD-10-CM

## 2019-10-20 DIAGNOSIS — I2510 Atherosclerotic heart disease of native coronary artery without angina pectoris: Secondary | ICD-10-CM

## 2019-10-20 DIAGNOSIS — R0609 Other forms of dyspnea: Secondary | ICD-10-CM

## 2019-10-24 ENCOUNTER — Encounter (HOSPITAL_COMMUNITY): Payer: Medicare Other

## 2019-11-13 ENCOUNTER — Institutional Professional Consult (permissible substitution): Payer: Self-pay | Admitting: Neurology

## 2019-11-29 ENCOUNTER — Encounter: Payer: Self-pay | Admitting: Cardiology

## 2019-11-29 ENCOUNTER — Other Ambulatory Visit: Payer: Self-pay

## 2019-11-29 ENCOUNTER — Ambulatory Visit: Payer: Medicare Other | Admitting: Cardiology

## 2019-11-29 VITALS — BP 125/62 | HR 52 | Resp 15 | Ht 68.0 in | Wt 189.0 lb

## 2019-11-29 DIAGNOSIS — E7800 Pure hypercholesterolemia, unspecified: Secondary | ICD-10-CM

## 2019-11-29 DIAGNOSIS — I10 Essential (primary) hypertension: Secondary | ICD-10-CM

## 2019-11-29 DIAGNOSIS — I739 Peripheral vascular disease, unspecified: Secondary | ICD-10-CM

## 2019-11-29 DIAGNOSIS — I2510 Atherosclerotic heart disease of native coronary artery without angina pectoris: Secondary | ICD-10-CM

## 2019-11-29 NOTE — Progress Notes (Signed)
Primary Physician/Referring:  [name], MD  Patient ID: Brian Brock, male    DOB: 1946-03-20, 74 y.o.   MRN: [id number]  Chief Complaint  Patient presents with  . Hypertension  . Coronary Artery Disease  . PAD  . Follow-up    6 week   HPI:    Brian Brock  is a 74 y.o. Caucasian male with CAD with multiple angioplasties over the years, due to restenosis underwent CABG on 09/12/2012 but had recurrence of angina and LIMA occluded needing LAD stenting in 2016.  Past medical history significant for hypertension, hyperglycemia, hyperlipidemia, hypothyroidism, peripheral vascular disease with bilateral SFA stenting and angioplasty, right femoral endarterectomy and left external iliac artery stent in past, repeat stenting of the left common iliac artery and lithotripsy followed by drug-coated balloon angioplasty of the left common femoral artery on 09/05/2019, spontaneous DVT and pulmonary embolism in January 2020 and has been on Xarelto since then.  He has chronic dyspnea on exertion and COPD with ongoing tobacco use.  He presents here for a 31-month office visit.  States that he is doing well and blood pressures well controlled and is tolerating all his medications well and has noticed significant improvement in symptoms of claudication.  No specific symptoms today.  Unfortunately still continues to smoke.  Past Medical History:  Diagnosis Date  . AF (atrial fibrillation) ([place])    happened after CABG, has not had it since  . Allergic rhinitis   . Allergy   . Anginal pain ([place]) 1997  . Arthritis    "lower back" (09/26/2015)  . Childhood asthma   . Chronic bronchitis ([place])    Yearly  . COPD (chronic obstructive pulmonary disease) ([place])   . Coronary artery disease    Multiple stents  . Dyslipidemia   . Dysrhythmia   . Femoral hernia of right side 12/28/2016  . GERD (gastroesophageal reflux disease)   . History of hiatal hernia   . Hyperlipemia   . Hypertension   .  Hypothyroidism   . Irritable bowel syndrome   . Myocardial infarct ([place]) 1997   "mild"  . PE (pulmonary thromboembolism) ([place]) 05/2018  . Peripheral vascular disease ([place])    prior stenting  . Restless legs    Past Surgical History:  Procedure Laterality Date  . CARDIAC CATHETERIZATION  2004   had remote tandem stents in 1997 and 1998 for LAD lesions, RCA stent in 1997 , midprto=ion, and a proximal RCA  SENT IN 1999, all DES; some were IVIS  guide.  [name] CARDIAC CATHETERIZATION  09/07/2012   demonstrated severe 2- vessel disease,patent LAD,patient circ and patent right stents, but 90% ostial LAD whic was new.  . CAROTID DOPPLER  100/24/11   RGT BULB and PROXIMAL ICA 0-49%; LFT. ICA  0-49 ; RGT SUBCLAVIAN ARTERY < 50% ; LFT SUBCLAVIAN normal patency; RGT  and LFT  VERTEBRAL ARTERIES  . COLONOSCOPY  11/06/2016   Diverticulosis of colon. Internal hemorrhoids.   . CORONARY ANGIOPLASTY    . CORONARY ANGIOPLASTY WITH STENT PLACEMENT  '97, '98, '04, '06, '08, '10   seven cardiac stents  . CORONARY ARTERY BYPASS GRAFT N/A 09/12/2012   Procedure: OFF PUMP CORONARY ARTERY BYPASS GRAFTING (CABG);  Surgeon: Melrose Nakayama, MD;  Location: [facility];  Service: Open Heart Surgery;  Laterality: N/A;  Times two using left internal mammary artery and endoscopically harvested right saphenous vein  ;LIMA-LAD and SVG -OM1  . DOPPLER ECHOCARDIOGRAPHY  11/23/2012,09/30/2010  LV EF 50-55%----09/30/2010-LV EF = >55%  . ENDARTERECTOMY FEMORAL Right 12/19/2015   Procedure: ENDARTERECTOMY RIGHT FEMORAL ARTERY;  Surgeon: Serafina Mitchell, MD;  Location: [facility];  Service: Vascular;  Laterality: Right;  . event monitor  11/04/2012-11/17/2012   pt denies this hx on 07/24/2014  . FEMORAL ARTERY STENT Bilateral 7/06  8/08   Lt SFA '06, Rt SFA 8/08  . HERNIA REPAIR  10/2008   "repaired w/gallbladder OR"  . HERNIA REPAIR  07/2016  . INGUINAL HERNIA REPAIR Right 12/28/2016   Procedure: OPEN REPAIR RIGHT GROIN HERNIA WITH  MESH;  Surgeon: Fanny Skates, MD;  Location: [facility] [place];  Service: General;  Laterality: Right;  . INTRAVASCULAR LITHOTRIPSY  09/05/2019   Procedure: INTRAVASCULAR LITHOTRIPSY;  Surgeon: Adrian Prows, MD;  Location: [facility];  Service: Cardiovascular;;  left common femoral  . LAPAROSCOPIC CHOLECYSTECTOMY  June 2010  . LEFT HEART CATHETERIZATION WITH CORONARY ANGIOGRAM N/A 09/07/2012   Procedure: LEFT HEART CATHETERIZATION WITH CORONARY ANGIOGRAM;  Surgeon: Troy Sine, MD;  Location: [facility] CATH LAB;  Service: Cardiovascular;  Laterality: N/A;  . LEFT HEART CATHETERIZATION WITH CORONARY/GRAFT ANGIOGRAM N/A 07/24/2014   Procedure: LEFT HEART CATHETERIZATION WITH Beatrix Fetters;  Surgeon: Troy Sine, MD; LAD 70%, 50% ISR, mLAD 99%, CFX 30%, patent stent, RCA patent, LIMA-LAD occluded, SVG-OM 1 patent, EF 50-55%  . LEXISCAN STRESS TEST  09/06/2012  . LOWER ARTERIAL DOPPLER  03/25/2012   FGT CIA 0-49%; RGT CFA AND SFA AT STENT 0-49%; RGT DISTAL SFA = <50%; LFT. EIA  50-69%; LFT. CFA 0-49% ;;LFT. SFA  AT STENT NORMAL PATENCY  . LOWER EXTREMITY ANGIOGRAPHY N/A 09/05/2019   Procedure: LOWER EXTREMITY ANGIOGRAPHY;  Surgeon: Adrian Prows, MD;  Location: [place] [facility];  Service: Cardiovascular;  Laterality: N/A;  . NM MYOVIEW LTD  05/06/2012   EF -STUDY NOT GATED, NORMAL STRESS NUCLEAR ,NORMAL LV FUNCTION  . PATCH ANGIOPLASTY Right 12/19/2015   Procedure: PATCH ANGIOPLASTY RIGHT FEMORAL ARTERY;  Surgeon: Serafina Mitchell, MD;  Location: [facility];  Service: Vascular;  Laterality: Right;  . PERCUTANEOUS CORONARY STENT INTERVENTION (PCI-S) Right 07/24/2014   Procedure: PERCUTANEOUS CORONARY STENT INTERVENTION (PCI-S);  Surgeon: Troy Sine, MD; ostial LAD PTCA with angioscope scoring balloon, 3.0 x 34 mm Resolute DES extending from the ostium to the mid LAD, covering all significant stenoses   . PERIPHERAL VASCULAR CATHETERIZATION N/A 09/26/2015   Procedure: Abdominal Aortogram w/Lower Extremity;   Surgeon: Lorretta Harp, MD;  Location: [facility];  Service: Cardiovascular;  Laterality: N/A;  . PERIPHERAL VASCULAR CATHETERIZATION Right 02/20/2016   Procedure: Peripheral Vascular Atherectomy;  Surgeon: Lorretta Harp, MD;  Location: [facility];  Service: Cardiovascular;  Laterality: Right;  SFA  . PERIPHERAL VASCULAR CATHETERIZATION Right 02/20/2016   Procedure: Peripheral Vascular Balloon Angioplasty;  Surgeon: Lorretta Harp, MD;  Location: [facility];  Service: Cardiovascular;  Laterality: Right;  SFA  . PERIPHERAL VASCULAR INTERVENTION  09/05/2019   Procedure: PERIPHERAL VASCULAR INTERVENTION;  Surgeon: Adrian Prows, MD;  Location: [place] [facility];  Service: Cardiovascular;;  left common iliac  . TONSILLECTOMY  1950's  . UPPER GASTROINTESTINAL ENDOSCOPY     Family History  Problem Relation Age of Onset  . Coronary artery disease Father 30  . Heart attack Father   . Coronary artery disease Brother 55       CABG in 2000  . Deep vein thrombosis Brother   . Colon cancer Neg Hx   .  Esophageal cancer Neg Hx   . Rectal cancer Neg Hx   . Stomach cancer Neg Hx     Social History   Tobacco Use  . Smoking status: Current Some Day Smoker    Packs/day: 0.50    Years: 56.00    Pack years: 28.00    Types: Cigarettes    Last attempt to quit: 03/25/2016    Years since quitting: 3.6  . Smokeless tobacco: Current User    Types: Chew  Substance Use Topics  . Alcohol use: Yes    Alcohol/week: 1.0 standard drink    Types: 1 Standard drinks or equivalent per week    Comment: 09/26/2015 "varies; might have a few drinks q 2 months"   Marital status: Married   ROS   Review of Systems  Constitutional: Positive for malaise/fatigue.  Cardiovascular: Positive for dyspnea on exertion. Negative for chest pain, claudication, leg swelling and syncope.  Musculoskeletal: Positive for arthritis, back pain and joint pain (left toes pain and swelling).  Gastrointestinal:  Negative for melena.   Objective  Blood pressure 125/62, pulse (!) 52, resp. rate 15, height 5\' 8"  (1.727 m), weight 189 lb (85.7 kg), SpO2 95 %.  Vitals with BMI 11/29/2019 10/20/2019 09/12/2019  Height 5\' 8"  5\' 8"  5\' 9"   Weight 189 lbs 193 lbs 2 oz 194 lbs 8 oz  BMI 28.74 81.44 81.85  Systolic 631 497 026  Diastolic 62 62 56  Pulse 52 52 62     Physical Exam Constitutional:      Appearance: He is well-developed.  Cardiovascular:     Rate and Rhythm: Normal rate and regular rhythm.     Pulses:          Carotid pulses are 2+ on the right side with bruit and 2+ on the left side.      Femoral pulses are 2+ on the right side with bruit and 2+ on the left side with bruit.      Popliteal pulses are 1+ on the right side and 2+ on the left side.       Dorsalis pedis pulses are 2+ on the right side and 1+ on the left side.       Posterior tibial pulses are 0 on the right side and 1+ on the left side.     Heart sounds: Normal heart sounds.     Comments: No JVD. Left foot mild 1-2+ pitting edema. Foot is warm without signs of inflammation or any skin changes suggestive of embolic complications. No venous engorgement. Left leg is warm, non tender without edema. Bilateral superficial varicosities noted.  Pulmonary:     Breath sounds: Rales (bialteral left base worse than right. Diffuse scattered) present.  Abdominal:     General: Bowel sounds are normal.     Palpations: Abdomen is soft.    Laboratory examination:   Recent Labs    01/05/19 0922 06/15/19 1052 08/31/19 1436  NA 141 143 143  K 3.7 3.9 5.0  CL 106 108 106  CO2 29 29 24   GLUCOSE 124* 168* 100*  BUN 16 15 21   CREATININE 1.09 1.06 1.22  CALCIUM 8.4* 8.9 9.3  GFRNONAA >60 >60 58*  GFRAA >60 >60 68   CrCl cannot be calculated (Patient's most recent lab result is older than the maximum 21 days allowed.).  CMP Latest Ref Rng & Units 08/31/2019 06/15/2019 01/05/2019  Glucose 65 - 99 mg/dL 100(H) 168(H) 124(H)  BUN 8 - 27 mg/dL  21 15  16  Creatinine 0.76 - 1.27 mg/dL 1.22 1.06 1.09  Sodium 134 - 144 mmol/L 143 143 141  Potassium 3.5 - 5.2 mmol/L 5.0 3.9 3.7  Chloride 96 - 106 mmol/L 106 108 106  CO2 20 - 29 mmol/L 24 29 29   Calcium 8.6 - 10.2 mg/dL 9.3 8.9 8.4(L)  Total Protein 6.5 - 8.1 g/dL - 6.3(L) 5.7(L)  Total Bilirubin 0.3 - 1.2 mg/dL - 0.6 0.5  Alkaline Phos 38 - 126 U/L - 75 78  AST 15 - 41 U/L - 21 19  ALT 0 - 44 U/L - 21 17   CBC Latest Ref Rng & Units 08/31/2019 06/15/2019 01/05/2019  WBC 3.4 - 10.8 x10E3/uL 9.9 8.0 8.8  Hemoglobin 13.0 - 17.7 g/dL 14.5 14.5 14.0  Hematocrit 37.5 - 51.0 % 42.5 44.0 42.7  Platelets 150 - 450 x10E3/uL 217 230 212   Lipid Panel     Component Value Date/Time   CHOL 131 04/06/2019 1002   TRIG 148 04/06/2019 1002   HDL 36 (L) 04/06/2019 1002   CHOLHDL 2.9 02/07/2018 0915   CHOLHDL 3.0 09/06/2012 0455   VLDL 36 09/06/2012 0455   LDLCALC 69 04/06/2019 1002   HEMOGLOBIN A1C Lab Results  Component Value Date   HGBA1C 6.0 (H) 04/06/2019   MPG 140 (H) 09/05/2012   TSH Recent Labs    04/06/19 1002  TSH 2.430   External Labs:   Medications and allergies   Allergies  Allergen Reactions  . Other Anaphylaxis, Shortness Of Breath and Swelling    [place] nuts- "will kill me"  . Clarithromycin Nausea Only  . Fenofibrate Micronized Other (See Comments)    Caused weakness/lethargy  . Metoprolol Other (See Comments)    Fatigue and malaise  . Tricor [Fenofibrate] Other (See Comments)    Tired/weak  . Fish Oil Rash  . Levofloxacin Other (See Comments)    Doesn't remember  . Meloxicam Other (See Comments)    Achy, fatigue  . Metaxalone Other (See Comments)    Doesn't remember  . Niaspan [Niacin] Other (See Comments)    Doesn't remember  . Sulfadiazine Hives and Rash     Current Outpatient Medications  Medication Instructions  . acetaminophen (TYLENOL) 500-1,000 mg, Oral, Every 6 hours PRN  . aspirin EC 81 mg, Oral, Daily at bedtime  . BYSTOLIC 5 MG  tablet TAKE 1/2 TABLET ONCE DAILY  . cetirizine (ZYRTEC) 10 mg, Oral, Daily  . cholestyramine light (PREVALITE) 4 g, Oral, Every evening  . cyclobenzaprine (FLEXERIL) 10 mg, Oral, 3 times daily PRN  . finasteride (PROSCAR) 5 mg, Oral, Daily  . ipratropium (ATROVENT) 0.03 % nasal spray 1 spray, Each Nare, Daily PRN  . levothyroxine (SYNTHROID) 25 mcg, Oral, Daily before breakfast  . loperamide (IMODIUM A-D) 2 mg, Oral, 4 times daily PRN  . losartan (COZAAR) 100 mg, Oral, Daily  . Melatonin 12 mg, Oral, Daily at bedtime  . Multiple Vitamin (MULTIVITAMIN WITH MINERALS) TABS tablet 1 tablet, Oral, Daily, One-A-Day for Men 50+  . nitroGLYCERIN (NITROSTAT) 0.4 MG SL tablet PLACE 1 TABLET UNDER THE TONGUE, MAY REPEAT EVERY 5 MINUTES MAX 3 TABLETS  . pantoprazole (PROTONIX) 40 mg, Oral, 2 times daily  . Probiotic Product (PROBIOTIC PO) 1 capsule, Oral, Daily  . rivaroxaban (XARELTO) 20 mg, Oral, Daily with supper  . rosuvastatin (CRESTOR) 20 MG tablet TAKE ONE TABLET BY MOUTH EVERY DAY  . vitamin C 1,000 mg, Oral, 2 times daily  . Vitamin D3 2,000 Units, Oral,  Daily  . Zinc 50 mg, Oral, 2 times daily  . zolpidem (AMBIEN) 10 mg, Oral, At bedtime PRN   Radiology:   No results found.  Cardiac Studies:   Coronary angiogram and stenting to his LAD and RCA  in 1997, 1998, and in 2004 to his proximal left circumflex coronary artery.  Due to recurrent restenosis underwent CABG on 09/12/2012 with off-pump LIMA to LAD and SVG to OM1.  CABG  by Dr. Roxan Hockey on 09/12/2012 with an off-pump LIMA to LAD (Occluded by angoigram in 2016) and SVG to circumflex marginal 1 vessel.  Coronary angiogram 07/24/2014: High-grade stenosis to the proximal LAD, patent circumflex stent, patent SVG to OM1 however LIMA to LAD occluded.  Successful stenting to proximal LAD with 3.0 x 34 mm resolute DES, postdilated to 3.4 mm.  Peripheral arteriogram: Stenting to his left SFA in 2004 and right SFA in 2008.  Stenting of left  external iliac artery, right common femoral artery endarterectomy and patch angioplasty in August 2017 with staged right SFA atherectomy.  Abdominal aortic and iliac duplex 11/03/2018: Stenosis: +--------------------+-------------+---------------+  Location      Stenosis   Stent       +--------------------+-------------+---------------+  Right Common Iliac <50% stenosis          +--------------------+-------------+---------------+  Left Common Iliac  <50% stenosis          +--------------------+-------------+---------------+  Right External Iliac<50% stenosis          +--------------------+-------------+---------------+  Left External Iliac        50-99% stenosis  +--------------------+-------------+---------------+   Lower extremity arterial duplex 11/02/2018: Right: Atherosclerosis throughout the right lower extremity without focal stenosis.  Patent mid-distal SFA stent with 1-49% stenosis.  Left: Atherosclerosis throughout the left lower extremity.  Patent distal SFA stent with 1-49% stenosis.   Carotid artery duplex  08/07/2019:  Stenosis in the right internal carotid artery (16-49%). Stenosis in the  right external carotid artery (<50%).  Stenosis in the left internal carotid artery (16-49%). Stenosis in the  left common carotid artery (<50%).  Antegrade right vertebral artery flow. Antegrade left vertebral artery  flow.  Follow up in one year is appropriate if clinically indicated.   Echocardiogram 08/07/2019:  Normal LV systolic function with visual EF 50-55%. Left ventricle cavity  is normal in size. Normal global wall motion. No obvious regional wall  motion abnormalities. Mild concentric hypertrophy of the left ventricle.  Indeterminate diastolic filling pattern, normal LAP. Calculated EF 50%.  Left atrial cavity is severely dilated.  Visually, right atrial cavity is mildly dilated.  Mild mitral regurgitation.   Moderate tricuspid regurgitation. No evidence of pulmonary hypertension. RVSP measures 33 mmHg.  Mild pulmonic regurgitation.  IVC is dilated with a respiratory response of >50%.  No significant change compared to previous study 05/25/2018.   Lexiscan (Walking with mod Bruce)Tetrofosmin Stress Test  08/07/2019: Nondiagnostic ECG stress. Perfusion imaging study is abnormal.  Mild inferior, inferolateral small sized defect consistent with ischemia. Left ventricular systolic function is mildly depressed, LVEF calculated at 49% with inferior hypokinesis. Compared to 10/22/2016 report- normal study.  Intermediate risk study.   Peripheral arteriogram 09/05/19: Abdominal aortogram revealed presence of 2 renal arteries 1 on either sides, widely patent.  Mild atherosclerotic changes abdominal aorta noted.  Bilateral aortoiliac bifurcation was widely patent with mild calcification.  There was diffuse moderate calcification noted throughout the entire iliac artery system.  Bilateral internal iliac arteries are occluded.  There is a old left external iliac artery stent which  has a 40 to 50% in-stent restenosis focally without pressure gradient.  Left common iliac artery has a 80% stenosis with a 20 to 30 mm pressure gradient. Left common femoral artery is very long and profunda bifurcation happens distal than normal, the left common femoral artery has a calcific 80 to 90% stenosis, brisk flow in the left SFA and old left SFA stent is widely patent.  Below the left knee there is two-vessel runoff with occluded left PT.  Right common femoral artery patch angioplasty site is widely patent.  Right SFA shows mild diffuse disease with moderate amount of calcification.  Previously placed right mid SFA stent is widely patent.  There is three-vessel runoff below the right knee.  Intervention data: 1. Left common femoral artery: PTA with Shockwave lithotripsy with a 7.0 x 60 mm shockwave balloon followed by  drug-coated balloon angioplasty with a 8 mm x 40 mm Lutonix angioplasty, stenosis reduced from 85% to less than 10% with brisk flow. 2. PTA and self-expanding 10.0 x 40 mm absolute Pro stent to the left common iliac artery followed by post angioplasty with a 9.0 x 40 mm Mustang at 8 atmospheric pressure for 60 seconds, stenosis reduced from 80% to 0%.  There was also left external iliac artery old stent that had a 50% stenosis was also stented with the same self-expanding stent overlapped followed by balloon angioplasty.  Overall no residual stenosis.  ABI 09/26/2019:  This exam reveals mildly decreased perfusion of the right lower extremity, noted at the anterior tibial artery level (ABI 0.83) and mildly decreased  perfusion of the left lower extremity, noted at the anterior tibial artery level (ABI 0.86). Compared to 08/31/2019, bilateral ABI 0.81. (H/O Left CIA stent and left CFA angioplasty).  EKG:   07/26/2019: Marked sinus bradycardia at 48 bpm, normal axis.  Borderline low voltage complexes.  No evidence of ischemia.    Assessment     ICD-10-CM   1. Claudication in peripheral vascular disease (HCC)  I73.9   2. Coronary artery disease involving native coronary artery of native heart without angina pectoris  I25.10   3. Hypercholesteremia  E78.00   4. Essential hypertension  I10     Recommendations:   Brian Brock  is a 74 y.o. Caucasian male with CAD with multiple angioplasties over the years, due to restenosis underwent CABG on 09/12/2012 but had recurrence of angina and LIMA occluded needing LAD stenting in 2016.  Past medical history significant for hypertension, hyperglycemia, hyperlipidemia, hypothyroidism, spontaneous DVT and pulmonary embolism in January 2020 and has been on Xarelto since then.  He has chronic dyspnea on exertion and COPD with ongoing tobacco use.  PAD significant for left external iliac artery and left SFA stenting 2014, left common iliac artery stenting and  left common femoral artery shockwave lithotripsy followed by [facility] on 09/05/2019.  He is stable from cardiac standpoint without recurrence of angina pectoris or heart failure. His PAD is stable, he also has pseudoclaudication with back pain and spinal stenosis and degenerative joint disease as well.  Smoking cessation again discussed.  Tolerating Crestor 10 mg, lipids are well controlled, blood pressure is also very well controlled.  Continue present medical therapy.  With regard to his chronic fatigue, I am suspecting he has sleep apnea.  With sleep apnea which I suspect, he has an appointment coming up soon.  Patient also notices by taking Synthroid he feels markedly fatigued and weak and also notices his blood pressure to be low around  100 to 194 mmHg systolic.  Advised him to restart taking Synthroid as he has discontinued this for 2 weeks, and to monitor his blood pressure, if blood pressures remain low it may explain his fatigue.  I may then have to reduce losartan dose.  However if there is no correlation with blood pressure and Synthroid, he needs to discuss this with his PCP.  I will see him back in 6 months for follow-up.   Adrian Prows, MD, [facility] 11/30/2019, 10:22 PM Office: [phone]

## 2019-12-06 ENCOUNTER — Ambulatory Visit: Payer: Self-pay | Admitting: General Surgery

## 2019-12-06 NOTE — H&P (Signed)
History of Present Illness Ralene Ok MD; 12/06/2019 2:03 PM) The patient is a 74 year old male who presents with an inguinal hernia. Patient is a 74 year old male comes back in secondary to recurrent right inguinal hernia. Patient had a previous open repair of a large right inguinal hernia that subsequently had a femoral hernia component. Patient states that over the last year he states that bulge return. He states he does have some pain to the right inguinal area. States he's had the ability to reduce the hernia when he lays down. He does state that bit larger.  Patient had a recent CT scan in February which I reviewed personally. CT scan does show that he has a recurrent right hernia  Patient recently saw Dr. Nadyne Coombes, he is currently on Plavix and Xarelto for his cardiac CAD.  ----------------------------------------- Elenore Rota presents for follow for recurrent right inguinal hernia. He initially had a open repair of large right inguinal hernia with mesh by Dr. Dalbert Batman in August 2018. He developed a small recurrence and I saw him for that but he wasn't having a lot of symptoms. He returns now as he started to have some more pain both directly at the site of the small bulge as well as in his right testicle. His right testicle does not swell. The hernia spontaneously reduces. Of note, he is undergoing a cardiac and sleep study workup by Dr. Einar Gip to see why he has decreased energy.   Past Surgical History (Chanel Teressa Senter, CMA; 12/06/2019 1:42 PM) Bypass Surgery for Poor Blood Flow to Legs  Carotid Artery Surgery  Bilateral. Cataract Surgery  Bilateral. Colon Polyp Removal - Colonoscopy  Gallbladder Surgery - Open  Open Inguinal Hernia Surgery  Right. Oral Surgery  Shoulder Surgery  Left.  Allergies (Chanel Teressa Senter, CMA; 12/06/2019 1:42 PM) SulfADIAZINE *Sulfonamides*  Metoprolol Tartrate *BETA BLOCKERS*  LevoFLOXacin *FLUOROQUINOLONES*  Meloxicam *ANALGESICS -  ANTI-INFLAMMATORY*  Niaspan *ANTIHYPERLIPIDEMICS*  Tricor *ANTIHYPERLIPIDEMICS*  Metaxalone *MUSCULOSKELETAL THERAPY AGENTS*  Fish Oil *NUTRIENTS*  Allergies Reconciled   Medication History (Chanel Nolan, CMA; 12/06/2019 1:44 PM) Zinc (50MG  Tablet, Oral) Active. Zolpidem Tartrate (10MG  Tablet, Oral) Active. Xarelto (20MG  Tablet, Oral) Active. Cozaar (100MG  Tablet, Oral) Active. Plavix (75MG  Tablet, Oral) Active. Proscar (5MG  Tablet, Oral) Active. Vitamin C (Oral) Specific strength unknown - Active. Aspirin (81MG  Tablet, Oral) Active. ZyrTEC (Oral) Specific strength unknown - Active. Medications Reconciled  Social History Antonietta Jewel, CMA; 12/06/2019 1:42 PM) Alcohol use  Occasional alcohol use. Caffeine use  Carbonated beverages, Tea, Coffee. No drug use  Tobacco use  Current some day smoker, Former smoker.  Family History ([place], [place]; 12/06/2019 1:42 PM) Alcohol Abuse  Father. Cancer  Brother, Father. Heart Disease  Brother, Father. Respiratory Condition  Father, Mother.  Other Problems (Chanel Teressa Senter, CMA; 12/06/2019 1:42 PM) Arthritis  Asthma  Atrial Fibrillation  Back Pain  Chronic Obstructive Lung Disease  Emphysema Of Lung  Gastroesophageal Reflux Disease  Inguinal Hernia  Myocardial infarction  Pulmonary Embolism / Blood Clot in Legs  Vascular Disease     Review of Systems (Chanel Nolan CMA; 12/06/2019 1:42 PM) General Not Present- Appetite Loss, Chills, Fatigue, Fever, Night Sweats, Weight Gain and Weight Loss. Skin Not Present- Change in Wart/Mole, Dryness, Hives, Jaundice, New Lesions, Non-Healing Wounds, Rash and Ulcer. HEENT Present- Ringing in the Ears and Seasonal Allergies. Not Present- Earache, Hearing Loss, Hoarseness, Nose Bleed, Oral Ulcers, Sinus Pain, Sore Throat, Visual Disturbances, Wears glasses/contact lenses and Yellow Eyes. Respiratory Present- Snoring and Wheezing. Not Present- Bloody sputum,  Chronic  Cough and Difficulty Breathing. Cardiovascular Not Present- Chest Pain, Difficulty Breathing Lying Down, Leg Cramps, Palpitations, Rapid Heart Rate, Shortness of Breath and Swelling of Extremities. Gastrointestinal Present- Change in Bowel Habits and Hemorrhoids. Not Present- Abdominal Pain, Bloating, Bloody Stool, Chronic diarrhea, Constipation, Difficulty Swallowing, Excessive gas, Gets full quickly at meals, Indigestion, Nausea, Rectal Pain and Vomiting. Male Genitourinary Present- Impotence and Urine Leakage. Not Present- Blood in Urine, Change in Urinary Stream, Frequency, Nocturia, Painful Urination and Urgency.  Vitals (Chanel Nolan CMA; 12/06/2019 1:44 PM) 12/06/2019 1:44 PM Weight: 187 lb Height: 69in Body Surface Area: 2.01 m Body Mass Index: 27.61 kg/m  Temp.: 97.85F  Pulse: 63 (Regular)  BP: 124/60(Sitting, Left Arm, Standard)       Physical Exam Ralene Ok, MD; 12/06/2019 2:4 PM) General Note: No distress   Integumentary Note: Warm and dry   Chest and Lung Exam Note: Clear to auscultation bilaterally   Cardiovascular Note: Regular rate and rhythm, no significant peripheral edema   Abdomen Note: Soft, nontender, no HSM   Male Genitourinary Note: Recurrent right inguinal hernia towards the mid to lateral portion of his scar. Easily reduces. Does not extend into the scrotum. No testicular edema. No left inguinal hernia   Musculoskeletal Note: No deformity or tenderness     Assessment & Plan Ralene Ok MD; 12/06/2019 2:04 PM) RECURRENT RIGHT INGUINAL HERNIA (K40.91) Impression: Patient is a 74 year old male who comes in secondary to her recurrent right inguinal hernia. Patient sees Dr. Nadyne Coombes his cardiologist. We will send for direction to have his Xarelto on Plavix held prior to surgery.  1. The patient will like to proceed to the operating room for laparoscopic right inguinal hernia repair with mesh.  2. I discussed  with the patient the signs and symptoms of incarceration and strangulation and the need to proceed to the ER should they occur.  3. I discussed with the patient the risks and benefits of the procedure to include but not limited to: Infection, bleeding, damage to surrounding structures, possible need for further surgery, possible nerve pain, and possible recurrence. The patient was understanding and wishes to proceed.

## 2019-12-07 ENCOUNTER — Encounter: Payer: Self-pay | Admitting: Cardiology

## 2019-12-08 ENCOUNTER — Encounter: Payer: Self-pay | Admitting: Cardiology

## 2019-12-12 ENCOUNTER — Inpatient Hospital Stay: Payer: Medicare Other | Attending: Hematology | Admitting: Family

## 2019-12-12 ENCOUNTER — Inpatient Hospital Stay: Payer: Medicare Other

## 2019-12-12 ENCOUNTER — Other Ambulatory Visit: Payer: Self-pay

## 2019-12-12 ENCOUNTER — Encounter: Payer: Self-pay | Admitting: Family

## 2019-12-12 VITALS — BP 117/54 | HR 50 | Temp 97.9°F | Resp 18 | Ht 68.0 in | Wt 187.1 lb

## 2019-12-12 DIAGNOSIS — Z86711 Personal history of pulmonary embolism: Secondary | ICD-10-CM | POA: Insufficient documentation

## 2019-12-12 DIAGNOSIS — I2699 Other pulmonary embolism without acute cor pulmonale: Secondary | ICD-10-CM

## 2019-12-12 DIAGNOSIS — Z86718 Personal history of other venous thrombosis and embolism: Secondary | ICD-10-CM | POA: Diagnosis not present

## 2019-12-12 DIAGNOSIS — I824Y1 Acute embolism and thrombosis of unspecified deep veins of right proximal lower extremity: Secondary | ICD-10-CM

## 2019-12-12 DIAGNOSIS — Z7901 Long term (current) use of anticoagulants: Secondary | ICD-10-CM | POA: Insufficient documentation

## 2019-12-12 LAB — CMP (CANCER CENTER ONLY)
ALT: 14 U/L (ref 0–44)
AST: 17 U/L (ref 15–41)
Albumin: 4 g/dL (ref 3.5–5.0)
Alkaline Phosphatase: 85 U/L (ref 38–126)
Anion gap: 7 (ref 5–15)
BUN: 22 mg/dL (ref 8–23)
CO2: 25 mmol/L (ref 22–32)
Calcium: 9.2 mg/dL (ref 8.9–10.3)
Chloride: 108 mmol/L (ref 98–111)
Creatinine: 1.07 mg/dL (ref 0.61–1.24)
GFR, Est AFR Am: >60 mL/min (ref >60)
GFR, Estimated: >60 mL/min (ref >60)
Glucose, Bld: 135 mg/dL — ABNORMAL HIGH (ref 70–99)
Potassium: 3.6 mmol/L (ref 3.5–5.1)
Sodium: 140 mmol/L (ref 135–145)
Total Bilirubin: 0.5 mg/dL (ref 0.3–1.2)
Total Protein: 6.3 g/dL — ABNORMAL LOW (ref 6.5–8.1)

## 2019-12-12 LAB — CBC WITH DIFFERENTIAL (CANCER CENTER ONLY)
Abs Immature Granulocytes: 0.02 10*3/uL (ref 0.00–0.07)
Basophils Absolute: 0 10*3/uL (ref 0.0–0.1)
Basophils Relative: 0 %
Eosinophils Absolute: 0.3 10*3/uL (ref 0.0–0.5)
Eosinophils Relative: 3 %
HCT: 41.1 % (ref 39.0–52.0)
Hemoglobin: 14 g/dL (ref 13.0–17.0)
Immature Granulocytes: 0 %
Lymphocytes Relative: 25 %
Lymphs Abs: 2.3 10*3/uL (ref 0.7–4.0)
MCH: 29.3 pg (ref 26.0–34.0)
MCHC: 34.1 g/dL (ref 30.0–36.0)
MCV: 86 fL (ref 80.0–100.0)
Monocytes Absolute: 0.7 10*3/uL (ref 0.1–1.0)
Monocytes Relative: 8 %
Neutro Abs: 5.8 10*3/uL (ref 1.7–7.7)
Neutrophils Relative %: 64 %
Platelet Count: 196 10*3/uL (ref 150–400)
RBC: 4.78 MIL/uL (ref 4.22–5.81)
RDW: 12.9 % (ref 11.5–15.5)
WBC Count: 9.1 10*3/uL (ref 4.0–10.5)
nRBC: 0 % (ref 0.0–0.2)

## 2019-12-12 NOTE — Progress Notes (Signed)
Hematology and Oncology Follow Up Visit  Brian Brock [id number] [birth year] 74 y.o. 12/12/2019   Principle Diagnosis:  Unprovoked RLE DVT and bilateral PTE  ? Extensive acute DVT involving the R femoral, popliteal, posterior tibial and peroneal vein on doppler ? Small bilateral PTE without evidence of RV strain on CTA chest; no malignancy on scans   Current Therapy:   Xarelto 20mg  daily - 05/24/2018 - present   Interim History:  Brian Brock is here today for follow-up. He is doing well but has been feeling fatigued.  He states that he is a night owl and does not sleep well. He is scheduled to see a sleep doctor next week and schedule a sleep study.  He is doing well on Xarelto. No bleeding, bruising or petechiae.  He measures his calves and states that his right is about an inch larger but denies swelling or pain.  No tenderness, numbness or tingling in his extremities.  Occasional mild SOB with over exertion and hot weather.  No fever, chills, n/v, cough, rash, dizziness, chest pain, palpitations, abdominal pain or changes in bowel or bladder habits.  He has maintained a good appetite and is staying well hydrated. His weight is stable.   ECOG Performance Status: 1 - Symptomatic but completely ambulatory  Medications:  Allergies as of 12/12/2019      Reactions   Other Anaphylaxis, Shortness Of Breath, Swelling   [place] nuts- "will kill me"   Clarithromycin Nausea Only   Fenofibrate Micronized Other (See Comments)   Caused weakness/lethargy   Metoprolol Other (See Comments)   Fatigue and malaise   Tricor [fenofibrate] Other (See Comments)   Tired/weak   Fish Oil Rash   Levofloxacin Other (See Comments)   Doesn't remember   Meloxicam Other (See Comments)   Achy, fatigue   Metaxalone Other (See Comments)   Doesn't remember   Niaspan [niacin] Other (See Comments)   Doesn't remember   Sulfadiazine Hives, Rash      Medication List       Accurate as of December 12, 2019  10:45 AM. If you have any questions, ask your nurse or doctor.        acetaminophen 500 MG tablet Commonly known as: TYLENOL Take 500-1,000 mg by mouth every 6 (six) hours as needed (for pain.).   aspirin EC 81 MG tablet Take 81 mg by mouth at bedtime.   Bystolic 5 MG tablet Generic drug: nebivolol TAKE 1/2 TABLET ONCE DAILY   cetirizine 10 MG tablet Commonly known as: ZYRTEC Take 10 mg by mouth daily.   cholestyramine light 4 g packet Commonly known as: PREVALITE Take 4 g by mouth every evening.   cyclobenzaprine 10 MG tablet Commonly known as: FLEXERIL Take 10 mg by mouth 3 (three) times daily as needed for muscle spasms.   finasteride 5 MG tablet Commonly known as: PROSCAR Take 5 mg by mouth daily.   ipratropium 0.03 % nasal spray Commonly known as: ATROVENT Place 1 spray into both nostrils daily as needed for allergies.   levothyroxine 25 MCG tablet Commonly known as: SYNTHROID Take 25 mcg by mouth daily before breakfast.   loperamide 2 MG tablet Commonly known as: IMODIUM A-D Take 2 mg by mouth 4 (four) times daily as needed for diarrhea or loose stools.   losartan 100 MG tablet Commonly known as: COZAAR Take 100 mg by mouth daily.   Melatonin 12 MG Tabs Take 12 mg by mouth at bedtime.   multivitamin with minerals Tabs  tablet Take 1 tablet by mouth daily. One-A-Day for Men 50+   nitroGLYCERIN 0.4 MG SL tablet Commonly known as: NITROSTAT PLACE 1 TABLET UNDER THE TONGUE, MAY REPEAT EVERY 5 MINUTES MAX 3 TABLETS What changed: See the new instructions.   pantoprazole 40 MG tablet Commonly known as: PROTONIX Take 1 tablet (40 mg total) by mouth 2 (two) times daily. What changed: when to take this   PROBIOTIC PO Take 1 capsule by mouth daily.   rivaroxaban 20 MG Tabs tablet Commonly known as: XARELTO Take 1 tablet (20 mg total) by mouth daily with supper.   rosuvastatin 20 MG tablet Commonly known as: CRESTOR TAKE ONE TABLET BY MOUTH EVERY  DAY What changed: how much to take   vitamin C 1000 MG tablet Take 1,000 mg by mouth in the morning and at bedtime.   Vitamin D3 50 MCG (2000 UT) Tabs Take 2,000 Units by mouth daily.   Zinc 50 MG Tabs Take 50 mg by mouth in the morning and at bedtime.   zolpidem 10 MG tablet Commonly known as: AMBIEN Take 10 mg by mouth at bedtime as needed (sleep.).       Allergies:  Allergies  Allergen Reactions  . Other Anaphylaxis, Shortness Of Breath and Swelling    [place] nuts- "will kill me"  . Clarithromycin Nausea Only  . Fenofibrate Micronized Other (See Comments)    Caused weakness/lethargy  . Metoprolol Other (See Comments)    Fatigue and malaise  . Tricor [Fenofibrate] Other (See Comments)    Tired/weak  . Fish Oil Rash  . Levofloxacin Other (See Comments)    Doesn't remember  . Meloxicam Other (See Comments)    Achy, fatigue  . Metaxalone Other (See Comments)    Doesn't remember  . Niaspan [Niacin] Other (See Comments)    Doesn't remember  . Sulfadiazine Hives and Rash    Past Medical History, Surgical history, Social history, and Family History were reviewed and updated.  Review of Systems: All other 10 point review of systems is negative.   Physical Exam:  vitals were not taken for this visit.   Wt Readings from Last 3 Encounters:  11/29/19 189 lb (85.7 kg)  10/20/19 193 lb 1.6 oz (87.6 kg)  09/12/19 194 lb 8 oz (88.2 kg)    Ocular: Sclerae unicteric, pupils equal, round and reactive to light Ear-nose-throat: Oropharynx clear, dentition fair Lymphatic: No cervical or supraclavicular adenopathy Lungs no rales or rhonchi, good excursion bilaterally Heart regular rate and rhythm, no murmur appreciated Abd soft, nontender, positive bowel sounds, no liver or spleen tip palpated on exam, no fluid wave  MSK no focal spinal tenderness, no joint edema Neuro: non-focal, well-oriented, appropriate affect Breasts: Deferred   Lab Results  Component Value Date    WBC 9.1 12/12/2019   HGB 14.0 12/12/2019   HCT 41.1 12/12/2019   MCV 86.0 12/12/2019   PLT 196 12/12/2019   Lab Results  Component Value Date   FERRITIN 135.1 11/03/2017   IRON 102 11/03/2017   IRONPCTSAT 27.5 11/03/2017   Lab Results  Component Value Date   RBC 4.78 12/12/2019   No results found for: KPAFRELGTCHN, LAMBDASER, KAPLAMBRATIO No results found for: IGGSERUM, IGA, IGMSERUM No results found for: Ronnald Ramp, A1GS, A2GS, Violet Baldy, MSPIKE, SPEI   Chemistry      Component Value Date/Time   NA 143 08/31/2019 1436   K 5.0 08/31/2019 1436   CL 106 08/31/2019 1436   CO2 24 08/31/2019 1436  BUN 21 08/31/2019 1436   CREATININE 1.22 08/31/2019 1436   CREATININE 1.06 06/15/2019 1052   CREATININE 1.05 02/12/2016 1008      Component Value Date/Time   CALCIUM 9.3 08/31/2019 1436   ALKPHOS 75 06/15/2019 1052   AST 21 06/15/2019 1052   ALT 21 06/15/2019 1052   BILITOT 0.6 06/15/2019 1052       Impression and Plan: Mr. 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  He is tolerating anticoagulation nicely and has had no issue so far with recurrence.  We will plan to see him again in another 6 months.  He can contact our office with any questions or concerns.   Laverna Peace, NP 7/27/202110:45 AM

## 2019-12-13 ENCOUNTER — Encounter: Payer: Self-pay | Admitting: Neurology

## 2019-12-13 ENCOUNTER — Ambulatory Visit (INDEPENDENT_AMBULATORY_CARE_PROVIDER_SITE_OTHER): Payer: Medicare Other | Admitting: Neurology

## 2019-12-13 VITALS — BP 112/57 | HR 50 | Ht 69.0 in | Wt 187.0 lb

## 2019-12-13 DIAGNOSIS — I2510 Atherosclerotic heart disease of native coronary artery without angina pectoris: Secondary | ICD-10-CM

## 2019-12-13 DIAGNOSIS — G478 Other sleep disorders: Secondary | ICD-10-CM | POA: Diagnosis not present

## 2019-12-13 DIAGNOSIS — D689 Coagulation defect, unspecified: Secondary | ICD-10-CM

## 2019-12-13 DIAGNOSIS — J449 Chronic obstructive pulmonary disease, unspecified: Secondary | ICD-10-CM

## 2019-12-13 DIAGNOSIS — I480 Paroxysmal atrial fibrillation: Secondary | ICD-10-CM | POA: Insufficient documentation

## 2019-12-13 DIAGNOSIS — R0683 Snoring: Secondary | ICD-10-CM

## 2019-12-13 NOTE — Patient Instructions (Signed)

## 2019-12-13 NOTE — Progress Notes (Signed)
SLEEP MEDICINE CLINIC    Provider:  Larey Seat, MD  Primary Care Physician:  [name], MD [place] [address]     Referring Provider: Dr Einar Gip, MD          Chief Complaint according to patient   Patient presents with:    . New Patient (Initial Visit)           HISTORY OF PRESENT ILLNESS:  Brian Brock is a 74 year- old Caucasian male patient seen here as a referral on 12/13/2019 from  for a new sleep evaluation. .  Chief concern according to patient :  " [name] reports that about 8 months ago he began feeling nonrefreshed, nonrestorative fatigued exhausted all the time.  When he was first evaluated in 2006 he had such mild apnea that CPAP was not needed but it was recommended to avoid supine sleep position.  He still feels that he averages enough sleep at night but that the sleep does not have the same restorative quality.   I have the pleasure of seeing Brian Brock today, a right-handed White or Caucasian male with a possible sleep disorder.  He  has a past medical history of AF (atrial fibrillation) ([place]), Allergic rhinitis, Allergy, Anginal pain ([place]) (1997), Arthritis, Childhood asthma, Chronic bronchitis ([place]), COPD (chronic obstructive pulmonary disease) ([place]), Coronary artery disease, Dyslipidemia, Dysrhythmia, Femoral hernia of right side (12/28/2016), GERD (gastroesophageal reflux disease), History of hiatal hernia, Hyperlipemia, Hypertension, Hypothyroidism, Irritable bowel syndrome, Myocardial infarct ([place]) (1997), PE (pulmonary thromboembolism) ([place]) (05/2018), Peripheral arterial-vascular disease ([place]), and hip pain- stents were placed. [name] He reported metoprolol caused him to be excessively fatigued and he had felt better once it was discontinued. Now he is again feeling fatigued. He has chronic insomnia, goes to bed as late as 2 AM and never feels refreshed.    The patient had the first sleep study in the year 2006  at [facility] with Dr. Maxwell Caul.  with a result of an AHI ( Apnea Hypopnea index) of 4.1/h. strongly supine dependent.   Sleep relevant medical history: Nocturia 2-3 times, Sleep walking,  Tonsillectomy, but no TBI, cervical spine surgery/no deviated septum .    Family medical /sleep history: no other family member on CPAP with OSA.    Social history:  Patient is retired from Event organiser and lives in a household with spouse- The patient currently  used to work in shifts( Presenter, broadcasting,) Pets are present. One dog.  Tobacco use- active- 1/2 ppd.  ETOH use ; socially, Caffeine intake in form of Tea ( 3-5 glasses daily) no energy drinks. Regular exercise in form of walking.     Sleep habits are as follows: The patient's dinner time is between 6-8 PM. The patient goes to bed at 12 midnight and watches Tv in bed, always takes Ambien- asleep by 2 AM and continues to sleep for 5 hours, wakes for bathroom breaks, the first time at 2-and again at 7.30 AM.   The preferred sleep position is right sided, with the support of  2 pillows.  Dreams are reportedly rare.  7.30-9.30 AM is the usual rise time. The patient wakes up spontaneously. He has mved to another bedroom.  Hereports not feeling refreshed or restored in AM, with symptoms such as dry mouth,  and residual fatigue.  Naps are taken infrequently, spontaneously - and  lasting from 10 to 30 minutes and are more  refreshing than nocturnal sleep.    Review of Systems: Out of a complete 14 system review, the patient complains of only the following symptoms, and all other reviewed systems are negative.:  Fatigue, sleepiness , witnessed snoring, fragmented sleep, Insomnia - chronic - on ambien since 2011. No GERD at this time.  Rhinitis.    How likely are you to doze in the following situations: 0 = not likely, 1 = slight chance, 2 = moderate chance, 3 = high chance   Sitting and Reading? Watching Television? Sitting inactive in a public  place (theater or meeting)? As a passenger in a car for an hour without a break? Lying down in the afternoon when circumstances permit? Sitting and talking to someone? Sitting quietly after lunch without alcohol? In a car, while stopped for a few minutes in traffic?   Total = 10/ 24 points   FSS endorsed at 45/ 63 points.   Social History   Socioeconomic History  . Marital status: Married    Spouse name: Not on file  . Number of children: 1  . Years of education: Not on file  . Highest education level: Not on file  Occupational History  . Not on file  Tobacco Use  . Smoking status: Current Some Day Smoker    Packs/day: 0.50    Years: 56.00    Pack years: 28.00    Types: Cigarettes    Last attempt to quit: 03/25/2016    Years since quitting: 3.7  . Smokeless tobacco: Current User    Types: Chew  Vaping Use  . Vaping Use: Never used  Substance and Sexual Activity  . Alcohol use: Yes    Alcohol/week: 1.0 standard drink    Types: 1 Standard drinks or equivalent per week    Comment: 09/26/2015 "varies; might have a few drinks q 2 months"  . Drug use: No  . Sexual activity: Not Currently  Other Topics Concern  . Not on file  Social History Narrative  . Not on file   Social Determinants of Health   Financial Resource Strain:   . Difficulty of Paying Living Expenses:   Food Insecurity:   . Worried About Charity fundraiser in the Last Year:   . Arboriculturist in the Last Year:   Transportation Needs:   . Film/video editor (Medical):   [name] Lack of Transportation (Non-Medical):   Physical Activity:   . Days of Exercise per Week:   . Minutes of Exercise per Session:   Stress:   . Feeling of Stress :   Social Connections:   . Frequency of Communication with Friends and Family:   . Frequency of Social Gatherings with Friends and Family:   . Attends Religious Services:   . Active Member of Clubs or Organizations:   . Attends Archivist Meetings:   [name]  Marital Status:     Family History  Problem Relation Age of Onset  . Coronary artery disease Father 1  . Heart attack Father   . Coronary artery disease Brother 14       CABG in 2000  . Deep vein thrombosis Brother   . Colon cancer Neg Hx   . Esophageal cancer Neg Hx   . Rectal cancer Neg Hx   . Stomach cancer Neg Hx     Past Medical History:  Diagnosis Date  . AF (atrial fibrillation) ([place])    happened after CABG, has not had it since  .  Allergic rhinitis   . Allergy   . Anginal pain ([place]) 1997  . Arthritis    "lower back" (09/26/2015)  . Childhood asthma   . Chronic bronchitis ([place])    Yearly  . COPD (chronic obstructive pulmonary disease) ([place])   . Coronary artery disease    Multiple stents  . Dyslipidemia   . Dysrhythmia   . Femoral hernia of right side 12/28/2016  . GERD (gastroesophageal reflux disease)   . History of hiatal hernia   . Hyperlipemia   . Hypertension   . Hypothyroidism   . Irritable bowel syndrome   . Myocardial infarct ([place]) 1997   "mild"  . PE (pulmonary thromboembolism) ([place]) 05/2018  . Peripheral vascular disease ([place])    prior stenting  . Restless legs     Past Surgical History:  Procedure Laterality Date  . CARDIAC CATHETERIZATION  2004   had remote tandem stents in 1997 and 1998 for LAD lesions, RCA stent in 1997 , midprto=ion, and a proximal RCA  SENT IN 1999, all DES; some were IVIS  guide.  [name] CARDIAC CATHETERIZATION  09/07/2012   demonstrated severe 2- vessel disease,patent LAD,patient circ and patent right stents, but 90% ostial LAD whic was new.  . CAROTID DOPPLER  100/24/11   RGT BULB and PROXIMAL ICA 0-49%; LFT. ICA  0-49 ; RGT SUBCLAVIAN ARTERY < 50% ; LFT SUBCLAVIAN normal patency; RGT  and LFT  VERTEBRAL ARTERIES  . COLONOSCOPY  11/06/2016   Diverticulosis of colon. Internal hemorrhoids.   . CORONARY ANGIOPLASTY    . CORONARY ANGIOPLASTY WITH STENT PLACEMENT  '97, '98, '04, '06, '08, '10   seven cardiac stents  .  CORONARY ARTERY BYPASS GRAFT N/A 09/12/2012   Procedure: OFF PUMP CORONARY ARTERY BYPASS GRAFTING (CABG);  Surgeon: Melrose Nakayama, MD;  Location: [facility];  Service: Open Heart Surgery;  Laterality: N/A;  Times two using left internal mammary artery and endoscopically harvested right saphenous vein  ;LIMA-LAD and SVG -OM1  . DOPPLER ECHOCARDIOGRAPHY  11/23/2012,09/30/2010    LV EF 50-55%----09/30/2010-LV EF = >55%  . ENDARTERECTOMY FEMORAL Right 12/19/2015   Procedure: ENDARTERECTOMY RIGHT FEMORAL ARTERY;  Surgeon: Serafina Mitchell, MD;  Location: [facility];  Service: Vascular;  Laterality: Right;  . event monitor  11/04/2012-11/17/2012   pt denies this hx on 07/24/2014  . FEMORAL ARTERY STENT Bilateral 7/06  8/08   Lt SFA '06, Rt SFA 8/08  . HERNIA REPAIR  10/2008   "repaired w/gallbladder OR"  . HERNIA REPAIR  07/2016  . INGUINAL HERNIA REPAIR Right 12/28/2016   Procedure: OPEN REPAIR RIGHT GROIN HERNIA WITH MESH;  Surgeon: Fanny Skates, MD;  Location: [facility];  Service: General;  Laterality: Right;  . INTRAVASCULAR LITHOTRIPSY  09/05/2019   Procedure: INTRAVASCULAR LITHOTRIPSY;  Surgeon: Adrian Prows, MD;  Location: [facility];  Service: Cardiovascular;;  left common femoral  . LAPAROSCOPIC CHOLECYSTECTOMY  June 2010  . LEFT HEART CATHETERIZATION WITH CORONARY ANGIOGRAM N/A 09/07/2012   Procedure: LEFT HEART CATHETERIZATION WITH CORONARY ANGIOGRAM;  Surgeon: Troy Sine, MD;  Location: [facility];  Service: Cardiovascular;  Laterality: N/A;  . LEFT HEART CATHETERIZATION WITH CORONARY/GRAFT ANGIOGRAM N/A 07/24/2014   Procedure: LEFT HEART CATHETERIZATION WITH Beatrix Fetters;  Surgeon: Troy Sine, MD; LAD 70%, 50% ISR, mLAD 99%, CFX 30%, patent stent, RCA patent, LIMA-LAD occluded, SVG-OM 1 patent, EF 50-55%  . LEXISCAN STRESS TEST  09/06/2012  . LOWER ARTERIAL DOPPLER  03/25/2012   FGT CIA 0-49%;  RGT CFA AND SFA AT STENT 0-49%; RGT DISTAL SFA = <50%; LFT. EIA  50-69%; LFT. CFA  0-49% ;;LFT. SFA  AT STENT NORMAL PATENCY  . LOWER EXTREMITY ANGIOGRAPHY N/A 09/05/2019   Procedure: LOWER EXTREMITY ANGIOGRAPHY;  Surgeon: Adrian Prows, MD;  Location: [facility];  Service: Cardiovascular;  Laterality: N/A;  . NM MYOVIEW LTD  05/06/2012   EF -STUDY NOT GATED, NORMAL STRESS NUCLEAR ,NORMAL LV FUNCTION  . PATCH ANGIOPLASTY Right 12/19/2015   Procedure: PATCH ANGIOPLASTY RIGHT FEMORAL ARTERY;  Surgeon: Serafina Mitchell, MD;  Location: [facility];  Service: Vascular;  Laterality: Right;  . PERCUTANEOUS CORONARY STENT INTERVENTION (PCI-S) Right 07/24/2014   Procedure: PERCUTANEOUS CORONARY STENT INTERVENTION (PCI-S);  Surgeon: Troy Sine, MD; ostial LAD PTCA with angioscope scoring balloon, 3.0 x 34 mm Resolute DES extending from the ostium to the mid LAD, covering all significant stenoses   . PERIPHERAL VASCULAR CATHETERIZATION N/A 09/26/2015   Procedure: Abdominal Aortogram w/Lower Extremity;  Surgeon: Lorretta Harp, MD;  Location: [facility];  Service: Cardiovascular;  Laterality: N/A;  . PERIPHERAL VASCULAR CATHETERIZATION Right 02/20/2016   Procedure: Peripheral Vascular Atherectomy;  Surgeon: Lorretta Harp, MD;  Location: [facility];  Service: Cardiovascular;  Laterality: Right;  SFA  . PERIPHERAL VASCULAR CATHETERIZATION Right 02/20/2016   Procedure: Peripheral Vascular Balloon Angioplasty;  Surgeon: Lorretta Harp, MD;  Location: [facility];  Service: Cardiovascular;  Laterality: Right;  SFA  . PERIPHERAL VASCULAR INTERVENTION  09/05/2019   Procedure: PERIPHERAL VASCULAR INTERVENTION;  Surgeon: Adrian Prows, MD;  Location: [facility];  Service: Cardiovascular;;  left common iliac  . TONSILLECTOMY  1950's  . UPPER GASTROINTESTINAL ENDOSCOPY       Current Outpatient Medications on File Prior to Visit  Medication Sig Dispense Refill  . acetaminophen (TYLENOL) 500 MG tablet Take 500-1,000 mg by mouth every 6 (six) hours as needed (for pain.).     [name] Ascorbic Acid (VITAMIN C) 1000 MG tablet Take 1,000 mg by mouth in the morning and at bedtime.     [name] aspirin EC 81 MG tablet Take 81 mg by mouth at bedtime.     [name] BYSTOLIC 5 MG tablet TAKE 1/2 TABLET ONCE DAILY 30 tablet 6  . cetirizine (ZYRTEC) 10 MG tablet Take 10 mg by mouth daily.     . Cholecalciferol (VITAMIN D3) 50 MCG (2000 UT) TABS Take 2,000 Units by mouth daily.    . cholestyramine light (PREVALITE) 4 g packet Take 4 g by mouth every evening.     . cyclobenzaprine (FLEXERIL) 10 MG tablet Take 10 mg by mouth 3 (three) times daily as needed for muscle spasms.     . finasteride (PROSCAR) 5 MG tablet Take 5 mg by mouth daily.    [name] ipratropium (ATROVENT) 0.03 % nasal spray Place 1 spray into both nostrils daily as needed for allergies.    [name] levothyroxine (SYNTHROID, LEVOTHROID) 25 MCG tablet Take 25 mcg by mouth daily before breakfast.     . loperamide (IMODIUM A-D) 2 MG tablet Take 2 mg by mouth 4 (four) times daily as needed for diarrhea or loose stools.    [name] losartan (COZAAR) 100 MG tablet Take 100 mg by mouth daily.    . Melatonin 12 MG TABS Take 12 mg by mouth at bedtime.     . Multiple Vitamin (MULTIVITAMIN WITH MINERALS) TABS tablet Take 1 tablet by mouth daily. One-A-Day for Men 50+    .  nitroGLYCERIN (NITROSTAT) 0.4 MG SL tablet PLACE 1 TABLET UNDER THE TONGUE, MAY REPEAT EVERY 5 MINUTES MAX 3 TABLETS 25 tablet 11  . pantoprazole (PROTONIX) 40 MG tablet Take 1 tablet (40 mg total) by mouth 2 (two) times daily. (Patient taking differently: Take 40 mg by mouth at bedtime. ) 60 tablet 0  . Probiotic Product (PROBIOTIC PO) Take 1 capsule by mouth daily.    . rivaroxaban (XARELTO) 20 MG TABS tablet Take 1 tablet (20 mg total) by mouth daily with supper. 90 tablet 3  . rosuvastatin (CRESTOR) 20 MG tablet TAKE ONE TABLET BY MOUTH EVERY DAY (Patient taking differently: Take 10 mg by mouth daily. ) 90 tablet 3  . Zinc 50 MG TABS Take 50 mg by mouth in the morning and at bedtime.    [name]  zolpidem (AMBIEN) 10 MG tablet Take 10 mg by mouth at bedtime as needed (sleep.).      No current facility-administered medications on file prior to visit.    Allergies  Allergen Reactions  . Other Anaphylaxis, Shortness Of Breath and Swelling    [place] nuts- "will kill me"  . Clarithromycin Nausea Only  . Fenofibrate Micronized Other (See Comments)    Caused weakness/lethargy  . Metoprolol Other (See Comments)    Fatigue and malaise  . Tricor [Fenofibrate] Other (See Comments)    Tired/weak  . Fish Oil Rash  . Levofloxacin Other (See Comments)    Doesn't remember  . Meloxicam Other (See Comments)    Achy, fatigue  . Metaxalone Other (See Comments)    Doesn't remember  . Niaspan [Niacin] Other (See Comments)    Doesn't remember  . Sulfadiazine Hives and Rash    Physical exam:  Today's Vitals   12/13/19 1254  BP: (!) 112/57  Pulse: 50  Weight: 187 lb (84.8 kg)  Height: 5\' 9"  (1.753 m)   Body mass index is 27.62 kg/m.   Wt Readings from Last 3 Encounters:  12/13/19 187 lb (84.8 kg)  12/12/19 187 lb 1.9 oz (84.9 kg)  11/29/19 189 lb (85.7 kg)     Ht Readings from Last 3 Encounters:  12/13/19 5\' 9"  (1.753 m)  12/12/19 5\' 8"  (1.727 m)  11/29/19 5\' 8"  (1.727 m)      General: The patient is awake, alert and appears not in acute distress. The patient is well groomed. Head: Normocephalic, atraumatic.  Neck is supple. Mallampati 3 ,  neck circumference:17 inches . Nasal airflow  patent.  Retrognathia is  seen. Double chin.  Dental status: deferred.  Cardiovascular:  Regular rate and cardiac rhythm by pulse,  without distended neck veins. Respiratory: Lungs are clear to auscultation.  Skin:  Without evidence of ankle edema, or rash. He has had DVTs.  Trunk: The patient's posture is erect.   Neurologic exam : The patient is awake and alert, oriented to place and time.   Memory subjective described as intact.  Attention span & concentration ability appears normal.    Speech is fluent,  without  dysarthria, dysphonia or aphasia.  Mood and affect are appropriate.   Cranial nerves: no loss of smell or taste reported  Pupils are equal and briskly reactive to light.  Funduscopic exam deferred. Bilateral ptosis. [name]  Extraocular movements in vertical and horizontal planes were intact and without nystagmus. No Diplopia. Visual fields by finger perimetry are intact. Hearing was intact to soft voice and finger rubbing.    Facial sensation intact to fine touch.  Facial motor strength  is symmetric and tongue and uvula move midline.  Neck ROM : rotation, tilt and flexion extension were normal for age and shoulder shrug was symmetrical.    Motor exam:  Symmetric bulk, tone and ROM.   Normal tone without cog wheeling, symmetric grip strength . Sensory:  Fine touch, pinprick and vibration were normal. He was very senstivive to vibration at the foot.  Proprioception tested in the upper extremities was normal. Coordination: Rapid alternating movements in the fingers/hands were of normal speed.  The Finger-to-nose maneuver was intact without evidence of ataxia, dysmetria or tremor. Gait and station: Patient could rise unassisted from a seated position, walked without assistive device.  Stance is of normal width/ base and the patient turned with 3 steps.  Toe and heel walk were deferred.  Deep tendon reflexes: in the  upper and lower extremities are symmetric and intact.  Babinski response was deferred .     After spending a total time of 45 minutes face to face and additional time for physical and neurologic examination, review of laboratory studies,  personal review of imaging studies, reports and results of other testing and review of referral information / records as far as provided in visit, I have established the following assessments:  [name] has a history of documented atrial fibrillation that happened only once he had no symptom of atrial fibrillation  since that happened right after an open heart surgery for coronary artery disease.  He had occasionally lower back pain, he was diagnosed with chronic bronchitis almost yearly has chronic obstructive pulmonary disease, has multiple stents for coronary and peripheral arterial disease.  GERD, history of myocardial infarction, history of pulmonary thromboembolism, history of deep venous blood clots.  His left ventricular ejection fraction was 55% in May 2012 before coronary artery bypass graft 2014.  Carotid Doppler studies were done in 2011 cardiac catheterization 2014 the most recent documentation is a peripheral vascular intervention to Dr. Ardeth Perfect for iliac stent.  There is also a strong family history of coronary artery disease for brother and father  The patient has variable blood pressures today's blood pressure here in the office is rather lowish and Dr. Kayren Eaves office his blood pressure was 156/62.  Pulse rate is 52 and here again lowers unrelated to beta-blockers which have been discontinued.  The patient had a metabolic blood test in August 31, 2019 glucose first borderline elevated BUN was 21 GFR was 58 calcium 9.3 potassium 5.0 and sodium 143.  White blood cells were 9.9K hemoglobin 14.5 hematocrit 2042.5 TSH last tested November 2020 was 2.4.  Hemoglobin A1c November 2020 was 6.0 which is elevated.  The good "" cholesterol HDL was 36 which is low but his total cholesterol was 131 which is not abnormal.  The newest tests were an echocardiogram from March 22 count and count duplex the same day ejection fraction again 50 to 55% less than 50% stenosis in the coronary vessels in the carotid vessels, Lexiscan nondiagnostic.  After Lexiscan.  Peripheral arteriogram from April of this year renal arteries shows an old left external iliac artery stent which has restenosed at 50% left common iliac artery has an 80% stenosis left common femoral artery 80 to I agree with Dr. Einar Gip that the patient's lethargy and  lack of energy can be related to sleep apnea but also to highly variable blood pressures I will evaluate the patient either need attended sleep study or in a home sleep test depending on his insurance coverage.  My Plan is to proceed with:  1) HST or attended sleep study- PAD, renal artery stenosis, CAD and single documentation of atrial fibrillation/.  2) prefer attended sleep study.  3) had blood tests yesterday.   I would like to thank Dr Einar Gip, MD and  and [name], Md [address] [place],  [place] [address] for allowing me to meet with and to take care of this pleasant patient.   In short, LENFORD BEDDOW is presenting with non restorative sleep.     Electronically signed by: Larey Seat, MD 12/13/2019 1:16 PM  [facility] and [place] certified by The [employer] of Sleep Medicine and Diplomate of the [employer] of Sleep Medicine. Board certified In Neurology through the [place], Fellow of the [employer] of Neurology. Medical Director of [employer].

## 2019-12-14 ENCOUNTER — Other Ambulatory Visit: Payer: Medicare Other

## 2019-12-14 ENCOUNTER — Ambulatory Visit: Payer: Medicare Other | Admitting: Hematology

## 2019-12-26 ENCOUNTER — Ambulatory Visit (INDEPENDENT_AMBULATORY_CARE_PROVIDER_SITE_OTHER): Payer: Medicare Other | Admitting: Neurology

## 2019-12-26 DIAGNOSIS — I2510 Atherosclerotic heart disease of native coronary artery without angina pectoris: Secondary | ICD-10-CM

## 2019-12-26 DIAGNOSIS — I480 Paroxysmal atrial fibrillation: Secondary | ICD-10-CM

## 2019-12-26 DIAGNOSIS — D689 Coagulation defect, unspecified: Secondary | ICD-10-CM

## 2019-12-26 DIAGNOSIS — G253 Myoclonus: Secondary | ICD-10-CM

## 2019-12-26 DIAGNOSIS — R0683 Snoring: Secondary | ICD-10-CM

## 2019-12-26 DIAGNOSIS — R0902 Hypoxemia: Secondary | ICD-10-CM

## 2019-12-26 DIAGNOSIS — J4489 Other specified chronic obstructive pulmonary disease: Secondary | ICD-10-CM

## 2019-12-26 DIAGNOSIS — G478 Other sleep disorders: Secondary | ICD-10-CM

## 2019-12-26 DIAGNOSIS — J449 Chronic obstructive pulmonary disease, unspecified: Secondary | ICD-10-CM

## 2019-12-26 DIAGNOSIS — I739 Peripheral vascular disease, unspecified: Secondary | ICD-10-CM

## 2020-01-05 DIAGNOSIS — G253 Myoclonus: Secondary | ICD-10-CM | POA: Insufficient documentation

## 2020-01-05 DIAGNOSIS — I739 Peripheral vascular disease, unspecified: Secondary | ICD-10-CM | POA: Insufficient documentation

## 2020-01-05 NOTE — Procedures (Signed)
PATIENT'S NAME:  Brian Brock, Brian Brock DOB:      1945-12-28      MR#:    [id number]     DATE OF RECORDING: 12/26/2019 [facility] REFERRING M.D.:  Kela Millin, MD Study Performed:   Baseline Polysomnogram HISTORY:  Brian Brock is a 74 year- old Caucasian male patient and seen here upon referral on 12/13/2019   for a new sleep evaluation. .  Chief concern : "[name] reports that about 8 months ago he began feeling unrefreshed, unrestored when waking form sleep- he remained fatigued and exhausted all the time.  A first sleep evaluation in 2006 had revealed such mild apnea that CPAP was not needed. He still feels that he averages enough sleep at night but that the sleep does not have the same restorative quality.   Brian Brock has a past medical history of AF (atrial fibrillation) ([place]), Allergic rhinitis, Allergy, Anginal pain ([place]) (1997), Arthritis, Childhood asthma, Chronic bronchitis ([place]), COPD (chronic obstructive pulmonary disease) ([place]), Coronary artery disease, Dyslipidemia, Dysrhythmia, Femoral hernia of right side (12/28/2016), GERD (gastroesophageal reflux disease), History of hiatal hernia, Hyperlipemia, Hypertension, Hypothyroidism, Irritable bowel syndrome, Myocardial infarct ([place]) (1997), PE (pulmonary thromboembolism) ([place]) (05/2018), Peripheral arterial-vascular disease ([place]), and hip pain- until fem pop stents were placed. [name] He reported metoprolol caused him to be excessively fatigued and he had felt better once it was discontinued. Now he is again feeling fatigued.  He has chronic insomnia, goes to bed as late as 2 AM and never feels refreshed.   The patient had the first sleep study in the year 2006 at [facility] with Dr. Maxwell Caul. The result was an AHI (Apnea Hypopnea Index) of only 4.1/h. strongly supine dependent.   Sleep relevant medical history: Nocturia 2-3 times, Sleep walking, Tonsillectomy. Chronically on Ambien.    Social history:  Patient is retired from Research scientist (life sciences) and lives in a household with spouse- The patient used to work in shifts (Presenter, broadcasting). Active tobacco use at 1/2 ppd.  ETOH socially, Caffeine intake in form of Tea 3-5 glasses daily. The patient goes to bed at 12 midnight and watches TV in bed, always takes Ambien, every night for years -and is reportedly asleep by 2 AM and continues to sleep for 5 hours, wakes for bathroom breaks, the first time at 2-and again at 7.30 AM.   Naps are taken infrequently, spontaneously, lasting from 10 to 30 minutes, and are more refreshing than nocturnal sleep. The patient endorsed the Epworth Sleepiness Scale at 10 points.   The patient's weight 187 pounds with a height of 69 (inches), resulting in a BMI of 27.8 kg/m2. The patient's neck circumference measured 17 inches.  CURRENT MEDICATIONS: Tylenol, Aspirin, Bystolic, Zyrtec, Vit D3, Prevalite, Flexeril, Finasteride, Ipratropium, Levothyroxine, Loperamide, Losartan, Melatonin, Nitroglycerin, Protonix, Probiotic, Xarelto, Crestor, Zinc, Ambien   PROCEDURE:  This is a multichannel digital polysomnogram utilizing the Somnostar 11.2 system.  Electrodes and sensors were applied and monitored per AASM Specifications.   EEG, EOG, Chin and Limb EMG, were sampled at 200 Hz.  ECG, Snore and Nasal Pressure, Thermal Airflow, Respiratory Effort, CPAP Flow and Pressure, Oximetry was sampled at 50 Hz. Digital video and audio were recorded.      BASELINE STUDY: Lights Out was at 22:06 and Lights On at 04:35.  Total recording time (TRT) was 387.5 minutes, with a total sleep time (TST) of 287.5 minutes.   The patient's sleep latency was 18 minutes.  REM latency was 72  minutes.  The sleep efficiency was 74.2 %.     SLEEP ARCHITECTURE: WASO (Wake after sleep onset) was 72.5 minutes.  There were 23.5 minutes in Stage N1, 224.5 minutes Stage N2, 0 minutes Stage N3 and 39.5 minutes in Stage REM.  The percentage of Stage N1 was 8.2%, Stage N2 was 78.1%, Stage N3 was 0% and  Stage R (REM sleep) was 13.7%.   RESPIRATORY ANALYSIS:  There were a total of 14 respiratory events:  0 obstructive apneas, 4 central apneas and 0 mixed apneas with 10 hypopneas. The patient also had 2 respiratory event related arousals (RERAs).   The total APNEA/HYPOPNEA INDEX (AHI) was 2.9/hour and the total RESPIRATORY DISTURBANCE INDEX was  3.1 /hour.  5 events occurred in REM sleep and 14 events in NREM. The REM AHI was 7.6 /hour, versus a non-REM AHI of 2.2. The patient spent 16 minutes of total sleep time in the supine position and 272 minutes in non-supine. The supine AHI was 0.0 versus a non-supine AHI of 3.1.  OXYGEN SATURATION & C02:  The Wake baseline 02 saturation was 92%, with the lowest being 67%. Time spent below 89% saturation equaled 45 minutes. The arousals were noted as: 63 were spontaneous, 1 associated with PLMs, and only 5 were associated with respiratory events. The Periodic Limb Movement (PLM) index was 17.5 and the PLM Arousal index was 0.2/hour. Audio and video analysis did not show any abnormal or unusual movements, behaviors, phonations or vocalizations.  Snoring was noted. EKG with intermittent irregular beats, extrasystoles.   IMPRESSION: 1. No clinically significant degree of Sleep Apnea. Mostly central apnea among few recorded events.  2. Sleep hypoxemia- 02 nadir was 67% and total desaturation time 45 minutes- in a tobacco user with CAD and PAD, this is significant.  3. Clinically insignificant Periodic Limb Movement Disorder (PLMD) not causing arousals. When in wakeful state, the patient did have much more frequent limb movements, these were non-periodic and rather myoclonic.  Non-specific abnormal EKG (I attached screen shots).   RECOMMENDATIONS: 1. There is no need for CPAP. The main identified problem is hypoxemia during REM sleep, and the over all AHI doesn't allow for positive airway pressure therapy.  2. I cannot order oxygen without PAP therapy, that can only  be done by pulmonology or cardiology.   3. The patient wakes frequently with a myoclonic jerk, not a periodic limb movement.  This can be related to PAD. It can be seen as a response to hypoxia, too. Tobacco cessation is urgently needed.    I certify that I have reviewed the entire raw data recording prior to the issuance of this report in accordance with the Standards of Accreditation of the American Academy of Sleep Medicine (AASM)    Larey Seat, MD Diplomat, American Board of Psychiatry and Neurology  Diplomat, American Board of Sleep Medicine Market researcher, [facility] [employer]

## 2020-01-05 NOTE — Progress Notes (Signed)
IMPRESSION: 1. No clinically significant degree of Sleep Apnea. Mostly central apnea among few recorded events.  2. Sleep hypoxemia- 02 nadir was 67% and total desaturation time 45 minutes- in a tobacco user with CAD and PAD, this is significant.  3. Clinically insignificant Periodic Limb Movement Disorder (PLMD) not causing arousals. When in wakeful state, the patient did have much more frequent limb movements, these were non-periodic and rather myoclonic.  Non-specific abnormal EKG (I attached screen shots).   RECOMMENDATIONS: 1. There is no need for CPAP. The main identified problem is hypoxemia during REM sleep, and the over all AHI doesn't allow for positive airway pressure therapy.  2. I cannot order oxygen without PAP therapy, that can only be done by pulmonology or cardiology.   3. The patient wakes frequently with a myoclonic jerk, not a periodic limb movement.  This can be related to PAD. It can be seen as a response to hypoxia, too. Tobacco cessation is urgently needed.

## 2020-01-07 ENCOUNTER — Telehealth: Payer: Self-pay | Admitting: Cardiology

## 2020-01-07 DIAGNOSIS — G4734 Idiopathic sleep related nonobstructive alveolar hypoventilation: Secondary | ICD-10-CM

## 2020-01-07 DIAGNOSIS — I2510 Atherosclerotic heart disease of native coronary artery without angina pectoris: Secondary | ICD-10-CM

## 2020-01-07 DIAGNOSIS — I739 Peripheral vascular disease, unspecified: Secondary | ICD-10-CM

## 2020-01-07 NOTE — Telephone Encounter (Signed)
ICD-10-CM   1. Nocturnal hypoxemia  G47.34 For home use only DME oxygen  2. Coronary artery disease involving native coronary artery of native heart without angina pectoris  I25.10 For home use only DME oxygen  3. Claudication in peripheral vascular disease ([place])  I73.9 For home use only DME oxygen      Adrian Prows, MD, [facility] 01/07/2020, 10:41 AM Office: [phone]

## 2020-01-09 ENCOUNTER — Telehealth: Payer: Self-pay | Admitting: Neurology

## 2020-01-09 NOTE — Telephone Encounter (Signed)
-----   Message from Larey Seat, MD sent at 01/05/2020  5:05 PM EDT ----- IMPRESSION: 1. No clinically significant degree of Sleep Apnea. Mostly central apnea among few recorded events.  2. Sleep hypoxemia- 02 nadir was 67% and total desaturation time 45 minutes- in a tobacco user with CAD and PAD, this is significant.  3. Clinically insignificant Periodic Limb Movement Disorder (PLMD) not causing arousals. When in wakeful state, the patient did have much more frequent limb movements, these were non-periodic and rather myoclonic.  Non-specific abnormal EKG (I attached screen shots).   RECOMMENDATIONS: 1. There is no need for CPAP. The main identified problem is hypoxemia during REM sleep, and the over all AHI doesn't allow for positive airway pressure therapy.  2. I cannot order oxygen without PAP therapy, that can only be done by pulmonology or cardiology.   3. The patient wakes frequently with a myoclonic jerk, not a periodic limb movement.  This can be related to PAD. It can be seen as a response to hypoxia, too. Tobacco cessation is urgently needed.

## 2020-01-09 NOTE — Telephone Encounter (Signed)
Called the patient and reviewed his sleep study results. Advised the details of the study. Informed the patient that most concerning thing was his oxygen level that was dropping during the study. There was some non-specific EKG strips that were attached to the sleep technical data. Advised the patient I would make sure the cardiologist was aware of where to review that information. Informed the patient the cardiologist may be able to assist with offering oxygen for the patient to sleep with over night. Pt verbalized understanding. Pt had no questions at this time but was encouraged to call back if questions arise.

## 2020-01-23 ENCOUNTER — Telehealth: Payer: Self-pay

## 2020-01-23 DIAGNOSIS — Z7189 Other specified counseling: Secondary | ICD-10-CM

## 2020-01-23 DIAGNOSIS — U071 COVID-19: Secondary | ICD-10-CM

## 2020-01-23 DIAGNOSIS — G4734 Idiopathic sleep related nonobstructive alveolar hypoventilation: Secondary | ICD-10-CM

## 2020-01-23 NOTE — Telephone Encounter (Signed)
Patient was diagnosed with COVID-19 infection on 01/17/2020.  He still has significant congestion in his nose, he just finished his erythromycin.  Advised him to contact his PCP regarding possibility of IgG infusion if he is appropriate candidate as there is time limit on this.  I also discussed with him that he needs to get himself vaccinated, he was not vaccinated.  With regard to nocturnal hypoxemia, this is chronic, he has oxygen supply ordered and I can follow-up with the DME.  There is no urgency in oxygen for him as he is not hypoxemic during daytime and this is unrelated to Covid infection.  He will get in touch with his PCP for further management.  Happy to answer any other questions.  This was a 10-minute telephone encounter with 3 minutes of additional time for documentation and review.   Adrian Prows, MD, [facility] 01/23/2020, 11:00 AM Office: [phone]

## 2020-01-23 NOTE — Telephone Encounter (Signed)
This is my note miss. JG

## 2020-01-26 NOTE — Progress Notes (Signed)
Your procedure is scheduled on Thursday September 16.  Report to [facility] Main Entrance "A" at 07:00 A.M., and check in at the Admitting office.  Call this number if you have problems the morning of surgery: [phone]  Call [phone] if you have any questions prior to your surgery date Monday-Friday 8am-4pm   Remember: Do not eat after midnight the night before your surgery  You may drink clear liquids until 06:00 A.M. the morning of your surgery.   Clear liquids allowed are: Water, Non-Citrus Juices (without pulp), Carbonated Beverages, Clear Tea, Black Coffee Only, and Gatorade  Please complete your PRE-SURGERY ENSURE that was provided to you by 06:00 A.M. the morning of your surgery.  Please, if able, drink it in one setting. DO NOT SIP.    Take these medicines the morning of surgery with A SIP OF WATER: azithromycin (ZITHROMAX)  BYSTOLIC  cetirizine (ZYRTEC)  levothyroxine (SYNTHROID, LEVOTHROID) rosuvastatin (CRESTOR)  IF NEEDED: acetaminophen (TYLENOL)  cyclobenzaprine (FLEXERIL)  nitroGLYCERIN (NITROSTAT)   Follow your surgeon's instructions on when to stop Aspirin and rivaroxaban (XARELTO).  If no instructions were given by your surgeon then you will need to call the office to get those instructions.     As of today, STOP taking any Aspirin (unless otherwise instructed by your surgeon), Aleve, Naproxen, Ibuprofen, Motrin, Advil, Goody's, BC's, all herbal medications, fish oil, and all vitamins.    The Morning of Surgery  Do not wear jewelry  Do not wear lotions, powders, colognes, or deodorant   Men may shave face and neck.  Do not bring valuables to the hospital.  [facility] is not responsible for any belongings or valuables.  If you are a smoker, DO NOT Smoke 24 hours prior to surgery  If you wear a CPAP at night please bring your mask the morning of surgery   Remember that you must have someone to transport you home after your surgery, and remain  with you for 24 hours if you are discharged the same day.   Please bring cases for contacts, glasses, hearing aids, dentures or bridgework because it cannot be worn into surgery.    Leave your suitcase in the car.  After surgery it may be brought to your room.  For patients admitted to the hospital, discharge time will be determined by your treatment team.  Patients discharged the day of surgery will not be allowed to drive home.    Special instructions:   [place]- Preparing For Surgery  Before surgery, you can play an important role. Because skin is not sterile, your skin needs to be as free of germs as possible. You can reduce the number of germs on your skin by washing with CHG (chlorahexidine gluconate) Soap before surgery.  CHG is an antiseptic cleaner which kills germs and bonds with the skin to continue killing germs even after washing.    Oral Hygiene is also important to reduce your risk of infection.  Remember - BRUSH YOUR TEETH THE MORNING OF SURGERY WITH YOUR REGULAR TOOTHPASTE  Please do not use if you have an allergy to CHG or antibacterial soaps. If your skin becomes reddened/irritated stop using the CHG.  Do not shave (including legs and underarms) for at least 48 hours prior to first CHG shower. It is OK to shave your face.  Please follow these instructions carefully.   1. Shower the NIGHT BEFORE SURGERY and the MORNING OF SURGERY with CHG Soap.   2. If you chose to wash  your hair and body, wash as usual with your normal shampoo and body-wash/soap.  3. Rinse your hair and body thoroughly to remove the shampoo and soap.  4. Apply CHG directly to the skin (ONLY FROM THE NECK DOWN) and wash gently with a scrungie or a clean washcloth.   5. Do not use on open wounds or open sores. Avoid contact with your eyes, ears, mouth and genitals (private parts). Wash Face and genitals (private parts)  with your normal soap.   6. Wash thoroughly, paying special attention to  the area where your surgery will be performed.  7. Thoroughly rinse your body with warm water from the neck down.  8. DO NOT shower/wash with your normal soap after using and rinsing off the CHG Soap.  9. Pat yourself dry with a CLEAN TOWEL.  10. Wear CLEAN PAJAMAS to bed the night before surgery  11. Place CLEAN SHEETS on your bed the night of your first shower and DO NOT SLEEP WITH PETS.  12. Wear comfortable clothes the morning of surgery.     Day of Surgery:  Please shower the morning of surgery with the CHG soap Do not apply any deodorants/lotions. Please wear clean clothes to the hospital/surgery center.   Remember to brush your teeth WITH YOUR REGULAR TOOTHPASTE.   Please read over the following fact sheets that you were given.

## 2020-01-29 ENCOUNTER — Other Ambulatory Visit (HOSPITAL_COMMUNITY): Payer: Medicare Other

## 2020-01-29 ENCOUNTER — Inpatient Hospital Stay (HOSPITAL_COMMUNITY)
Admission: RE | Admit: 2020-01-29 | Discharge: 2020-01-29 | Disposition: A | Discharge status: Home / Self Care | Payer: Medicare Other | Source: Ambulatory Visit

## 2020-01-30 MED ORDER — BUPIVACAINE HCL (PF) 0.25 % IJ SOLN
INTRAMUSCULAR | Status: AC
  Filled 2020-01-30: qty 30

## 2020-02-05 ENCOUNTER — Ambulatory Visit: Payer: Medicare Other | Admitting: Cardiology

## 2020-02-05 NOTE — Progress Notes (Signed)
Primary Physician/Referring:  [name], MD  Patient ID: Brian Brock, male    DOB: [birth year], 74 y.o.   MRN: [id number]  Chief Complaint  Patient presents with  . Follow-up    6 month  . Coronary Artery Disease   HPI:    Brian Brock  is a 74 y.o. Caucasian male with CAD with multiple angioplasties over the years, due to restenosis underwent CABG on 09/12/2012 but had recurrence of angina and LIMA occluded needing LAD stenting in 2016.  Past medical history significant for hypertension, hyperglycemia, hyperlipidemia, hypothyroidism, peripheral vascular disease with bilateral SFA stenting and angioplasty, right femoral endarterectomy and left external iliac artery stent in past, repeat stenting of the left common iliac artery and lithotripsy followed by drug-coated balloon angioplasty of the left common femoral artery on 09/05/2019, spontaneous DVT and pulmonary embolism in January 2020 and has been on Xarelto since then.  He has chronic dyspnea on exertion and COPD with ongoing tobacco use.  Presents for 34-month follow-up for coronary artery disease and peripheral arterial disease.  Of note patient was diagnosed with Covid 19 last month, he has recuperated well.  He denies claudication symptoms.  Denies chest pain, palpitations, dizziness, syncope, orthopnea.  Does report chronic dyspnea on exertion, however this has become slightly more frequent since his COVID-19 infection last month.  He does report his dyspnea is improving, but is not yet back to baseline for him. He also reports mild fatigue since his Covid 19 infection, which is also improving.   At last visit nocturnal oximetry was ordered, however this was not done.  He did have a sleep study done in the interim.  He reports his blood pressures have been well controlled at home.  Patient does continue to smoke about 10 cigarettes daily.  Past Medical History:  Diagnosis Date  . AF (atrial fibrillation) ([place])     happened after CABG, has not had it since  . Allergic rhinitis   . Allergy   . Anginal pain ([place]) 1997  . Arthritis    "lower back" (09/26/2015)  . Childhood asthma   . Chronic bronchitis ([place])    Yearly  . COPD (chronic obstructive pulmonary disease) ([place])   . Coronary artery disease    Multiple stents  . Dyslipidemia   . Dysrhythmia   . Femoral hernia of right side 12/28/2016  . GERD (gastroesophageal reflux disease)   . History of hiatal hernia   . Hyperlipemia   . Hypertension   . Hypothyroidism   . Irritable bowel syndrome   . Myocardial infarct ([place]) 1997   "mild"  . PE (pulmonary thromboembolism) ([place]) 05/2018  . Peripheral vascular disease ([place])    prior stenting  . Restless legs    Past Surgical History:  Procedure Laterality Date  . CARDIAC CATHETERIZATION  2004   had remote tandem stents in 1997 and 1998 for LAD lesions, RCA stent in 1997 , midprto=ion, and a proximal RCA  SENT IN 1999, all DES; some were IVIS  guide.  [name] CARDIAC CATHETERIZATION  09/07/2012   demonstrated severe 2- vessel disease,patent LAD,patient circ and patent right stents, but 90% ostial LAD whic was new.  . CAROTID DOPPLER  100/24/11   RGT BULB and PROXIMAL ICA 0-49%; LFT. ICA  0-49 ; RGT SUBCLAVIAN ARTERY < 50% ; LFT SUBCLAVIAN normal patency; RGT  and LFT  VERTEBRAL ARTERIES  . COLONOSCOPY  11/06/2016   Diverticulosis of colon. Internal hemorrhoids.   . CORONARY ANGIOPLASTY    .  CORONARY ANGIOPLASTY WITH STENT PLACEMENT  '97, '98, '04, '06, '08, '10   seven cardiac stents  . CORONARY ARTERY BYPASS GRAFT N/A 09/12/2012   Procedure: OFF PUMP CORONARY ARTERY BYPASS GRAFTING (CABG);  Surgeon: Melrose Nakayama, MD;  Location: [place];  Service: Open Heart Surgery;  Laterality: N/A;  Times two using left internal mammary artery and endoscopically harvested right saphenous vein  ;LIMA-LAD and SVG -OM1  . DOPPLER ECHOCARDIOGRAPHY  11/23/2012,09/30/2010    LV EF 50-55%----09/30/2010-LV EF = >55%  .  ENDARTERECTOMY FEMORAL Right 12/19/2015   Procedure: ENDARTERECTOMY RIGHT FEMORAL ARTERY;  Surgeon: Serafina Mitchell, MD;  Location: [facility];  Service: Vascular;  Laterality: Right;  . event monitor  11/04/2012-11/17/2012   pt denies this hx on 07/24/2014  . FEMORAL ARTERY STENT Bilateral 7/06  8/08   Lt SFA '06, Rt SFA 8/08  . HERNIA REPAIR  10/2008   "repaired w/gallbladder OR"  . HERNIA REPAIR  07/2016  . INGUINAL HERNIA REPAIR Right 12/28/2016   Procedure: OPEN REPAIR RIGHT GROIN HERNIA WITH MESH;  Surgeon: Fanny Skates, MD;  Location: [place];  Service: General;  Laterality: Right;  . INTRAVASCULAR LITHOTRIPSY  09/05/2019   Procedure: INTRAVASCULAR LITHOTRIPSY;  Surgeon: Adrian Prows, MD;  Location: [facility];  Service: Cardiovascular;;  left common femoral  . LAPAROSCOPIC CHOLECYSTECTOMY  June 2010  . LEFT HEART CATHETERIZATION WITH CORONARY ANGIOGRAM N/A 09/07/2012   Procedure: LEFT HEART CATHETERIZATION WITH CORONARY ANGIOGRAM;  Surgeon: Troy Sine, MD;  Location: [facility] CATH [facility];  Service: Cardiovascular;  Laterality: N/A;  . LEFT HEART CATHETERIZATION WITH CORONARY/GRAFT ANGIOGRAM N/A 07/24/2014   Procedure: LEFT HEART CATHETERIZATION WITH Beatrix Fetters;  Surgeon: Troy Sine, MD; LAD 70%, 50% ISR, mLAD 99%, CFX 30%, patent stent, RCA patent, LIMA-LAD occluded, SVG-OM 1 patent, EF 50-55%  . LEXISCAN STRESS TEST  09/06/2012  . LOWER ARTERIAL DOPPLER  03/25/2012   FGT CIA 0-49%; RGT CFA AND SFA AT STENT 0-49%; RGT DISTAL SFA = <50%; LFT. EIA  50-69%; LFT. CFA 0-49% ;;LFT. SFA  AT STENT NORMAL PATENCY  . LOWER EXTREMITY ANGIOGRAPHY N/A 09/05/2019   Procedure: LOWER EXTREMITY ANGIOGRAPHY;  Surgeon: Adrian Prows, MD;  Location: [place] [facility];  Service: Cardiovascular;  Laterality: N/A;  . NM MYOVIEW LTD  05/06/2012   EF -STUDY NOT GATED, NORMAL STRESS NUCLEAR ,NORMAL LV FUNCTION  . PATCH ANGIOPLASTY Right 12/19/2015   Procedure: PATCH ANGIOPLASTY RIGHT FEMORAL ARTERY;  Surgeon:  Serafina Mitchell, MD;  Location: [place];  Service: Vascular;  Laterality: Right;  . PERCUTANEOUS CORONARY STENT INTERVENTION (PCI-S) Right 07/24/2014   Procedure: PERCUTANEOUS CORONARY STENT INTERVENTION (PCI-S);  Surgeon: Troy Sine, MD; ostial LAD PTCA with angioscope scoring balloon, 3.0 x 34 mm Resolute DES extending from the ostium to the mid LAD, covering all significant stenoses   . PERIPHERAL VASCULAR CATHETERIZATION N/A 09/26/2015   Procedure: Abdominal Aortogram w/Lower Extremity;  Surgeon: Lorretta Harp, MD;  Location: [place] [facility];  Service: Cardiovascular;  Laterality: N/A;  . PERIPHERAL VASCULAR CATHETERIZATION Right 02/20/2016   Procedure: Peripheral Vascular Atherectomy;  Surgeon: Lorretta Harp, MD;  Location: [place] [facility];  Service: Cardiovascular;  Laterality: Right;  SFA  . PERIPHERAL VASCULAR CATHETERIZATION Right 02/20/2016   Procedure: Peripheral Vascular Balloon Angioplasty;  Surgeon: Lorretta Harp, MD;  Location: [place] [facility];  Service: Cardiovascular;  Laterality: Right;  SFA  . PERIPHERAL VASCULAR INTERVENTION  09/05/2019   Procedure: PERIPHERAL VASCULAR INTERVENTION;  Surgeon: Adrian Prows, MD;  Location: [place] [facility];  Service: Cardiovascular;;  left common iliac  . TONSILLECTOMY  1950's  . UPPER GASTROINTESTINAL ENDOSCOPY     Family History  Problem Relation Age of Onset  . Coronary artery disease Father 10  . Heart attack Father   . Coronary artery disease Brother 67       CABG in 2000  . Deep vein thrombosis Brother   . Colon cancer Neg Hx   . Esophageal cancer Neg Hx   . Rectal cancer Neg Hx   . Stomach cancer Neg Hx     Social History   Tobacco Use  . Smoking status: Current Some Day Smoker    Packs/day: 0.50    Years: 56.00    Pack years: 28.00    Types: Cigarettes    Last attempt to quit: 03/25/2016    Years since quitting: 3.8  . Smokeless tobacco: Current User    Types: Chew  Substance Use Topics  . Alcohol use: Yes     Alcohol/week: 1.0 standard drink    Types: 1 Standard drinks or equivalent per week    Comment: 09/26/2015 "varies; might have a few drinks q 2 months"   Marital status: Married   ROS   Review of Systems  Constitutional: Positive for malaise/fatigue.  Cardiovascular: Positive for dyspnea on exertion (Chronic). Negative for chest pain, claudication, leg swelling, orthopnea, palpitations and syncope.  Respiratory: Negative for shortness of breath.   Hematologic/Lymphatic: Does not bruise/bleed easily.  Musculoskeletal: Positive for arthritis and back pain.  Gastrointestinal: Negative for melena.   Objective  Blood pressure 130/62, pulse (!) 55, resp. rate 15, height 5\' 9"  (1.753 m), weight 188 lb (85.3 kg), SpO2 96 %.  Vitals with BMI 02/06/2020 12/13/2019 12/12/2019  Height 5\' 9"  5\' 9"  5\' 8"   Weight 188 lbs 187 lbs 187 lbs 2 oz  BMI 27.75 25.4 98.26  Systolic 415 830 940  Diastolic 62 57 54  Pulse 55 50 50     Physical Exam Constitutional:      Appearance: He is well-developed.  Cardiovascular:     Rate and Rhythm: Regular rhythm. Bradycardia present.     Pulses:          Carotid pulses are 2+ on the right side with bruit and 2+ on the left side.      Dorsalis pedis pulses are 2+ on the right side and 1+ on the left side.       Posterior tibial pulses are 0 on the right side and 1+ on the left side.     Heart sounds: Normal heart sounds.     Comments: No JVD. Pulmonary:     Breath sounds: Rales (bialteral left base worse than right. Diffuse scattered) present.    Laboratory examination:   Recent Labs    06/15/19 1052 08/31/19 1436 12/12/19 1034  NA 143 143 140  K 3.9 5.0 3.6  CL 108 106 108  CO2 29 24 25   GLUCOSE 168* 100* 135*  BUN 15 21 22   CREATININE 1.06 1.22 1.07  CALCIUM 8.9 9.3 9.2  GFRNONAA >60 58* >60  GFRAA >60 68 >60   CrCl cannot be calculated (Patient's most recent lab result is older than the maximum 21 days allowed.).  CMP Latest Ref Rng &  Units 12/12/2019 08/31/2019 06/15/2019  Glucose 70 - 99 mg/dL 135(H) 100(H) 168(H)  BUN 8 - 23 mg/dL 22 21 15   Creatinine 0.61 - 1.24 mg/dL 1.07  1.22 1.06  Sodium 135 - 145 mmol/L 140 143 143  Potassium 3.5 - 5.1 mmol/L 3.6 5.0 3.9  Chloride 98 - 111 mmol/L 108 106 108  CO2 22 - 32 mmol/L 25 24 29   Calcium 8.9 - 10.3 mg/dL 9.2 9.3 8.9  Total Protein 6.5 - 8.1 g/dL 6.3(L) - 6.3(L)  Total Bilirubin 0.3 - 1.2 mg/dL 0.5 - 0.6  Alkaline Phos 38 - 126 U/L 85 - 75  AST 15 - 41 U/L 17 - 21  ALT 0 - 44 U/L 14 - 21   CBC Latest Ref Rng & Units 12/12/2019 08/31/2019 06/15/2019  WBC 4.0 - 10.5 K/uL 9.1 9.9 8.0  Hemoglobin 13.0 - 17.0 g/dL 14.0 14.5 14.5  Hematocrit 39 - 52 % 41.1 42.5 44.0  Platelets 150 - 400 K/uL 196 217 230   Lipid Panel     Component Value Date/Time   CHOL 131 04/06/2019 1002   TRIG 148 04/06/2019 1002   HDL 36 (L) 04/06/2019 1002   CHOLHDL 2.9 02/07/2018 0915   CHOLHDL 3.0 09/06/2012 0455   VLDL 36 09/06/2012 0455   LDLCALC 69 04/06/2019 1002   HEMOGLOBIN A1C Lab Results  Component Value Date   HGBA1C 6.0 (H) 04/06/2019   MPG 140 (H) 09/05/2012   TSH Recent Labs    04/06/19 1002  TSH 2.430   External Labs:   Medications and allergies   Allergies  Allergen Reactions  . Other Anaphylaxis, Shortness Of Breath and Swelling    [place] nuts- "will kill me"  . Clarithromycin Nausea Only  . Fenofibrate Micronized Other (See Comments)    Caused weakness/lethargy  . Metoprolol Other (See Comments)    Fatigue and malaise  . Tricor [Fenofibrate] Other (See Comments)    Tired/weak  . Fish Oil Rash  . Levofloxacin Other (See Comments)    Doesn't remember  . Meloxicam Other (See Comments)    Achy, fatigue  . Metaxalone Other (See Comments)    Doesn't remember  . Niaspan [Niacin] Other (See Comments)    Doesn't remember  . Sulfadiazine Hives and Rash     Current Outpatient Medications  Medication Instructions  . acetaminophen (TYLENOL) 500-1,000 mg, Oral,  Every 6 hours PRN  . aspirin EC 81 mg, Oral, Daily at bedtime  . azithromycin (ZITHROMAX) 500 mg, Daily  . BYSTOLIC 5 MG tablet TAKE 1/2 TABLET ONCE DAILY  . cetirizine (ZYRTEC) 10 mg, Oral, Daily  . cholestyramine light (PREVALITE) 4 g, Oral, Every evening  . cyclobenzaprine (FLEXERIL) 10 mg, Oral, 3 times daily PRN  . finasteride (PROSCAR) 5 mg, Daily  . ipratropium (ATROVENT) 0.03 % nasal spray 1 spray, Each Nare, Daily PRN  . levothyroxine (SYNTHROID) 25 mcg, Oral, Daily before breakfast  . loperamide (IMODIUM A-D) 2 mg, Oral, 4 times daily PRN  . losartan (COZAAR) 100 mg, Oral, Daily  . Melatonin 12 mg, Oral, Daily at bedtime  . Multiple Vitamin (MULTIVITAMIN WITH MINERALS) TABS tablet 1 tablet, Oral, Daily, One-A-Day for Men 50+  . nitroGLYCERIN (NITROSTAT) 0.4 MG SL tablet PLACE 1 TABLET UNDER THE TONGUE, MAY REPEAT EVERY 5 MINUTES MAX 3 TABLETS  . pantoprazole (PROTONIX) 40 mg, Oral, 2 times daily  . predniSONE (DELTASONE) 20 mg, Daily with breakfast  . Probiotic Product (PROBIOTIC PO) 1 capsule, Oral, Daily  . rivaroxaban (XARELTO) 20 mg, Oral, Daily with supper  . rosuvastatin (CRESTOR) 20 MG tablet TAKE ONE TABLET BY MOUTH EVERY DAY  . vitamin C 1,000 mg, Oral, 2 times daily  .  Vitamin D3 2,000 Units, Oral, Daily  . Zinc 50 mg, Oral, 2 times daily  . zolpidem (AMBIEN) 10 mg, Oral, At bedtime PRN   Radiology:   No results found.  Cardiac Studies:   Coronary angiogram and stenting to his LAD and RCA  in 1997, 1998, and in 2004 to his proximal left circumflex coronary artery.  Due to recurrent restenosis underwent CABG on 09/12/2012 with off-pump LIMA to LAD and SVG to OM1.  CABG  by Dr. Roxan Hockey on 09/12/2012 with an off-pump LIMA to LAD (Occluded by angoigram in 2016) and SVG to circumflex marginal 1 vessel.  Coronary angiogram 07/24/2014: High-grade stenosis to the proximal LAD, patent circumflex stent, patent SVG to OM1 however LIMA to LAD occluded.  Successful  stenting to proximal LAD with 3.0 x 34 mm resolute DES, postdilated to 3.4 mm.  Peripheral arteriogram: Stenting to his left SFA in 2004 and right SFA in 2008.  Stenting of left external iliac artery, right common femoral artery endarterectomy and patch angioplasty in August 2017 with staged right SFA atherectomy.  Abdominal aortic and iliac duplex 11/03/2018: Stenosis: +--------------------+-------------+---------------+  Location      Stenosis   Stent       +--------------------+-------------+---------------+  Right Common Iliac <50% stenosis          +--------------------+-------------+---------------+  Left Common Iliac  <50% stenosis          +--------------------+-------------+---------------+  Right External Iliac<50% stenosis          +--------------------+-------------+---------------+  Left External Iliac        50-99% stenosis  +--------------------+-------------+---------------+   Lower extremity arterial duplex 11/02/2018: Right: Atherosclerosis throughout the right lower extremity without focal stenosis.  Patent mid-distal SFA stent with 1-49% stenosis.  Left: Atherosclerosis throughout the left lower extremity.  Patent distal SFA stent with 1-49% stenosis.   Carotid artery duplex  08/07/2019:  Stenosis in the right internal carotid artery (16-49%). Stenosis in the  right external carotid artery (<50%).  Stenosis in the left internal carotid artery (16-49%). Stenosis in the  left common carotid artery (<50%).  Antegrade right vertebral artery flow. Antegrade left vertebral artery  flow.  Follow up in one year is appropriate if clinically indicated.   Echocardiogram 08/07/2019:  Normal LV systolic function with visual EF 50-55%. Left ventricle cavity  is normal in size. Normal global wall motion. No obvious regional wall  motion abnormalities. Mild concentric hypertrophy of the left ventricle.  Indeterminate  diastolic filling pattern, normal LAP. Calculated EF 50%.  Left atrial cavity is severely dilated.  Visually, right atrial cavity is mildly dilated.  Mild mitral regurgitation.  Moderate tricuspid regurgitation. No evidence of pulmonary hypertension. RVSP measures 33 mmHg.  Mild pulmonic regurgitation.  IVC is dilated with a respiratory response of >50%.  No significant change compared to previous study 05/25/2018.   Lexiscan (Walking with mod Bruce)Tetrofosmin Stress Test  08/07/2019: Nondiagnostic ECG stress. Perfusion imaging study is abnormal.  Mild inferior, inferolateral small sized defect consistent with ischemia. Left ventricular systolic function is mildly depressed, LVEF calculated at 49% with inferior hypokinesis. Compared to 10/22/2016 report- normal study.  Intermediate risk study.   Peripheral arteriogram 09/05/19: Abdominal aortogram revealed presence of 2 renal arteries 1 on either sides, widely patent.  Mild atherosclerotic changes abdominal aorta noted.  Bilateral aortoiliac bifurcation was widely patent with mild calcification.  There was diffuse moderate calcification noted throughout the entire iliac artery system.  Bilateral internal iliac arteries are occluded.  There is a old left  external iliac artery stent which has a 40 to 50% in-stent restenosis focally without pressure gradient.  Left common iliac artery has a 80% stenosis with a 20 to 30 mm pressure gradient. Left common femoral artery is very long and profunda bifurcation happens distal than normal, the left common femoral artery has a calcific 80 to 90% stenosis, brisk flow in the left SFA and old left SFA stent is widely patent.  Below the left knee there is two-vessel runoff with occluded left PT.  Right common femoral artery patch angioplasty site is widely patent.  Right SFA shows mild diffuse disease with moderate amount of calcification.  Previously placed right mid SFA stent is widely patent.  There is  three-vessel runoff below the right knee.  Intervention data: 1. Left common femoral artery: PTA with Shockwave lithotripsy with a 7.0 x 60 mm shockwave balloon followed by drug-coated balloon angioplasty with a 8 mm x 40 mm Lutonix angioplasty, stenosis reduced from 85% to less than 10% with brisk flow. 2. PTA and self-expanding 10.0 x 40 mm absolute Pro stent to the left common iliac artery followed by post angioplasty with a 9.0 x 40 mm Mustang at 8 atmospheric pressure for 60 seconds, stenosis reduced from 80% to 0%.  There was also left external iliac artery old stent that had a 50% stenosis was also stented with the same self-expanding stent overlapped followed by balloon angioplasty.  Overall no residual stenosis.  ABI 09/26/2019:  This exam reveals mildly decreased perfusion of the right lower extremity, noted at the anterior tibial artery level (ABI 0.83) and mildly decreased  perfusion of the left lower extremity, noted at the anterior tibial artery level (ABI 0.86). Compared to 08/31/2019, bilateral ABI 0.81. (H/O Left CIA stent and left CFA angioplasty).  EKG:  EKG 02/06/2020: Sinus bradycardia at a rate of 57 bpm, left atrial enlargement.  Normal axis.  Nonspecific T wave abnormality.  No evidence of ischemia compared to EKG 07/26/2019 no significant change.  07/26/2019: Marked sinus bradycardia at 48 bpm, normal axis.  Borderline low voltage complexes.  No evidence of ischemia.    Assessment     ICD-10-CM   1. Coronary artery disease involving native coronary artery of native heart without angina pectoris  I25.10 EKG 12-Lead    Pulse oximetry, overnight  2. Peripheral artery disease  I73.9   3. Tobacco use  Z72.0 Pulse oximetry, overnight  4. Shortness of breath  R06.02 Pulse oximetry, overnight  5. Other fatigue  R53.83 Pulse oximetry, overnight    Recommendations:   Brian Brock  is a 74 y.o. Caucasian male with CAD with multiple angioplasties over the years, due to  restenosis underwent CABG on 09/12/2012 but had recurrence of angina and LIMA occluded needing LAD stenting in 2016.  Past medical history significant for hypertension, hyperglycemia, hyperlipidemia, hypothyroidism, spontaneous DVT and pulmonary embolism in January 2020 and has been on Xarelto since then.  He has chronic dyspnea on exertion and COPD with ongoing tobacco use.  PAD significant for left external iliac artery and left SFA stenting 2014, left common iliac artery stenting and left common femoral artery shockwave lithotripsy followed by [facility] on 09/05/2019.  Patient presents today for 67-month follow-up of coronary artery disease and peripheral artery disease.  He is not experiencing chest pain, claudication.  He does have chronic dyspnea on exertion likely secondary to COPD and continued tobacco use.  He was diagnosed with COVID-19 last month and since then has had increased frequency of  dyspnea on exertion, however he reports it is improving.  Suspect this will continue to improve now that he is recovering from his COVID-19 infection, but will continue to monitor.  He has also been experiencing increased fatigue since this COVID-19 infection, this is also improving.    His primary concern today is that since last visit he has had a sleep study done which revealed nocturnal hypoxia, not sleep apnea.  However patient has not received home oxygen.  He also did not have nocturnal oximetry done.  Spoke with [employer] (oxygen supply company) representative at length and discovered patient will need overnight pulse oximetry in order for them to obtain home oxygen.  Will order overnight pulse oximetry, and follow results.  Patient will likely need home oxygen of about 2 L/min according to previous sleep study.   From a cardiac standpoint patient is stable without recurrence of angina pectoris or heart failure.  Blood pressure is well controlled.  Lipids are well controlled.  Will continue current medications.   Again discussed with patient the importance of smoking cessation.  Follow-up in January 2022 as scheduled for coronary artery disease, peripheral arterial disease, terminal hypoxia.  Patient was seen in collaboration with Dr. Einar Gip. He also reviewed patient's chart and Dr. Einar Gip is in agreement of the plan.    This was a 40-minute encounter with face-to-face counseling, medical records review, coordination of care, explanation of complex medical issues, complex medical decision making.     Alethia Berthold, PA-C 02/06/2020, 11:02 AM Office: [phone]

## 2020-02-06 ENCOUNTER — Encounter: Payer: Self-pay | Admitting: Cardiology

## 2020-02-06 ENCOUNTER — Other Ambulatory Visit: Payer: Self-pay

## 2020-02-06 ENCOUNTER — Ambulatory Visit: Payer: Medicare Other | Admitting: Cardiology

## 2020-02-06 VITALS — BP 130/62 | HR 55 | Resp 15 | Ht 69.0 in | Wt 188.0 lb

## 2020-02-06 DIAGNOSIS — R0602 Shortness of breath: Secondary | ICD-10-CM

## 2020-02-06 DIAGNOSIS — I739 Peripheral vascular disease, unspecified: Secondary | ICD-10-CM

## 2020-02-06 DIAGNOSIS — I2510 Atherosclerotic heart disease of native coronary artery without angina pectoris: Secondary | ICD-10-CM

## 2020-02-06 DIAGNOSIS — R5383 Other fatigue: Secondary | ICD-10-CM

## 2020-02-06 DIAGNOSIS — Z720 Tobacco use: Secondary | ICD-10-CM

## 2020-02-13 ENCOUNTER — Telehealth: Payer: Self-pay

## 2020-02-13 DIAGNOSIS — R5383 Other fatigue: Secondary | ICD-10-CM

## 2020-02-13 DIAGNOSIS — G4734 Idiopathic sleep related nonobstructive alveolar hypoventilation: Secondary | ICD-10-CM

## 2020-02-13 DIAGNOSIS — R0602 Shortness of breath: Secondary | ICD-10-CM

## 2020-02-13 NOTE — Telephone Encounter (Signed)
Patient called and wanted to know if the oxygen was ordered

## 2020-02-13 NOTE — Progress Notes (Signed)
Your procedure is scheduled on Wednesday, October 6th  Report to [facility] Main Entrance "A" at 6:30 A.M., and check in at the Admitting office.  Call this number if you have problems the morning of surgery: [phone]  Call [phone] if you have any questions prior to your surgery date Monday-Friday 8am-4pm  Remember: Do not eat after midnight the night before your surgery  You may drink clear liquids until 5:30 A.M. the morning of your surgery.   Clear liquids allowed are: Water, Non-Citrus Juices (without pulp), Carbonated Beverages, Clear Tea, Black Coffee Only, and Gatorade  Please complete your PRE-SURGERY ENSURE that was provided to you by 5:30 A.M. the morning of your surgery.  Please, if able, drink it in one setting. DO NOT SIP.   Take these medicines the morning of surgery with A SIP OF WATER: azithromycin (ZITHROMAX)  BYSTOLIC  cetirizine (ZYRTEC)  levothyroxine (SYNTHROID, LEVOTHROID) rosuvastatin (CRESTOR)  IF NEEDED: acetaminophen (TYLENOL)  cyclobenzaprine (FLEXERIL)  nitroGLYCERIN (NITROSTAT)   Follow your surgeon's instructions on when to stop Aspirin and rivaroxaban (XARELTO).  If no instructions were given by your surgeon then you will need to call the office to get those instructions.    As of today, STOP taking Aleve, Naproxen, Ibuprofen, Motrin, Advil, Goody's, BC's, all herbal medications, fish oil, and all vitamins.   The Morning of Surgery  Do not wear jewelry  Do not wear lotions, powders, colognes, or deodorant   Men may shave face and neck.  Do not bring valuables to the hospital.  [facility] is not responsible for any belongings or valuables.  If you are a smoker, DO NOT Smoke 24 hours prior to surgery  If you wear a CPAP at night please bring your mask the morning of surgery   Remember that you must have someone to transport you home after your surgery, and remain with you for 24 hours if you are discharged the same day.  Please bring  cases for contacts, glasses, hearing aids, dentures or bridgework because it cannot be worn into surgery.   Leave your suitcase in the car.  After surgery it may be brought to your room.  For patients admitted to the hospital, discharge time will be determined by your treatment team.  Patients discharged the day of surgery will not be allowed to drive home.   Special instructions:   [place]- Preparing For Surgery  Before surgery, you can play an important role. Because skin is not sterile, your skin needs to be as free of germs as possible. You can reduce the number of germs on your skin by washing with CHG (chlorahexidine gluconate) Soap before surgery.  CHG is an antiseptic cleaner which kills germs and bonds with the skin to continue killing germs even after washing.    Oral Hygiene is also important to reduce your risk of infection.  Remember - BRUSH YOUR TEETH THE MORNING OF SURGERY WITH YOUR REGULAR TOOTHPASTE  Please do not use if you have an allergy to CHG or antibacterial soaps. If your skin becomes reddened/irritated stop using the CHG.  Do not shave (including legs and underarms) for at least 48 hours prior to first CHG shower. It is OK to shave your face.  Please follow these instructions carefully.   1. Shower the NIGHT BEFORE SURGERY and the MORNING OF SURGERY with CHG Soap.   2. If you chose to wash your hair and body, wash as usual with your normal shampoo and body-wash/soap.  3. Rinse  your hair and body thoroughly to remove the shampoo and soap.  4. Apply CHG directly to the skin (ONLY FROM THE NECK DOWN) and wash gently with a scrungie or a clean washcloth.   5. Do not use on open wounds or open sores. Avoid contact with your eyes, ears, mouth and genitals (private parts). Wash Face and genitals (private parts)  with your normal soap.   6. Wash thoroughly, paying special attention to the area where your surgery will be performed.  7. Thoroughly rinse your body  with warm water from the neck down.  8. DO NOT shower/wash with your normal soap after using and rinsing off the CHG Soap.  9. Pat yourself dry with a CLEAN TOWEL.  10. Wear CLEAN PAJAMAS to bed the night before surgery  11. Place CLEAN SHEETS on your bed the night of your first shower and DO NOT SLEEP WITH PETS.  12. Wear comfortable clothes the morning of surgery.   Day of Surgery: Please shower the morning of surgery with the CHG soap Do not apply any deodorants/lotions. Please wear clean clothes to the hospital/surgery center.   Remember to brush your teeth WITH YOUR REGULAR TOOTHPASTE.   Please read over the following fact sheets that you were given.

## 2020-02-14 ENCOUNTER — Other Ambulatory Visit: Payer: Self-pay

## 2020-02-14 ENCOUNTER — Encounter (HOSPITAL_COMMUNITY): Payer: Self-pay

## 2020-02-14 ENCOUNTER — Encounter (HOSPITAL_COMMUNITY)
Admission: RE | Admit: 2020-02-14 | Discharge: 2020-02-14 | Disposition: A | Discharge status: Home / Self Care | Payer: Medicare Other | Source: Ambulatory Visit | Attending: General Surgery | Admitting: General Surgery

## 2020-02-14 DIAGNOSIS — Z79899 Other long term (current) drug therapy: Secondary | ICD-10-CM | POA: Diagnosis not present

## 2020-02-14 DIAGNOSIS — E785 Hyperlipidemia, unspecified: Secondary | ICD-10-CM | POA: Insufficient documentation

## 2020-02-14 DIAGNOSIS — Z951 Presence of aortocoronary bypass graft: Secondary | ICD-10-CM | POA: Insufficient documentation

## 2020-02-14 DIAGNOSIS — Z86718 Personal history of other venous thrombosis and embolism: Secondary | ICD-10-CM | POA: Insufficient documentation

## 2020-02-14 DIAGNOSIS — Z7982 Long term (current) use of aspirin: Secondary | ICD-10-CM | POA: Diagnosis not present

## 2020-02-14 DIAGNOSIS — Z8679 Personal history of other diseases of the circulatory system: Secondary | ICD-10-CM | POA: Diagnosis not present

## 2020-02-14 DIAGNOSIS — Z79890 Hormone replacement therapy (postmenopausal): Secondary | ICD-10-CM | POA: Insufficient documentation

## 2020-02-14 DIAGNOSIS — I2510 Atherosclerotic heart disease of native coronary artery without angina pectoris: Secondary | ICD-10-CM | POA: Diagnosis not present

## 2020-02-14 DIAGNOSIS — K589 Irritable bowel syndrome without diarrhea: Secondary | ICD-10-CM | POA: Insufficient documentation

## 2020-02-14 DIAGNOSIS — I10 Essential (primary) hypertension: Secondary | ICD-10-CM | POA: Insufficient documentation

## 2020-02-14 DIAGNOSIS — E039 Hypothyroidism, unspecified: Secondary | ICD-10-CM | POA: Diagnosis not present

## 2020-02-14 DIAGNOSIS — I739 Peripheral vascular disease, unspecified: Secondary | ICD-10-CM | POA: Insufficient documentation

## 2020-02-14 DIAGNOSIS — Z955 Presence of coronary angioplasty implant and graft: Secondary | ICD-10-CM | POA: Insufficient documentation

## 2020-02-14 DIAGNOSIS — G4736 Sleep related hypoventilation in conditions classified elsewhere: Secondary | ICD-10-CM | POA: Diagnosis not present

## 2020-02-14 DIAGNOSIS — R7303 Prediabetes: Secondary | ICD-10-CM | POA: Insufficient documentation

## 2020-02-14 DIAGNOSIS — F17210 Nicotine dependence, cigarettes, uncomplicated: Secondary | ICD-10-CM | POA: Insufficient documentation

## 2020-02-14 DIAGNOSIS — K4091 Unilateral inguinal hernia, without obstruction or gangrene, recurrent: Secondary | ICD-10-CM | POA: Diagnosis not present

## 2020-02-14 DIAGNOSIS — I252 Old myocardial infarction: Secondary | ICD-10-CM | POA: Diagnosis not present

## 2020-02-14 DIAGNOSIS — Z8616 Personal history of COVID-19: Secondary | ICD-10-CM | POA: Insufficient documentation

## 2020-02-14 DIAGNOSIS — Z7901 Long term (current) use of anticoagulants: Secondary | ICD-10-CM | POA: Diagnosis not present

## 2020-02-14 DIAGNOSIS — K219 Gastro-esophageal reflux disease without esophagitis: Secondary | ICD-10-CM | POA: Diagnosis not present

## 2020-02-14 DIAGNOSIS — Z01812 Encounter for preprocedural laboratory examination: Secondary | ICD-10-CM | POA: Insufficient documentation

## 2020-02-14 DIAGNOSIS — J449 Chronic obstructive pulmonary disease, unspecified: Secondary | ICD-10-CM | POA: Diagnosis not present

## 2020-02-14 HISTORY — DX: Prediabetes: R73.03

## 2020-02-14 LAB — HEMOGLOBIN A1C
Hgb A1c MFr Bld: 6.3 % — ABNORMAL HIGH (ref 4.8–5.6)
Mean Plasma Glucose: 134.11 mg/dL

## 2020-02-14 LAB — BASIC METABOLIC PANEL
Anion gap: 7 (ref 5–15)
BUN: 21 mg/dL (ref 8–23)
CO2: 22 mmol/L (ref 22–32)
Calcium: 8.9 mg/dL (ref 8.9–10.3)
Chloride: 109 mmol/L (ref 98–111)
Creatinine, Ser: 0.99 mg/dL (ref 0.61–1.24)
GFR calc Af Amer: >60 mL/min (ref >60)
GFR calc non Af Amer: >60 mL/min (ref >60)
Glucose, Bld: 96 mg/dL (ref 70–99)
Potassium: 4 mmol/L (ref 3.5–5.1)
Sodium: 138 mmol/L (ref 135–145)

## 2020-02-14 LAB — CBC
HCT: 39.9 % (ref 39.0–52.0)
Hemoglobin: 12.9 g/dL — ABNORMAL LOW (ref 13.0–17.0)
MCH: 28.5 pg (ref 26.0–34.0)
MCHC: 32.3 g/dL (ref 30.0–36.0)
MCV: 88.3 fL (ref 80.0–100.0)
Platelets: 250 10*3/uL (ref 150–400)
RBC: 4.52 MIL/uL (ref 4.22–5.81)
RDW: 13.6 % (ref 11.5–15.5)
WBC: 7.4 10*3/uL (ref 4.0–10.5)
nRBC: 0 % (ref 0.0–0.2)

## 2020-02-14 NOTE — Progress Notes (Addendum)
PCP - Audree Camel. Unk Lightning, MD Cardiologist - Adrian Prows, MD  PPM/ICD - Denies  Chest x-ray - N/A EKG - 02/06/20 Stress Test - 08/07/19 ECHO - 08/07/19 Cardiac Cath - 07/24/14  Sleep Study - Yes, 12/26/19; negative for OSA CPAP - N/A  Patient is prediabetic. A1c obtained.  Blood Thinner Instructions: Patient instructed to call MDs office for Xarelto instructions Aspirin Instructions: Patient instructed to call MDs office  ERAS Protcol - Yes PRE-SURGERY Ensure or G2- Ensure given  COVID TEST- N/A; Patient tested positive for COVID 01/17/20; currently asymptomatic.   Anesthesia review: Yes, cardiac hx.  Patient denies shortness of breath, fever, cough and chest pain at PAT appointment   All instructions explained to the patient, with a verbal understanding of the material. Patient agrees to go over the instructions while at home for a better understanding. Patient also instructed to self quarantine after being tested for COVID-19. The opportunity to ask questions was provided.

## 2020-02-15 NOTE — Anesthesia Preprocedure Evaluation (Addendum)
Anesthesia Evaluation  Patient identified by MRN, date of birth, ID band Patient awake    Reviewed: Allergy & Precautions, NPO status , Patient's Chart, lab work & pertinent test results, reviewed documented beta blocker date and time   Airway Mallampati: II  TM Distance: >3 FB Neck ROM: Full    Dental  (+) Partial Upper, Partial Lower   Pulmonary asthma , COPD, Current Smoker and Patient abstained from smoking.,  + COVID 01/17/20   Pulmonary exam normal breath sounds clear to auscultation       Cardiovascular hypertension, Pt. on home beta blockers and Pt. on medications + angina + CAD, + Past MI, + Cardiac Stents and + Peripheral Vascular Disease  Normal cardiovascular exam+ dysrhythmias Atrial Fibrillation  Rhythm:Regular Rate:Normal  Echocardiogram 08/07/2019:  Normal LV systolic function with visual EF 50-55%. Left ventricle cavity  is normal in size. Normal global wall motion. No obvious regional wall  motion abnormalities. Mild concentric hypertrophy of the left ventricle.  Indeterminate diastolic filling pattern, normal LAP. Calculated EF 50%.  Left atrial cavity is severely dilated.  Visually, right atrial cavity is mildly dilated.  Mild mitral regurgitation.  Moderate tricuspid regurgitation. No evidence of pulmonary hypertension.  RVSP measures 33 mmHg.  Mild pulmonic regurgitation.  IVC is dilated with a respiratory response of >50%.  No significant change compared to previous study 05/25/2018.  Lexiscan (Walking with mod Bruce)Tetrofosmin Stress Test  08/07/2019: Nondiagnostic ECG stress. Perfusion imaging study is abnormal.  Mild inferior, inferolateral small sized defect consistent with ischemia. Left ventricular systolic function is mildly depressed, LVEF calculated at 49% with inferior hypokinesis. Compared to 10/22/2016 report- normal study.  Intermediate risk study.     Neuro/Psych negative neurological  ROS  negative psych ROS   GI/Hepatic Neg liver ROS, hiatal hernia, GERD  Medicated,  Endo/Other  Hypothyroidism   Renal/GU negative Renal ROS     Musculoskeletal  (+) Arthritis ,   Abdominal   Peds  Hematology  (+) Blood dyscrasia (Xarelto), anemia ,   Anesthesia Other Findings   Reproductive/Obstetrics                           Anesthesia Physical Anesthesia Plan  ASA: III  Anesthesia Plan: General   Post-op Pain Management:    Induction: Intravenous  PONV Risk Score and Plan: 2 and Dexamethasone and Ondansetron  Airway Management Planned: Oral ETT  Additional Equipment:   Intra-op Plan:   Post-operative Plan: Extubation in OR  Informed Consent: I have reviewed the patients History and Physical, chart, labs and discussed the procedure including the risks, benefits and alternatives for the proposed anesthesia with the patient or authorized representative who has indicated his/her understanding and acceptance.       Plan Discussed with: CRNA  Anesthesia Plan Comments: (See PAT note written by Myra Gianotti, PA-C. + COVID 01/17/20. Cardiologist is Dr. Einar Gip. He had recent sleep study showing no significant OSA, few recorded events of central apnea, but with sleep hypoxemia with 45 minutes total desaturation time with lowest being 67%--he is awaiting insurance approval for nocturnal pulse oximetry study before home O2 can be arranged.   )     Anesthesia Quick Evaluation

## 2020-02-15 NOTE — Progress Notes (Addendum)
Anesthesia Chart Review:  Case: [id number] Date/Time: 02/21/20 0815   Procedure: LAPAROSCOPIC RIGHT INGUINAL HERNIA REPAIR WITH MESH (Right )   Anesthesia type: General   Pre-op diagnosis: RIGHT INGUINAL HERNIA   Location: [place] OR ROOM 01 / [place] OR   Surgeons: Ralene Ok, MD      DISCUSSION: Patient is a 74 year old male scheduled for the above procedure.  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 + COVID-19 01/17/20.  He had a sleep study 12/26/19 that showed no significant sleep apnea, mostly central apnea among few recorded event, he did have some sleep hypoxemia (45 minutes < 89%, O2 nadir 67%). Before nocturnal oxygen can be ordered, an overnight oximetry has to be completed. It has been ordered recently, but has not been scheduled while waiting approval from his insurance. (Accroding to 01/23/20 notation by Dr. Einar Gip, nocturnal hypoxemia was felt to be chronic and "no urgency in oxygen for him as he is not hypoxemic during daytime and this is unrelated to Covid infection".)     Last cardiology visit 02/06/20 with Lawerance Cruel, PA-C on 02/06/20. Overnight oximetry ordered as above. From a cardiac standpoint, he was felt stable. Follow-up planned ~ 05/2020. He previously had a cardiac clearance from Dr. Einar Gip on 12/08/19 (see Letters tab), indicating "is at low risk, from a cardiac standpoint, for his upcoming procedure:right inguinal hernia repair.  It is ok to  proceed without further cardiac testing.  If applicable can hold  Xarelto for 2 days prior to procedure and re-start same day or next day if safe post procedure." Confirmed with patient that he is aware to hold Xarelto for 48 hours for surgery.   Patient had positive COVID-19 test on 01/17/20 (copy on chart from [facility]). He said he received an "IgG infusion". Overall, feeling asymptomatic--still getting his energy back but had issues even before COVID and before bedtime often feels he has to clear his lungs before lying down to do to sleep. I discussed his recent sleep study results with anesthesiologist Nolon Nations, MD. He will be monitored in PACU, but it is possible he could require 23 hour observation. I notified Mr. Wichmann and will communicate with Dr. Rosendo Gros as well.    VS: BP 127/75   Pulse (!) 53   Temp 36.8 C (Oral)   Resp 17   Ht 5\' 9"  (1.753 m)   Wt 84.7 kg   SpO2 98%   BMI 27.59 kg/m     PROVIDERS: [name], MD is PCP  Adrian Prows, MD is cardiologist Tish Men, MD is hematologist (DVT/PE) given DVT and PE unprovoked, goal of anticoagulation is "lifelong." Dohmeier, Asencion Partridge, MD is neurologist (OSA)   LABS: Labs reviewed: Acceptable for surgery. (all labs ordered are listed, but only abnormal results are displayed)  Labs Reviewed  HEMOGLOBIN A1C - Abnormal; Notable for the following components:      Result Value   Hgb A1c MFr Bld 6.3 (*)    All other components within normal limits  CBC - Abnormal; Notable for the  following components:   Hemoglobin 12.9 (*)    All other components within normal limits  BASIC METABOLIC PANEL    Split Night Study 12/26/19 (Dohmeier, Asencion Partridge, MD) IMPRESSION:  1. No clinically significant degree of Sleep Apnea. Mostly  central apnea among few recorded events.  2. Sleep hypoxemia- 02 nadir was 67% and total desaturation time  45 minutes- in a tobacco user with CAD and PAD, this is  significant.   [The [place] baseline 02 saturation was 92%, with the lowest being 67%]  3. Clinically insignificant Periodic Limb Movement Disorder  (PLMD) not causing arousals. When in wakeful state, the patient  did have much more frequent limb movements, these were  non-periodic and rather myoclonic.  Non-specific abnormal EKG (I attached screen shots).  RECOMMENDATIONS:  1. There is no need for CPAP. The main identified problem is  hypoxemia during REM sleep, and the over all AHI doesn't allow  for positive airway pressure therapy.  2. I cannot order oxygen without PAP therapy, that can only be  done by pulmonology or cardiology.   3. The patient wakes frequently with a myoclonic jerk, not a  periodic limb movement. This can be related to PAD. It can be  seen as a response to hypoxia, too. Tobacco cessation is urgently  needed.     EKG: 02/06/20:  Sinus  Bradycardia at 57 bpm Low voltage with rightward P-axis and rotation -possible pulmonary disease.   CV: Lower Extremity Arterial Duplex 08/31/2019:  No hemodynamical significant stenosis noted in both lower extremity.  Diffuse calcific plaque noted in the lower extremity. Monophasic waveform  at the right AT and PT suggests diffuse disease.  Left SFA shows monophasic waveform at the level of the proximal and mid  SFA and suggests severe diffuse disease. Monophasic waveform in right AT  and PT suggests diffuse disease.  This exam reveals mildly decreased perfusion of the right lower extremity,  noted at the dorsalis pedis and post tibial artery level (ABI 0.82 and  mildly decreased perfusion of the left lower extremity, noted at the  dorsalis pedis artery level (ABI 0.82). No significant change from  11/02/2018.  Carotid artery duplex 08/07/2019:  Stenosis in the right internal carotid artery (16-49%). Stenosis in the  right external carotid artery (<50%).  Stenosis in the left internal carotid artery (16-49%). Stenosis in the  left  common carotid artery (<50%).  Antegrade right vertebral artery flow. Antegrade left vertebral artery  flow.  Follow up in one year is appropriate if clinically indicated.  Echocardiogram 08/07/2019:  Normal LV systolic function with visual EF 50-55%. Left ventricle cavity  is normal in size. Normal global wall motion. No obvious regional wall  motion abnormalities. Mild concentric hypertrophy of the left ventricle.  Indeterminate diastolic filling pattern, normal LAP. Calculated EF 50%.  Left atrial cavity is severely dilated.  Visually, right atrial cavity is mildly dilated.  Mild mitral regurgitation.  Moderate tricuspid regurgitation. No evidence of pulmonary hypertension.  RVSP measures 33 mmHg.  Mild pulmonic regurgitation.  IVC is dilated with a respiratory response of >50%.  No significant change compared to previous study 05/25/2018.  Lexiscan (Walking with mod Bruce)Tetrofosmin Stress Test  08/07/2019: Nondiagnostic ECG stress. Perfusion imaging study is abnormal.  Mild inferior, inferolateral small sized defect consistent with ischemia. Left ventricular systolic function is mildly depressed, LVEF calculated at 49% with inferior hypokinesis. Compared to 10/22/2016 report- normal study.  Intermediate risk study.   Cardiac cath 07/24/2014:  IMPRESSION: - Multi vessel coronary  artery disease with 70% ostial LAD stenosis, 40-50% narrowing in the proximal LAD stent and 95-99% stenosis after the stented segment in the native LAD with 60% ostial septal perforating artery stenosis; 20th percent ostial 30% proximal circumflex stenosis with widely patent circumflex stent; and widely patent proximal and mid RCA stents. - Occluded LIMA graft in its midsegment prior to its anastomosis into the LAD. - Widely patent saphenous vein graft supplying the distal circumflex marginal vessel. - Successful percutaneous coronary intervention to the LAD at its ostium and in the midsegment treated with  Angiosculpt scoring balloon, insertion of a 3.034 mm resolute stent extending from the ostium to the mid LAD, postdilated to 3.41 mm with the entire region been reduced to 0% and evidence for brisk TIMI-3 flow. RECOMMENDATION:  The patient should continue on DAPT indefinitely. Smoking cessation is essential.  11/04/12-11/17/12 Event monitor: SR, SB, sinus arrhythmia, second atrial run.   Past Medical History:  Diagnosis Date  . AF (atrial fibrillation) ([place])    happened after CABG, has not had it since  . Allergic rhinitis   . Allergy   . Anginal pain ([place]) 1997  . Arthritis    "lower back" (09/26/2015)  . Childhood asthma   . Chronic bronchitis ([place])    Yearly  . COPD (chronic obstructive pulmonary disease) ([place])   . Coronary artery disease    Multiple stents  . Dyslipidemia   . Dysrhythmia   . Femoral hernia of right side 12/28/2016  . GERD (gastroesophageal reflux disease)   . History of hiatal hernia   . Hyperlipemia   . Hypertension   . Hypothyroidism   . Irritable bowel syndrome   . Myocardial infarct ([place]) 1997   "mild"  . PE (pulmonary thromboembolism) ([place]) 05/2018  . Peripheral vascular disease ([place])    prior stenting  . Pre-diabetes   . Restless legs     Past Surgical History:  Procedure Laterality Date  . CARDIAC CATHETERIZATION  2004   had remote tandem stents in 1997 and 1998 for LAD lesions, RCA stent in 1997 , midprto=ion, and a proximal RCA  SENT IN 1999, all DES; some were IVIS  guide.  [name] CARDIAC CATHETERIZATION  09/07/2012   demonstrated severe 2- vessel disease,patent LAD,patient circ and patent right stents, but 90% ostial LAD whic was new.  . CAROTID DOPPLER  100/24/11   RGT BULB and PROXIMAL ICA 0-49%; LFT. ICA  0-49 ; RGT SUBCLAVIAN ARTERY < 50% ; LFT SUBCLAVIAN normal patency; RGT  and LFT  VERTEBRAL ARTERIES  . COLONOSCOPY  11/06/2016   Diverticulosis of colon. Internal hemorrhoids.   . CORONARY ANGIOPLASTY    . CORONARY ANGIOPLASTY WITH STENT  PLACEMENT  '97, '98, '04, '06, '08, '10   seven cardiac stents  . CORONARY ARTERY BYPASS GRAFT N/A 09/12/2012   Procedure: OFF PUMP CORONARY ARTERY BYPASS GRAFTING (CABG);  Surgeon: Melrose Nakayama, MD;  Location: [place];  Service: Open Heart Surgery;  Laterality: N/A;  Times two using left internal mammary artery and endoscopically harvested right saphenous vein  ;LIMA-LAD and SVG -OM1  . DOPPLER ECHOCARDIOGRAPHY  11/23/2012,09/30/2010    LV EF 50-55%----09/30/2010-LV EF = >55%  . ENDARTERECTOMY FEMORAL Right 12/19/2015   Procedure: ENDARTERECTOMY RIGHT FEMORAL ARTERY;  Surgeon: Serafina Mitchell, MD;  Location: [place];  Service: Vascular;  Laterality: Right;  . event monitor  11/04/2012-11/17/2012   pt denies this hx on 07/24/2014  . EYE SURGERY Bilateral    cataract  . FEMORAL ARTERY STENT  Bilateral 7/06  8/08   Lt SFA '06, Rt SFA 8/08  . HERNIA REPAIR  10/2008   "repaired w/gallbladder OR"  . HERNIA REPAIR  07/2016  . INGUINAL HERNIA REPAIR Right 12/28/2016   Procedure: OPEN REPAIR RIGHT GROIN HERNIA WITH MESH;  Surgeon: Fanny Skates, MD;  Location: [place];  Service: General;  Laterality: Right;  . INTRAVASCULAR LITHOTRIPSY  09/05/2019   Procedure: INTRAVASCULAR LITHOTRIPSY;  Surgeon: Adrian Prows, MD;  Location: [place] CV LAB;  Service: Cardiovascular;;  left common femoral  . LAPAROSCOPIC CHOLECYSTECTOMY  June 2010  . LEFT HEART CATHETERIZATION WITH CORONARY ANGIOGRAM N/A 09/07/2012   Procedure: LEFT HEART CATHETERIZATION WITH CORONARY ANGIOGRAM;  Surgeon: Troy Sine, MD;  Location: [facility] C[facility] LAB;  Service: Cardiovascular;  Laterality: N/A;  . LEFT HEART CATHETERIZATION WITH CORONARY/GRAFT ANGIOGRAM N/A 07/24/2014   Procedure: LEFT HEART CATHETERIZATION WITH Beatrix Fetters;  Surgeon: Troy Sine, MD; LAD 70%, 50% ISR, mLAD 99%, CFX 30%, patent stent, RCA patent, LIMA-LAD occluded, SVG-OM 1 patent, EF 50-55%  . LEXISCAN STRESS TEST  09/06/2012  . LOWER ARTERIAL DOPPLER   03/25/2012   FGT CIA 0-49%; RGT CFA AND SFA AT STENT 0-49%; RGT DISTAL SFA = <50%; LFT. EIA  50-69%; LFT. CFA 0-49% ;;LFT. SFA  AT STENT NORMAL PATENCY  . LOWER EXTREMITY ANGIOGRAPHY N/A 09/05/2019   Procedure: LOWER EXTREMITY ANGIOGRAPHY;  Surgeon: Adrian Prows, MD;  Location: [place] CV LAB;  Service: Cardiovascular;  Laterality: N/A;  . NM MYOVIEW LTD  05/06/2012   EF -STUDY NOT GATED, NORMAL STRESS NUCLEAR ,NORMAL LV FUNCTION  . PATCH ANGIOPLASTY Right 12/19/2015   Procedure: PATCH ANGIOPLASTY RIGHT FEMORAL ARTERY;  Surgeon: Serafina Mitchell, MD;  Location: [place];  Service: Vascular;  Laterality: Right;  . PERCUTANEOUS CORONARY STENT INTERVENTION (PCI-S) Right 07/24/2014   Procedure: PERCUTANEOUS CORONARY STENT INTERVENTION (PCI-S);  Surgeon: Troy Sine, MD; ostial LAD PTCA with angioscope scoring balloon, 3.0 x 34 mm Resolute DES extending from the ostium to the mid LAD, covering all significant stenoses   . PERIPHERAL VASCULAR CATHETERIZATION N/A 09/26/2015   Procedure: Abdominal Aortogram w/Lower Extremity;  Surgeon: Lorretta Harp, MD;  Location: [place] CV LAB;  Service: Cardiovascular;  Laterality: N/A;  . PERIPHERAL VASCULAR CATHETERIZATION Right 02/20/2016   Procedure: Peripheral Vascular Atherectomy;  Surgeon: Lorretta Harp, MD;  Location: [place] CV LAB;  Service: Cardiovascular;  Laterality: Right;  SFA  . PERIPHERAL VASCULAR CATHETERIZATION Right 02/20/2016   Procedure: Peripheral Vascular Balloon Angioplasty;  Surgeon: Lorretta Harp, MD;  Location: [place] CV LAB;  Service: Cardiovascular;  Laterality: Right;  SFA  . PERIPHERAL VASCULAR INTERVENTION  09/05/2019   Procedure: PERIPHERAL VASCULAR INTERVENTION;  Surgeon: Adrian Prows, MD;  Location: [place] CV LAB;  Service: Cardiovascular;;  left common iliac  . TONSILLECTOMY  1950's  . UPPER GASTROINTESTINAL ENDOSCOPY      MEDICATIONS: . acetaminophen (TYLENOL) 500 MG tablet  . Ascorbic Acid (VITAMIN C) 1000  MG tablet  . aspirin EC 81 MG tablet  . BYSTOLIC 5 MG tablet  . cetirizine (ZYRTEC) 10 MG tablet  . Cholecalciferol (VITAMIN D3) 50 MCG (2000 UT) TABS  . cholestyramine light (PREVALITE) 4 g packet  . cyclobenzaprine (FLEXERIL) 10 MG tablet  . ipratropium (ATROVENT) 0.03 % nasal spray  . levothyroxine (SYNTHROID, LEVOTHROID) 25 MCG tablet  . loperamide (IMODIUM A-D) 2 MG tablet  . losartan (COZAAR) 100 MG tablet  . Melatonin 12 MG TABS  . Multiple Vitamin (MULTIVITAMIN  WITH MINERALS) TABS tablet  . nitroGLYCERIN (NITROSTAT) 0.4 MG SL tablet  . pantoprazole (PROTONIX) 40 MG tablet  . Probiotic Product (PROBIOTIC PO)  . rivaroxaban (XARELTO) 20 MG TABS tablet  . rosuvastatin (CRESTOR) 20 MG tablet  . Zinc 50 MG TABS  . zolpidem (AMBIEN) 10 MG tablet   No current facility-administered medications for this encounter.    Myra Gianotti, PA-C Surgical Short Stay/Anesthesiology [facility] Phone [phone] [facility] Phone [phone] 02/15/2020 6:17 PM

## 2020-02-15 NOTE — Telephone Encounter (Signed)
Patient has been informed that our office if working with his insurance company to set up overnight oximetry and once that is done [employer] will set him up with oxygen.

## 2020-02-17 ENCOUNTER — Other Ambulatory Visit (HOSPITAL_COMMUNITY): Payer: Medicare Other

## 2020-02-21 ENCOUNTER — Ambulatory Visit (HOSPITAL_COMMUNITY)
Admission: RE | Admit: 2020-02-21 | Discharge: 2020-02-21 | Disposition: A | Discharge status: Home / Self Care | Payer: Medicare Other | Attending: General Surgery | Admitting: General Surgery

## 2020-02-21 ENCOUNTER — Ambulatory Visit (HOSPITAL_COMMUNITY): Payer: Medicare Other | Admitting: Anesthesiology

## 2020-02-21 ENCOUNTER — Ambulatory Visit (HOSPITAL_COMMUNITY): Payer: Medicare Other | Admitting: Emergency Medicine

## 2020-02-21 ENCOUNTER — Encounter (HOSPITAL_COMMUNITY)
Admission: RE | Disposition: A | Discharge status: Home / Self Care | Payer: Self-pay | Source: Home / Self Care | Attending: General Surgery

## 2020-02-21 ENCOUNTER — Encounter (HOSPITAL_COMMUNITY): Payer: Self-pay | Admitting: General Surgery

## 2020-02-21 ENCOUNTER — Other Ambulatory Visit: Payer: Self-pay

## 2020-02-21 DIAGNOSIS — Z79899 Other long term (current) drug therapy: Secondary | ICD-10-CM | POA: Insufficient documentation

## 2020-02-21 DIAGNOSIS — Z8249 Family history of ischemic heart disease and other diseases of the circulatory system: Secondary | ICD-10-CM | POA: Diagnosis not present

## 2020-02-21 DIAGNOSIS — Z886 Allergy status to analgesic agent status: Secondary | ICD-10-CM | POA: Insufficient documentation

## 2020-02-21 DIAGNOSIS — I2510 Atherosclerotic heart disease of native coronary artery without angina pectoris: Secondary | ICD-10-CM | POA: Insufficient documentation

## 2020-02-21 DIAGNOSIS — Z8719 Personal history of other diseases of the digestive system: Secondary | ICD-10-CM

## 2020-02-21 DIAGNOSIS — Z7982 Long term (current) use of aspirin: Secondary | ICD-10-CM | POA: Insufficient documentation

## 2020-02-21 DIAGNOSIS — Z8616 Personal history of COVID-19: Secondary | ICD-10-CM | POA: Diagnosis not present

## 2020-02-21 DIAGNOSIS — I739 Peripheral vascular disease, unspecified: Secondary | ICD-10-CM | POA: Insufficient documentation

## 2020-02-21 DIAGNOSIS — Z888 Allergy status to other drugs, medicaments and biological substances status: Secondary | ICD-10-CM | POA: Diagnosis not present

## 2020-02-21 DIAGNOSIS — Z86711 Personal history of pulmonary embolism: Secondary | ICD-10-CM | POA: Insufficient documentation

## 2020-02-21 DIAGNOSIS — Z7901 Long term (current) use of anticoagulants: Secondary | ICD-10-CM | POA: Diagnosis not present

## 2020-02-21 DIAGNOSIS — Z882 Allergy status to sulfonamides status: Secondary | ICD-10-CM | POA: Insufficient documentation

## 2020-02-21 DIAGNOSIS — K4091 Unilateral inguinal hernia, without obstruction or gangrene, recurrent: Secondary | ICD-10-CM | POA: Diagnosis present

## 2020-02-21 DIAGNOSIS — M1990 Unspecified osteoarthritis, unspecified site: Secondary | ICD-10-CM | POA: Diagnosis not present

## 2020-02-21 DIAGNOSIS — I252 Old myocardial infarction: Secondary | ICD-10-CM | POA: Diagnosis not present

## 2020-02-21 DIAGNOSIS — Z95820 Peripheral vascular angioplasty status with implants and grafts: Secondary | ICD-10-CM | POA: Diagnosis not present

## 2020-02-21 DIAGNOSIS — Z7902 Long term (current) use of antithrombotics/antiplatelets: Secondary | ICD-10-CM | POA: Insufficient documentation

## 2020-02-21 DIAGNOSIS — Z881 Allergy status to other antibiotic agents status: Secondary | ICD-10-CM | POA: Diagnosis not present

## 2020-02-21 DIAGNOSIS — I4891 Unspecified atrial fibrillation: Secondary | ICD-10-CM | POA: Insufficient documentation

## 2020-02-21 DIAGNOSIS — Z9889 Other specified postprocedural states: Secondary | ICD-10-CM

## 2020-02-21 DIAGNOSIS — J449 Chronic obstructive pulmonary disease, unspecified: Secondary | ICD-10-CM | POA: Insufficient documentation

## 2020-02-21 DIAGNOSIS — G4734 Idiopathic sleep related nonobstructive alveolar hypoventilation: Secondary | ICD-10-CM

## 2020-02-21 DIAGNOSIS — F17200 Nicotine dependence, unspecified, uncomplicated: Secondary | ICD-10-CM | POA: Diagnosis not present

## 2020-02-21 HISTORY — PX: INGUINAL HERNIA REPAIR: SHX194

## 2020-02-21 LAB — GLUCOSE, CAPILLARY: Glucose-Capillary: 109 mg/dL — ABNORMAL HIGH (ref 70–99)

## 2020-02-21 SURGERY — REPAIR, HERNIA, INGUINAL, LAPAROSCOPIC
Anesthesia: General | Site: Abdomen | Laterality: Right

## 2020-02-21 MED ORDER — FENTANYL CITRATE (PF) 250 MCG/5ML IJ SOLN
INTRAMUSCULAR | Status: DC | PRN
Start: 2020-02-21 — End: 2020-02-21
  Administered 2020-02-21: 50 ug via INTRAVENOUS
  Administered 2020-02-21: 100 ug via INTRAVENOUS

## 2020-02-21 MED ORDER — DEXAMETHASONE SODIUM PHOSPHATE 10 MG/ML IJ SOLN: 4.0000 mg | INTRAMUSCULAR | Status: DC

## 2020-02-21 MED ORDER — SUGAMMADEX SODIUM 200 MG/2ML IV SOLN
INTRAVENOUS | Status: DC | PRN
  Administered 2020-02-21: 200 mg via INTRAVENOUS

## 2020-02-21 MED ORDER — FENTANYL CITRATE (PF) 100 MCG/2ML IJ SOLN: 25.0000 ug | INTRAMUSCULAR | Status: DC | PRN

## 2020-02-21 MED ORDER — EPHEDRINE SULFATE 50 MG/ML IJ SOLN
INTRAMUSCULAR | Status: DC | PRN
  Administered 2020-02-21 (×2): 5 mg via INTRAVENOUS

## 2020-02-21 MED ORDER — OXYCODONE HCL 5 MG PO TABS
ORAL_TABLET | ORAL | Status: AC
  Filled 2020-02-21: qty 1

## 2020-02-21 MED ORDER — ACETAMINOPHEN 500 MG PO TABS: 1000.0000 mg | ORAL_TABLET | ORAL | Status: AC

## 2020-02-21 MED ORDER — ONDANSETRON HCL 4 MG/2ML IJ SOLN: 4.0000 mg | Freq: Once | INTRAMUSCULAR | Status: DC | PRN

## 2020-02-21 MED ORDER — STERILE WATER FOR IRRIGATION IR SOLN
Status: DC | PRN
  Administered 2020-02-21: 100 mL

## 2020-02-21 MED ORDER — TRAMADOL HCL 50 MG PO TABS: 50.0000 mg | ORAL_TABLET | Freq: Four times a day (QID) | ORAL | 0 refills | Status: DC | PRN

## 2020-02-21 MED ORDER — GLYCOPYRROLATE PF 0.2 MG/ML IJ SOSY
PREFILLED_SYRINGE | INTRAMUSCULAR | Status: DC | PRN
  Administered 2020-02-21: .2 mg via INTRAVENOUS

## 2020-02-21 MED ORDER — LACTATED RINGERS IV SOLN: INTRAVENOUS | Status: DC

## 2020-02-21 MED ORDER — LIDOCAINE 2% (20 MG/ML) 5 ML SYRINGE
INTRAMUSCULAR | Status: DC | PRN
  Administered 2020-02-21: 80 mg via INTRAVENOUS

## 2020-02-21 MED ORDER — BUPIVACAINE-EPINEPHRINE 0.25% -1:200000 IJ SOLN
INTRAMUSCULAR | Status: DC | PRN
  Administered 2020-02-21: 10 mL

## 2020-02-21 MED ORDER — SODIUM CHLORIDE 0.9 % IR SOLN
Status: DC | PRN
  Administered 2020-02-21: 1000 mL

## 2020-02-21 MED ORDER — ENSURE PRE-SURGERY PO LIQD: 296.0000 mL | Freq: Once | ORAL | Status: DC

## 2020-02-21 MED ORDER — MIDAZOLAM HCL 2 MG/2ML IJ SOLN
INTRAMUSCULAR | Status: AC
  Filled 2020-02-21: qty 2

## 2020-02-21 MED ORDER — PROPOFOL 10 MG/ML IV BOLUS
INTRAVENOUS | Status: DC | PRN
  Administered 2020-02-21: 60 mg via INTRAVENOUS

## 2020-02-21 MED ORDER — CHLORHEXIDINE GLUCONATE 0.12 % MT SOLN
OROMUCOSAL | Status: AC
  Administered 2020-02-21: 15 mL via OROMUCOSAL
  Filled 2020-02-21: qty 15

## 2020-02-21 MED ORDER — DEXAMETHASONE SODIUM PHOSPHATE 10 MG/ML IJ SOLN
INTRAMUSCULAR | Status: DC | PRN
  Administered 2020-02-21: 5 mg via INTRAVENOUS

## 2020-02-21 MED ORDER — LACTATED RINGERS IV SOLN: INTRAVENOUS | Status: DC | PRN

## 2020-02-21 MED ORDER — PROPOFOL 10 MG/ML IV BOLUS
INTRAVENOUS | Status: AC
  Filled 2020-02-21: qty 40

## 2020-02-21 MED ORDER — ACETAMINOPHEN 500 MG PO TABS
ORAL_TABLET | ORAL | Status: AC
  Administered 2020-02-21: 1000 mg via ORAL
  Filled 2020-02-21: qty 2

## 2020-02-21 MED ORDER — CHLORHEXIDINE GLUCONATE 0.12 % MT SOLN: 15.0000 mL | Freq: Once | OROMUCOSAL | Status: AC

## 2020-02-21 MED ORDER — CEFAZOLIN SODIUM-DEXTROSE 2-4 GM/100ML-% IV SOLN
INTRAVENOUS | Status: AC
  Filled 2020-02-21: qty 100

## 2020-02-21 MED ORDER — PHENYLEPHRINE HCL-NACL 10-0.9 MG/250ML-% IV SOLN
INTRAVENOUS | Status: DC | PRN
  Administered 2020-02-21: 25 ug/min via INTRAVENOUS

## 2020-02-21 MED ORDER — DEXAMETHASONE SODIUM PHOSPHATE 10 MG/ML IJ SOLN
INTRAMUSCULAR | Status: AC
  Filled 2020-02-21: qty 1

## 2020-02-21 MED ORDER — CEFAZOLIN SODIUM-DEXTROSE 2-4 GM/100ML-% IV SOLN
2.0000 g | INTRAVENOUS | Status: AC
  Administered 2020-02-21: 2 g via INTRAVENOUS

## 2020-02-21 MED ORDER — BUPIVACAINE HCL (PF) 0.25 % IJ SOLN
INTRAMUSCULAR | Status: AC
  Filled 2020-02-21: qty 30

## 2020-02-21 MED ORDER — FENTANYL CITRATE (PF) 250 MCG/5ML IJ SOLN
INTRAMUSCULAR | Status: AC
  Filled 2020-02-21: qty 5

## 2020-02-21 MED ORDER — CHLORHEXIDINE GLUCONATE CLOTH 2 % EX PADS: 6.0000 | MEDICATED_PAD | Freq: Once | CUTANEOUS | Status: DC

## 2020-02-21 MED ORDER — ROCURONIUM BROMIDE 10 MG/ML (PF) SYRINGE
PREFILLED_SYRINGE | INTRAVENOUS | Status: DC | PRN
  Administered 2020-02-21: 60 mg via INTRAVENOUS

## 2020-02-21 MED ORDER — OXYCODONE HCL 5 MG PO TABS
5.0000 mg | ORAL_TABLET | Freq: Once | ORAL | Status: AC
  Administered 2020-02-21: 5 mg via ORAL

## 2020-02-21 MED ORDER — ORAL CARE MOUTH RINSE: 15.0000 mL | Freq: Once | OROMUCOSAL | Status: AC

## 2020-02-21 MED ORDER — ONDANSETRON HCL 4 MG/2ML IJ SOLN
INTRAMUSCULAR | Status: DC | PRN
  Administered 2020-02-21: 4 mg via INTRAVENOUS

## 2020-02-21 SURGICAL SUPPLY — 42 items
CANISTER SUCT 3000ML PPV (MISCELLANEOUS) IMPLANT
COVER SURGICAL LIGHT HANDLE (MISCELLANEOUS) ×3 IMPLANT
COVER WAND RF STERILE (DRAPES) ×3 IMPLANT
DEFOGGER SCOPE WARMER CLEARIFY (MISCELLANEOUS) IMPLANT
DERMABOND ADVANCED (GAUZE/BANDAGES/DRESSINGS) ×2
DERMABOND ADVANCED .7 DNX12 (GAUZE/BANDAGES/DRESSINGS) ×1 IMPLANT
DISSECTOR BLUNT TIP ENDO 5MM (MISCELLANEOUS) IMPLANT
ELECT REM PT RETURN 9FT ADLT (ELECTROSURGICAL) ×3
ELECTRODE REM PT RTRN 9FT ADLT (ELECTROSURGICAL) ×1 IMPLANT
ENDOLOOP SUT PDS II  0 18 (SUTURE) ×3
ENDOLOOP SUT PDS II 0 18 (SUTURE) ×1 IMPLANT
GLOVE BIO SURGEON STRL SZ7.5 (GLOVE) ×3 IMPLANT
GOWN STRL REUS W/ TWL LRG LVL3 (GOWN DISPOSABLE) ×2 IMPLANT
GOWN STRL REUS W/ TWL XL LVL3 (GOWN DISPOSABLE) ×1 IMPLANT
GOWN STRL REUS W/TWL LRG LVL3 (GOWN DISPOSABLE) ×6
GOWN STRL REUS W/TWL XL LVL3 (GOWN DISPOSABLE) ×3
KIT BASIN OR (CUSTOM PROCEDURE TRAY) ×3 IMPLANT
KIT TURNOVER KIT B (KITS) ×3 IMPLANT
MESH PROGRIP LAP SELF FIXATING (Mesh General) ×3 IMPLANT
MESH PROGRIP LAP SLF FIX 16X12 (Mesh General) ×1 IMPLANT
NEEDLE INSUFFLATION 14GA 120MM (NEEDLE) IMPLANT
NS IRRIG 1000ML POUR BTL (IV SOLUTION) ×3 IMPLANT
PAD ARMBOARD 7.5X6 YLW CONV (MISCELLANEOUS) ×6 IMPLANT
RELOAD STAPLE HERNIA 4.0 BLUE (INSTRUMENTS) ×3 IMPLANT
RELOAD STAPLE HERNIA 4.8 BLK (STAPLE) IMPLANT
SCISSORS LAP 5X35 DISP (ENDOMECHANICALS) ×3 IMPLANT
SET IRRIG TUBING LAPAROSCOPIC (IRRIGATION / IRRIGATOR) IMPLANT
SET TUBE SMOKE EVAC HIGH FLOW (TUBING) ×3 IMPLANT
STAPLER HERNIA 12 8.5 360D (INSTRUMENTS) ×3 IMPLANT
SUT MNCRL AB 4-0 PS2 18 (SUTURE) ×3 IMPLANT
SUT VIC AB 1 CT1 27 (SUTURE)
SUT VIC AB 1 CT1 27XBRD ANBCTR (SUTURE) IMPLANT
SYRINGE TOOMEY DISP (SYRINGE) ×3 IMPLANT
TOWEL GREEN STERILE (TOWEL DISPOSABLE) ×3 IMPLANT
TOWEL GREEN STERILE FF (TOWEL DISPOSABLE) ×3 IMPLANT
TRAY FOLEY W/BAG SLVR 14FR (SET/KITS/TRAYS/PACK) ×3 IMPLANT
TRAY LAPAROSCOPIC MC (CUSTOM PROCEDURE TRAY) ×3 IMPLANT
TROCAR OPTICAL SHORT 5MM (TROCAR) ×3 IMPLANT
TROCAR OPTICAL SLV SHORT 5MM (TROCAR) ×3 IMPLANT
TROCAR XCEL 12X100 BLDLESS (ENDOMECHANICALS) ×3 IMPLANT
WARMER LAPAROSCOPE (MISCELLANEOUS) ×3 IMPLANT
WATER STERILE IRR 1000ML POUR (IV SOLUTION) ×3 IMPLANT

## 2020-02-21 NOTE — Transfer of Care (Signed)
Immediate Anesthesia Transfer of Care Note  Patient: Brian Brock  Procedure(s) Performed: LAPAROSCOPIC RIGHT INGUINAL HERNIA REPAIR WITH MESH (Right Abdomen)  Patient Location: PACU  Anesthesia Type:General  Level of Consciousness: awake, alert  and responds to stimulation  Airway & Oxygen Therapy: Patient Spontanous Breathing  Post-op Assessment: Report given to RN and Post -op Vital signs reviewed and stable  Post vital signs: Reviewed and stable  Last Vitals:  Vitals Value Taken Time  BP 156/68 02/21/20 1000  Temp 36.2 C 02/21/20 1000  Pulse 56 02/21/20 1002  Resp 10 02/21/20 1002  SpO2 98 % 02/21/20 1002  Vitals shown include unvalidated device data.  Last Pain:  Vitals:   02/21/20 1000  TempSrc:   PainSc: 0-No pain         Complications: No complications documented.

## 2020-02-21 NOTE — H&P (Signed)
History of Present Illness  The patient is a 74 year old male who presents with an inguinal hernia. Patient is a 74 year old male comes back in secondary to recurrent right inguinal hernia. Patient had a previous open repair of a large right inguinal hernia that subsequently had a femoral hernia component. Patient states that over the last year he states that bulge return. He states he does have some pain to the right inguinal area. States he's had the ability to reduce the hernia when he lays down. He does state that bit larger.  Patient had a recent CT scan in February which I reviewed personally. CT scan does show that he has a recurrent right hernia  Patient recently saw Dr. Nadyne Coombes, he is currently on Plavix and Xarelto for his cardiac CAD.  ----------------------------------------- Elenore Rota presents for follow for recurrent right inguinal hernia. He initially had a open repair of large right inguinal hernia with mesh by Dr. Dalbert Batman in August 2018. He developed a small recurrence and I saw him for that but he wasn't having a lot of symptoms. He returns now as he started to have some more pain both directly at the site of the small bulge as well as in his right testicle. His right testicle does not swell. The hernia spontaneously reduces. Of note, he is undergoing a cardiac and sleep study workup by Dr. Einar Gip to see why he has decreased energy.   Past Surgical History  Bypass Surgery for Poor Blood Flow to Legs  Carotid Artery Surgery  Bilateral. Cataract Surgery  Bilateral. Colon Polyp Removal - Colonoscopy  Gallbladder Surgery - Open  Open Inguinal Hernia Surgery  Right. Oral Surgery  Shoulder Surgery  Left.  Allergies SulfADIAZINE *Sulfonamides*  Metoprolol Tartrate *BETA BLOCKERS*  LevoFLOXacin *FLUOROQUINOLONES*  Meloxicam *ANALGESICS - ANTI-INFLAMMATORY*  Niaspan *ANTIHYPERLIPIDEMICS*  Tricor *ANTIHYPERLIPIDEMICS*  Metaxalone *MUSCULOSKELETAL THERAPY  AGENTS*  Fish Oil *NUTRIENTS*  Allergies Reconciled   Medication History Zinc (50MG  Tablet, Oral) Active. Zolpidem Tartrate (10MG  Tablet, Oral) Active. Xarelto (20MG  Tablet, Oral) Active. Cozaar (100MG  Tablet, Oral) Active. Plavix (75MG  Tablet, Oral) Active. Proscar (5MG  Tablet, Oral) Active. Vitamin C (Oral) Specific strength unknown - Active. Aspirin (81MG  Tablet, Oral) Active. ZyrTEC (Oral) Specific strength unknown - Active. Medications Reconciled  Social History Alcohol use  Occasional alcohol use. Caffeine use  Carbonated beverages, Tea, Coffee. No drug use  Tobacco use  Current some day smoker, Former smoker.  Family History Alcohol Abuse  Father. Cancer  Brother, Father. Heart Disease  Brother, Father. Respiratory Condition  Father, Mother.  Other Problems Arthritis  Asthma  Atrial Fibrillation  Back Pain  Chronic Obstructive Lung Disease  Emphysema Of Lung  Gastroesophageal Reflux Disease  Inguinal Hernia  Myocardial infarction  Pulmonary Embolism / Blood Clot in Legs  Vascular Disease     Review of Systems General Not Present- Appetite Loss, Chills, Fatigue, Fever, Night Sweats, Weight Gain and Weight Loss. Skin Not Present- Change in Wart/Mole, Dryness, Hives, Jaundice, New Lesions, Non-Healing Wounds, Rash and Ulcer. HEENT Present- Ringing in the Ears and Seasonal Allergies. Not Present- Earache, Hearing Loss, Hoarseness, Nose Bleed, Oral Ulcers, Sinus Pain, Sore Throat, Visual Disturbances, Wears glasses/contact lenses and Yellow Eyes. Respiratory Present- Snoring and Wheezing. Not Present- Bloody sputum, Chronic Cough and Difficulty Breathing. Cardiovascular Not Present- Chest Pain, Difficulty Breathing Lying Down, Leg Cramps, Palpitations, Rapid Heart Rate, Shortness of Breath and Swelling of Extremities. Gastrointestinal Present- Change in Bowel Habits and Hemorrhoids. Not Present- Abdominal Pain, Bloating, Bloody  Stool, Chronic diarrhea, Constipation,  Difficulty Swallowing, Excessive gas, Gets full quickly at meals, Indigestion, Nausea, Rectal Pain and Vomiting. Male Genitourinary Present- Impotence and Urine Leakage. Not Present- Blood in Urine, Change in Urinary Stream, Frequency, Nocturia, Painful Urination and Urgency.  BP (!) 129/54   Pulse (!) 50   Temp 98.1 F (36.7 C) (Oral)   Resp 17   Ht 5\' 9"  (1.753 m)   Wt 84.7 kg   SpO2 98%   BMI 27.59 kg/m   Physical Exam  General Note: No distress   Integumentary Note: Warm and dry   Chest and Lung Exam Note: Clear to auscultation bilaterally   Cardiovascular Note: Regular rate and rhythm, no significant peripheral edema   Abdomen Note: Soft, nontender, no HSM   Male Genitourinary Note: Recurrent right inguinal hernia towards the mid to lateral portion of his scar. Easily reduces. Does not extend into the scrotum. No testicular edema. No left inguinal hernia   Musculoskeletal Note: No deformity or tenderness     Assessment & Plan  RECURRENT RIGHT INGUINAL HERNIA (K40.91) Impression: Patient is a 74 year old male who comes in secondary to her recurrent right inguinal hernia. Patient sees Dr. Nadyne Coombes his cardiologist. We will send for direction to have his Xarelto on Plavix held prior to surgery.  1. The patient will like to proceed to the operating room for laparoscopic right inguinal hernia repair with mesh.  2. I discussed with the patient the signs and symptoms of incarceration and strangulation and the need to proceed to the ER should they occur.  3. I discussed with the patient the risks and benefits of the procedure to include but not limited to: Infection, bleeding, damage to surrounding structures, possible need for further surgery, possible nerve pain, and possible recurrence. The patient was understanding and wishes to proceed.

## 2020-02-21 NOTE — Progress Notes (Signed)
I put order in for oxygen for him.

## 2020-02-21 NOTE — Op Note (Signed)
02/21/2020  9:49 AM  PATIENT:  Brian Brock  74 y.o. male  PRE-OPERATIVE DIAGNOSIS:  RECURRENT RIGHT INGUINAL HERNIA  POST-OPERATIVE DIAGNOSIS:  RECURRENT RIGHT INDIRECT INGUINAL HERNIA  PROCEDURE:  Procedure(s): LAPAROSCOPIC RIGHT INGUINAL HERNIA REPAIR WITH MESH (Right) MESH PLUG REMOVAL  SURGEON:  Surgeon(s) and Role:    * Ralene Ok, MD - Primary  ANESTHESIA:   local and general  EBL:  minimal   BLOOD ADMINISTERED:none  DRAINS: none   LOCAL MEDICATIONS USED:  BUPIVICAINE   SPECIMEN:  No Specimen  DISPOSITION OF SPECIMEN:  N/A  COUNTS:  YES  TOURNIQUET:  * No tourniquets in log *  DICTATION: .Dragon Dictation  Counts: reported as correct x 2  Findings:  The patient had a recurrent right indirect hernia and a mesh plug that was removed.  Indications for procedure:  The patient is a 74 year old male with a recurrent right inguinal hernia for several months. Patient complained of symptomatology to his right inguinal area. The patient was taken back for elective inguinal hernia repair.  Details of the procedure: The patient was taken back to the operating room. The patient was placed in supine position with bilateral SCDs in place.  The patient was prepped and draped in the usual sterile fashion.  After appropriate anitbiotics were confirmed, a time-out was confirmed and all facts were verified.  0.25% Marcaine was used to infiltrate the umbilical area. A 11-blade was used to cut down the skin and blunt dissection was used to get the anterior fashion.  The anterior fascia was incised approximately 1 cm and the muscles were retracted laterally. Blunt dissection was then used to create a space in the preperitoneal area. At this time a 10 mm camera was then introduced into the space and advanced the pubic tubercle and a 12 mm trocar was placed over this and insufflation was started.  At this time and space was created from medial to laterally the preperitoneal  space.  Cooper's ligament was initially cleaned off.  The hernia sac was identified in the indirect space. Dissection of the hernia sac and cord structures was undertaken the vas deferens was identified and protected in all parts of the case.  There was a small tear into the hernia sac. The tear was closed using a 0 Endoloop x 1.  There was also a large mesh plug that was seen at the internal ring.  This was dissected away slowly from the cord structures and removed.  Once the hernia sac was taken down to approximately the umbilicus a laparoscopic Progrip 16x12cm mesh, was  introduced into the preperitoneal space.  The mesh was brought over to cover the direct and indirect hernia spaces.  This was anchored into place and secured to Cooper's ligament with 4.78mm staples from a Coviden hernia stapler. It was anchored to the anterior abdominal wall with 4.8 mm staples. The hernia sac was seen lying posterior to the mesh. There was no staples placed laterally. The insufflation was evacuated and the peritoneum was seen posterior to the mesh. The trochars were removed. The anterior fascia was reapproximated using #1 Vicryl on a UR- 6.  Intra-abdominal air was evacuated and the Veress needle removed. The skin was reapproximated using 4-0 Monocryl subcuticular fashion and Dermabond. The patient was awakened from general anesthesia and taken to recovery in stable condition.   PLAN OF CARE: Discharge to home after PACU  PATIENT DISPOSITION:  PACU - hemodynamically stable.   Delay start of Pharmacological VTE agent (>24hrs) due  to surgical blood loss or risk of bleeding: not applicable

## 2020-02-21 NOTE — Progress Notes (Signed)
Reviewed results of patient's nocturnal pulse oximetry, revealed patient's oxygen saturations remained below 80% for greater than 5 minutes while sleeping.  He would benefit from supplemental oxygen therapy during sleep.  Have sent order for 2L/min supplemental O2 at night while sleeping.

## 2020-02-21 NOTE — Anesthesia Procedure Notes (Signed)
Procedure Name: Intubation Date/Time: 02/21/2020 8:33 AM Performed by: Glynda Jaeger, CRNA Pre-anesthesia Checklist: Patient identified, Patient being monitored, Timeout performed, Emergency Drugs available and Suction available Patient Re-evaluated:Patient Re-evaluated prior to induction Oxygen Delivery Method: Circle System Utilized Preoxygenation: Pre-oxygenation with 100% oxygen Induction Type: IV induction Ventilation: Mask ventilation without difficulty and Oral airway inserted - appropriate to patient size Laryngoscope Size: Mac and 4 Grade View: Grade I Tube type: Oral Tube size: 7.5 mm Number of attempts: 1 Airway Equipment and Method: Stylet Placement Confirmation: ETT inserted through vocal cords under direct vision,  positive ETCO2 and breath sounds checked- equal and bilateral Secured at: 23 cm Tube secured with: Tape Dental Injury: Teeth and Oropharynx as per pre-operative assessment

## 2020-02-21 NOTE — Discharge Instructions (Signed)
[facility] _______[facility] °INGUINAL HERNIA REPAIR: POST OP INSTRUCTIONS ° °Always review your discharge instruction sheet given to you by the facility where your surgery was performed. °IF YOU HAVE DISABILITY OR FAMILY LEAVE FORMS, YOU MUST BRING THEM TO THE OFFICE FOR PROCESSING.   °DO NOT GIVE THEM TO YOUR DOCTOR. ° °1. A  prescription for pain medication may be given to you upon discharge.  Take your pain medication as prescribed, if needed.  If narcotic pain medicine is not needed, then you may take acetaminophen (Tylenol) or ibuprofen (Advil) as needed. °2. Take your usually prescribed medications unless otherwise directed. °If you need a refill on your pain medication, please contact your pharmacy.  They will contact our office to request authorization. Prescriptions will not be filled after 5 pm or on week-ends. °3. You should follow a light diet the first 24 hours after arrival home, such as soup and crackers, etc.  Be sure to include lots of fluids daily.  Resume your normal diet the day after surgery. °4.Most patients will experience some swelling and bruising around the umbilicus or in the groin and scrotum.  Ice packs and reclining will help.  Swelling and bruising can take several days to resolve.  °6. It is common to experience some constipation if taking pain medication after surgery.  Increasing fluid intake and taking a stool softener (such as Colace) will usually help or prevent this problem from occurring.  A mild laxative (Milk of Magnesia or Miralax) should be taken according to package directions if there are no bowel movements after 48 hours. °7. Unless discharge instructions indicate otherwise, you may remove your bandages 24-48 hours after surgery, and you may shower at that time.  You may have steri-strips (small skin tapes) in place directly over the incision.  These strips should be left on the skin for 7-10 days.  If your surgeon used skin glue on the incision, you may  shower in 24 hours.  The glue will flake off over the next 2-3 weeks.  Any sutures or staples will be removed at the office during your follow-up visit. °8. ACTIVITIES:  You may resume regular (light) daily activities beginning the next day--such as daily self-care, walking, climbing stairs--gradually increasing activities as tolerated.  You may have sexual intercourse when it is comfortable.  Refrain from any heavy lifting or straining until approved by your doctor. ° °a.You may drive when you are no longer taking prescription pain medication, you can comfortably wear a seatbelt, and you can safely maneuver your car and apply brakes. °b.RETURN TO WORK:   °_____________________________________________ ° °9.You should see your doctor in the office for a follow-up appointment approximately 2-3 weeks after your surgery.  Make sure that you call for this appointment within a day or two after you arrive home to insure a convenient appointment time. °10.OTHER INSTRUCTIONS: _________________________ °   _____________________________________ ° °WHEN TO CALL YOUR DOCTOR: °1. Fever over 101.0 °2. Inability to urinate °3. Nausea and/or vomiting °4. Extreme swelling or bruising °5. Continued bleeding from incision. °6. Increased pain, redness, or drainage from the incision ° °The clinic staff is available to answer your questions during regular business hours.  Please don’t hesitate to call and ask to speak to one of the nurses for clinical concerns.  If you have a medical emergency, go to the nearest emergency room or call 911.  A surgeon from [facility] is always on call at the hospital ° ° °[address], [address], [place], [place]  [address] ? ° [address] [address], [place], [place]   [address] °[phone] ? [phone] ? FAX [phone] °Web site: [email or web address] ° °

## 2020-02-22 ENCOUNTER — Encounter (HOSPITAL_COMMUNITY): Payer: Self-pay | Admitting: General Surgery

## 2020-02-22 NOTE — Anesthesia Postprocedure Evaluation (Signed)
Anesthesia Post Note  Patient: Brian Brock  Procedure(s) Performed: LAPAROSCOPIC RIGHT INGUINAL HERNIA REPAIR WITH MESH (Right Abdomen)     Patient location during evaluation: PACU Anesthesia Type: General Level of consciousness: awake and alert Pain management: pain level controlled Vital Signs Assessment: post-procedure vital signs reviewed and stable Respiratory status: spontaneous breathing, nonlabored ventilation and respiratory function stable Cardiovascular status: blood pressure returned to baseline and stable Postop Assessment: no apparent nausea or vomiting Anesthetic complications: no   No complications documented.  Last Vitals:  Vitals:   02/21/20 1030 02/21/20 1045  BP: 127/81 (!) 165/62  Pulse: (!) 48 (!) 55  Resp: 13 19  Temp:    SpO2: 98% 95%    Last Pain:  Vitals:   02/21/20 1030  TempSrc:   PainSc: (P) 4                  Catalina Gravel

## 2020-02-22 NOTE — Progress Notes (Signed)
Message sent to [facility] for o2

## 2020-03-11 ENCOUNTER — Ambulatory Visit (INDEPENDENT_AMBULATORY_CARE_PROVIDER_SITE_OTHER): Payer: Medicare Other

## 2020-03-11 ENCOUNTER — Ambulatory Visit (INDEPENDENT_AMBULATORY_CARE_PROVIDER_SITE_OTHER): Payer: Medicare Other | Admitting: Internal Medicine

## 2020-03-11 ENCOUNTER — Other Ambulatory Visit: Payer: Self-pay

## 2020-03-11 ENCOUNTER — Encounter: Payer: Self-pay | Admitting: Internal Medicine

## 2020-03-11 VITALS — BP 122/76 | HR 66 | Temp 97.5°F | Ht 69.0 in | Wt 186.0 lb

## 2020-03-11 DIAGNOSIS — I2510 Atherosclerotic heart disease of native coronary artery without angina pectoris: Secondary | ICD-10-CM | POA: Diagnosis not present

## 2020-03-11 DIAGNOSIS — Z720 Tobacco use: Secondary | ICD-10-CM

## 2020-03-11 DIAGNOSIS — J302 Other seasonal allergic rhinitis: Secondary | ICD-10-CM

## 2020-03-11 DIAGNOSIS — J449 Chronic obstructive pulmonary disease, unspecified: Secondary | ICD-10-CM | POA: Diagnosis not present

## 2020-03-11 DIAGNOSIS — J3089 Other allergic rhinitis: Secondary | ICD-10-CM

## 2020-03-11 DIAGNOSIS — Z23 Encounter for immunization: Secondary | ICD-10-CM

## 2020-03-11 DIAGNOSIS — G4734 Idiopathic sleep related nonobstructive alveolar hypoventilation: Secondary | ICD-10-CM

## 2020-03-11 MED ORDER — BREZTRI AEROSPHERE 160-9-4.8 MCG/ACT IN AERO: 2.0000 | INHALATION_SPRAY | Freq: Two times a day (BID) | RESPIRATORY_TRACT | 0 refills | Status: DC

## 2020-03-11 NOTE — Patient Instructions (Signed)
Order- CXR   Dx COPD mixed type,  Post Covid  Order- Flu vax- senior  Sample X 2 Breztri inhaler     Inhale 2 puffs, then rinse mouth, twice daily   See if it help your breathing  Try using your Atrovent nasal spray again. If it doesn't help enough, try otc Flonase nasal spray- 2 puffs each nostril once daily at bedtime  Please call if we can help

## 2020-03-11 NOTE — Progress Notes (Signed)
HPI  M former Smoker (54 pk yrs) followed for  for asthma/ bronchitis complicated by Tobacco use, CAD/ CABG,  PAD/ Claudication, GERD, PE 05/24/2018, Nocturnal Hypoxemia, PAFib, GERD, Covid infection 9/ 2021,  PFT 09/08/12   ? 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 Xarelto. Managed by [facility]. -----pt states his breathing is at baseline, is not currently taking any breathing medications Every night as he lies down, he is aware of some chest congestion, can cough scant white. No blood. Discussed having a nebulizer machine. Discussed smoking cessation again.  CTa chest 05/24/2018- Small bilateral nonocclusive pulmonary emboli. Negative for evidence of right heart strain. Cardiomegaly.  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 Managed by [facility] infection 9//2021 Home O2 2L/ [employer]-    Started by Dr Einar Gip (Had Sleep Study- [facility]/  No OSA but desat to 67%, desat time 45 minutes dx Nocturnal Hypoxemia) Covid vax- Had AB infusion therapy Flu vax-  Today senior His Covid infection last year mostly just felt like sinus infection until after MAB infusion. Then got fever, myalgias, dyspnea.  Has not smoked now in 4 days- strongly encouraged to stay off. Some cough , clear phlegm. No blood, nodes or fever now. He will likely benefit from a nebulizer machine.  // Needs PFT//    ROS-see HPI + =  positive Constitutional:   No-   weight loss, night sweats, fevers, chills, fatigue, lassitude. HEENT:   No-  headaches, difficulty swallowing, tooth/dental problems, sore throat,       No-  sneezing, itching, ear ache, nasal congestion, post nasal drip,  CV:  No-   chest pain, orthopnea, PND, swelling in lower extremities, anasarca, dizziness, palpitations Resp: +-shortness of breath with exertion or at rest.              +   productive cough,  + non-productive cough,  No- coughing up of blood.              No-   change in color of mucus.  No- wheezing.   Skin: No-   rash or lesions. GI:  No-   heartburn, indigestion, abdominal pain, nausea, vomiting,  GU:  MS:  No-   joint pain or swelling.  . Neuro-     nothing unusual Psych:  No- change in mood or affect. No depression or anxiety.  No memory loss.  OBJ- Physical Exam General- Alert, Oriented, Affect-appropriate, Distress- none acute Skin- rash-none, lesions- none, excoriation- none Lymphadenopathy- none Head- atraumatic            Eyes- Gross vision intact, PERRLA, conjunctivae and secretions clear            Ears- Hearing, canals-normal            Nose- Clear, no-Septal dev, mucus, polyps, erosion, perforation             Throat- Mallampati II , mucosa clear , drainage- none, tonsils- atrophic, dentures Neck- flexible , trachea midline, no stridor , thyroid nl, carotid no bruit Chest - symmetrical  excursion , unlabored           Heart/CV- RRR occasional extra beat , no murmur , no gallop  , no rub, nl s1 s2                           - JVD- none , edema- none, stasis changes- none, varices- none           Lung- + wheeze+ bilateral coarse, cough- none , dullness-none, rub- none           Chest wall-  Abd-  Br/ Gen/ Rectal- Not done, not indicated Extrem- cyanosis- none, clubbing, none, atrophy- none, strength- nl Neuro- grossly intact to observation

## 2020-03-12 NOTE — Progress Notes (Signed)
Called and spoke with patient regarding cxr results per Dr. Annamaria Boots.  He verbalized understanding.  Nothing further needed.

## 2020-03-22 ENCOUNTER — Telehealth: Payer: Self-pay | Admitting: Internal Medicine

## 2020-03-22 NOTE — Telephone Encounter (Signed)
Spoke with Otila Kluver  She states needing pt's last ov notes faxed over  I have faxed them and nothing further needed

## 2020-03-26 ENCOUNTER — Encounter: Payer: Self-pay | Admitting: Internal Medicine

## 2020-03-26 ENCOUNTER — Telehealth: Payer: Self-pay | Admitting: Internal Medicine

## 2020-03-26 DIAGNOSIS — J3089 Other allergic rhinitis: Secondary | ICD-10-CM | POA: Insufficient documentation

## 2020-03-26 DIAGNOSIS — J302 Other seasonal allergic rhinitis: Secondary | ICD-10-CM | POA: Insufficient documentation

## 2020-03-26 DIAGNOSIS — G4734 Idiopathic sleep related nonobstructive alveolar hypoventilation: Secondary | ICD-10-CM | POA: Insufficient documentation

## 2020-03-26 NOTE — Assessment & Plan Note (Addendum)
On review I find no result for pft in 2014, so anticipate ordering in future. Not clear if he has residual from Covid infection 2020, but long-time smoker.  He needs a nebulizer machine for home use with albuterol. Plan- CXR, flu vax, sample trial [employer]

## 2020-03-26 NOTE — Assessment & Plan Note (Signed)
Strongly encouraged him to stay off, now that he has stopped again.

## 2020-03-26 NOTE — Assessment & Plan Note (Signed)
Lingering nasal congestion He has some nasal Atrovent and will try that. If needed, he can add Flonase

## 2020-03-26 NOTE — Telephone Encounter (Signed)
[facility] is asking if 10/25 office visit notes can be addended to show that patient needs albuterol nebulizer.  We will need to fax updated notes to [facility] at [phone].    CY please advise.  Thanks!

## 2020-03-26 NOTE — Assessment & Plan Note (Signed)
O2 2L sleep/ [employer]- to continue

## 2020-03-26 NOTE — Telephone Encounter (Signed)
Note is completed as requested

## 2020-03-26 NOTE — Telephone Encounter (Signed)
Letter printed and faxed to [facility] as requested.  Nothing further needed at this time- will close encounter.

## 2020-06-03 ENCOUNTER — Ambulatory Visit: Payer: Medicare Other | Admitting: Cardiology

## 2020-06-13 ENCOUNTER — Ambulatory Visit: Payer: Medicare Other | Admitting: Family

## 2020-06-13 ENCOUNTER — Other Ambulatory Visit: Payer: Medicare Other

## 2020-06-17 ENCOUNTER — Encounter: Payer: Self-pay | Admitting: Family

## 2020-06-17 ENCOUNTER — Inpatient Hospital Stay: Payer: Medicare Other | Attending: Hematology & Oncology

## 2020-06-17 ENCOUNTER — Inpatient Hospital Stay (HOSPITAL_BASED_OUTPATIENT_CLINIC_OR_DEPARTMENT_OTHER): Payer: Medicare Other | Admitting: Family

## 2020-06-17 ENCOUNTER — Other Ambulatory Visit: Payer: Self-pay

## 2020-06-17 VITALS — BP 144/60 | HR 53 | Temp 97.7°F | Resp 18 | Ht 68.0 in | Wt 187.0 lb

## 2020-06-17 DIAGNOSIS — Z86718 Personal history of other venous thrombosis and embolism: Secondary | ICD-10-CM | POA: Insufficient documentation

## 2020-06-17 DIAGNOSIS — R079 Chest pain, unspecified: Secondary | ICD-10-CM

## 2020-06-17 DIAGNOSIS — I824Y1 Acute embolism and thrombosis of unspecified deep veins of right proximal lower extremity: Secondary | ICD-10-CM

## 2020-06-17 DIAGNOSIS — I2699 Other pulmonary embolism without acute cor pulmonale: Secondary | ICD-10-CM | POA: Diagnosis not present

## 2020-06-17 DIAGNOSIS — Z7901 Long term (current) use of anticoagulants: Secondary | ICD-10-CM | POA: Diagnosis not present

## 2020-06-17 LAB — CBC WITH DIFFERENTIAL (CANCER CENTER ONLY)
Abs Immature Granulocytes: 0.02 10*3/uL (ref 0.00–0.07)
Basophils Absolute: 0.1 10*3/uL (ref 0.0–0.1)
Basophils Relative: 1 %
Eosinophils Absolute: 0.1 10*3/uL (ref 0.0–0.5)
Eosinophils Relative: 1 %
HCT: 46.6 % (ref 39.0–52.0)
Hemoglobin: 15.3 g/dL (ref 13.0–17.0)
Immature Granulocytes: 0 %
Lymphocytes Relative: 31 %
Lymphs Abs: 2.9 10*3/uL (ref 0.7–4.0)
MCH: 28.4 pg (ref 26.0–34.0)
MCHC: 32.8 g/dL (ref 30.0–36.0)
MCV: 86.6 fL (ref 80.0–100.0)
Monocytes Absolute: 0.9 10*3/uL (ref 0.1–1.0)
Monocytes Relative: 10 %
Neutro Abs: 5.3 10*3/uL (ref 1.7–7.7)
Neutrophils Relative %: 57 %
Platelet Count: 197 10*3/uL (ref 150–400)
RBC: 5.38 MIL/uL (ref 4.22–5.81)
RDW: 13.2 % (ref 11.5–15.5)
WBC Count: 9.3 10*3/uL (ref 4.0–10.5)
nRBC: 0 % (ref 0.0–0.2)

## 2020-06-17 LAB — CMP (CANCER CENTER ONLY)
ALT: 16 U/L (ref 0–44)
AST: 21 U/L (ref 15–41)
Albumin: 4.4 g/dL (ref 3.5–5.0)
Alkaline Phosphatase: 80 U/L (ref 38–126)
Anion gap: 7 (ref 5–15)
BUN: 22 mg/dL (ref 8–23)
CO2: 27 mmol/L (ref 22–32)
Calcium: 9.9 mg/dL (ref 8.9–10.3)
Chloride: 106 mmol/L (ref 98–111)
Creatinine: 1.2 mg/dL (ref 0.61–1.24)
GFR, Estimated: >60 mL/min (ref >60)
Glucose, Bld: 107 mg/dL — ABNORMAL HIGH (ref 70–99)
Potassium: 4.3 mmol/L (ref 3.5–5.1)
Sodium: 140 mmol/L (ref 135–145)
Total Bilirubin: 0.5 mg/dL (ref 0.3–1.2)
Total Protein: 6.9 g/dL (ref 6.5–8.1)

## 2020-06-17 NOTE — Progress Notes (Signed)
Hematology and Oncology Follow Up Visit  Brian Brock [id number] 09-14-1945 75 y.o. 06/17/2020   Principle Diagnosis:  Unprovoked RLE DVT and bilateral PTE  ? Extensive acute DVT involving the R femoral, popliteal, posterior tibial and peroneal veinon doppler ? Small bilateral PTE without evidence of RV strainon CTA chest; no malignancy on scans   Current Therapy:        Xarelto20mg  daily- 05/24/2018 - present, lifelong   Interim History:  Brian Brock is here today for follow-up. He is doing well on Xarelto and has no complaints at this time.  He is now on 2L [place] supplemental O2 at night. He notes occasional nose bleeds that resolve quickly on their own without intervention.  He states that his right leg is always a little bigger than his left leg but no swelling noted. No redness or edema. Pedal pulses are 2+.  No fever, chills, n/v, cough, rash, dizziness, SOB, chest pain, palpitations, abdominal pain or changes in bowel or bladder habits.  He typically has constipation but has had diarrhea today due to foods that he had for dinner last night.  He had Covid back in September and states that he received an immunoglobulin infusion.  No tenderness, numbness or tingling in his extremities.  No falls or syncope to report.  He has maintained a good appetite and is staying well hydrated. His weight is stable.   ECOG Performance Status: 0 - Asymptomatic  Medications:  Allergies as of 06/17/2020      Reactions   Other Anaphylaxis, Shortness Of Breath, Swelling   [place] nuts- "will kill me"   Clarithromycin Nausea Only   Fenofibrate Micronized Other (See Comments)   Caused weakness/lethargy   Metoprolol Other (See Comments)   Fatigue and malaise   Tricor [fenofibrate] Other (See Comments)   Tired/weak   Fish Oil Rash   Levofloxacin Other (See Comments)   Doesn't remember   Meloxicam Other (See Comments)   Achy, fatigue   Metaxalone Other (See Comments)   Doesn't remember    Niaspan [niacin] Other (See Comments)   Doesn't remember   Sulfadiazine Hives, Rash      Medication List       Accurate as of June 17, 2020  3:27 PM. If you have any questions, ask your nurse or doctor.        STOP taking these medications   ipratropium 0.03 % nasal spray Commonly known as: ATROVENT Stopped by: Laverna Peace, NP   multivitamin with minerals Tabs tablet Stopped by: Laverna Peace, NP     TAKE these medications   acetaminophen 500 MG tablet Commonly known as: TYLENOL Take 500-1,000 mg by mouth every 6 (six) hours as needed for moderate pain.   aspirin EC 81 MG tablet Take 81 mg by mouth at bedtime.   Breztri Aerosphere 160-9-4.8 MCG/ACT Aero Generic drug: Budeson-Glycopyrrol-Formoterol Inhale 2 puffs into the lungs 2 (two) times daily.   Bystolic 5 MG tablet Generic drug: nebivolol TAKE 1/2 TABLET ONCE DAILY What changed: how much to take   cetirizine 10 MG tablet Commonly known as: ZYRTEC Take 10 mg by mouth daily.   cholestyramine light 4 g packet Commonly known as: PREVALITE Take 4 g by mouth every evening.   cyclobenzaprine 10 MG tablet Commonly known as: FLEXERIL Take 10 mg by mouth 3 (three) times daily as needed for muscle spasms.   levothyroxine 25 MCG tablet Commonly known as: SYNTHROID Take 25 mcg by mouth daily before breakfast.   loperamide 2  MG tablet Commonly known as: IMODIUM A-D Take 2 mg by mouth 4 (four) times daily as needed for diarrhea or loose stools.   losartan 100 MG tablet Commonly known as: COZAAR Take 100 mg by mouth daily.   Melatonin 12 MG Tabs Take 12 mg by mouth at bedtime.   nitroGLYCERIN 0.4 MG SL tablet Commonly known as: NITROSTAT PLACE 1 TABLET UNDER THE TONGUE, MAY REPEAT EVERY 5 MINUTES MAX 3 TABLETS   pantoprazole 40 MG tablet Commonly known as: PROTONIX Take 1 tablet (40 mg total) by mouth 2 (two) times daily. What changed: when to take this   PROBIOTIC PO Take 1 capsule by  mouth daily.   rivaroxaban 20 MG Tabs tablet Commonly known as: XARELTO Take 1 tablet (20 mg total) by mouth daily with supper.   rosuvastatin 20 MG tablet Commonly known as: CRESTOR TAKE ONE TABLET BY MOUTH EVERY DAY What changed: how much to take   vitamin C 1000 MG tablet Take 1,000 mg by mouth in the morning and at bedtime.   Vitamin D3 50 MCG (2000 UT) Tabs Take 2,000 Units by mouth daily.   Zinc 50 MG Tabs Take 50 mg by mouth in the morning and at bedtime.   zolpidem 10 MG tablet Commonly known as: AMBIEN Take 10 mg by mouth at bedtime as needed for sleep.       Allergies:  Allergies  Allergen Reactions  . Other Anaphylaxis, Shortness Of Breath and Swelling    [place] nuts- "will kill me"  . Clarithromycin Nausea Only  . Fenofibrate Micronized Other (See Comments)    Caused weakness/lethargy  . Metoprolol Other (See Comments)    Fatigue and malaise  . Tricor [Fenofibrate] Other (See Comments)    Tired/weak  . Fish Oil Rash  . Levofloxacin Other (See Comments)    Doesn't remember  . Meloxicam Other (See Comments)    Achy, fatigue  . Metaxalone Other (See Comments)    Doesn't remember  . Niaspan [Niacin] Other (See Comments)    Doesn't remember  . Sulfadiazine Hives and Rash    Past Medical History, Surgical history, Social history, and Family History were reviewed and updated.  Review of Systems: All other 10 point review of systems is negative.   Physical Exam:  height is 5\' 8"  (1.727 m) and weight is 187 lb (84.8 kg). His oral temperature is 97.7 F (36.5 C). His blood pressure is 144/60 (abnormal) and his pulse is 53 (abnormal). His respiration is 18 and oxygen saturation is 98%.   Wt Readings from Last 3 Encounters:  06/17/20 187 lb (84.8 kg)  03/11/20 186 lb (84.4 kg)  02/21/20 186 lb 12.8 oz (84.7 kg)    Ocular: Sclerae unicteric, pupils equal, round and reactive to light Ear-nose-throat: Oropharynx clear, dentition fair Lymphatic: No  cervical or supraclavicular adenopathy Lungs no rales or rhonchi, good excursion bilaterally Heart regular rate and rhythm, no murmur appreciated Abd soft, nontender, positive bowel sounds MSK no focal spinal tenderness, no joint edema Neuro: non-focal, well-oriented, appropriate affect Breasts: Deferred   Lab Results  Component Value Date   WBC 9.3 06/17/2020   HGB 15.3 06/17/2020   HCT 46.6 06/17/2020   MCV 86.6 06/17/2020   PLT 197 06/17/2020   Lab Results  Component Value Date   FERRITIN 135.1 11/03/2017   IRON 102 11/03/2017   IRONPCTSAT 27.5 11/03/2017   Lab Results  Component Value Date   RBC 5.38 06/17/2020   No results found for: KPAFRELGTCHN,  LAMBDASER, KAPLAMBRATIO No results found for: IGGSERUM, IGA, IGMSERUM No results found for: Odetta Pink, SPEI   Chemistry      Component Value Date/Time   NA 140 06/17/2020 1432   NA 143 08/31/2019 1436   K 4.3 06/17/2020 1432   CL 106 06/17/2020 1432   CO2 27 06/17/2020 1432   BUN 22 06/17/2020 1432   BUN 21 08/31/2019 1436   CREATININE 1.20 06/17/2020 1432   CREATININE 1.05 02/12/2016 1008      Component Value Date/Time   CALCIUM 9.9 06/17/2020 1432   ALKPHOS 80 06/17/2020 1432   AST 21 06/17/2020 1432   ALT 16 06/17/2020 1432   BILITOT 0.5 06/17/2020 1432       Impression and Plan: Mr. 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  He continues to do well on Xarelto and so far there has been no evidence of recurrence.  We will repeat CT angio and US of the left leg in 6 months with follow-up.  He was encouraged to contact our office with any questions or concerns. We can certainly see him sooner if needed.   Laverna Peace, NP 1/31/20223:27 PM

## 2020-06-18 ENCOUNTER — Telehealth: Payer: Self-pay

## 2020-06-18 NOTE — Telephone Encounter (Signed)
Pt stated at checkout 06/17/20 that he would view next appts on my chart, we will call pt with scans once approved     Brian Brock

## 2020-07-04 NOTE — Progress Notes (Signed)
Primary Physician/Referring:  [name], MD  Patient ID: Brian Brock, male    DOB: 1945-07-05, 75 y.o.   MRN: [id number]  Chief Complaint  Patient presents with  . Coronary Artery Disease  . Hypertension  . pvc  . Follow-up    6 month    HPI:    KHRISTOPHER Brock  is a 75 y.o. Caucasian male with CAD with multiple angioplasties over the years, due to restenosis underwent CABG on 09/12/2012 but had recurrence of angina and LIMA occluded needing LAD stenting in 2016.  Past medical history significant for peripheral artery disease with left iliac artery and left SFA stenting in 2014 and drug-coated balloon angioplasty followed by DCB on 09/05/2019 for left common femoral artery.   Past medical history also significant for hypertension, hyperglycemia, hyperlipidemia, hypothyroidism, spontaneous DVT and pulmonary embolism in January 2020 and has been on Xarelto since then.  He has chronic dyspnea on exertion and COPD with ongoing tobacco use and nocturnal hypoxemia on home O2.   He presents here for a 38-month office visit.  States that he is doing well and blood pressures well controlled and is tolerating all his medications well and has not had any further claudication symptoms.  Over the past 2 to 3 weeks, he has noticed recurrence of chest pain, hence he has restarted back the isosorbide mononitrate.  He has not used any sublingual nitroglycerin. Unfortunately still continues to smoke.  Past Medical History:  Diagnosis Date  . AF (atrial fibrillation) ([place])    happened after CABG, has not had it since  . Allergic rhinitis   . Allergy   . Anginal pain ([place]) 1997  . Arthritis    "lower back" (09/26/2015)  . Childhood asthma   . Chronic bronchitis ([place])    Yearly  . COPD (chronic obstructive pulmonary disease) ([place])   . Coronary artery disease    Multiple stents  . Dyslipidemia   . Dysrhythmia   . Femoral hernia of right side 12/28/2016  . GERD (gastroesophageal reflux  disease)   . History of hiatal hernia   . Hyperlipemia   . Hypertension   . Hypothyroidism   . Irritable bowel syndrome   . Myocardial infarct ([place]) 1997   "mild"  . PE (pulmonary thromboembolism) ([place]) 05/2018  . Peripheral vascular disease ([place])    prior stenting  . Pre-diabetes   . Restless legs    Past Surgical History:  Procedure Laterality Date  . CARDIAC CATHETERIZATION  2004   had remote tandem stents in 1997 and 1998 for LAD lesions, RCA stent in 1997 , midprto=ion, and a proximal RCA  SENT IN 1999, all DES; some were IVIS  guide.  [name] CARDIAC CATHETERIZATION  09/07/2012   demonstrated severe 2- vessel disease,patent LAD,patient circ and patent right stents, but 90% ostial LAD whic was new.  . CAROTID DOPPLER  100/24/11   RGT BULB and PROXIMAL ICA 0-49%; LFT. ICA  0-49 ; RGT SUBCLAVIAN ARTERY < 50% ; LFT SUBCLAVIAN normal patency; RGT  and LFT  VERTEBRAL ARTERIES  . COLONOSCOPY  11/06/2016   Diverticulosis of colon. Internal hemorrhoids.   . CORONARY ANGIOPLASTY    . CORONARY ANGIOPLASTY WITH STENT PLACEMENT  '97, '98, '04, '06, '08, '10   seven cardiac stents  . CORONARY ARTERY BYPASS GRAFT N/A 09/12/2012   Procedure: OFF PUMP CORONARY ARTERY BYPASS GRAFTING (CABG);  Surgeon: Melrose Nakayama, MD;  Location: [place] [facility];  Service: Open Heart Surgery;  Laterality: N/A;  Times two using left internal mammary artery and endoscopically harvested right saphenous vein  ;LIMA-LAD and SVG -OM1  . DOPPLER ECHOCARDIOGRAPHY  11/23/2012,09/30/2010    LV EF 50-55%----09/30/2010-LV EF = >55%  . ENDARTERECTOMY FEMORAL Right 12/19/2015   Procedure: ENDARTERECTOMY RIGHT FEMORAL ARTERY;  Surgeon: Serafina Mitchell, MD;  Location: [place];  Service: Vascular;  Laterality: Right;  . event monitor  11/04/2012-11/17/2012   pt denies this hx on 07/24/2014  . EYE SURGERY Bilateral    cataract  . FEMORAL ARTERY STENT Bilateral 7/06  8/08   Lt SFA '06, Rt SFA 8/08  . HERNIA REPAIR  10/2008   "repaired  w/gallbladder OR"  . HERNIA REPAIR  07/2016  . INGUINAL HERNIA REPAIR Right 12/28/2016   Procedure: OPEN REPAIR RIGHT GROIN HERNIA WITH MESH;  Surgeon: Fanny Skates, MD;  Location: [facility];  Service: General;  Laterality: Right;  . INGUINAL HERNIA REPAIR Right 02/21/2020   Procedure: LAPAROSCOPIC RIGHT INGUINAL HERNIA REPAIR WITH MESH;  Surgeon: Ralene Ok, MD;  Location: [place];  Service: General;  Laterality: Right;  . INTRAVASCULAR LITHOTRIPSY  09/05/2019   Procedure: INTRAVASCULAR LITHOTRIPSY;  Surgeon: Adrian Prows, MD;  Location: [facility];  Service: Cardiovascular;;  left common femoral  . LAPAROSCOPIC CHOLECYSTECTOMY  June 2010  . LEFT HEART CATHETERIZATION WITH CORONARY ANGIOGRAM N/A 09/07/2012   Procedure: LEFT HEART CATHETERIZATION WITH CORONARY ANGIOGRAM;  Surgeon: Troy Sine, MD;  Location: [facility] CATH LAB;  Service: Cardiovascular;  Laterality: N/A;  . LEFT HEART CATHETERIZATION WITH CORONARY/GRAFT ANGIOGRAM N/A 07/24/2014   Procedure: LEFT HEART CATHETERIZATION WITH Beatrix Fetters;  Surgeon: Troy Sine, MD; LAD 70%, 50% ISR, mLAD 99%, CFX 30%, patent stent, RCA patent, LIMA-LAD occluded, SVG-OM 1 patent, EF 50-55%  . LEXISCAN STRESS TEST  09/06/2012  . LOWER ARTERIAL DOPPLER  03/25/2012   FGT CIA 0-49%; RGT CFA AND SFA AT STENT 0-49%; RGT DISTAL SFA = <50%; LFT. EIA  50-69%; LFT. CFA 0-49% ;;LFT. SFA  AT STENT NORMAL PATENCY  . LOWER EXTREMITY ANGIOGRAPHY N/A 09/05/2019   Procedure: LOWER EXTREMITY ANGIOGRAPHY;  Surgeon: Adrian Prows, MD;  Location: [place] [facility];  Service: Cardiovascular;  Laterality: N/A;  . NM MYOVIEW LTD  05/06/2012   EF -STUDY NOT GATED, NORMAL STRESS NUCLEAR ,NORMAL LV FUNCTION  . PATCH ANGIOPLASTY Right 12/19/2015   Procedure: PATCH ANGIOPLASTY RIGHT FEMORAL ARTERY;  Surgeon: Serafina Mitchell, MD;  Location: [place];  Service: Vascular;  Laterality: Right;  . PERCUTANEOUS CORONARY STENT INTERVENTION (PCI-S) Right 07/24/2014   Procedure:  PERCUTANEOUS CORONARY STENT INTERVENTION (PCI-S);  Surgeon: Troy Sine, MD; ostial LAD PTCA with angioscope scoring balloon, 3.0 x 34 mm Resolute DES extending from the ostium to the mid LAD, covering all significant stenoses   . PERIPHERAL VASCULAR CATHETERIZATION N/A 09/26/2015   Procedure: Abdominal Aortogram w/Lower Extremity;  Surgeon: Lorretta Harp, MD;  Location: [place] [facility];  Service: Cardiovascular;  Laterality: N/A;  . PERIPHERAL VASCULAR CATHETERIZATION Right 02/20/2016   Procedure: Peripheral Vascular Atherectomy;  Surgeon: Lorretta Harp, MD;  Location: [place] [facility];  Service: Cardiovascular;  Laterality: Right;  SFA  . PERIPHERAL VASCULAR CATHETERIZATION Right 02/20/2016   Procedure: Peripheral Vascular Balloon Angioplasty;  Surgeon: Lorretta Harp, MD;  Location: [place] [facility];  Service: Cardiovascular;  Laterality: Right;  SFA  . PERIPHERAL VASCULAR INTERVENTION  09/05/2019   Procedure: PERIPHERAL VASCULAR INTERVENTION;  Surgeon: Adrian Prows, MD;  Location: [place] [facility];  Service: Cardiovascular;;  left common iliac  . TONSILLECTOMY  1950's  . UPPER GASTROINTESTINAL ENDOSCOPY     Family History  Problem Relation Age of Onset  . Coronary artery disease Father 51  . Heart attack Father   . Coronary artery disease Brother 26       CABG in 2000  . Deep vein thrombosis Brother   . Colon cancer Neg Hx   . Esophageal cancer Neg Hx   . Rectal cancer Neg Hx   . Stomach cancer Neg Hx     Social History   Tobacco Use  . Smoking status: Current Some Day Smoker    Packs/day: 0.50    Years: 56.00    Pack years: 28.00    Types: Cigarettes    Last attempt to quit: 03/25/2016    Years since quitting: 4.2  . Smokeless tobacco: Current User    Types: Chew  Substance Use Topics  . Alcohol use: Yes    Alcohol/week: 1.0 standard drink    Types: 1 Standard drinks or equivalent per week    Comment: 09/26/2015 "varies; might have a few drinks q 2 months"    Marital status: Married   ROS   Review of Systems  Constitutional: Positive for malaise/fatigue.  Cardiovascular: Positive for chest pain and dyspnea on exertion. Negative for claudication, leg swelling and syncope.  Musculoskeletal: Positive for arthritis, back pain and joint pain (left toes pain and swelling).  Gastrointestinal: Negative for melena.   Objective  Blood pressure 123/64, pulse 60, temperature 97.6 F (36.4 C), temperature source Temporal, resp. rate 17, height 5\' 8"  (1.727 m), weight 190 lb 6.4 oz (86.4 kg), SpO2 96 %.  Vitals with BMI 07/05/2020 06/17/2020 03/11/2020  Height 5\' 8"  5\' 8"  5\' 9"   Weight 190 lbs 6 oz 187 lbs 186 lbs  BMI 28.96 21.30 86.57  Systolic 846 962 952  Diastolic 64 60 76  Pulse 60 53 66     Physical Exam Constitutional:      Appearance: He is well-developed.  Cardiovascular:     Rate and Rhythm: Normal rate and regular rhythm.     Pulses:          Carotid pulses are 2+ on the right side with bruit and 2+ on the left side.      Femoral pulses are 2+ on the right side with bruit and 2+ on the left side with bruit.      Popliteal pulses are 1+ on the right side and 2+ on the left side.       Dorsalis pedis pulses are 1+ on the right side and 1+ on the left side.       Posterior tibial pulses are 0 on the right side and 2+ on the left side.     Heart sounds: Normal heart sounds.     Comments: No JVD, no pedal edema Pulmonary:     Breath sounds: Rales (bialteral left base worse than right. Diffuse scattered) present.  Abdominal:     General: Bowel sounds are normal.     Palpations: Abdomen is soft.    Laboratory examination:   Recent Labs    08/31/19 1436 12/12/19 1034 02/14/20 1159 06/17/20 1432  NA 143 140 138 140  K 5.0 3.6 4.0 4.3  CL 106 108 109 106  CO2 24 25 22 27   GLUCOSE 100* 135* 96 107*  BUN 21 22 21 22   CREATININE 1.22 1.07 0.99 1.20  CALCIUM 9.3 9.2 8.9 9.9  GFRNONAA 58* >  60 >60 >60  GFRAA 68 >60 >60  --     estimated creatinine clearance is 57.8 mL/min (by C-G formula based on SCr of 1.2 mg/dL).  CMP Latest Ref Rng & Units 06/17/2020 02/14/2020 12/12/2019  Glucose 70 - 99 mg/dL 107(H) 96 135(H)  BUN 8 - 23 mg/dL 22 21 22   Creatinine 0.61 - 1.24 mg/dL 1.20 0.99 1.07  Sodium 135 - 145 mmol/L 140 138 140  Potassium 3.5 - 5.1 mmol/L 4.3 4.0 3.6  Chloride 98 - 111 mmol/L 106 109 108  CO2 22 - 32 mmol/L 27 22 25   Calcium 8.9 - 10.3 mg/dL 9.9 8.9 9.2  Total Protein 6.5 - 8.1 g/dL 6.9 - 6.3(L)  Total Bilirubin 0.3 - 1.2 mg/dL 0.5 - 0.5  Alkaline Phos 38 - 126 U/L 80 - 85  AST 15 - 41 U/L 21 - 17  ALT 0 - 44 U/L 16 - 14   CBC Latest Ref Rng & Units 06/17/2020 02/14/2020 12/12/2019  WBC 4.0 - 10.5 K/uL 9.3 7.4 9.1  Hemoglobin 13.0 - 17.0 g/dL 15.3 12.9(L) 14.0  Hematocrit 39.0 - 52.0 % 46.6 39.9 41.1  Platelets 150 - 400 K/uL 197 250 196   Last lipids Lab Results  Component Value Date   CHOL 131 04/06/2019   HDL 36 (L) 04/06/2019   LDLCALC 69 04/06/2019   TRIG 148 04/06/2019   CHOLHDL 2.9 02/07/2018   HEMOGLOBIN A1C Lab Results  Component Value Date   HGBA1C 6.3 (H) 02/14/2020   MPG 134.11 02/14/2020   TSH No results for input(s): TSH in the last 8760 hours.   External Labs:  NA   Medications and allergies   Allergies  Allergen Reactions  . Other Anaphylaxis, Shortness Of Breath and Swelling    [place] nuts- "will kill me"  . Clarithromycin Nausea Only  . Fenofibrate Micronized Other (See Comments)    Caused weakness/lethargy  . Metoprolol Other (See Comments)    Fatigue and malaise  . Tricor [Fenofibrate] Other (See Comments)    Tired/weak  . Fish Oil Rash  . Levofloxacin Other (See Comments)    Doesn't remember  . Meloxicam Other (See Comments)    Achy, fatigue  . Metaxalone Other (See Comments)    Doesn't remember  . Niaspan [Niacin] Other (See Comments)    Doesn't remember  . Sulfadiazine Hives and Rash     Current Outpatient Medications on File Prior to  Visit  Medication Sig Dispense Refill  . acetaminophen (TYLENOL) 500 MG tablet Take 500-1,000 mg by mouth every 6 (six) hours as needed for moderate pain.     . Ascorbic Acid (VITAMIN C) 1000 MG tablet Take 1,000 mg by mouth in the morning and at bedtime.     [name] aspirin EC 81 MG tablet Take 81 mg by mouth at bedtime.     . Budeson-Glycopyrrol-Formoterol (BREZTRI AEROSPHERE) 160-9-4.8 MCG/ACT AERO Inhale 2 puffs into the lungs 2 (two) times daily. 5.9 g 0  . BYSTOLIC 5 MG tablet TAKE 1/2 TABLET ONCE DAILY (Patient taking differently: Take 2.5 mg by mouth daily.) 30 tablet 6  . cetirizine (ZYRTEC) 10 MG tablet Take 10 mg by mouth daily.    . Cholecalciferol (VITAMIN D3) 50 MCG (2000 UT) TABS Take 2,000 Units by mouth daily.    . cholestyramine light (PREVALITE) 4 g packet Take 4 g by mouth every evening.     . cyclobenzaprine (FLEXERIL) 10 MG tablet Take 10 mg by mouth 3 (three) times daily as  needed for muscle spasms.     [name] levothyroxine (SYNTHROID, LEVOTHROID) 25 MCG tablet Take 25 mcg by mouth daily before breakfast.     . loperamide (IMODIUM A-D) 2 MG tablet Take 2 mg by mouth 4 (four) times daily as needed for diarrhea or loose stools.    [name] losartan (COZAAR) 100 MG tablet Take 100 mg by mouth daily.    . Melatonin 12 MG TABS Take 12 mg by mouth at bedtime.     . nitroGLYCERIN (NITROSTAT) 0.4 MG SL tablet PLACE 1 TABLET UNDER THE TONGUE, MAY REPEAT EVERY 5 MINUTES MAX 3 TABLETS 25 tablet 11  . pantoprazole (PROTONIX) 40 MG tablet Take 1 tablet (40 mg total) by mouth 2 (two) times daily. (Patient taking differently: Take 40 mg by mouth at bedtime.) 60 tablet 0  . Probiotic Product (PROBIOTIC PO) Take 1 capsule by mouth daily.    . rivaroxaban (XARELTO) 20 MG TABS tablet Take 1 tablet (20 mg total) by mouth daily with supper. 90 tablet 3  . rosuvastatin (CRESTOR) 20 MG tablet TAKE ONE TABLET BY MOUTH EVERY DAY (Patient taking differently: Take 10 mg by mouth daily.) 90 tablet 3  . Zinc 50 MG TABS  Take 50 mg by mouth in the morning and at bedtime.    [name] zolpidem (AMBIEN) 10 MG tablet Take 10 mg by mouth at bedtime as needed for sleep.      No current facility-administered medications on file prior to visit.    Radiology:   No results found.  Cardiac Studies:   Coronary angiogram and stenting to his LAD and RCA  in 1997, 1998, and in 2004 to his proximal left circumflex coronary artery.  Due to recurrent restenosis underwent CABG on 09/12/2012 with off-pump LIMA to LAD and SVG to OM1.  CABG  by Dr. Roxan Hockey on 09/12/2012 with an off-pump LIMA to LAD (Occluded by angoigram in 2016) and SVG to circumflex marginal 1 vessel.  Coronary angiogram 07/24/2014: High-grade stenosis to the proximal LAD, patent circumflex stent, patent SVG to OM1 however LIMA to LAD occluded.  Successful stenting to proximal LAD with 3.0 x 34 mm resolute DES, postdilated to 3.4 mm.  Peripheral arteriogram: Stenting to his left SFA in 2004 and right SFA in 2008.  Stenting of left external iliac artery, right common femoral artery endarterectomy and patch angioplasty in August 2017 with staged right SFA atherectomy.  Abdominal aortic and iliac duplex 11/03/2018: Stenosis: +--------------------+-------------+---------------+  Location      Stenosis   Stent       +--------------------+-------------+---------------+  Right Common Iliac <50% stenosis          +--------------------+-------------+---------------+  Left Common Iliac  <50% stenosis          +--------------------+-------------+---------------+  Right External Iliac<50% stenosis          +--------------------+-------------+---------------+  Left External Iliac        50-99% stenosis  +--------------------+-------------+---------------+   Lower extremity arterial duplex 11/02/2018: Right: Atherosclerosis throughout the right lower extremity without focal stenosis.  Patent mid-distal SFA  stent with 1-49% stenosis.  Left: Atherosclerosis throughout the left lower extremity.  Patent distal SFA stent with 1-49% stenosis.   Carotid artery duplex  08/07/2019:  Stenosis in the right internal carotid artery (16-49%). Stenosis in the  right external carotid artery (<50%).  Stenosis in the left internal carotid artery (16-49%). Stenosis in the  left common carotid artery (<50%).  Antegrade right vertebral artery flow. Antegrade left vertebral artery  flow.  Follow up in one year is appropriate if clinically indicated.   Echocardiogram 08/07/2019:  Normal LV systolic function with visual EF 50-55%. Left ventricle cavity  is normal in size. Normal global wall motion. No obvious regional wall  motion abnormalities. Mild concentric hypertrophy of the left ventricle.  Indeterminate diastolic filling pattern, normal LAP. Calculated EF 50%.  Left atrial cavity is severely dilated.  Visually, right atrial cavity is mildly dilated.  Mild mitral regurgitation.  Moderate tricuspid regurgitation. No evidence of pulmonary hypertension. RVSP measures 33 mmHg.  Mild pulmonic regurgitation.  IVC is dilated with a respiratory response of >50%.  No significant change compared to previous study 05/25/2018.   Lexiscan (Walking with mod Bruce)Tetrofosmin Stress Test  08/07/2019: Nondiagnostic ECG stress. Perfusion imaging study is abnormal.  Mild inferior, inferolateral small sized defect consistent with ischemia. Left ventricular systolic function is mildly depressed, LVEF calculated at 49% with inferior hypokinesis. Compared to 10/22/2016 report- normal study.  Intermediate risk study.   Peripheral arteriogram 09/05/19: Abdominal aortogram revealed presence of 2 renal arteries 1 on either sides, widely patent.  Mild atherosclerotic changes abdominal aorta noted.  Bilateral aortoiliac bifurcation was widely patent with mild calcification.  There was diffuse moderate calcification noted  throughout the entire iliac artery system.  Bilateral internal iliac arteries are occluded.  There is a old left external iliac artery stent which has a 40 to 50% in-stent restenosis focally without pressure gradient.  Left common iliac artery has a 80% stenosis with a 20 to 30 mm pressure gradient. Left common femoral artery is very long and profunda bifurcation happens distal than normal, the left common femoral artery has a calcific 80 to 90% stenosis, brisk flow in the left SFA and old left SFA stent is widely patent.  Below the left knee there is two-vessel runoff with occluded left PT.  Right common femoral artery patch angioplasty site is widely patent.  Right SFA shows mild diffuse disease with moderate amount of calcification.  Previously placed right mid SFA stent is widely patent.  There is three-vessel runoff below the right knee.  Intervention data: 1. Left common femoral artery: PTA with Shockwave lithotripsy with a 7.0 x 60 mm shockwave balloon followed by drug-coated balloon angioplasty with a 8 mm x 40 mm Lutonix angioplasty, stenosis reduced from 85% to less than 10% with brisk flow. 2. PTA and self-expanding 10.0 x 40 mm absolute Pro stent to the left common iliac artery followed by post angioplasty with a 9.0 x 40 mm [place] at 8 atmospheric pressure for 60 seconds, stenosis reduced from 80% to 0%.  There was also left external iliac artery old stent that had a 50% stenosis was also stented with the same self-expanding stent overlapped followed by balloon angioplasty.  Overall no residual stenosis.  ABI 09/26/2019:  This exam reveals mildly decreased perfusion of the right lower extremity, noted at the anterior tibial artery level (ABI 0.83) and mildly decreased  perfusion of the left lower extremity, noted at the anterior tibial artery level (ABI 0.86). Compared to 08/31/2019, bilateral ABI 0.81. (H/O Left CIA stent and left CFA angioplasty).  EKG:    EKG 07/05/2020: Sinus  bradycardia at the rate of 55 bpm, left atrial enlargement, normal axis.  Early repolarization.  No significant change from 07/26/2019    Assessment     ICD-10-CM   1. Coronary artery disease of native artery of native heart with stable angina pectoris ([place])  I25.118 EKG 12-Lead  2. Shortness of breath  R06.02   3. Peripheral artery disease  I73.9   4. Hypercholesteremia  E78.00 Lipid Panel With LDL/HDL Ratio  5. Benign prostatic hyperplasia with urinary frequency  N40.1 PSA   R35.0   6. Panic attack  F41.0 DULoxetine (CYMBALTA) 30 MG capsule    Meds ordered this encounter  Medications  . DULoxetine (CYMBALTA) 30 MG capsule    Sig: Take 1 capsule (30 mg total) by mouth daily.    Dispense:  30 capsule    Refill:  3   There are no discontinued medications.  Orders Placed This Encounter  Procedures  . Lipid Panel With LDL/HDL Ratio  . PSA  . EKG 12-Lead    Recommendations:   ILIYA SPIVACK  is a 75 y.o. Caucasian male with CAD with multiple angioplasties over the years, due to restenosis underwent CABG on 09/12/2012 but had recurrence of angina and LIMA occluded needing LAD stenting in 2016.  Past medical history significant for peripheral artery disease with left iliac artery and left SFA stenting in 2014 and drug-coated balloon angioplasty followed by DCB on 09/05/2019 for left common femoral artery.   Past medical history also significant for hypertension, hyperglycemia, hyperlipidemia, hypothyroidism, spontaneous DVT and pulmonary embolism in January 2020 and has been on Xarelto since then.  He has chronic dyspnea on exertion and COPD with ongoing tobacco use and nocturnal hypoxemia on home O2.  Since being on home oxygen, he has noticed marked improvement in daytime somnolence and fatigue.  Over the past few weeks he has had recurrence of angina and he has started back isosorbide mononitrate.  Advised him that if his anginal symptoms continue to get worse, to immediately contact us  given his complex cardiac history.  Blood pressure is well controlled.  Lipids are not been checked, lipid profile ordered today.  Patient request that I perform PSA as well.  Patient has mild symptoms of frequency.  He has had few episodes of severe anxiety, symptoms suggestive of panic attacks.  We will try Cymbalta low-dose 30 mg on a as needed basis, he could also started on a daily basis to see whether this would keep him stable and also help with smoking cessation.  Otherwise stable, I will see him back in 6 months for follow-up.    Adrian Prows, MD, [facility] 07/05/2020, 12:52 PM Office: [phone]

## 2020-07-05 ENCOUNTER — Other Ambulatory Visit: Payer: Self-pay

## 2020-07-05 ENCOUNTER — Ambulatory Visit: Payer: Medicare Other | Admitting: Cardiology

## 2020-07-05 ENCOUNTER — Encounter: Payer: Self-pay | Admitting: Cardiology

## 2020-07-05 VITALS — BP 123/64 | HR 60 | Temp 97.6°F | Resp 17 | Ht 68.0 in | Wt 190.4 lb

## 2020-07-05 DIAGNOSIS — F410 Panic disorder [episodic paroxysmal anxiety] without agoraphobia: Secondary | ICD-10-CM

## 2020-07-05 DIAGNOSIS — R0602 Shortness of breath: Secondary | ICD-10-CM

## 2020-07-05 DIAGNOSIS — I25118 Atherosclerotic heart disease of native coronary artery with other forms of angina pectoris: Secondary | ICD-10-CM

## 2020-07-05 DIAGNOSIS — I739 Peripheral vascular disease, unspecified: Secondary | ICD-10-CM

## 2020-07-05 DIAGNOSIS — N401 Enlarged prostate with lower urinary tract symptoms: Secondary | ICD-10-CM

## 2020-07-05 DIAGNOSIS — E7800 Pure hypercholesterolemia, unspecified: Secondary | ICD-10-CM

## 2020-07-05 MED ORDER — DULOXETINE HCL 30 MG PO CPEP: 30.0000 mg | ORAL_CAPSULE | Freq: Every day | ORAL | 3 refills | Status: DC

## 2020-07-12 LAB — LIPID PANEL WITH LDL/HDL RATIO
Cholesterol, Total: 154 mg/dL (ref 100–199)
HDL: 40 mg/dL (ref >39)
LDL Chol Calc (NIH): 82 mg/dL (ref 0–99)
LDL/HDL Ratio: 2.1 ratio (ref 0.0–3.6)
Triglycerides: 186 mg/dL — ABNORMAL HIGH (ref 0–149)
VLDL Cholesterol Cal: 32 mg/dL (ref 5–40)

## 2020-07-12 LAB — PSA: Prostate Specific Ag, Serum: 0.7 ng/mL (ref 0.0–4.0)

## 2020-08-26 ENCOUNTER — Other Ambulatory Visit: Payer: Self-pay | Admitting: Student

## 2020-08-26 DIAGNOSIS — R0602 Shortness of breath: Secondary | ICD-10-CM

## 2020-08-26 DIAGNOSIS — I25118 Atherosclerotic heart disease of native coronary artery with other forms of angina pectoris: Secondary | ICD-10-CM

## 2020-08-26 NOTE — Telephone Encounter (Signed)
Please call pt and schedule appt for Wednesday. Make my 10:30 appt 30 min and see if the pt can come at 11;00.

## 2020-08-27 LAB — BASIC METABOLIC PANEL
BUN/Creatinine Ratio: 21 (ref 10–24)
BUN: 24 mg/dL (ref 8–27)
CO2: 22 mmol/L (ref 20–29)
Calcium: 9.1 mg/dL (ref 8.6–10.2)
Chloride: 104 mmol/L (ref 96–106)
Creatinine, Ser: 1.15 mg/dL (ref 0.76–1.27)
Glucose: 90 mg/dL (ref 65–99)
Potassium: 4.3 mmol/L (ref 3.5–5.2)
Sodium: 142 mmol/L (ref 134–144)
eGFR: 67 mL/min/{1.73_m2} (ref >59)

## 2020-08-27 LAB — BRAIN NATRIURETIC PEPTIDE: BNP: 84.7 pg/mL (ref 0.0–100.0)

## 2020-08-27 LAB — MAGNESIUM: Magnesium: 1.8 mg/dL (ref 1.6–2.3)

## 2020-09-09 NOTE — Progress Notes (Signed)
HPI  M former Smoker (54 pk yrs) followed for  for asthma/ bronchitis complicated by Tobacco use, CAD/ CABG,  PAD/ Claudication, GERD, PE 05/24/2018, Nocturnal Hypoxemia, PAFib, GERD, Covid infection 9/ 2021,  PFT 09/08/12   ? Not done NPSG 12/27/19- GNA/  No OSA but desat to 67%, desat time 45 minutes dx Nocturnal Hypoxemia) (Had Sleep Study- GNA/  No OSA but desat to 67%, desat time 45 minutes dx Nocturnal Hypoxemia)  -------------------------------------------------------------------------------------- .  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 Managed by [facility] infection 9//2021 Home O2 2L/ [employer]-    Started by Dr Einar Gip (Had Sleep Study- GNA/  No OSA but desat to 67%, desat time 45 minutes dx Nocturnal Hypoxemia) Covid vax- Had AB infusion therapy Flu vax-  Today senior His Covid infection last year mostly just felt like sinus infection until after MAB infusion. Then got fever, myalgias, dyspnea.  Has not smoked now in 4 days- strongly encouraged to stay off. Some cough , clear phlegm. No blood, nodes or fever now. He will likely benefit from a nebulizer machine.   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  Ran out of Breztri samples. Didn't call us, but seemed to help. Wary of cost.  CXR 03/11/20- IMPRESSION: Mild bronchitic changes with streaky  atelectasis left base.   ROS-see HPI + = positive Constitutional:   No-   weight loss, night sweats, fevers, chills, fatigue, lassitude. HEENT:   No-  headaches, difficulty swallowing, tooth/dental problems, sore throat,       No-  sneezing, itching, ear ache, nasal congestion, post nasal drip,  CV:  No-   chest pain, orthopnea, PND, swelling in lower extremities, anasarca, dizziness, palpitations Resp: +-shortness of breath with exertion or at rest.              +   productive cough,  + non-productive cough,  No- coughing up of blood.              No-   change in color of mucus.  No- wheezing.   Skin: No-   rash or lesions. GI:  No-   heartburn, indigestion, abdominal pain, nausea, vomiting,  GU:  MS:  No-   joint pain or swelling.  . Neuro-     nothing unusual Psych:  No- change in mood or affect. No depression or anxiety.  No memory loss.  OBJ- Physical Exam General- Alert, Oriented, Affect-appropriate, Distress- none acute Skin- rash-none, lesions- none, excoriation- none Lymphadenopathy- none Head- atraumatic            Eyes- Gross vision intact, PERRLA, conjunctivae and secretions clear            Ears- Hearing, canals-normal            Nose- Clear, no-Septal dev, mucus, polyps, erosion, perforation             Throat- Mallampati II , mucosa clear , drainage- none, tonsils-  atrophic, dentures Neck- flexible , trachea midline, no stridor , thyroid nl, carotid no bruit Chest - symmetrical excursion , unlabored           Heart/CV- RRR occasional extra beat , no murmur , no gallop  , no rub, nl s1 s2                           - JVD- none , edema- none, stasis changes- none, varices- none           Lung- + wheeze+ , +distant, cough- none , dullness-none, rub- none           Chest wall-  Abd-  Br/ Gen/ Rectal- Not done, not indicated Extrem- cyanosis- none, clubbing, none, atrophy- none, strength- nl Neuro- grossly intact to observation

## 2020-09-10 ENCOUNTER — Ambulatory Visit (INDEPENDENT_AMBULATORY_CARE_PROVIDER_SITE_OTHER): Payer: Medicare Other | Admitting: Internal Medicine

## 2020-09-10 ENCOUNTER — Other Ambulatory Visit: Payer: Self-pay

## 2020-09-10 ENCOUNTER — Encounter: Payer: Self-pay | Admitting: Internal Medicine

## 2020-09-10 VITALS — BP 118/50 | HR 50 | Temp 97.3°F | Ht 69.0 in | Wt 193.6 lb

## 2020-09-10 DIAGNOSIS — I25118 Atherosclerotic heart disease of native coronary artery with other forms of angina pectoris: Secondary | ICD-10-CM

## 2020-09-10 DIAGNOSIS — G4734 Idiopathic sleep related nonobstructive alveolar hypoventilation: Secondary | ICD-10-CM

## 2020-09-10 DIAGNOSIS — J449 Chronic obstructive pulmonary disease, unspecified: Secondary | ICD-10-CM | POA: Diagnosis not present

## 2020-09-10 MED ORDER — BREZTRI AEROSPHERE 160-9-4.8 MCG/ACT IN AERO: INHALATION_SPRAY | RESPIRATORY_TRACT | 12 refills | Status: DC

## 2020-09-10 NOTE — Assessment & Plan Note (Signed)
Not smoking. Currently at [place] for [employer]. Schedule PFT

## 2020-09-10 NOTE — Assessment & Plan Note (Signed)
Continues to benefit from O2 during sleep Plan- continue O2 2l

## 2020-09-10 NOTE — Patient Instructions (Addendum)
Order- schedule PFT   Dx COPD mixed type  Script sent refilling  Breztri. Inhale 2 puffs then rinse mouth twice daily. Use your spacer and/ or try using Breztri right before meals to help prevent thrush  Please cal if we can help

## 2020-09-16 ENCOUNTER — Telehealth: Payer: Self-pay

## 2020-09-16 NOTE — Telephone Encounter (Signed)
S/w pt per Cecille Rubin with imaging and he is aware of his 12/16/20 doppler   Maiana Hennigan

## 2020-12-16 ENCOUNTER — Encounter: Payer: Self-pay | Admitting: Family

## 2020-12-16 ENCOUNTER — Encounter (HOSPITAL_BASED_OUTPATIENT_CLINIC_OR_DEPARTMENT_OTHER): Payer: Self-pay

## 2020-12-16 ENCOUNTER — Other Ambulatory Visit (HOSPITAL_BASED_OUTPATIENT_CLINIC_OR_DEPARTMENT_OTHER): Payer: Medicare Other

## 2020-12-16 ENCOUNTER — Inpatient Hospital Stay (HOSPITAL_BASED_OUTPATIENT_CLINIC_OR_DEPARTMENT_OTHER): Payer: Medicare Other | Admitting: Family

## 2020-12-16 ENCOUNTER — Other Ambulatory Visit: Payer: Self-pay

## 2020-12-16 ENCOUNTER — Telehealth: Payer: Self-pay | Admitting: *Deleted

## 2020-12-16 ENCOUNTER — Ambulatory Visit (HOSPITAL_BASED_OUTPATIENT_CLINIC_OR_DEPARTMENT_OTHER)
Admission: RE | Admit: 2020-12-16 | Discharge: 2020-12-16 | Disposition: A | Discharge status: Home / Self Care | Payer: Medicare Other | Source: Ambulatory Visit | Attending: Family | Admitting: Family

## 2020-12-16 ENCOUNTER — Inpatient Hospital Stay: Payer: Medicare Other | Attending: Hematology & Oncology

## 2020-12-16 VITALS — BP 130/57 | HR 41 | Temp 98.2°F | Resp 18 | Wt 196.0 lb

## 2020-12-16 DIAGNOSIS — I824Y1 Acute embolism and thrombosis of unspecified deep veins of right proximal lower extremity: Secondary | ICD-10-CM

## 2020-12-16 DIAGNOSIS — R079 Chest pain, unspecified: Secondary | ICD-10-CM | POA: Insufficient documentation

## 2020-12-16 DIAGNOSIS — I2699 Other pulmonary embolism without acute cor pulmonale: Secondary | ICD-10-CM | POA: Insufficient documentation

## 2020-12-16 LAB — CMP (CANCER CENTER ONLY)
ALT: 14 U/L (ref 0–44)
AST: 19 U/L (ref 15–41)
Albumin: 3.9 g/dL (ref 3.5–5.0)
Alkaline Phosphatase: 74 U/L (ref 38–126)
Anion gap: 7 (ref 5–15)
BUN: 15 mg/dL (ref 8–23)
CO2: 29 mmol/L (ref 22–32)
Calcium: 9.5 mg/dL (ref 8.9–10.3)
Chloride: 106 mmol/L (ref 98–111)
Creatinine: 1.24 mg/dL (ref 0.61–1.24)
GFR, Estimated: >60 mL/min (ref >60)
Glucose, Bld: 147 mg/dL — ABNORMAL HIGH (ref 70–99)
Potassium: 4.2 mmol/L (ref 3.5–5.1)
Sodium: 142 mmol/L (ref 135–145)
Total Bilirubin: 0.5 mg/dL (ref 0.3–1.2)
Total Protein: 6.7 g/dL (ref 6.5–8.1)

## 2020-12-16 LAB — CBC WITH DIFFERENTIAL (CANCER CENTER ONLY)
Abs Immature Granulocytes: 0.02 10*3/uL (ref 0.00–0.07)
Basophils Absolute: 0.1 10*3/uL (ref 0.0–0.1)
Basophils Relative: 1 %
Eosinophils Absolute: 0.5 10*3/uL (ref 0.0–0.5)
Eosinophils Relative: 5 %
HCT: 41.5 % (ref 39.0–52.0)
Hemoglobin: 13.5 g/dL (ref 13.0–17.0)
Immature Granulocytes: 0 %
Lymphocytes Relative: 28 %
Lymphs Abs: 2.6 10*3/uL (ref 0.7–4.0)
MCH: 29 pg (ref 26.0–34.0)
MCHC: 32.5 g/dL (ref 30.0–36.0)
MCV: 89.2 fL (ref 80.0–100.0)
Monocytes Absolute: 0.9 10*3/uL (ref 0.1–1.0)
Monocytes Relative: 10 %
Neutro Abs: 5.2 10*3/uL (ref 1.7–7.7)
Neutrophils Relative %: 56 %
Platelet Count: 269 10*3/uL (ref 150–400)
RBC: 4.65 MIL/uL (ref 4.22–5.81)
RDW: 12.4 % (ref 11.5–15.5)
WBC Count: 9.3 10*3/uL (ref 4.0–10.5)
nRBC: 0 % (ref 0.0–0.2)

## 2020-12-16 MED ORDER — RIVAROXABAN 20 MG PO TABS: 20.0000 mg | ORAL_TABLET | Freq: Every day | ORAL | 3 refills | Status: AC

## 2020-12-16 MED ORDER — IOHEXOL 350 MG/ML SOLN
100.0000 mL | Freq: Once | INTRAVENOUS | Status: AC | PRN
  Administered 2020-12-16: 100 mL via INTRAVENOUS

## 2020-12-16 NOTE — Telephone Encounter (Signed)
Per 12/16/20 los - gave upcoming appointments - confirmed - print calendar

## 2020-12-16 NOTE — Progress Notes (Signed)
Hematology and Oncology Follow Up Visit  Brian Brock [id number] [birth year] 75 y.o. 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 He is doing well but notes fatigue at times.  No blood loss noted. No bruising or petechiae. He continues to tolerate Xarelto nicely.  Repeat CT angio today showed no evidence of PE. He also had a repeat right lower extremity US which showed a partially occlusive filling defect within the right tibioperoneal trunk, posterior tibial and peroneal veins with evidence of partial recanalization felt to be consistent with chronic right lower extremity DVT.  No fever, chills, n/v, cough, rash, dizziness, SOB, palpitations, abdominal pain or changes in bowel or bladder habits.  He has occasional twinges of mid sternal chest discomfort. These last only a few seconds.  No swelling or tenderness in his extremities at this time.  He has numbness and tingling in his left thumb due to chronic neck issues.  No falls or syncope to report.  He has maintained a good appetite and is staying well hydrated. His weight is stable at 196 lbs.   ECOG Performance Status: 1 - Symptomatic but completely ambulatory  Medications:  Allergies as of 12/16/2020       Reactions   Other Anaphylaxis, Shortness Of Breath, Swelling   [place] nuts- "will kill me"   Clarithromycin Nausea Only   Fenofibrate Micronized Other (See Comments)   Caused weakness/lethargy   Metoprolol Other (See Comments)   Fatigue and malaise   Tricor [fenofibrate] Other (See Comments)   Tired/weak   Fish Oil Rash   Levofloxacin Other (See Comments)   Doesn't  remember   Meloxicam Other (See Comments)   Achy, fatigue   Metaxalone Other (See Comments)   Doesn't remember   Niaspan [niacin] Other (See Comments)   Doesn't remember   Sulfadiazine Hives, Rash        Medication List        Accurate as of December 16, 2020 10:58 AM. If you have any questions, ask your nurse or doctor.          acetaminophen 500 MG tablet Commonly known as: TYLENOL Take 500-1,000 mg by mouth every 6 (six) hours as needed for moderate pain.   aspirin EC 81 MG tablet Take 81 mg by mouth at bedtime.   Breztri Aerosphere 160-9-4.8 MCG/ACT Aero Generic drug: Budeson-Glycopyrrol-Formoterol Inhale 2 puffs into the lungs 2 (two) times daily.   Breztri Aerosphere 160-9-4.8 MCG/ACT Aero Generic drug: Budeson-Glycopyrrol-Formoterol Inhale 2 puffs then rinse mouth twice daily   Bystolic 5 MG tablet Generic drug: nebivolol TAKE 1/2 TABLET ONCE DAILY What changed: how much to take   cetirizine 10 MG tablet Commonly known as: ZYRTEC Take 10 mg by mouth daily.   cholestyramine light 4 g packet Commonly known as: PREVALITE Take 4 g by mouth every evening.   cyclobenzaprine 10 MG tablet Commonly known as: FLEXERIL Take 10 mg by mouth 3 (three) times daily as needed for muscle spasms.   DULoxetine 30 MG capsule Commonly known as: Cymbalta Take 1 capsule (30 mg total) by mouth daily.   levothyroxine 25 MCG tablet Commonly  known as: SYNTHROID Take 25 mcg by mouth daily before breakfast.   loperamide 2 MG tablet Commonly known as: IMODIUM A-D Take 2 mg by mouth 4 (four) times daily as needed for diarrhea or loose stools.   losartan 100 MG tablet Commonly known as: COZAAR Take 100 mg by mouth daily.   Melatonin 12 MG Tabs Take 12 mg by mouth at bedtime.   nitroGLYCERIN 0.4 MG SL tablet Commonly known as: NITROSTAT PLACE 1 TABLET UNDER THE TONGUE, MAY REPEAT EVERY 5 MINUTES MAX 3 TABLETS   pantoprazole 40 MG tablet Commonly known as:  PROTONIX Take 1 tablet (40 mg total) by mouth 2 (two) times daily. What changed: when to take this   PROBIOTIC PO Take 1 capsule by mouth daily.   rivaroxaban 20 MG Tabs tablet Commonly known as: XARELTO Take 1 tablet (20 mg total) by mouth daily with supper.   rosuvastatin 20 MG tablet Commonly known as: CRESTOR TAKE ONE TABLET BY MOUTH EVERY DAY What changed: how much to take   vitamin C 1000 MG tablet Take 1,000 mg by mouth in the morning and at bedtime.   Vitamin D3 50 MCG (2000 UT) Tabs Take 2,000 Units by mouth daily.   Zinc 50 MG Tabs Take 50 mg by mouth in the morning and at bedtime.   zolpidem 10 MG tablet Commonly known as: AMBIEN Take 10 mg by mouth at bedtime as needed for sleep.        Allergies:  Allergies  Allergen Reactions   Other Anaphylaxis, Shortness Of Breath and Swelling    [place] nuts- "will kill me"   Clarithromycin Nausea Only   Fenofibrate Micronized Other (See Comments)    Caused weakness/lethargy   Metoprolol Other (See Comments)    Fatigue and malaise   Tricor [Fenofibrate] Other (See Comments)    Tired/weak   Fish Oil Rash   Levofloxacin Other (See Comments)    Doesn't remember   Meloxicam Other (See Comments)    Achy, fatigue   Metaxalone Other (See Comments)    Doesn't remember   Niaspan [Niacin] Other (See Comments)    Doesn't remember   Sulfadiazine Hives and Rash    Past Medical History, Surgical history, Social history, and Family History were reviewed and updated.  Review of Systems: All other 10 point review of systems is negative.   Physical Exam:  vitals were not taken for this visit.   Wt Readings from Last 3 Encounters:  09/10/20 193 lb 9.6 oz (87.8 kg)  07/05/20 190 lb 6.4 oz (86.4 kg)  06/17/20 187 lb (84.8 kg)    Ocular: Sclerae unicteric, pupils equal, round and reactive to light Ear-nose-throat: Oropharynx clear, dentition fair Lymphatic: No cervical or supraclavicular adenopathy Lungs no rales or  rhonchi, good excursion bilaterally Heart regular rate and rhythm, no murmur appreciated Abd soft, nontender, positive bowel sounds MSK no focal spinal tenderness, no joint edema Neuro: non-focal, well-oriented, appropriate affect Breasts: Deferred   Lab Results  Component Value Date   WBC 9.3 12/16/2020   HGB 13.5 12/16/2020   HCT 41.5 12/16/2020   MCV 89.2 12/16/2020   PLT 269 12/16/2020   Lab Results  Component Value Date   FERRITIN 135.1 11/03/2017   IRON 102 11/03/2017   IRONPCTSAT 27.5 11/03/2017   Lab Results  Component Value Date   RBC 4.65 12/16/2020   No results found for: KPAFRELGTCHN, LAMBDASER, KAPLAMBRATIO No results found for: IGGSERUM, IGA, IGMSERUM No results found for: TOTALPROTELP, ALBUMINELP, A1GS, A2GS,  Tillman Sers, SPEI   Chemistry      Component Value Date/Time   NA 142 12/16/2020 0909   NA 142 08/26/2020 1347   K 4.2 12/16/2020 0909   CL 106 12/16/2020 0909   CO2 29 12/16/2020 0909   BUN 15 12/16/2020 0909   BUN 24 08/26/2020 1347   CREATININE 1.24 12/16/2020 0909   CREATININE 1.05 02/12/2016 1008      Component Value Date/Time   CALCIUM 9.5 12/16/2020 0909   ALKPHOS 74 12/16/2020 0909   AST 19 12/16/2020 0909   ALT 14 12/16/2020 0909   BILITOT 0.5 12/16/2020 0909       Impression and Plan: Mr. 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   He continues to do well on Xarelto. CT angio showed no evidence of PE and [place] of right lower extremity showed chronic DVT effecting the right tibioperoneal trunk, posterior tibial and peroneal veins.   We ill plan to see him again in another 6 months.  He can contact our office with any questions or concerns.   Laverna Peace, NP 8/1/202210:58 AM

## 2021-01-01 ENCOUNTER — Encounter: Payer: Self-pay | Admitting: Cardiology

## 2021-01-01 ENCOUNTER — Ambulatory Visit: Payer: Medicare Other | Admitting: Cardiology

## 2021-01-01 ENCOUNTER — Other Ambulatory Visit: Payer: Self-pay

## 2021-01-01 VITALS — BP 130/60 | HR 60 | Temp 98.3°F | Resp 16 | Ht 69.0 in | Wt 199.4 lb

## 2021-01-01 DIAGNOSIS — I6523 Occlusion and stenosis of bilateral carotid arteries: Secondary | ICD-10-CM

## 2021-01-01 DIAGNOSIS — I10 Essential (primary) hypertension: Secondary | ICD-10-CM

## 2021-01-01 DIAGNOSIS — E7800 Pure hypercholesterolemia, unspecified: Secondary | ICD-10-CM

## 2021-01-01 DIAGNOSIS — M109 Gout, unspecified: Secondary | ICD-10-CM

## 2021-01-01 DIAGNOSIS — I25118 Atherosclerotic heart disease of native coronary artery with other forms of angina pectoris: Secondary | ICD-10-CM

## 2021-01-01 DIAGNOSIS — J449 Chronic obstructive pulmonary disease, unspecified: Secondary | ICD-10-CM

## 2021-01-01 DIAGNOSIS — I739 Peripheral vascular disease, unspecified: Secondary | ICD-10-CM

## 2021-01-01 NOTE — Progress Notes (Signed)
Primary Physician/Referring:  [name], MD  Patient ID: Brian Brock, male    DOB: Apr 16, 1946, 75 y.o.   MRN: [id number]  Chief Complaint  Patient presents with   Coronary artery disease of native artery of native heart wi   Claudication in peripheral vascular disease   Hypertension   Hyperlipidemia   Follow-up    6 month   HPI:    Brian Brock  is a 75 y.o. Caucasian male with CAD with multiple angioplasties over the years, due to restenosis underwent CABG on 09/12/2012 but had recurrence of angina and LIMA occluded needing LAD stenting in 2016.  Past medical history significant for peripheral artery disease with left iliac artery and left SFA stenting in 2014 and drug-coated balloon angioplasty followed by DCB on 09/05/2019 for left common femoral artery.   Past medical history also significant for hypertension, hyperglycemia, hyperlipidemia, hypothyroidism, spontaneous DVT and pulmonary embolism in January 2020 and has been on Xarelto since then.  He has chronic dyspnea on exertion and COPD with quit tobacco use  in Apr 2022, nocturnal hypoxemia on home O2.  He presents here for a 6-monthoffice visit.  States that he is doing well and blood pressures well controlled and is tolerating all his medications well and has not had any further claudication symptoms. He continues to state he has less energy and has occasional sharp pain, although [facility]ional chest pain or claudication.    Past Medical History:  Diagnosis Date   AF (atrial fibrillation) (HCC)    happened after CABG, has not had it since   Allergic rhinitis    Allergy    Anginal pain ([facility] 1997   Arthritis    "lower back" (09/26/2015)   Childhood asthma    Chronic bronchitis (HCC)    Yearly   COPD (chronic obstructive pulmonary disease) ([facility]    Coronary artery disease    Multiple stents   Dyslipidemia    Dysrhythmia    Femoral hernia of  right side 12/28/2016   GERD (gastroesophageal reflux disease)    History of hiatal hernia    Hyperlipemia    Hypertension    Hypothyroidism    Irritable bowel syndrome    Myocardial infarct ([place] 1997   "mild"   PE (pulmonary thromboembolism) ([facility] 05/2018   Peripheral vascular disease ([facility]    prior stenting   Pre-diabetes    Restless legs    Past Surgical History:  Procedure Laterality Date   CARDIAC CATHETERIZATION  2004   had remote tandem stents in 1997 and 1998 for LAD lesions, RCA stent in 1997 , midprto=ion, and a proximal RCA  SENT IN 1999, all DES; some were IVIS  guide.   CARDIAC CATHETERIZATION  09/07/2012   demonstrated severe 2- vessel disease,patent LAD,patient circ and patent right stents, but 90% ostial LAD whic was new.   CAROTID DOPPLER  100/24/11   RGT BULB and PROXIMAL ICA 0-49%; LFT. ICA  0-49 ; RGT SUBCLAVIAN ARTERY < 50% ; LFT SUBCLAVIAN normal patency; RGT  and LFT  VERTEBRAL ARTERIES   COLONOSCOPY  11/06/2016   Diverticulosis of colon. Internal hemorrhoids.    CORONARY ANGIOPLASTY     CORONARY ANGIOPLASTY WITH STENT PLACEMENT  '97, '98, '04, '06, '08, '10   seven cardiac stents   CORONARY ARTERY BYPASS GRAFT N/A 09/12/2012   Procedure: OFF PUMP CORONARY ARTERY BYPASS GRAFTING (CABG);  Surgeon: SMelrose Nakayama MD;  Location: [facility];  Service: Open Heart Surgery;  Laterality: N/A;  Times two using left internal mammary artery and endoscopically harvested right saphenous vein  ;LIMA-LAD and SVG -OM1   DOPPLER ECHOCARDIOGRAPHY  11/23/2012,09/30/2010    LV EF 50-55%----09/30/2010-LV EF = >55%   ENDARTERECTOMY FEMORAL Right 12/19/2015   Procedure: ENDARTERECTOMY RIGHT FEMORAL ARTERY;  Surgeon: Serafina Mitchell, MD;  Location: [place] OR;  Service: Vascular;  Laterality: Right;   event monitor  11/04/2012-11/17/2012   pt denies this hx on 07/24/2014   EYE SURGERY Bilateral    cataract   FEMORAL ARTERY STENT Bilateral 7/06  8/08   Lt SFA '06, Rt SFA 8/08   HERNIA  REPAIR  10/2008   "repaired w/gallbladder OR"   HERNIA REPAIR  07/2016   INGUINAL HERNIA REPAIR Right 12/28/2016   Procedure: OPEN REPAIR RIGHT GROIN HERNIA WITH MESH;  Surgeon: Fanny Skates, MD;  Location: [place];  Service: General;  Laterality: Right;   INGUINAL HERNIA REPAIR Right 02/21/2020   Procedure: LAPAROSCOPIC RIGHT INGUINAL HERNIA REPAIR WITH MESH;  Surgeon: Ralene Ok, MD;  Location: [place];  Service: General;  Laterality: Right;   INTRAVASCULAR LITHOTRIPSY  09/05/2019   Procedure: INTRAVASCULAR LITHOTRIPSY;  Surgeon: Adrian Prows, MD;  Location: [place] [facility];  Service: Cardiovascular;;  left common femoral   LAPAROSCOPIC CHOLECYSTECTOMY  June 2010   LEFT HEART CATHETERIZATION WITH CORONARY ANGIOGRAM N/A 09/07/2012   Procedure: LEFT HEART CATHETERIZATION WITH CORONARY ANGIOGRAM;  Surgeon: Troy Sine, MD;  Location[facility] CATH [facility];  Service: Cardiovascular;  Laterality: N/A;   LEFT HEART CATHETERIZATION WITH CORONARY/GRAFT ANGIOGRAM N/A 07/24/2014   Procedure: LEFT HEART CATHETERIZATION WITH Beatrix Fetters;  Surgeon: Troy Sine, MD; LAD 70%, 50% ISR, mLAD 99%, CFX 30%, patent stent, RCA patent, LIMA-LAD occluded, SVG-OM 1 patent, EF 50-55%   LEXISCAN STRESS TEST  09/06/2012   LOWER ARTERIAL DOPPLER  03/25/2012   FGT CIA 0-49%; RGT CFA AND SFA AT STENT 0-49%; RGT DISTAL SFA = <50%; LFT. EIA  50-69%; LFT. CFA 0-49% ;;LFT. SFA  AT STENT NORMAL PATENCY   LOWER EXTREMITY ANGIOGRAPHY N/A 09/05/2019   Procedure: LOWER EXTREMITY ANGIOGRAPHY;  Surgeon: Adrian Prows, MD;  Location: [place] [facility];  Service: Cardiovascular;  Laterality: N/A;   NM MYOVIEW LTD  05/06/2012   EF -STUDY NOT GATED, NORMAL STRESS NUCLEAR ,NORMAL LV FUNCTION   PATCH ANGIOPLASTY Right 12/19/2015   Procedure: PATCH ANGIOPLASTY RIGHT FEMORAL ARTERY;  Surgeon: Serafina Mitchell, MD;  Location: [place];  Service: Vascular;  Laterality: Right;   PERCUTANEOUS CORONARY STENT INTERVENTION (PCI-S) Right 07/24/2014    Procedure: PERCUTANEOUS CORONARY STENT INTERVENTION (PCI-S);  Surgeon: Troy Sine, MD; ostial LAD PTCA with angioscope scoring balloon, 3.0 x 34 mm Resolute DES extending from the ostium to the mid LAD, covering all significant stenoses    PERIPHERAL VASCULAR CATHETERIZATION N/A 09/26/2015   Procedure: Abdominal Aortogram w/Lower Extremity;  Surgeon: Lorretta Harp, MD;  Location: [place] CV LAB;  Service: Cardiovascular;  Laterality: N/A;   PERIPHERAL VASCULAR CATHETERIZATION Right 02/20/2016   Procedure: Peripheral Vascular Atherectomy;  Surgeon: Lorretta Harp, MD;  Location: [place] [facility];  Service: Cardiovascular;  Laterality: Right;  SFA   PERIPHERAL VASCULAR CATHETERIZATION Right 02/20/2016   Procedure: Peripheral Vascular Balloon Angioplasty;  Surgeon: Lorretta Harp, MD;  Location: [place] [facility];  Service: Cardiovascular;  Laterality: Right;  SFA   PERIPHERAL VASCULAR INTERVENTION  09/05/2019   Procedure: PERIPHERAL VASCULAR INTERVENTION;  Surgeon:  Adrian Prows, MD;  Location: [place] [facility];  Service: Cardiovascular;;  left common iliac   TONSILLECTOMY  1950's   UPPER GASTROINTESTINAL ENDOSCOPY     Family History  Problem Relation Age of Onset   Coronary artery disease Father 48   Heart attack Father    Coronary artery disease Brother 51       CABG in 2000   Deep vein thrombosis Brother    Colon cancer Neg Hx    Esophageal cancer Neg Hx    Rectal cancer Neg Hx    Stomach cancer Neg Hx     Social History   Tobacco Use   Smoking status: Former    Packs/day: 0.50    Years: 56.00    Pack years: 28.00    Types: Cigarettes    Quit date: 08/23/2020    Years since quitting: 0.3   Smokeless tobacco: Current    Types: Chew  Substance Use Topics   Alcohol use: Yes    Alcohol/week: 1.0 standard drink    Types: 1 Standard drinks or equivalent per week    Comment: 09/26/2015 "varies; might have a few drinks q 2 months"   Marital status: Married   ROS    Review of Systems  Constitutional: Positive for malaise/fatigue.  Cardiovascular:  Positive for chest pain and dyspnea on exertion. Negative for claudication, leg swelling and syncope.  Musculoskeletal:  Positive for arthritis, back pain and joint pain (left toes pain and swelling).  Gastrointestinal:  Negative for melena.  Objective  Blood pressure 130/60, pulse 60, temperature 98.3 F (36.8 C), temperature source Temporal, resp. rate 16, height '5\' 9"'$  (1.753 m), weight 199 lb 6.4 oz (90.4 kg), SpO2 96 %.  Vitals with BMI 01/01/2021 12/16/2020 09/10/2020  Height '5\' 9"'$  - '5\' 9"'$   Weight 199 lbs 6 oz 196 lbs 193 lbs 10 oz  BMI [id number]  Systolic [id number]  Diastolic 60 57 50  Pulse 60 41 50     Physical Exam Constitutional:      Appearance: He is well-developed.  Neck:     Vascular: Carotid bruit (bilateral) present. No JVD.  Cardiovascular:     Rate and Rhythm: Regular rhythm. Bradycardia present.     Pulses:          Carotid pulses are 2+ on the right side with bruit and 2+ on the left side.      Femoral pulses are 2+ on the right side with bruit and 2+ on the left side with bruit.      Popliteal pulses are 1+ on the right side and 2+ on the left side.       Dorsalis pedis pulses are 1+ on the right side and 1+ on the left side.       Posterior tibial pulses are 0 on the right side and 2+ on the left side.     Heart sounds: Normal heart sounds.  Pulmonary:     Breath sounds: Rales (bialteral left base worse than right. Diffuse scattered) present.  Abdominal:     General: Bowel sounds are normal.     Palpations: Abdomen is soft.  Musculoskeletal:        General: Tenderness (right big toe) present. No swelling.   Laboratory examination:   Recent Labs    02/14/20 1159 06/17/20 1432 08/26/20 1347 12/16/20 0909  NA 138 140 142 142  K 4.0 4.3 4.3 4.2  CL 109 106 104 106  CO2 22 27  22 29  GLUCOSE 96 107* 90 147*  BUN '21 22 24 15  '$ CREATININE 0.99 1.20 1.15 1.24   CALCIUM 8.9 9.9 9.1 9.5  GFRNONAA >60 >60  --  >60  GFRAA >60  --   --   --    estimated creatinine clearance is 57.2 mL/min (by C-G formula based on SCr of 1.24 mg/dL).  CMP Latest Ref Rng & Units 12/16/2020 08/26/2020 06/17/2020  Glucose 70 - 99 mg/dL 147(H) 90 107(H)  BUN 8 - 23 mg/dL '15 24 22  '$ Creatinine 0.61 - 1.24 mg/dL 1.24 1.15 1.20  Sodium 135 - 145 mmol/L 142 142 140  Potassium 3.5 - 5.1 mmol/L 4.2 4.3 4.3  Chloride 98 - 111 mmol/L 106 104 106  CO2 22 - 32 mmol/L '29 22 27  '$ Calcium 8.9 - 10.3 mg/dL 9.5 9.1 9.9  Total Protein 6.5 - 8.1 g/dL 6.7 - 6.9  Total Bilirubin 0.3 - 1.2 mg/dL 0.5 - 0.5  Alkaline Phos 38 - 126 U/L 74 - 80  AST 15 - 41 U/L 19 - 21  ALT 0 - 44 U/L 14 - 16   CBC Latest Ref Rng & Units 12/16/2020 06/17/2020 02/14/2020  WBC 4.0 - 10.5 K/uL 9.3 9.3 7.4  Hemoglobin 13.0 - 17.0 g/dL 13.5 15.3 12.9(L)  Hematocrit 39.0 - 52.0 % 41.5 46.6 39.9  Platelets 150 - 400 K/uL 269 197 250   Last lipids Lab Results  Component Value Date   CHOL 154 07/11/2020   HDL 40 07/11/2020   LDLCALC 82 07/11/2020   TRIG 186 (H) 07/11/2020   CHOLHDL 2.9 02/07/2018   HEMOGLOBIN A1C Lab Results  Component Value Date   HGBA1C 6.3 (H) 02/14/2020   MPG 134.11 02/14/2020   TSH No results for input(s): TSH in the last 8760 hours.  Allergies   Allergies  Allergen Reactions   Other Anaphylaxis, Shortness Of Breath and Swelling    [place] nuts- "will kill me"   Clarithromycin Nausea Only   Fenofibrate Micronized Other (See Comments)    Caused weakness/lethargy   Metoprolol Other (See Comments)    Fatigue and malaise   Tricor [Fenofibrate] Other (See Comments)    Tired/weak   Fish Oil Rash   Levofloxacin Other (See Comments)    Doesn't remember   Meloxicam Other (See Comments)    Achy, fatigue   Metaxalone Other (See Comments)    Doesn't remember   Niaspan [Niacin] Other (See Comments)    Doesn't remember   Sulfadiazine Hives and Rash    Medications Prior to Visit:    Outpatient Medications Prior to Visit  Medication Sig Dispense Refill   acetaminophen (TYLENOL) 500 MG tablet Take 500-1,000 mg by mouth every 6 (six) hours as needed for moderate pain.      Ascorbic Acid (VITAMIN C) 1000 MG tablet Take 1,000 mg by mouth in the morning and at bedtime.      BYSTOLIC 5 MG tablet TAKE 1/2 TABLET ONCE DAILY (Patient taking differently: Take 2.5 mg by mouth daily.) 30 tablet 6   cetirizine (ZYRTEC) 10 MG tablet Take 10 mg by mouth daily.     Cholecalciferol (VITAMIN D3) 50 MCG (2000 UT) TABS Take 2,000 Units by mouth daily.     cyclobenzaprine (FLEXERIL) 10 MG tablet Take 10 mg by mouth 3 (three) times daily as needed for muscle spasms.      levothyroxine (SYNTHROID, LEVOTHROID) 25 MCG tablet Take 25 mcg by mouth daily before breakfast.  loperamide (IMODIUM A-D) 2 MG tablet Take 2 mg by mouth 4 (four) times daily as needed for diarrhea or loose stools.     losartan (COZAAR) 100 MG tablet Take 100 mg by mouth daily.     Melatonin 12 MG TABS Take 12 mg by mouth at bedtime.      nitroGLYCERIN (NITROSTAT) 0.4 MG SL tablet PLACE 1 TABLET UNDER THE TONGUE, MAY REPEAT EVERY 5 MINUTES MAX 3 TABLETS 25 tablet 11   OXYGEN Inhale 2 L into the lungs at bedtime.     pantoprazole (PROTONIX) 40 MG tablet Take 1 tablet (40 mg total) by mouth 2 (two) times daily. (Patient taking differently: Take 40 mg by mouth at bedtime.) 60 tablet 0   Probiotic Product (PROBIOTIC PO) Take 1 capsule by mouth daily.     rivaroxaban (XARELTO) 20 MG TABS tablet Take 1 tablet (20 mg total) by mouth daily with supper. 90 tablet 3   rosuvastatin (CRESTOR) 20 MG tablet TAKE ONE TABLET BY MOUTH EVERY DAY (Patient taking differently: Take 10 mg by mouth daily.) 90 tablet 3   zolpidem (AMBIEN) 10 MG tablet Take 10 mg by mouth at bedtime as needed for sleep.      aspirin EC 81 MG tablet Take 81 mg by mouth at bedtime.      Budeson-Glycopyrrol-Formoterol (BREZTRI AEROSPHERE) 160-9-4.8 MCG/ACT AERO  Inhale 2 puffs into the lungs 2 (two) times daily. 5.9 g 0   Budeson-Glycopyrrol-Formoterol (BREZTRI AEROSPHERE) 160-9-4.8 MCG/ACT AERO Inhale 2 puffs then rinse mouth twice daily 10.7 g 12   cholestyramine light (PREVALITE) 4 g packet Take 4 g by mouth every evening.      DULoxetine (CYMBALTA) 30 MG capsule Take 1 capsule (30 mg total) by mouth daily. 30 capsule 3   Zinc 50 MG TABS Take 50 mg by mouth in the morning and at bedtime.     No facility-administered medications prior to visit.   Final Medications at End of Visit    Current Meds  Medication Sig   acetaminophen (TYLENOL) 500 MG tablet Take 500-1,000 mg by mouth every 6 (six) hours as needed for moderate pain.    Ascorbic Acid (VITAMIN C) 1000 MG tablet Take 1,000 mg by mouth in the morning and at bedtime.    BYSTOLIC 5 MG tablet TAKE 1/2 TABLET ONCE DAILY (Patient taking differently: Take 2.5 mg by mouth daily.)   cetirizine (ZYRTEC) 10 MG tablet Take 10 mg by mouth daily.   Cholecalciferol (VITAMIN D3) 50 MCG (2000 UT) TABS Take 2,000 Units by mouth daily.   cyclobenzaprine (FLEXERIL) 10 MG tablet Take 10 mg by mouth 3 (three) times daily as needed for muscle spasms.    levothyroxine (SYNTHROID, LEVOTHROID) 25 MCG tablet Take 25 mcg by mouth daily before breakfast.    loperamide (IMODIUM A-D) 2 MG tablet Take 2 mg by mouth 4 (four) times daily as needed for diarrhea or loose stools.   losartan (COZAAR) 100 MG tablet Take 100 mg by mouth daily.   Melatonin 12 MG TABS Take 12 mg by mouth at bedtime.    nitroGLYCERIN (NITROSTAT) 0.4 MG SL tablet PLACE 1 TABLET UNDER THE TONGUE, MAY REPEAT EVERY 5 MINUTES MAX 3 TABLETS   OXYGEN Inhale 2 L into the lungs at bedtime.   pantoprazole (PROTONIX) 40 MG tablet Take 1 tablet (40 mg total) by mouth 2 (two) times daily. (Patient taking differently: Take 40 mg by mouth at bedtime.)   Probiotic Product (PROBIOTIC PO) Take 1 capsule by  mouth daily.   rivaroxaban (XARELTO) 20 MG TABS tablet Take 1  tablet (20 mg total) by mouth daily with supper.   rosuvastatin (CRESTOR) 20 MG tablet TAKE ONE TABLET BY MOUTH EVERY DAY (Patient taking differently: Take 10 mg by mouth daily.)   zolpidem (AMBIEN) 10 MG tablet Take 10 mg by mouth at bedtime as needed for sleep.    [DISCONTINUED] aspirin EC 81 MG tablet Take 81 mg by mouth at bedtime.     Radiology:   CT angio chest 12/16/2020:  Coronary atherosclerosis SP CABG. Mediastinum normal. Lungs show airway thickening bilaterally with intermittent narrowing.  Mucoid impaction in the right lower lobe where there is streaky opacity and volume loss.  Otherwise no significant abnormality.  Cardiac Studies:   Coronary angiogram and stenting to his LAD and RCA  in 1997, 1998, and in 2004 to his proximal left circumflex coronary artery.  Due to recurrent restenosis underwent CABG on 09/12/2012 with off-pump LIMA to LAD and SVG to OM1.  CABG  by Dr. Roxan Hockey on 09/12/2012 with an off-pump LIMA to LAD (Occluded by angoigram in 2016) and SVG to circumflex marginal 1 vessel.  Coronary angiogram 07/24/2014: High-grade stenosis to the proximal LAD, patent circumflex stent, patent SVG to OM1 however LIMA to LAD occluded.  Successful stenting to proximal LAD with 3.0 x 34 mm resolute DES, postdilated to 3.4 mm.  Peripheral arteriogram: Stenting to his left SFA in 2004 and right SFA in 2008.  Stenting of left external iliac artery, right common femoral artery endarterectomy and patch angioplasty in August 2017 with staged right SFA atherectomy.  Abdominal aortic and iliac duplex 11/03/2018: Stenosis: +--------------------+-------------+---------------+  Location            Stenosis     Stent            +--------------------+-------------+---------------+  Right Common Iliac  <50% stenosis                 +--------------------+-------------+---------------+  Left Common Iliac   <50% stenosis                  +--------------------+-------------+---------------+  Right External Iliac<50% stenosis                 +--------------------+-------------+---------------+  Left External Iliac              50-99% stenosis  +--------------------+-------------+---------------+   Lower extremity arterial duplex 11/02/2018: Right: Atherosclerosis throughout the right lower extremity without focal stenosis.  Patent mid-distal SFA stent with 1-49% stenosis.  Left: Atherosclerosis throughout the left lower extremity.  Patent distal SFA stent with 1-49% stenosis.   Carotid artery duplex  08/07/2019:  Stenosis in the right internal carotid artery (16-49%). Stenosis in the  right external carotid artery (<50%).  Stenosis in the left internal carotid artery (16-49%). Stenosis in the  left common carotid artery (<50%).  Antegrade right vertebral artery flow. Antegrade left vertebral artery  flow.  Follow up in one year is appropriate if clinically indicated.   Echocardiogram 08/07/2019:  Normal LV systolic function with visual EF 50-55%. Left ventricle cavity  is normal in size. Normal global wall motion. No obvious regional wall  motion abnormalities. Mild concentric hypertrophy of the left ventricle.  Indeterminate diastolic filling pattern, normal LAP. Calculated EF 50%.  Left atrial cavity is severely dilated.  Visually, right atrial cavity is mildly dilated.  Mild mitral regurgitation.  Moderate tricuspid regurgitation. No evidence of pulmonary hypertension. RVSP measures 33 mmHg.  Mild pulmonic regurgitation.  IVC is dilated with a respiratory response of >50%.  No significant change compared to previous study 05/25/2018.   Lexiscan (Walking with mod Bruce)Tetrofosmin Stress Test  08/07/2019: Nondiagnostic ECG stress. Perfusion imaging study is abnormal.  Mild inferior, inferolateral small sized defect consistent with ischemia. Left ventricular systolic function is mildly depressed, LVEF  calculated at 49% with inferior hypokinesis. Compared to 10/22/2016 report- normal study.  Intermediate risk study.   Peripheral arteriogram 09/05/19: Abdominal aortogram revealed presence of 2 renal arteries 1 on either sides, widely patent.  Mild atherosclerotic changes abdominal aorta noted.  Bilateral aortoiliac bifurcation was widely patent with mild calcification.  There was diffuse moderate calcification noted throughout the entire iliac artery system.  Bilateral internal iliac arteries are occluded.  There is a old left external iliac artery stent which has a 40 to 50% in-stent restenosis focally without pressure gradient.  Left common iliac artery has a 80% stenosis with a 20 to 30 mm pressure gradient. Left common femoral artery is very long and profunda bifurcation happens distal than normal, the left common femoral artery has a calcific 80 to 90% stenosis, brisk flow in the left SFA and old left SFA stent is widely patent.  Below the left knee there is two-vessel runoff with occluded left PT.   Right common femoral artery patch angioplasty site is widely patent.  Right SFA shows mild diffuse disease with moderate amount of calcification.  Previously placed right mid SFA stent is widely patent.  There is three-vessel runoff below the right knee.  Intervention data: 1. Left common femoral artery: PTA with Shockwave lithotripsy with a 7.0 x 60 mm shockwave balloon followed by drug-coated balloon angioplasty with a 8 mm x 40 mm Lutonix angioplasty, stenosis reduced from 85% to less than 10% with brisk flow. 2. PTA and self-expanding 10.0 x 40 mm absolute Pro stent to the left common iliac artery followed by post angioplasty with a 9.0 x 40 mm Mustang at 8 atmospheric pressure for 60 seconds, stenosis reduced from 80% to 0%.  There was also left external iliac artery old stent that had a 50% stenosis was also stented with the same self-expanding stent overlapped followed by balloon angioplasty.   Overall no residual stenosis.  ABI 09/26/2019:  This exam reveals mildly decreased perfusion of the right lower extremity, noted at the anterior tibial artery level (ABI 0.83) and mildly decreased  perfusion of the left lower extremity, noted at the anterior tibial artery level (ABI 0.86). Compared to 08/31/2019, bilateral ABI 0.81. (H/O Left CIA stent and left CFA angioplasty).  Right lower extremity venous duplex 12/16/2020: Partially-occluding filling defect within the right tibioperoneal trunk, posterior tibial and peroneal veins, with evidence of partial recanalization (Chronic DVT). Findings likely consistent with chronic right lower extremity DVT.   EKG    EKG 07/05/2020: Sinus bradycardia at the rate of 55 bpm, left atrial enlargement, normal axis.  Early repolarization.  No significant change from 07/26/2019    Assessment     ICD-10-CM   1. Coronary artery disease of native artery of native heart with stable angina pectoris ([place])  I25.118 EKG 12-Lead    2. COPD mixed type ([place])  J44.9     3. Peripheral artery disease  I73.9     4. Essential hypertension  I10     5. Hypercholesteremia  E78.00 Lipid Panel With LDL/HDL Ratio    6. Asymptomatic bilateral carotid artery stenosis  I65.23 PCV CAROTID DUPLEX (BILATERAL)    7. Gouty arthritis of  right great toe  M10.9 Uric acid      No orders of the defined types were placed in this encounter.   Medications Discontinued During This Encounter  Medication Reason   Budeson-Glycopyrrol-Formoterol (BREZTRI AEROSPHERE) 160-9-4.8 MCG/ACT AERO Error   Budeson-Glycopyrrol-Formoterol (BREZTRI AEROSPHERE) 160-9-4.8 MCG/ACT AERO Error   cholestyramine light (PREVALITE) 4 g packet Error   DULoxetine (CYMBALTA) 30 MG capsule Error   Zinc 50 MG TABS Error   aspirin EC 81 MG tablet Discontinued by provider    Orders Placed This Encounter  Procedures   Lipid Panel With LDL/HDL Ratio   Uric acid   EKG 12-Lead    Recommendations:    Brian Brock  is a 75 y.o. Caucasian male with CAD with multiple angioplasties over the years, due to restenosis underwent CABG on 09/12/2012 but had recurrence of angina and LIMA occluded needing LAD stenting in 2016.  Past medical history significant for peripheral artery disease with left iliac artery and left SFA stenting in 2014 and drug-coated balloon angioplasty followed by DCB on 09/05/2019 for left common femoral artery.   Past medical history also significant for hypertension, hyperglycemia, hyperlipidemia, hypothyroidism, spontaneous DVT and pulmonary embolism in January 2020 and has been on Xarelto since then.  He has chronic dyspnea on exertion and COPD with quit tobacco use  in Apr 2022, nocturnal hypoxemia on home O2.  On home oxygen at night has improved his energy level, continues to have fatigue in general.  Dyspnea is remained stable, he has quit smoking since April 2022 and has remained abstinent.  With regards to atypical chest pain, he has been exercising at least 3 times a week in the gym without any chest pain or significant claudication.  Hence do not think he needs any further evaluation.  Advised him to continue to exert himself.  I will discontinue aspirin as he is on Xarelto chronically to reduce risk of bleeding.  He has developed right greater toe gouty arthritis.  Will obtain uric acid level and forwarded to his PCP.  With regard to hyperlipidemia, LDL >70  6 months ago, will repeat lipid profile testing and consider addition of Zetia.  I would like to see him back in 6 months.  He will continue carotid surveillance.   Adrian Prows, MD, [facility] 01/02/2021, 6:07 PM Office: [phone] Fax: [phone] Pager: [phone]

## 2021-01-02 ENCOUNTER — Other Ambulatory Visit: Payer: Self-pay | Admitting: Cardiology

## 2021-01-03 ENCOUNTER — Ambulatory Visit: Payer: Medicare Other | Admitting: Cardiology

## 2021-01-03 LAB — LIPID PANEL WITH LDL/HDL RATIO
Cholesterol, Total: 123 mg/dL (ref 100–199)
HDL: 36 mg/dL — ABNORMAL LOW (ref >39)
LDL Chol Calc (NIH): 67 mg/dL (ref 0–99)
LDL/HDL Ratio: 1.9 ratio (ref 0.0–3.6)
Triglycerides: 106 mg/dL (ref 0–149)
VLDL Cholesterol Cal: 20 mg/dL (ref 5–40)

## 2021-01-03 LAB — URIC ACID: Uric Acid: 4.9 mg/dL (ref 3.8–8.4)

## 2021-01-07 ENCOUNTER — Other Ambulatory Visit: Payer: Self-pay

## 2021-01-07 ENCOUNTER — Ambulatory Visit: Payer: Medicare Other

## 2021-01-07 DIAGNOSIS — I6523 Occlusion and stenosis of bilateral carotid arteries: Secondary | ICD-10-CM

## 2021-01-13 NOTE — Telephone Encounter (Signed)
From pt

## 2021-01-29 ENCOUNTER — Encounter: Payer: Self-pay | Admitting: Gastroenterology

## 2021-02-17 ENCOUNTER — Telehealth: Payer: Self-pay | Admitting: Internal Medicine

## 2021-02-17 DIAGNOSIS — J449 Chronic obstructive pulmonary disease, unspecified: Secondary | ICD-10-CM

## 2021-02-17 MED ORDER — ALBUTEROL SULFATE (2.5 MG/3ML) 0.083% IN NEBU: 2.5000 mg | INHALATION_SOLUTION | Freq: Four times a day (QID) | RESPIRATORY_TRACT | 6 refills | Status: DC | PRN

## 2021-02-17 NOTE — Telephone Encounter (Signed)
Called and spoke with Anderson Malta with [facility] and she stated that the pt needed the albuterol nebulizer medication sent into the [facility] and he would also need to have the supplies as well.  Order has been placed for this as well. Nothing further is needed.

## 2021-02-19 ENCOUNTER — Ambulatory Visit (INDEPENDENT_AMBULATORY_CARE_PROVIDER_SITE_OTHER): Payer: Medicare Other | Admitting: Gastroenterology

## 2021-02-19 ENCOUNTER — Encounter: Payer: Self-pay | Admitting: Gastroenterology

## 2021-02-19 ENCOUNTER — Other Ambulatory Visit: Payer: Self-pay

## 2021-02-19 VITALS — BP 142/78 | HR 49 | Ht 69.0 in | Wt 194.5 lb

## 2021-02-19 DIAGNOSIS — R140 Abdominal distension (gaseous): Secondary | ICD-10-CM | POA: Diagnosis not present

## 2021-02-19 DIAGNOSIS — K219 Gastro-esophageal reflux disease without esophagitis: Secondary | ICD-10-CM

## 2021-02-19 DIAGNOSIS — R1084 Generalized abdominal pain: Secondary | ICD-10-CM | POA: Diagnosis not present

## 2021-02-19 DIAGNOSIS — K582 Mixed irritable bowel syndrome: Secondary | ICD-10-CM | POA: Diagnosis not present

## 2021-02-19 DIAGNOSIS — R351 Nocturia: Secondary | ICD-10-CM

## 2021-02-19 DIAGNOSIS — K449 Diaphragmatic hernia without obstruction or gangrene: Secondary | ICD-10-CM

## 2021-02-19 NOTE — Progress Notes (Signed)
IMPRESSION and PLAN:    #1. GERD with small HH on CT (none on EGD)  #2. IBS with Alt diarrhea and constipation. Heme -ve stools (on Xarelto) with Nl CBC. Neg EGD 11/04/2017.Neg colon 10/2016.  #3. Abdo pain  Plan: -Continue protonix 40mg  po qd.   -CTA abdo/pelvis r/o vascular pathology. -CBC, CMP, lipase -Restora 1 tab po QD -Discussed in detail with the patient. -FU if still with problems.      HPI:    Chief Complaint:   Brian Brock is a 75 y.o. 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  Diarrhea is better with imodium.  For constipation, occasionally he has to take milk of magnesia.  No prolems with milk or salads.  No food intolerance.  Lately having postprandial abdominal bloating and abdominal pain which lasts for about 45 minutes to 1 hour, at times occurring right after eating.  At times, abdominal pain is delayed for 2 to 3 hours after eating.  This only partially gets relieved with defecation.  No weight loss or loss of appetite.  Had heme-negative stools 02/03/2021.  Fortunately, he has quit smoking April 2022.  No nausea or vomiting.  Wt Readings from Last 3 Encounters:  02/19/21 194 lb 8 oz (88.2 kg)  01/01/21 199 lb 6.4 oz (90.4 kg)  12/16/20 196 lb (88.9 kg)     Past GI procedures:  Colonoscopy 10/2016: -Moderate sigmoid diverticulosis -Internal hemorrhoids -Negative random colon biopsies for microscopic colitis -Otherwise normal to TI. -Repeat only if with any new problems.  EGD 11/04/2017: neg. Performed while on Plavix at that time.  Past Medical History:  Diagnosis Date   AF (atrial fibrillation) (HCC)    happened after CABG, has not had it since   Allergic rhinitis    Allergy    Anginal pain ([place]) 1997   Arthritis     "lower back" (09/26/2015)   Childhood asthma    Chronic bronchitis (HCC)    Yearly   COPD (chronic obstructive pulmonary disease) (HCC)    Coronary artery disease    Multiple stents   Dyslipidemia    Dysrhythmia    Femoral hernia of right side 12/28/2016   GERD (gastroesophageal reflux disease)    History of hiatal hernia    Hyperlipemia    Hypertension    Hypothyroidism    Irritable bowel syndrome    Myocardial infarct [facility]) 1997   "mild"   PE (pulmonary thromboembolism) ([place]) 05/2018   Peripheral vascular disease ([place])    prior stenting   Pre-diabetes    Restless legs     Current Outpatient Medications  Medication Sig Dispense Refill   acetaminophen (TYLENOL) 500 MG tablet Take 500-1,000 mg by mouth every 6 (six) hours as needed for moderate pain.      albuterol (PROVENTIL) (2.5 MG/3ML) 0.083% nebulizer solution Take 3 mLs (2.5 mg total) by nebulization every 6 (six) hours as needed for wheezing or shortness of breath. 360 mL 6   Ascorbic Acid (VITAMIN C) 1000 MG tablet Take 1,000 mg by mouth in the morning and at bedtime.      BYSTOLIC 5 MG tablet TAKE 1/2 TABLET ONCE DAILY (Patient taking differently: Take 2.5 mg by mouth daily.) 30 tablet 6   cetirizine (ZYRTEC) 10 MG tablet Take  10 mg by mouth daily.     Cholecalciferol (VITAMIN D3) 50 MCG (2000 UT) TABS Take 2,000 Units by mouth daily.     cyclobenzaprine (FLEXERIL) 10 MG tablet Take 10 mg by mouth 3 (three) times daily as needed for muscle spasms.      levothyroxine (SYNTHROID, LEVOTHROID) 25 MCG tablet Take 25 mcg by mouth daily before breakfast.      loperamide (IMODIUM A-D) 2 MG tablet Take 2 mg by mouth 4 (four) times daily as needed for diarrhea or loose stools.     losartan (COZAAR) 100 MG tablet Take 100 mg by mouth daily.     Melatonin 12 MG TABS Take 12 mg by mouth at bedtime.      nitroGLYCERIN (NITROSTAT) 0.4 MG SL tablet PLACE 1 TABLET UNDER THE TONGUE, MAY REPEAT EVERY 5 MINUTES MAX 3 TABLETS 25 tablet 11    OXYGEN Inhale 2 L into the lungs at bedtime.     pantoprazole (PROTONIX) 40 MG tablet Take 1 tablet (40 mg total) by mouth 2 (two) times daily. (Patient taking differently: Take 40 mg by mouth at bedtime.) 60 tablet 0   Probiotic Product (PROBIOTIC PO) Take 1 capsule by mouth daily.     rivaroxaban (XARELTO) 20 MG TABS tablet Take 1 tablet (20 mg total) by mouth daily with supper. 90 tablet 3   rosuvastatin (CRESTOR) 20 MG tablet TAKE ONE TABLET BY MOUTH EVERY DAY (Patient taking differently: Take 10 mg by mouth daily.) 90 tablet 3   zolpidem (AMBIEN) 10 MG tablet Take 10 mg by mouth at bedtime as needed for sleep.      No current facility-administered medications for this visit.    Past Surgical History:  Procedure Laterality Date   CARDIAC CATHETERIZATION  2004   had remote tandem stents in 1997 and 1998 for LAD lesions, RCA stent in 1997 , midprto=ion, and a proximal RCA  SENT IN 1999, all DES; some were IVIS  guide.   CARDIAC CATHETERIZATION  09/07/2012   demonstrated severe 2- vessel disease,patent LAD,patient circ and patent right stents, but 90% ostial LAD whic was new.   CAROTID DOPPLER  100/24/11   RGT BULB and PROXIMAL ICA 0-49%; LFT. ICA  0-49 ; RGT SUBCLAVIAN ARTERY < 50% ; LFT SUBCLAVIAN normal patency; RGT  and LFT  VERTEBRAL ARTERIES   COLONOSCOPY  11/06/2016   Diverticulosis of colon. Internal hemorrhoids.    CORONARY ANGIOPLASTY     CORONARY ANGIOPLASTY WITH STENT PLACEMENT  '97, '98, '04, '06, '08, '10   seven cardiac stents   CORONARY ARTERY BYPASS GRAFT N/A 09/12/2012   Procedure: OFF PUMP CORONARY ARTERY BYPASS GRAFTING (CABG);  Surgeon: Melrose Nakayama, MD;  Location: [place];  Service: Open Heart Surgery;  Laterality: N/A;  Times two using left internal mammary artery and endoscopically harvested right saphenous vein  ;LIMA-LAD and SVG -OM1   DOPPLER ECHOCARDIOGRAPHY  11/23/2012,09/30/2010    LV EF 50-55%----09/30/2010-LV EF = >55%   ENDARTERECTOMY FEMORAL Right  12/19/2015   Procedure: ENDARTERECTOMY RIGHT FEMORAL ARTERY;  Surgeon: Serafina Mitchell, MD;  Location: [place] [facility];  Service: Vascular;  Laterality: Right;   event monitor  11/04/2012-11/17/2012   pt denies this hx on 07/24/2014   EYE SURGERY Bilateral    cataract   FEMORAL ARTERY STENT Bilateral 7/06  8/08   Lt SFA '06, Rt SFA 8/08   HERNIA REPAIR  10/2008   "repaired w/gallbladder OR"   HERNIA REPAIR  07/2016   INGUINAL HERNIA REPAIR  Right 12/28/2016   Procedure: OPEN REPAIR RIGHT GROIN HERNIA WITH MESH;  Surgeon: Fanny Skates, MD;  Location: [place];  Service: General;  Laterality: Right;   INGUINAL HERNIA REPAIR Right 02/21/2020   Procedure: LAPAROSCOPIC RIGHT INGUINAL HERNIA REPAIR WITH MESH;  Surgeon: Ralene Ok, MD;  Location: [place];  Service: General;  Laterality: Right;   INTRAVASCULAR LITHOTRIPSY  09/05/2019   Procedure: INTRAVASCULAR LITHOTRIPSY;  Surgeon: Adrian Prows, MD;  Location: [place] [facility];  Service: Cardiovascular;;  left common femoral   LAPAROSCOPIC CHOLECYSTECTOMY  June 2010   LEFT HEART CATHETERIZATION WITH CORONARY ANGIOGRAM N/A 09/07/2012   Procedure: LEFT HEART CATHETERIZATION WITH CORONARY ANGIOGRAM;  Surgeon: Troy Sine, MD;  Location: [facility];  Service: Cardiovascular;  Laterality: N/A;   LEFT HEART CATHETERIZATION WITH CORONARY/GRAFT ANGIOGRAM N/A 07/24/2014   Procedure: LEFT HEART CATHETERIZATION WITH Beatrix Fetters;  Surgeon: Troy Sine, MD; LAD 70%, 50% ISR, mLAD 99%, CFX 30%, patent stent, RCA patent, LIMA-LAD occluded, SVG-OM 1 patent, EF 50-55%   LEXISCAN STRESS TEST  09/06/2012   LOWER ARTERIAL DOPPLER  03/25/2012   FGT CIA 0-49%; RGT CFA AND SFA AT STENT 0-49%; RGT DISTAL SFA = <50%; LFT. EIA  50-69%; LFT. CFA 0-49% ;;LFT. SFA  AT STENT NORMAL PATENCY   LOWER EXTREMITY ANGIOGRAPHY N/A 09/05/2019   Procedure: LOWER EXTREMITY ANGIOGRAPHY;  Surgeon: Adrian Prows, MD;  Location: [place] [facility];  Service: Cardiovascular;  Laterality: N/A;    NM MYOVIEW LTD  05/06/2012   EF -STUDY NOT GATED, NORMAL STRESS NUCLEAR ,NORMAL LV FUNCTION   PATCH ANGIOPLASTY Right 12/19/2015   Procedure: PATCH ANGIOPLASTY RIGHT FEMORAL ARTERY;  Surgeon: Serafina Mitchell, MD;  Location: [place];  Service: Vascular;  Laterality: Right;   PERCUTANEOUS CORONARY STENT INTERVENTION (PCI-S) Right 07/24/2014   Procedure: PERCUTANEOUS CORONARY STENT INTERVENTION (PCI-S);  Surgeon: Troy Sine, MD; ostial LAD PTCA with angioscope scoring balloon, 3.0 x 34 mm Resolute DES extending from the ostium to the mid LAD, covering all significant stenoses    PERIPHERAL VASCULAR CATHETERIZATION N/A 09/26/2015   Procedure: Abdominal Aortogram w/Lower Extremity;  Surgeon: Lorretta Harp, MD;  Location: [place] [facility];  Service: Cardiovascular;  Laterality: N/A;   PERIPHERAL VASCULAR CATHETERIZATION Right 02/20/2016   Procedure: Peripheral Vascular Atherectomy;  Surgeon: Lorretta Harp, MD;  Location: [place] [facility];  Service: Cardiovascular;  Laterality: Right;  SFA   PERIPHERAL VASCULAR CATHETERIZATION Right 02/20/2016   Procedure: Peripheral Vascular Balloon Angioplasty;  Surgeon: Lorretta Harp, MD;  Location: [place] [facility];  Service: Cardiovascular;  Laterality: Right;  SFA   PERIPHERAL VASCULAR INTERVENTION  09/05/2019   Procedure: PERIPHERAL VASCULAR INTERVENTION;  Surgeon: Adrian Prows, MD;  Location: [facility];  Service: Cardiovascular;;  left common iliac   TONSILLECTOMY  1950's   UPPER GASTROINTESTINAL ENDOSCOPY      Family History  Problem Relation Age of Onset   Coronary artery disease Father 24   Heart attack Father    Coronary artery disease Brother 5       CABG in 2000   Deep vein thrombosis Brother    Colon cancer Neg Hx    Esophageal cancer Neg Hx    Rectal cancer Neg Hx    Stomach cancer Neg Hx     Social History   Tobacco Use   Smoking status: Former    Packs/day: 0.50    Years: 56.00    Pack years: 28.00    Types:  Cigarettes    Quit date: 08/23/2020    Years since quitting: 0.4   Smokeless tobacco: Current    Types: Chew  Vaping Use   Vaping Use: Never used  Substance Use Topics   Alcohol use: Yes    Alcohol/week: 1.0 standard drink    Types: 1 Standard drinks or equivalent per week    Comment: 09/26/2015 "varies; might have a few drinks q 2 months"   Drug use: No    Allergies  Allergen Reactions   Other Anaphylaxis, Shortness Of Breath and Swelling    [place] nuts- "will kill me"   Clarithromycin Nausea Only   Fenofibrate Micronized Other (See Comments)    Caused weakness/lethargy   Metoprolol Other (See Comments)    Fatigue and malaise   Tricor [Fenofibrate] Other (See Comments)    Tired/weak   Fish Oil Rash   Levofloxacin Other (See Comments)    Doesn't remember   Meloxicam Other (See Comments)    Achy, fatigue   Metaxalone Other (See Comments)    Doesn't remember   Niaspan [Niacin] Other (See Comments)    Doesn't remember   Sulfadiazine Hives and Rash     Review of Systems: All systems reviewed and negative except where noted in HPI.    Physical Exam:     Pulse (!) 49   Ht 5\' 9"  (1.753 m)   Wt 194 lb 8 oz (88.2 kg)   SpO2 96%   BMI 28.72 kg/m  @WEIGHTLAST3 @ GENERAL:  Alert, oriented, cooperative, not in acute distress. PSYCH: :Pleasant, normal mood and affect. HEENT:  conjunctiva pink, mucous membranes moist, neck supple without masses. No jaundice. CARDIAC:  S1 S2 normal. No murmers. PULM: Normal respiratory effort, lungs CTA bilaterally, no wheezing. ABDOMEN: Inspection: No visible peristalsis, no abnormal pulsations, skin normal.  Palpation/percussion: Soft, nontender, nondistended, no rigidity, no abnormal dullness to percussion, no hepatosplenomegaly and no palpable abdominal masses.  Auscultation: Normal bowel sounds, no abdominal bruits. Rectal exam: Deferred SKIN:  turgor, no lesions seen. Musculoskeletal:  Normal muscle tone, normal strength. NEURO: Alert  and oriented x 3, no focal neurologic deficits.   Kaida Games,MD 02/19/2021, 10:34 AM   CC [name], MD

## 2021-02-19 NOTE — Patient Instructions (Addendum)
If you are age 75 or older, your body mass index should be between 23-30. Your Body mass index is 28.72 kg/m. If this is out of the aforementioned range listed, please consider follow up with your Primary Care Provider.  If you are age 64 or younger, your body mass index should be between 19-25. Your Body mass index is 28.72 kg/m. If this is out of the aformentioned range listed, please consider follow up with your Primary Care Provider.   __________________________________________________________  The [place] providers would like to encourage you to use MYCHART to communicate with providers for non-urgent requests or questions.  Due to long hold times on the telephone, sending your provider a message by MYCHART may be a faster and more efficient way to get a response.  Please allow 48 business hours for a response.  Please remember that this is for non-urgent requests.   Please go to the lab on the 2nd floor [address] before you leave the office today.   Please purchase the following medications over the counter and take as directed: Restora 1 tablet daily  You have been scheduled for a CTA scan of the abdomen and pelvis at [facility] ([address] [place], [place] [address] [address] Radiology).   You are scheduled on 02-27-2021 at 10:30am. You should arrive 15 minutes prior to your appointment time for registration. Please follow the written instructions below on the day of your exam:  1) Do not eat anything after 630am (4 hours prior to your test)  You may take any medications as prescribed with a small amount of water, if necessary. If you take any of the following medications: METFORMIN, GLUCOPHAGE, GLUCOVANCE, AVANDAMET, RIOMET, FORTAMET, ACTOPLUS MET, JANUMET, GLUMETZA or METAGLIP, you MAY be asked to HOLD this medication 48 hours AFTER the exam.  The purpose of you drinking the oral contrast is to aid in the visualization of your intestinal tract. The contrast  solution may cause some diarrhea. Depending on your individual set of symptoms, you may also receive an intravenous injection of x-ray contrast/dye. Plan on being at [facility] for 30 minutes or longer, depending on the type of exam you are having performed.  This test typically takes 30-45 minutes to complete.  If you have any questions regarding your exam or if you need to reschedule, you may call the CT department at [phone] between the hours of 8:00 am and 5:00 pm, Monday-Friday.  ________________________________________________________________________  Please call with any questions or concerns.  Thank you,  Dr. Rajesh Gupta  

## 2021-02-21 ENCOUNTER — Telehealth: Payer: Self-pay | Admitting: Internal Medicine

## 2021-02-21 MED ORDER — ALBUTEROL SULFATE (2.5 MG/3ML) 0.083% IN NEBU: INHALATION_SOLUTION | RESPIRATORY_TRACT | 6 refills | Status: DC

## 2021-02-21 NOTE — Telephone Encounter (Signed)
Called and spoke with [facility]. The RX needed to say "every 6hr as needed and for SOB" for albuterol.   Nothing further needed at time of call.

## 2021-02-21 NOTE — Addendum Note (Signed)
Addended by: Curlene Labrum E on: 02/21/2021 01:27 PM   Modules accepted: Orders

## 2021-02-21 NOTE — Addendum Note (Signed)
Addended by: Curlene Labrum E on: 02/21/2021 01:02 PM   Modules accepted: Orders

## 2021-02-21 NOTE — Addendum Note (Signed)
Addended by: Curlene Labrum E on: 02/21/2021 12:55 PM   Modules accepted: Orders

## 2021-02-24 ENCOUNTER — Other Ambulatory Visit: Payer: Self-pay

## 2021-02-24 ENCOUNTER — Other Ambulatory Visit (INDEPENDENT_AMBULATORY_CARE_PROVIDER_SITE_OTHER): Payer: Medicare Other

## 2021-02-24 DIAGNOSIS — R1084 Generalized abdominal pain: Secondary | ICD-10-CM

## 2021-02-24 DIAGNOSIS — K219 Gastro-esophageal reflux disease without esophagitis: Secondary | ICD-10-CM

## 2021-02-24 DIAGNOSIS — K449 Diaphragmatic hernia without obstruction or gangrene: Secondary | ICD-10-CM | POA: Diagnosis not present

## 2021-02-24 DIAGNOSIS — K582 Mixed irritable bowel syndrome: Secondary | ICD-10-CM | POA: Diagnosis not present

## 2021-02-24 DIAGNOSIS — R140 Abdominal distension (gaseous): Secondary | ICD-10-CM

## 2021-02-24 DIAGNOSIS — R351 Nocturia: Secondary | ICD-10-CM

## 2021-02-24 LAB — CBC WITH DIFFERENTIAL/PLATELET
Basophils Absolute: 0.1 10*3/uL (ref 0.0–0.1)
Basophils Relative: 0.6 % (ref 0.0–3.0)
Eosinophils Absolute: 0.4 10*3/uL (ref 0.0–0.7)
Eosinophils Relative: 4.4 % (ref 0.0–5.0)
HCT: 41.3 % (ref 39.0–52.0)
Hemoglobin: 13.5 g/dL (ref 13.0–17.0)
Lymphocytes Relative: 31.7 % (ref 12.0–46.0)
Lymphs Abs: 2.8 10*3/uL (ref 0.7–4.0)
MCHC: 32.7 g/dL (ref 30.0–36.0)
MCV: 86.3 fl (ref 78.0–100.0)
Monocytes Absolute: 0.7 10*3/uL (ref 0.1–1.0)
Monocytes Relative: 8.4 % (ref 3.0–12.0)
Neutro Abs: 4.8 10*3/uL (ref 1.4–7.7)
Neutrophils Relative %: 54.9 % (ref 43.0–77.0)
Platelets: 175 10*3/uL (ref 150.0–400.0)
RBC: 4.78 Mil/uL (ref 4.22–5.81)
RDW: 13.6 % (ref 11.5–15.5)
WBC: 8.7 10*3/uL (ref 4.0–10.5)

## 2021-02-24 LAB — COMPREHENSIVE METABOLIC PANEL
ALT: 15 U/L (ref 0–53)
AST: 20 U/L (ref 0–37)
Albumin: 4 g/dL (ref 3.5–5.2)
Alkaline Phosphatase: 78 U/L (ref 39–117)
BUN: 13 mg/dL (ref 6–23)
CO2: 29 mEq/L (ref 19–32)
Calcium: 9.1 mg/dL (ref 8.4–10.5)
Chloride: 105 mEq/L (ref 96–112)
Creatinine, Ser: 1.09 mg/dL (ref 0.40–1.50)
GFR: 66.54 mL/min (ref >60.00)
Glucose, Bld: 85 mg/dL (ref 70–99)
Potassium: 3.8 mEq/L (ref 3.5–5.1)
Sodium: 142 mEq/L (ref 135–145)
Total Bilirubin: 0.9 mg/dL (ref 0.2–1.2)
Total Protein: 6.4 g/dL (ref 6.0–8.3)

## 2021-02-24 LAB — LIPASE: Lipase: 24 U/L (ref 11.0–59.0)

## 2021-02-24 LAB — PSA: PSA: 0.41 ng/mL (ref 0.10–4.00)

## 2021-02-24 MED ORDER — ALBUTEROL SULFATE (2.5 MG/3ML) 0.083% IN NEBU: INHALATION_SOLUTION | RESPIRATORY_TRACT | 6 refills | Status: AC

## 2021-02-24 NOTE — Addendum Note (Signed)
Addended by: Lorretta Harp on: 02/24/2021 02:03 PM   Modules accepted: Orders

## 2021-02-24 NOTE — Telephone Encounter (Signed)
Rx has been fixed and resent back to [facility] for pt. Nothing further needed.

## 2021-02-27 ENCOUNTER — Encounter (HOSPITAL_BASED_OUTPATIENT_CLINIC_OR_DEPARTMENT_OTHER): Payer: Self-pay

## 2021-02-27 ENCOUNTER — Ambulatory Visit (HOSPITAL_BASED_OUTPATIENT_CLINIC_OR_DEPARTMENT_OTHER)
Admission: RE | Admit: 2021-02-27 | Discharge: 2021-02-27 | Disposition: A | Discharge status: Home / Self Care | Payer: Medicare Other | Source: Ambulatory Visit | Attending: Gastroenterology | Admitting: Gastroenterology

## 2021-02-27 ENCOUNTER — Telehealth: Payer: Self-pay

## 2021-02-27 ENCOUNTER — Other Ambulatory Visit: Payer: Self-pay

## 2021-02-27 DIAGNOSIS — R1084 Generalized abdominal pain: Secondary | ICD-10-CM | POA: Diagnosis present

## 2021-02-27 DIAGNOSIS — K449 Diaphragmatic hernia without obstruction or gangrene: Secondary | ICD-10-CM | POA: Diagnosis not present

## 2021-02-27 DIAGNOSIS — K219 Gastro-esophageal reflux disease without esophagitis: Secondary | ICD-10-CM

## 2021-02-27 DIAGNOSIS — K582 Mixed irritable bowel syndrome: Secondary | ICD-10-CM | POA: Diagnosis present

## 2021-02-27 DIAGNOSIS — R140 Abdominal distension (gaseous): Secondary | ICD-10-CM | POA: Diagnosis present

## 2021-02-27 MED ORDER — IOHEXOL 350 MG/ML SOLN
100.0000 mL | Freq: Once | INTRAVENOUS | Status: AC | PRN
  Administered 2021-02-27: 100 mL via INTRAVENOUS

## 2021-02-27 NOTE — Telephone Encounter (Signed)
Would like to keep him for at least 3 months. We can give him samples if we have some RG

## 2021-02-27 NOTE — Telephone Encounter (Signed)
Patient said that he thinks the [place] Bud is helping some. He has been on it for about a week. Still having the constipation and then diarrhea but feels a little better. How long should the patient continue on the [place]? We have him more samples. Currently he cant get it from the store. They are out of it

## 2021-02-27 NOTE — Telephone Encounter (Signed)
Patient will pick more up Monday or tuesday

## 2021-03-10 ENCOUNTER — Other Ambulatory Visit (HOSPITAL_COMMUNITY): Payer: Medicare Other

## 2021-03-10 ENCOUNTER — Telehealth: Payer: Self-pay

## 2021-03-10 NOTE — Telephone Encounter (Signed)
Spoke with patient and he stated that after 6 days of being on Restora his blood pressure started to go up. BP went up to 198/89 so he stopped the Restora last week and his blood pressure started to go down and he had to go to a whole pill but it has been dropping. Have seen readings of 160/72 and 132/60 so he knows it is going down. And he said that he did drop weight while being on Restora. Dropped a good 6 pounds and weighs 184lbs. Main concern is that this product does have fish oil and patient has a had a history of an allergic reaction to fish oil. Patient wants to know what he can do about this? Told him I would reach out to the nurse regarding this. He said that he was willing to try every other day but given his hx with fish oil I told him we could see or maybe he needs a different probiotic in general. Told him to call in 2-3 days if he hasn't heard anything from the nurse. Please advise

## 2021-03-10 NOTE — Telephone Encounter (Signed)
Dr. Lyndel Safe Pt Please see note below. Along with the note below pt states that he has been having some diarrhea and taking some imodium to help with the loose stools.  Please advise as DOD

## 2021-03-11 NOTE — Telephone Encounter (Signed)
Dr. Lyndel Safe Pt  Please See NOTES below and advise as DOD

## 2021-03-11 NOTE — Telephone Encounter (Signed)
Pt given Dr. Bryan Lemma recommendations.  Pt verbalized understanding with all questions answered.

## 2021-03-11 NOTE — Progress Notes (Signed)
HPI  M former Smoker (54 pk yrs) followed for  for asthma/ bronchitis complicated by Tobacco use, CAD/ CABG,  PAD/ Claudication, GERD, PE 05/24/2018, Nocturnal Hypoxemia, PAFib, GERD, Covid infection 9/ 2021,  PFT 09/08/12   ? 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, Covid infection 9//2021 , Home O2 2L/ [facility]-    Started by Dr Einar Gip -Albuterol HFA, Judithann Sauger,  Body weight today-193 lbs Covid vax-no Flu vax-had -----Patient feels good overall, no concerns at this time.  Ran out of Breztri samples. Didn't call us, but seemed to help. Wary of cost.  CXR 03/11/20- IMPRESSION: Mild bronchitic changes with streaky atelectasis left base.  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 Doesn't remember any problem with levaquin.  Never quite cleared, then relapsed again after each round of antibiotics. We discussed possibility of aspiration. Chews tobacco. Discussed imaging.  CTaPE 12/16/20- IMPRESSION: 1. Negative  for pulmonary embolism. 2. Bronchitic markings with right lower lobe atelectasis or bronchopneumonia. CTabd10/13/22- 1. Increased consolidation and bronchiectasis in the medial right lower lobe and new patchy peripheral densities in left lower lobe. Findings are concerning for areas of pneumonia or aspiration. 2. No acute inflammatory process in the abdomen or pelvis. 3. Postoperative changes from right inguinal hernia repair.  ROS-see HPI + = positive Constitutional:   No-   weight loss, night sweats, fevers, chills, fatigue, lassitude. HEENT:   No-  headaches, difficulty swallowing, tooth/dental problems, sore throat,       No-  sneezing, itching, ear ache, nasal congestion, post nasal drip,  CV:  No-   chest pain, orthopnea, PND, swelling in lower extremities, anasarca, dizziness, palpitations Resp: +-shortness of breath with exertion or at rest.              +   productive cough,  + non-productive cough,  No- coughing up of blood.              No-   change in color of mucus.  No- wheezing.   Skin: No-   rash or lesions. GI:  No-   heartburn, indigestion, abdominal pain, nausea, vomiting,  GU:  MS:  No-   joint pain or swelling.  . Neuro-     nothing unusual Psych:  No- change in mood or affect. No depression or anxiety.  No memory loss.  OBJ- Physical Exam General- Alert, Oriented, Affect-appropriate, Distress- none acute Skin- rash-none, lesions- none, excoriation- none Lymphadenopathy- none Head- atraumatic            Eyes- Gross vision intact, PERRLA, conjunctivae and secretions clear  Ears- Hearing, canals-normal            Nose- Clear, no-Septal dev, mucus, polyps, erosion, perforation             Throat- Mallampati II , mucosa clear , drainage- none, tonsils- atrophic, dentures Neck- flexible , trachea midline, no stridor , thyroid nl, carotid no bruit Chest - symmetrical excursion , unlabored           Heart/CV- RRR occasional extra beat , no murmur , no  gallop  , no rub, nl s1 s2                           - JVD- none , edema- none, stasis changes- none, varices- none           Lung- + wheeze+ , +distant esp R base, cough+light , dullness-none, rub- none           Chest wall-  Abd-  Br/ Gen/ Rectal- Not done, not indicated Extrem- cyanosis- none, clubbing, none, atrophy- none, strength- nl Neuro- grossly intact to observation

## 2021-03-12 ENCOUNTER — Ambulatory Visit (INDEPENDENT_AMBULATORY_CARE_PROVIDER_SITE_OTHER): Payer: Medicare Other | Admitting: Internal Medicine

## 2021-03-12 ENCOUNTER — Other Ambulatory Visit: Payer: Self-pay

## 2021-03-12 ENCOUNTER — Encounter: Payer: Self-pay | Admitting: Internal Medicine

## 2021-03-12 VITALS — BP 126/60 | HR 72 | Temp 97.9°F | Ht 69.0 in | Wt 192.2 lb

## 2021-03-12 DIAGNOSIS — J449 Chronic obstructive pulmonary disease, unspecified: Secondary | ICD-10-CM

## 2021-03-12 DIAGNOSIS — I2699 Other pulmonary embolism without acute cor pulmonale: Secondary | ICD-10-CM

## 2021-03-12 DIAGNOSIS — Z23 Encounter for immunization: Secondary | ICD-10-CM | POA: Diagnosis not present

## 2021-03-12 DIAGNOSIS — J189 Pneumonia, unspecified organism: Secondary | ICD-10-CM | POA: Diagnosis not present

## 2021-03-12 DIAGNOSIS — I6523 Occlusion and stenosis of bilateral carotid arteries: Secondary | ICD-10-CM | POA: Diagnosis not present

## 2021-03-12 MED ORDER — LEVOFLOXACIN 750 MG PO TABS: 750.0000 mg | ORAL_TABLET | Freq: Every day | ORAL | 0 refills | Status: DC

## 2021-03-12 NOTE — Patient Instructions (Signed)
Order- flu vax- senior  Script sent for levaquin antibiotic. Take it with food. Hopefully you can tolerate his, but if not, stop and let me know.  Script sent to try Trelegy 100    inhale 1 puff, then rinse mouth, once daily  Please call as needed

## 2021-03-26 ENCOUNTER — Ambulatory Visit: Payer: Medicare Other | Admitting: Cardiology

## 2021-03-26 ENCOUNTER — Encounter: Payer: Self-pay | Admitting: Cardiology

## 2021-03-26 ENCOUNTER — Other Ambulatory Visit: Payer: Self-pay

## 2021-03-26 VITALS — BP 143/60 | HR 41 | Temp 97.9°F | Resp 16 | Ht 69.0 in | Wt 195.0 lb

## 2021-03-26 DIAGNOSIS — E7800 Pure hypercholesterolemia, unspecified: Secondary | ICD-10-CM

## 2021-03-26 DIAGNOSIS — I25118 Atherosclerotic heart disease of native coronary artery with other forms of angina pectoris: Secondary | ICD-10-CM

## 2021-03-26 DIAGNOSIS — I10 Essential (primary) hypertension: Secondary | ICD-10-CM

## 2021-03-26 DIAGNOSIS — I739 Peripheral vascular disease, unspecified: Secondary | ICD-10-CM

## 2021-03-26 MED ORDER — AMLODIPINE BESYLATE 5 MG PO TABS: 5.0000 mg | ORAL_TABLET | Freq: Every day | ORAL | 2 refills | Status: DC

## 2021-03-26 MED ORDER — SPIRONOLACTONE 25 MG PO TABS: 25.0000 mg | ORAL_TABLET | ORAL | 2 refills | Status: DC

## 2021-03-26 NOTE — Progress Notes (Signed)
Primary Physician/Referring:  [name], MD  Patient ID: Brian Brock, male    DOB: 15-Aug-1945, 75 y.o.   MRN: [id number]  Chief Complaint  Patient presents with   Coronary Artery Disease   Follow-up   HPI:    Brian Brock  is a 75 y.o. Caucasian male with CAD with multiple angioplasties over the years, due to restenosis underwent CABG on 09/12/2012 but had recurrence of angina and LIMA occluded needing LAD stenting in 2016.  Past medical history significant for peripheral artery disease with left iliac artery and left SFA stenting in 2014 and drug-coated balloon angioplasty followed by DCB on 09/05/2019 for left common femoral artery.   Past medical history also significant for hypertension, hyperglycemia, hyperlipidemia, hypothyroidism, spontaneous DVT and pulmonary embolism in January 2020 and has been on Xarelto since then.  He has chronic dyspnea on exertion and COPD with quit tobacco use  in Apr 2022, nocturnal hypoxemia on home O2.  He made an appointment to discuss CT angiogram report with me.  I had seen him 3 months ago.   Past Medical History:  Diagnosis Date   AF (atrial fibrillation) (HCC)    happened after CABG, has not had it since   Allergic rhinitis    Allergy    Anginal pain ([place]) 1997   Arthritis    "lower back" (09/26/2015)   Childhood asthma    Chronic bronchitis (HCC)    Yearly   COPD (chronic obstructive pulmonary disease) ([place])    Coronary artery disease    Multiple stents   Dyslipidemia    Dysrhythmia    Femoral hernia of right side 12/28/2016   GERD (gastroesophageal reflux disease)    History of hiatal hernia    Hyperlipemia    Hypertension    Hypothyroidism    Irritable bowel syndrome    Myocardial infarct ([place]) 1997   "mild"   PE (pulmonary thromboembolism) ([place]) 05/2018   Peripheral vascular disease ([place])    prior stenting   Pre-diabetes    Restless legs    Past Surgical History:  Procedure Laterality Date   CARDIAC  CATHETERIZATION  2004   had remote tandem stents in 1997 and 1998 for LAD lesions, RCA stent in 1997 , midprto=ion, and a proximal RCA  SENT IN 1999, all DES; some were IVIS  guide.   CARDIAC CATHETERIZATION  09/07/2012   demonstrated severe 2- vessel disease,patent LAD,patient circ and patent right stents, but 90% ostial LAD whic was new.   CAROTID DOPPLER  100/24/11   RGT BULB and PROXIMAL ICA 0-49%; LFT. ICA  0-49 ; RGT SUBCLAVIAN ARTERY < 50% ; LFT SUBCLAVIAN normal patency; RGT  and LFT  VERTEBRAL ARTERIES   COLONOSCOPY  11/06/2016   Diverticulosis of colon. Internal hemorrhoids.    CORONARY ANGIOPLASTY     CORONARY ANGIOPLASTY WITH STENT PLACEMENT  '97, '98, '04, '06, '08, '10   seven cardiac stents   CORONARY ARTERY BYPASS GRAFT N/A 09/12/2012   Procedure: OFF PUMP CORONARY ARTERY BYPASS GRAFTING (CABG);  Surgeon: [place] Nakayama, MD;  Location: [place];  Service: Open Heart Surgery;  Laterality: N/A;  Times two using left internal mammary artery and endoscopically harvested right saphenous vein  ;LIMA-LAD and SVG -OM1   DOPPLER ECHOCARDIOGRAPHY  11/23/2012,09/30/2010    LV EF 50-55%----09/30/2010-LV EF = >55%   ENDARTERECTOMY FEMORAL Right 12/19/2015   Procedure: ENDARTERECTOMY RIGHT FEMORAL ARTERY;  Surgeon: Serafina Mitchell, MD;  Location: [place];  Service: Vascular;  Laterality: Right;   event monitor  11/04/2012-11/17/2012   pt denies this hx on 07/24/2014   EYE SURGERY Bilateral    cataract   FEMORAL ARTERY STENT Bilateral 7/06  8/08   Lt SFA '06, Rt SFA 8/08   HERNIA REPAIR  10/2008   "repaired w/gallbladder OR"   HERNIA REPAIR  07/2016   INGUINAL HERNIA REPAIR Right 12/28/2016   Procedure: OPEN REPAIR RIGHT GROIN HERNIA WITH MESH;  Surgeon: Fanny Skates, MD;  Location: [place];  Service: General;  Laterality: Right;   INGUINAL HERNIA REPAIR Right 02/21/2020   Procedure: LAPAROSCOPIC RIGHT INGUINAL HERNIA REPAIR WITH MESH;  Surgeon: Ralene Ok, MD;  Location: [facility];   Service: General;  Laterality: Right;   INTRAVASCULAR LITHOTRIPSY  09/05/2019   Procedure: INTRAVASCULAR LITHOTRIPSY;  Surgeon: Adrian Prows, MD;  Location: [place] [facility];  Service: Cardiovascular;;  left common femoral   LAPAROSCOPIC CHOLECYSTECTOMY  June 2010   LEFT HEART CATHETERIZATION WITH CORONARY ANGIOGRAM N/A 09/07/2012   Procedure: LEFT HEART CATHETERIZATION WITH CORONARY ANGIOGRAM;  Surgeon: Troy Sine, MD;  [facility];  Service: Cardiovascular;  Laterality: N/A;   LEFT HEART CATHETERIZATION WITH CORONARY/GRAFT ANGIOGRAM N/A 07/24/2014   Procedure: LEFT HEART CATHETERIZATION WITH Beatrix Fetters;  Surgeon: Troy Sine, MD; LAD 70%, 50% ISR, mLAD 99%, CFX 30%, patent stent, RCA patent, LIMA-LAD occluded, SVG-OM 1 patent, EF 50-55%   LEXISCAN STRESS TEST  09/06/2012   LOWER ARTERIAL DOPPLER  03/25/2012   FGT CIA 0-49%; RGT CFA AND SFA AT STENT 0-49%; RGT DISTAL SFA = <50%; LFT. EIA  50-69%; LFT. CFA 0-49% ;;LFT. SFA  AT STENT NORMAL PATENCY   LOWER EXTREMITY ANGIOGRAPHY N/A 09/05/2019   Procedure: LOWER EXTREMITY ANGIOGRAPHY;  Surgeon: Adrian Prows, MD;  Location: [place] [facility];  Service: Cardiovascular;  Laterality: N/A;   NM MYOVIEW LTD  05/06/2012   EF -STUDY NOT GATED, NORMAL STRESS NUCLEAR ,NORMAL LV FUNCTION   PATCH ANGIOPLASTY Right 12/19/2015   Procedure: PATCH ANGIOPLASTY RIGHT FEMORAL ARTERY;  Surgeon: Serafina Mitchell, MD;  Location: [place];  Service: Vascular;  Laterality: Right;   PERCUTANEOUS CORONARY STENT INTERVENTION (PCI-S) Right 07/24/2014   Procedure: PERCUTANEOUS CORONARY STENT INTERVENTION (PCI-S);  Surgeon: Troy Sine, MD; ostial LAD PTCA with angioscope scoring balloon, 3.0 x 34 mm Resolute DES extending from the ostium to the mid LAD, covering all significant stenoses    PERIPHERAL VASCULAR CATHETERIZATION N/A 09/26/2015   Procedure: Abdominal Aortogram w/Lower Extremity;  Surgeon: Lorretta Harp, MD;  Location: [place] [facility];   Service: Cardiovascular;  Laterality: N/A;   PERIPHERAL VASCULAR CATHETERIZATION Right 02/20/2016   Procedure: Peripheral Vascular Atherectomy;  Surgeon: Lorretta Harp, MD;  Location: [place] [facility];  Service: Cardiovascular;  Laterality: Right;  SFA   PERIPHERAL VASCULAR CATHETERIZATION Right 02/20/2016   Procedure: Peripheral Vascular Balloon Angioplasty;  Surgeon: Lorretta Harp, MD;  Location: [place] [facility];  Service: Cardiovascular;  Laterality: Right;  SFA   PERIPHERAL VASCULAR INTERVENTION  09/05/2019   Procedure: PERIPHERAL VASCULAR INTERVENTION;  Surgeon: Adrian Prows, MD;  Location: [place] [facility];  Service: Cardiovascular;;  left common iliac   TONSILLECTOMY  1950's   UPPER GASTROINTESTINAL ENDOSCOPY     Family History  Problem Relation Age of Onset   Coronary artery disease Father 73   Heart attack Father    Coronary artery disease Brother 22       CABG in 2000   Deep vein thrombosis  Brother    Colon cancer Neg Hx    Esophageal cancer Neg Hx    Rectal cancer Neg Hx    Stomach cancer Neg Hx     Social History   Tobacco Use   Smoking status: Former    Packs/day: 0.50    Years: 56.00    Pack years: 28.00    Types: Cigarettes    Quit date: 08/23/2020    Years since quitting: 0.5   Smokeless tobacco: Current    Types: Chew  Substance Use Topics   Alcohol use: Yes    Alcohol/week: 1.0 standard drink    Types: 1 Standard drinks or equivalent per week    Comment: 09/26/2015 "varies; might have a few drinks q 2 months"   Marital status: Married   ROS   Review of Systems  Cardiovascular:  Positive for dyspnea on exertion. Negative for chest pain, claudication, leg swelling and syncope.  Musculoskeletal:  Positive for arthritis, back pain and joint pain (left toes pain and swelling).  Gastrointestinal:  Negative for melena.  Objective  Blood pressure (!) 143/60, pulse (!) 41, temperature 97.9 F (36.6 C), resp. rate 16, height 5\' 9"  (1.753 m), weight 195  lb (88.5 kg), SpO2 96 %.  Vitals with BMI 03/26/2021 03/12/2021 02/19/2021  Height 5\' 9"  5\' 9"  5\' 9"   Weight 195 lbs 192 lbs 3 oz 194 lbs 8 oz  BMI 28.78 14.48 18.56  Systolic 314 970 263  Diastolic 60 60 78  Pulse 41 72 49     Physical Exam Constitutional:      Appearance: He is well-developed.  Neck:     Vascular: Carotid bruit (bilateral) present. No JVD.  Cardiovascular:     Rate and Rhythm: Regular rhythm. Bradycardia present.     Pulses:          Carotid pulses are 2+ on the right side with bruit and 2+ on the left side.      Femoral pulses are 2+ on the right side with bruit and 2+ on the left side with bruit.      Popliteal pulses are 1+ on the right side and 2+ on the left side.       Dorsalis pedis pulses are 1+ on the right side and 1+ on the left side.       Posterior tibial pulses are 0 on the right side and 2+ on the left side.     Heart sounds: Normal heart sounds.  Pulmonary:     Breath sounds: Rales (bialteral left base worse than right. Diffuse scattered) present.  Abdominal:     General: Bowel sounds are normal.     Palpations: Abdomen is soft.  Musculoskeletal:        General: Tenderness (right big toe) present. No swelling.   Laboratory examination:   Recent Labs    06/17/20 1432 08/26/20 1347 12/16/20 0909 02/24/21 0835  NA 140 142 142 142  K 4.3 4.3 4.2 3.8  CL 106 104 106 105  CO2 27 22 29 29   GLUCOSE 107* 90 147* 85  BUN 22 24 15 13   CREATININE 1.20 1.15 1.24 1.09  CALCIUM 9.9 9.1 9.5 9.1  GFRNONAA >60  --  >60  --    CrCl cannot be calculated (Patient's most recent lab result is older than the maximum 21 days allowed.).  CMP Latest Ref Rng & Units 02/24/2021 12/16/2020 08/26/2020  Glucose 70 - 99 mg/dL 85 147(H) 90  BUN 6 - 23 mg/dL  13 15 24   Creatinine 0.40 - 1.50 mg/dL 1.09 1.24 1.15  Sodium 135 - 145 mEq/L 142 142 142  Potassium 3.5 - 5.1 mEq/L 3.8 4.2 4.3  Chloride 96 - 112 mEq/L 105 106 104  CO2 19 - 32 mEq/L 29 29 22   Calcium 8.4 -  10.5 mg/dL 9.1 9.5 9.1  Total Protein 6.0 - 8.3 g/dL 6.4 6.7 -  Total Bilirubin 0.2 - 1.2 mg/dL 0.9 0.5 -  Alkaline Phos 39 - 117 U/L 78 74 -  AST 0 - 37 U/L 20 19 -  ALT 0 - 53 U/L 15 14 -   CBC Latest Ref Rng & Units 02/24/2021 12/16/2020 06/17/2020  WBC 4.0 - 10.5 K/uL 8.7 9.3 9.3  Hemoglobin 13.0 - 17.0 g/dL 13.5 13.5 15.3  Hematocrit 39.0 - 52.0 % 41.3 41.5 46.6  Platelets 150.0 - 400.0 K/uL 175.0 269 197   Last lipids Lab Results  Component Value Date   CHOL 123 01/02/2021   HDL 36 (L) 01/02/2021   LDLCALC 67 01/02/2021   TRIG 106 01/02/2021   CHOLHDL 2.9 02/07/2018   HEMOGLOBIN A1C Lab Results  Component Value Date   HGBA1C 6.3 (H) 02/14/2020   MPG 134.11 02/14/2020   TSH No results for input(s): TSH in the last 8760 hours.  Allergies   Allergies  Allergen Reactions   Other Anaphylaxis, Shortness Of Breath and Swelling    [place] nuts- "will kill me"   Clarithromycin Nausea Only   Fenofibrate Micronized Other (See Comments)    Caused weakness/lethargy   Metoprolol Other (See Comments)    Fatigue and malaise   Tricor [Fenofibrate] Other (See Comments)    Tired/weak   Fish Oil Rash   Levofloxacin Other (See Comments)    Doesn't remember   Meloxicam Other (See Comments)    Achy, fatigue   Metaxalone Other (See Comments)    Doesn't remember   Niaspan [Niacin] Other (See Comments)    Doesn't remember   Sulfadiazine Hives and Rash    Medications Prior to Visit:   Outpatient Medications Prior to Visit  Medication Sig Dispense Refill   acetaminophen (TYLENOL) 500 MG tablet Take 500-1,000 mg by mouth every 6 (six) hours as needed for moderate pain.      albuterol (PROVENTIL) (2.5 MG/3ML) 0.083% nebulizer solution Use 44mL by nebulization every 6 hours and as needed and for wheezing or shortness of breath. 360 mL 6   Ascorbic Acid (VITAMIN C) 1000 MG tablet Take 1,000 mg by mouth in the morning and at bedtime.      cetirizine (ZYRTEC) 10 MG tablet Take 10 mg by  mouth daily.     Cholecalciferol (VITAMIN D3) 50 MCG (2000 UT) TABS Take 2,000 Units by mouth daily.     cyclobenzaprine (FLEXERIL) 10 MG tablet Take 10 mg by mouth 3 (three) times daily as needed for muscle spasms.     levofloxacin (LEVAQUIN) 750 MG tablet Take 1 tablet (750 mg total) by mouth daily. 7 tablet 0   levothyroxine (SYNTHROID, LEVOTHROID) 25 MCG tablet Take 25 mcg by mouth daily before breakfast.      loperamide (IMODIUM A-D) 2 MG tablet Take 2 mg by mouth 4 (four) times daily as needed for diarrhea or loose stools.     losartan (COZAAR) 100 MG tablet Take 100 mg by mouth daily.     Melatonin 12 MG TABS Take 12 mg by mouth at bedtime.      nitroGLYCERIN (NITROSTAT) 0.4 MG SL tablet PLACE  1 TABLET UNDER THE TONGUE, MAY REPEAT EVERY 5 MINUTES MAX 3 TABLETS 25 tablet 11   OXYGEN Inhale 2 L into the lungs at bedtime.     pantoprazole (PROTONIX) 40 MG tablet Take 1 tablet (40 mg total) by mouth 2 (two) times daily. (Patient taking differently: Take 40 mg by mouth at bedtime.) 60 tablet 0   Probiotic Product (PROBIOTIC PO) Take 1 capsule by mouth daily.     rivaroxaban (XARELTO) 20 MG TABS tablet Take 1 tablet (20 mg total) by mouth daily with supper. 90 tablet 3   rosuvastatin (CRESTOR) 20 MG tablet TAKE ONE TABLET BY MOUTH EVERY DAY (Patient taking differently: Take 10 mg by mouth daily.) 90 tablet 3   zolpidem (AMBIEN) 10 MG tablet Take 10 mg by mouth at bedtime as needed for sleep.      BYSTOLIC 5 MG tablet TAKE 1/2 TABLET ONCE DAILY (Patient taking differently: Take 2.5 mg by mouth daily.) 30 tablet 6   No facility-administered medications prior to visit.   Final Medications at End of Visit    Current Meds  Medication Sig   acetaminophen (TYLENOL) 500 MG tablet Take 500-1,000 mg by mouth every 6 (six) hours as needed for moderate pain.    albuterol (PROVENTIL) (2.5 MG/3ML) 0.083% nebulizer solution Use 33mL by nebulization every 6 hours and as needed and for wheezing or shortness  of breath.   amLODipine (NORVASC) 5 MG tablet Take 1 tablet (5 mg total) by mouth daily.   Ascorbic Acid (VITAMIN C) 1000 MG tablet Take 1,000 mg by mouth in the morning and at bedtime.    cetirizine (ZYRTEC) 10 MG tablet Take 10 mg by mouth daily.   Cholecalciferol (VITAMIN D3) 50 MCG (2000 UT) TABS Take 2,000 Units by mouth daily.   cyclobenzaprine (FLEXERIL) 10 MG tablet Take 10 mg by mouth 3 (three) times daily as needed for muscle spasms.   levofloxacin (LEVAQUIN) 750 MG tablet Take 1 tablet (750 mg total) by mouth daily.   levothyroxine (SYNTHROID, LEVOTHROID) 25 MCG tablet Take 25 mcg by mouth daily before breakfast.    loperamide (IMODIUM A-D) 2 MG tablet Take 2 mg by mouth 4 (four) times daily as needed for diarrhea or loose stools.   losartan (COZAAR) 100 MG tablet Take 100 mg by mouth daily.   Melatonin 12 MG TABS Take 12 mg by mouth at bedtime.    nitroGLYCERIN (NITROSTAT) 0.4 MG SL tablet PLACE 1 TABLET UNDER THE TONGUE, MAY REPEAT EVERY 5 MINUTES MAX 3 TABLETS   OXYGEN Inhale 2 L into the lungs at bedtime.   pantoprazole (PROTONIX) 40 MG tablet Take 1 tablet (40 mg total) by mouth 2 (two) times daily. (Patient taking differently: Take 40 mg by mouth at bedtime.)   Probiotic Product (PROBIOTIC PO) Take 1 capsule by mouth daily.   rivaroxaban (XARELTO) 20 MG TABS tablet Take 1 tablet (20 mg total) by mouth daily with supper.   rosuvastatin (CRESTOR) 20 MG tablet TAKE ONE TABLET BY MOUTH EVERY DAY (Patient taking differently: Take 10 mg by mouth daily.)   spironolactone (ALDACTONE) 25 MG tablet Take 1 tablet (25 mg total) by mouth every morning.   zolpidem (AMBIEN) 10 MG tablet Take 10 mg by mouth at bedtime as needed for sleep.    [DISCONTINUED] BYSTOLIC 5 MG tablet TAKE 1/2 TABLET ONCE DAILY (Patient taking differently: Take 2.5 mg by mouth daily.)    Radiology:   CT angio chest 12/16/2020: Coronary atherosclerosis SP CABG. Mediastinum normal.  Lungs show airway thickening  bilaterally with intermittent narrowing.  Mucoid impaction in the right lower lobe where there is streaky opacity and volume loss.  Otherwise no significant abnormality.  CT angiogram of the abdomen 02/27/2021: 1. Atherosclerosis throughout the abdomen and pelvis. Aortic Atherosclerosis (ICD10-I70.0). 2. Left iliac artery stents are patent. Occlusive disease involving the origin and proximal aspect of the internal iliac arteries bilaterally. 3. Mixed plaque involving the origin of the celiac trunk with approximately 50% narrowing. SMA and IMA are patent.   NON-VASCULAR   1. Increased consolidation and bronchiectasis in the medial right lower lobe and new patchy peripheral densities in left lower lobe. Findings are concerning for areas of pneumonia or aspiration.  Cardiac Studies:   Coronary angiogram and stenting to his LAD and RCA  in 1997, 1998, and in 2004 to his proximal left circumflex coronary artery.  Due to recurrent restenosis underwent CABG on 09/12/2012 with off-pump LIMA to LAD and SVG to OM1.  CABG  by Dr. Roxan Hockey on 09/12/2012 with an off-pump LIMA to LAD (Occluded by angoigram in 2016) and SVG to circumflex marginal 1 vessel.  Coronary angiogram 07/24/2014: High-grade stenosis to the proximal LAD, patent circumflex stent, patent SVG to OM1 however LIMA to LAD occluded.  Successful stenting to proximal LAD with 3.0 x 34 mm resolute DES, postdilated to 3.4 mm.  Peripheral arteriogram: Stenting to his left SFA in 2004 and right SFA in 2008.  Stenting of left external iliac artery, right common femoral artery endarterectomy and patch angioplasty in August 2017 with staged right SFA atherectomy.  Lower extremity arterial duplex 11/02/2018: Right: Atherosclerosis throughout the right lower extremity without focal stenosis.  Patent mid-distal SFA stent with 1-49% stenosis.  Left: Atherosclerosis throughout the left lower extremity.  Patent distal SFA stent with 1-49%  stenosis.   Carotid artery duplex  08/07/2019:  Stenosis in the right internal carotid artery (16-49%). Stenosis in the  right external carotid artery (<50%).  Stenosis in the left internal carotid artery (16-49%). Stenosis in the  left common carotid artery (<50%).  Antegrade right vertebral artery flow. Antegrade left vertebral artery  flow.  Follow up in one year is appropriate if clinically indicated.   Echocardiogram 08/07/2019:  Normal LV systolic function with visual EF 50-55%. Left ventricle cavity  is normal in size. Normal global wall motion. No obvious regional wall  motion abnormalities. Mild concentric hypertrophy of the left ventricle.  Indeterminate diastolic filling pattern, normal LAP. Calculated EF 50%.  Left atrial cavity is severely dilated.  Visually, right atrial cavity is mildly dilated.  Mild mitral regurgitation.  Moderate tricuspid regurgitation. No evidence of pulmonary hypertension. RVSP measures 33 mmHg.  Mild pulmonic regurgitation.  IVC is dilated with a respiratory response of >50%.  No significant change compared to previous study 05/25/2018.   Lexiscan (Walking with mod Bruce)Tetrofosmin Stress Test  08/07/2019: Nondiagnostic ECG stress. Perfusion imaging study is abnormal.  Mild inferior, inferolateral small sized defect consistent with ischemia. Left ventricular systolic function is mildly depressed, LVEF calculated at 49% with inferior hypokinesis. Compared to 10/22/2016 report- normal study.  Intermediate risk study.   Peripheral arteriogram 09/05/19: Abdominal aortogram revealed presence of 2 renal arteries 1 on either sides, widely patent.  Mild atherosclerotic changes abdominal aorta noted.  Bilateral aortoiliac bifurcation was widely patent with mild calcification.  There was diffuse moderate calcification noted throughout the entire iliac artery system.  Bilateral internal iliac arteries are occluded.  There is a old left external iliac  artery  stent which has a 40 to 50% in-stent restenosis focally without pressure gradient.  Left common iliac artery has a 80% stenosis with a 20 to 30 mm pressure gradient. Left common femoral artery is very long and profunda bifurcation happens distal than normal, the left common femoral artery has a calcific 80 to 90% stenosis, brisk flow in the left SFA and old left SFA stent is widely patent.  Below the left knee there is two-vessel runoff with occluded left PT.   Right common femoral artery patch angioplasty site is widely patent.  Right SFA shows mild diffuse disease with moderate amount of calcification.  Previously placed right mid SFA stent is widely patent.  There is three-vessel runoff below the right knee.  Intervention data: 1. Left common femoral artery: PTA with Shockwave lithotripsy with a 7.0 x 60 mm shockwave balloon followed by drug-coated balloon angioplasty with a 8 mm x 40 mm Lutonix angioplasty, stenosis reduced from 85% to less than 10% with brisk flow. 2. PTA and self-expanding 10.0 x 40 mm absolute Pro stent to the left common iliac artery followed by post angioplasty with a 9.0 x 40 mm Mustang at 8 atmospheric pressure for 60 seconds, stenosis reduced from 80% to 0%.  There was also left external iliac artery old stent that had a 50% stenosis was also stented with the same self-expanding stent overlapped followed by balloon angioplasty.  Overall no residual stenosis.  ABI 09/26/2019:  This exam reveals mildly decreased perfusion of the right lower extremity, noted at the anterior tibial artery level (ABI 0.83) and mildly decreased  perfusion of the left lower extremity, noted at the anterior tibial artery level (ABI 0.86). Compared to 08/31/2019, bilateral ABI 0.81. (H/O Left CIA stent and left CFA angioplasty).  Right lower extremity venous duplex 12/16/2020: Partially-occluding filling defect within the right tibioperoneal trunk, posterior tibial and peroneal veins, with  evidence of partial recanalization (Chronic DVT). Findings likely consistent with chronic right lower extremity DVT.  EKG   EKG 01/01/2021: Marked sinus bradycardia at rate of 44 bpm,Leftward axis. No evidence of ischemia.  EKG 07/05/2020: Sinus bradycardia at the rate of 55 bpm, left atrial enlargement, normal axis.  Early repolarization.  No significant change from 07/26/2019    Assessment     ICD-10-CM   1. Coronary artery disease of native artery of native heart with stable angina pectoris ([place])  I25.118     2. Peripheral artery disease  I73.9     3. Primary hypertension  I10 amLODipine (NORVASC) 5 MG tablet    spironolactone (ALDACTONE) 25 MG tablet    Basic metabolic panel    Basic metabolic panel    4. Hypercholesteremia  E78.00       Meds ordered this encounter  Medications   amLODipine (NORVASC) 5 MG tablet    Sig: Take 1 tablet (5 mg total) by mouth daily.    Dispense:  30 tablet    Refill:  2   spironolactone (ALDACTONE) 25 MG tablet    Sig: Take 1 tablet (25 mg total) by mouth every morning.    Dispense:  30 tablet    Refill:  2     Medications Discontinued During This Encounter  Medication Reason   BYSTOLIC 5 MG tablet Discontinued by provider     Orders Placed This Encounter  Procedures   Basic metabolic panel    Standing Status:   Future    Number of Occurrences:   1    Standing Expiration Date:  03/26/2022     Recommendations:   Brian Brock  is a 75 y.o. Caucasian male with CAD with multiple angioplasties over the years, due to restenosis underwent CABG on 09/12/2012 but had recurrence of angina and LIMA occluded needing LAD stenting in 2016.  Past medical history significant for peripheral artery disease with left iliac artery and left SFA stenting in 2014 and drug-coated balloon angioplasty followed by DCB on 09/05/2019 for left common femoral artery.   Past medical history also significant for hypertension, hyperglycemia, hyperlipidemia,  hypothyroidism, spontaneous DVT and pulmonary embolism in January 2020 and has been on Xarelto since then.  He has chronic dyspnea on exertion and COPD with quit tobacco use  in Apr 2022, nocturnal hypoxemia on home O2.  On home oxygen at night has improved his energy level, continues to have fatigue in general.  Dyspnea is remained stable, he has quit smoking since April 2022 and has remained abstinent.  He made an appointment to discuss CT angiogram report with me.  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  His LDL is at goal with regard to hyperlipidemia.  Blood pressure is still elevated.  He has got marked sinus bradycardia, suspect if I were to improve his heart rate, systolic blood pressure may come down, will discontinue Bystolic 2.5 mg he is on.  I will try amlodipine 5 mg daily and spironolactone 25 mg in the morning and obtain a BMP in 2 to 3 weeks.  I would like to see him back in 6 weeks for follow-up.  He has not had any further episodes of claudication.  He is tolerating all his medications well.    Adrian Prows, MD, [facility] 03/26/2021, 12:43 PM Office: [phone] Fax: [phone] Pager: [phone]

## 2021-04-17 LAB — BASIC METABOLIC PANEL
BUN/Creatinine Ratio: 14 (ref 10–24)
BUN: 17 mg/dL (ref 8–27)
CO2: 24 mmol/L (ref 20–29)
Calcium: 8.8 mg/dL (ref 8.6–10.2)
Chloride: 105 mmol/L (ref 96–106)
Creatinine, Ser: 1.24 mg/dL (ref 0.76–1.27)
Glucose: 102 mg/dL — ABNORMAL HIGH (ref 70–99)
Potassium: 4.2 mmol/L (ref 3.5–5.2)
Sodium: 140 mmol/L (ref 134–144)
eGFR: 61 mL/min/{1.73_m2} (ref >59)

## 2021-05-07 ENCOUNTER — Ambulatory Visit: Payer: Medicare Other | Admitting: Cardiology

## 2021-05-08 ENCOUNTER — Ambulatory Visit: Payer: Medicare Other | Admitting: Cardiology

## 2021-05-08 ENCOUNTER — Other Ambulatory Visit: Payer: Self-pay | Admitting: Internal Medicine

## 2021-05-27 ENCOUNTER — Other Ambulatory Visit: Payer: Self-pay | Admitting: Internal Medicine

## 2021-05-28 LAB — SARS CORONAVIRUS 2 (TAT 6-24 HRS): SARS Coronavirus 2: NEGATIVE

## 2021-05-29 ENCOUNTER — Other Ambulatory Visit: Payer: Self-pay

## 2021-05-29 ENCOUNTER — Encounter: Payer: Self-pay | Admitting: Cardiology

## 2021-05-29 ENCOUNTER — Ambulatory Visit: Payer: Medicare Other | Admitting: Cardiology

## 2021-05-29 VITALS — BP 165/82 | HR 60 | Ht 69.0 in | Wt 196.0 lb

## 2021-05-29 DIAGNOSIS — E7800 Pure hypercholesterolemia, unspecified: Secondary | ICD-10-CM

## 2021-05-29 DIAGNOSIS — I25118 Atherosclerotic heart disease of native coronary artery with other forms of angina pectoris: Secondary | ICD-10-CM

## 2021-05-29 DIAGNOSIS — I10 Essential (primary) hypertension: Secondary | ICD-10-CM

## 2021-05-29 MED ORDER — EDARBYCLOR 40-25 MG PO TABS: 0.5000 | ORAL_TABLET | ORAL | 2 refills | Status: DC

## 2021-05-29 NOTE — Patient Instructions (Signed)
Start the new medication on 06/09/2021

## 2021-05-29 NOTE — Progress Notes (Signed)
Primary Physician/Referring:  [name], MD  Patient ID: Brian Brock, male    DOB: 20-Jan-1946, 76 y.o.   MRN: [id number]  Chief Complaint  Patient presents with   Hypertension   Follow-up   Results   Diarrhea   joint pain   HPI:    Brian Brock  is a 76 y.o. Caucasian male with CAD with multiple angioplasties over the years, due to restenosis underwent CABG on 09/12/2012 but had recurrence of angina and LIMA occluded needing LAD stenting in 2016.  Past medical history significant for peripheral artery disease with left iliac artery and left SFA stenting in 2014 and drug-coated balloon angioplasty followed by DCB on 09/05/2019 for left common femoral artery.   Past medical history also significant for hypertension, hyperglycemia, hyperlipidemia, hypothyroidism, spontaneous DVT and pulmonary embolism in January 2020 and has been on Xarelto since then.  He has chronic dyspnea on exertion and COPD with quit tobacco use  in Apr 2022, nocturnal hypoxemia on home O2.  He presents for follow-up of hypertension, since I added amlodipine along with Aldactazide, patient has noticed marked change in his bowel movements especially diarrhea and also has developed breast tenderness.    Past Medical History:  Diagnosis Date   AF (atrial fibrillation) (HCC)    happened after CABG, has not had it since   Allergic rhinitis    Allergy    Anginal pain ([place]) 1997   Arthritis    "lower back" (09/26/2015)   Childhood asthma    Chronic bronchitis (HCC)    Yearly   COPD (chronic obstructive pulmonary disease) ([place])    Coronary artery disease    Multiple stents   Dyslipidemia    Dysrhythmia    Femoral hernia of right side 12/28/2016   GERD (gastroesophageal reflux disease)    History of hiatal hernia    Hyperlipemia    Hypertension    Hypothyroidism    Irritable bowel syndrome    Myocardial infarct ([place]) 1997   "mild"   PE (pulmonary thromboembolism) ([place]) 05/2018   Peripheral  vascular disease ([place])    prior stenting   Pre-diabetes    Restless legs    Past Surgical History:  Procedure Laterality Date   CARDIAC CATHETERIZATION  2004   had remote tandem stents in 1997 and 1998 for LAD lesions, RCA stent in 1997 , midprto=ion, and a proximal RCA  SENT IN 1999, all DES; some were IVIS  guide.   CARDIAC CATHETERIZATION  09/07/2012   demonstrated severe 2- vessel disease,patent LAD,patient circ and patent right stents, but 90% ostial LAD whic was new.   CAROTID DOPPLER  100/24/11   RGT BULB and PROXIMAL ICA 0-49%; LFT. ICA  0-49 ; RGT SUBCLAVIAN ARTERY < 50% ; LFT SUBCLAVIAN normal patency; RGT  and LFT  VERTEBRAL ARTERIES   COLONOSCOPY  11/06/2016   Diverticulosis of colon. Internal hemorrhoids.    CORONARY ANGIOPLASTY     CORONARY ANGIOPLASTY WITH STENT PLACEMENT  '97, '98, '04, '06, '08, '10   seven cardiac stents   CORONARY ARTERY BYPASS GRAFT N/A 09/12/2012   Procedure: OFF PUMP CORONARY ARTERY BYPASS GRAFTING (CABG);  Surgeon: Melrose Nakayama, MD;  Location: [facility];  Service: Open Heart Surgery;  Laterality: N/A;  Times two using left internal mammary artery and endoscopically harvested right saphenous vein  ;LIMA-LAD and SVG -OM1   DOPPLER ECHOCARDIOGRAPHY  11/23/2012,09/30/2010    LV EF 50-55%----09/30/2010-LV EF = >55%   ENDARTERECTOMY FEMORAL Right 12/19/2015  Procedure: ENDARTERECTOMY RIGHT FEMORAL ARTERY;  Surgeon: Serafina Mitchell, MD;  Location[facility] OR;  Service: Vascular;  Laterality: Right;   event monitor  11/04/2012-11/17/2012   pt denies this hx on 07/24/2014   EYE SURGERY Bilateral    cataract   FEMORAL ARTERY STENT Bilateral 7/06  8/08   Lt SFA '06, Rt SFA 8/08   HERNIA REPAIR  10/2008   "repaired w/gallbladder OR"   HERNIA REPAIR  07/2016   INGUINAL HERNIA REPAIR Right 12/28/2016   Procedure: OPEN REPAIR RIGHT GROIN HERNIA WITH MESH;  Surgeon: Fanny Skates, MD;  Location: [facility];  Service: General;  Laterality: Right;   INGUINAL HERNIA  REPAIR Right 02/21/2020   Procedure: LAPAROSCOPIC RIGHT INGUINAL HERNIA REPAIR WITH MESH;  Surgeon: Ralene Ok, MD;  Location: [place];  Service: General;  Laterality: Right;   INTRAVASCULAR LITHOTRIPSY  09/05/2019   Procedure: INTRAVASCULAR LITHOTRIPSY;  Surgeon: Adrian Prows, MD;  Location: [facility];  Service: Cardiovascular;;  left common femoral   LAPAROSCOPIC CHOLECYSTECTOMY  June 2010   LEFT HEART CATHETERIZATION WITH CORONARY ANGIOGRAM N/A 09/07/2012   Procedure: LEFT HEART CATHETERIZATION WITH CORONARY ANGIOGRAM;  Surgeon: Troy Sine, MD;  Location: [facility] CATH LAB;  Service: Cardiovascular;  Laterality: N/A;   LEFT HEART CATHETERIZATION WITH CORONARY/GRAFT ANGIOGRAM N/A 07/24/2014   Procedure: LEFT HEART CATHETERIZATION WITH Beatrix Fetters;  Surgeon: Troy Sine, MD; LAD 70%, 50% ISR, mLAD 99%, CFX 30%, patent stent, RCA patent, LIMA-LAD occluded, SVG-OM 1 patent, EF 50-55%   LEXISCAN STRESS TEST  09/06/2012   LOWER ARTERIAL DOPPLER  03/25/2012   FGT CIA 0-49%; RGT CFA AND SFA AT STENT 0-49%; RGT DISTAL SFA = <50%; LFT. EIA  50-69%; LFT. CFA 0-49% ;;LFT. SFA  AT STENT NORMAL PATENCY   LOWER EXTREMITY ANGIOGRAPHY N/A 09/05/2019   Procedure: LOWER EXTREMITY ANGIOGRAPHY;  Surgeon: Adrian Prows, MD;  Location: [place] [facility];  Service: Cardiovascular;  Laterality: N/A;   NM MYOVIEW LTD  05/06/2012   EF -STUDY NOT GATED, NORMAL STRESS NUCLEAR ,NORMAL LV FUNCTION   PATCH ANGIOPLASTY Right 12/19/2015   Procedure: PATCH ANGIOPLASTY RIGHT FEMORAL ARTERY;  Surgeon: Serafina Mitchell, MD;  Location: [facility];  Service: Vascular;  Laterality: Right;   PERCUTANEOUS CORONARY STENT INTERVENTION (PCI-S) Right 07/24/2014   Procedure: PERCUTANEOUS CORONARY STENT INTERVENTION (PCI-S);  Surgeon: Troy Sine, MD; ostial LAD PTCA with angioscope scoring balloon, 3.0 x 34 mm Resolute DES extending from the ostium to the mid LAD, covering all significant stenoses    PERIPHERAL VASCULAR  CATHETERIZATION N/A 09/26/2015   Procedure: Abdominal Aortogram w/Lower Extremity;  Surgeon: Lorretta Harp, MD;  Location: [place] [facility];  Service: Cardiovascular;  Laterality: N/A;   PERIPHERAL VASCULAR CATHETERIZATION Right 02/20/2016   Procedure: Peripheral Vascular Atherectomy;  Surgeon: Lorretta Harp, MD;  Location: [place] [facility];  Service: Cardiovascular;  Laterality: Right;  SFA   PERIPHERAL VASCULAR CATHETERIZATION Right 02/20/2016   Procedure: Peripheral Vascular Balloon Angioplasty;  Surgeon: Lorretta Harp, MD;  Location: [place] [facility];  Service: Cardiovascular;  Laterality: Right;  SFA   PERIPHERAL VASCULAR INTERVENTION  09/05/2019   Procedure: PERIPHERAL VASCULAR INTERVENTION;  Surgeon: Adrian Prows, MD;  Location: [place] [facility];  Service: Cardiovascular;;  left common iliac   TONSILLECTOMY  1950's   UPPER GASTROINTESTINAL ENDOSCOPY     Family History  Problem Relation Age of Onset   Coronary artery disease Father 43   Heart attack Father  Coronary artery disease Brother 70       CABG in 2000   Deep vein thrombosis Brother    Colon cancer Neg Hx    Esophageal cancer Neg Hx    Rectal cancer Neg Hx    Stomach cancer Neg Hx     Social History   Tobacco Use   Smoking status: Former    Packs/day: 0.50    Years: 56.00    Pack years: 28.00    Types: Cigarettes    Quit date: 08/23/2020    Years since quitting: 0.7   Smokeless tobacco: Current    Types: Chew  Substance Use Topics   Alcohol use: Yes    Alcohol/week: 1.0 standard drink    Types: 1 Standard drinks or equivalent per week    Comment: 09/26/2015 "varies; might have a few drinks q 2 months"   Marital status: Married   ROS   Review of Systems  Cardiovascular:  Positive for dyspnea on exertion and leg swelling. Negative for chest pain and claudication.  Musculoskeletal:  Positive for arthritis, back pain and joint pain (left toes pain and swelling).  Gastrointestinal:  Positive for  diarrhea. Negative for melena.  Objective  Blood pressure (!) 165/82, pulse 60, height 5\' 9"  (1.753 m), weight 196 lb (88.9 kg), SpO2 96 %.  Vitals with BMI 05/29/2021 05/29/2021 03/26/2021  Height - 5\' 9"  5\' 9"   Weight - 196 lbs 195 lbs  BMI - 14.43 15.40  Systolic 086 761 950  Diastolic 82 78 60  Pulse 60 62 41     Physical Exam Constitutional:      Appearance: He is well-developed.  Neck:     Vascular: Carotid bruit (bilateral) present. No JVD.  Cardiovascular:     Rate and Rhythm: Regular rhythm. Bradycardia present.     Pulses:          Carotid pulses are 2+ on the right side with bruit and 2+ on the left side.      Femoral pulses are 2+ on the right side with bruit and 2+ on the left side with bruit.      Popliteal pulses are 1+ on the right side and 2+ on the left side.       Dorsalis pedis pulses are 1+ on the right side and 1+ on the left side.       Posterior tibial pulses are 0 on the right side and 2+ on the left side.     Heart sounds: Normal heart sounds.  Pulmonary:     Breath sounds: Rales (bialteral left base worse than right. Diffuse scattered) present.  Abdominal:     General: Bowel sounds are normal.     Palpations: Abdomen is soft.  Musculoskeletal:     Right lower leg: Edema (2+ ankle) present.     Left lower leg: Edema (2+ ankle) present.   Laboratory examination:   Recent Labs    06/17/20 1432 08/26/20 1347 12/16/20 0909 02/24/21 0835 04/16/21 1415  NA 140   < > 142 142 140  K 4.3   < > 4.2 3.8 4.2  CL 106   < > 106 105 105  CO2 27   < > 29 29 24   GLUCOSE 107*   < > 147* 85 102*  BUN 22   < > 15 13 17   CREATININE 1.20   < > 1.24 1.09 1.24  CALCIUM 9.9   < > 9.5 9.1 8.8  GFRNONAA >60  --  >  60  --   --    < > = values in this interval not displayed.   CrCl cannot be calculated (Patient's most recent lab result is older than the maximum 21 days allowed.).  CMP Latest Ref Rng & Units 04/16/2021 02/24/2021 12/16/2020  Glucose 70 - 99 mg/dL 102(H)  85 147(H)  BUN 8 - 27 mg/dL 17 13 15   Creatinine 0.76 - 1.27 mg/dL 1.24 1.09 1.24  Sodium 134 - 144 mmol/L 140 142 142  Potassium 3.5 - 5.2 mmol/L 4.2 3.8 4.2  Chloride 96 - 106 mmol/L 105 105 106  CO2 20 - 29 mmol/L 24 29 29   Calcium 8.6 - 10.2 mg/dL 8.8 9.1 9.5  Total Protein 6.0 - 8.3 g/dL - 6.4 6.7  Total Bilirubin 0.2 - 1.2 mg/dL - 0.9 0.5  Alkaline Phos 39 - 117 U/L - 78 74  AST 0 - 37 U/L - 20 19  ALT 0 - 53 U/L - 15 14   CBC Latest Ref Rng & Units 02/24/2021 12/16/2020 06/17/2020  WBC 4.0 - 10.5 K/uL 8.7 9.3 9.3  Hemoglobin 13.0 - 17.0 g/dL 13.5 13.5 15.3  Hematocrit 39.0 - 52.0 % 41.3 41.5 46.6  Platelets 150.0 - 400.0 K/uL 175.0 269 197   Last lipids Lab Results  Component Value Date   CHOL 123 01/02/2021   HDL 36 (L) 01/02/2021   LDLCALC 67 01/02/2021   TRIG 106 01/02/2021   CHOLHDL 2.9 02/07/2018   HEMOGLOBIN A1C Lab Results  Component Value Date   HGBA1C 6.3 (H) 02/14/2020   MPG 134.11 02/14/2020   TSH No results for input(s): TSH in the last 8760 hours.  Allergies   Allergies  Allergen Reactions   Other Anaphylaxis, Shortness Of Breath and Swelling    [place] nuts- "will kill me"   Clarithromycin Nausea Only   Fenofibrate Micronized Other (See Comments)    Caused weakness/lethargy   Metoprolol Other (See Comments)    Fatigue and malaise   Tricor [Fenofibrate] Other (See Comments)    Tired/weak   Sulfa Antibiotics     Other reaction(s): Unknown   Fish Oil Rash   Levofloxacin Other (See Comments)    Doesn't remember   Meloxicam Other (See Comments)    Achy, fatigue   Metaxalone Other (See Comments)    Doesn't remember   Niaspan [Niacin] Other (See Comments)    Doesn't remember   Sulfadiazine Hives and Rash    Medications Prior to Visit:   Outpatient Medications Prior to Visit  Medication Sig Dispense Refill   acetaminophen (TYLENOL) 500 MG tablet Take 500-1,000 mg by mouth every 6 (six) hours as needed for moderate pain.      albuterol  (PROVENTIL) (2.5 MG/3ML) 0.083% nebulizer solution Use 28mL by nebulization every 6 hours and as needed and for wheezing or shortness of breath. 360 mL 6   amLODipine (NORVASC) 5 MG tablet Take 1 tablet (5 mg total) by mouth daily. 30 tablet 2   Ascorbic Acid (VITAMIN C) 1000 MG tablet Take 1,000 mg by mouth in the morning and at bedtime.      cetirizine (ZYRTEC) 10 MG tablet Take 10 mg by mouth daily.     Cholecalciferol (VITAMIN D3) 50 MCG (2000 UT) TABS Take 2,000 Units by mouth daily.     cyclobenzaprine (FLEXERIL) 10 MG tablet Take 10 mg by mouth 3 (three) times daily as needed for muscle spasms.     levothyroxine (SYNTHROID, LEVOTHROID) 25 MCG tablet Take 25 mcg by  mouth daily before breakfast.      loperamide (IMODIUM A-D) 2 MG tablet Take 2 mg by mouth 4 (four) times daily as needed for diarrhea or loose stools.     Melatonin 12 MG TABS Take 12 mg by mouth at bedtime.      nitroGLYCERIN (NITROSTAT) 0.4 MG SL tablet PLACE 1 TABLET UNDER THE TONGUE, MAY REPEAT EVERY 5 MINUTES MAX 3 TABLETS 25 tablet 11   OXYGEN Inhale 2 L into the lungs at bedtime.     pantoprazole (PROTONIX) 40 MG tablet Take 1 tablet (40 mg total) by mouth 2 (two) times daily. (Patient taking differently: Take 40 mg by mouth at bedtime.) 60 tablet 0   Probiotic Product (PROBIOTIC PO) Take 1 capsule by mouth daily.     rivaroxaban (XARELTO) 20 MG TABS tablet Take 1 tablet (20 mg total) by mouth daily with supper. 90 tablet 3   rosuvastatin (CRESTOR) 20 MG tablet TAKE ONE TABLET BY MOUTH EVERY DAY (Patient taking differently: Take 10 mg by mouth daily.) 90 tablet 3   zolpidem (AMBIEN) 10 MG tablet Take 10 mg by mouth at bedtime as needed for sleep.      levofloxacin (LEVAQUIN) 750 MG tablet Take 1 tablet (750 mg total) by mouth daily. 7 tablet 0   losartan (COZAAR) 100 MG tablet Take 100 mg by mouth daily.     spironolactone (ALDACTONE) 25 MG tablet Take 1 tablet (25 mg total) by mouth every morning. 30 tablet 2   No  facility-administered medications prior to visit.   Final Medications at End of Visit    Current Meds  Medication Sig   acetaminophen (TYLENOL) 500 MG tablet Take 500-1,000 mg by mouth every 6 (six) hours as needed for moderate pain.    albuterol (PROVENTIL) (2.5 MG/3ML) 0.083% nebulizer solution Use 36mL by nebulization every 6 hours and as needed and for wheezing or shortness of breath.   amLODipine (NORVASC) 5 MG tablet Take 1 tablet (5 mg total) by mouth daily.   Ascorbic Acid (VITAMIN C) 1000 MG tablet Take 1,000 mg by mouth in the morning and at bedtime.    [START ON 06/09/2021] Azilsartan-Chlorthalidone (EDARBYCLOR) 40-25 MG TABS Take 0.5 tablets by mouth every morning.   cetirizine (ZYRTEC) 10 MG tablet Take 10 mg by mouth daily.   Cholecalciferol (VITAMIN D3) 50 MCG (2000 UT) TABS Take 2,000 Units by mouth daily.   cyclobenzaprine (FLEXERIL) 10 MG tablet Take 10 mg by mouth 3 (three) times daily as needed for muscle spasms.   levothyroxine (SYNTHROID, LEVOTHROID) 25 MCG tablet Take 25 mcg by mouth daily before breakfast.    loperamide (IMODIUM A-D) 2 MG tablet Take 2 mg by mouth 4 (four) times daily as needed for diarrhea or loose stools.   Melatonin 12 MG TABS Take 12 mg by mouth at bedtime.    nitroGLYCERIN (NITROSTAT) 0.4 MG SL tablet PLACE 1 TABLET UNDER THE TONGUE, MAY REPEAT EVERY 5 MINUTES MAX 3 TABLETS   OXYGEN Inhale 2 L into the lungs at bedtime.   pantoprazole (PROTONIX) 40 MG tablet Take 1 tablet (40 mg total) by mouth 2 (two) times daily. (Patient taking differently: Take 40 mg by mouth at bedtime.)   Probiotic Product (PROBIOTIC PO) Take 1 capsule by mouth daily.   rivaroxaban (XARELTO) 20 MG TABS tablet Take 1 tablet (20 mg total) by mouth daily with supper.   rosuvastatin (CRESTOR) 20 MG tablet TAKE ONE TABLET BY MOUTH EVERY DAY (Patient taking differently: Take 10  mg by mouth daily.)   zolpidem (AMBIEN) 10 MG tablet Take 10 mg by mouth at bedtime as needed for sleep.     [DISCONTINUED] levofloxacin (LEVAQUIN) 750 MG tablet Take 1 tablet (750 mg total) by mouth daily.   [DISCONTINUED] losartan (COZAAR) 100 MG tablet Take 100 mg by mouth daily.   [DISCONTINUED] spironolactone (ALDACTONE) 25 MG tablet Take 1 tablet (25 mg total) by mouth every morning.    Radiology:   CT angio chest 12/16/2020: Coronary atherosclerosis SP CABG. Mediastinum normal. Lungs show airway thickening bilaterally with intermittent narrowing.  Mucoid impaction in the right lower lobe where there is streaky opacity and volume loss.  Otherwise no significant abnormality.  CT angiogram of the abdomen 02/27/2021: 1. Atherosclerosis throughout the abdomen and pelvis. Aortic Atherosclerosis (ICD10-I70.0). 2. Left iliac artery stents are patent. Occlusive disease involving the origin and proximal aspect of the internal iliac arteries bilaterally. 3. Mixed plaque involving the origin of the celiac trunk with approximately 50% narrowing. SMA and IMA are patent.   NON-VASCULAR   1. Increased consolidation and bronchiectasis in the medial right lower lobe and new patchy peripheral densities in left lower lobe. Findings are concerning for areas of pneumonia or aspiration.  Cardiac Studies:   Coronary angiogram and stenting to his LAD and RCA  in 1997, 1998, and in 2004 to his proximal left circumflex coronary artery.  Due to recurrent restenosis underwent CABG on 09/12/2012 with off-pump LIMA to LAD and SVG to OM1.  CABG  by Dr. Roxan Hockey on 09/12/2012 with an off-pump LIMA to LAD (Occluded by angoigram in 2016) and SVG to circumflex marginal 1 vessel.  Coronary angiogram 07/24/2014: High-grade stenosis to the proximal LAD, patent circumflex stent, patent SVG to OM1 however LIMA to LAD occluded.  Successful stenting to proximal LAD with 3.0 x 34 mm resolute DES, postdilated to 3.4 mm.  Peripheral arteriogram: Stenting to his left SFA in 2004 and right SFA in 2008.  Stenting of left  external iliac artery, right common femoral artery endarterectomy and patch angioplasty in August 2017 with staged right SFA atherectomy.  Lower extremity arterial duplex 11/02/2018: Right: Atherosclerosis throughout the right lower extremity without focal stenosis.  Patent mid-distal SFA stent with 1-49% stenosis.  Left: Atherosclerosis throughout the left lower extremity.  Patent distal SFA stent with 1-49% stenosis.   Carotid artery duplex  08/07/2019:  Stenosis in the right internal carotid artery (16-49%). Stenosis in the  right external carotid artery (<50%).  Stenosis in the left internal carotid artery (16-49%). Stenosis in the  left common carotid artery (<50%).  Antegrade right vertebral artery flow. Antegrade left vertebral artery  flow.  Follow up in one year is appropriate if clinically indicated.   Echocardiogram 08/07/2019:  Normal LV systolic function with visual EF 50-55%. Left ventricle cavity  is normal in size. Normal global wall motion. No obvious regional wall  motion abnormalities. Mild concentric hypertrophy of the left ventricle.  Indeterminate diastolic filling pattern, normal LAP. Calculated EF 50%.  Left atrial cavity is severely dilated.  Visually, right atrial cavity is mildly dilated.  Mild mitral regurgitation.  Moderate tricuspid regurgitation. No evidence of pulmonary hypertension. RVSP measures 33 mmHg.  Mild pulmonic regurgitation.  IVC is dilated with a respiratory response of >50%.  No significant change compared to previous study 05/25/2018.   Lexiscan (Walking with mod Bruce)Tetrofosmin Stress Test  08/07/2019: Nondiagnostic ECG stress. Perfusion imaging study is abnormal.  Mild inferior, inferolateral small sized defect consistent with ischemia. Left ventricular  systolic function is mildly depressed, LVEF calculated at 49% with inferior hypokinesis. Compared to 10/22/2016 report- normal study.  Intermediate risk study.   Peripheral  arteriogram 09/05/19: Abdominal aortogram revealed presence of 2 renal arteries 1 on either sides, widely patent.  Mild atherosclerotic changes abdominal aorta noted.  Bilateral aortoiliac bifurcation was widely patent with mild calcification.  There was diffuse moderate calcification noted throughout the entire iliac artery system.  Bilateral internal iliac arteries are occluded.  There is a old left external iliac artery stent which has a 40 to 50% in-stent restenosis focally without pressure gradient.  Left common iliac artery has a 80% stenosis with a 20 to 30 mm pressure gradient. Left common femoral artery is very long and profunda bifurcation happens distal than normal, the left common femoral artery has a calcific 80 to 90% stenosis, brisk flow in the left SFA and old left SFA stent is widely patent.  Below the left knee there is two-vessel runoff with occluded left PT.   Right common femoral artery patch angioplasty site is widely patent.  Right SFA shows mild diffuse disease with moderate amount of calcification.  Previously placed right mid SFA stent is widely patent.  There is three-vessel runoff below the right knee.  Intervention data: 1. Left common femoral artery: PTA with Shockwave lithotripsy with a 7.0 x 60 mm shockwave balloon followed by drug-coated balloon angioplasty with a 8 mm x 40 mm Lutonix angioplasty, stenosis reduced from 85% to less than 10% with brisk flow. 2. PTA and self-expanding 10.0 x 40 mm absolute Pro stent to the left common iliac artery followed by post angioplasty with a 9.0 x 40 mm [place] at 8 atmospheric pressure for 60 seconds, stenosis reduced from 80% to 0%.  There was also left external iliac artery old stent that had a 50% stenosis was also stented with the same self-expanding stent overlapped followed by balloon angioplasty.  Overall no residual stenosis.  ABI 09/26/2019:  This exam reveals mildly decreased perfusion of the right lower extremity, noted  at the anterior tibial artery level (ABI 0.83) and mildly decreased  perfusion of the left lower extremity, noted at the anterior tibial artery level (ABI 0.86). Compared to 08/31/2019, bilateral ABI 0.81. (H/O Left CIA stent and left CFA angioplasty).  Right lower extremity venous duplex 12/16/2020: Partially-occluding filling defect within the right tibioperoneal trunk, posterior tibial and peroneal veins, with evidence of partial recanalization (Chronic DVT). Findings likely consistent with chronic right lower extremity DVT.  EKG   EKG 05/29/2021: Normal sinus rhythm at rate of 56 bpm, normal axis, no evidence of ischemia, compared to 01/01/2021, Marked sinus bradycardia 44 bpm.   Assessment     ICD-10-CM   1. Coronary artery disease of native artery of native heart with stable angina pectoris ([place])  I25.118 EKG 12-Lead    2. Primary hypertension  X41 Basic metabolic panel    Azilsartan-Chlorthalidone (EDARBYCLOR) 40-25 MG TABS    3. Hypercholesteremia  E78.00       Meds ordered this encounter  Medications   Azilsartan-Chlorthalidone (EDARBYCLOR) 40-25 MG TABS    Sig: Take 0.5 tablets by mouth every morning.    Dispense:  30 tablet    Refill:  2     Medications Discontinued During This Encounter  Medication Reason   levofloxacin (LEVAQUIN) 750 MG tablet    spironolactone (ALDACTONE) 25 MG tablet Side effect (s)   losartan (COZAAR) 100 MG tablet Ineffective     Orders Placed This Encounter  Procedures   Basic metabolic panel   EKG 01-THYH     Recommendations:   Brian Brock  is a 76 y.o. Caucasian male with CAD with multiple angioplasties over the years, due to restenosis underwent CABG on 09/12/2012 but had recurrence of angina and LIMA occluded needing LAD stenting in 2016.  Past medical history significant for peripheral artery disease with left iliac artery and left SFA stenting in 2014 and drug-coated balloon angioplasty followed by DCB on 09/05/2019 for left  common femoral artery.   Past medical history also significant for hypertension, hyperglycemia, hyperlipidemia, hypothyroidism, spontaneous DVT and pulmonary embolism in January 2020 and has been on Xarelto since then.  He has chronic dyspnea on exertion and COPD with quit tobacco use  in Apr 2022, nocturnal hypoxemia on home O2.  He presents for follow-up of hypertension, since I added amlodipine along with Aldactazide, patient has noticed marked change in his bowel movements especially diarrhea and also has developed breast tenderness.  Also he has noticed leg edema.  I will discontinue Aldactazide, I will switch him to [place] chlor 40/25 mg 1/2 tablet daily, samples given.  With regard to leg edema, patient is not disturbed by this, hence continue amlodipine.  Advised him to use Metamucil on a regular basis to see whether we can regulate his bowel movements.  I will obtain BMP 2 to 3 weeks after he starts the new medication, for now I have advised him to not start Edarbi chlor for 10 days as ARB can also induce diarrhea.  Since discontinuing Bystolic and switching to the present medications, his heart rate has improved from 45 bpm to the present 56.  Patient also states that he feels well.   Adrian Prows, MD, [facility] 05/29/2021, 6:07 PM Office: [phone] Fax: [phone] Pager: [phone]

## 2021-05-30 ENCOUNTER — Ambulatory Visit (INDEPENDENT_AMBULATORY_CARE_PROVIDER_SITE_OTHER): Payer: Medicare Other | Admitting: Internal Medicine

## 2021-05-30 DIAGNOSIS — J449 Chronic obstructive pulmonary disease, unspecified: Secondary | ICD-10-CM

## 2021-05-30 LAB — PULMONARY FUNCTION TEST
DL/VA % pred: 112 %
DL/VA: 4.52 ml/min/mmHg/L
DLCO cor % pred: 112 %
DLCO cor: 26.05 ml/min/mmHg
DLCO unc % pred: 112 %
DLCO unc: 26.05 ml/min/mmHg
FEF 25-75 Post: 2.7 L/sec
FEF 25-75 Pre: 1.81 L/sec
FEF2575-%Change-Post: 48 %
FEF2575-%Pred-Post: 135 %
FEF2575-%Pred-Pre: 90 %
FEV1-%Change-Post: 11 %
FEV1-%Pred-Post: 94 %
FEV1-%Pred-Pre: 84 %
FEV1-Post: 2.6 L
FEV1-Pre: 2.34 L
FEV1FVC-%Change-Post: 4 %
FEV1FVC-%Pred-Pre: 104 %
FEV6-%Change-Post: 6 %
FEV6-%Pred-Post: 92 %
FEV6-%Pred-Pre: 86 %
FEV6-Post: 3.29 L
FEV6-Pre: 3.09 L
FEV6FVC-%Pred-Post: 107 %
FEV6FVC-%Pred-Pre: 107 %
FVC-%Change-Post: 6 %
FVC-%Pred-Post: 86 %
FVC-%Pred-Pre: 80 %
FVC-Post: 3.29 L
FVC-Pre: 3.09 L
Post FEV1/FVC ratio: 79 %
Post FEV6/FVC ratio: 100 %
Pre FEV1/FVC ratio: 76 %
Pre FEV6/FVC Ratio: 100 %
RV % pred: 97 %
RV: 2.36 L
TLC % pred: 84 %
TLC: 5.55 L

## 2021-05-30 NOTE — Progress Notes (Signed)
Full PFT performed today. °

## 2021-05-30 NOTE — Patient Instructions (Signed)
Full PFT performed today. °

## 2021-06-05 NOTE — Progress Notes (Signed)
HPI  M former Smoker (54 pk yrs) followed for  for asthma/ bronchitis complicated by Tobacco use, CAD/ CABG,  PAD/ Claudication, GERD, PE 05/24/2018, Nocturnal Hypoxemia, PAFib, GERD, Covid infection 9/ 2021,  PFT 09/08/12   ? 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 Doesn't remember any problem with levaquin.   CTaPE 12/16/20- IMPRESSION: 1. Negative for pulmonary embolism. 2. Bronchitic markings with right lower lobe atelectasis or bronchopneumonia. CTabd10/13/22- 1. Increased consolidation and bronchiectasis in the medial right lower lobe and new patchy peripheral densities in left lower lobe. Findings are concerning for areas of pneumonia or aspiration. 2. No acute inflammatory process in the abdomen or pelvis. 3. Postoperative changes from right inguinal hernia repair.   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 Needs f/u CXR.  Also sent Trelegy. He feels back to baseline now with no cough.  We reviewed PFT which shows very minimal obstruction without response to bronchodilator.  Previous trials with bronchodilators had not made any difference.  He continues to sleep with oxygen. PFT 05/30/21- minimal obstruction w/o resp to BD. Normal DLCO. FEV1/FVC 0.76   ROS-see HPI + = positive Constitutional:   No-   weight loss, night sweats, fevers, chills, fatigue, lassitude. HEENT:   No-  headaches, difficulty swallowing, tooth/dental problems, sore throat,       No-  sneezing, itching, ear ache, nasal congestion, post nasal drip,  CV:  No-   chest pain, orthopnea, PND, swelling in lower extremities, anasarca, dizziness, palpitations Resp: +-shortness of breath with exertion or at rest.                 productive cough,   non-productive cough,  No- coughing up of blood.              No-   change in color of mucus.  No- wheezing.   Skin: No-   rash or lesions. GI:  No-   heartburn, indigestion, abdominal pain, nausea, vomiting,  GU:  MS:  No-   joint pain or swelling.  . Neuro-     nothing unusual Psych:  No- change in mood or affect. No depression or anxiety.  No memory loss.  OBJ- Physical Exam General- Alert, Oriented, Affect-appropriate, Distress- none acute Skin- rash-none, lesions- none, excoriation- none Lymphadenopathy- none Head- atraumatic            Eyes- Gross vision intact, PERRLA, conjunctivae and secretions clear  Ears- Hearing, canals-normal            Nose- Clear, no-Septal dev, mucus, polyps, erosion, perforation             Throat- Mallampati II , mucosa clear , drainage- none, tonsils- atrophic, dentures Neck- flexible , trachea midline, no stridor , thyroid nl, carotid no bruit Chest - symmetrical excursion , unlabored           Heart/CV- RRR occasional extra beat ,  no murmur , no gallop  , no rub, nl s1 s2                           - JVD- none , edema- none, stasis changes- none, varices- none           Lung-  wheeze-none , +distant, cough- none , dullness-none, rub- none           Chest wall-  Abd-  Br/ Gen/ Rectal- Not done, not indicated Extrem- cyanosis- none, clubbing, none, atrophy- none, strength- nl Neuro- grossly intact to observation

## 2021-06-06 ENCOUNTER — Other Ambulatory Visit: Payer: Self-pay

## 2021-06-06 ENCOUNTER — Encounter: Payer: Self-pay | Admitting: Internal Medicine

## 2021-06-06 ENCOUNTER — Ambulatory Visit (INDEPENDENT_AMBULATORY_CARE_PROVIDER_SITE_OTHER): Payer: Medicare Other

## 2021-06-06 ENCOUNTER — Ambulatory Visit (INDEPENDENT_AMBULATORY_CARE_PROVIDER_SITE_OTHER): Payer: Medicare Other | Admitting: Internal Medicine

## 2021-06-06 VITALS — BP 140/62 | HR 48 | Temp 98.2°F | Ht 69.0 in | Wt 196.8 lb

## 2021-06-06 DIAGNOSIS — J449 Chronic obstructive pulmonary disease, unspecified: Secondary | ICD-10-CM

## 2021-06-06 DIAGNOSIS — J189 Pneumonia, unspecified organism: Secondary | ICD-10-CM

## 2021-06-06 DIAGNOSIS — Z720 Tobacco use: Secondary | ICD-10-CM | POA: Diagnosis not present

## 2021-06-06 DIAGNOSIS — I2510 Atherosclerotic heart disease of native coronary artery without angina pectoris: Secondary | ICD-10-CM

## 2021-06-06 NOTE — Assessment & Plan Note (Signed)
He has had more of a bronchitis pattern in the past, while still smoking at that time.  Since quitting smoking the bronchitis is cleared and pulmonary function tests are remarkably good.  We will check a chest x-ray to look for clearing/residual scarring after pneumonia in October. Plan-CXR

## 2021-06-06 NOTE — Patient Instructions (Addendum)
Order- CXR dx chronic tobacco use, hx pneumonia in October  We can continue O2 2l when you sleep  Glad you are doing so well  Please call if we can help

## 2021-06-06 NOTE — Assessment & Plan Note (Signed)
I emphasized the cardiovascular benefits of staying off of cigarettes.  So far he has been abstinent although he admits he still craves the taste.

## 2021-06-16 NOTE — Progress Notes (Signed)
Spoke with pt and notified of results per Dr. Young Pt verbalized understanding and denied any questions. 

## 2021-06-18 ENCOUNTER — Inpatient Hospital Stay: Payer: Medicare Other | Attending: Hematology & Oncology

## 2021-06-18 ENCOUNTER — Inpatient Hospital Stay (HOSPITAL_BASED_OUTPATIENT_CLINIC_OR_DEPARTMENT_OTHER): Payer: Medicare Other | Admitting: Family

## 2021-06-18 ENCOUNTER — Telehealth: Payer: Self-pay | Admitting: *Deleted

## 2021-06-18 ENCOUNTER — Other Ambulatory Visit: Payer: Self-pay

## 2021-06-18 ENCOUNTER — Encounter: Payer: Self-pay | Admitting: Family

## 2021-06-18 VITALS — BP 138/54 | HR 52 | Temp 97.8°F | Resp 18 | Ht 69.0 in | Wt 195.0 lb

## 2021-06-18 DIAGNOSIS — Z86718 Personal history of other venous thrombosis and embolism: Secondary | ICD-10-CM | POA: Diagnosis present

## 2021-06-18 DIAGNOSIS — I2699 Other pulmonary embolism without acute cor pulmonale: Secondary | ICD-10-CM

## 2021-06-18 DIAGNOSIS — Z79899 Other long term (current) drug therapy: Secondary | ICD-10-CM | POA: Diagnosis not present

## 2021-06-18 DIAGNOSIS — I824Y1 Acute embolism and thrombosis of unspecified deep veins of right proximal lower extremity: Secondary | ICD-10-CM | POA: Diagnosis not present

## 2021-06-18 DIAGNOSIS — Z86711 Personal history of pulmonary embolism: Secondary | ICD-10-CM | POA: Diagnosis present

## 2021-06-18 DIAGNOSIS — Z7901 Long term (current) use of anticoagulants: Secondary | ICD-10-CM | POA: Diagnosis not present

## 2021-06-18 LAB — CMP (CANCER CENTER ONLY)
ALT: 15 U/L (ref 0–44)
AST: 18 U/L (ref 15–41)
Albumin: 4.3 g/dL (ref 3.5–5.0)
Alkaline Phosphatase: 78 U/L (ref 38–126)
Anion gap: 9 (ref 5–15)
BUN: 19 mg/dL (ref 8–23)
CO2: 29 mmol/L (ref 22–32)
Calcium: 9.9 mg/dL (ref 8.9–10.3)
Chloride: 105 mmol/L (ref 98–111)
Creatinine: 1.38 mg/dL — ABNORMAL HIGH (ref 0.61–1.24)
GFR, Estimated: 53 mL/min — ABNORMAL LOW (ref >60)
Glucose, Bld: 107 mg/dL — ABNORMAL HIGH (ref 70–99)
Potassium: 3.9 mmol/L (ref 3.5–5.1)
Sodium: 143 mmol/L (ref 135–145)
Total Bilirubin: 0.6 mg/dL (ref 0.3–1.2)
Total Protein: 7 g/dL (ref 6.5–8.1)

## 2021-06-18 LAB — CBC WITH DIFFERENTIAL (CANCER CENTER ONLY)
Abs Immature Granulocytes: 0.03 10*3/uL (ref 0.00–0.07)
Basophils Absolute: 0.1 10*3/uL (ref 0.0–0.1)
Basophils Relative: 1 %
Eosinophils Absolute: 0.2 10*3/uL (ref 0.0–0.5)
Eosinophils Relative: 2 %
HCT: 44 % (ref 39.0–52.0)
Hemoglobin: 14.4 g/dL (ref 13.0–17.0)
Immature Granulocytes: 0 %
Lymphocytes Relative: 32 %
Lymphs Abs: 3.2 10*3/uL (ref 0.7–4.0)
MCH: 28.4 pg (ref 26.0–34.0)
MCHC: 32.7 g/dL (ref 30.0–36.0)
MCV: 86.8 fL (ref 80.0–100.0)
Monocytes Absolute: 0.8 10*3/uL (ref 0.1–1.0)
Monocytes Relative: 8 %
Neutro Abs: 5.6 10*3/uL (ref 1.7–7.7)
Neutrophils Relative %: 57 %
Platelet Count: 232 10*3/uL (ref 150–400)
RBC: 5.07 MIL/uL (ref 4.22–5.81)
RDW: 14.4 % (ref 11.5–15.5)
WBC Count: 9.9 10*3/uL (ref 4.0–10.5)
nRBC: 0 % (ref 0.0–0.2)

## 2021-06-18 NOTE — Telephone Encounter (Signed)
Per 06/18/21 los - gave upcoming appointments - confirmed °

## 2021-06-18 NOTE — Progress Notes (Signed)
Hematology and Oncology Follow Up Visit  Brian Brock [id number] Mar 04, 1946 76 y.o. 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 He is doing well and has no complaints at this time.  He has had no issue with bleeding. No bruising or petechiae.  He has intermittent mild swelling in his lower extremities. He has recently started Norvasc.  He had to stop taking his Aldactone due to nipple sensitivity.  No fever, chills, n/v, cough, rash, dizziness, SOB, chest pain, palpitations, abdominal pain or changes in bowel or bladder habits.  He has diarrhea often and his PCP recently had him start taking Imodium regularly.  No tenderness, numbness or tingling in his extremities at this time.  No falls or syncope to report.  He is eating well but could hydrate more throughout the day. His weight is stable at 195 lbs.   ECOG Performance Status: 1 - Symptomatic but completely ambulatory  Medications:  Allergies as of 06/18/2021       Reactions   Other Anaphylaxis, Shortness Of Breath, Swelling   [place] nuts- "will kill me"   Clarithromycin Nausea Only   Fenofibrate Micronized Other (See Comments)   Caused weakness/lethargy   Metoprolol Other (See Comments)   Fatigue and malaise   Tricor [fenofibrate] Other (See Comments)   Tired/weak   Sulfa Antibiotics    Other reaction(s): Unknown   Fish Oil Rash   Levofloxacin Other (See Comments)   Doesn't remember   Meloxicam Other (See Comments)   Achy, fatigue   Metaxalone Other (See Comments)   Doesn't remember   Niaspan [niacin] Other (See Comments)   Doesn't remember   Sulfadiazine  Hives, Rash        Medication List        Accurate as of June 18, 2021 10:58 AM. If you have any questions, ask your nurse or doctor.          acetaminophen 500 MG tablet Commonly known as: TYLENOL Take 500-1,000 mg by mouth every 6 (six) hours as needed for moderate pain.   albuterol (2.5 MG/3ML) 0.083% nebulizer solution Commonly known as: PROVENTIL Use 68mL by nebulization every 6 hours and as needed and for wheezing or shortness of breath.   amLODipine 5 MG tablet Commonly known as: NORVASC Take 1 tablet (5 mg total) by mouth daily.   cetirizine 10 MG tablet Commonly known as: ZYRTEC Take 10 mg by mouth daily.   cyclobenzaprine 10 MG tablet Commonly known as: FLEXERIL Take 10 mg by mouth 3 (three) times daily as needed for muscle spasms.   Edarbyclor 40-25 MG Tabs Generic drug: Azilsartan-Chlorthalidone Take 0.5 tablets by mouth every morning.   levothyroxine 25 MCG tablet Commonly known as: SYNTHROID Take 25 mcg by mouth daily before breakfast.   loperamide 2 MG tablet Commonly known as: IMODIUM A-D Take 2 mg by mouth 4 (four) times daily as needed for diarrhea or loose stools.   Melatonin 12 MG Tabs Take 12 mg by mouth at bedtime.   nitroGLYCERIN 0.4 MG SL tablet Commonly known as: NITROSTAT PLACE 1 TABLET UNDER THE TONGUE, MAY REPEAT EVERY 5 MINUTES  MAX 3 TABLETS   OXYGEN Inhale 2 L into the lungs at bedtime.   pantoprazole 40 MG tablet Commonly known as: PROTONIX Take 1 tablet (40 mg total) by mouth 2 (two) times daily. What changed: when to take this   PROBIOTIC PO Take 1 capsule by mouth daily.   rivaroxaban 20 MG Tabs tablet Commonly known as: XARELTO Take 1 tablet (20 mg total) by mouth daily with supper.   rosuvastatin 20 MG tablet Commonly known as: CRESTOR TAKE ONE TABLET BY MOUTH EVERY DAY What changed: how much to take   vitamin C 1000 MG tablet Take 1,000 mg by mouth in the morning and at bedtime.   Vitamin D3 50 MCG  (2000 UT) Tabs Take 2,000 Units by mouth daily.   zolpidem 10 MG tablet Commonly known as: AMBIEN Take 10 mg by mouth at bedtime as needed for sleep.        Allergies:  Allergies  Allergen Reactions   Other Anaphylaxis, Shortness Of Breath and Swelling    [place] nuts- "will kill me"   Clarithromycin Nausea Only   Fenofibrate Micronized Other (See Comments)    Caused weakness/lethargy   Metoprolol Other (See Comments)    Fatigue and malaise   Tricor [Fenofibrate] Other (See Comments)    Tired/weak   Sulfa Antibiotics     Other reaction(s): Unknown   Fish Oil Rash   Levofloxacin Other (See Comments)    Doesn't remember   Meloxicam Other (See Comments)    Achy, fatigue   Metaxalone Other (See Comments)    Doesn't remember   Niaspan [Niacin] Other (See Comments)    Doesn't remember   Sulfadiazine Hives and Rash    Past Medical History, Surgical history, Social history, and Family History were reviewed and updated.  Review of Systems: All other 10 point review of systems is negative.   Physical Exam:  vitals were not taken for this visit.   Wt Readings from Last 3 Encounters:  06/06/21 196 lb 12.8 oz (89.3 kg)  05/29/21 196 lb (88.9 kg)  03/26/21 195 lb (88.5 kg)    Ocular: Sclerae unicteric, pupils equal, round and reactive to light Ear-nose-throat: Oropharynx clear, dentition fair Lymphatic: No cervical or supraclavicular adenopathy Lungs no rales or rhonchi, good excursion bilaterally Heart regular rate and rhythm, no murmur appreciated Abd soft, nontender, positive bowel sounds MSK no focal spinal tenderness, no joint edema Neuro: non-focal, well-oriented, appropriate affect Breasts: Deferred  Lab Results  Component Value Date   WBC 9.9 06/18/2021   HGB 14.4 06/18/2021   HCT 44.0 06/18/2021   MCV 86.8 06/18/2021   PLT 232 06/18/2021   Lab Results  Component Value Date   FERRITIN 135.1 11/03/2017   IRON 102 11/03/2017   IRONPCTSAT 27.5 11/03/2017    Lab Results  Component Value Date   RBC 5.07 06/18/2021   No results found for: KPAFRELGTCHN, LAMBDASER, KAPLAMBRATIO No results found for: IGGSERUM, IGA, IGMSERUM No results found for: Odetta Pink, SPEI   Chemistry      Component Value Date/Time   NA 143 06/18/2021 1008   NA 140 04/16/2021 1415   K 3.9 06/18/2021 1008   CL 105 06/18/2021 1008   CO2 29 06/18/2021 1008   BUN 19 06/18/2021 1008   BUN 17 04/16/2021 1415   CREATININE 1.38 (H) 06/18/2021 1008   CREATININE 1.05 02/12/2016 1008      Component Value Date/Time   CALCIUM 9.9 06/18/2021 1008  ALKPHOS 78 06/18/2021 1008   AST 18 06/18/2021 1008   ALT 15 06/18/2021 1008   BILITOT 0.6 06/18/2021 1008       Impression and Plan: Mr. 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   He continues to do well. No changes needed at this time.  Follow-up in 6 months.   Lottie Dawson, NP 2/1/202310:58 AM

## 2021-07-01 LAB — BASIC METABOLIC PANEL
BUN/Creatinine Ratio: 14 (ref 10–24)
BUN: 21 mg/dL (ref 8–27)
CO2: 22 mmol/L (ref 20–29)
Calcium: 9.2 mg/dL (ref 8.6–10.2)
Chloride: 102 mmol/L (ref 96–106)
Creatinine, Ser: 1.5 mg/dL — ABNORMAL HIGH (ref 0.76–1.27)
Glucose: 137 mg/dL — ABNORMAL HIGH (ref 70–99)
Potassium: 4.5 mmol/L (ref 3.5–5.2)
Sodium: 139 mmol/L (ref 134–144)
eGFR: 48 mL/min/{1.73_m2} — ABNORMAL LOW (ref >59)

## 2021-07-02 ENCOUNTER — Ambulatory Visit: Payer: Medicare Other | Admitting: Cardiology

## 2021-07-02 ENCOUNTER — Encounter: Payer: Self-pay | Admitting: Cardiology

## 2021-07-02 ENCOUNTER — Other Ambulatory Visit: Payer: Self-pay

## 2021-07-02 VITALS — BP 122/52 | HR 64 | Temp 97.9°F | Resp 17 | Ht 69.0 in | Wt 194.0 lb

## 2021-07-02 DIAGNOSIS — N170 Acute kidney failure with tubular necrosis: Secondary | ICD-10-CM

## 2021-07-02 DIAGNOSIS — I10 Essential (primary) hypertension: Secondary | ICD-10-CM

## 2021-07-02 DIAGNOSIS — I25118 Atherosclerotic heart disease of native coronary artery with other forms of angina pectoris: Secondary | ICD-10-CM

## 2021-07-02 DIAGNOSIS — I739 Peripheral vascular disease, unspecified: Secondary | ICD-10-CM

## 2021-07-02 NOTE — Progress Notes (Signed)
Primary Physician/Referring:  [name], MD  Patient ID: Brian Brock, male    DOB: 01-27-1946, 76 y.o.   MRN: [id number]  Chief Complaint  Patient presents with   Follow-up    6 MONTH   Coronary Artery Disease   CAROTID STENOSIS   HPI:    Brian Brock  is a 76 y.o. Caucasian male with CAD with multiple angioplasties over the years, due to restenosis underwent CABG on 09/12/2012 but had recurrence of angina and LIMA occluded needing LAD stenting in 2016.  Past medical history significant for peripheral artery disease with left iliac artery and left SFA stenting in 2014 and drug-coated balloon angioplasty followed by DCB on 09/05/2019 for left common femoral artery.   Past medical history also significant for hypertension, hyperglycemia, hyperlipidemia, hypothyroidism, spontaneous DVT and pulmonary embolism in January 2020 and has been on Xarelto since then.  He has chronic dyspnea on exertion and COPD with quit tobacco use  in Apr 2022, nocturnal hypoxemia on home O2.    Patient is here for a 1 month follow-up visit, continues to have diarrhea, recently has noticed his renal function has deteriorated.  I had started him on Edarbichlor 40/25 mg 1/2 tablet daily a month ago and had discontinued Aldactazide due to breast tenderness.  Past Medical History:  Diagnosis Date   AF (atrial fibrillation) (HCC)    happened after CABG, has not had it since   Allergic rhinitis    Allergy    Anginal pain ([place]) 1997   Arthritis    "lower back" (09/26/2015)   Childhood asthma    Chronic bronchitis (HCC)    Yearly   COPD (chronic obstructive pulmonary disease) ([place])    Coronary artery disease    Multiple stents   Dyslipidemia    Dysrhythmia    Femoral hernia of right side 12/28/2016   GERD (gastroesophageal reflux disease)    History of hiatal hernia    Hyperlipemia    Hypertension    Hypothyroidism    Irritable bowel syndrome    Myocardial infarct ([place]) 1997   "mild"   PE  (pulmonary thromboembolism) ([place]) 05/2018   Peripheral vascular disease ([place])    prior stenting   Pre-diabetes    Restless legs    Past Surgical History:  Procedure Laterality Date   CARDIAC CATHETERIZATION  2004   had remote tandem stents in 1997 and 1998 for LAD lesions, RCA stent in 1997 , midprto=ion, and a proximal RCA  SENT IN 1999, all DES; some were IVIS  guide.   CARDIAC CATHETERIZATION  09/07/2012   demonstrated severe 2- vessel disease,patent LAD,patient circ and patent right stents, but 90% ostial LAD whic was new.   CAROTID DOPPLER  100/24/11   RGT BULB and PROXIMAL ICA 0-49%; LFT. ICA  0-49 ; RGT SUBCLAVIAN ARTERY < 50% ; LFT SUBCLAVIAN normal patency; RGT  and LFT  VERTEBRAL ARTERIES   COLONOSCOPY  11/06/2016   Diverticulosis of colon. Internal hemorrhoids.    CORONARY ANGIOPLASTY     CORONARY ANGIOPLASTY WITH STENT PLACEMENT  '97, '98, '04, '06, '08, '10   seven cardiac stents   CORONARY ARTERY BYPASS GRAFT N/A 09/12/2012   Procedure: OFF PUMP CORONARY ARTERY BYPASS GRAFTING (CABG);  Surgeon: Melrose Nakayama, MD;  Location: [place];  Service: Open Heart Surgery;  Laterality: N/A;  Times two using left internal mammary artery and endoscopically harvested right saphenous vein  ;LIMA-LAD and SVG -OM1   DOPPLER ECHOCARDIOGRAPHY  11/23/2012,09/30/2010  LV EF 50-55%----09/30/2010-LV EF = >55%   ENDARTERECTOMY FEMORAL Right 12/19/2015   Procedure: ENDARTERECTOMY RIGHT FEMORAL ARTERY;  Surgeon: Serafina Mitchell, MD;  Location: [facility] OR;  Service: Vascular;  Laterality: Right;   event monitor  11/04/2012-11/17/2012   pt denies this hx on 07/24/2014   EYE SURGERY Bilateral    cataract   FEMORAL ARTERY STENT Bilateral 7/06  8/08   Lt SFA '06, Rt SFA 8/08   HERNIA REPAIR  10/2008   "repaired w/gallbladder OR"   HERNIA REPAIR  07/2016   INGUINAL HERNIA REPAIR Right 12/28/2016   Procedure: OPEN REPAIR RIGHT GROIN HERNIA WITH MESH;  Surgeon: Fanny Skates, MD;  Location: [place];   Service: General;  Laterality: Right;   INGUINAL HERNIA REPAIR Right 02/21/2020   Procedure: LAPAROSCOPIC RIGHT INGUINAL HERNIA REPAIR WITH MESH;  Surgeon: Ralene Ok, MD;  Location: [place];  Service: General;  Laterality: Right;   INTRAVASCULAR LITHOTRIPSY  09/05/2019   Procedure: INTRAVASCULAR LITHOTRIPSY;  Surgeon: Adrian Prows, MD;  Location: [place] [facility] LAB;  Service: Cardiovascular;;  left common femoral   LAPAROSCOPIC CHOLECYSTECTOMY  June 2010   LEFT HEART CATHETERIZATION WITH CORONARY ANGIOGRAM N/A 09/07/2012   Procedure: LEFT HEART CATHETERIZATION WITH CORONARY ANGIOGRAM;  Surgeon: Troy Sine, MD;  Location[facility] C[facility];  Service: Cardiovascular;  Laterality: N/A;   LEFT HEART CATHETERIZATION WITH CORONARY/GRAFT ANGIOGRAM N/A 07/24/2014   Procedure: LEFT HEART CATHETERIZATION WITH Beatrix Fetters;  Surgeon: Troy Sine, MD; LAD 70%, 50% ISR, mLAD 99%, CFX 30%, patent stent, RCA patent, LIMA-LAD occluded, SVG-OM 1 patent, EF 50-55%   LEXISCAN STRESS TEST  09/06/2012   LOWER ARTERIAL DOPPLER  03/25/2012   FGT CIA 0-49%; RGT CFA AND SFA AT STENT 0-49%; RGT DISTAL SFA = <50%; LFT. EIA  50-69%; LFT. CFA 0-49% ;;LFT. SFA  AT STENT NORMAL PATENCY   LOWER EXTREMITY ANGIOGRAPHY N/A 09/05/2019   Procedure: LOWER EXTREMITY ANGIOGRAPHY;  Surgeon: Adrian Prows, MD;  Location: [place] [facility];  Service: Cardiovascular;  Laterality: N/A;   NM MYOVIEW LTD  05/06/2012   EF -STUDY NOT GATED, NORMAL STRESS NUCLEAR ,NORMAL LV FUNCTION   PATCH ANGIOPLASTY Right 12/19/2015   Procedure: PATCH ANGIOPLASTY RIGHT FEMORAL ARTERY;  Surgeon: Serafina Mitchell, MD;  Location: [place];  Service: Vascular;  Laterality: Right;   PERCUTANEOUS CORONARY STENT INTERVENTION (PCI-S) Right 07/24/2014   Procedure: PERCUTANEOUS CORONARY STENT INTERVENTION (PCI-S);  Surgeon: Troy Sine, MD; ostial LAD PTCA with angioscope scoring balloon, 3.0 x 34 mm Resolute DES extending from the ostium to the mid LAD, covering all  significant stenoses    PERIPHERAL VASCULAR CATHETERIZATION N/A 09/26/2015   Procedure: Abdominal Aortogram w/Lower Extremity;  Surgeon: Lorretta Harp, MD;  Location: [place] [facility];  Service: Cardiovascular;  Laterality: N/A;   PERIPHERAL VASCULAR CATHETERIZATION Right 02/20/2016   Procedure: Peripheral Vascular Atherectomy;  Surgeon: Lorretta Harp, MD;  Location: [place] [facility];  Service: Cardiovascular;  Laterality: Right;  SFA   PERIPHERAL VASCULAR CATHETERIZATION Right 02/20/2016   Procedure: Peripheral Vascular Balloon Angioplasty;  Surgeon: Lorretta Harp, MD;  Location: [place] [facility];  Service: Cardiovascular;  Laterality: Right;  SFA   PERIPHERAL VASCULAR INTERVENTION  09/05/2019   Procedure: PERIPHERAL VASCULAR INTERVENTION;  Surgeon: Adrian Prows, MD;  Location: [place] [facility];  Service: Cardiovascular;;  left common iliac   TONSILLECTOMY  1950's   UPPER GASTROINTESTINAL ENDOSCOPY     Social History   Tobacco Use   Smoking  status: Former    Packs/day: 0.50    Years: 56.00    Pack years: 28.00    Types: Cigarettes    Quit date: 08/23/2020    Years since quitting: 0.8   Smokeless tobacco: Current    Types: Chew  Substance Use Topics   Alcohol use: Yes    Alcohol/week: 1.0 standard drink    Types: 1 Standard drinks or equivalent per week    Comment: 09/26/2015 "varies; might have a few drinks q 2 months"   Marital status: Married   ROS   Review of Systems  Cardiovascular:  Positive for dyspnea on exertion and leg swelling. Negative for chest pain and claudication.  Gastrointestinal:  Negative for melena.  Objective  Blood pressure (!) 122/52, pulse 64, temperature 97.9 F (36.6 C), temperature source Temporal, resp. rate 17, height 5\' 9"  (9.924 m), weight 194 lb (88 kg), SpO2 95 %.  Vitals with BMI 07/02/2021 06/18/2021 06/06/2021  Height 5\' 9"  5\' 9"  5\' 9"   Weight 194 lbs 195 lbs 196 lbs 13 oz  BMI 28.64 26.83 41.96  Systolic 222 979 892  Diastolic 52  54 62  Pulse 64 52 48     Physical Exam Constitutional:      Appearance: He is well-developed.  Neck:     Vascular: Carotid bruit (bilateral) present. No JVD.  Cardiovascular:     Rate and Rhythm: Regular rhythm. Bradycardia present.     Pulses:          Carotid pulses are 2+ on the right side with bruit and 2+ on the left side.      Femoral pulses are 2+ on the right side with bruit and 2+ on the left side with bruit.      Popliteal pulses are 1+ on the right side and 2+ on the left side.       Dorsalis pedis pulses are 1+ on the right side and 1+ on the left side.       Posterior tibial pulses are 0 on the right side and 2+ on the left side.     Heart sounds: Normal heart sounds.  Pulmonary:     Breath sounds: Rales (bialteral left base worse than right. Diffuse scattered) present.  Abdominal:     General: Bowel sounds are normal.     Palpations: Abdomen is soft.  Musculoskeletal:     Right lower leg: Edema (2+ ankle) present.     Left lower leg: Edema (2+ ankle) present.   Laboratory examination:   Recent Labs    12/16/20 0909 02/24/21 0835 04/16/21 1415 06/18/21 1008 06/30/21 0856  NA 142   < > 140 143 139  K 4.2   < > 4.2 3.9 4.5  CL 106   < > 105 105 102  CO2 29   < > 24 29 22   GLUCOSE 147*   < > 102* 107* 137*  BUN 15   < > 17 19 21   CREATININE 1.24   < > 1.24 1.38* 1.50*  CALCIUM 9.5   < > 8.8 9.9 9.2  GFRNONAA >60  --   --  53*  --    < > = values in this interval not displayed.   estimated creatinine clearance is 46.7 mL/min (A) (by C-G formula based on SCr of 1.5 mg/dL (H)).  CMP Latest Ref Rng & Units 06/30/2021 06/18/2021 04/16/2021  Glucose 70 - 99 mg/dL 137(H) 107(H) 102(H)  BUN 8 - 27 mg/dL 21 19 17  Creatinine 0.76 - 1.27 mg/dL 1.50(H) 1.38(H) 1.24  Sodium 134 - 144 mmol/L 139 143 140  Potassium 3.5 - 5.2 mmol/L 4.5 3.9 4.2  Chloride 96 - 106 mmol/L 102 105 105  CO2 20 - 29 mmol/L 22 29 24   Calcium 8.6 - 10.2 mg/dL 9.2 9.9 8.8  Total Protein 6.5  - 8.1 g/dL - 7.0 -  Total Bilirubin 0.3 - 1.2 mg/dL - 0.6 -  Alkaline Phos 38 - 126 U/L - 78 -  AST 15 - 41 U/L - 18 -  ALT 0 - 44 U/L - 15 -   CBC Latest Ref Rng & Units 06/18/2021 02/24/2021 12/16/2020  WBC 4.0 - 10.5 K/uL 9.9 8.7 9.3  Hemoglobin 13.0 - 17.0 g/dL 14.4 13.5 13.5  Hematocrit 39.0 - 52.0 % 44.0 41.3 41.5  Platelets 150 - 400 K/uL 232 175.0 269   Last lipids Lab Results  Component Value Date   CHOL 123 01/02/2021   HDL 36 (L) 01/02/2021   LDLCALC 67 01/02/2021   TRIG 106 01/02/2021   CHOLHDL 2.9 02/07/2018   HEMOGLOBIN A1C Lab Results  Component Value Date   HGBA1C 6.3 (H) 02/14/2020   MPG 134.11 02/14/2020   TSH No results for input(s): TSH in the last 8760 hours.  Allergies   Allergies  Allergen Reactions   Other Anaphylaxis, Shortness Of Breath and Swelling    [place] nuts- "will kill me"   Clarithromycin Nausea Only   Fenofibrate Micronized Other (See Comments)    Caused weakness/lethargy   Metoprolol Other (See Comments)    Fatigue and malaise   Tricor [Fenofibrate] Other (See Comments)    Tired/weak   Fish Oil Rash   Levofloxacin Other (See Comments)    Doesn't remember   Meloxicam Other (See Comments)    Achy, fatigue   Metaxalone Other (See Comments)    Doesn't remember   Niaspan [Niacin] Other (See Comments)    Doesn't remember   Sulfa Antibiotics Other (See Comments)    Other reaction(s): Unknown   Sulfadiazine Hives and Rash   Final Medications at End of Visit     Current Outpatient Medications:    acetaminophen (TYLENOL) 500 MG tablet, Take 500-1,000 mg by mouth every 6 (six) hours as needed for moderate pain. , Disp: , Rfl:    albuterol (PROVENTIL) (2.5 MG/3ML) 0.083% nebulizer solution, Use 14mL by nebulization every 6 hours and as needed and for wheezing or shortness of breath., Disp: 360 mL, Rfl: 6   amLODipine (NORVASC) 5 MG tablet, Take 1 tablet (5 mg total) by mouth daily., Disp: 30 tablet, Rfl: 2   Ascorbic Acid (VITAMIN C)  1000 MG tablet, Take 1,000 mg by mouth in the morning and at bedtime. , Disp: , Rfl:    cetirizine (ZYRTEC) 10 MG tablet, Take 10 mg by mouth daily., Disp: , Rfl:    Cholecalciferol (VITAMIN D3) 50 MCG (2000 UT) TABS, Take 2,000 Units by mouth daily., Disp: , Rfl:    cyclobenzaprine (FLEXERIL) 10 MG tablet, Take 10 mg by mouth 3 (three) times daily as needed for muscle spasms., Disp: , Rfl:    levothyroxine (SYNTHROID, LEVOTHROID) 25 MCG tablet, Take 25 mcg by mouth daily before breakfast. , Disp: , Rfl:    loperamide (IMODIUM A-D) 2 MG tablet, Take 2 mg by mouth 4 (four) times daily as needed for diarrhea or loose stools., Disp: , Rfl:    Melatonin 12 MG TABS, Take 12 mg by mouth at bedtime. , Disp: ,  Rfl:    nitroGLYCERIN (NITROSTAT) 0.4 MG SL tablet, PLACE 1 TABLET UNDER THE TONGUE, MAY REPEAT EVERY 5 MINUTES MAX 3 TABLETS, Disp: 25 tablet, Rfl: 11   OXYGEN, Inhale 2 L into the lungs at bedtime., Disp: , Rfl:    pantoprazole (PROTONIX) 40 MG tablet, Take 1 tablet (40 mg total) by mouth 2 (two) times daily. (Patient taking differently: Take 40 mg by mouth at bedtime.), Disp: 60 tablet, Rfl: 0   Probiotic Product (PROBIOTIC PO), Take 1 capsule by mouth daily., Disp: , Rfl:    rivaroxaban (XARELTO) 20 MG TABS tablet, Take 1 tablet (20 mg total) by mouth daily with supper., Disp: 90 tablet, Rfl: 3   rosuvastatin (CRESTOR) 20 MG tablet, TAKE ONE TABLET BY MOUTH EVERY DAY (Patient taking differently: Take 10 mg by mouth daily.), Disp: 90 tablet, Rfl: 3   zolpidem (AMBIEN) 10 MG tablet, Take 10 mg by mouth at bedtime as needed for sleep. , Disp: , Rfl:     Radiology:   CT angio chest 12/16/2020: Coronary atherosclerosis SP CABG. Mediastinum normal. Lungs show airway thickening bilaterally with intermittent narrowing.  Mucoid impaction in the right lower lobe where there is streaky opacity and volume loss.  Otherwise no significant abnormality.  CT angiogram of the abdomen 02/27/2021: 1.  Atherosclerosis throughout the abdomen and pelvis. Aortic Atherosclerosis (ICD10-I70.0). 2. Left iliac artery stents are patent. Occlusive disease involving the origin and proximal aspect of the internal iliac arteries bilaterally. 3. Mixed plaque involving the origin of the celiac trunk with approximately 50% narrowing. SMA and IMA are patent.   NON-VASCULAR   1. Increased consolidation and bronchiectasis in the medial right lower lobe and new patchy peripheral densities in left lower lobe. Findings are concerning for areas of pneumonia or aspiration.  Cardiac Studies:   Coronary angiogram and stenting to his LAD and RCA  in 1997, 1998, and in 2004 to his proximal left circumflex coronary artery.  Due to recurrent restenosis underwent CABG on 09/12/2012 with off-pump LIMA to LAD and SVG to OM1.  CABG  by Dr. Roxan Hockey on 09/12/2012 with an off-pump LIMA to LAD (Occluded by angoigram in 2016) and SVG to circumflex marginal 1 vessel.  Coronary angiogram 07/24/2014: High-grade stenosis to the proximal LAD, patent circumflex stent, patent SVG to OM1 however LIMA to LAD occluded.  Successful stenting to proximal LAD with 3.0 x 34 mm resolute DES, postdilated to 3.4 mm.  Echocardiogram 08/07/2019:  Normal LV systolic function with visual EF 50-55%. Left ventricle cavity is normal in size. Normal global wall motion. No obvious regional wall  motion abnormalities. Mild concentric hypertrophy of the left ventricle. Indeterminate diastolic filling pattern, normal LAP. Calculated EF 50%.  Left atrial cavity is severely dilated.  Visually, right atrial cavity is mildly dilated.  Mild mitral regurgitation.  Moderate tricuspid regurgitation. No evidence of pulmonary hypertension. RVSP measures 33 mmHg.  Mild pulmonic regurgitation.  IVC is dilated with a respiratory response of >50%.  No significant change compared to previous study 05/25/2018.   Lexiscan (Walking with mod Bruce)Tetrofosmin  Stress Test  08/07/2019: Nondiagnostic ECG stress. Perfusion imaging study is abnormal.  Mild inferior, inferolateral small sized defect consistent with ischemia. Left ventricular systolic function is mildly depressed, LVEF calculated at 49% with inferior hypokinesis. Compared to 10/22/2016 report- normal study.  Intermediate risk study.   Peripheral arteriogram 09/05/19: Abdominal aortogram revealed presence of 2 renal arteries 1 on either sides, widely patent.  Mild atherosclerotic changes abdominal aorta noted.  Bilateral  aortoiliac bifurcation was widely patent with mild calcification. Left external iliac artery stent placed 2017 and right CFA patch angioplasty 2017 patent.  There was diffuse moderate calcification noted throughout the entire iliac artery system.  Bilateral internal iliac arteries are occluded.  There is a old left external iliac artery stent which has a 40 to 50% in-stent restenosis focally without pressure gradient.  Left common iliac artery has a 80% stenosis with a 20 to 30 mm pressure gradient. Left common femoral artery is very long and profunda bifurcation happens distal than normal, the left common femoral artery has a calcific 80 to 90% stenosis, brisk flow in the left SFA and old left SFA stent (2004) is widely patent.  Below the left knee there is two-vessel runoff with occluded left PT.   Right common femoral artery patch angioplasty site is widely patent.  Right SFA shows mild diffuse disease with moderate amount of calcification.  Previously placed right mid SFA stent (2008) is widely patent.  There is three-vessel runoff below the right knee.  Intervention data: 1. Left common femoral artery: PTA with Shockwave lithotripsy with a 7.0 x 60 mm shockwave balloon followed by drug-coated balloon angioplasty with a 8 mm x 40 mm Lutonix angioplasty, stenosis reduced from 85% to less than 10% with brisk flow. 2. PTA and self-expanding 10.0 x 40 mm absolute Pro stent to  the left common iliac artery followed by post angioplasty with a 9.0 x 40 mm Mustang at 8 atmospheric pressure for 60 seconds, stenosis reduced from 80% to 0%.  There was also left external iliac artery old stent that had a 50% stenosis was also stented with the same self-expanding stent overlapped followed by balloon angioplasty.  Overall no residual stenosis.  ABI 09/26/2019:  This exam reveals mildly decreased perfusion of the right lower extremity, noted at the anterior tibial artery level (ABI 0.83) and mildly decreased  perfusion of the left lower extremity, noted at the anterior tibial artery level (ABI 0.86). Compared to 08/31/2019, bilateral ABI 0.81. (H/O Left CIA stent and left CFA angioplasty).  EKG   EKG 05/29/2021: Normal sinus rhythm at rate of 56 bpm, normal axis, no evidence of ischemia, compared to 01/01/2021, Marked sinus bradycardia 44 bpm.   Assessment     ICD-10-CM   1. Coronary artery disease of native artery of native heart with stable angina pectoris ([place])  I25.118     2. Peripheral artery disease  I73.9     3. Primary hypertension  I10     4. Acute renal failure with tubular necrosis (HCC)  N17.0       No orders of the defined types were placed in this encounter.    Medications Discontinued During This Encounter  Medication Reason   predniSONE (DELTASONE) 20 MG tablet    Azilsartan-Chlorthalidone (EDARBYCLOR) 40-25 MG TABS Discontinued by provider     No orders of the defined types were placed in this encounter.    Recommendations:   Brian Brock  is a 76 y.o. Caucasian male with CAD with multiple angioplasties over the years, due to restenosis underwent CABG on 09/12/2012 but had recurrence of angina and LIMA occluded needing LAD stenting in 2016.  Past medical history significant for peripheral artery disease with left iliac artery and left SFA stenting in 2014 and drug-coated balloon angioplasty followed by DCB on 09/05/2019 for left common femoral  artery.   Past medical history also significant for hypertension, hyperglycemia, hyperlipidemia, hypothyroidism, spontaneous DVT and pulmonary embolism  in January 2020 and has been on Xarelto since then.  He has chronic dyspnea on exertion and COPD with quit tobacco use  in Apr 2022, nocturnal hypoxemia on home O2.  Patient is here for a 1 month follow-up visit, continues to have diarrhea, recently has noticed his renal function has deteriorated.  I had started him on Edarbichlor 40/25 mg 1/2 tablet daily a month ago and had discontinued Aldactazide due to breast tenderness.  Due to persistent diarrhea, will discontinue Edarbichlor, advised him  to also discontinue Protonix due to diarrhea, recheck BMP in 3 weeks, I will see him back in 4 weeks for follow-up.  I could consider changing him to amlodipine 10 mg both for diarrhea and for blood pressure control if necessary.  During peripheral arteriogram, there was no renal artery stenosis.  He has not been able to tolerate beta-blockers due to marked bradycardia.  He is presently on 20 mg of Xarelto, we may have to consider changing it to 15 mg if renal function does not improve.     Adrian Prows, MD, [facility] 07/02/2021, 3:14 PM Office: [phone] Fax: [phone] Pager: [phone]

## 2021-07-03 ENCOUNTER — Encounter: Payer: Self-pay | Admitting: Internal Medicine

## 2021-07-03 ENCOUNTER — Other Ambulatory Visit: Payer: Self-pay | Admitting: Cardiology

## 2021-07-03 DIAGNOSIS — J189 Pneumonia, unspecified organism: Secondary | ICD-10-CM | POA: Insufficient documentation

## 2021-07-03 DIAGNOSIS — I10 Essential (primary) hypertension: Secondary | ICD-10-CM

## 2021-07-03 NOTE — Assessment & Plan Note (Signed)
Sample trial Trelegy

## 2021-07-03 NOTE — Assessment & Plan Note (Signed)
Remains on Xarelto Doubt recurrence

## 2021-07-03 NOTE — Assessment & Plan Note (Addendum)
Hopefully we can clear this up this time. Discussed need to avoid any reflux/ aspiration, and potential for C dif w repeated rounds of antibiotics. Plan- levaquin

## 2021-08-05 LAB — BASIC METABOLIC PANEL
BUN/Creatinine Ratio: 18 (ref 10–24)
BUN: 21 mg/dL (ref 8–27)
CO2: 23 mmol/L (ref 20–29)
Calcium: 8.6 mg/dL (ref 8.6–10.2)
Chloride: 106 mmol/L (ref 96–106)
Creatinine, Ser: 1.2 mg/dL (ref 0.76–1.27)
Glucose: 119 mg/dL — ABNORMAL HIGH (ref 70–99)
Potassium: 4 mmol/L (ref 3.5–5.2)
Sodium: 142 mmol/L (ref 134–144)
eGFR: 63 mL/min/{1.73_m2} (ref >59)

## 2021-08-06 ENCOUNTER — Encounter: Payer: Self-pay | Admitting: Cardiology

## 2021-08-06 ENCOUNTER — Ambulatory Visit: Payer: Medicare Other | Admitting: Cardiology

## 2021-08-06 ENCOUNTER — Other Ambulatory Visit: Payer: Self-pay

## 2021-08-06 VITALS — BP 138/79 | HR 56 | Temp 98.6°F | Resp 16 | Ht 69.0 in | Wt 194.6 lb

## 2021-08-06 DIAGNOSIS — I25118 Atherosclerotic heart disease of native coronary artery with other forms of angina pectoris: Secondary | ICD-10-CM

## 2021-08-06 DIAGNOSIS — I10 Essential (primary) hypertension: Secondary | ICD-10-CM

## 2021-08-06 MED ORDER — NITROGLYCERIN 0.4 MG SL SUBL: 0.4000 mg | SUBLINGUAL_TABLET | SUBLINGUAL | 11 refills | Status: AC | PRN

## 2021-08-06 NOTE — Progress Notes (Signed)
? ?Primary Physician/Referring:  [name], MD ? ?Patient ID: Brian Brock, male    DOB: 22-Nov-1945, 76 y.o.   MRN: [id number] ? ?Chief Complaint  ?Patient presents with  ? Acute Renal Failure  ? Hypertension  ? Follow-up  ?  4 weeks  ? ?HPI:   ? ?Brian Brock  is a 76 y.o. Caucasian male with CAD with multiple angioplasties over the years, due to restenosis underwent CABG on 09/12/2012 but had recurrence of angina and LIMA occluded needing LAD stenting in 2016.  Past medical history significant for peripheral artery disease with left iliac artery and left SFA stenting in 2014 and drug-coated balloon angioplasty followed by DCB on 09/05/2019 for left common femoral artery.  ? ?Past medical history also significant for hypertension, hyperglycemia, hyperlipidemia, hypothyroidism, spontaneous DVT and pulmonary embolism in January 2020 and has been on Xarelto since then.  He has chronic dyspnea on exertion and COPD with quit tobacco use  in Apr 2022, nocturnal hypoxemia on home O2.   ? ?Patient is here for a 1 month follow-up visit, due to frequent diarrhea, I discontinued Edarbi chlor and also he had worsening renal function, obtain BMP and presents for follow-up.  Continues to have diarrhea, but has improved.  He also occasionally has constipation.    ? ?Past Medical History:  ?Diagnosis Date  ? AF (atrial fibrillation) ([place])   ? happened after CABG, has not had it since  ? Allergic rhinitis   ? Allergy   ? Anginal pain ([place]) 1997  ? Arthritis   ? "lower back" (09/26/2015)  ? Childhood asthma   ? Chronic bronchitis ([place])   ? Yearly  ? COPD (chronic obstructive pulmonary disease) ([place])   ? Coronary artery disease   ? Multiple stents  ? Dyslipidemia   ? Dysrhythmia   ? Femoral hernia of right side 12/28/2016  ? GERD (gastroesophageal reflux disease)   ? History of hiatal hernia   ? Hyperlipemia   ? Hypertension   ? Hypothyroidism   ? Irritable bowel syndrome   ? Myocardial infarct [facility]) 1997  ? "mild"  ? PE  (pulmonary thromboembolism) ([place]) 05/2018  ? Peripheral vascular disease ([place])   ? prior stenting  ? Pre-diabetes   ? Restless legs   ? ? ?Social History  ? ?Tobacco Use  ? Smoking status: Former  ?  Packs/day: 0.50  ?  Years: 56.00  ?  Pack years: 28.00  ?  Types: Cigarettes  ?  Quit date: 08/23/2020  ?  Years since quitting: 0.9  ? Smokeless tobacco: Current  ?  Types: Chew  ?Substance Use Topics  ? Alcohol use: Yes  ?  Alcohol/week: 1.0 standard drink  ?  Types: 1 Standard drinks or equivalent per week  ?  Comment: 09/26/2015 "varies; might have a few drinks q 2 months"  ? ?Marital status: Married  ? ?ROS  ? ?Review of Systems  ?Cardiovascular:  Positive for dyspnea on exertion and leg swelling. Negative for chest pain and claudication.  ?Gastrointestinal:  Negative for melena.  ?Objective  ?Blood pressure 138/79, pulse (!) 56, temperature 98.6 ?F (37 ?C), temperature source Temporal, resp. rate 16, height '5\' 9"'$  (1.753 m), weight 194 lb 9.6 oz (88.3 kg), SpO2 95 %.  ? ?  08/06/2021  ?  2:44 PM 07/02/2021  ?  2:45 PM 06/18/2021  ? 11:12 AM  ?Vitals with BMI  ?Height '5\' 9"'$  '5\' 9"'$  '5\' 9"'$   ?Weight 194 lbs 10 oz  194 lbs 195 lbs  ?BMI 28.72 28.64 28.78  ?Systolic 151 761 607  ?Diastolic 79 52 54  ?Pulse 56 64 52  ?  ? ?Physical Exam ?Constitutional:   ?   Appearance: He is well-developed.  ?Neck:  ?   Vascular: Carotid bruit (bilateral) present. No JVD.  ?Cardiovascular:  ?   Rate and Rhythm: Regular rhythm. Bradycardia present.  ?   Pulses:     ?     Carotid pulses are 2+ on the right side with bruit and 2+ on the left side. ?     Femoral pulses are 2+ on the right side with bruit and 2+ on the left side with bruit. ?     Popliteal pulses are 1+ on the right side and 2+ on the left side.  ?     Dorsalis pedis pulses are 1+ on the right side and 1+ on the left side.  ?     Posterior tibial pulses are 0 on the right side and 2+ on the left side.  ?   Heart sounds: Normal heart sounds.  ?Pulmonary:  ?   Effort: Pulmonary effort  is normal.  ?   Breath sounds: Normal breath sounds.  ?Abdominal:  ?   General: Bowel sounds are normal.  ?   Palpations: Abdomen is soft.  ?Musculoskeletal:  ?   Right lower leg: Edema (2+ ankle) present.  ?   Left lower leg: Edema (2+ ankle) present.  ? ?Laboratory examination:  ? ?Recent Labs  ?  12/16/20 ?0909 02/24/21 ?3710 06/18/21 ?1008 06/30/21 ?6269 08/04/21 ?1113  ?NA 142   < > 143 139 142  ?K 4.2   < > 3.9 4.5 4.0  ?CL 106   < > 105 102 106  ?CO2 29   < > '29 22 23  '$ ?GLUCOSE 147*   < > 107* 137* 119*  ?BUN 15   < > '19 21 21  '$ ?CREATININE 1.24   < > 1.38* 1.50* 1.20  ?CALCIUM 9.5   < > 9.9 9.2 8.6  ?GFRNONAA >60  --  53*  --   --   ? < > = values in this interval not displayed.  ? ?estimated creatinine clearance is 58.5 mL/min (by C-G formula based on SCr of 1.2 mg/dL).  ? ?  Latest Ref Rng & Units 08/04/2021  ? 11:13 AM 06/30/2021  ?  8:56 AM 06/18/2021  ? 10:08 AM  ?CMP  ?Glucose 70 - 99 mg/dL 119   137   107    ?BUN 8 - 27 mg/dL '21   21   19    '$ ?Creatinine 0.76 - 1.27 mg/dL 1.20   1.50   1.38    ?Sodium 134 - 144 mmol/L 142   139   143    ?Potassium 3.5 - 5.2 mmol/L 4.0   4.5   3.9    ?Chloride 96 - 106 mmol/L 106   102   105    ?CO2 20 - 29 mmol/L '23   22   29    '$ ?Calcium 8.6 - 10.2 mg/dL 8.6   9.2   9.9    ?Total Protein 6.5 - 8.1 g/dL   7.0    ?Total Bilirubin 0.3 - 1.2 mg/dL   0.6    ?Alkaline Phos 38 - 126 U/L   78    ?AST 15 - 41 U/L   18    ?ALT 0 - 44 U/L   15    ? ? ?  Latest Ref Rng & Units 06/18/2021  ? 10:08 AM 02/24/2021  ?  8:35 AM 12/16/2020  ?  9:09 AM  ?CBC  ?WBC 4.0 - 10.5 K/uL 9.9   8.7   9.3    ?Hemoglobin 13.0 - 17.0 g/dL 14.4   13.5   13.5    ?Hematocrit 39.0 - 52.0 % 44.0   41.3   41.5    ?Platelets 150 - 400 K/uL 232   175.0   269    ? ?Last lipids ?Lab Results  ?Component Value Date  ? CHOL 123 01/02/2021  ? HDL 36 (L) 01/02/2021  ? [place] 67 01/02/2021  ? TRIG 106 01/02/2021  ? CHOLHDL 2.9 02/07/2018  ? ?HEMOGLOBIN A1C ?Lab Results  ?Component Value Date  ? HGBA1C 6.3 (H) 02/14/2020  ?  MPG 134.11 02/14/2020  ? ?TSH ?No results for input(s): TSH in the last 8760 hours. ? ?Allergies  ? ?Allergies  ?Allergen Reactions  ? Other Anaphylaxis, Shortness Of Breath and Swelling  ?  [place] nuts- "will kill me"  ? Spironolactone Other (See Comments)  ?  Mastodynia  ? Clarithromycin Nausea Only  ? Fenofibrate Micronized Other (See Comments)  ?  Caused weakness/lethargy  ? Metoprolol Other (See Comments)  ?  Fatigue and malaise  ? Tricor [Fenofibrate] Other (See Comments)  ?  Tired/weak  ? Fish Oil Rash  ? Levofloxacin Other (See Comments)  ?  Doesn't remember  ? Meloxicam Other (See Comments)  ?  Achy, fatigue  ? Metaxalone Other (See Comments)  ?  Doesn't remember  ? Niaspan [Niacin] Other (See Comments)  ?  Doesn't remember  ? Sulfa Antibiotics Other (See Comments)  ?  Other reaction(s): Unknown  ? Sulfadiazine Hives and Rash  ? ?Final Medications at End of Visit   ? ? ?Current Outpatient Medications:  ?  acetaminophen (TYLENOL) 500 MG tablet, Take 500-1,000 mg by mouth every 6 (six) hours as needed for moderate pain. , Disp: , Rfl:  ?  albuterol (PROVENTIL) (2.5 MG/3ML) 0.083% nebulizer solution, Use 52m by nebulization every 6 hours and as needed and for wheezing or shortness of breath., Disp: 360 mL, Rfl: 6 ?  amLODipine (NORVASC) 5 MG tablet, TAKE 1 TABLET ONCE DAILY (Patient taking differently: Take 5 mg by mouth daily as needed.), Disp: 30 tablet, Rfl: 2 ?  Ascorbic Acid (VITAMIN C) 1000 MG tablet, Take 1,000 mg by mouth in the morning and at bedtime. , Disp: , Rfl:  ?  cetirizine (ZYRTEC) 10 MG tablet, Take 10 mg by mouth daily., Disp: , Rfl:  ?  Cholecalciferol (VITAMIN D3) 50 MCG (2000 UT) TABS, Take 2,000 Units by mouth daily., Disp: , Rfl:  ?  cyclobenzaprine (FLEXERIL) 10 MG tablet, Take 10 mg by mouth 3 (three) times daily as needed for muscle spasms., Disp: , Rfl:  ?  levothyroxine (SYNTHROID, LEVOTHROID) 25 MCG tablet, Take 25 mcg by mouth daily before breakfast. , Disp: , Rfl:  ?  loperamide  (IMODIUM A-D) 2 MG tablet, Take 2 mg by mouth 4 (four) times daily as needed for diarrhea or loose stools., Disp: , Rfl:  ?  Melatonin 12 MG TABS, Take 12 mg by mouth at bedtime. , Disp: , Rfl:  ?  O

## 2021-11-05 ENCOUNTER — Encounter: Payer: Self-pay | Admitting: Cardiology

## 2021-11-05 ENCOUNTER — Ambulatory Visit: Payer: Medicare Other | Admitting: Cardiology

## 2021-11-05 VITALS — BP 115/61 | HR 60 | Temp 97.3°F | Resp 16 | Ht 69.0 in | Wt 190.0 lb

## 2021-11-05 DIAGNOSIS — I10 Essential (primary) hypertension: Secondary | ICD-10-CM

## 2021-11-05 DIAGNOSIS — I25118 Atherosclerotic heart disease of native coronary artery with other forms of angina pectoris: Secondary | ICD-10-CM

## 2021-11-05 DIAGNOSIS — B370 Candidal stomatitis: Secondary | ICD-10-CM

## 2021-11-05 DIAGNOSIS — I739 Peripheral vascular disease, unspecified: Secondary | ICD-10-CM

## 2021-11-05 MED ORDER — FLUCONAZOLE 100 MG PO TABS: 100.0000 mg | ORAL_TABLET | Freq: Every day | ORAL | 0 refills | Status: DC

## 2021-11-05 NOTE — Progress Notes (Signed)
Primary Physician/Referring:  [name], MD  Patient ID: Brian Brock, male    DOB: 06-18-1945, 76 y.o.   MRN: [id number]  Chief Complaint  Patient presents with   Coronary Artery Disease   Hypertension   Follow-up    3 months   HPI:    Brian Brock  is a 76 y.o. Caucasian male with CAD with multiple angioplasties over the years, due to restenosis underwent CABG on 09/12/2012 but had recurrence of angina and LIMA occluded needing LAD stenting in 2016.  Past medical history significant for peripheral artery disease with left iliac artery and left SFA stenting in 2014 and drug-coated balloon angioplasty followed by DCB on 09/05/2019 for left common femoral artery.   Past medical history also significant for hypertension, hyperglycemia, hyperlipidemia, hypothyroidism, spontaneous DVT and pulmonary embolism in January 2020 and has been on Xarelto since then.  He has chronic dyspnea on exertion and COPD with quit tobacco use  in Apr 2022, nocturnal hypoxemia on home O2.    Patient is here for a 3 month follow-up visit, states that he is feeling the best he has in quite a while.  He has had 1 episode of angina pectoris a week ago.  Dyspnea has remained stable, has mild leg edema.  Past Medical History:  Diagnosis Date   AF (atrial fibrillation) ([facility])    happened after CABG, has not had it since   Allergic rhinitis    Allergy    Anginal pain ([place]) 1997   Arthritis    "lower back" (09/26/2015)   Childhood asthma    Chronic bronchitis ([facility])    Yearly   COPD (chronic obstructive pulmonary disease) ([facility])    Coronary artery disease    Multiple stents   Dyslipidemia    Dysrhythmia    Femoral hernia of right side 12/28/2016   GERD (gastroesophageal reflux disease)    History of hiatal hernia    Hyperlipemia    Hypertension    Hypothyroidism    Irritable bowel syndrome    Myocardial infarct ([place]) 1997   "mild"   PE (pulmonary thromboembolism) ([place]) 05/2018   Peripheral  vascular disease ([place])    prior stenting   Pre-diabetes    Restless legs     Social History   Tobacco Use   Smoking status: Former    Packs/day: 0.50    Years: 56.00    Total pack years: 28.00    Types: Cigarettes    Quit date: 08/23/2020    Years since quitting: 1.2   Smokeless tobacco: Current    Types: Chew  Substance Use Topics   Alcohol use: Yes    Alcohol/week: 1.0 standard drink of alcohol    Types: 1 Standard drinks or equivalent per week    Comment: 09/26/2015 "varies; might have a few drinks q 2 months"   Marital status: Married   ROS   Review of Systems  Cardiovascular:  Positive for dyspnea on exertion and leg swelling. Negative for chest pain and claudication.  Gastrointestinal:  Negative for melena.   Objective  Blood pressure 115/61, pulse 60, temperature (!) 97.3 F (36.3 C), temperature source Temporal, resp. rate 16, height '5\' 9"'$  (1.753 m), weight 190 lb (86.2 kg), SpO2 96 %.     11/05/2021    2:02 PM 08/06/2021    2:44 PM 07/02/2021    2:45 PM  Vitals with BMI  Height '5\' 9"'$  '5\' 9"'$  '5\' 9"'$   Weight 190 lbs 194 lbs 10  oz 194 lbs  BMI 28.05 31.49 70.26  Systolic 378 588 502  Diastolic 61 79 52  Pulse 60 56 64     Physical Exam Constitutional:      Appearance: He is well-developed.  Neck:     Vascular: Carotid bruit (bilateral) present. No JVD.  Cardiovascular:     Rate and Rhythm: Regular rhythm. Bradycardia present.     Pulses:          Carotid pulses are 2+ on the right side with bruit and 2+ on the left side.      Femoral pulses are 2+ on the right side with bruit and 2+ on the left side with bruit.      Popliteal pulses are 1+ on the right side and 2+ on the left side.       Dorsalis pedis pulses are 1+ on the right side and 1+ on the left side.       Posterior tibial pulses are 0 on the right side and 2+ on the left side.     Heart sounds: Normal heart sounds.  Pulmonary:     Effort: Pulmonary effort is normal.     Breath sounds: Normal  breath sounds.  Abdominal:     General: Bowel sounds are normal.     Palpations: Abdomen is soft.  Musculoskeletal:     Right lower leg: Edema (2 + ankle only) present.     Left lower leg: Edema (2+ ankle only) present.    Laboratory examination:   Recent Labs    12/16/20 0909 02/24/21 0835 06/18/21 1008 06/30/21 0856 08/04/21 1113  NA 142   < > 143 139 142  K 4.2   < > 3.9 4.5 4.0  CL 106   < > 105 102 106  CO2 29   < > '29 22 23  '$ GLUCOSE 147*   < > 107* 137* 119*  BUN 15   < > '19 21 21  '$ CREATININE 1.24   < > 1.38* 1.50* 1.20  CALCIUM 9.5   < > 9.9 9.2 8.6  GFRNONAA >60  --  53*  --   --    < > = values in this interval not displayed.   CrCl cannot be calculated (Patient's most recent lab result is older than the maximum 21 days allowed.).     Latest Ref Rng & Units 08/04/2021   11:13 AM 06/30/2021    8:56 AM 06/18/2021   10:08 AM  CMP  Glucose 70 - 99 mg/dL 119  137  107   BUN 8 - 27 mg/dL '21  21  19   '$ Creatinine 0.76 - 1.27 mg/dL 1.20  1.50  1.38   Sodium 134 - 144 mmol/L 142  139  143   Potassium 3.5 - 5.2 mmol/L 4.0  4.5  3.9   Chloride 96 - 106 mmol/L 106  102  105   CO2 20 - 29 mmol/L '23  22  29   '$ Calcium 8.6 - 10.2 mg/dL 8.6  9.2  9.9   Total Protein 6.5 - 8.1 g/dL   7.0   Total Bilirubin 0.3 - 1.2 mg/dL   0.6   Alkaline Phos 38 - 126 U/L   78   AST 15 - 41 U/L   18   ALT 0 - 44 U/L   15       Latest Ref Rng & Units 06/18/2021   10:08 AM 02/24/2021    8:35 AM 12/16/2020  9:09 AM  CBC  WBC 4.0 - 10.5 K/uL 9.9  8.7  9.3   Hemoglobin 13.0 - 17.0 g/dL 14.4  13.5  13.5   Hematocrit 39.0 - 52.0 % 44.0  41.3  41.5   Platelets 150 - 400 K/uL 232  175.0  269    Last lipids Lab Results  Component Value Date   CHOL 123 01/02/2021   HDL 36 (L) 01/02/2021   LDLCALC 67 01/02/2021   TRIG 106 01/02/2021   CHOLHDL 2.9 02/07/2018   HEMOGLOBIN A1C Lab Results  Component Value Date   HGBA1C 6.3 (H) 02/14/2020   MPG 134.11 02/14/2020   TSH No results for  input(s): "TSH" in the last 8760 hours.  Allergies   Allergies  Allergen Reactions   Other Anaphylaxis, Shortness Of Breath and Swelling    [place] nuts- "will kill me"   Spironolactone Other (See Comments)    Mastodynia   Clarithromycin Nausea Only   Fenofibrate Micronized Other (See Comments)    Caused weakness/lethargy   Metoprolol Other (See Comments)    Fatigue and malaise   Tricor [Fenofibrate] Other (See Comments)    Tired/weak   Fish Oil Rash   Levofloxacin Other (See Comments)    Doesn't remember   Meloxicam Other (See Comments)    Achy, fatigue   Metaxalone Other (See Comments)    Doesn't remember   Niaspan [Niacin] Other (See Comments)    Doesn't remember   Sulfa Antibiotics Other (See Comments)    Other reaction(s): Unknown   Sulfadiazine Hives and Rash   Final Medications at End of Visit     Current Outpatient Medications:    acetaminophen (TYLENOL) 500 MG tablet, Take 500-1,000 mg by mouth every 6 (six) hours as needed for moderate pain. , Disp: , Rfl:    albuterol (PROVENTIL) (2.5 MG/3ML) 0.083% nebulizer solution, Use 79m by nebulization every 6 hours and as needed and for wheezing or shortness of breath., Disp: 360 mL, Rfl: 6   amLODipine (NORVASC) 5 MG tablet, TAKE 1 TABLET ONCE DAILY (Patient taking differently: Take 5 mg by mouth daily as needed.), Disp: 30 tablet, Rfl: 2   Ascorbic Acid (VITAMIN C) 1000 MG tablet, Take 1,000 mg by mouth in the morning and at bedtime. , Disp: , Rfl:    cetirizine (ZYRTEC) 10 MG tablet, Take 10 mg by mouth daily., Disp: , Rfl:    Cholecalciferol (VITAMIN D3) 50 MCG (2000 UT) TABS, Take 2,000 Units by mouth daily., Disp: , Rfl:    cyclobenzaprine (FLEXERIL) 10 MG tablet, Take 10 mg by mouth 3 (three) times daily as needed for muscle spasms., Disp: , Rfl:    fluconazole (DIFLUCAN) 100 MG tablet, Take 1 tablet (100 mg total) by mouth daily., Disp: 7 tablet, Rfl: 0   levothyroxine (SYNTHROID, LEVOTHROID) 25 MCG tablet, Take 25  mcg by mouth daily before breakfast. , Disp: , Rfl:    loperamide (IMODIUM A-D) 2 MG tablet, Take 2 mg by mouth 4 (four) times daily as needed for diarrhea or loose stools., Disp: , Rfl:    Melatonin 12 MG TABS, Take 12 mg by mouth at bedtime. , Disp: , Rfl:    nitroGLYCERIN (NITROSTAT) 0.4 MG SL tablet, Place 1 tablet (0.4 mg total) under the tongue every 5 (five) minutes as needed for chest pain., Disp: 25 tablet, Rfl: 11   OXYGEN, Inhale 2 L into the lungs at bedtime., Disp: , Rfl:    pantoprazole (PROTONIX) 40 MG tablet, Take 1 tablet (  40 mg total) by mouth 2 (two) times daily. (Patient taking differently: Take 40 mg by mouth at bedtime.), Disp: 60 tablet, Rfl: 0   Probiotic Product (PROBIOTIC PO), Take 1 capsule by mouth daily., Disp: , Rfl:    rivaroxaban (XARELTO) 20 MG TABS tablet, Take 1 tablet (20 mg total) by mouth daily with supper., Disp: 90 tablet, Rfl: 3   rosuvastatin (CRESTOR) 20 MG tablet, TAKE ONE TABLET BY MOUTH EVERY DAY (Patient taking differently: Take 10 mg by mouth daily.), Disp: 90 tablet, Rfl: 3   zolpidem (AMBIEN) 10 MG tablet, Take 10 mg by mouth at bedtime as needed for sleep. , Disp: , Rfl:   Radiology:   CT angio chest 12/16/2020: Coronary atherosclerosis SP CABG. Mediastinum normal. Lungs show airway thickening bilaterally with intermittent narrowing.  Mucoid impaction in the right lower lobe where there is streaky opacity and volume loss.  Otherwise no significant abnormality.  CT angiogram of the abdomen 02/27/2021: 1. Atherosclerosis throughout the abdomen and pelvis. Aortic Atherosclerosis (ICD10-I70.0). 2. Left iliac artery stents are patent. Occlusive disease involving the origin and proximal aspect of the internal iliac arteries bilaterally. 3. Mixed plaque involving the origin of the celiac trunk with approximately 50% narrowing. SMA and IMA are patent.   NON-VASCULAR   1. Increased consolidation and bronchiectasis in the medial right lower lobe  and new patchy peripheral densities in left lower lobe. Findings are concerning for areas of pneumonia or aspiration.  Cardiac Studies:   Coronary angiogram and stenting to his LAD and RCA  in 1997, 1998, and in 2004 to his proximal left circumflex coronary artery.  Due to recurrent restenosis underwent CABG on 09/12/2012 with off-pump LIMA to LAD and SVG to OM1.  CABG  by Dr. Roxan Hockey on 09/12/2012 with an off-pump LIMA to LAD (Occluded by angoigram in 2016) and SVG to circumflex marginal 1 vessel.  Coronary angiogram 07/24/2014: High-grade stenosis to the proximal LAD, patent circumflex stent, patent SVG to OM1 however LIMA to LAD occluded.  Successful stenting to proximal LAD with 3.0 x 34 mm resolute DES, postdilated to 3.4 mm.  Echocardiogram 08/07/2019:  Normal LV systolic function with visual EF 50-55%. Left ventricle cavity is normal in size. Normal global wall motion. No obvious regional wall  motion abnormalities. Mild concentric hypertrophy of the left ventricle. Indeterminate diastolic filling pattern, normal LAP. Calculated EF 50%.  Left atrial cavity is severely dilated.  Visually, right atrial cavity is mildly dilated.  Mild mitral regurgitation.  Moderate tricuspid regurgitation. No evidence of pulmonary hypertension. RVSP measures 33 mmHg.  Mild pulmonic regurgitation.  IVC is dilated with a respiratory response of >50%.  No significant change compared to previous study 05/25/2018.   Lexiscan (Walking with mod Bruce)Tetrofosmin Stress Test  08/07/2019: Nondiagnostic ECG stress. Perfusion imaging study is abnormal.  Mild inferior, inferolateral small sized defect consistent with ischemia. Left ventricular systolic function is mildly depressed, LVEF calculated at 49% with inferior hypokinesis. Compared to 10/22/2016 report- normal study.  Intermediate risk study.   Peripheral arteriogram 09/05/19: Abdominal aortogram revealed presence of 2 renal arteries 1 on either  sides, widely patent.  Mild atherosclerotic changes abdominal aorta noted.  Bilateral aortoiliac bifurcation was widely patent with mild calcification. Left external iliac artery stent placed 2017 and right CFA patch angioplasty 2017 patent.  There was diffuse moderate calcification noted throughout the entire iliac artery system.  Bilateral internal iliac arteries are occluded.  There is a old left external iliac artery stent which has a 40  to 50% in-stent restenosis focally without pressure gradient.  Left common iliac artery has a 80% stenosis with a 20 to 30 mm pressure gradient. Left common femoral artery is very long and profunda bifurcation happens distal than normal, the left common femoral artery has a calcific 80 to 90% stenosis, brisk flow in the left SFA and old left SFA stent (2004) is widely patent.  Below the left knee there is two-vessel runoff with occluded left PT.   Right common femoral artery patch angioplasty site is widely patent.  Right SFA shows mild diffuse disease with moderate amount of calcification.  Previously placed right mid SFA stent (2008) is widely patent.  There is three-vessel runoff below the right knee.  Intervention data: 1. Left common femoral artery: PTA with Shockwave lithotripsy with a 7.0 x 60 mm shockwave balloon followed by drug-coated balloon angioplasty with a 8 mm x 40 mm Lutonix angioplasty, stenosis reduced from 85% to less than 10% with brisk flow. 2. PTA and self-expanding 10.0 x 40 mm absolute Pro stent to the left common iliac artery followed by post angioplasty with a 9.0 x 40 mm [place] at 8 atmospheric pressure for 60 seconds, stenosis reduced from 80% to 0%.  There was also left external iliac artery old stent that had a 50% stenosis was also stented with the same self-expanding stent overlapped followed by balloon angioplasty.  Overall no residual stenosis.  ABI 09/26/2019:  This exam reveals mildly decreased perfusion of the right lower  extremity, noted at the anterior tibial artery level (ABI 0.83) and mildly decreased  perfusion of the left lower extremity, noted at the anterior tibial artery level (ABI 0.86). Compared to 08/31/2019, bilateral ABI 0.81. (H/O Left CIA stent and left CFA angioplasty).  EKG   EKG 05/29/2021: Normal sinus rhythm at rate of 56 bpm, normal axis, no evidence of ischemia, compared to 01/01/2021, Marked sinus bradycardia 44 bpm.   Assessment     ICD-10-CM   1. Coronary artery disease of native artery of native heart with stable angina pectoris ([place])  I25.118     2. Peripheral artery disease  I73.9     3. Primary hypertension  I10     4. Oral thrush  B37.0 fluconazole (DIFLUCAN) 100 MG tablet      Meds ordered this encounter  Medications   fluconazole (DIFLUCAN) 100 MG tablet    Sig: Take 1 tablet (100 mg total) by mouth daily.    Dispense:  7 tablet    Refill:  0   There are no discontinued medications.  No orders of the defined types were placed in this encounter.  Recommendations:   Brian Brock  is a 76 y.o. Caucasian male with CAD with multiple angioplasties over the years, due to restenosis underwent CABG on 09/12/2012 but had recurrence of angina and LIMA occluded needing LAD stenting in 2016.  Past medical history significant for peripheral artery disease with left iliac artery and left SFA stenting in 2014 and drug-coated balloon angioplasty followed by DCB on 09/05/2019 for left common femoral artery.   Past medical history also significant for hypertension, hyperglycemia, hyperlipidemia, hypothyroidism, spontaneous DVT and pulmonary embolism in January 2020 and has been on Xarelto since then.  He has chronic dyspnea on exertion and COPD with quit tobacco use  in Apr 2022, nocturnal hypoxemia on home O2.  He is here for a 3 month office visit, he is presently doing well and essentially asymptomatic.  His blood pressure is now well  controlled, lipids are at goal and he has had  1 episode of angina pectoris.  No clinical evidence of heart failure.  Symptoms of claudication have remained stable.  No changes in the medications were done today, I will see him back in 6 months.   Adrian Prows, MD, [facility] 11/05/2021, 3:08 PM Office: [phone] Fax: [phone] Pager: [phone]

## 2021-12-16 ENCOUNTER — Encounter: Payer: Self-pay | Admitting: Family

## 2021-12-16 ENCOUNTER — Inpatient Hospital Stay (HOSPITAL_BASED_OUTPATIENT_CLINIC_OR_DEPARTMENT_OTHER): Payer: Medicare Other | Admitting: Family

## 2021-12-16 ENCOUNTER — Inpatient Hospital Stay: Payer: Medicare Other | Attending: Hematology & Oncology

## 2021-12-16 VITALS — BP 147/70 | HR 55 | Temp 98.0°F | Resp 18 | Wt 188.4 lb

## 2021-12-16 DIAGNOSIS — Z79899 Other long term (current) drug therapy: Secondary | ICD-10-CM | POA: Insufficient documentation

## 2021-12-16 DIAGNOSIS — I824Y1 Acute embolism and thrombosis of unspecified deep veins of right proximal lower extremity: Secondary | ICD-10-CM

## 2021-12-16 DIAGNOSIS — Z86718 Personal history of other venous thrombosis and embolism: Secondary | ICD-10-CM | POA: Diagnosis present

## 2021-12-16 DIAGNOSIS — R200 Anesthesia of skin: Secondary | ICD-10-CM | POA: Diagnosis not present

## 2021-12-16 DIAGNOSIS — Z7901 Long term (current) use of anticoagulants: Secondary | ICD-10-CM | POA: Diagnosis not present

## 2021-12-16 DIAGNOSIS — I2699 Other pulmonary embolism without acute cor pulmonale: Secondary | ICD-10-CM

## 2021-12-16 DIAGNOSIS — Z86711 Personal history of pulmonary embolism: Secondary | ICD-10-CM | POA: Diagnosis present

## 2021-12-16 LAB — CBC WITH DIFFERENTIAL (CANCER CENTER ONLY)
Abs Immature Granulocytes: 0.05 10*3/uL (ref 0.00–0.07)
Basophils Absolute: 0.1 10*3/uL (ref 0.0–0.1)
Basophils Relative: 1 %
Eosinophils Absolute: 0.1 10*3/uL (ref 0.0–0.5)
Eosinophils Relative: 1 %
HCT: 44.5 % (ref 39.0–52.0)
Hemoglobin: 14.5 g/dL (ref 13.0–17.0)
Immature Granulocytes: 1 %
Lymphocytes Relative: 26 %
Lymphs Abs: 2.8 10*3/uL (ref 0.7–4.0)
MCH: 28.6 pg (ref 26.0–34.0)
MCHC: 32.6 g/dL (ref 30.0–36.0)
MCV: 87.8 fL (ref 80.0–100.0)
Monocytes Absolute: 0.8 10*3/uL (ref 0.1–1.0)
Monocytes Relative: 7 %
Neutro Abs: 7.1 10*3/uL (ref 1.7–7.7)
Neutrophils Relative %: 64 %
Platelet Count: 205 10*3/uL (ref 150–400)
RBC: 5.07 MIL/uL (ref 4.22–5.81)
RDW: 14.3 % (ref 11.5–15.5)
WBC Count: 11 10*3/uL — ABNORMAL HIGH (ref 4.0–10.5)
nRBC: 0 % (ref 0.0–0.2)

## 2021-12-16 LAB — CMP (CANCER CENTER ONLY)
ALT: 17 U/L (ref 0–44)
AST: 18 U/L (ref 15–41)
Albumin: 4.3 g/dL (ref 3.5–5.0)
Alkaline Phosphatase: 73 U/L (ref 38–126)
Anion gap: 9 (ref 5–15)
BUN: 20 mg/dL (ref 8–23)
CO2: 27 mmol/L (ref 22–32)
Calcium: 9.8 mg/dL (ref 8.9–10.3)
Chloride: 107 mmol/L (ref 98–111)
Creatinine: 1.43 mg/dL — ABNORMAL HIGH (ref 0.61–1.24)
GFR, Estimated: 51 mL/min — ABNORMAL LOW (ref >60)
Glucose, Bld: 110 mg/dL — ABNORMAL HIGH (ref 70–99)
Potassium: 4.5 mmol/L (ref 3.5–5.1)
Sodium: 143 mmol/L (ref 135–145)
Total Bilirubin: 0.6 mg/dL (ref 0.3–1.2)
Total Protein: 6.9 g/dL (ref 6.5–8.1)

## 2021-12-16 NOTE — Progress Notes (Signed)
Hematology and Oncology Follow Up Visit  Brian Brock [id number] 09/13/1945 76 y.o. 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 He is doing fairly well. He notes fatigue and states that he is still having issues with IBS (constipation vs diarrhea) despite having seen GI.  He has not noted any blood loss. No abnormal bruising, no petechiae.  No fever, chills, n/v, cough, rash, dizziness, SOB, chest pain, palpitations or changes in bladder habits.  No swelling in his extremities at this time.  He has numbness in the left thumb due to chronic neck issues and is not ready to have surgery to correct at this time.  No falls or syncope.  Appetite and hydration are good. His weight is stable at 188 lbs.   ECOG Performance Status: 0 - Asymptomatic  Medications:  Allergies as of 12/16/2021       Reactions   Other Anaphylaxis, Shortness Of Breath, Swelling   [place] nuts- "will kill me"   Spironolactone Other (See Comments)   Mastodynia   Clarithromycin Nausea Only   Fenofibrate Micronized Other (See Comments)   Caused weakness/lethargy   Metoprolol Other (See Comments)   Fatigue and malaise   Tricor [fenofibrate] Other (See Comments)   Tired/weak   Fish Oil Rash   Levofloxacin Other (See Comments)   Doesn't remember   Meloxicam Other (See Comments)   Achy, fatigue   Metaxalone Other (See Comments)   Doesn't remember   Niaspan [niacin] Other (See Comments)   Doesn't remember   Sulfa Antibiotics Other (See Comments)   Other reaction(s): Unknown   Sulfadiazine Hives, Rash        Medication List        Accurate as of  December 16, 2021  1:00 PM. If you have any questions, ask your nurse or doctor.          acetaminophen 500 MG tablet Commonly known as: TYLENOL Take 500-1,000 mg by mouth every 6 (six) hours as needed for moderate pain.   albuterol (2.5 MG/3ML) 0.083% nebulizer solution Commonly known as: PROVENTIL Use 55m by nebulization every 6 hours and as needed and for wheezing or shortness of breath.   amLODipine 5 MG tablet Commonly known as: NORVASC TAKE 1 TABLET ONCE DAILY What changed:  when to take this reasons to take this   cetirizine 10 MG tablet Commonly known as: ZYRTEC Take 10 mg by mouth daily.   cyclobenzaprine 10 MG tablet Commonly known as: FLEXERIL Take 10 mg by mouth 3 (three) times daily as needed for muscle spasms.   fluconazole 100 MG tablet Commonly known as: Diflucan Take 1 tablet (100 mg total) by mouth daily.   levothyroxine 25 MCG tablet Commonly known as: SYNTHROID Take 25 mcg by mouth daily before breakfast.   loperamide 2 MG tablet Commonly known as: IMODIUM A-D Take 2 mg by mouth 4 (four) times daily as needed for diarrhea or loose stools.   Melatonin 12 MG Tabs Take 12 mg by mouth at bedtime.   nitroGLYCERIN 0.4 MG SL tablet Commonly known as: NITROSTAT Place 1 tablet (0.4 mg total) under the tongue  every 5 (five) minutes as needed for chest pain.   OXYGEN Inhale 2 L into the lungs at bedtime.   pantoprazole 40 MG tablet Commonly known as: PROTONIX Take 1 tablet (40 mg total) by mouth 2 (two) times daily. What changed: when to take this   PROBIOTIC PO Take 1 capsule by mouth daily.   rivaroxaban 20 MG Tabs tablet Commonly known as: XARELTO Take 1 tablet (20 mg total) by mouth daily with supper.   rosuvastatin 20 MG tablet Commonly known as: CRESTOR TAKE ONE TABLET BY MOUTH EVERY DAY What changed: how much to take   vitamin C 1000 MG tablet Take 1,000 mg by mouth in the morning and at bedtime.   Vitamin D3 50 MCG (2000 UT)  Tabs Take 2,000 Units by mouth daily.   zolpidem 10 MG tablet Commonly known as: AMBIEN Take 10 mg by mouth at bedtime as needed for sleep.        Allergies:  Allergies  Allergen Reactions   Other Anaphylaxis, Shortness Of Breath and Swelling    [place] nuts- "will kill me"   Spironolactone Other (See Comments)    Mastodynia   Clarithromycin Nausea Only   Fenofibrate Micronized Other (See Comments)    Caused weakness/lethargy   Metoprolol Other (See Comments)    Fatigue and malaise   Tricor [Fenofibrate] Other (See Comments)    Tired/weak   Fish Oil Rash   Levofloxacin Other (See Comments)    Doesn't remember   Meloxicam Other (See Comments)    Achy, fatigue   Metaxalone Other (See Comments)    Doesn't remember   Niaspan [Niacin] Other (See Comments)    Doesn't remember   Sulfa Antibiotics Other (See Comments)    Other reaction(s): Unknown   Sulfadiazine Hives and Rash    Past Medical History, Surgical history, Social history, and Family History were reviewed and updated.  Review of Systems: All other 10 point review of systems is negative.   Physical Exam:  vitals were not taken for this visit.   Wt Readings from Last 3 Encounters:  11/05/21 190 lb (86.2 kg)  08/06/21 194 lb 9.6 oz (88.3 kg)  07/02/21 194 lb (88 kg)    Ocular: Sclerae unicteric, pupils equal, round and reactive to light Ear-nose-throat: Oropharynx clear, dentition fair Lymphatic: No cervical or supraclavicular adenopathy Lungs no rales or rhonchi, good excursion bilaterally Heart regular rate and rhythm, no murmur appreciated Abd soft, nontender, positive bowel sounds MSK no focal spinal tenderness, no joint edema Neuro: non-focal, well-oriented, appropriate affect Breasts: Deferred   Lab Results  Component Value Date   WBC 9.9 06/18/2021   HGB 14.4 06/18/2021   HCT 44.0 06/18/2021   MCV 86.8 06/18/2021   PLT 232 06/18/2021   Lab Results  Component Value Date   FERRITIN 135.1  11/03/2017   IRON 102 11/03/2017   IRONPCTSAT 27.5 11/03/2017   Lab Results  Component Value Date   RBC 5.07 06/18/2021   No results found for: "KPAFRELGTCHN", "LAMBDASER", "KAPLAMBRATIO" No results found for: "IGGSERUM", "IGA", "IGMSERUM" No results found for: "TOTALPROTELP", "ALBUMINELP", "A1GS", "A2GS", "BETS", "BETA2SER", "GAMS", "MSPIKE", "SPEI"   Chemistry      Component Value Date/Time   NA 142 08/04/2021 1113   K 4.0 08/04/2021 1113   CL 106 08/04/2021 1113   CO2 23 08/04/2021 1113   BUN 21 08/04/2021 1113   CREATININE 1.20 08/04/2021 1113   CREATININE 1.38 (H) 06/18/2021 1008   CREATININE 1.05 02/12/2016 1008  Component Value Date/Time   CALCIUM 8.6 08/04/2021 1113   ALKPHOS 78 06/18/2021 1008   AST 18 06/18/2021 1008   ALT 15 06/18/2021 1008   BILITOT 0.6 06/18/2021 1008       Impression and Plan: Brian Brock is a very pleasant 76 yo caucasian gentleman with history of unprovoked RLE DVT and bilateral PTE on lifelong anticoagulation with Xarelto.   He will continue his same regimen. No changes.  Follow-up in 6 months.   Lottie Dawson, NP 8/1/20231:00 PM

## 2022-01-15 ENCOUNTER — Other Ambulatory Visit: Payer: Self-pay

## 2022-01-15 DIAGNOSIS — I25118 Atherosclerotic heart disease of native coronary artery with other forms of angina pectoris: Secondary | ICD-10-CM

## 2022-02-17 ENCOUNTER — Ambulatory Visit: Payer: Medicare Other

## 2022-02-17 DIAGNOSIS — I6523 Occlusion and stenosis of bilateral carotid arteries: Secondary | ICD-10-CM

## 2022-02-23 NOTE — Progress Notes (Signed)
Carotid artery duplex 02/17/2022: No hemodynamically significant arterial disease in the internal carotid artery bilaterally. Antegrade left vertebral artery flow. Compared to the study done on 01/07/2021, left ICA stenosis of 16 to 49%.  No longer present.  There is very mild plaque noted in the bilateral carotid arteries.

## 2022-04-10 NOTE — Progress Notes (Signed)
HPI  M former Smoker (54 pk yrs) followed for  for asthma/ bronchitis complicated by Tobacco use, CAD/ CABG,  PAD/ Claudication, GERD, PE 05/24/2018, Nocturnal Hypoxemia, PAFib, GERD, Covid infection 9/ 2021,  PFT 09/08/12   ? Not done NPSG 12/27/19- [facility]/  No OSA but desat to 67%, desat time 45 minutes dx Nocturnal Hypoxemia) (Had Sleep Study- [facility]/  No OSA but desat to 67%, desat time 45 minutes dx Nocturnal Hypoxemia)  --------------------------------------------------------------------------------------   06/06/21- 76 year old male former smoker (28 pack years) followed for asthma/bronchitis, Nocturnal Hypoxemia, complicated by ASCVD/ CAD/CABG, PAD, PAFib, GERD, PE/ DVT 05/2018/ Xarelto, IBS,   Unprovoked RLE DVT and bilateral PE 05/24/2018./ Xarelto,  Managed by [facility], Covid infection 9//2021 , Home O2 2L/ [employer]-    Started by Dr Einar Gip -Albuterol HFA, Neb albuterol,  Covid vax-no Flu vax-had At Oct OV we sent levaquin for pneumonia. Needs f/u CXR.  Also sent Trelegy. He feels back to baseline now with no cough.  We reviewed PFT which shows very minimal obstruction without response to bronchodilator.  Previous trials with bronchodilators had not made any difference.  He continues to sleep with oxygen. PFT 05/30/21- minimal obstruction w/o resp to BD. Normal DLCO. FEV1/FVC 0.76  04/13/22- 75 year old male former smoker (28 pack years) followed for asthma/bronchitis, Nocturnal Hypoxemia, complicated by ASCVD/ CAD/CABG, PAD, PAFib, GERD, PE/ DVT 05/2018/ Xarelto, IBS,   Unprovoked RLE DVT and bilateral PE 05/24/2018./ Xarelto,  Managed by [facility], Covid infection 9//2021 , Home O2 2L/ [employer]-    Started by Dr Einar Gip -Albuterol HFA, Neb albuterol, Ambien 10 Covid vax-no Flu vax- today senior --------Pt is having some congestion, has been treated previously by pcp over the last month, States the congestion is not completely going away. He has been coughing more over the past  month with waxing and waning yellow/green sputum but probably no definite fever.  He had been treated with doxycycline, Kenalog injection, prednisone, azithromycin, Levaquin.  Then started on budesonide nebulizer solution for 8 days which improved him.  After that ran out, over the past week cough has been coming back again and again becoming discolored.  Some maxillary fullness if he leans over.  Not clear if he is having any upper airway discharge.  He reports chest x-ray was negative for acute process. CXR 06/06/21- IMPRESSION: No significant change from prior. Chronic hyperinflation. No acute lung process.    ROS-see HPI + = positive Constitutional:   No-   weight loss, night sweats, fevers, chills, fatigue, lassitude. HEENT:   No-  headaches, difficulty swallowing, tooth/dental problems, sore throat,       No-  sneezing, itching, ear ache, nasal congestion, post nasal drip,  CV:  No-   chest pain, orthopnea, PND, swelling in lower extremities, anasarca, dizziness, palpitations Resp: +-shortness of breath with exertion or at rest.                 productive cough,   non-productive cough,  No- coughing up of blood.              No-   change in color of mucus.  No- wheezing.   Skin: No-   rash or lesions. GI:  No-   heartburn, indigestion, abdominal pain, nausea, vomiting,  GU:  MS:  No-   joint pain or swelling.  . Neuro-     nothing unusual Psych:  No- change in mood or affect. No depression or anxiety.  No memory loss.  OBJ-  Physical Exam General- Alert, Oriented, Affect-appropriate, Distress- none acute Skin- rash-none, lesions- none, excoriation- none Lymphadenopathy- none Head- atraumatic            Eyes- Gross vision intact, PERRLA, conjunctivae and secretions clear            Ears- Hearing, canals-normal            Nose- Clear, no-Septal dev, mucus, polyps, erosion, perforation             Throat- Mallampati II , mucosa+cobblestoned, drainage- none, tonsils- atrophic,  dentures Neck- flexible , trachea midline, no stridor , thyroid nl, carotid no bruit Chest - symmetrical excursion , unlabored           Heart/CV- RRR occasional extra beat , no murmur , no gallop  , no rub, nl s1 s2                           - JVD- none , edema- none, stasis changes- none, varices- none           Lung-  wheeze+ , +distant, cough- none , dullness-none, rub- none           Chest wall-  Abd-  Br/ Gen/ Rectal- Not done, not indicated Extrem- cyanosis- none, clubbing, none, atrophy- none, strength- nl Neuro- grossly intact to observation

## 2022-04-13 ENCOUNTER — Encounter: Payer: Self-pay | Admitting: Internal Medicine

## 2022-04-13 ENCOUNTER — Ambulatory Visit (INDEPENDENT_AMBULATORY_CARE_PROVIDER_SITE_OTHER): Payer: Medicare Other | Admitting: Internal Medicine

## 2022-04-13 VITALS — BP 130/68 | HR 69 | Ht 69.0 in | Wt 189.2 lb

## 2022-04-13 DIAGNOSIS — J302 Other seasonal allergic rhinitis: Secondary | ICD-10-CM | POA: Diagnosis not present

## 2022-04-13 DIAGNOSIS — J3089 Other allergic rhinitis: Secondary | ICD-10-CM | POA: Diagnosis not present

## 2022-04-13 DIAGNOSIS — Z23 Encounter for immunization: Secondary | ICD-10-CM

## 2022-04-13 DIAGNOSIS — I2510 Atherosclerotic heart disease of native coronary artery without angina pectoris: Secondary | ICD-10-CM

## 2022-04-13 DIAGNOSIS — J449 Chronic obstructive pulmonary disease, unspecified: Secondary | ICD-10-CM | POA: Diagnosis not present

## 2022-04-13 MED ORDER — AMOXICILLIN 500 MG PO TABS
500.0000 mg | ORAL_TABLET | Freq: Two times a day (BID) | ORAL | 0 refills | Status: DC
Start: 2022-04-13 — End: 2022-05-21

## 2022-04-13 MED ORDER — BUDESONIDE 0.5 MG/2ML IN SUSP: RESPIRATORY_TRACT | 12 refills | Status: AC

## 2022-04-13 NOTE — Assessment & Plan Note (Signed)
Wheezing now suggest this is primarily an exacerbation of COPD with bronchitis.  He says sputum is turning discolored again so I will try amoxicillin.  He is very sensitive to medication costs.  He did seem to respond to budesonide nebulizer solution so we will extend that

## 2022-04-13 NOTE — Patient Instructions (Signed)
Order- flu vax senior  Scrips sent for amoxicillin and for more of the budesonide nebulizer solution  You can add the budesonide to your albuterol nebulizer solution and do them both at once if you want to try that.

## 2022-04-13 NOTE — Assessment & Plan Note (Signed)
If he has any bacterial sinusitis his medications over the past month should clear it.  We will watch this.

## 2022-04-15 ENCOUNTER — Telehealth: Payer: Self-pay

## 2022-04-15 ENCOUNTER — Other Ambulatory Visit (HOSPITAL_COMMUNITY): Payer: Self-pay

## 2022-04-15 NOTE — Telephone Encounter (Signed)
PA request received via [facility] for Budesonide 0.'5MG'$ /2ML suspension through [place].  PA not submitted due to test claim showing medication must be billed through Part B.  Key: [id number]

## 2022-04-18 ENCOUNTER — Encounter: Payer: Self-pay | Admitting: Internal Medicine

## 2022-04-20 NOTE — Telephone Encounter (Signed)
I can't tell right now whether itching is a reaction to amoxicillin or due to something else, revealed by stopping antihistamine. If he was itching before amoxicillin, then ok to finish the course and take antihistamine until amoxicillin is done. Otherwise, safest to stop amoxicillin now and watch to see if we need to find another antibiotic.

## 2022-05-06 ENCOUNTER — Ambulatory Visit: Payer: Medicare Other | Admitting: Cardiology

## 2022-05-13 ENCOUNTER — Ambulatory Visit: Payer: Medicare Other | Admitting: Cardiology

## 2022-05-21 ENCOUNTER — Encounter: Payer: Self-pay | Admitting: Cardiology

## 2022-05-21 ENCOUNTER — Ambulatory Visit: Payer: Medicare Other | Admitting: Cardiology

## 2022-05-21 VITALS — BP 135/75 | HR 59 | Ht 69.0 in | Wt 192.0 lb

## 2022-05-21 DIAGNOSIS — F411 Generalized anxiety disorder: Secondary | ICD-10-CM

## 2022-05-21 DIAGNOSIS — I739 Peripheral vascular disease, unspecified: Secondary | ICD-10-CM

## 2022-05-21 DIAGNOSIS — I25118 Atherosclerotic heart disease of native coronary artery with other forms of angina pectoris: Secondary | ICD-10-CM

## 2022-05-21 DIAGNOSIS — E7800 Pure hypercholesterolemia, unspecified: Secondary | ICD-10-CM

## 2022-05-21 DIAGNOSIS — I10 Essential (primary) hypertension: Secondary | ICD-10-CM

## 2022-05-21 MED ORDER — PROPRANOLOL HCL 10 MG PO TABS: 10.0000 mg | ORAL_TABLET | Freq: Two times a day (BID) | ORAL | 2 refills | Status: DC

## 2022-05-21 NOTE — Progress Notes (Signed)
Primary Physician/Referring:  [name], MD  Patient ID: Brian Brock, male    DOB: [birth year], 77 y.o.   MRN: [id number]  Chief Complaint  Patient presents with   Coronary artery disease of native artery of native heart wi   Hypertension   Follow-up    6 month   HPI:    Brian Brock  is a 77 y.o. Caucasian male with CAD with multiple angioplasties over the years, due to restenosis underwent CABG on 09/12/2012 but had recurrence of angina and LIMA occluded needing LAD stenting in 2016.  Past medical history significant for peripheral artery disease with left iliac artery and left SFA stenting in 2014 and drug-coated balloon angioplasty followed by DCB on 09/05/2019 for left common femoral artery.   Past medical history also significant for hypertension, hyperglycemia, hyperlipidemia, hypothyroidism, spontaneous DVT and pulmonary embolism in January 2020 and has been on Xarelto since then.  He has chronic dyspnea on exertion and COPD with quit tobacco use  in Apr 2022, nocturnal hypoxemia on home O2.    Patient is here for a 77 month follow-up visit, states that he is feeling the best he has in quite a while. Dyspnea has remained stable.  Past Medical History:  Diagnosis Date   AF (atrial fibrillation) (HCC)    happened after CABG, has not had it since   Allergic rhinitis    Allergy    Anginal pain ([place]) 1997   Arthritis    "lower back" (09/26/2015)   Childhood asthma    Chronic bronchitis (HCC)    Yearly   COPD (chronic obstructive pulmonary disease) (HCC)    Coronary artery disease    Multiple stents   Dyslipidemia    Dysrhythmia    Femoral hernia of right side 12/28/2016   GERD (gastroesophageal reflux disease)    History of hiatal hernia    Hyperlipemia    Hypertension    Hypothyroidism    Irritable bowel syndrome    Myocardial infarct ([place]) 1997   "mild"   PE (pulmonary thromboembolism) ([place]) 05/2018   Peripheral vascular disease (HCC)    prior  stenting   Pre-diabetes    Restless legs     Social History   Tobacco Use   Smoking status: Former    Packs/day: 0.50    Years: 56.00    Total pack years: 28.00    Types: Cigarettes    Quit date: 08/23/2020    Years since quitting: 1.7   Smokeless tobacco: Former    Types: Chew    Quit date: 05/21/2021  Substance Use Topics   Alcohol use: Yes    Alcohol/week: 1.0 standard drink of alcohol    Types: 1 Standard drinks or equivalent per week    Comment: 09/26/2015 "varies; might have a few drinks q 2 months"   Marital status: Married   ROS   Review of Systems  Cardiovascular:  Positive for dyspnea on exertion. Negative for chest pain, claudication and leg swelling.  Gastrointestinal:  Negative for melena.   Objective  Blood pressure 135/75, pulse (!) 59, height '5\' 9"'$  (1.753 m), weight 192 lb (87.1 kg), SpO2 95 %.     05/21/2022    1:24 PM 04/13/2022    1:28 PM 12/16/2021    1:12 PM  Vitals with BMI  Height '5\' 9"'$  '5\' 9"'$    Weight 192 lbs 189 lbs 3 oz 188 lbs 6 oz  BMI 40.34 74.25   Systolic 956 387 564  Diastolic  75 68 70  Pulse 59 69 55     Physical Exam Constitutional:      Appearance: He is well-developed.  Neck:     Vascular: Carotid bruit (bilateral) present. No JVD.  Cardiovascular:     Rate and Rhythm: Regular rhythm. Bradycardia present.     Pulses:          Carotid pulses are 2+ on the right side with bruit and 2+ on the left side.      Femoral pulses are 2+ on the right side with bruit and 2+ on the left side with bruit.      Popliteal pulses are 1+ on the right side and 2+ on the left side.       Dorsalis pedis pulses are 1+ on the right side and 1+ on the left side.       Posterior tibial pulses are 0 on the right side and 2+ on the left side.     Heart sounds: Normal heart sounds.  Pulmonary:     Effort: Pulmonary effort is normal.     Breath sounds: Rales (bilateral diffuse) present.  Abdominal:     General: Bowel sounds are normal.     Palpations:  Abdomen is soft.  Musculoskeletal:     Right lower leg: Edema (Trace ankle) present.     Left lower leg: Edema (Trace ankle only) present.    Laboratory examination:   Recent Labs    06/18/21 1008 06/30/21 0856 08/04/21 1113 12/16/21 1251  NA 143 139 142 143  K 3.9 4.5 4.0 4.5  CL 105 102 106 107  CO2 '29 22 23 27  '$ GLUCOSE 107* 137* 119* 110*  BUN '19 21 21 20  '$ CREATININE 1.38* 1.50* 1.20 1.43*  CALCIUM 9.9 9.2 8.6 9.8  GFRNONAA 53*  --   --  51*   CrCl cannot be calculated (Patient's most recent lab result is older than the maximum 21 days allowed.).     Latest Ref Rng & Units 12/16/2021   12:51 PM 08/04/2021   11:13 AM 06/30/2021    8:56 AM  CMP  Glucose 70 - 99 mg/dL 110  119  137   BUN 8 - 23 mg/dL '20  21  21   '$ Creatinine 0.61 - 1.24 mg/dL 1.43  1.20  1.50   Sodium 135 - 145 mmol/L 143  142  139   Potassium 3.5 - 5.1 mmol/L 4.5  4.0  4.5   Chloride 98 - 111 mmol/L 107  106  102   CO2 22 - 32 mmol/L '27  23  22   '$ Calcium 8.9 - 10.3 mg/dL 9.8  8.6  9.2   Total Protein 6.5 - 8.1 g/dL 6.9     Total Bilirubin 0.3 - 1.2 mg/dL 0.6     Alkaline Phos 38 - 126 U/L 73     AST 15 - 41 U/L 18     ALT 0 - 44 U/L 17         Latest Ref Rng & Units 12/16/2021   12:51 PM 06/18/2021   10:08 AM 02/24/2021    8:35 AM  CBC  WBC 4.0 - 10.5 K/uL 11.0  9.9  8.7   Hemoglobin 13.0 - 17.0 g/dL 14.5  14.4  13.5   Hematocrit 39.0 - 52.0 % 44.5  44.0  41.3   Platelets 150 - 400 K/uL 205  232  175.0    Last lipids Lab Results  Component Value Date  CHOL 123 01/02/2021   HDL 36 (L) 01/02/2021   LDLCALC 67 01/02/2021   TRIG 106 01/02/2021   CHOLHDL 2.9 02/07/2018   HEMOGLOBIN A1C Lab Results  Component Value Date   HGBA1C 6.3 (H) 02/14/2020   MPG 134.11 02/14/2020   TSH No results for input(s): "TSH" in the last 8760 hours.  Allergies   Allergies  Allergen Reactions   Other Anaphylaxis, Shortness Of Breath and Swelling    [place] nuts- "will kill me"   Spironolactone Other (See  Comments)    Mastodynia   Clarithromycin Nausea Only   Fenofibrate Micronized Other (See Comments)    Caused weakness/lethargy   Metoprolol Other (See Comments)    Fatigue and malaise   Sulfur Hexafluoride Hives   Tricor [Fenofibrate] Other (See Comments)    Tired/weak   Fish Oil Rash   Meloxicam Other (See Comments)    Achy, fatigue   Metaxalone Other (See Comments)    Doesn't remember   Niaspan [Niacin] Other (See Comments)    Doesn't remember   Sulfa Antibiotics Other (See Comments)    Other reaction(s): Unknown   Sulfadiazine Hives and Rash   Final Medications at End of Visit     Current Outpatient Medications:    acetaminophen (TYLENOL) 500 MG tablet, Take 500-1,000 mg by mouth every 6 (six) hours as needed for moderate pain. , Disp: , Rfl:    albuterol (PROVENTIL) (2.5 MG/3ML) 0.083% nebulizer solution, Use 24m by nebulization every 6 hours and as needed and for wheezing or shortness of breath., Disp: 360 mL, Rfl: 6   amLODipine (NORVASC) 5 MG tablet, TAKE 1 TABLET ONCE DAILY (Patient taking differently: Take 5 mg by mouth daily as needed.), Disp: 30 tablet, Rfl: 2   Ascorbic Acid (VITAMIN C) 1000 MG tablet, Take 1,000 mg by mouth in the morning and at bedtime. , Disp: , Rfl:    budesonide (PULMICORT) 0.5 MG/2ML nebulizer solution, 1 neb twice daily- rinse mouth, Disp: 60 mL, Rfl: 12   cetirizine (ZYRTEC) 10 MG tablet, Take 10 mg by mouth daily., Disp: , Rfl:    Cholecalciferol (VITAMIN D3) 50 MCG (2000 UT) TABS, Take 2,000 Units by mouth daily., Disp: , Rfl:    cyclobenzaprine (FLEXERIL) 10 MG tablet, Take 10 mg by mouth 3 (three) times daily as needed for muscle spasms., Disp: , Rfl:    fluconazole (DIFLUCAN) 100 MG tablet, Take 1 tablet (100 mg total) by mouth daily., Disp: 7 tablet, Rfl: 0   levothyroxine (SYNTHROID, LEVOTHROID) 25 MCG tablet, Take 25 mcg by mouth daily before breakfast. , Disp: , Rfl:    loperamide (IMODIUM A-D) 2 MG tablet, Take 2 mg by mouth 4 (four)  times daily as needed for diarrhea or loose stools., Disp: , Rfl:    Melatonin 12 MG TABS, Take 12 mg by mouth at bedtime. , Disp: , Rfl:    nitroGLYCERIN (NITROSTAT) 0.4 MG SL tablet, Place 1 tablet (0.4 mg total) under the tongue every 5 (five) minutes as needed for chest pain., Disp: 25 tablet, Rfl: 11   OXYGEN, Inhale 2 L into the lungs at bedtime., Disp: , Rfl:    pantoprazole (PROTONIX) 40 MG tablet, Take 1 tablet (40 mg total) by mouth 2 (two) times daily. (Patient taking differently: Take 40 mg by mouth at bedtime.), Disp: 60 tablet, Rfl: 0   propranolol (INDERAL) 10 MG tablet, Take 1 tablet (10 mg total) by mouth 2 (two) times daily., Disp: 60 tablet, Rfl: 2   rivaroxaban (  XARELTO) 20 MG TABS tablet, Take 1 tablet (20 mg total) by mouth daily with supper., Disp: 90 tablet, Rfl: 3   rosuvastatin (CRESTOR) 20 MG tablet, TAKE ONE TABLET BY MOUTH EVERY DAY (Patient taking differently: Take 10 mg by mouth daily.), Disp: 90 tablet, Rfl: 3   zolpidem (AMBIEN) 10 MG tablet, Take 10 mg by mouth at bedtime as needed for sleep. , Disp: , Rfl:   Radiology:   CT angio chest 12/16/2020: Coronary atherosclerosis SP CABG. Mediastinum normal. Lungs show airway thickening bilaterally with intermittent narrowing.  Mucoid impaction in the right lower lobe where there is streaky opacity and volume loss.  Otherwise no significant abnormality.  CT angiogram of the abdomen 02/27/2021: 1. Atherosclerosis throughout the abdomen and pelvis. Aortic Atherosclerosis (ICD10-I70.0). 2. Left iliac artery stents are patent. Occlusive disease involving the origin and proximal aspect of the internal iliac arteries bilaterally. 3. Mixed plaque involving the origin of the celiac trunk with approximately 50% narrowing. SMA and IMA are patent.   NON-VASCULAR   1. Increased consolidation and bronchiectasis in the medial right lower lobe and new patchy peripheral densities in left lower lobe. Findings are concerning for  areas of pneumonia or aspiration.  Cardiac Studies:   Coronary angiogram and stenting to his LAD and RCA  in 1997, 1998, and in 2004 to his proximal left circumflex coronary artery.  Due to recurrent restenosis underwent CABG on 09/12/2012 with off-pump LIMA to LAD and SVG to OM1.  CABG  by Dr. Roxan Hockey on 09/12/2012 with an off-pump LIMA to LAD (Occluded by angoigram in 2016) and SVG to circumflex marginal 1 vessel.  Coronary angiogram 07/24/2014: High-grade stenosis to the proximal LAD, patent circumflex stent, patent SVG to OM1 however LIMA to LAD occluded.  Successful stenting to proximal LAD with 3.0 x 34 mm resolute DES, postdilated to 3.4 mm.  Echocardiogram 08/07/2019:  Normal LV systolic function with visual EF 50-55%. Left ventricle cavity is normal in size. Normal global wall motion. No obvious regional wall  motion abnormalities. Mild concentric hypertrophy of the left ventricle. Indeterminate diastolic filling pattern, normal LAP. Calculated EF 50%.  Left atrial cavity is severely dilated.  Visually, right atrial cavity is mildly dilated.  Mild mitral regurgitation.  Moderate tricuspid regurgitation. No evidence of pulmonary hypertension. RVSP measures 33 mmHg.  Mild pulmonic regurgitation.  IVC is dilated with a respiratory response of >50%.  No significant change compared to previous study 05/25/2018.   Lexiscan (Walking with mod Bruce)Tetrofosmin Stress Test  08/07/2019: Nondiagnostic ECG stress. Perfusion imaging study is abnormal.  Mild inferior, inferolateral small sized defect consistent with ischemia. Left ventricular systolic function is mildly depressed, LVEF calculated at 49% with inferior hypokinesis. Compared to 10/22/2016 report- normal study.  Intermediate risk study.   Peripheral arteriogram 09/05/19: Abdominal aortogram revealed presence of 2 renal arteries 1 on either sides, widely patent.  Mild atherosclerotic changes abdominal aorta noted.  Bilateral  aortoiliac bifurcation was widely patent with mild calcification. Left external iliac artery stent placed 2017 and right CFA patch angioplasty 2017 patent.  There was diffuse moderate calcification noted throughout the entire iliac artery system.  Bilateral internal iliac arteries are occluded.  There is a old left external iliac artery stent which has a 40 to 50% in-stent restenosis focally without pressure gradient.  Left common iliac artery has a 80% stenosis with a 20 to 30 mm pressure gradient. Left common femoral artery is very long and profunda bifurcation happens distal than normal, the left common femoral  artery has a calcific 80 to 90% stenosis, brisk flow in the left SFA and old left SFA stent (2004) is widely patent.  Below the left knee there is two-vessel runoff with occluded left PT.   Right common femoral artery patch angioplasty site is widely patent.  Right SFA shows mild diffuse disease with moderate amount of calcification.  Previously placed right mid SFA stent (2008) is widely patent.  There is three-vessel runoff below the right knee.  Intervention data: 1. Left common femoral artery: PTA with Shockwave lithotripsy with a 7.0 x 60 mm shockwave balloon followed by drug-coated balloon angioplasty with a 8 mm x 40 mm Lutonix angioplasty, stenosis reduced from 85% to less than 10% with brisk flow. 2. PTA and self-expanding 10.0 x 40 mm absolute Pro stent to the left common iliac artery followed by post angioplasty with a 9.0 x 40 mm [place]ang at 8 atmospheric pressure for 60 seconds, stenosis reduced from 80% to 0%.  There was also left external iliac artery old stent that had a 50% stenosis was also stented with the same self-expanding stent overlapped followed by balloon angioplasty.  Overall no residual stenosis.  ABI 09/26/2019:  This exam reveals mildly decreased perfusion of the right lower extremity, noted at the anterior tibial artery level (ABI 0.83) and mildly decreased   perfusion of the left lower extremity, noted at the anterior tibial artery level (ABI 0.86). Compared to 08/31/2019, bilateral ABI 0.81. (H/O Left CIA stent and left CFA angioplasty).  Carotid artery duplex 02/17/2022: No hemodynamically significant arterial disease in the internal carotid artery bilaterally. Antegrade left vertebral artery flow. Compared to the study done on 01/07/2021, left ICA stenosis of 16 to 49%.  No longer present.  There is very mild plaque noted in the bilateral carotid arteries.  EKG   EKG 05/21/2022: Sinus bradycardia at a rate of 54 bpm, otherwise normal EKG.  Compared to 05/29/2021, no significant change.  Assessment     ICD-10-CM   1. Coronary artery disease of native artery of native heart with stable angina pectoris ([place])  I25.118 EKG 12-Lead    2. Peripheral artery disease  I73.9     3. Primary hypertension  I10     4. Hypercholesteremia  E78.00     5. Generalized anxiety disorder  F41.1 propranolol (INDERAL) 10 MG tablet      Meds ordered this encounter  Medications   propranolol (INDERAL) 10 MG tablet    Sig: Take 1 tablet (10 mg total) by mouth 2 (two) times daily.    Dispense:  60 tablet    Refill:  2   Medications Discontinued During This Encounter  Medication Reason   amoxicillin (AMOXIL) 500 MG tablet Completed Course   Probiotic Product (PROBIOTIC PO) Patient Preference    Orders Placed This Encounter  Procedures   EKG 12-Lead    Recommendations:   JAZEN SPRAGGINS  is a 77 y.o. Caucasian male with CAD with multiple angioplasties over the years, due to restenosis underwent CABG on 09/12/2012 but had recurrence of angina and LIMA occluded needing LAD stenting in 2016.  Past medical history significant for peripheral artery disease with left iliac artery and left SFA stenting in 2014 and drug-coated balloon angioplasty followed by DCB on 09/05/2019 for left common femoral artery.   Past medical history also significant for  hypertension, hyperglycemia, hyperlipidemia, hypothyroidism, spontaneous DVT and pulmonary embolism in January 2020 and has been on Xarelto since then.  He has chronic dyspnea on exertion  and COPD with quit tobacco use  in Apr 2022, nocturnal hypoxemia on home O2.  1. Coronary artery disease of native artery of native heart with stable angina pectoris [facility]) He has not had any recurrence of angina pectoris, doing well, continue present medical therapy.  2. Peripheral artery disease Symptoms of claudication have essentially resolved although his vascular examination is abnormal.  He is only mild decrease in ABI previously, as long as he remains asymptomatic, continue observation.  He has quit smoking and has remained abstinent.    3. Primary hypertension Blood pressure is well-controlled on present medical regimen, no changes were done today.  4. Hypercholesteremia Lipids are controlled however he needs lipid profile testing, I will try to obtain the labs performed by his PCP.  He complains of generalized fatigue, checking vitamin D levels and also testosterone level may be an option.  No contraindication for testosterone supplementation if deficient.  5. Generalized anxiety disorder Recently has developed significant generalized anxiety disorder with tremors, anxious feeling while driving.  I would like to try low-dose of a beta-blocker, previously he could not tolerate metoprolol due to marked fatigue.  I started him on 10 mg twice daily of propranolol and he can certainly discontinue this if he does not tolerate this.  Otherwise stable from cardiovascular standpoint, I will see him back in 6 months for follow-up.    Adrian Prows, MD, [facility] 05/21/2022, 1:48 PM Office: [phone] Fax: [phone] Pager: [phone]

## 2022-05-27 ENCOUNTER — Encounter: Payer: Self-pay | Admitting: Cardiology

## 2022-05-27 NOTE — Telephone Encounter (Signed)
From patient.

## 2022-06-17 ENCOUNTER — Inpatient Hospital Stay: Payer: Medicare Other | Attending: Family

## 2022-06-17 ENCOUNTER — Encounter: Payer: Self-pay | Admitting: Family

## 2022-06-17 ENCOUNTER — Inpatient Hospital Stay (HOSPITAL_BASED_OUTPATIENT_CLINIC_OR_DEPARTMENT_OTHER): Payer: Medicare Other | Admitting: Family

## 2022-06-17 VITALS — BP 156/60 | HR 56 | Temp 98.1°F | Resp 20 | Ht 69.0 in | Wt 192.0 lb

## 2022-06-17 DIAGNOSIS — Z7901 Long term (current) use of anticoagulants: Secondary | ICD-10-CM | POA: Diagnosis not present

## 2022-06-17 DIAGNOSIS — I824Y1 Acute embolism and thrombosis of unspecified deep veins of right proximal lower extremity: Secondary | ICD-10-CM

## 2022-06-17 DIAGNOSIS — I2699 Other pulmonary embolism without acute cor pulmonale: Secondary | ICD-10-CM | POA: Diagnosis not present

## 2022-06-17 DIAGNOSIS — Z86711 Personal history of pulmonary embolism: Secondary | ICD-10-CM | POA: Diagnosis not present

## 2022-06-17 DIAGNOSIS — Z86718 Personal history of other venous thrombosis and embolism: Secondary | ICD-10-CM | POA: Insufficient documentation

## 2022-06-17 LAB — CMP (CANCER CENTER ONLY)
ALT: 16 U/L (ref 0–44)
AST: 18 U/L (ref 15–41)
Albumin: 4 g/dL (ref 3.5–5.0)
Alkaline Phosphatase: 65 U/L (ref 38–126)
Anion gap: 9 (ref 5–15)
BUN: 13 mg/dL (ref 8–23)
CO2: 28 mmol/L (ref 22–32)
Calcium: 9.4 mg/dL (ref 8.9–10.3)
Chloride: 105 mmol/L (ref 98–111)
Creatinine: 1.18 mg/dL (ref 0.61–1.24)
GFR, Estimated: >60 mL/min (ref >60)
Glucose, Bld: 161 mg/dL — ABNORMAL HIGH (ref 70–99)
Potassium: 3.9 mmol/L (ref 3.5–5.1)
Sodium: 142 mmol/L (ref 135–145)
Total Bilirubin: 0.8 mg/dL (ref 0.3–1.2)
Total Protein: 6.5 g/dL (ref 6.5–8.1)

## 2022-06-17 LAB — CBC WITH DIFFERENTIAL (CANCER CENTER ONLY)
Abs Immature Granulocytes: 0.02 10*3/uL (ref 0.00–0.07)
Basophils Absolute: 0.1 10*3/uL (ref 0.0–0.1)
Basophils Relative: 1 %
Eosinophils Absolute: 0.2 10*3/uL (ref 0.0–0.5)
Eosinophils Relative: 2 %
HCT: 40.4 % (ref 39.0–52.0)
Hemoglobin: 13.2 g/dL (ref 13.0–17.0)
Immature Granulocytes: 0 %
Lymphocytes Relative: 24 %
Lymphs Abs: 2.1 10*3/uL (ref 0.7–4.0)
MCH: 28.6 pg (ref 26.0–34.0)
MCHC: 32.7 g/dL (ref 30.0–36.0)
MCV: 87.6 fL (ref 80.0–100.0)
Monocytes Absolute: 0.8 10*3/uL (ref 0.1–1.0)
Monocytes Relative: 9 %
Neutro Abs: 5.6 10*3/uL (ref 1.7–7.7)
Neutrophils Relative %: 64 %
Platelet Count: 194 10*3/uL (ref 150–400)
RBC: 4.61 MIL/uL (ref 4.22–5.81)
RDW: 13.7 % (ref 11.5–15.5)
WBC Count: 8.7 10*3/uL (ref 4.0–10.5)
nRBC: 0 % (ref 0.0–0.2)

## 2022-06-17 NOTE — Progress Notes (Signed)
Hematology and Oncology Follow Up Visit  Brian Brock [id number] [birth year] 77 y.o. 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 He is doing quite well and has no complaints at this time.  He is taking his Xarelto daily as prescribed.  No blood loss noted. No bruising or petechiae.  No fever, chills, n/v, cough, rash, dizziness, SOB, chest pain, palpitations, abdominal pain or changes in bowel or bladder habits.  He takes imodium daily to prevent diarrhea.  No swelling in his extremities at this time. He gets tenderness and tingling in the right great toe off and on. Drinking cherry juice seems to help prevent this.  No falls or syncope reported.  Appetite and hydration are good. Weight is stable at 192 lbs.   ECOG Performance Status: 1 - Symptomatic but completely ambulatory  Medications:  Allergies as of 06/17/2022       Reactions   Other Anaphylaxis, Shortness Of Breath, Swelling   [place] nuts- "will kill me"   Spironolactone Other (See Comments)   Mastodynia   Clarithromycin Nausea Only   Fenofibrate Micronized Other (See Comments)   Caused weakness/lethargy   Metoprolol Other (See Comments)   Fatigue and malaise   Sulfur Hexafluoride Hives   Tricor [fenofibrate] Other (See Comments)   Tired/weak   Fish Oil Rash   Meloxicam Other (See Comments)   Achy, fatigue   Metaxalone Other (See Comments)   Doesn't remember   Niaspan [niacin] Other (See Comments)   Doesn't remember   Sulfa Antibiotics Other (See Comments)   Other reaction(s): Unknown   Sulfadiazine Hives, Rash        Medication List         Accurate as of June 17, 2022  1:28 PM. If you have any questions, ask your nurse or doctor.          STOP taking these medications    fluconazole 100 MG tablet Commonly known as: Diflucan Stopped by: Lottie Dawson, NP       TAKE these medications    acetaminophen 500 MG tablet Commonly known as: TYLENOL Take 500-1,000 mg by mouth every 6 (six) hours as needed for moderate pain.   albuterol (2.5 MG/3ML) 0.083% nebulizer solution Commonly known as: PROVENTIL Use 69m by nebulization every 6 hours and as needed and for wheezing or shortness of breath.   amLODipine 5 MG tablet Commonly known as: NORVASC TAKE 1 TABLET ONCE DAILY What changed:  when to take this reasons to take this   budesonide 0.5 MG/2ML nebulizer solution Commonly known as: Pulmicort 1 neb twice daily- rinse mouth   cetirizine 10 MG tablet Commonly known as: ZYRTEC Take 10 mg by mouth daily.   cyclobenzaprine 10 MG tablet Commonly known as: FLEXERIL Take 10 mg by mouth 3 (three) times daily as needed for muscle spasms.   finasteride 5 MG tablet Commonly known as: PROSCAR Take 1 tablet by mouth daily.   levothyroxine 25 MCG tablet Commonly known as: SYNTHROID Take 25 mcg by mouth daily before breakfast.   loperamide 2 MG tablet Commonly known as: IMODIUM A-D Take 2 mg by mouth  4 (four) times daily as needed for diarrhea or loose stools.   Melatonin 12 MG Tabs Take 12 mg by mouth at bedtime.   nitroGLYCERIN 0.4 MG SL tablet Commonly known as: NITROSTAT Place 1 tablet (0.4 mg total) under the tongue every 5 (five) minutes as needed for chest pain.   OXYGEN Inhale 2 L into the lungs at bedtime.   pantoprazole 40 MG tablet Commonly known as: PROTONIX Take 1 tablet (40 mg total) by mouth 2 (two) times daily. What changed: when to take this   propranolol 10 MG tablet Commonly known as: INDERAL Take 1 tablet (10 mg total) by mouth 2 (two) times daily.   rivaroxaban 20 MG Tabs  tablet Commonly known as: XARELTO Take 1 tablet (20 mg total) by mouth daily with supper.   rosuvastatin 20 MG tablet Commonly known as: CRESTOR TAKE ONE TABLET BY MOUTH EVERY DAY What changed: how much to take   tamsulosin 0.4 MG Caps capsule Commonly known as: FLOMAX Take 0.4 mg by mouth daily.   vitamin C 1000 MG tablet Take 1,000 mg by mouth daily.   Vitamin D3 50 MCG (2000 UT) Tabs Take 2,000 Units by mouth daily.   zolpidem 10 MG tablet Commonly known as: AMBIEN Take 10 mg by mouth at bedtime as needed for sleep.        Allergies:  Allergies  Allergen Reactions   Other Anaphylaxis, Shortness Of Breath and Swelling    [place] nuts- "will kill me"   Spironolactone Other (See Comments)    Mastodynia   Clarithromycin Nausea Only   Fenofibrate Micronized Other (See Comments)    Caused weakness/lethargy   Metoprolol Other (See Comments)    Fatigue and malaise   Sulfur Hexafluoride Hives   Tricor [Fenofibrate] Other (See Comments)    Tired/weak   Fish Oil Rash   Meloxicam Other (See Comments)    Achy, fatigue   Metaxalone Other (See Comments)    Doesn't remember   Niaspan [Niacin] Other (See Comments)    Doesn't remember   Sulfa Antibiotics Other (See Comments)    Other reaction(s): Unknown   Sulfadiazine Hives and Rash    Past Medical History, Surgical history, Social history, and Family History were reviewed and updated.  Review of Systems: All other 10 point review of systems is negative.   Physical Exam:  height is '5\' 9"'$  (1.753 m) and weight is 192 lb (87.1 kg). His oral temperature is 98.1 F (36.7 C). His blood pressure is 156/60 (abnormal) and his pulse is 56 (abnormal). His respiration is 20 and oxygen saturation is 97%.   Wt Readings from Last 3 Encounters:  06/17/22 192 lb (87.1 kg)  05/21/22 192 lb (87.1 kg)  04/13/22 189 lb 3.2 oz (85.8 kg)    Ocular: Sclerae unicteric, pupils equal, round and reactive to light Ear-nose-throat:  Oropharynx clear, dentition fair Lymphatic: No cervical or supraclavicular adenopathy Lungs no rales or rhonchi, good excursion bilaterally Heart regular rate and rhythm, no murmur appreciated Abd soft, nontender, positive bowel sounds MSK no focal spinal tenderness, no joint edema Neuro: non-focal, well-oriented, appropriate affect Breasts: Deferred   Lab Results  Component Value Date   WBC 8.7 06/17/2022   HGB 13.2 06/17/2022   HCT 40.4 06/17/2022   MCV 87.6 06/17/2022   PLT 194 06/17/2022   Lab Results  Component Value Date   FERRITIN 135.1 11/03/2017   IRON 102 11/03/2017   IRONPCTSAT 27.5 11/03/2017   Lab Results  Component Value Date  RBC 4.61 06/17/2022   No results found for: "KPAFRELGTCHN", "LAMBDASER", "KAPLAMBRATIO" No results found for: "IGGSERUM", "IGA", "IGMSERUM" No results found for: "TOTALPROTELP", "ALBUMINELP", "A1GS", "A2GS", "BETS", "BETA2SER", "GAMS", "MSPIKE", "SPEI"   Chemistry      Component Value Date/Time   NA 143 12/16/2021 1251   NA 142 08/04/2021 1113   K 4.5 12/16/2021 1251   CL 107 12/16/2021 1251   CO2 27 12/16/2021 1251   BUN 20 12/16/2021 1251   BUN 21 08/04/2021 1113   CREATININE 1.43 (H) 12/16/2021 1251   CREATININE 1.05 02/12/2016 1008      Component Value Date/Time   CALCIUM 9.8 12/16/2021 1251   ALKPHOS 73 12/16/2021 1251   AST 18 12/16/2021 1251   ALT 17 12/16/2021 1251   BILITOT 0.6 12/16/2021 1251       Impression and Plan: Mr. Shuler is a very pleasant 77 yo caucasian gentleman with history of unprovoked RLE DVT and bilateral PTE on lifelong anticoagulation with Xarelto.   He will continue his same regimen. No changes.  Follow-up in 6 months.   Lottie Dawson, NP 1/31/20241:28 PM

## 2022-07-13 NOTE — Progress Notes (Unsigned)
HPI  M former Smoker (54 pk yrs) followed for  for asthma/ bronchitis complicated by Tobacco use, CAD/ CABG,  PAD/ Claudication, GERD, PE 05/24/2018, Nocturnal Hypoxemia, PAFib, GERD, Covid infection 9/ 2021,  PFT 09/08/12   ? Not done NPSG 12/27/19- [facility]/  No OSA but desat to 67%, desat time 45 minutes dx Nocturnal Hypoxemia) (Had Sleep Study- [facility]/  No OSA but desat to 67%, desat time 45 minutes dx Nocturnal Hypoxemia) PFT 05/30/21- minimal obstruction w/o resp to BD. Normal DLCO. FEV1/FVC 0.76 --------------------------------------------------------------------------------------   04/13/22- 77 year old male former smoker (28 pack years) followed for asthma/bronchitis, Nocturnal Hypoxemia, complicated by ASCVD/ CAD/CABG, PAD, PAFib, GERD, PE/ DVT 05/2018/ Xarelto, IBS,   Unprovoked RLE DVT and bilateral PE 05/24/2018./ Xarelto,  Managed by [facility], Covid infection 9//2021 , Home O2 2L/ [employer]-    Started by Dr Einar Gip -Albuterol HFA, Neb albuterol, Ambien 10 Covid vax-no Flu vax- today senior --------Pt is having some congestion, has been treated previously by pcp over the last month, States the congestion is not completely going away. He has been coughing more over the past month with waxing and waning yellow/green sputum but probably no definite fever.  He had been treated with doxycycline, Kenalog injection, prednisone, azithromycin, Levaquin.  Then started on budesonide nebulizer solution for 8 days which improved him.  After that ran out, over the past week cough has been coming back again and again becoming discolored.  Some maxillary fullness if he leans over.  Not clear if he is having any upper airway discharge.  He reports chest x-ray was negative for acute process. CXR 06/06/21- IMPRESSION: No significant change from prior. Chronic hyperinflation. No acute lung process.  07/14/22-  77 year old male former smoker (28 pack years) followed for asthma/bronchitis, Nocturnal Hypoxemia,  complicated by ASCVD/ CAD/CABG, PAD, PAFib, GERD, PE/ DVT 05/2018/ Xarelto, IBS,   Unprovoked RLE DVT and bilateral PE 05/24/2018./ Xarelto,  Managed by [facility], Covid infection 9//2021 , Home O2 2L/ [employer]-    Started by Dr Einar Gip -Albuterol HFA, Neb albuterol, Ambien 10, neb Pulmicort Covid vax-no Flu vax- had -------Pt states he is doing okay, advises he occ has coughing spells with some phlegm. He states he is doing well today and no cough With variable bronchitis.  Some days she coughs up some brown phlegm and feels rattle, other days she feels completely clear with no cough.  Albuterol rescue inhaler cost several $100.  He is not using any inhaler but does use his nebulizer machine.  We are going to try a sample of Trelegy.   ROS-see HPI + = positive Constitutional:   No-   weight loss, night sweats, fevers, chills, fatigue, lassitude. HEENT:   No-  headaches, difficulty swallowing, tooth/dental problems, sore throat,       No-  sneezing, itching, ear ache, nasal congestion, post nasal drip,  CV:  No-   chest pain, orthopnea, PND, swelling in lower extremities, anasarca, dizziness, palpitations Resp: +-shortness of breath with exertion or at rest.                 productive cough,   non-productive cough,  No- coughing up of blood.              No-   change in color of mucus.  No- wheezing.   Skin: No-   rash or lesions. GI:  No-   heartburn, indigestion, abdominal pain, nausea, vomiting,  GU:  MS:  No-   joint pain or  swelling.  . Neuro-     nothing unusual Psych:  No- change in mood or affect. No depression or anxiety.  No memory loss.  OBJ- Physical Exam General- Alert, Oriented, Affect-appropriate, Distress- none acute Skin- rash-none, lesions- none, excoriation- none Lymphadenopathy- none Head- atraumatic            Eyes- Gross vision intact, PERRLA, conjunctivae and secretions clear            Ears- Hearing, canals-normal            Nose- Clear, no-Septal dev,  mucus, polyps, erosion, perforation             Throat- Mallampati II , mucosa+cobblestoned, drainage- none, tonsils- atrophic, dentures Neck- flexible , trachea midline, no stridor , thyroid nl, carotid no bruit Chest - symmetrical excursion , unlabored           Heart/CV- RRR occasional extra beat , no murmur , no gallop  , no rub, nl s1 s2                           - JVD- none , edema- none, stasis changes- none, varices- none           Lung-  wheeze-none , +distant, cough- none , dullness-none, rub- none           Chest wall-  Abd-  Br/ Gen/ Rectal- Not done, not indicated Extrem- cyanosis- none, clubbing, none, atrophy- none, strength- nl Neuro- grossly intact to observation

## 2022-07-14 ENCOUNTER — Encounter: Payer: Self-pay | Admitting: Internal Medicine

## 2022-07-14 ENCOUNTER — Ambulatory Visit (INDEPENDENT_AMBULATORY_CARE_PROVIDER_SITE_OTHER): Payer: Medicare Other | Admitting: Internal Medicine

## 2022-07-14 ENCOUNTER — Ambulatory Visit (INDEPENDENT_AMBULATORY_CARE_PROVIDER_SITE_OTHER): Payer: Medicare Other

## 2022-07-14 VITALS — BP 114/60 | HR 57 | Ht 69.0 in | Wt 195.0 lb

## 2022-07-14 DIAGNOSIS — J449 Chronic obstructive pulmonary disease, unspecified: Secondary | ICD-10-CM | POA: Diagnosis not present

## 2022-07-14 DIAGNOSIS — I2510 Atherosclerotic heart disease of native coronary artery without angina pectoris: Secondary | ICD-10-CM | POA: Diagnosis not present

## 2022-07-14 NOTE — Assessment & Plan Note (Addendum)
Bronchitis symptoms.  We discussed use of an inhaler. Plan-try sample Trelegy 100, CXR

## 2022-07-14 NOTE — Assessment & Plan Note (Signed)
He denies recent cardiac issues and continues cardiology follow-up.

## 2022-07-14 NOTE — Patient Instructions (Addendum)
Order- sample Trelegy 100  inhale 1 puff then rinse mouth, once daily. If it helps, let me know and we will see what we can do for something affordable.  Order- CXR     dx COPD mixed type

## 2022-07-28 ENCOUNTER — Other Ambulatory Visit: Payer: Self-pay | Admitting: Cardiology

## 2022-07-28 DIAGNOSIS — I10 Essential (primary) hypertension: Secondary | ICD-10-CM

## 2022-08-26 ENCOUNTER — Other Ambulatory Visit: Payer: Self-pay | Admitting: Cardiology

## 2022-08-26 DIAGNOSIS — F411 Generalized anxiety disorder: Secondary | ICD-10-CM

## 2022-09-03 ENCOUNTER — Encounter: Payer: Self-pay | Admitting: Cardiology

## 2022-09-03 NOTE — Telephone Encounter (Signed)
From pt

## 2022-10-26 ENCOUNTER — Other Ambulatory Visit: Payer: Self-pay | Admitting: Cardiology

## 2022-10-26 DIAGNOSIS — I10 Essential (primary) hypertension: Secondary | ICD-10-CM

## 2022-11-25 ENCOUNTER — Ambulatory Visit: Payer: BLUE CROSS/BLUE SHIELD | Admitting: Cardiology

## 2022-12-07 ENCOUNTER — Ambulatory Visit: Payer: BLUE CROSS/BLUE SHIELD | Admitting: Cardiology

## 2022-12-15 ENCOUNTER — Ambulatory Visit: Payer: Medicare Other | Admitting: Cardiology

## 2022-12-15 ENCOUNTER — Encounter: Payer: Self-pay | Admitting: Cardiology

## 2022-12-15 VITALS — BP 138/56 | HR 53 | Resp 16 | Ht 69.0 in | Wt 189.0 lb

## 2022-12-15 DIAGNOSIS — I25118 Atherosclerotic heart disease of native coronary artery with other forms of angina pectoris: Secondary | ICD-10-CM

## 2022-12-15 DIAGNOSIS — I739 Peripheral vascular disease, unspecified: Secondary | ICD-10-CM

## 2022-12-15 DIAGNOSIS — Z86711 Personal history of pulmonary embolism: Secondary | ICD-10-CM

## 2022-12-15 DIAGNOSIS — E7800 Pure hypercholesterolemia, unspecified: Secondary | ICD-10-CM

## 2022-12-15 DIAGNOSIS — I10 Essential (primary) hypertension: Secondary | ICD-10-CM

## 2022-12-15 NOTE — Progress Notes (Signed)
Primary Physician/Referring:  Hadley Pen, MD  Patient ID: Brian Brock, male    DOB: Mar 31, 1946, 77 y.o.   MRN: [id number]  Chief Complaint  Patient presents with   Coronary artery disease of native artery of native heart wi   Coronary Artery Disease   PAD   Hypertension   Follow-up    6 months   HPI:    Brian Brock  is a 77 y.o. Caucasian male with CAD with multiple angioplasties over the years, due to restenosis underwent CABG on 09/12/2012 but had recurrence of angina and LIMA occluded needing LAD stenting in 2016.  Past medical history significant for peripheral artery disease with left iliac artery and left SFA stenting in 2014 and drug-coated balloon angioplasty followed by DCB on 09/05/2019 for left common femoral artery.   Past medical history also significant for hypertension, hyperglycemia, hyperlipidemia, hypothyroidism, spontaneous DVT and pulmonary embolism in January 2020 and has been on Xarelto since then.  He has chronic dyspnea on exertion and COPD with quit tobacco use  in Apr 2022, nocturnal hypoxemia on home O2.    Patient is here for a 6 month follow-up visit, states that he is feeling the best he has in quite a while. Dyspnea has remained stable.  Denies chest pain, denies symptoms of claudication.  Past Medical History:  Diagnosis Date   AF (atrial fibrillation) (HCC)    happened after CABG, has not had it since   Allergic rhinitis    Allergy    Anginal pain (HCC) 1997   Arthritis    "lower back" (09/26/2015)   Childhood asthma    Chronic bronchitis (HCC)    Yearly   COPD (chronic obstructive pulmonary disease) (HCC)    Coronary artery disease    Multiple stents   Dyslipidemia    Dysrhythmia    Femoral hernia of right side 12/28/2016   GERD (gastroesophageal reflux disease)    History of hiatal hernia    Hyperlipemia    Hypertension    Hypothyroidism    Irritable bowel syndrome    Myocardial infarct [facility]) 1997   "mild"   PE  (pulmonary thromboembolism) (HCC) 05/2018   Peripheral vascular disease (HCC)    prior stenting   Pre-diabetes    Restless legs     Social History   Tobacco Use   Smoking status: Former    Current packs/day: 0.00    Average packs/day: 0.5 packs/day for 56.0 years (28.0 ttl pk-yrs)    Types: Cigarettes    Start date: 08/23/1964    Quit date: 08/23/2020    Years since quitting: 2.3   Smokeless tobacco: Former    Types: Chew    Quit date: 05/21/2021  Substance Use Topics   Alcohol use: Yes    Alcohol/week: 1.0 standard drink of alcohol    Types: 1 Standard drinks or equivalent per week    Comment: 09/26/2015 "varies; might have a few drinks q 2 months"   Marital status: Married   ROS   Review of Systems  Cardiovascular:  Positive for dyspnea on exertion. Negative for chest pain, claudication and leg swelling.  Gastrointestinal:  Negative for melena.   Objective  Blood pressure (!) 138/56, pulse (!) 53, resp. rate 16, height 5\' 9"  (1.753 m), weight 189 lb (85.7 kg), SpO2 95%.     12/15/2022   10:49 AM 07/14/2022    2:20 PM 06/17/2022    1:10 PM  Vitals with BMI  Height 5\' 9"  5'  9" 5\' 9"   Weight 189 lbs 195 lbs 192 lbs  BMI 27.9 28.78 28.34  Systolic 138 114 132  Diastolic 56 60 60  Pulse 53 57 56     Physical Exam Constitutional:      Appearance: He is well-developed.  Neck:     Vascular: Carotid bruit (bilateral) present. No JVD.  Cardiovascular:     Rate and Rhythm: Regular rhythm. Bradycardia present.     Pulses:          Carotid pulses are 2+ on the right side with bruit and 2+ on the left side.      Femoral pulses are 2+ on the right side with bruit and 2+ on the left side with bruit.      Popliteal pulses are 1+ on the right side and 2+ on the left side.       Dorsalis pedis pulses are 0 on the right side and 0 on the left side.       Posterior tibial pulses are 0 on the right side and 0 on the left side.     Heart sounds: Normal heart sounds.  Pulmonary:      Effort: Pulmonary effort is normal.     Breath sounds: Rales (bilateral diffuse) present.  Abdominal:     General: Bowel sounds are normal.     Palpations: Abdomen is soft.  Musculoskeletal:     Right lower leg: Edema (Trace ankle) present.     Left lower leg: Edema (Trace ankle only) present.    Laboratory examination:   Recent Labs    12/16/21 1251 06/17/22 1250  NA 143 142  K 4.5 3.9  CL 107 105  CO2 27 28  GLUCOSE 110* 161*  BUN 20 13  CREATININE 1.43* 1.18  CALCIUM 9.8 9.4  GFRNONAA 51* >60      Latest Ref Rng & Units 06/17/2022   12:50 PM 12/16/2021   12:51 PM 08/04/2021   11:13 AM  CMP  Glucose 70 - 99 mg/dL 440  102  725   BUN 8 - 23 mg/dL 13  20  21    Creatinine 0.61 - 1.24 mg/dL 3.66  4.40  3.47   Sodium 135 - 145 mmol/L 142  143  142   Potassium 3.5 - 5.1 mmol/L 3.9  4.5  4.0   Chloride 98 - 111 mmol/L 105  107  106   CO2 22 - 32 mmol/L 28  27  23    Calcium 8.9 - 10.3 mg/dL 9.4  9.8  8.6   Total Protein 6.5 - 8.1 g/dL 6.5  6.9    Total Bilirubin 0.3 - 1.2 mg/dL 0.8  0.6    Alkaline Phos 38 - 126 U/L 65  73    AST 15 - 41 U/L 18  18    ALT 0 - 44 U/L 16  17        Latest Ref Rng & Units 06/17/2022   12:50 PM 12/16/2021   12:51 PM 06/18/2021   10:08 AM  CBC  WBC 4.0 - 10.5 K/uL 8.7  11.0  9.9   Hemoglobin 13.0 - 17.0 g/dL 42.5  95.6  38.7   Hematocrit 39.0 - 52.0 % 40.4  44.5  44.0   Platelets 150 - 400 K/uL 194  205  232    Last lipids Lab Results  Component Value Date   CHOL 123 01/02/2021   HDL 36 (L) 01/02/2021   LDLCALC 67 01/02/2021   TRIG 106  01/02/2021   CHOLHDL 2.9 02/07/2018   External labs:   Labs 08/05/2022:  Hb 14.5/HCT 43.9, platelets 224.  Sodium 141, potassium 4.2, BUN 21, creatinine 1.27, EGFR 59 mL, LFTs normal.  Vitamin D42.9.  Vitamin B12 normal at 565.  TSH normal at 3.586.  Serum testosterone normal at 624.   Radiology:   CT angio chest 12/16/2020: Coronary atherosclerosis SP CABG. Mediastinum normal. Lungs show  airway thickening bilaterally with intermittent narrowing.  Mucoid impaction in the right lower lobe where there is streaky opacity and volume loss.  Otherwise no significant abnormality.  CT angiogram of the abdomen 02/27/2021: 1. Atherosclerosis throughout the abdomen and pelvis. Aortic Atherosclerosis (ICD10-I70.0). 2. Left iliac artery stents are patent. Occlusive disease involving the origin and proximal aspect of the internal iliac arteries bilaterally. 3. Mixed plaque involving the origin of the celiac trunk with approximately 50% narrowing. SMA and IMA are patent.   NON-VASCULAR   1. Increased consolidation and bronchiectasis in the medial right lower lobe and new patchy peripheral densities in left lower lobe. Findings are concerning for areas of pneumonia or aspiration.  Cardiac Studies:   Coronary angiogram and stenting to his LAD and RCA  in 1997, 1998, and in 2004 to his proximal left circumflex coronary artery.  Due to recurrent restenosis underwent CABG on 09/12/2012 with off-pump LIMA to LAD and SVG to OM1.  CABG  by Dr. Dorris Fetch on 09/12/2012 with an off-pump LIMA to LAD (Occluded by angoigram in 2016) and SVG to circumflex marginal 1 vessel.  Coronary angiogram 07/24/2014: High-grade stenosis to the proximal LAD, patent circumflex stent, patent SVG to OM1 however LIMA to LAD occluded.  Successful stenting to proximal LAD with 3.0 x 34 mm resolute DES, postdilated to 3.4 mm.  Echocardiogram 08/07/2019:  Normal LV systolic function with visual EF 50-55%. Left ventricle cavity is normal in size. Normal global wall motion. No obvious regional wall  motion abnormalities. Mild concentric hypertrophy of the left ventricle. Indeterminate diastolic filling pattern, normal LAP. Calculated EF 50%.  Left atrial cavity is severely dilated.  Visually, right atrial cavity is mildly dilated.  Mild mitral regurgitation.  Moderate tricuspid regurgitation. No evidence of pulmonary  hypertension. RVSP measures 33 mmHg.  Mild pulmonic regurgitation.  IVC is dilated with a respiratory response of >50%.  No significant change compared to previous study 05/25/2018.   Lexiscan (Walking with mod Bruce)Tetrofosmin Stress Test  08/07/2019: Nondiagnostic ECG stress. Perfusion imaging study is abnormal.  Mild inferior, inferolateral small sized defect consistent with ischemia. Left ventricular systolic function is mildly depressed, LVEF calculated at 49% with inferior hypokinesis. Compared to 10/22/2016 report- normal study.  Intermediate risk study.   Peripheral arteriogram 09/05/19: Abdominal aortogram revealed presence of 2 renal arteries 1 on either sides, widely patent.  Mild atherosclerotic changes abdominal aorta noted.  Bilateral aortoiliac bifurcation was widely patent with mild calcification. Left external iliac artery stent placed 2017 and right CFA patch angioplasty 2017 patent.  There was diffuse moderate calcification noted throughout the entire iliac artery system.  Bilateral internal iliac arteries are occluded.  There is a old left external iliac artery stent which has a 40 to 50% in-stent restenosis focally without pressure gradient.  Left common iliac artery has a 80% stenosis with a 20 to 30 mm pressure gradient. Left common femoral artery is very long and profunda bifurcation happens distal than normal, the left common femoral artery has a calcific 80 to 90% stenosis, brisk flow in the left SFA and old left  SFA stent (2004) is widely patent.  Below the left knee there is two-vessel runoff with occluded left PT.   Right common femoral artery patch angioplasty site is widely patent.  Right SFA shows mild diffuse disease with moderate amount of calcification.  Previously placed right mid SFA stent (2008) is widely patent.  There is three-vessel runoff below the right knee.  Intervention data: 1. Left common femoral artery: PTA with Shockwave lithotripsy with a 7.0 x  60 mm shockwave balloon followed by drug-coated balloon angioplasty with a 8 mm x 40 mm Lutonix angioplasty, stenosis reduced from 85% to less than 10% with brisk flow. 2. PTA and self-expanding 10.0 x 40 mm absolute Pro stent to the left common iliac artery followed by post angioplasty with a 9.0 x 40 mm Mustang at 8 atmospheric pressure for 60 seconds, stenosis reduced from 80% to 0%.  There was also left external iliac artery old stent that had a 50% stenosis was also stented with the same self-expanding stent overlapped followed by balloon angioplasty.  Overall no residual stenosis.  ABI 09/26/2019:  This exam reveals mildly decreased perfusion of the right lower extremity, noted at the anterior tibial artery level (ABI 0.83) and mildly decreased  perfusion of the left lower extremity, noted at the anterior tibial artery level (ABI 0.86). Compared to 08/31/2019, bilateral ABI 0.81. (H/O Left CIA stent and left CFA angioplasty).  Carotid artery duplex 02/17/2022: No hemodynamically significant arterial disease in the internal carotid artery bilaterally. Antegrade left vertebral artery flow. Compared to the study done on 01/07/2021, left ICA stenosis of 16 to 49%.  No longer present.  There is very mild plaque noted in the bilateral carotid arteries.  EKG   EKG 12/15/2022: Marked sinus bradycardia at rate of 52 bpm, otherwise normal EKG.  Compared to 05/21/2022, no change.   Allergies & Medications   Allergies  Allergen Reactions   Other Anaphylaxis, Shortness Of Breath and Swelling    Estonia nuts- "will kill me"   Spironolactone Other (See Comments)    Mastodynia   Clarithromycin Nausea Only   Fenofibrate Micronized Other (See Comments)    Caused weakness/lethargy   Metoprolol Other (See Comments)    Fatigue and malaise   Sulfur Hexafluoride Hives   Tricor [Fenofibrate] Other (See Comments)    Tired/weak   Fish Oil Rash   Meloxicam Other (See Comments)    Achy, fatigue   Metaxalone  Other (See Comments)    Doesn't remember   Niaspan [Niacin] Other (See Comments)    Doesn't remember   Sulfa Antibiotics Other (See Comments)    Other reaction(s): Unknown   Sulfadiazine Hives and Rash    Current Outpatient Medications:    acetaminophen (TYLENOL) 500 MG tablet, Take 500-1,000 mg by mouth every 6 (six) hours as needed for moderate pain. , Disp: , Rfl:    albuterol (PROVENTIL) (2.5 MG/3ML) 0.083% nebulizer solution, Use 3mL by nebulization every 6 hours and as needed and for wheezing or shortness of breath., Disp: 360 mL, Rfl: 6   amLODipine (NORVASC) 5 MG tablet, TAKE 1 TABLET ONCE DAILY, Disp: 30 tablet, Rfl: 2   Ascorbic Acid (VITAMIN C) 1000 MG tablet, Take 1,000 mg by mouth daily., Disp: , Rfl:    budesonide (PULMICORT) 0.5 MG/2ML nebulizer solution, 1 neb twice daily- rinse mouth, Disp: 60 mL, Rfl: 12   cetirizine (ZYRTEC) 10 MG tablet, Take 10 mg by mouth daily., Disp: , Rfl:    cholecalciferol (VITAMIN D3) 25 MCG (1000  UNIT) tablet, Take 1,000 Units by mouth daily., Disp: , Rfl:    finasteride (PROSCAR) 5 MG tablet, Take 1 tablet by mouth daily., Disp: , Rfl:    levothyroxine (SYNTHROID, LEVOTHROID) 25 MCG tablet, Take 25 mcg by mouth daily before breakfast. , Disp: , Rfl:    loperamide (IMODIUM A-D) 2 MG tablet, Take 2 mg by mouth 4 (four) times daily as needed for diarrhea or loose stools., Disp: , Rfl:    Melatonin 12 MG TABS, Take 12 mg by mouth at bedtime. , Disp: , Rfl:    nitroGLYCERIN (NITROSTAT) 0.4 MG SL tablet, Place 1 tablet (0.4 mg total) under the tongue every 5 (five) minutes as needed for chest pain., Disp: 25 tablet, Rfl: 11   OXYGEN, Inhale 2 L into the lungs at bedtime., Disp: , Rfl:    pantoprazole (PROTONIX) 40 MG tablet, Take 1 tablet (40 mg total) by mouth 2 (two) times daily. (Patient taking differently: Take 40 mg by mouth at bedtime.), Disp: 60 tablet, Rfl: 0   propranolol (INDERAL) 10 MG tablet, TAKE ONE TABLET BY MOUTH 2 TIMES A DAY, Disp: 60  tablet, Rfl: 2   rivaroxaban (XARELTO) 20 MG TABS tablet, Take 1 tablet (20 mg total) by mouth daily with supper., Disp: 90 tablet, Rfl: 3   rosuvastatin (CRESTOR) 20 MG tablet, TAKE ONE TABLET BY MOUTH EVERY DAY (Patient taking differently: Take 10 mg by mouth daily.), Disp: 90 tablet, Rfl: 3   tamsulosin (FLOMAX) 0.4 MG CAPS capsule, Take 0.4 mg by mouth daily., Disp: , Rfl:    Turmeric (QC TUMERIC COMPLEX PO), Take 1,000 mg by mouth daily., Disp: , Rfl:    zolpidem (AMBIEN) 10 MG tablet, Take 10 mg by mouth at bedtime as needed for sleep. , Disp: , Rfl:   Assessment     ICD-10-CM   1. Coronary artery disease of native artery of native heart with stable angina pectoris (HCC)  I25.118 EKG 12-Lead    2. Peripheral artery disease  I73.9     3. Primary hypertension  I10     4. Hypercholesteremia  E78.00     5. History of pulmonary embolus (PE)  Z86.711       No orders of the defined types were placed in this encounter.  Medications Discontinued During This Encounter  Medication Reason   cyclobenzaprine (FLEXERIL) 10 MG tablet     Orders Placed This Encounter  Procedures   EKG 12-Lead    Recommendations:   Brian Brock  is a 77 y.o. Caucasian male with CAD with multiple angioplasties over the years, due to restenosis underwent CABG on 09/12/2012 but had recurrence of angina and LIMA occluded needing LAD stenting in 2016.  Past medical history significant for peripheral artery disease with left iliac artery and left SFA stenting in 2014 and drug-coated balloon angioplasty followed by DCB on 09/05/2019 for left common femoral artery.   Past medical history also significant for hypertension, hyperglycemia, hyperlipidemia, hypothyroidism, spontaneous DVT and pulmonary embolism in January 2020 and has been on Xarelto since then.  He has chronic dyspnea on exertion and COPD with quit tobacco use  in Apr 2022 but is vaping, nocturnal hypoxemia on home O2.  1. Coronary artery disease of  native artery of native heart with stable angina pectoris [facility]) Patient remains angina free presently on amlodipine and propranolol. - EKG 12-Lead  2. Peripheral artery disease He has no symptoms of claudication.  His physical examination reveals absent pedal pulses compared to  6 months ago when he had palpable PT pulse on the left and DP pulse on the right.  However in view of absence symptoms, no evidence of limb ischemia continue present management, he is presently on Xarelto 20 mg daily.  Not on aspirin or Plavix to reduce bleeding complication.  3. Primary hypertension Blood pressure is well-controlled.  4. Hypercholesteremia Lipids in excellent control, presently on high intensity high-dose Crestor 20 mg daily.  5. History of pulmonary embolus (PE) Patient has history of pulmonary embolism and DVT that is spontaneous that occurred in 2020 and has been on Xarelto since then.  I discussed with him that usual therapy is for 3 to 6 months for first episode in the absence of hypercoagulable state however smoking contributed to his DVT.  Although patient has quit smoking cigarettes, he is still vaping and this may still be a risk factor for recurrence of DVT and PE.  He could certainly still be on aspirin and discontinue Xarelto however patient would prefer to continue Xarelto for now.  He will try to quit vaping and would like to discuss further on his next office visit in 6 months but that he can come off of anticoagulation.  No changes in the medications were done today.  No tests were ordered.  Other orders - Turmeric (QC TUMERIC COMPLEX PO); Take 1,000 mg by mouth daily. - cholecalciferol (VITAMIN D3) 25 MCG (1000 UNIT) tablet; Take 1,000 Units by mouth daily. Otherwise stable from cardiovascular standpoint, I will see him back in 6 months for follow-up.    Brian Decamp, MD, [facility] 12/15/2022, 11:35 AM Office: [phone] Fax: [phone] Pager: [phone]

## 2022-12-16 ENCOUNTER — Inpatient Hospital Stay: Payer: Medicare Other | Attending: Family

## 2022-12-16 ENCOUNTER — Inpatient Hospital Stay (HOSPITAL_BASED_OUTPATIENT_CLINIC_OR_DEPARTMENT_OTHER): Payer: Medicare Other | Admitting: Family

## 2022-12-16 ENCOUNTER — Encounter: Payer: Self-pay | Admitting: Family

## 2022-12-16 VITALS — BP 123/56 | HR 50 | Temp 98.1°F | Resp 18 | Wt 189.1 lb

## 2022-12-16 DIAGNOSIS — I2699 Other pulmonary embolism without acute cor pulmonale: Secondary | ICD-10-CM | POA: Diagnosis not present

## 2022-12-16 DIAGNOSIS — Z86711 Personal history of pulmonary embolism: Secondary | ICD-10-CM | POA: Diagnosis not present

## 2022-12-16 DIAGNOSIS — Z7901 Long term (current) use of anticoagulants: Secondary | ICD-10-CM | POA: Insufficient documentation

## 2022-12-16 DIAGNOSIS — I824Y1 Acute embolism and thrombosis of unspecified deep veins of right proximal lower extremity: Secondary | ICD-10-CM | POA: Diagnosis not present

## 2022-12-16 DIAGNOSIS — Z86718 Personal history of other venous thrombosis and embolism: Secondary | ICD-10-CM | POA: Diagnosis present

## 2022-12-16 LAB — CBC WITH DIFFERENTIAL (CANCER CENTER ONLY)
Abs Immature Granulocytes: 0.02 10*3/uL (ref 0.00–0.07)
Basophils Absolute: 0.1 10*3/uL (ref 0.0–0.1)
Basophils Relative: 1 %
Eosinophils Absolute: 0.3 10*3/uL (ref 0.0–0.5)
Eosinophils Relative: 4 %
HCT: 40.7 % (ref 39.0–52.0)
Hemoglobin: 13.3 g/dL (ref 13.0–17.0)
Immature Granulocytes: 0 %
Lymphocytes Relative: 21 %
Lymphs Abs: 1.8 10*3/uL (ref 0.7–4.0)
MCH: 28.7 pg (ref 26.0–34.0)
MCHC: 32.7 g/dL (ref 30.0–36.0)
MCV: 87.7 fL (ref 80.0–100.0)
Monocytes Absolute: 0.7 10*3/uL (ref 0.1–1.0)
Monocytes Relative: 8 %
Neutro Abs: 5.7 10*3/uL (ref 1.7–7.7)
Neutrophils Relative %: 66 %
Platelet Count: 206 10*3/uL (ref 150–400)
RBC: 4.64 MIL/uL (ref 4.22–5.81)
RDW: 13.7 % (ref 11.5–15.5)
WBC Count: 8.5 10*3/uL (ref 4.0–10.5)
nRBC: 0 % (ref 0.0–0.2)

## 2022-12-16 LAB — CMP (CANCER CENTER ONLY)
ALT: 9 U/L (ref 0–44)
AST: 15 U/L (ref 15–41)
Albumin: 3.8 g/dL (ref 3.5–5.0)
Alkaline Phosphatase: 75 U/L (ref 38–126)
Anion gap: 8 (ref 5–15)
BUN: 20 mg/dL (ref 8–23)
CO2: 25 mmol/L (ref 22–32)
Calcium: 8.9 mg/dL (ref 8.9–10.3)
Chloride: 108 mmol/L (ref 98–111)
Creatinine: 1.25 mg/dL — ABNORMAL HIGH (ref 0.61–1.24)
GFR, Estimated: 59 mL/min — ABNORMAL LOW (ref >60)
Glucose, Bld: 147 mg/dL — ABNORMAL HIGH (ref 70–99)
Potassium: 4.1 mmol/L (ref 3.5–5.1)
Sodium: 141 mmol/L (ref 135–145)
Total Bilirubin: 0.6 mg/dL (ref 0.3–1.2)
Total Protein: 6.7 g/dL (ref 6.5–8.1)

## 2022-12-16 NOTE — Progress Notes (Signed)
Hematology and Oncology Follow Up Visit  YARO THONE [id number] Nov 07, 1945 77 y.o. 12/16/2022   Principle Diagnosis:  Unprovoked RLE DVT and bilateral PTE Extensive acute DVT involving the R femoral, popliteal, posterior tibial and peroneal vein on doppler Small bilateral PTE without evidence of RV strain on CTA chest; no malignancy on scans    Current Therapy:        Xarelto 20mg  daily - 05/24/2018 - present, lifelong   Interim History:  Mr. Hagerman is here today for follow-up. He is doing well and has no complaints at this time.  He continues to tolerate Xarelto nicely. No blood loss, bruising or petechiae.  He has occasional palpitations.  No fever, chills, n/v, cough, rash, SOB, dizziness, chest pain, abdominal pain or changes in bowel or bladder habits.  He has occasional swelling in this great toes.  He notes intermittent tingling in his left thumb.  No falls or syncope.  Appetite and hydration are good. Weight is stable at 189 lbs.   ECOG Performance Status: 1 - Symptomatic but completely ambulatory  Medications:  Allergies as of 12/16/2022       Reactions   Other Anaphylaxis, Shortness Of Breath, Swelling   [place] nuts- "will kill me"   Spironolactone Other (See Comments)   Mastodynia   Clarithromycin Nausea Only   Fenofibrate Micronized Other (See Comments)   Caused weakness/lethargy   Metoprolol Other (See Comments)   Fatigue and malaise   Sulfur Hexafluoride Hives   Tricor [fenofibrate] Other (See Comments)   Tired/weak   Fish Oil Rash   Meloxicam Other (See Comments)   Achy, fatigue   Metaxalone Other (See Comments)   Doesn't remember   Niaspan [niacin] Other (See Comments)   Doesn't remember   Sulfa Antibiotics Other (See Comments)   Other reaction(s): Unknown   Sulfadiazine Hives, Rash        Medication List        Accurate as of December 16, 2022 12:53 PM. If you have any questions, ask your nurse or doctor.          acetaminophen 500  MG tablet Commonly known as: TYLENOL Take 500-1,000 mg by mouth every 6 (six) hours as needed for moderate pain.   albuterol (2.5 MG/3ML) 0.083% nebulizer solution Commonly known as: PROVENTIL Use 3mL by nebulization every 6 hours and as needed and for wheezing or shortness of breath.   amLODipine 5 MG tablet Commonly known as: NORVASC TAKE 1 TABLET ONCE DAILY   budesonide 0.5 MG/2ML nebulizer solution Commonly known as: Pulmicort 1 neb twice daily- rinse mouth   cetirizine 10 MG tablet Commonly known as: ZYRTEC Take 10 mg by mouth daily.   cholecalciferol 25 MCG (1000 UNIT) tablet Commonly known as: VITAMIN D3 Take 1,000 Units by mouth daily.   finasteride 5 MG tablet Commonly known as: PROSCAR Take 1 tablet by mouth daily.   levothyroxine 25 MCG tablet Commonly known as: SYNTHROID Take 25 mcg by mouth daily before breakfast.   loperamide 2 MG tablet Commonly known as: IMODIUM A-D Take 2 mg by mouth 4 (four) times daily as needed for diarrhea or loose stools.   Melatonin 12 MG Tabs Take 12 mg by mouth at bedtime.   nitroGLYCERIN 0.4 MG SL tablet Commonly known as: NITROSTAT Place 1 tablet (0.4 mg total) under the tongue every 5 (five) minutes as needed for chest pain.   OXYGEN Inhale 2 L into the lungs at bedtime.   pantoprazole 40 MG tablet Commonly known  as: PROTONIX Take 1 tablet (40 mg total) by mouth 2 (two) times daily. What changed: when to take this   propranolol 10 MG tablet Commonly known as: INDERAL TAKE ONE TABLET BY MOUTH 2 TIMES A DAY   QC TUMERIC COMPLEX PO Take 1,000 mg by mouth daily.   rivaroxaban 20 MG Tabs tablet Commonly known as: XARELTO Take 1 tablet (20 mg total) by mouth daily with supper.   rosuvastatin 20 MG tablet Commonly known as: CRESTOR TAKE ONE TABLET BY MOUTH EVERY DAY What changed: how much to take   tamsulosin 0.4 MG Caps capsule Commonly known as: FLOMAX Take 0.4 mg by mouth daily.   vitamin C 1000 MG  tablet Take 1,000 mg by mouth daily.   zolpidem 10 MG tablet Commonly known as: AMBIEN Take 10 mg by mouth at bedtime as needed for sleep.        Allergies:  Allergies  Allergen Reactions   Other Anaphylaxis, Shortness Of Breath and Swelling    [place] nuts- "will kill me"   Spironolactone Other (See Comments)    Mastodynia   Clarithromycin Nausea Only   Fenofibrate Micronized Other (See Comments)    Caused weakness/lethargy   Metoprolol Other (See Comments)    Fatigue and malaise   Sulfur Hexafluoride Hives   Tricor [Fenofibrate] Other (See Comments)    Tired/weak   Fish Oil Rash   Meloxicam Other (See Comments)    Achy, fatigue   Metaxalone Other (See Comments)    Doesn't remember   Niaspan [Niacin] Other (See Comments)    Doesn't remember   Sulfa Antibiotics Other (See Comments)    Other reaction(s): Unknown   Sulfadiazine Hives and Rash    Past Medical History, Surgical history, Social history, and Family History were reviewed and updated.  Review of Systems: All other 10 point review of systems is negative.   Physical Exam:  vitals were not taken for this visit.   Wt Readings from Last 3 Encounters:  12/15/22 189 lb (85.7 kg)  07/14/22 195 lb (88.5 kg)  06/17/22 192 lb (87.1 kg)    Ocular: Sclerae unicteric, pupils equal, round and reactive to light Ear-nose-throat: Oropharynx clear, dentition fair Lymphatic: No cervical or supraclavicular adenopathy Lungs no rales or rhonchi, good excursion bilaterally Heart regular rate and rhythm, no murmur appreciated Abd soft, nontender, positive bowel sounds MSK no focal spinal tenderness, no joint edema Neuro: non-focal, well-oriented, appropriate affect Breasts: Deferred   Lab Results  Component Value Date   WBC 8.7 06/17/2022   HGB 13.2 06/17/2022   HCT 40.4 06/17/2022   MCV 87.6 06/17/2022   PLT 194 06/17/2022   Lab Results  Component Value Date   FERRITIN 135.1 11/03/2017   IRON 102 11/03/2017    IRONPCTSAT 27.5 11/03/2017   Lab Results  Component Value Date   RBC 4.61 06/17/2022   No results found for: "KPAFRELGTCHN", "LAMBDASER", "KAPLAMBRATIO" No results found for: "IGGSERUM", "IGA", "IGMSERUM" No results found for: "TOTALPROTELP", "ALBUMINELP", "A1GS", "A2GS", "BETS", "BETA2SER", "GAMS", "MSPIKE", "SPEI"   Chemistry      Component Value Date/Time   NA 142 06/17/2022 1250   NA 142 08/04/2021 1113   K 3.9 06/17/2022 1250   CL 105 06/17/2022 1250   CO2 28 06/17/2022 1250   BUN 13 06/17/2022 1250   BUN 21 08/04/2021 1113   CREATININE 1.18 06/17/2022 1250   CREATININE 1.05 02/12/2016 1008      Component Value Date/Time   CALCIUM 9.4 06/17/2022 1250  ALKPHOS 65 06/17/2022 1250   AST 18 06/17/2022 1250   ALT 16 06/17/2022 1250   BILITOT 0.8 06/17/2022 1250       Impression and Plan: Mr. 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   He will continue his same regimen. No changes.  Follow-up in 6 months.   Eileen Stanford, NP 7/31/202412:53 PM

## 2023-01-06 ENCOUNTER — Ambulatory Visit (INDEPENDENT_AMBULATORY_CARE_PROVIDER_SITE_OTHER): Payer: Medicare Other | Admitting: Podiatry

## 2023-01-06 ENCOUNTER — Ambulatory Visit (INDEPENDENT_AMBULATORY_CARE_PROVIDER_SITE_OTHER): Payer: Medicare Other

## 2023-01-06 DIAGNOSIS — M19071 Primary osteoarthritis, right ankle and foot: Secondary | ICD-10-CM

## 2023-01-06 DIAGNOSIS — M25571 Pain in right ankle and joints of right foot: Secondary | ICD-10-CM

## 2023-01-06 DIAGNOSIS — M2540 Effusion, unspecified joint: Secondary | ICD-10-CM

## 2023-01-06 DIAGNOSIS — M722 Plantar fascial fibromatosis: Secondary | ICD-10-CM

## 2023-01-06 NOTE — Progress Notes (Signed)
HPI: 77 y.o. male presents today with concern of pain in the right ankle.  Denies trauma.  States he has noticed swelling along the outside of the ankle.  Hurts more when ambulating.  Also feels like his right foot is shorter than the left with the toes contracting/bending on the right foot.  Past Medical History:  Diagnosis Date   AF (atrial fibrillation) (HCC)    happened after CABG, has not had it since   Allergic rhinitis    Allergy    Anginal pain (HCC) 1997   Arthritis    "lower back" (09/26/2015)   Childhood asthma    Chronic bronchitis (HCC)    Yearly   COPD (chronic obstructive pulmonary disease) (HCC)    Coronary artery disease    Multiple stents   Dyslipidemia    Dysrhythmia    Femoral hernia of right side 12/28/2016   GERD (gastroesophageal reflux disease)    History of hiatal hernia    Hyperlipemia    Hypertension    Hypothyroidism    Irritable bowel syndrome    Myocardial infarct (HCC) 1997   "mild"   PE (pulmonary thromboembolism) (HCC) 05/2018   Peripheral vascular disease (HCC)    prior stenting   Pre-diabetes    Restless legs     Past Surgical History:  Procedure Laterality Date   CARDIAC CATHETERIZATION  2004   had remote tandem stents in 1997 and 1998 for LAD lesions, RCA stent in 1997 , midprto=ion, and a proximal RCA  SENT IN 1999, all DES; some were IVIS  guide.   CARDIAC CATHETERIZATION  09/07/2012   demonstrated severe 2- vessel disease,patent LAD,patient circ and patent right stents, but 90% ostial LAD whic was new.   CAROTID DOPPLER  100/24/11   RGT BULB and PROXIMAL ICA 0-49%; LFT. ICA  0-49 ; RGT SUBCLAVIAN ARTERY < 50% ; LFT SUBCLAVIAN normal patency; RGT  and LFT  VERTEBRAL ARTERIES   COLONOSCOPY  11/06/2016   Diverticulosis of colon. Internal hemorrhoids.    CORONARY ANGIOPLASTY     CORONARY ANGIOPLASTY WITH STENT PLACEMENT  '97, '98, '04, '06, '08, '10   seven cardiac stents   CORONARY ARTERY BYPASS GRAFT N/A 09/12/2012   Procedure:  OFF PUMP CORONARY ARTERY BYPASS GRAFTING (CABG);  Surgeon: Loreli Slot, MD;  Location: [facility] OR;  Service: Open Heart Surgery;  Laterality: N/A;  Times two using left internal mammary artery and endoscopically harvested right saphenous vein  ;LIMA-LAD and SVG -OM1   DOPPLER ECHOCARDIOGRAPHY  11/23/2012,09/30/2010    LV EF 50-55%----09/30/2010-LV EF = >55%   ENDARTERECTOMY FEMORAL Right 12/19/2015   Procedure: ENDARTERECTOMY RIGHT FEMORAL ARTERY;  Surgeon: Nada Libman, MD;  Location: [facility] OR;  Service: Vascular;  Laterality: Right;   event monitor  11/04/2012-11/17/2012   pt denies this hx on 07/24/2014   EYE SURGERY Bilateral    cataract   FEMORAL ARTERY STENT Bilateral 7/06  8/08   Lt SFA '06, Rt SFA 8/08   HERNIA REPAIR  10/2008   "repaired w/gallbladder OR"   HERNIA REPAIR  07/2016   INGUINAL HERNIA REPAIR Right 12/28/2016   Procedure: OPEN REPAIR RIGHT GROIN HERNIA WITH MESH;  Surgeon: Claud Kelp, MD;  Location: [facility] OR;  Service: General;  Laterality: Right;   INGUINAL HERNIA REPAIR Right 02/21/2020   Procedure: LAPAROSCOPIC RIGHT INGUINAL HERNIA REPAIR WITH MESH;  Surgeon: Axel Filler, MD;  Location: [facility] OR;  Service: General;  Laterality: Right;   LAPAROSCOPIC CHOLECYSTECTOMY  June 2010  LEFT HEART CATHETERIZATION WITH CORONARY ANGIOGRAM N/A 09/07/2012   Procedure: LEFT HEART CATHETERIZATION WITH CORONARY ANGIOGRAM;  Surgeon: Lennette Bihari, MD;  Location: [facility] CATH LAB;  Service: Cardiovascular;  Laterality: N/A;   LEFT HEART CATHETERIZATION WITH CORONARY/GRAFT ANGIOGRAM N/A 07/24/2014   Procedure: LEFT HEART CATHETERIZATION WITH Isabel Caprice;  Surgeon: Lennette Bihari, MD; LAD 70%, 50% ISR, mLAD 99%, CFX 30%, patent stent, RCA patent, LIMA-LAD occluded, SVG-OM 1 patent, EF 50-55%   LEXISCAN STRESS TEST  09/06/2012   LOWER ARTERIAL DOPPLER  03/25/2012   FGT CIA 0-49%; RGT CFA AND SFA AT STENT 0-49%; RGT DISTAL SFA = <50%; LFT. EIA  50-69%; LFT. CFA 0-49% ;;LFT. SFA  AT  STENT NORMAL PATENCY   LOWER EXTREMITY ANGIOGRAPHY N/A 09/05/2019   Procedure: LOWER EXTREMITY ANGIOGRAPHY;  Surgeon: Yates Decamp, MD;  Location: [facility] INVASIVE CV LAB;  Service: Cardiovascular;  Laterality: N/A;   NM MYOVIEW LTD  05/06/2012   EF -STUDY NOT GATED, NORMAL STRESS NUCLEAR ,NORMAL LV FUNCTION   PATCH ANGIOPLASTY Right 12/19/2015   Procedure: PATCH ANGIOPLASTY RIGHT FEMORAL ARTERY;  Surgeon: Nada Libman, MD;  Location: [facility] OR;  Service: Vascular;  Laterality: Right;   PERCUTANEOUS CORONARY STENT INTERVENTION (PCI-S) Right 07/24/2014   Procedure: PERCUTANEOUS CORONARY STENT INTERVENTION (PCI-S);  Surgeon: Lennette Bihari, MD; ostial LAD PTCA with angioscope scoring balloon, 3.0 x 34 mm Resolute DES extending from the ostium to the mid LAD, covering all significant stenoses    PERIPHERAL INTRAVASCULAR LITHOTRIPSY  09/05/2019   Procedure: INTRAVASCULAR LITHOTRIPSY;  Surgeon: Yates Decamp, MD;  Location: [facility] INVASIVE CV LAB;  Service: Cardiovascular;;  left common femoral   PERIPHERAL VASCULAR CATHETERIZATION N/A 09/26/2015   Procedure: Abdominal Aortogram w/Lower Extremity;  Surgeon: Runell Gess, MD;  Location: [facility] INVASIVE CV LAB;  Service: Cardiovascular;  Laterality: N/A;   PERIPHERAL VASCULAR CATHETERIZATION Right 02/20/2016   Procedure: Peripheral Vascular Atherectomy;  Surgeon: Runell Gess, MD;  Location: [facility] INVASIVE CV LAB;  Service: Cardiovascular;  Laterality: Right;  SFA   PERIPHERAL VASCULAR CATHETERIZATION Right 02/20/2016   Procedure: Peripheral Vascular Balloon Angioplasty;  Surgeon: Runell Gess, MD;  Location: [facility] INVASIVE CV LAB;  Service: Cardiovascular;  Laterality: Right;  SFA   PERIPHERAL VASCULAR INTERVENTION  09/05/2019   Procedure: PERIPHERAL VASCULAR INTERVENTION;  Surgeon: Yates Decamp, MD;  Location: [facility] INVASIVE CV LAB;  Service: Cardiovascular;;  left common iliac   TONSILLECTOMY  1950's   UPPER GASTROINTESTINAL ENDOSCOPY      Allergies  Allergen Reactions    Other Anaphylaxis, Shortness Of Breath and Swelling    [place]ia nuts- "will kill me"   Spironolactone Other (See Comments)    Mastodynia   Clarithromycin Nausea Only   Fenofibrate Micronized Other (See Comments)    Caused weakness/lethargy   Metoprolol Other (See Comments)    Fatigue and malaise   Sulfur Hexafluoride Hives   Tricor [Fenofibrate] Other (See Comments)    Tired/weak   Fish Oil Rash   Meloxicam Other (See Comments)    Achy, fatigue   Metaxalone Other (See Comments)    Doesn't remember   Niaspan [Niacin] Other (See Comments)    Doesn't remember   Sulfa Antibiotics Other (See Comments)    Other reaction(s): Unknown   Sulfadiazine Hives and Rash    Review of Systems  Musculoskeletal:  Positive for joint pain.      Physical Exam: There were no vitals filed for this visit.  General: The patient is alert and oriented x3 in  no acute distress.  Dermatology: Skin is warm, dry and supple bilateral lower extremities. Interspaces are clear of maceration and debris.    Vascular: Palpable pedal pulses bilaterally. Capillary refill within normal limits.  Minimal localized edema right lateral ankle  Neurological: Light touch sensation grossly intact bilateral feet.   Musculoskeletal Exam: Pain on palpation along anterolateral right ankle.  No crepitus with range of motion of the ankle or subtalar joint.  Hammertoe deformities noted 1 through 5.  There is medial angulation/adduction of toes 3 4 and 5 at the level of the DIPJ.  There is tightness of the plantar fascia and flexor tendons on the right foot.  The hallux is almost fully reducible with manipulation.  Radiographic Exam (right ankle, 2 views, and right foot 3 weightbearing views, 01/06/2023):  Normal osseous mineralization.  Mild joint space narrowing along the lateral ankle gutter.  Joint space narrowing of the first MPJ and IPJ with contracture at the IPJ of the hallux.  Medial angulation of toes 3 4 and 5 at the DIPJ  level.  Mild dorsal spurring at the talonavicular joint.  Assessment/Plan of Care: 1. Pain in joint of right ankle   2. Joint swelling   3. Plantar fascial fibromatosis   4. Osteoarthritis of right ankle and foot      Discussed clinical findings with patient today.  With the patient's consent and after sterile skin prep, a corticosteroid injection was administered to the right anterolateral ankle joint using a combination of 1% lidocaine plain, 0.5% Sensorcaine plain, and Kenalog 10 for total of 1.25 cc administered.  A Band-Aid was applied.  He tolerated this well.  Keep the area clean over the next 24 hours  Recommend the patient have a formal round of physical therapy to address these several issues.  The may be an imbalance that is causing the extra tightness on the right foot only.  Follow-up in 4 to 6 weeks or as needed after PT   Lyah Millirons DBurna Mortimer, DPM, FACFAS [facility]     [address] [place], [place] [address]                Office [phone]  Fax [phone]

## 2023-03-26 ENCOUNTER — Telehealth: Payer: Self-pay | Admitting: Gastroenterology

## 2023-03-26 NOTE — Telephone Encounter (Signed)
Inbound call from patient stating he has been having diarrhea and constipation on and off for the past 2 weeks. States he would have diarrhea for a few days and then it switches to constipation. States he take 2-3 imodium's a day. Patient has been scheduled for 2/27. Requesting a call to discuss options to help in the mean times. Please advise, thank you.

## 2023-03-29 NOTE — Telephone Encounter (Signed)
Patient c/o alternating constipation and diarrhea with some recent episodes of unnoticed fecal incontinence. Takes imodium several times a day when needed which helps somewhat. Occasional abdominal discomfort, but not a large complaint for patient  Scheduled patient to see Boone Master, PA-C on 03/31/23 at 1030. Patient verbailzes understanding and agrees with this plan.

## 2023-03-31 ENCOUNTER — Encounter: Payer: Self-pay | Admitting: Gastroenterology

## 2023-03-31 ENCOUNTER — Ambulatory Visit (INDEPENDENT_AMBULATORY_CARE_PROVIDER_SITE_OTHER): Payer: Medicare Other | Admitting: Gastroenterology

## 2023-03-31 ENCOUNTER — Ambulatory Visit (INDEPENDENT_AMBULATORY_CARE_PROVIDER_SITE_OTHER)
Admission: RE | Admit: 2023-03-31 | Discharge: 2023-03-31 | Disposition: A | Discharge status: Home / Self Care | Payer: Medicare Other | Source: Ambulatory Visit | Attending: Gastroenterology | Admitting: Gastroenterology

## 2023-03-31 VITALS — BP 140/80 | Ht 69.0 in | Wt 196.2 lb

## 2023-03-31 DIAGNOSIS — K5900 Constipation, unspecified: Secondary | ICD-10-CM

## 2023-03-31 DIAGNOSIS — R194 Change in bowel habit: Secondary | ICD-10-CM

## 2023-03-31 NOTE — Progress Notes (Signed)
Chief Complaint: Change in bowel habits Primary GI MD: Dr. 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    Last seen October 2022 by Dr. Chales Abrahams and at that time was having IBS with alternating diarrhea and constipation ongoing since 1970s.  Patient was put on restoral 1 tab p.o. daily.  In addition to his alternating Imodium and milk of magnesia.  Patient states he continues to struggle with alternating constipation and diarrhea.  He will have 2 to 3 days of watery diarrhea in which she takes Imodium and then he will go without a bowel movement for 3 to 4 days and feel constipated he will then have a bowel movement that is formed followed by "explosive loose stool" and then revert back to his diarrheal state.  This has been ongoing for many years.  He takes Imodium, Pepto-Bismol, and milk of magnesia alternating.  Denies abdominal pain, nausea, vomiting, GERD, dysphagia, weight loss, melena, hematochezia.   PREVIOUS GI WORKUP   Colonoscopy 10/2016: -Moderate sigmoid diverticulosis -Internal hemorrhoids -Negative random colon biopsies for microscopic colitis -Otherwise normal to TI. -Repeat only if with any new problems.   EGD 11/04/2017: neg. Performed while on Plavix at that time  Past Medical History:  Diagnosis Date   AF (atrial fibrillation) (HCC)    happened after CABG, has not had it since   Allergic rhinitis    Allergy    Anginal pain (HCC) 1997   Arthritis    "lower back" (09/26/2015)   Childhood asthma    Chronic bronchitis (HCC)    Yearly   COPD (chronic obstructive pulmonary disease) (HCC)    Coronary artery disease    Multiple stents   Dyslipidemia    Dysrhythmia    Femoral hernia of right side 12/28/2016   GERD (gastroesophageal reflux disease)    History of  hiatal hernia    Hyperlipemia    Hypertension    Hypothyroidism    Irritable bowel syndrome    Myocardial infarct (HCC) 1997   "mild"   PE (pulmonary thromboembolism) (HCC) 05/2018   Peripheral vascular disease (HCC)    prior stenting   Pre-diabetes    Restless legs     Past Surgical History:  Procedure Laterality Date   CARDIAC CATHETERIZATION  2004   had remote tandem stents in 1997 and 1998 for LAD lesions, RCA stent in 1997 , midprto=ion, and a proximal RCA  SENT IN 1999, all DES; some were IVIS  guide.   CARDIAC CATHETERIZATION  09/07/2012   demonstrated severe 2- vessel disease,patent LAD,patient circ and patent right stents, but 90% ostial LAD whic was new.   CAROTID DOPPLER  100/24/11   RGT BULB and PROXIMAL ICA 0-49%; LFT. ICA  0-49 ; RGT SUBCLAVIAN ARTERY < 50% ; LFT SUBCLAVIAN normal patency; RGT  and LFT  VERTEBRAL ARTERIES   COLONOSCOPY  11/06/2016   Diverticulosis of colon. Internal hemorrhoids.    CORONARY ANGIOPLASTY     CORONARY ANGIOPLASTY WITH STENT PLACEMENT  '97, '98, '04, '06, '08, '10   seven cardiac stents   CORONARY ARTERY BYPASS GRAFT N/A 09/12/2012   Procedure: OFF PUMP CORONARY ARTERY BYPASS GRAFTING (CABG);  Surgeon: Loreli Slot, MD;  Location: [facility] OR;  Service: Open Heart Surgery;  Laterality: N/A;  Times two using left internal mammary artery and endoscopically harvested right  saphenous vein  ;LIMA-LAD and SVG -OM1   DOPPLER ECHOCARDIOGRAPHY  11/23/2012,09/30/2010    LV EF 50-55%----09/30/2010-LV EF = >55%   ENDARTERECTOMY FEMORAL Right 12/19/2015   Procedure: ENDARTERECTOMY RIGHT FEMORAL ARTERY;  Surgeon: Nada Libman, MD;  Location: [facility] OR;  Service: Vascular;  Laterality: Right;   event monitor  11/04/2012-11/17/2012   pt denies this hx on 07/24/2014   EYE SURGERY Bilateral    cataract   FEMORAL ARTERY STENT Bilateral 7/06  8/08   Lt SFA '06, Rt SFA 8/08   HERNIA REPAIR  10/2008   "repaired w/gallbladder OR"   HERNIA REPAIR  07/2016    INGUINAL HERNIA REPAIR Right 12/28/2016   Procedure: OPEN REPAIR RIGHT GROIN HERNIA WITH MESH;  Surgeon: Claud Kelp, MD;  Location: [facility] OR;  Service: General;  Laterality: Right;   INGUINAL HERNIA REPAIR Right 02/21/2020   Procedure: LAPAROSCOPIC RIGHT INGUINAL HERNIA REPAIR WITH MESH;  Surgeon: Axel Filler, MD;  Location[facility] OR;  Service: General;  Laterality: Right;   LAPAROSCOPIC CHOLECYSTECTOMY  June 2010   LEFT HEART CATHETERIZATION WITH CORONARY ANGIOGRAM N/A 09/07/2012   Procedure: LEFT HEART CATHETERIZATION WITH CORONARY ANGIOGRAM;  Surgeon: Lennette Bihari, MD;  Location: [facility] CATH LAB;  Service: Cardiovascular;  Laterality: N/A;   LEFT HEART CATHETERIZATION WITH CORONARY/GRAFT ANGIOGRAM N/A 07/24/2014   Procedure: LEFT HEART CATHETERIZATION WITH Isabel Caprice;  Surgeon: Lennette Bihari, MD; LAD 70%, 50% ISR, mLAD 99%, CFX 30%, patent stent, RCA patent, LIMA-LAD occluded, SVG-OM 1 patent, EF 50-55%   LEXISCAN STRESS TEST  09/06/2012   LOWER ARTERIAL DOPPLER  03/25/2012   FGT CIA 0-49%; RGT CFA AND SFA AT STENT 0-49%; RGT DISTAL SFA = <50%; LFT. EIA  50-69%; LFT. CFA 0-49% ;;LFT. SFA  AT STENT NORMAL PATENCY   LOWER EXTREMITY ANGIOGRAPHY N/A 09/05/2019   Procedure: LOWER EXTREMITY ANGIOGRAPHY;  Surgeon: Yates Decamp, MD;  Location: [facility] INVASIVE CV LAB;  Service: Cardiovascular;  Laterality: N/A;   NM MYOVIEW LTD  05/06/2012   EF -STUDY NOT GATED, NORMAL STRESS NUCLEAR ,NORMAL LV FUNCTION   PATCH ANGIOPLASTY Right 12/19/2015   Procedure: PATCH ANGIOPLASTY RIGHT FEMORAL ARTERY;  Surgeon: Nada Libman, MD;  Location: [facility] OR;  Service: Vascular;  Laterality: Right;   PERCUTANEOUS CORONARY STENT INTERVENTION (PCI-S) Right 07/24/2014   Procedure: PERCUTANEOUS CORONARY STENT INTERVENTION (PCI-S);  Surgeon: Lennette Bihari, MD; ostial LAD PTCA with angioscope scoring balloon, 3.0 x 34 mm Resolute DES extending from the ostium to the mid LAD, covering all significant stenoses    PERIPHERAL  INTRAVASCULAR LITHOTRIPSY  09/05/2019   Procedure: INTRAVASCULAR LITHOTRIPSY;  Surgeon: Yates Decamp, MD;  Location: [facility] INVASIVE CV LAB;  Service: Cardiovascular;;  left common femoral   PERIPHERAL VASCULAR CATHETERIZATION N/A 09/26/2015   Procedure: Abdominal Aortogram w/Lower Extremity;  Surgeon: Runell Gess, MD;  Location: [facility] INVASIVE CV LAB;  Service: Cardiovascular;  Laterality: N/A;   PERIPHERAL VASCULAR CATHETERIZATION Right 02/20/2016   Procedure: Peripheral Vascular Atherectomy;  Surgeon: Runell Gess, MD;  Location: [facility] INVASIVE CV LAB;  Service: Cardiovascular;  Laterality: Right;  SFA   PERIPHERAL VASCULAR CATHETERIZATION Right 02/20/2016   Procedure: Peripheral Vascular Balloon Angioplasty;  Surgeon: Runell Gess, MD;  Location: [facility] INVASIVE CV LAB;  Service: Cardiovascular;  Laterality: Right;  SFA   PERIPHERAL VASCULAR INTERVENTION  09/05/2019   Procedure: PERIPHERAL VASCULAR INTERVENTION;  Surgeon: Yates Decamp, MD;  Location: [facility] INVASIVE CV LAB;  Service: Cardiovascular;;  left common iliac   TONSILLECTOMY  1950's  UPPER GASTROINTESTINAL ENDOSCOPY      Current Outpatient Medications  Medication Sig Dispense Refill   acetaminophen (TYLENOL) 500 MG tablet Take 500-1,000 mg by mouth every 6 (six) hours as needed for moderate pain.      albuterol (PROVENTIL) (2.5 MG/3ML) 0.083% nebulizer solution Use 3mL by nebulization every 6 hours and as needed and for wheezing or shortness of breath. 360 mL 6   amLODipine (NORVASC) 5 MG tablet TAKE 1 TABLET ONCE DAILY 30 tablet 2   Ascorbic Acid (VITAMIN C) 1000 MG tablet Take 1,000 mg by mouth daily.     budesonide (PULMICORT) 0.5 MG/2ML nebulizer solution 1 neb twice daily- rinse mouth 60 mL 12   cetirizine (ZYRTEC) 10 MG tablet Take 10 mg by mouth daily.     cholecalciferol (VITAMIN D3) 25 MCG (1000 UNIT) tablet Take 1,000 Units by mouth daily.     finasteride (PROSCAR) 5 MG tablet Take 1 tablet by mouth daily.     levothyroxine  (SYNTHROID, LEVOTHROID) 25 MCG tablet Take 25 mcg by mouth daily before breakfast.      loperamide (IMODIUM A-D) 2 MG tablet Take 2 mg by mouth 4 (four) times daily as needed for diarrhea or loose stools.     Melatonin 12 MG TABS Take 12 mg by mouth at bedtime.      nitroGLYCERIN (NITROSTAT) 0.4 MG SL tablet Place 1 tablet (0.4 mg total) under the tongue every 5 (five) minutes as needed for chest pain. 25 tablet 11   OXYGEN Inhale 2 L into the lungs at bedtime.     pantoprazole (PROTONIX) 40 MG tablet Take 1 tablet (40 mg total) by mouth 2 (two) times daily. (Patient taking differently: Take 40 mg by mouth at bedtime.) 60 tablet 0   propranolol (INDERAL) 10 MG tablet TAKE ONE TABLET BY MOUTH 2 TIMES A DAY 60 tablet 2   rivaroxaban (XARELTO) 20 MG TABS tablet Take 1 tablet (20 mg total) by mouth daily with supper. 90 tablet 3   rosuvastatin (CRESTOR) 20 MG tablet TAKE ONE TABLET BY MOUTH EVERY DAY (Patient taking differently: Take 10 mg by mouth daily.) 90 tablet 3   tamsulosin (FLOMAX) 0.4 MG CAPS capsule Take 0.4 mg by mouth daily.     Turmeric (QC TUMERIC COMPLEX PO) Take 1,000 mg by mouth daily.     zolpidem (AMBIEN) 10 MG tablet Take 10 mg by mouth at bedtime as needed for sleep.      No current facility-administered medications for this visit.    Allergies as of 03/31/2023 - Review Complete 01/06/2023  Allergen Reaction Noted   Other Anaphylaxis, Shortness Of Breath, and Swelling 05/24/2018   Spironolactone Other (See Comments) 07/02/2021   Clarithromycin Nausea Only 10/01/2017   Fenofibrate micronized Other (See Comments) 10/21/2016   Metoprolol Other (See Comments) 11/01/2012   Sulfur hexafluoride Hives 05/21/2022   Tricor [fenofibrate] Other (See Comments) 07/05/2014   Fish oil Rash 07/05/2014   Meloxicam Other (See Comments) 10/21/2016   Metaxalone Other (See Comments) 10/04/2018   Niaspan [niacin] Other (See Comments) 07/05/2014   Sulfa antibiotics Other (See Comments)  05/29/2021   Sulfadiazine Hives and Rash 10/21/2016    Family History  Problem Relation Age of Onset   Coronary artery disease Father 41   Heart attack Father    Coronary artery disease Brother 28       CABG in 2000   Deep vein thrombosis Brother    Colon cancer Neg Hx    Esophageal cancer Neg  Hx    Rectal cancer Neg Hx    Stomach cancer Neg Hx     Social History   Socioeconomic History   Marital status: Married    Spouse name: Not on file   Number of children: 1   Years of education: Not on file   Highest education level: Not on file  Occupational History   Not on file  Tobacco Use   Smoking status: Former    Current packs/day: 0.00    Average packs/day: 0.5 packs/day for 56.0 years (28.0 ttl pk-yrs)    Types: Cigarettes    Start date: 08/23/1964    Quit date: 08/23/2020    Years since quitting: 2.6   Smokeless tobacco: Former    Types: Chew    Quit date: 05/21/2021  Vaping Use   Vaping status: Never Used  Substance and Sexual Activity   Alcohol use: Yes    Alcohol/week: 1.0 standard drink of alcohol    Types: 1 Standard drinks or equivalent per week    Comment: 09/26/2015 "varies; might have a few drinks q 2 months"   Drug use: No   Sexual activity: Not Currently  Other Topics Concern   Not on file  Social History Narrative   Not on file   Social Determinants of Health   Financial Resource Strain: Not on file  Food Insecurity: Not on file  Transportation Needs: Not on file  Physical Activity: Not on file  Stress: Not on file  Social Connections: Not on file  Intimate Partner Violence: Not on file    Review of Systems:    Constitutional: No weight loss, fever, chills, weakness or fatigue HEENT: Eyes: No change in vision               Ears, Nose, Throat:  No change in hearing or congestion Skin: No rash or itching Cardiovascular: No chest pain, chest pressure or palpitations   Respiratory: No SOB or cough Gastrointestinal: See HPI and otherwise  negative Genitourinary: No dysuria or change in urinary frequency Neurological: No headache, dizziness or syncope Musculoskeletal: No new muscle or joint pain Hematologic: No bleeding or bruising Psychiatric: No history of depression or anxiety    Physical Exam:  Vital signs: There were no vitals taken for this visit.  Constitutional: NAD, Well developed, Well nourished, alert and cooperative Head:  Normocephalic and atraumatic. Eyes:   PEERL, EOMI. No icterus. Conjunctiva pink. Respiratory: Respirations even and unlabored. Lungs clear to auscultation bilaterally.   No wheezes, crackles, or rhonchi.  Cardiovascular:  Regular rate and rhythm. No peripheral edema, cyanosis or pallor.  Gastrointestinal:  Soft, nondistended, nontender. No rebound or guarding. Normal bowel sounds. No appreciable masses or hepatomegaly. Rectal:  Not performed.  Msk:  Symmetrical without gross deformities. Without edema, no deformity or joint abnormality.  Neurologic:  Alert and  oriented x4;  grossly normal neurologically.  Skin:   Dry and intact without significant lesions or rashes. Psychiatric: Oriented to person, place and time. Demonstrates good judgement and reason without abnormal affect or behaviors.   RELEVANT LABS AND IMAGING: CBC    Component Value Date/Time   WBC 8.5 12/16/2022 1246   WBC 8.7 02/24/2021 0835   RBC 4.64 12/16/2022 1246   HGB 13.3 12/16/2022 1246   HGB 14.5 08/31/2019 1435   HCT 40.7 12/16/2022 1246   HCT 42.5 08/31/2019 1435   PLT 206 12/16/2022 1246   PLT 217 08/31/2019 1435   MCV 87.7 12/16/2022 1246   MCV 86 08/31/2019  1435   MCH 28.7 12/16/2022 1246   MCHC 32.7 12/16/2022 1246   RDW 13.7 12/16/2022 1246   RDW 12.6 08/31/2019 1435   LYMPHSABS 1.8 12/16/2022 1246   MONOABS 0.7 12/16/2022 1246   EOSABS 0.3 12/16/2022 1246   BASOSABS 0.1 12/16/2022 1246    CMP     Component Value Date/Time   NA 141 12/16/2022 1246   NA 142 08/04/2021 1113   K 4.1 12/16/2022  1246   CL 108 12/16/2022 1246   CO2 25 12/16/2022 1246   GLUCOSE 147 (H) 12/16/2022 1246   BUN 20 12/16/2022 1246   BUN 21 08/04/2021 1113   CREATININE 1.25 (H) 12/16/2022 1246   CREATININE 1.05 02/12/2016 1008   CALCIUM 8.9 12/16/2022 1246   PROT 6.7 12/16/2022 1246   PROT 6.0 02/07/2018 0915   ALBUMIN 3.8 12/16/2022 1246   ALBUMIN 3.9 02/07/2018 0915   AST 15 12/16/2022 1246   ALT 9 12/16/2022 1246   ALKPHOS 75 12/16/2022 1246   BILITOT 0.6 12/16/2022 1246   GFRNONAA 59 (L) 12/16/2022 1246   GFRAA >60 02/14/2020 1159   GFRAA >60 12/12/2019 1034     Assessment/Plan:   Change in bowel habits Constipation, unspecified constipation type Upon description of alternating diarrhea and constipation in which she goes 3 to 4 days without a bowel movement and then has profuse diarrhea I suspect he is experiencing overflow diarrhea which is made worse from his continuous Imodium. - KUB to evaluate stool burden - MiraLAX 1 capful daily - Benefiber daily - Increase water intake and increase exercise - MyChart message made to follow-up in 2 weeks to see how he is doing.  He can adjust the doses above based on his response.   Lara Mulch [facility] 03/31/2023, 10:15 AM  Cc: Hadley Pen, MD

## 2023-03-31 NOTE — Patient Instructions (Addendum)
Start taking Miralax 1 capful (17 grams) 1x / day for 1 week.   If this is not effective, increase to 1 dose 2x / day for 1 week.   If this is still not effective, increase to two capfuls (34 grams) 2x / day.   Can adjust dose as needed based on response. Can take 1/2 cap daily, skip days, or increase per day.    Benefiber daily 1 teaspoon 2-3 times daily in the same drink as your miralax.  Squatty potty with bowel movements (can be purchased on [place], [place], [employer], etc)  Drink 64-100oz water daily   Your provider has requested that you have an abdominal x ray before leaving today. Please go to the basement floor to our Radiology department for the test.  _______________________________________________________  If your blood pressure at your visit was 140/90 or greater, please contact your primary care physician to follow up on this.  _______________________________________________________  If you are age 77 or older, your body mass index should be between 23-30. Your Body mass index is 28.97 kg/m. If this is out of the aforementioned range listed, please consider follow up with your Primary Care Provider.  If you are age 65 or younger, your body mass index should be between 19-25. Your Body mass index is 28.97 kg/m. If this is out of the aformentioned range listed, please consider follow up with your Primary Care Provider.   ________________________________________________________  The [facility] providers would like to encourage you to use [facility] to communicate with providers for non-urgent requests or questions.  Due to long hold times on the telephone, sending your provider a message by [facility] may be a faster and more efficient way to get a response.  Please allow 48 business hours for a response.  Please remember that this is for non-urgent requests.  _______________________________________________________  Thank you for choosing me and [facility].  Bayley  McMichael PA-C

## 2023-06-15 ENCOUNTER — Ambulatory Visit: Payer: Medicare Other | Attending: Cardiology | Admitting: Cardiology

## 2023-06-15 ENCOUNTER — Encounter: Payer: Self-pay | Admitting: Cardiology

## 2023-06-15 VITALS — BP 136/72 | HR 101 | Resp 16 | Ht 69.0 in | Wt 186.0 lb

## 2023-06-15 DIAGNOSIS — I739 Peripheral vascular disease, unspecified: Secondary | ICD-10-CM

## 2023-06-15 DIAGNOSIS — I10 Essential (primary) hypertension: Secondary | ICD-10-CM

## 2023-06-15 DIAGNOSIS — I25118 Atherosclerotic heart disease of native coronary artery with other forms of angina pectoris: Secondary | ICD-10-CM | POA: Diagnosis not present

## 2023-06-15 DIAGNOSIS — E7800 Pure hypercholesterolemia, unspecified: Secondary | ICD-10-CM

## 2023-06-15 NOTE — Patient Instructions (Addendum)
Medication Instructions:  Your physician recommends that you continue on your current medications as directed. Please refer to the Current Medication list given to you today.  *If you need a refill on your cardiac medications before your next appointment, please call your pharmacy*   Lab Work: Have fasting lab work done in the next week or so. Lipid panel This can be done at any [facility].  There is an office on the first floor of our building If you have labs (blood work) drawn today and your tests are completely normal, you will receive your results only by: MyChart Message (if you have MyChart) OR A paper copy in the mail If you have any lab test that is abnormal or we need to change your treatment, we will call you to review the results.   Testing/Procedures: none   Follow-Up: At [facility], you and your health needs are our priority.  As part of our continuing mission to provide you with exceptional heart care, we have created designated Provider Care Teams.  These Care Teams include your primary Cardiologist (physician) and Advanced Practice Providers (APPs -  Physician Assistants and Nurse Practitioners) who all work together to provide you with the care you need, when you need it.  We recommend signing up for the patient portal called "MyChart".  Sign up information is provided on this After Visit Summary.  MyChart is used to connect with patients for Virtual Visits (Telemedicine).  Patients are able to view lab/test results, encounter notes, upcoming appointments, etc.  Non-urgent messages can be sent to your provider as well.   To learn more about what you can do with MyChart, go to [email or web address].    Your next appointment:   12 month(s)  Provider:   Yates Decamp, MD     Other Instructions

## 2023-06-15 NOTE — Progress Notes (Signed)
Cardiology Office Note:  .   Date:  06/15/2023  ID:  Brian Brock, DOB 1945-08-09, MRN [id number] PCP: Hadley Pen, MD  [facility] Providers Cardiologist:  Yates Decamp, MD   History of Present Illness: .   Brian Brock is a 78 y.o.  Caucasian male with CAD with multiple angioplasties over the years, due to restenosis underwent CABG on 09/12/2012 but had recurrence of angina and LIMA occluded needing LAD stenting in 2016.  Past medical history significant for peripheral artery disease with left iliac artery and left SFA stenting in 2014 and drug-coated balloon angioplasty followed by [facility] on 09/05/2019 for left common femoral artery, right femoral patch angioplasty and endarterectomy in the remote past.   Past medical history also significant for hypertension, hyperglycemia, hyperlipidemia, hypothyroidism, spontaneous DVT and pulmonary embolism in January 2020 and has been on Xarelto since then.  He has chronic dyspnea on exertion and COPD with quit tobacco use  in Apr 2022, nocturnal hypoxemia on home O2.    Discussed the use of AI scribe software for clinical note transcription with the patient, who gave verbal consent to proceed.  History of Present Illness   The patient, with a history of vascular disease and sleep apnea, presents with hip pain that is exacerbated by walking. He reports that the pain is manageable when holding onto a shopping cart, but becomes more severe when walking unassisted. The patient uses an oxygen machine at night, but has not noticed any significant improvement in his symptoms. He also reports occasional chest pain, which he describes as possibly angina. The pain is transient and does not last long enough to warrant the use of nitroglycerin. The patient's breathing is stable and he has not experienced any cramps in his legs. He also mentions a new, sore spot along his surgical scar from a previous heart surgery.      Labs   Lab Results  Component  Value Date   CHOL 123 01/02/2021   HDL 36 (L) 01/02/2021   LDLCALC 67 01/02/2021   TRIG 106 01/02/2021   CHOLHDL 2.9 02/07/2018   Lab Results  Component Value Date   NA 141 12/16/2022   K 4.1 12/16/2022   CO2 25 12/16/2022   GLUCOSE 147 (H) 12/16/2022   BUN 20 12/16/2022   CREATININE 1.25 (H) 12/16/2022   CALCIUM 8.9 12/16/2022   GFR 66.54 02/24/2021   EGFR 63 08/04/2021   GFRNONAA 59 (L) 12/16/2022      Latest Ref Rng & Units 12/16/2022   12:46 PM 06/17/2022   12:50 PM 12/16/2021   12:51 PM  BMP  Glucose 70 - 99 mg/dL 846  962  952   BUN 8 - 23 mg/dL 20  13  20    Creatinine 0.61 - 1.24 mg/dL 8.41  3.24  4.01   Sodium 135 - 145 mmol/L 141  142  143   Potassium 3.5 - 5.1 mmol/L 4.1  3.9  4.5   Chloride 98 - 111 mmol/L 108  105  107   CO2 22 - 32 mmol/L 25  28  27    Calcium 8.9 - 10.3 mg/dL 8.9  9.4  9.8       Latest Ref Rng & Units 12/16/2022   12:46 PM 06/17/2022   12:50 PM 12/16/2021   12:51 PM  CBC  WBC 4.0 - 10.5 K/uL 8.5  8.7  11.0   Hemoglobin 13.0 - 17.0 g/dL 02.7  25.3  66.4   Hematocrit 39.0 -  52.0 % 40.7  40.4  44.5   Platelets 150 - 400 K/uL 206  194  205    Review of Systems  Cardiovascular:  Positive for chest pain (stable), claudication (mild) and dyspnea on exertion (stable). Negative for leg swelling.    Physical Exam:   VS:  BP 136/72 (BP Location: Left Arm, Patient Position: Sitting, Cuff Size: Normal)   Pulse (!) 101   Resp 16   Ht 5\' 9"  (1.753 m)   Wt 186 lb (84.4 kg)   SpO2 96%   BMI 27.47 kg/m    Wt Readings from Last 3 Encounters:  06/15/23 186 lb (84.4 kg)  03/31/23 196 lb 3.2 oz (89 kg)  12/16/22 189 lb 1.9 oz (85.8 kg)    Physical Exam Neck:     Vascular: Carotid bruit (bilateral) present. No JVD.  Cardiovascular:     Rate and Rhythm: Normal rate and regular rhythm.     Pulses: Intact distal pulses.          Femoral pulses are 2+ on the right side and 1+ on the left side.      Dorsalis pedis pulses are 0 on the right side and 0  on the left side.       Posterior tibial pulses are 0 on the right side and 0 on the left side.     Heart sounds: Normal heart sounds. No murmur heard.    No gallop.  Pulmonary:     Effort: Pulmonary effort is normal.     Breath sounds: Rhonchi (Scattered) present.  Abdominal:     General: Bowel sounds are normal.     Palpations: Abdomen is soft.  Musculoskeletal:     Right lower leg: No edema.     Left lower leg: No edema.     Studies Reviewed: [name]    Coronary angiogram 07/24/2014:  High-grade stenosis to the proximal LAD, patent circumflex stent, patent SVG to OM1 however LIMA to LAD occluded (CABG on 09/12/2012 with off-pump LIMA to LAD and SVG to OM).   Successful stenting to proximal LAD with 3.0 x 34 mm resolute DES, postdilated to 3.4 mm.   EKG:    EKG 12/15/2022: Marked sinus bradycardia at rate of 52 bpm, otherwise normal EKG.   Medications and allergies    Allergies  Allergen Reactions   Other Anaphylaxis, Shortness Of Breath and Swelling    [place] nuts- "will kill me"   Spironolactone Other (See Comments)    Mastodynia   Clarithromycin Nausea Only   Fenofibrate Micronized Other (See Comments)    Caused weakness/lethargy   Metoprolol Other (See Comments)    Fatigue and malaise   Sulfur Hexafluoride Hives   Tricor [Fenofibrate] Other (See Comments)    Tired/weak   Fish Oil Rash   Meloxicam Other (See Comments)    Achy, fatigue   Metaxalone Other (See Comments)    Doesn't remember   Niaspan [Niacin] Other (See Comments)    Doesn't remember   Sulfa Antibiotics Other (See Comments)    Other reaction(s): Unknown   Sulfadiazine Hives and Rash     Current Outpatient Medications:    acetaminophen (TYLENOL) 500 MG tablet, Take 500-1,000 mg by mouth every 6 (six) hours as needed for moderate pain. , Disp: , Rfl:    albuterol (PROVENTIL) (2.5 MG/3ML) 0.083% nebulizer solution, Use 3mL by nebulization every 6 hours and as needed and for wheezing or shortness of  breath., Disp: 360 mL, Rfl: 6   amLODipine (  NORVASC) 5 MG tablet, TAKE 1 TABLET ONCE DAILY, Disp: 30 tablet, Rfl: 2   Ascorbic Acid (VITAMIN C) 1000 MG tablet, Take 1,000 mg by mouth daily., Disp: , Rfl:    budesonide (PULMICORT) 0.5 MG/2ML nebulizer solution, 1 neb twice daily- rinse mouth, Disp: 60 mL, Rfl: 12   cetirizine (ZYRTEC) 10 MG tablet, Take 10 mg by mouth daily., Disp: , Rfl:    cholecalciferol (VITAMIN D3) 25 MCG (1000 UNIT) tablet, Take 1,000 Units by mouth daily., Disp: , Rfl:    finasteride (PROSCAR) 5 MG tablet, Take 1 tablet by mouth daily., Disp: , Rfl:    levothyroxine (SYNTHROID, LEVOTHROID) 25 MCG tablet, Take 25 mcg by mouth daily before breakfast. , Disp: , Rfl:    loperamide (IMODIUM A-D) 2 MG tablet, Take 2 mg by mouth 4 (four) times daily as needed for diarrhea or loose stools., Disp: , Rfl:    Melatonin 12 MG TABS, Take 12 mg by mouth at bedtime. , Disp: , Rfl:    nitroGLYCERIN (NITROSTAT) 0.4 MG SL tablet, Place 1 tablet (0.4 mg total) under the tongue every 5 (five) minutes as needed for chest pain., Disp: 25 tablet, Rfl: 11   OXYGEN, Inhale 2 L into the lungs at bedtime., Disp: , Rfl:    pantoprazole (PROTONIX) 40 MG tablet, Take 1 tablet (40 mg total) by mouth 2 (two) times daily. (Patient taking differently: Take 40 mg by mouth daily.), Disp: 60 tablet, Rfl: 0   propranolol (INDERAL) 10 MG tablet, TAKE ONE TABLET BY MOUTH 2 TIMES A DAY, Disp: 60 tablet, Rfl: 2   rivaroxaban (XARELTO) 20 MG TABS tablet, Take 1 tablet (20 mg total) by mouth daily with supper., Disp: 90 tablet, Rfl: 3   rosuvastatin (CRESTOR) 20 MG tablet, TAKE ONE TABLET BY MOUTH EVERY DAY (Patient taking differently: Take 10 mg by mouth daily.), Disp: 90 tablet, Rfl: 3   tamsulosin (FLOMAX) 0.4 MG CAPS capsule, Take 0.4 mg by mouth daily., Disp: , Rfl:    Turmeric (QC TUMERIC COMPLEX PO), Take 1,000 mg by mouth daily., Disp: , Rfl:    zolpidem (AMBIEN) 10 MG tablet, Take 10 mg by mouth at bedtime as  needed for sleep. , Disp: , Rfl:    ASSESSMENT AND PLAN: .      ICD-10-CM   1. Coronary artery disease of native artery of native heart with stable angina pectoris (HCC)  I25.118 Lipid Panel With LDL/HDL Ratio    2. Peripheral artery disease  I73.9 Lipid Panel With LDL/HDL Ratio    3. Primary hypertension  I10     4. Hypercholesteremia  E78.00      Assessment and Plan    Peripheral Arterial Disease Patient reports difficulty walking without support, possibly due to hip pain. Unclear if pain is due to vascular disease or arthritis. No reported cramping in calves during walking.  He has decreased pulse in his left femoral artery and has prior history of left femoral angioplasty, has right femoral surgical patch angioplasty.  If symptoms of discomfort get worse, we could consider abdominal aortic duplex and lower extremity arterial duplex.  Presently patient states that his hip pain has been ongoing for several years and has been no change.  -Continue current management and monitor symptoms.  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  Angina Patient reports occasional chest discomfort, possibly angina. Uses Imdur and nitroglycerin as needed with relief of symptoms. -Continue current management  and use of medications as needed.  Presently on amlodipine 5 mg daily and has sublingual nitroglycerin on a as needed basis use.  He also uses propranolol 10 mg twice daily for tremors and also this is helping with blood pressure control and angina.  Nocturnal hypoxemia secondary to COPD Patient uses nightly oxygen via nasal cannula at 4-6 liters.  -Continue current management.  Hyperlipidemia Cholesterol has not been checked in the past year. -Order cholesterol panel. Patient to complete at [facility] in a fasting state.  Presently on Crestor 20 mg daily, 2 years ago LDL was at goal.  Follow-up in 1 year. No changes in medications.    Signed,  Yates Decamp, MD, [facility] 06/15/2023, 2:44 PM [facility] [address] [place], [place] [address] Phone: [phone]. Fax:  [phone]

## 2023-06-16 ENCOUNTER — Encounter: Payer: Self-pay | Admitting: Family

## 2023-06-16 ENCOUNTER — Inpatient Hospital Stay: Payer: Medicare Other | Attending: Hematology & Oncology

## 2023-06-16 ENCOUNTER — Inpatient Hospital Stay (HOSPITAL_BASED_OUTPATIENT_CLINIC_OR_DEPARTMENT_OTHER): Payer: Medicare Other | Admitting: Family

## 2023-06-16 VITALS — BP 121/61 | HR 48 | Temp 98.6°F | Resp 18 | Ht 69.0 in | Wt 186.8 lb

## 2023-06-16 DIAGNOSIS — Z7901 Long term (current) use of anticoagulants: Secondary | ICD-10-CM | POA: Diagnosis not present

## 2023-06-16 DIAGNOSIS — I2699 Other pulmonary embolism without acute cor pulmonale: Secondary | ICD-10-CM

## 2023-06-16 DIAGNOSIS — Z86718 Personal history of other venous thrombosis and embolism: Secondary | ICD-10-CM | POA: Insufficient documentation

## 2023-06-16 DIAGNOSIS — I824Y1 Acute embolism and thrombosis of unspecified deep veins of right proximal lower extremity: Secondary | ICD-10-CM

## 2023-06-16 LAB — CMP (CANCER CENTER ONLY)
ALT: 16 U/L (ref 0–44)
AST: 17 U/L (ref 15–41)
Albumin: 4.1 g/dL (ref 3.5–5.0)
Alkaline Phosphatase: 60 U/L (ref 38–126)
Anion gap: 6 (ref 5–15)
BUN: 17 mg/dL (ref 8–23)
CO2: 30 mmol/L (ref 22–32)
Calcium: 9.2 mg/dL (ref 8.9–10.3)
Chloride: 106 mmol/L (ref 98–111)
Creatinine: 1.2 mg/dL (ref 0.61–1.24)
GFR, Estimated: >60 mL/min (ref >60)
Glucose, Bld: 95 mg/dL (ref 70–99)
Potassium: 5 mmol/L (ref 3.5–5.1)
Sodium: 142 mmol/L (ref 135–145)
Total Bilirubin: 0.9 mg/dL (ref 0.0–1.2)
Total Protein: 6.1 g/dL — ABNORMAL LOW (ref 6.5–8.1)

## 2023-06-16 LAB — CBC WITH DIFFERENTIAL (CANCER CENTER ONLY)
Abs Immature Granulocytes: 0.03 10*3/uL (ref 0.00–0.07)
Basophils Absolute: 0.1 10*3/uL (ref 0.0–0.1)
Basophils Relative: 1 %
Eosinophils Absolute: 0.4 10*3/uL (ref 0.0–0.5)
Eosinophils Relative: 4 %
HCT: 42.9 % (ref 39.0–52.0)
Hemoglobin: 14.2 g/dL (ref 13.0–17.0)
Immature Granulocytes: 0 %
Lymphocytes Relative: 22 %
Lymphs Abs: 2.4 10*3/uL (ref 0.7–4.0)
MCH: 29.1 pg (ref 26.0–34.0)
MCHC: 33.1 g/dL (ref 30.0–36.0)
MCV: 87.9 fL (ref 80.0–100.0)
Monocytes Absolute: 1 10*3/uL (ref 0.1–1.0)
Monocytes Relative: 9 %
Neutro Abs: 6.8 10*3/uL (ref 1.7–7.7)
Neutrophils Relative %: 64 %
Platelet Count: 175 10*3/uL (ref 150–400)
RBC: 4.88 MIL/uL (ref 4.22–5.81)
RDW: 13.2 % (ref 11.5–15.5)
WBC Count: 10.7 10*3/uL — ABNORMAL HIGH (ref 4.0–10.5)
nRBC: 0 % (ref 0.0–0.2)

## 2023-06-16 NOTE — Progress Notes (Signed)
Hematology and Oncology Follow Up Visit  Brian Brock [id number] 05-05-1946 78 y.o. 06/16/2023   Principle Diagnosis:  Unprovoked RLE DVT and bilateral PTE Extensive acute DVT involving the R femoral, popliteal, posterior tibial and peroneal vein on doppler Small bilateral PTE without evidence of RV strain on CTA chest; no malignancy on scans    Current Therapy:        Xarelto 20mg  daily - 05/24/2018 - present, lifelong   Interim History:  Mr. Gola is here today for his 6 month follow-up. He is doing well and has no complaints at this time.  No fever, chills, n/v, cough, rash, dizziness, SOB, chest pain, palpitations, abdominal pain or changes in bowel or bladder habits.  He has chronic constipation and takes Miralax as needed.  No blood loss noted. No abnormal bruising, no petechiae.  No swelling, numbness or tingling in his extremities.  No falls or syncope.  Appetite and hydration are good. Weight is stable at 186 lbs.   ECOG Performance Status: 0 - Asymptomatic  Medications:  Allergies as of 06/16/2023       Reactions   Other Anaphylaxis, Shortness Of Breath, Swelling   [place] nuts- "will kill me"   Spironolactone Other (See Comments)   Mastodynia   Clarithromycin Nausea Only   Fenofibrate Micronized Other (See Comments)   Caused weakness/lethargy   Metoprolol Other (See Comments)   Fatigue and malaise   Sulfur Hexafluoride Hives   Tricor [fenofibrate] Other (See Comments)   Tired/weak   Fish Oil Rash   Meloxicam Other (See Comments)   Achy, fatigue   Metaxalone Other (See Comments)   Doesn't remember   Niaspan [niacin] Other (See Comments)   Doesn't remember   Sulfa Antibiotics Other (See Comments)   Other reaction(s): Unknown   Sulfadiazine Hives, Rash        Medication List        Accurate as of June 16, 2023  1:57 PM. If you have any questions, ask your nurse or doctor.          acetaminophen 500 MG tablet Commonly known as:  TYLENOL Take 500-1,000 mg by mouth every 6 (six) hours as needed for moderate pain.   albuterol (2.5 MG/3ML) 0.083% nebulizer solution Commonly known as: PROVENTIL Use 3mL by nebulization every 6 hours and as needed and for wheezing or shortness of breath.   amLODipine 5 MG tablet Commonly known as: NORVASC TAKE 1 TABLET ONCE DAILY   budesonide 0.5 MG/2ML nebulizer solution Commonly known as: Pulmicort 1 neb twice daily- rinse mouth   cetirizine 10 MG tablet Commonly known as: ZYRTEC Take 10 mg by mouth daily.   cholecalciferol 25 MCG (1000 UNIT) tablet Commonly known as: VITAMIN D3 Take 1,000 Units by mouth daily.   finasteride 5 MG tablet Commonly known as: PROSCAR Take 1 tablet by mouth daily.   levothyroxine 25 MCG tablet Commonly known as: SYNTHROID Take 25 mcg by mouth daily before breakfast.   loperamide 2 MG tablet Commonly known as: IMODIUM A-D Take 2 mg by mouth 4 (four) times daily as needed for diarrhea or loose stools.   Melatonin 12 MG Tabs Take 12 mg by mouth at bedtime.   nitroGLYCERIN 0.4 MG SL tablet Commonly known as: NITROSTAT Place 1 tablet (0.4 mg total) under the tongue every 5 (five) minutes as needed for chest pain.   OXYGEN Inhale 2 L into the lungs at bedtime.   pantoprazole 40 MG tablet Commonly known as: PROTONIX Take 1 tablet (  40 mg total) by mouth 2 (two) times daily. What changed: when to take this   propranolol 10 MG tablet Commonly known as: INDERAL TAKE ONE TABLET BY MOUTH 2 TIMES A DAY   QC TUMERIC COMPLEX PO Take 1,000 mg by mouth daily.   rivaroxaban 20 MG Tabs tablet Commonly known as: XARELTO Take 1 tablet (20 mg total) by mouth daily with supper.   rosuvastatin 20 MG tablet Commonly known as: CRESTOR TAKE ONE TABLET BY MOUTH EVERY DAY What changed: how much to take   tamsulosin 0.4 MG Caps capsule Commonly known as: FLOMAX Take 0.4 mg by mouth daily.   vitamin C 1000 MG tablet Take 1,000 mg by mouth  daily.   zolpidem 10 MG tablet Commonly known as: AMBIEN Take 10 mg by mouth at bedtime as needed for sleep.        Allergies:  Allergies  Allergen Reactions   Other Anaphylaxis, Shortness Of Breath and Swelling    Estonia nuts- "will kill me"   Spironolactone Other (See Comments)    Mastodynia   Clarithromycin Nausea Only   Fenofibrate Micronized Other (See Comments)    Caused weakness/lethargy   Metoprolol Other (See Comments)    Fatigue and malaise   Sulfur Hexafluoride Hives   Tricor [Fenofibrate] Other (See Comments)    Tired/weak   Fish Oil Rash   Meloxicam Other (See Comments)    Achy, fatigue   Metaxalone Other (See Comments)    Doesn't remember   Niaspan [Niacin] Other (See Comments)    Doesn't remember   Sulfa Antibiotics Other (See Comments)    Other reaction(s): Unknown   Sulfadiazine Hives and Rash    Past Medical History, Surgical history, Social history, and Family History were reviewed and updated.  Review of Systems: All other 10 point review of systems is negative.   Physical Exam:  height is 5\' 9"  (1.753 m) and weight is 186 lb 12.8 oz (84.7 kg). His oral temperature is 98.6 F (37 C). His blood pressure is 121/61 and his pulse is 48 (abnormal). His respiration is 18 and oxygen saturation is 100%.   Wt Readings from Last 3 Encounters:  06/16/23 186 lb 12.8 oz (84.7 kg)  06/15/23 186 lb (84.4 kg)  03/31/23 196 lb 3.2 oz (89 kg)    Ocular: Sclerae unicteric, pupils equal, round and reactive to light Ear-nose-throat: Oropharynx clear, dentition fair Lymphatic: No cervical or supraclavicular adenopathy Lungs no rales or rhonchi, good excursion bilaterally Heart regular rate and rhythm, no murmur appreciated Abd soft, nontender, positive bowel sounds MSK no focal spinal tenderness, no joint edema Neuro: non-focal, well-oriented, appropriate affect Breasts: Deferred   Lab Results  Component Value Date   WBC 10.7 (H) 06/16/2023   HGB 14.2  06/16/2023   HCT 42.9 06/16/2023   MCV 87.9 06/16/2023   PLT 175 06/16/2023   Lab Results  Component Value Date   FERRITIN 135.1 11/03/2017   IRON 102 11/03/2017   IRONPCTSAT 27.5 11/03/2017   Lab Results  Component Value Date   RBC 4.88 06/16/2023   No results found for: "KPAFRELGTCHN", "LAMBDASER", "KAPLAMBRATIO" No results found for: "IGGSERUM", "IGA", "IGMSERUM" No results found for: "TOTALPROTELP", "ALBUMINELP", "A1GS", "A2GS", "BETS", "BETA2SER", "GAMS", "MSPIKE", "SPEI"   Chemistry      Component Value Date/Time   NA 141 12/16/2022 1246   NA 142 08/04/2021 1113   K 4.1 12/16/2022 1246   CL 108 12/16/2022 1246   CO2 25 12/16/2022 1246   BUN 20  12/16/2022 1246   BUN 21 08/04/2021 1113   CREATININE 1.25 (H) 12/16/2022 1246   CREATININE 1.05 02/12/2016 1008      Component Value Date/Time   CALCIUM 8.9 12/16/2022 1246   ALKPHOS 75 12/16/2022 1246   AST 15 12/16/2022 1246   ALT 9 12/16/2022 1246   BILITOT 0.6 12/16/2022 1246       Impression and Plan: Mr. 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   He will continue his same regimen. No changes.   Follow-up in 6 months.   Eileen Stanford, NP 1/29/20251:57 PM

## 2023-06-23 ENCOUNTER — Encounter: Payer: Self-pay | Admitting: Cardiology

## 2023-06-23 LAB — LIPID PANEL WITH LDL/HDL RATIO
Cholesterol, Total: 138 mg/dL (ref 100–199)
HDL: 42 mg/dL (ref >39)
LDL Chol Calc (NIH): 73 mg/dL (ref 0–99)
LDL/HDL Ratio: 1.7 {ratio} (ref 0.0–3.6)
Triglycerides: 126 mg/dL (ref 0–149)
VLDL Cholesterol Cal: 23 mg/dL (ref 5–40)

## 2023-07-09 ENCOUNTER — Encounter: Payer: Self-pay | Admitting: Cardiology

## 2023-07-12 NOTE — Progress Notes (Deleted)
 HPI  M former Smoker (54 pk yrs) followed for  for asthma/ bronchitis complicated by Tobacco use, CAD/ CABG,  PAD/ Claudication, GERD, PE 05/24/2018, Nocturnal Hypoxemia, PAFib, GERD, Covid infection 9/ 2021,  PFT 09/08/12   ? Not done NPSG 12/27/19- [facility]/  No OSA but desat to 67%, desat time 45 minutes dx Nocturnal Hypoxemia) (Had Sleep Study- [facility]/  No OSA but desat to 67%, desat time 45 minutes dx Nocturnal Hypoxemia) PFT 05/30/21- minimal obstruction w/o resp to BD. Normal DLCO. FEV1/FVC 0.76 --------------------------------------------------------------------------------------   07/14/22-  78 year old male former smoker (28 pack years) followed for asthma/bronchitis, Nocturnal Hypoxemia, complicated by ASCVD/ CAD/CABG, PAD, PAFib, GERD, PE/ DVT 05/2018/ Xarelto, IBS,   Unprovoked RLE DVT and bilateral PE 05/24/2018./ Xarelto,  Managed by [facility], Covid infection 9//2021 , Home O2 2L/ [facility]-    Started by Dr Jacinto Halim -Albuterol HFA, Neb albuterol, Ambien 10, neb Pulmicort Covid vax-no Flu vax- had -------Pt states he is doing okay, advises he occ has coughing spells with some phlegm. He states he is doing well today and no cough With variable bronchitis.  Some days he coughs up some brown phlegm and feels rattle, other days he feels completely clear with no cough.  Albuterol rescue inhaler cost several $100.  He is not using any inhaler but does use his nebulizer machine.  We are going to try a sample of Trelegy.  07/13/23- 78 year old male former smoker (28 pack years) followed for asthma/bronchitis, Nocturnal Hypoxemia, complicated by ASCVD/ CAD/CABG, PAD, PAFib, GERD, PE/ DVT 05/2018/ Xarelto, IBS,   Unprovoked RLE DVT and bilateral PE 05/24/2018./ Xarelto,  Managed by [facility], Covid infection 9//2021 , Home O2 2L/ [employer]-    Started by Dr Jacinto Halim -Albuterol HFA, Neb albuterol, Ambien 10, neb Pulmicort, sampled Trelegy        CXR 07/14/22 IMPRESSION: No active  cardiopulmonary disease.  ROS-see HPI + = positive Constitutional:   No-   weight loss, night sweats, fevers, chills, fatigue, lassitude. HEENT:   No-  headaches, difficulty swallowing, tooth/dental problems, sore throat,       No-  sneezing, itching, ear ache, nasal congestion, post nasal drip,  CV:  No-   chest pain, orthopnea, PND, swelling in lower extremities, anasarca, dizziness, palpitations Resp: +-shortness of breath with exertion or at rest.                 productive cough,   non-productive cough,  No- coughing up of blood.              No-   change in color of mucus.  No- wheezing.   Skin: No-   rash or lesions. GI:  No-   heartburn, indigestion, abdominal pain, nausea, vomiting,  GU:  MS:  No-   joint pain or swelling.  . Neuro-     nothing unusual Psych:  No- change in mood or affect. No depression or anxiety.  No memory loss.  OBJ- Physical Exam General- Alert, Oriented, Affect-appropriate, Distress- none acute Skin- rash-none, lesions- none, excoriation- none Lymphadenopathy- none Head- atraumatic            Eyes- Gross vision intact, PERRLA, conjunctivae and secretions clear            Ears- Hearing, canals-normal            Nose- Clear, no-Septal dev, mucus, polyps, erosion, perforation             Throat- Mallampati II , mucosa+cobblestoned, drainage- none,  tonsils- atrophic, dentures Neck- flexible , trachea midline, no stridor , thyroid nl, carotid no bruit Chest - symmetrical excursion , unlabored           Heart/CV- RRR occasional extra beat , no murmur , no gallop  , no rub, nl s1 s2                           - JVD- none , edema- none, stasis changes- none, varices- none           Lung-  wheeze-none , +distant, cough- none , dullness-none, rub- none           Chest wall-  Abd-  Br/ Gen/ Rectal- Not done, not indicated Extrem- cyanosis- none, clubbing, none, atrophy- none, strength- nl Neuro- grossly intact to observation

## 2023-07-13 ENCOUNTER — Ambulatory Visit: Payer: Medicare Other | Admitting: Internal Medicine

## 2023-07-15 ENCOUNTER — Ambulatory Visit: Payer: Medicare Other | Admitting: Gastroenterology

## 2023-09-07 ENCOUNTER — Encounter: Payer: Self-pay | Admitting: Internal Medicine

## 2023-09-07 ENCOUNTER — Ambulatory Visit: Payer: Medicare Other | Admitting: Internal Medicine

## 2023-09-07 VITALS — BP 122/62 | HR 55 | Temp 98.0°F | Ht 69.0 in | Wt 191.6 lb

## 2023-09-07 DIAGNOSIS — J449 Chronic obstructive pulmonary disease, unspecified: Secondary | ICD-10-CM

## 2023-09-07 DIAGNOSIS — Z87891 Personal history of nicotine dependence: Secondary | ICD-10-CM

## 2023-09-07 NOTE — Patient Instructions (Signed)
 Gad you seem to be doing well. Please let us  know if we can help.

## 2023-09-07 NOTE — Progress Notes (Signed)
 HPI  M former Smoker (54 pk yrs) followed for  for asthma/ bronchitis complicated by Tobacco use, CAD/ CABG,  PAD/ Claudication, GERD, PE 05/24/2018, Nocturnal Hypoxemia, PAFib, GERD, Covid infection 9/ 2021,  PFT 09/08/12   ? Not done NPSG 12/27/19- [facility]/  No OSA but desat to 67%, desat time 45 minutes dx Nocturnal Hypoxemia) (Had Sleep Study- GNA/  No OSA but desat to 67%, desat time 45 minutes dx Nocturnal Hypoxemia) PFT 05/30/21- minimal obstruction w/o resp to BD. Normal DLCO. 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 He states he is doing well today and no cough With variable bronchitis.  Some days he coughs up some brown phlegm and feels rattle, other days he feels completely clear with no cough.  Albuterol  rescue inhaler cost several $100.  He is not using any inhaler but does use his nebulizer machine.  We are going to try a sample of Trelegy.  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 Some sinus drainage with allergies. Discussed the use of AI  scribe software for clinical note transcription with the patient, who gave verbal consent to proceed.  History of Present Illness   The patient, with a history of obstructive sleep apnea, presents for a CPAP machine replacement and supplies. He reports that his previous machine was set to a pressure of 5-15, but he is open to a self-adjusting machine. He also mentions experiencing recent  fever and sweating- now resolved, but it is unclear if this is related to his primary complaint.  In addition, the patient has a history of respiratory issues, which he describes as 'a little wheezy' at times, particularly when he has a cold. He manages these symptoms with deep breathing and occasional use of a nebulizer with albuterol  and Pulmicort . He also takes Ambien  for sleep, which he reports as necessary for his sleep quality. He mentions that he has not needed to use his oxygen  machine much recently.  The patient also reports that he has been feeling 'stuffy' due to the heavy spring pollen season, but his symptoms improve in purified air. He mentions that he typically gets bronchitis around August or September each year. He also notes that sometimes, changing positions, such as getting into bed, can cause shortness of breath, but this quickly resolves.     CXR 07/14/22 MPRESSION: No active cardiopulmonary disease.  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 Normal oxygen  exchange. Occasional wheezing controlled by deep breathing. - Schedule follow-up in one year. - Advise to contact if questions or issues arise. -Watch need  to reassess overnight oximetry  Common cold Symptoms improving with no significant impact on breathing. - Manage symptoms conservatively.     Seasonal Allergic Rhinitis - symptomatic maanagement  ROS-see HPI + =  positive Constitutional:   No-   weight loss, night sweats, fevers, chills, fatigue, lassitude. HEENT:   No-  headaches, difficulty swallowing, tooth/dental problems, sore throat,       No-  sneezing, itching, ear ache, nasal congestion, post nasal drip,  CV:  No-   chest pain, orthopnea, PND, swelling in lower extremities, anasarca, dizziness, palpitations Resp: +-shortness of breath with exertion or at rest.                 productive cough,   non-productive cough,  No- coughing up of blood.              No-   change in color of mucus.  No- wheezing.   Skin: No-   rash or lesions. GI:  No-   heartburn, indigestion, abdominal pain, nausea, vomiting,  GU:  MS:  No-   joint pain or swelling.  . Neuro-     nothing unusual Psych:  No- change in mood or affect. No depression or anxiety.  No memory loss.  OBJ- Physical Exam General- Alert, Oriented, Affect-appropriate, Distress- none acute Skin- rash-none, lesions- none, excoriation- none Lymphadenopathy- none Head- atraumatic            Eyes- Gross vision intact, PERRLA, conjunctivae and secretions clear            Ears- Hearing, canals-normal            Nose- Clear, no-Septal dev, mucus, polyps, erosion, perforation             Throat- Mallampati II , mucosa+cobblestoned, drainage- none, tonsils- atrophic, dentures Neck- flexible , trachea midline, no stridor , thyroid  nl, carotid no bruit Chest - symmetrical excursion , unlabored           Heart/CV- RRR occasional extra beat , no murmur , no gallop  , no rub, nl s1 s2                           - JVD- none , edema- none, stasis changes- none, varices- none           Lung-  wheeze-none , +distant, cough- none , dullness-none, rub- none           Chest wall-  Abd-  Br/ Gen/ Rectal- Not done, not indicated Extrem- cyanosis- none, clubbing, none, atrophy- none, strength- nl Neuro- grossly intact to observation

## 2023-09-22 ENCOUNTER — Encounter: Payer: Self-pay | Admitting: Internal Medicine

## 2023-11-29 ENCOUNTER — Encounter (HOSPITAL_BASED_OUTPATIENT_CLINIC_OR_DEPARTMENT_OTHER): Payer: Self-pay

## 2023-11-29 ENCOUNTER — Ambulatory Visit (HOSPITAL_BASED_OUTPATIENT_CLINIC_OR_DEPARTMENT_OTHER)
Admission: EM | Admit: 2023-11-29 | Discharge: 2023-11-29 | Disposition: A | Discharge status: Home / Self Care | Attending: Family Medicine | Admitting: Family Medicine

## 2023-11-29 DIAGNOSIS — N390 Urinary tract infection, site not specified: Secondary | ICD-10-CM | POA: Insufficient documentation

## 2023-11-29 DIAGNOSIS — R319 Hematuria, unspecified: Secondary | ICD-10-CM | POA: Diagnosis present

## 2023-11-29 DIAGNOSIS — R300 Dysuria: Secondary | ICD-10-CM

## 2023-11-29 LAB — POCT URINALYSIS DIP (MANUAL ENTRY)
Bilirubin, UA: NEGATIVE
Glucose, UA: NEGATIVE mg/dL
Ketones, POC UA: NEGATIVE mg/dL
Nitrite, UA: NEGATIVE
Protein Ur, POC: 30 mg/dL — AB
Spec Grav, UA: 1.02 (ref 1.010–1.025)
Urobilinogen, UA: 0.2 U/dL
pH, UA: 5.5 (ref 5.0–8.0)

## 2023-11-29 MED ORDER — CIPROFLOXACIN HCL 500 MG PO TABS: 500.0000 mg | ORAL_TABLET | Freq: Two times a day (BID) | ORAL | 0 refills | Status: AC

## 2023-11-29 NOTE — ED Triage Notes (Signed)
 Pt c/o dysuira, hematuria, cloudy urine, urinary frequency since Friday 11/26/23. He  has taken azo with some relief. Denies fever.

## 2023-11-29 NOTE — Discharge Instructions (Signed)
 Treating you for a urinary tract infection.  Take the antibiotics as prescribed.  Make sure to drink plenty of water . We will culture the urine and call with any changes if needed.

## 2023-11-29 NOTE — ED Provider Notes (Signed)
 [facility]    CSN: [id number] Arrival date & time: 11/29/23  1142      History   Chief Complaint Chief Complaint  Patient presents with   Dysuria    HPI Brian Brock is a 78 y.o. male.   Pt is a 78 year old male that presents with  dysuira, hematuria, cloudy urine, urinary frequency since Friday 11/26/23. He  has taken azo with some relief. Denies fever, back pain, flank pain or any rectal symptoms.    Dysuria Presenting symptoms: dysuria     Past Medical History:  Diagnosis Date   AF (atrial fibrillation) (HCC)    happened after CABG, has not had it since   Allergic rhinitis    Allergy    Anginal pain (HCC) 1997   Arthritis    lower back (09/26/2015)   Childhood asthma    Chronic bronchitis (HCC)    Yearly   COPD (chronic obstructive pulmonary disease) (HCC)    Coronary artery disease    Multiple stents   Dyslipidemia    Dysrhythmia    Femoral hernia of right side 12/28/2016   GERD (gastroesophageal reflux disease)    History of hiatal hernia    Hyperlipemia    Hypertension    Hypothyroidism    Irritable bowel syndrome    Myocardial infarct (HCC) 1997   mild   PE (pulmonary thromboembolism) (HCC) 05/2018   Peripheral vascular disease (HCC)    prior stenting   Pre-diabetes    Restless legs     Patient Active Problem List   Diagnosis Date Noted   CAP (community acquired pneumonia) 07/03/2021   Seasonal and perennial allergic rhinitis 03/26/2020   Nocturnal hypoxemia 03/26/2020   Lower extremity myoclonus 01/05/2020   PAD (peripheral artery disease) (HCC) 01/05/2020   Blood clotting disorder (HCC) 12/13/2019   Snorings 12/13/2019   Paroxysmal atrial fibrillation (HCC) 12/13/2019   CAD, multiple vessel 12/13/2019   Non-restorative sleep 12/13/2019   Leg edema, right 05/24/2018   COPD mixed type (HCC) 05/24/2018   Pulmonary embolism (HCC) 05/24/2018   DVT of lower limb, acute (HCC) 05/24/2018   Femoral hernia of right side  12/28/2016   Obstructive chronic bronchitis without exacerbation (HCC) 03/09/2015   Hyperlipidemia LDL goal <70 02/15/2014   Palpitations 02/15/2014   Metabolic syndrome 05/29/2013   Peripheral vascular disease (HCC) 05/29/2013   Essential hypertension 04/15/2013   Bradycardia 11/04/2012   Dizziness 11/04/2012   PAF (paroxysmal atrial fibrillation) (HCC) 11/04/2012   Elevated LFTs 09/06/2012   PVD- multiple LE interventions 09/06/2012   CAD- CABG X 17 August 2012 09/05/2012   DYSPNEA 06/26/2009    Past Surgical History:  Procedure Laterality Date   CARDIAC CATHETERIZATION  2004   had remote tandem stents in 1997 and 1998 for LAD lesions, RCA stent in 1997 , midprto=ion, and a proximal RCA  SENT IN 1999, all DES; some were IVIS  guide.   CARDIAC CATHETERIZATION  09/07/2012   demonstrated severe 2- vessel disease,patent LAD,patient circ and patent right stents, but 90% ostial LAD whic was new.   CAROTID DOPPLER  100/24/11   RGT BULB and PROXIMAL ICA 0-49%; LFT. ICA  0-49 ; RGT SUBCLAVIAN ARTERY < 50% ; LFT SUBCLAVIAN normal patency; RGT  and LFT  VERTEBRAL ARTERIES   COLONOSCOPY  11/06/2016   Diverticulosis of colon. Internal hemorrhoids.    CORONARY ANGIOPLASTY     CORONARY ANGIOPLASTY WITH STENT PLACEMENT  '97, '98, '04, '06, '08, '10   seven cardiac  stents   CORONARY ARTERY BYPASS GRAFT N/A 09/12/2012   Procedure: OFF PUMP CORONARY ARTERY BYPASS GRAFTING (CABG);  Surgeon: Elspeth JAYSON Millers, MD;  Location: [facility] OR;  Service: Open Heart Surgery;  Laterality: N/A;  Times two using left internal mammary artery and endoscopically harvested right saphenous vein  ;LIMA-LAD and SVG -OM1   DOPPLER ECHOCARDIOGRAPHY  11/23/2012,09/30/2010    LV EF 50-55%----09/30/2010-LV EF = >55%   ENDARTERECTOMY FEMORAL Right 12/19/2015   Procedure: ENDARTERECTOMY RIGHT FEMORAL ARTERY;  Surgeon: Gaile LELON New, MD;  Location: [facility] OR;  Service: Vascular;  Laterality: Right;   event monitor  11/04/2012-11/17/2012    pt denies this hx on 07/24/2014   EYE SURGERY Bilateral    cataract   FEMORAL ARTERY STENT Bilateral 7/06  8/08   Lt SFA '06, Rt SFA 8/08   HERNIA REPAIR  10/2008   repaired w/gallbladder OR   HERNIA REPAIR  07/2016   INGUINAL HERNIA REPAIR Right 12/28/2016   Procedure: OPEN REPAIR RIGHT GROIN HERNIA WITH MESH;  Surgeon: Gail Favorite, MD;  Location: [facility] OR;  Service: General;  Laterality: Right;   INGUINAL HERNIA REPAIR Right 02/21/2020   Procedure: LAPAROSCOPIC RIGHT INGUINAL HERNIA REPAIR WITH MESH;  Surgeon: Rubin Calamity, MD;  Location: [facility] OR;  Service: General;  Laterality: Right;   LAPAROSCOPIC CHOLECYSTECTOMY  June 2010   LEFT HEART CATHETERIZATION WITH CORONARY ANGIOGRAM N/A 09/07/2012   Procedure: LEFT HEART CATHETERIZATION WITH CORONARY ANGIOGRAM;  Surgeon: Debby DELENA Sor, MD;  Location: [facility] CATH LAB;  Service: Cardiovascular;  Laterality: N/A;   LEFT HEART CATHETERIZATION WITH CORONARY/GRAFT ANGIOGRAM N/A 07/24/2014   Procedure: LEFT HEART CATHETERIZATION WITH CORONARY/GRAFT ANGIOGRAM;  Surgeon: Debby DELENA Sor, MD; LAD 70%, 50% ISR, mLAD 99%, CFX 30%, patent stent, RCA patent, LIMA-LAD occluded, SVG-OM 1 patent, EF 50-55%   LEXISCAN  STRESS TEST  09/06/2012   LOWER ARTERIAL DOPPLER  03/25/2012   FGT CIA 0-49%; RGT CFA AND SFA AT STENT 0-49%; RGT DISTAL SFA = <50%; LFT. EIA  50-69%; LFT. CFA 0-49% ;;LFT. SFA  AT STENT NORMAL PATENCY   LOWER EXTREMITY ANGIOGRAPHY N/A 09/05/2019   Procedure: LOWER EXTREMITY ANGIOGRAPHY;  Surgeon: Ladona Heinz, MD;  Location: [facility] INVASIVE CV LAB;  Service: Cardiovascular;  Laterality: N/A;   NM MYOVIEW  LTD  05/06/2012   EF -STUDY NOT GATED, NORMAL STRESS NUCLEAR ,NORMAL LV FUNCTION   PATCH ANGIOPLASTY Right 12/19/2015   Procedure: PATCH ANGIOPLASTY RIGHT FEMORAL ARTERY;  Surgeon: Gaile LELON New, MD;  Location: [facility] OR;  Service: Vascular;  Laterality: Right;   PERCUTANEOUS CORONARY STENT INTERVENTION (PCI-S) Right 07/24/2014   Procedure: PERCUTANEOUS CORONARY  STENT INTERVENTION (PCI-S);  Surgeon: Debby DELENA Sor, MD; ostial LAD PTCA with angioscope scoring balloon, 3.0 x 34 mm Resolute DES extending from the ostium to the mid LAD, covering all significant stenoses    PERIPHERAL INTRAVASCULAR LITHOTRIPSY  09/05/2019   Procedure: INTRAVASCULAR LITHOTRIPSY;  Surgeon: Ladona Heinz, MD;  Location: [facility] INVASIVE CV LAB;  Service: Cardiovascular;;  left common femoral   PERIPHERAL VASCULAR CATHETERIZATION N/A 09/26/2015   Procedure: Abdominal Aortogram w/Lower Extremity;  Surgeon: Dorn JINNY Lesches, MD;  Location: [facility] INVASIVE CV LAB;  Service: Cardiovascular;  Laterality: N/A;   PERIPHERAL VASCULAR CATHETERIZATION Right 02/20/2016   Procedure: Peripheral Vascular Atherectomy;  Surgeon: Dorn JINNY Lesches, MD;  Location: [facility] INVASIVE CV LAB;  Service: Cardiovascular;  Laterality: Right;  SFA   PERIPHERAL VASCULAR CATHETERIZATION Right 02/20/2016   Procedure: Peripheral Vascular Balloon Angioplasty;  Surgeon: Dorn JINNY Lesches, MD;  Location:  [facility] INVASIVE CV LAB;  Service: Cardiovascular;  Laterality: Right;  SFA   PERIPHERAL VASCULAR INTERVENTION  09/05/2019   Procedure: PERIPHERAL VASCULAR INTERVENTION;  Surgeon: Ladona Heinz, MD;  Location: [facility] INVASIVE CV LAB;  Service: Cardiovascular;;  left common iliac   TONSILLECTOMY  1950's   UPPER GASTROINTESTINAL ENDOSCOPY         Home Medications    Prior to Admission medications   Medication Sig Start Date End Date Taking? Authorizing Provider  ciprofloxacin  (CIPRO ) 500 MG tablet Take 1 tablet (500 mg total) by mouth 2 (two) times daily for 7 days. 11/29/23 12/06/23 Yes Takashi Korol A, FNP  acetaminophen  (TYLENOL ) 500 MG tablet Take 500-1,000 mg by mouth every 6 (six) hours as needed for moderate pain.     [provider]  albuterol  (PROVENTIL ) (2.5 MG/3ML) 0.083% nebulizer solution Use 3mL by nebulization every 6 hours and as needed and for wheezing or shortness of breath. Patient not taking: Reported on 09/07/2023  02/24/21   Neysa Rama D, MD  amLODipine  (NORVASC ) 5 MG tablet TAKE 1 TABLET ONCE DAILY 10/26/22   Ladona Heinz, MD  Ascorbic Acid (VITAMIN C) 1000 MG tablet Take 1,000 mg by mouth daily.    [provider]  budesonide  (PULMICORT ) 0.5 MG/2ML nebulizer solution 1 neb twice daily- rinse mouth Patient not taking: Reported on 09/07/2023 04/13/22   Neysa Rama BIRCH, MD  cetirizine (ZYRTEC) 10 MG tablet Take 10 mg by mouth daily.    [provider]  cholecalciferol (VITAMIN D3) 25 MCG (1000 UNIT) tablet Take 1,000 Units by mouth daily.    [provider]  finasteride (PROSCAR) 5 MG tablet Take 1 tablet by mouth daily. 05/26/22   [provider]  levothyroxine  (SYNTHROID , LEVOTHROID) 25 MCG tablet Take 25 mcg by mouth daily before breakfast.  07/31/14   [provider]  loperamide (IMODIUM A-D) 2 MG tablet Take 2 mg by mouth 4 (four) times daily as needed for diarrhea or loose stools.    [provider]  Melatonin 12 MG TABS Take 12 mg by mouth at bedtime.     [provider]  nitroGLYCERIN  (NITROSTAT ) 0.4 MG SL tablet Place 1 tablet (0.4 mg total) under the tongue every 5 (five) minutes as needed for chest pain. 08/06/21   Ladona Heinz, MD  OXYGEN  Inhale 2 L into the lungs at bedtime.    [provider]  pantoprazole  (PROTONIX ) 40 MG tablet Take 1 tablet (40 mg total) by mouth 2 (two) times daily. Patient taking differently: Take 40 mg by mouth daily. 11/03/17   Esterwood, Amy S, PA-C  propranolol  (INDERAL ) 10 MG tablet TAKE ONE TABLET BY MOUTH 2 TIMES A DAY 08/26/22   Ladona Heinz, MD  rivaroxaban  (XARELTO ) 20 MG TABS tablet Take 1 tablet (20 mg total) by mouth daily with supper. 12/16/20   Franchot Lauraine HERO, NP  rosuvastatin  (CRESTOR ) 20 MG tablet TAKE ONE TABLET BY MOUTH EVERY DAY Patient taking differently: Take 10 mg by mouth daily. 11/17/16   Burnard Debby LABOR, MD  tamsulosin (FLOMAX) 0.4 MG CAPS capsule Take 0.4 mg by mouth daily.    [provider]  Turmeric (QC TUMERIC COMPLEX PO) Take 1,000 mg by mouth daily.    [provider]  zolpidem  (AMBIEN ) 10 MG tablet Take 10 mg by mouth at bedtime as needed for sleep.  07/05/17   [provider]    Family History Family History  Problem Relation Age of Onset   Coronary artery  disease Father 26   Heart attack Father    Coronary artery disease Brother 42       CABG in 2000   Deep vein thrombosis Brother    Colon cancer Neg Hx    Esophageal cancer Neg Hx    Rectal cancer Neg Hx    Stomach cancer Neg Hx     Social History Social History   Tobacco Use   Smoking status: Former    Current packs/day: 0.00    Average packs/day: 0.5 packs/day for 56.0 years (28.0 ttl pk-yrs)    Types: Cigarettes    Start date: 08/23/1964    Quit date: 08/23/2020    Years since quitting: 3.2   Smokeless tobacco: Former    Types: Chew    Quit date: 05/21/2021  Vaping Use   Vaping status: Never Used  Substance Use Topics   Alcohol use: Yes    Alcohol/week: 1.0 standard drink of alcohol    Types: 1 Standard drinks or equivalent per week    Comment: 09/26/2015 varies; might have a few drinks q 2 months   Drug use: No     Allergies   Other, Spironolactone , Clarithromycin, Fenofibrate micronized, Metoprolol , Sulfur hexafluoride, Tricor [fenofibrate], Fish oil, Meloxicam, Metaxalone, Niaspan [niacin], Sulfa antibiotics, and Sulfadiazine   Review of Systems Review of Systems  Genitourinary:  Positive for dysuria.  See HPI   Physical Exam Triage Vital Signs ED Triage Vitals  Encounter Vitals Group     BP 11/29/23 1152 124/67     Girls Systolic BP Percentile --      Girls Diastolic BP Percentile --      Boys Systolic BP Percentile --      Boys Diastolic BP Percentile --      Pulse Rate 11/29/23 1152 71     Resp 11/29/23 1152 20     Temp 11/29/23 1152 98.4 F (36.9 C)     Temp Source 11/29/23 1152 Oral     SpO2 11/29/23 1152 94 %     Weight --      Height --       Head Circumference --      Peak Flow --      Pain Score 11/29/23 1150 1     Pain Loc --      Pain Education --      Exclude from Growth Chart --    No data found.  Updated Vital Signs BP 124/67 (BP Location: Right Arm)   Pulse 71   Temp 98.4 F (36.9 C) (Oral)   Resp 20   SpO2 94%   Visual Acuity Right Eye Distance:   Left Eye Distance:   Bilateral Distance:    Right Eye Near:   Left Eye Near:    Bilateral Near:     Physical Exam Constitutional:      Appearance: Normal appearance.  Pulmonary:     Effort: Pulmonary effort is normal.  Musculoskeletal:        General: Normal range of motion.  Neurological:     Mental Status: He is alert.  Psychiatric:        Mood and Affect: Mood normal.      UC Treatments / Results  Labs (all labs ordered are listed, but only abnormal results are displayed) Labs Reviewed  POCT URINALYSIS DIP (MANUAL ENTRY) - Abnormal; Notable for the following components:      Result Value   Clarity, UA cloudy (*)    Blood, UA moderate (*)  Protein Ur, POC =30 (*)    Leukocytes, UA Moderate (2+) (*)    All other components within normal limits  URINE CULTURE    EKG   Radiology No results found.  Procedures Procedures (including critical care time)  Medications Ordered in UC Medications - No data to display  Initial Impression / Assessment and Plan / UC Course  I have reviewed the triage vital signs and the nursing notes.  Pertinent labs & imaging results that were available during my care of the patient were reviewed by me and considered in my medical decision making (see chart for details).     Dysuria-urine with moderate leuks, protein, moderate blood and cloudy.  Consistent with urinary tract infection.  Will go ahead and treat with antibiotics at this time.  Recommend make sure you are drinking plenty of fluids and staying hydrated.  We will send for culture and call with any changes if needed. Final Clinical  Impressions(s) / UC Diagnoses   Final diagnoses:  Dysuria     Discharge Instructions      Treating you for a urinary tract infection.  Take the antibiotics as prescribed.  Make sure to drink plenty of water . We will culture the urine and call with any changes if needed.     ED Prescriptions     Medication Sig Dispense Auth. Provider   ciprofloxacin  (CIPRO ) 500 MG tablet Take 1 tablet (500 mg total) by mouth 2 (two) times daily for 7 days. 14 tablet Adah Wilbert LABOR, FNP      PDMP not reviewed this encounter.   Adah Wilbert LABOR, FNP 11/29/23 1232

## 2023-12-01 LAB — URINE CULTURE: Culture: >=100000 — AB

## 2023-12-02 ENCOUNTER — Ambulatory Visit (HOSPITAL_COMMUNITY): Payer: Self-pay

## 2023-12-02 ENCOUNTER — Telehealth (HOSPITAL_COMMUNITY): Payer: Self-pay | Admitting: *Deleted

## 2023-12-02 MED ORDER — NITROFURANTOIN MONOHYD MACRO 100 MG PO CAPS: 100.0000 mg | ORAL_CAPSULE | Freq: Two times a day (BID) | ORAL | 0 refills | Status: DC

## 2023-12-02 MED ORDER — NITROFURANTOIN MONOHYD MACRO 100 MG PO CAPS: 100.0000 mg | ORAL_CAPSULE | Freq: Two times a day (BID) | ORAL | 0 refills | Status: AC

## 2023-12-02 NOTE — Telephone Encounter (Signed)
 Pt wants rx for macrobid  to [facility] [place]

## 2023-12-15 ENCOUNTER — Inpatient Hospital Stay: Payer: BLUE CROSS/BLUE SHIELD | Attending: Hematology & Oncology

## 2023-12-15 ENCOUNTER — Encounter: Payer: Self-pay | Admitting: Medical Oncology

## 2023-12-15 ENCOUNTER — Encounter: Payer: Self-pay | Admitting: *Deleted

## 2023-12-15 ENCOUNTER — Inpatient Hospital Stay: Payer: BLUE CROSS/BLUE SHIELD | Admitting: Medical Oncology

## 2023-12-15 VITALS — BP 119/51 | HR 49 | Temp 98.1°F | Resp 18 | Ht 69.0 in | Wt 186.8 lb

## 2023-12-15 DIAGNOSIS — I824Y1 Acute embolism and thrombosis of unspecified deep veins of right proximal lower extremity: Secondary | ICD-10-CM

## 2023-12-15 DIAGNOSIS — Z7901 Long term (current) use of anticoagulants: Secondary | ICD-10-CM

## 2023-12-15 DIAGNOSIS — Z86718 Personal history of other venous thrombosis and embolism: Secondary | ICD-10-CM | POA: Diagnosis not present

## 2023-12-15 DIAGNOSIS — I2699 Other pulmonary embolism without acute cor pulmonale: Secondary | ICD-10-CM

## 2023-12-15 LAB — CBC WITH DIFFERENTIAL (CANCER CENTER ONLY)
Abs Immature Granulocytes: 0.02 K/uL (ref 0.00–0.07)
Basophils Absolute: 0.1 K/uL (ref 0.0–0.1)
Basophils Relative: 1 %
Eosinophils Absolute: 0.1 K/uL (ref 0.0–0.5)
Eosinophils Relative: 2 %
HCT: 41.5 % (ref 39.0–52.0)
Hemoglobin: 13.7 g/dL (ref 13.0–17.0)
Immature Granulocytes: 0 %
Lymphocytes Relative: 20 %
Lymphs Abs: 1.6 K/uL (ref 0.7–4.0)
MCH: 28.4 pg (ref 26.0–34.0)
MCHC: 33 g/dL (ref 30.0–36.0)
MCV: 85.9 fL (ref 80.0–100.0)
Monocytes Absolute: 0.6 K/uL (ref 0.1–1.0)
Monocytes Relative: 8 %
Neutro Abs: 5.7 K/uL (ref 1.7–7.7)
Neutrophils Relative %: 69 %
Platelet Count: 225 K/uL (ref 150–400)
RBC: 4.83 MIL/uL (ref 4.22–5.81)
RDW: 13.2 % (ref 11.5–15.5)
WBC Count: 8.1 K/uL (ref 4.0–10.5)
nRBC: 0 % (ref 0.0–0.2)

## 2023-12-15 LAB — CMP (CANCER CENTER ONLY)
ALT: 16 U/L (ref 0–44)
AST: 22 U/L (ref 15–41)
Albumin: 4.1 g/dL (ref 3.5–5.0)
Alkaline Phosphatase: 78 U/L (ref 38–126)
Anion gap: 11 (ref 5–15)
BUN: 22 mg/dL (ref 8–23)
CO2: 23 mmol/L (ref 22–32)
Calcium: 9.2 mg/dL (ref 8.9–10.3)
Chloride: 108 mmol/L (ref 98–111)
Creatinine: 1.27 mg/dL — ABNORMAL HIGH (ref 0.61–1.24)
GFR, Estimated: 58 mL/min — ABNORMAL LOW (ref >60)
Glucose, Bld: 133 mg/dL — ABNORMAL HIGH (ref 70–99)
Potassium: 4.9 mmol/L (ref 3.5–5.1)
Sodium: 143 mmol/L (ref 135–145)
Total Bilirubin: 0.7 mg/dL (ref 0.0–1.2)
Total Protein: 6.7 g/dL (ref 6.5–8.1)

## 2023-12-15 NOTE — Progress Notes (Signed)
 Hematology and Oncology Follow Up Visit  Brian Brock [id number] 12/28/1945 78 y.o. 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   He is doing well and has no complaints at this time.  He just celebrated his 55th birthday.  No fever, chills, n/v, cough, rash, dizziness, SOB, chest pain, palpitations, abdominal pain or changes in bowel or bladder habits.  He has chronic constipation and takes Miralax as needed.  No blood loss noted. No abnormal bruising, no petechiae.  No swelling, numbness or tingling in his extremities.  No falls or syncope.  Appetite and hydration are good. Wt Readings from Last 3 Encounters:  12/15/23 186 lb 12.8 oz (84.7 kg)  09/07/23 191 lb 9.6 oz (86.9 kg)  06/16/23 186 lb 12.8 oz (84.7 kg)     ECOG Performance Status: 0 - Asymptomatic  Medications:  Allergies as of 12/15/2023       Reactions   Other Anaphylaxis, Shortness Of Breath, Swelling   [place] nuts- will kill me   Spironolactone  Other (See Comments)   Mastodynia   Clarithromycin Nausea Only   Fenofibrate Micronized Other (See Comments)   Caused weakness/lethargy   Metoprolol  Other (See Comments)   Fatigue and malaise   Sulfur Hexafluoride Hives   Tricor [fenofibrate] Other (See Comments)   Tired/weak   Fish Oil Rash   Meloxicam Other (See Comments)   Achy, fatigue   Metaxalone Other (See Comments)   Doesn't remember   Niaspan [niacin] Other (See Comments)   Doesn't remember   Sulfa Antibiotics Other (See Comments)   Other reaction(s): Unknown   Sulfadiazine Hives, Rash        Medication List         Accurate as of December 15, 2023  2:37 PM. If you have any questions, ask your nurse or doctor.          acetaminophen  500 MG tablet Commonly known as: TYLENOL  Take 500-1,000 mg by mouth every 6 (six) hours as needed for moderate pain.   albuterol  (2.5 MG/3ML) 0.083% nebulizer solution Commonly known as: PROVENTIL  Use 3mL by nebulization every 6 hours and as needed and for wheezing or shortness of breath.   amLODipine  5 MG tablet Commonly known as: NORVASC  TAKE 1 TABLET ONCE DAILY   budesonide  0.5 MG/2ML nebulizer solution Commonly known as: Pulmicort  1 neb twice daily- rinse mouth   cetirizine 10 MG tablet Commonly known as: ZYRTEC Take 10 mg by mouth daily.   cholecalciferol 25 MCG (1000 UNIT) tablet Commonly known as: VITAMIN D3 Take 1,000 Units by mouth daily.   finasteride 5 MG tablet Commonly known as: PROSCAR Take 1 tablet by mouth daily.   levothyroxine  25 MCG tablet Commonly known as: SYNTHROID  Take 25 mcg by mouth daily before breakfast.   loperamide 2 MG tablet Commonly known as: IMODIUM A-D Take 2 mg by mouth 4 (four) times daily as needed for diarrhea or loose stools.   Melatonin 12 MG Tabs Take 12 mg by mouth at bedtime.   nitroGLYCERIN  0.4 MG SL tablet Commonly known as: NITROSTAT  Place 1 tablet (0.4 mg total) under  the tongue every 5 (five) minutes as needed for chest pain.   OXYGEN  Inhale 2 L into the lungs at bedtime.   pantoprazole  40 MG tablet Commonly known as: PROTONIX  Take 1 tablet (40 mg total) by mouth 2 (two) times daily.   propranolol  10 MG tablet Commonly known as: INDERAL  TAKE ONE TABLET BY MOUTH 2 TIMES A DAY   QC TUMERIC COMPLEX PO Take 1,000 mg by mouth daily.   rivaroxaban  20 MG Tabs tablet Commonly known as: XARELTO  Take 1 tablet (20 mg total) by mouth daily with supper.   rosuvastatin  20 MG tablet Commonly known as: CRESTOR  TAKE ONE TABLET BY MOUTH EVERY DAY   tamsulosin 0.4 MG Caps capsule Commonly known as:  FLOMAX Take 0.4 mg by mouth daily.   vitamin C 1000 MG tablet Take 1,000 mg by mouth daily.   zolpidem  10 MG tablet Commonly known as: AMBIEN  Take 10 mg by mouth at bedtime as needed for sleep.        Allergies:  Allergies  Allergen Reactions   Other Anaphylaxis, Shortness Of Breath and Swelling    [place] nuts- will kill me   Spironolactone  Other (See Comments)    Mastodynia   Clarithromycin Nausea Only   Fenofibrate Micronized Other (See Comments)    Caused weakness/lethargy   Metoprolol  Other (See Comments)    Fatigue and malaise   Sulfur Hexafluoride Hives   Tricor [Fenofibrate] Other (See Comments)    Tired/weak   Fish Oil Rash   Meloxicam Other (See Comments)    Achy, fatigue   Metaxalone Other (See Comments)    Doesn't remember   Niaspan [Niacin] Other (See Comments)    Doesn't remember   Sulfa Antibiotics Other (See Comments)    Other reaction(s): Unknown   Sulfadiazine Hives and Rash    Past Medical History, Surgical history, Social history, and Family History were reviewed and updated.  Review of Systems: All other 10 point review of systems is negative.   Physical Exam:  height is 5' 9 (1.753 m) and weight is 186 lb 12.8 oz (84.7 kg). His oral temperature is 98.1 F (36.7 C). His blood pressure is 119/51 (abnormal) and his pulse is 49 (abnormal). His respiration is 18 and oxygen  saturation is 98%.   Wt Readings from Last 3 Encounters:  12/15/23 186 lb 12.8 oz (84.7 kg)  09/07/23 191 lb 9.6 oz (86.9 kg)  06/16/23 186 lb 12.8 oz (84.7 kg)    Ocular: Sclerae unicteric, pupils equal, round and reactive to light Ear-nose-throat: Oropharynx clear, dentition fair Lymphatic: No cervical or supraclavicular adenopathy Lungs no rales or rhonchi, good excursion bilaterally Heart regular rate and rhythm, no murmur appreciated Abd soft, nontender, positive bowel sounds MSK no focal spinal tenderness, no joint edema Neuro: non-focal, well-oriented,  appropriate affect Breasts: Deferred   Lab Results  Component Value Date   WBC 8.1 12/15/2023   HGB 13.7 12/15/2023   HCT 41.5 12/15/2023   MCV 85.9 12/15/2023   PLT 225 12/15/2023   Lab Results  Component Value Date   FERRITIN 135.1 11/03/2017   IRON 102 11/03/2017   IRONPCTSAT 27.5 11/03/2017   Lab Results  Component Value Date   RBC 4.83 12/15/2023   No results found for: KPAFRELGTCHN, LAMBDASER, KAPLAMBRATIO No results found for: IGGSERUM, IGA, IGMSERUM No results found for: TOTALPROTELP, ALBUMINELP, A1GS, A2GS, BETS, BETA2SER, GAMS, MSPIKE, SPEI   Chemistry      Component Value Date/Time   NA 143 12/15/2023 1356   NA  142 08/04/2021 1113   K 4.9 12/15/2023 1356   CL 108 12/15/2023 1356   CO2 23 12/15/2023 1356   BUN 22 12/15/2023 1356   BUN 21 08/04/2021 1113   CREATININE 1.27 (H) 12/15/2023 1356   CREATININE 1.05 02/12/2016 1008      Component Value Date/Time   CALCIUM  9.2 12/15/2023 1356   ALKPHOS 78 12/15/2023 1356   AST 22 12/15/2023 1356   ALT 16 12/15/2023 1356   BILITOT 0.7 12/15/2023 1356     Encounter Diagnoses  Name Primary?   Current use of long term anticoagulation Yes   History of DVT (deep vein thrombosis)    Personal history of venous thrombosis and embolism     Impression and Plan: Mr. 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  CBC looks great CMP pending He will continue his same regimen. No changes.   He will continue follow up with cardiology regarding his hypotension and bradycardia.    RTC 6 months APP, labs (CBC, CMP)  Lauraine CHRISTELLA Dais, PA-C 7/30/20252:37 PM

## 2024-02-08 ENCOUNTER — Telehealth (HOSPITAL_BASED_OUTPATIENT_CLINIC_OR_DEPARTMENT_OTHER): Payer: Self-pay

## 2024-02-08 NOTE — Telephone Encounter (Signed)
   Pre-operative Risk Assessment    Patient Name: Brian Brock  DOB: 1946-04-02 MRN: [id number]   Date of last office visit: 06/15/2023 [name], MD) Date of next office visit: None   Request for Surgical Clearance    Procedure:  UroLift  Date of Surgery:  Clearance TBD                                Surgeon:  Dr. Gwenith Surgeon's Group or Practice Name:  [facility] Phone number:  [phone] Fax number:  [phone]   Type of Clearance Requested:   - Medical  - Pharmacy:  Hold Rivaroxaban  (Xarelto ) 3 days prior    Type of Anesthesia:  Not Indicated   Additional requests/questions:    [name] [name]   02/08/2024, 11:52 AM

## 2024-02-08 NOTE — Telephone Encounter (Signed)
 Preop OV appt now scheduled

## 2024-02-08 NOTE — Telephone Encounter (Signed)
   Name: Brian Brock  DOB: 12-Sep-1945  MRN: [id number]  Primary Cardiologist: Gordy Bergamo, MD  Chart reviewed as part of pre-operative protocol coverage. Patient was last seen by Dr. Ganii in 05/2023 at which time he reported occasional chest pain. He has not had an EKG in over 1 year (11/2022). Given his past medical history, time since last visit and the symptoms he was having at last visit, he will require a follow-up in-office visit in order to better assess preoperative cardiovascular risk.  Pre-op covering staff: - Please schedule appointment and call patient to inform them. If patient already had an upcoming appointment within acceptable timeframe, please add pre-op clearance to the appointment notes so provider is aware. - Please contact requesting surgeon's office via preferred method (i.e, phone, fax) to inform them of need for appointment prior to surgery.  Patient is on Xarelto  for prior DVT/ PE. Therefore, will defer recommendations for holding this to PCP.  [name], PA-C  02/08/2024, 2:09 PM

## 2024-02-10 ENCOUNTER — Encounter: Payer: Self-pay | Admitting: Physician Assistant

## 2024-02-10 NOTE — Telephone Encounter (Signed)
 Pt calling back to r/s preop appt

## 2024-02-11 ENCOUNTER — Telehealth (HOSPITAL_BASED_OUTPATIENT_CLINIC_OR_DEPARTMENT_OTHER): Payer: Self-pay | Admitting: *Deleted

## 2024-02-11 ENCOUNTER — Encounter: Payer: Self-pay | Admitting: Cardiology

## 2024-02-11 NOTE — Telephone Encounter (Signed)
 I s/w the pt to reschedule his appt. He said someone called him yesterday and rescheduled the appt. I apologized as the notes were still in my preop box to call him. Pt said no worries. I did also confirm with him was he keeping his 03/2024 appt with Glendia Ferrier, PAC. Pt said that was supposed to be cancelled. I assured the pt that I will take care of that for him and cancel the 03/2024 appt with Glendia Ferrier, PAC. Pt said thank you for the call and the help.     I will update all parties involved.

## 2024-02-11 NOTE — Telephone Encounter (Signed)
 Let him know I sent the  clearance letter to the surgeon today and I am fine with him going through the surgery. Letter can be found in the letter section of EPIC

## 2024-02-11 NOTE — Telephone Encounter (Signed)
 Pt has been made aware he has been cleared and Xarelto  recommendations have been reviewed with the pt with verbal understanding. Pt asked if he still needs the 02/21/24 appt. I assure the pt that I will ask Dr. Ladona and call him back.      Letter from Dr. Ladona: 02/11/2024  Re: Brian Brock DOB: December 25, 1945 MRN: [id number]  Address: [address] [place] [address]   Dear Charlie Auer, MD,  Brian Brock is at low risk, from a cardiac standpoint, for his upcoming procedure: UroLift .  It is ok to proceed without further cardiac testing.  If applicable can hold Xarelto  20 mg for 3  day(s) prior to procedure and re-start when surgically stable post procedure.  Please call Dept: [phone] with any additional questions.   Sincerely,     Gordy Ladona, MD, [facility] 02/11/2024, 1:24 PM [facility] [address] [place], [place] [address] Phone: [phone]. Fax:  [phone]  If no answer Cell [phone]

## 2024-02-11 NOTE — Telephone Encounter (Signed)
 Per Dr. Ladona 02/21/24 appt can be cancelled as he cleared the pt today, and clearance letter has been sent to the surgeon office. Dr. Ladona said to f/u 05/2024 as planned from 05/2023 ov notes for 1 yr f/u.

## 2024-02-21 ENCOUNTER — Ambulatory Visit: Admitting: Physician Assistant

## 2024-02-21 ENCOUNTER — Ambulatory Visit: Admitting: Cardiology

## 2024-04-05 ENCOUNTER — Ambulatory Visit: Admitting: Physician Assistant

## 2024-04-27 ENCOUNTER — Encounter: Payer: Self-pay | Admitting: Cardiology

## 2024-05-16 ENCOUNTER — Ambulatory Visit (HOSPITAL_BASED_OUTPATIENT_CLINIC_OR_DEPARTMENT_OTHER)

## 2024-06-13 ENCOUNTER — Encounter: Payer: Self-pay | Admitting: Cardiology

## 2024-06-13 ENCOUNTER — Other Ambulatory Visit (HOSPITAL_COMMUNITY): Payer: Self-pay

## 2024-06-13 ENCOUNTER — Other Ambulatory Visit: Payer: Self-pay | Admitting: Medical Oncology

## 2024-06-13 ENCOUNTER — Ambulatory Visit: Admitting: Cardiology

## 2024-06-13 VITALS — BP 116/62 | HR 73 | Ht 69.0 in | Wt 188.0 lb

## 2024-06-13 DIAGNOSIS — I739 Peripheral vascular disease, unspecified: Secondary | ICD-10-CM

## 2024-06-13 DIAGNOSIS — D6859 Other primary thrombophilia: Secondary | ICD-10-CM | POA: Diagnosis not present

## 2024-06-13 DIAGNOSIS — I10 Essential (primary) hypertension: Secondary | ICD-10-CM | POA: Diagnosis not present

## 2024-06-13 DIAGNOSIS — Z7901 Long term (current) use of anticoagulants: Secondary | ICD-10-CM

## 2024-06-13 DIAGNOSIS — Z86718 Personal history of other venous thrombosis and embolism: Secondary | ICD-10-CM

## 2024-06-13 DIAGNOSIS — I25118 Atherosclerotic heart disease of native coronary artery with other forms of angina pectoris: Secondary | ICD-10-CM | POA: Diagnosis not present

## 2024-06-13 DIAGNOSIS — E782 Mixed hyperlipidemia: Secondary | ICD-10-CM

## 2024-06-13 MED ORDER — EZETIMIBE 10 MG PO TABS
10.0000 mg | ORAL_TABLET | Freq: Every day | ORAL | 3 refills | Status: AC
  Filled 2024-06-13: qty 90, 90d supply, fill #0

## 2024-06-13 NOTE — Progress Notes (Signed)
 " Cardiology Office Note:  .   Date:  06/13/2024  ID:  Brian Brock, DOB Jun 28, 1945, MRN [id number] PCP: Silver Lamar DELENA, MD   [facility] Providers Cardiologist:  Gordy Bergamo, MD   History of Present Illness: .   Brian Brock is a 79 y.o.  Caucasian male with CAD with multiple angioplasties over the years, due to restenosis underwent CABG on 09/12/2012 but had recurrence of angina and LIMA occluded needing LAD stenting in 2016.  Past medical history significant for peripheral artery disease with left iliac artery and left SFA stenting in 2014 and drug-coated balloon angioplasty followed by [facility] on 09/05/2019 for left common femoral artery, right femoral patch angioplasty and endarterectomy in the remote past.   Other significant history includes hypertension, hyperglycemia, hyperlipidemia, hypothyroidism, spontaneous DVT and pulmonary embolism in January 2020 and has been on Xarelto  since then. He has chronic dyspnea on exertion and COPD with quit tobacco use in Apr 2022, nocturnal hypoxemia on home O2. He restarted smoking over the last three to four months after more than two years of abstinence and smokes for stress relief but is attempting to quit again.    Discussed the use of AI scribe software for clinical note transcription with the patient, who gave verbal consent to proceed.  History of Present Illness Brian Brock is a 79 year old male with hypertension and coronary artery disease who presents for follow-up of his cardiovascular health.  Approximately a week and a half ago his blood pressure rose to 204/92, which he associates with high-salt food the night before. He takes amlodipine  5 mg once daily and propranolol  twice daily for blood pressure control.  He has coronary artery disease with occasional stable angina. He takes rosuvastatin  20 mg daily. Recent labs showed LDL 75 with normal kidney and thyroid  function and mildly elevated triglycerides. He has reduced  sugar intake. He has a prior pulmonary embolism in 2020 and has been on Xarelto  since then. He also notes intermittent chest muscle spasms that improve with massage.  He restarted smoking over the last three to four months after more than two years of abstinence and smokes for stress relief but is attempting to quit again. He drinks bourbon and scotch whiskey.   Cardiac Studies relevent.     Coronary angiogram 07/24/2014:  High-grade stenosis to the proximal LAD, patent circumflex stent, patent SVG to OM1 however LIMA to LAD occluded (CABG on 09/12/2012 with off-pump LIMA to LAD and SVG to OM).   Successful stenting to proximal LAD with 3.0 x 34 mm resolute DES, postdilated to 3.4 mm.   Myocardial perfusion scan 08/07/2019: Mild inferior ischemia, mildly depressed LVEF at 49% with inferior hypokinesis. Intermediate restudy.  Echocardiogram 08/07/2019: Normal LVEF at 50 to 55%.  Severe LA dilatation.  Mild RA dilatation. Moderate tricuspid regurgitation, RVSP 33 mmHg.  Carotid artery duplex 02/17/2022:  No hemodynamically significant arterial disease in the internal carotid  artery bilaterally.  Antegrade left vertebral artery flow   EKG:   EKG Interpretation Date/Time:  Tuesday June 13 2024 10:29:39 EST Ventricular Rate:  73 PR Interval:  152 QRS Duration:  86 QT Interval:  380 QTC Calculation: 418 R Axis:   2  Text Interpretation: EKG 06/13/2024: Normal sinus rhythm at rate of 73 bpm, normal axis, no evidence of ischemia.  Occasional PACs.  Compared to 05/24/2018, frequent PVCs not present. Confirmed by Iman Orourke, Jagadeesh ([id number]) on 06/13/2024 10:36:25 AM  Labs   Lab Results  Component Value  Date   CHOL 138 06/22/2023   HDL 42 06/22/2023   LDLCALC 73 06/22/2023   TRIG 126 06/22/2023   CHOLHDL 2.9 02/07/2018   No results found for: LIPOA  Recent Labs    06/16/23 1323 12/15/23 1356  NA 142 143  K 5.0 4.9  CL 106 108  CO2 30 23  GLUCOSE 95 133*  BUN 17 22   CREATININE 1.20 1.27*  CALCIUM  9.2 9.2  GFRNONAA >60 58*    Lab Results  Component Value Date   ALT 16 12/15/2023   AST 22 12/15/2023   ALKPHOS 78 12/15/2023   BILITOT 0.7 12/15/2023      Latest Ref Rng & Units 12/15/2023    1:56 PM 06/16/2023    1:23 PM 12/16/2022   12:46 PM  CBC  WBC 4.0 - 10.5 K/uL 8.1  10.7  8.5   Hemoglobin 13.0 - 17.0 g/dL 86.2  85.7  86.6   Hematocrit 39.0 - 52.0 % 41.5  42.9  40.7   Platelets 150 - 400 K/uL 225  175  206    Lab Results  Component Value Date   HGBA1C 6.3 (H) 02/14/2020    Lab Results  Component Value Date   TSH 2.430 04/06/2019    Care everywhere/Faxed External Labs:  Labs 05/31/2024:  Total cholesterol 155, triglycerides 233, HDL 48, LDL 75.  Potassium 4.6, BUN 24, creatinine 1.19, eGFR 63 mL, LFTs normal.  TSH normal at 2.744.  B12 at the lower limit of normal at 274.  Vitamin D42.9.  ROS  Review of Systems  Cardiovascular:  Negative for chest pain, dyspnea on exertion and leg swelling.   Physical Exam:   VS:  BP 116/62 (BP Location: Right Arm, Patient Position: Sitting, Cuff Size: Normal)   Pulse 73   Ht 5' 9 (1.753 m)   Wt 188 lb (85.3 kg)   SpO2 95%   BMI 27.76 kg/m    Wt Readings from Last 3 Encounters:  06/13/24 188 lb (85.3 kg)  12/15/23 186 lb 12.8 oz (84.7 kg)  09/07/23 191 lb 9.6 oz (86.9 kg)    BP Readings from Last 3 Encounters:  06/13/24 116/62  12/15/23 (!) 119/51  11/29/23 124/67   Physical Exam Neck:     Vascular: No carotid bruit or JVD.  Cardiovascular:     Rate and Rhythm: Normal rate and regular rhythm.     Pulses: Intact distal pulses.          Dorsalis pedis pulses are 2+ on the right side and 2+ on the left side.       Posterior tibial pulses are 0 on the right side and 0 on the left side.     Heart sounds: Normal heart sounds. No murmur heard.    No gallop.  Pulmonary:     Effort: Pulmonary effort is normal.     Breath sounds: Normal breath sounds.  Abdominal:     General:  Bowel sounds are normal.     Palpations: Abdomen is soft.  Musculoskeletal:     Right lower leg: No edema.     Left lower leg: No edema.     ASSESSMENT AND PLAN: .      ICD-10-CM   1. Coronary artery disease of native artery of native heart with stable angina pectoris  I25.118 EKG 12-Lead    2. Primary hypertension  I10 EKG 12-Lead    3. Mixed hypercholesterolemia and hypertriglyceridemia  E78.2 EKG 12-Lead    ezetimibe  (ZETIA )  10 MG tablet    4. Peripheral artery disease  I73.9     5. Primary hypercoagulable state  D68.59      Assessment & Plan Coronary artery disease with stable angina No recent chest pain or changes in angina symptoms. Cardiac and vascular examination is unchanged. - Continue current medications: amlodipine  5 mg once daily, propranolol  1 tablet twice daily. - Again reiterated complete cessation of smoking and encouraged him and gave him positive reinforcement.  Primary hypertension Blood pressure is generally well-controlled with current medication regimen. - Continue current antihypertensive regimen.  Mixed hyperlipidemia LDL cholesterol is slightly elevated at 75 mg/dL. Goal is to reduce LDL to less than 70 mg/dL to decrease risk of heart attack or stroke. Current treatment with rosuvastatin  20 mg daily. - Added ezetimibe  to current regimen to further lower LDL cholesterol. - Educated on potential side effects of ezetimibe , including possible abdominal discomfort and gas, which typically resolve in 2-3 days.  Peripheral vascular disease Vascular examination is unchanged. No claudication symptoms reported. - Continue current management. - I reviewed his labs, his B12 is in the lower limit of normal, sent message on MyChart regarding B12 supplement.  Primary hypercoagulable state History of pulmonary embolism in 2020. Currently on Xarelto  for anticoagulation. - Continue Xarelto  as prescribed. - CBC reviewed, normal.  Follow up: 1 Year. CAD, Mixed  hyperchol, Tobacco use, PAD   Signed,  Gordy Bergamo, MD, [facility] 06/13/2024, 6:32 PM [facility] [address] [place], [place] [address] Phone: [phone]. Fax:  [phone]  "

## 2024-06-13 NOTE — Patient Instructions (Addendum)
 Medication Instructions:   START  ezetimibe  (ZETIA ) 10 MG tablet         Take 1 tablet (10 mg total) by mouth daily.    *If you need a refill on your cardiac medications before your next appointment, please call your pharmacy*  Lab Work: Lab Orders  No laboratory test(s) ordered today    If you have labs (blood work) drawn today and your tests are completely normal, you will receive your results only by: MyChart Message (if you have MyChart) OR A paper copy in the mail If you have any lab test that is abnormal or we need to change your treatment, we will call you to review the results.  Follow-Up: At [facility], you and your health needs are our priority.  As part of our continuing mission to provide you with exceptional heart care, our providers are all part of one team.  This team includes your primary Cardiologist (physician) and Advanced Practice Providers or APPs (Physician Assistants and Nurse Practitioners) who all work together to provide you with the care you need, when you need it.  Your next appointment:   1 year(s)  Provider:   Gordy Bergamo, MD       We recommend signing up for the patient portal called MyChart.  Patients are able to view lab/test results, encounter notes, upcoming appointments, etc.  Non-urgent messages can be sent to your provider as well, go to [email or web address].

## 2024-06-14 ENCOUNTER — Inpatient Hospital Stay: Attending: Hematology & Oncology

## 2024-06-14 ENCOUNTER — Encounter: Payer: Self-pay | Admitting: Medical Oncology

## 2024-06-14 ENCOUNTER — Inpatient Hospital Stay: Admitting: Medical Oncology

## 2024-06-14 VITALS — BP 152/58 | HR 73 | Temp 98.2°F | Resp 19 | Ht 69.0 in | Wt 184.0 lb

## 2024-06-14 DIAGNOSIS — Z7901 Long term (current) use of anticoagulants: Secondary | ICD-10-CM

## 2024-06-14 DIAGNOSIS — Z86718 Personal history of other venous thrombosis and embolism: Secondary | ICD-10-CM | POA: Diagnosis not present

## 2024-06-14 LAB — CMP (CANCER CENTER ONLY)
ALT: 33 U/L (ref 0–44)
AST: 25 U/L (ref 15–41)
Albumin: 4.4 g/dL (ref 3.5–5.0)
Alkaline Phosphatase: 76 U/L (ref 38–126)
Anion gap: 11 (ref 5–15)
BUN: 30 mg/dL — ABNORMAL HIGH (ref 8–23)
CO2: 25 mmol/L (ref 22–32)
Calcium: 9.4 mg/dL (ref 8.9–10.3)
Chloride: 104 mmol/L (ref 98–111)
Creatinine: 1.42 mg/dL — ABNORMAL HIGH (ref 0.61–1.24)
GFR, Estimated: 51 mL/min — ABNORMAL LOW
Glucose, Bld: 109 mg/dL — ABNORMAL HIGH (ref 70–99)
Potassium: 5.6 mmol/L — ABNORMAL HIGH (ref 3.5–5.1)
Sodium: 140 mmol/L (ref 135–145)
Total Bilirubin: 0.6 mg/dL (ref 0.0–1.2)
Total Protein: 7.1 g/dL (ref 6.5–8.1)

## 2024-06-14 LAB — CBC
HCT: 45.6 % (ref 39.0–52.0)
Hemoglobin: 15.1 g/dL (ref 13.0–17.0)
MCH: 29.1 pg (ref 26.0–34.0)
MCHC: 33.1 g/dL (ref 30.0–36.0)
MCV: 87.9 fL (ref 80.0–100.0)
Platelets: 215 10*3/uL (ref 150–400)
RBC: 5.19 MIL/uL (ref 4.22–5.81)
RDW: 14 % (ref 11.5–15.5)
WBC: 14.9 10*3/uL — ABNORMAL HIGH (ref 4.0–10.5)
nRBC: 0 % (ref 0.0–0.2)

## 2024-06-14 NOTE — Progress Notes (Signed)
 " Hematology and Oncology Follow Up Visit  Brian Brock [id number] [birth year] 79 y.o. 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   He say that he is doing ok. He is recovering from a viral illness and was recently on prednisone  and ABX. Slowly feeling better.  No fever, chills, n/v, cough, rash, dizziness, SOB, chest pain, palpitations, abdominal pain or changes in bowel or bladder habits.  He has chronic constipation and takes Miralax as needed.  No blood loss noted. No abnormal bruising, no petechiae.  No swelling, numbness or tingling in his extremities.  No falls or syncope.  Appetite and hydration are good. Wt Readings from Last 3 Encounters:  06/14/24 184 lb (83.5 kg)  06/13/24 188 lb (85.3 kg)  12/15/23 186 lb 12.8 oz (84.7 kg)   ECOG Performance Status: 0 - Asymptomatic  Medications:  Allergies as of 06/14/2024       Reactions   Other Anaphylaxis, Shortness Of Breath, Swelling   Brazil nuts- will kill me   Spironolactone  Other (See Comments)   Mastodynia   Clarithromycin Nausea Only   Fenofibrate Micronized Other (See Comments)   Caused weakness/lethargy   Metoprolol  Other (See Comments)   Fatigue and malaise   Sulfur Hexafluoride Hives   Tricor [fenofibrate] Other (See Comments)   Tired/weak   Fish Oil Rash   Meloxicam Other (See Comments)   Achy, fatigue   Metaxalone Other (See Comments)   Doesn't remember   Niaspan [niacin] Other (See Comments)   Doesn't remember   Sulfa Antibiotics Other (See Comments)   Other reaction(s): Unknown   Sulfadiazine Hives, Rash        Medication  List        Accurate as of June 14, 2024  2:42 PM. If you have any questions, ask your nurse or doctor.          acetaminophen  500 MG tablet Commonly known as: TYLENOL  Take 500-1,000 mg by mouth every 6 (six) hours as needed for moderate pain.   albuterol  (2.5 MG/3ML) 0.083% nebulizer solution Commonly known as: PROVENTIL  Use 3mL by nebulization every 6 hours and as needed and for wheezing or shortness of breath.   amLODipine  5 MG tablet Commonly known as: NORVASC  TAKE 1 TABLET ONCE DAILY   budesonide  0.5 MG/2ML nebulizer solution Commonly known as: Pulmicort  1 neb twice daily- rinse mouth   cetirizine 10 MG tablet Commonly known as: ZYRTEC Take 10 mg by mouth daily.   cholecalciferol 25 MCG (1000 UNIT) tablet Commonly known as: VITAMIN D3 Take 1,000 Units by mouth daily.   ezetimibe  10 MG tablet Commonly known as: ZETIA  Take 1 tablet (10 mg total) by mouth daily.   finasteride 5 MG tablet Commonly known as: PROSCAR Take 1 tablet by mouth daily.   levothyroxine  25 MCG tablet Commonly known as: SYNTHROID  Take 25 mcg by mouth daily before breakfast.   loperamide 2 MG tablet Commonly known as: IMODIUM A-D Take 2 mg by mouth 4 (four) times daily as needed for diarrhea or loose stools.   Melatonin 12 MG Tabs Take 12 mg by mouth  at bedtime.   nitroGLYCERIN  0.4 MG SL tablet Commonly known as: NITROSTAT  Place 1 tablet (0.4 mg total) under the tongue every 5 (five) minutes as needed for chest pain.   OXYGEN  Inhale 2 L into the lungs at bedtime.   pantoprazole  40 MG tablet Commonly known as: PROTONIX  Take 1 tablet (40 mg total) by mouth 2 (two) times daily.   propranolol  10 MG tablet Commonly known as: INDERAL  TAKE ONE TABLET BY MOUTH 2 TIMES A DAY   QC TUMERIC COMPLEX PO Take 1,000 mg by mouth daily.   rivaroxaban  20 MG Tabs tablet Commonly known as: XARELTO  Take 1 tablet (20 mg total) by mouth daily with supper.   rosuvastatin  20 MG  tablet Commonly known as: CRESTOR  TAKE ONE TABLET BY MOUTH EVERY DAY   tamsulosin 0.4 MG Caps capsule Commonly known as: FLOMAX Take 0.4 mg by mouth daily.   vitamin C 1000 MG tablet Take 1,000 mg by mouth daily.   zolpidem  10 MG tablet Commonly known as: AMBIEN  Take 10 mg by mouth at bedtime as needed for sleep.        Allergies:  Allergies  Allergen Reactions   Other Anaphylaxis, Shortness Of Breath and Swelling    Brazil nuts- will kill me   Spironolactone  Other (See Comments)    Mastodynia   Clarithromycin Nausea Only   Fenofibrate Micronized Other (See Comments)    Caused weakness/lethargy   Metoprolol  Other (See Comments)    Fatigue and malaise   Sulfur Hexafluoride Hives   Tricor [Fenofibrate] Other (See Comments)    Tired/weak   Fish Oil Rash   Meloxicam Other (See Comments)    Achy, fatigue   Metaxalone Other (See Comments)    Doesn't remember   Niaspan [Niacin] Other (See Comments)    Doesn't remember   Sulfa Antibiotics Other (See Comments)    Other reaction(s): Unknown   Sulfadiazine Hives and Rash    Past Medical History, Surgical history, Social history, and Family History were reviewed and updated.  Review of Systems: All other 10 point review of systems is negative.   Physical Exam:  height is 5' 9 (1.753 m) and weight is 184 lb (83.5 kg). His oral temperature is 98.2 F (36.8 C). His blood pressure is 152/58 (abnormal) and his pulse is 73. His respiration is 19 and oxygen  saturation is 99%.   Wt Readings from Last 3 Encounters:  06/14/24 184 lb (83.5 kg)  06/13/24 188 lb (85.3 kg)  12/15/23 186 lb 12.8 oz (84.7 kg)    Ocular: Sclerae unicteric, pupils equal, round and reactive to light Ear-nose-throat: Oropharynx clear, dentition fair Lymphatic: No cervical or supraclavicular adenopathy Lungs no rales or rhonchi, good excursion bilaterally Heart regular rate and rhythm, no murmur appreciated Abd soft, nontender, positive bowel  sounds MSK no focal spinal tenderness, no joint edema Neuro: non-focal, well-oriented, appropriate affect   Lab Results  Component Value Date   WBC 14.9 (H) 06/14/2024   HGB 15.1 06/14/2024   HCT 45.6 06/14/2024   MCV 87.9 06/14/2024   PLT 215 06/14/2024   Lab Results  Component Value Date   FERRITIN 135.1 11/03/2017   IRON 102 11/03/2017   IRONPCTSAT 27.5 11/03/2017   Lab Results  Component Value Date   RBC 5.19 06/14/2024   No results found for: KPAFRELGTCHN, LAMBDASER, KAPLAMBRATIO No results found for: IGGSERUM, IGA, IGMSERUM No results found for: TOTALPROTELP, ALBUMINELP, A1GS, A2GS, BETS, BETA2SER, GAMS, MSPIKE, SPEI   Chemistry  Component Value Date/Time   NA 143 12/15/2023 1356   NA 142 08/04/2021 1113   K 4.9 12/15/2023 1356   CL 108 12/15/2023 1356   CO2 23 12/15/2023 1356   BUN 22 12/15/2023 1356   BUN 21 08/04/2021 1113   CREATININE 1.27 (H) 12/15/2023 1356   CREATININE 1.05 02/12/2016 1008      Component Value Date/Time   CALCIUM  9.2 12/15/2023 1356   ALKPHOS 78 12/15/2023 1356   AST 22 12/15/2023 1356   ALT 16 12/15/2023 1356   BILITOT 0.7 12/15/2023 1356     Encounter Diagnoses  Name Primary?   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  CBC show mild elevation of his WBC-likely from his current prednisone  use. He denies fever or signs of infection CMP pending He will continue his same regimen. No changes.   He will continue follow up with cardiology regarding his blood pressure and bradycardia.    RTC 6 months APP, labs (CBC, CMP)  Lauraine HERO [place], PA-C 1/28/20262:42 PM  "

## 2024-06-15 ENCOUNTER — Ambulatory Visit: Payer: Self-pay | Admitting: Medical Oncology

## 2024-12-13 ENCOUNTER — Inpatient Hospital Stay

## 2024-12-13 ENCOUNTER — Inpatient Hospital Stay: Admitting: Medical Oncology
